# Patient Record
Sex: Female | Born: 1950
Health system: Southern US, Community
[De-identification: ages and names within clinical notes are randomized; demographics above are authoritative.]

## PROBLEM LIST (undated history)

## (undated) DIAGNOSIS — M1990 Unspecified osteoarthritis, unspecified site: Secondary | ICD-10-CM

## (undated) DIAGNOSIS — E119 Type 2 diabetes mellitus without complications: Secondary | ICD-10-CM

## (undated) DIAGNOSIS — I10 Essential (primary) hypertension: Secondary | ICD-10-CM

## (undated) DIAGNOSIS — Z8489 Family history of other specified conditions: Secondary | ICD-10-CM

## (undated) DIAGNOSIS — I890 Lymphedema, not elsewhere classified: Secondary | ICD-10-CM

## (undated) HISTORY — PX: CHOLECYSTECTOMY: SHX55

## (undated) HISTORY — DX: Unspecified osteoarthritis, unspecified site: M19.90

## (undated) HISTORY — PX: EYE SURGERY: SHX253

## (undated) HISTORY — PX: ABDOMINAL HYSTERECTOMY: SHX81

## (undated) HISTORY — PX: UTERINE FIBROID SURGERY: SHX826

## (undated) HISTORY — PX: OTHER SURGICAL HISTORY: SHX169

## (undated) HISTORY — PX: PARTIAL HYSTERECTOMY: SHX80

## (undated) HISTORY — PX: HERNIA REPAIR: SHX51

## (undated) HISTORY — DX: Type 2 diabetes mellitus without complications: E11.9

## (undated) LAB — COLOGUARD: Cologuard: NEGATIVE

---

## 2015-01-30 ENCOUNTER — Emergency Department (HOSPITAL_COMMUNITY)
Admission: EM | Admit: 2015-01-30 | Discharge: 2015-01-30 | Disposition: A | Discharge status: Home / Self Care | Payer: 59 | Source: Home / Self Care | Attending: Family Medicine | Admitting: Family Medicine

## 2015-01-30 ENCOUNTER — Encounter (HOSPITAL_COMMUNITY): Payer: Self-pay | Admitting: *Deleted

## 2015-01-30 DIAGNOSIS — I10 Essential (primary) hypertension: Secondary | ICD-10-CM

## 2015-01-30 HISTORY — DX: Essential (primary) hypertension: I10

## 2015-01-30 MED ORDER — LOSARTAN POTASSIUM-HCTZ 100-25 MG PO TABS: 1.0000 | ORAL_TABLET | Freq: Every day | ORAL | Status: DC

## 2015-01-30 NOTE — ED Provider Notes (Signed)
CSN: [id number]     Arrival date & time 01/30/15  35 History   First MD Initiated Contact with Patient 01/30/15 1816     Chief Complaint  Patient presents with  . Medication Refill   (Consider location/radiation/quality/duration/timing/severity/associated sxs/prior Treatment) Patient is a 64 y.o. female presenting with hypertension. The history is provided by the patient.  Hypertension This is a chronic problem. The current episode started yesterday. The problem has not changed since onset.Pertinent negatives include no chest pain, no abdominal pain, no headaches and no shortness of breath.    Past Medical History  Diagnosis Date  . Hypertension    History reviewed. No pertinent past surgical history. History reviewed. No pertinent family history. History  Substance Use Topics  . Smoking status: Never Smoker   . Smokeless tobacco: Not on file  . Alcohol Use: No   OB History    No data available     Review of Systems  Constitutional: Negative.   Respiratory: Negative for chest tightness and shortness of breath.   Cardiovascular: Negative for chest pain, palpitations and leg swelling.  Gastrointestinal: Negative for abdominal pain.  Neurological: Negative for headaches.    Allergies  Review of patient's allergies indicates no known allergies.  Home Medications   Prior to Admission medications   Medication Sig Start Date End Date Taking? Authorizing Provider  furosemide (LASIX) 40 MG tablet Take 40 mg by mouth.   Yes Historical Provider, MD  potassium chloride SA (K-DUR,KLOR-CON) 20 MEQ tablet Take 20 mEq by mouth 2 (two) times daily.   Yes Historical Provider, MD  losartan-hydrochlorothiazide (HYZAAR) 100-25 MG per tablet Take 1 tablet by mouth daily. 01/30/15   Billy Fischer, MD   BP 199/96 mmHg  Pulse 83  Temp(Src) 98.2 F (36.8 C) (Oral)  Resp 20  SpO2 95% Physical Exam  Constitutional: She is oriented to person, place, and time. She appears well-developed and  well-nourished. No distress.  Neck: Normal range of motion. Neck supple.  Cardiovascular: Normal rate, regular rhythm, normal heart sounds and intact distal pulses.   Pulmonary/Chest: Effort normal and breath sounds normal.  Lymphadenopathy:    She has no cervical adenopathy.  Neurological: She is alert and oriented to person, place, and time.  Skin: Skin is warm and dry.  Nursing note and vitals reviewed.   ED Course  Procedures (including critical care time) Labs Review Labs Reviewed - No data to display  Imaging Review No results found.   MDM   1. Essential hypertension        Billy Fischer, MD 01/30/15 [id number]

## 2015-01-30 NOTE — ED Notes (Signed)
Pt  Reports    She   Needs  A  Refill of  Her  meds  To include  Her  bp  meds    And  Her  Potassium      She  Has  No  Doctor  In  [place]      And  Her last  Dose  Of  meds  Was  Yesterday

## 2015-01-30 NOTE — Discharge Instructions (Signed)
See your doctor for further care of your bp.

## 2015-01-30 NOTE — ED Notes (Signed)
Pt   Requests  rx  Phoned  To    [facility] -  [place]     losartin    -  hctz       100 -25     Qty       30   1  Refill   eread  Back  And  verified

## 2015-02-15 ENCOUNTER — Ambulatory Visit (INDEPENDENT_AMBULATORY_CARE_PROVIDER_SITE_OTHER): Payer: 59 | Admitting: Family Medicine

## 2015-02-15 VITALS — BP 142/92 | HR 90 | Temp 98.8°F | Resp 17 | Ht 66.0 in | Wt 253.0 lb

## 2015-02-15 DIAGNOSIS — G8929 Other chronic pain: Secondary | ICD-10-CM | POA: Diagnosis not present

## 2015-02-15 DIAGNOSIS — I10 Essential (primary) hypertension: Secondary | ICD-10-CM | POA: Diagnosis not present

## 2015-02-15 DIAGNOSIS — M25561 Pain in right knee: Secondary | ICD-10-CM | POA: Diagnosis not present

## 2015-02-15 DIAGNOSIS — R609 Edema, unspecified: Secondary | ICD-10-CM

## 2015-02-15 DIAGNOSIS — M25569 Pain in unspecified knee: Secondary | ICD-10-CM

## 2015-02-15 DIAGNOSIS — B372 Candidiasis of skin and nail: Secondary | ICD-10-CM

## 2015-02-15 MED ORDER — FLUCONAZOLE 150 MG PO TABS: 150.0000 mg | ORAL_TABLET | Freq: Once | ORAL | Status: DC

## 2015-02-15 MED ORDER — POTASSIUM CHLORIDE CRYS ER 20 MEQ PO TBCR: 20.0000 meq | EXTENDED_RELEASE_TABLET | Freq: Every day | ORAL | Status: DC

## 2015-02-15 MED ORDER — FUROSEMIDE 40 MG PO TABS: 40.0000 mg | ORAL_TABLET | Freq: Every day | ORAL | Status: DC

## 2015-02-15 MED ORDER — NYSTATIN 100000 UNIT/GM EX CREA: 1.0000 "application " | TOPICAL_CREAM | Freq: Two times a day (BID) | CUTANEOUS | Status: DC

## 2015-02-15 NOTE — Progress Notes (Signed)
[facility] [address] [place] [address] [phone]  Date:  02/15/2015   Name:  Emily Wood   DOB:  1950/10/05   MRN:  [id number]  PCP:  [name], MD    Chief Complaint: Rash   History of Present Illness:  Emily Wood is a 64 y.o. very pleasant female patient who presents with the following:  She notes a rash under her breasts and in the inguinal folds for the last month.  It is itchy and seems to be turning her skin first red and now darker than normal.  She has had this in the past but it never this bad.   They recently moved to [facility] from [place] due to her job.  She will be seeing [place] for her PCP now  She does have some chronic health issues such as history of orthopedic surgery, HTN, and chronic LE swellilng  She was recently told to stop her lasix and her potassium pill- however she notes worsening of her chronic leg swelling since she stopped taking this.  Wonders if she can go back on  There are no active problems to display for this patient.   Past Medical History  Diagnosis Date  . Hypertension     No past surgical history on file.  History  Substance Use Topics  . Smoking status: Never Smoker   . Smokeless tobacco: Not on file  . Alcohol Use: No    No family history on file.  No Known Allergies  Medication list has been reviewed and updated.  Current Outpatient Prescriptions on File Prior to Visit  Medication Sig Dispense Refill  . furosemide (LASIX) 40 MG tablet Take 40 mg by mouth.    . losartan-hydrochlorothiazide (HYZAAR) 100-25 MG per tablet Take 1 tablet by mouth daily. 30 tablet 1  . potassium chloride SA (K-DUR,KLOR-CON) 20 MEQ tablet Take 20 mEq by mouth 2 (two) times daily.     No current facility-administered medications on file prior to visit.    Review of Systems:  As per HPI- otherwise negative.   Physical Examination: Filed Vitals:   02/15/15 1748  BP: 142/92  Pulse: 110  Temp:  98.8 F (37.1 C)  Resp: 17   Filed Vitals:   02/15/15 1748  Height: 5\' 6"  (1.676 m)  Weight: 253 lb (114.76 kg)   Body mass index is 40.85 kg/(m^2). Ideal Body Weight: Weight in (lb) to have BMI = 25: 154.6  GEN: WDWN, NAD, Non-toxic, A & O x 3, obese lady who appears well HEENT: Atraumatic, Normocephalic. Neck supple. No masses, No LAD. Ears and Nose: No external deformity. CV: RRR, No M/G/R. No JVD. No thrill. No extra heart sounds. PULM: CTA B, no wheezes, crackles, rhonchi. No retractions. No resp. distress. No accessory muscle use. EXTR: No c/c/e NEURO Normal gait.  PSYCH: Normally interactive. Conversant. Not depressed or anxious appearing.  Calm demeanor.  She has candida intertrigo of the inguinal folds and under her breasts  She also notes a likely lipoma over her left ribs which has been present for some time Swelling of her LE- the left ankle is quite swollen and shows signs of chronic edema.  She states this is baseline for her  Assessment and Plan: Candidal intertrigo - Plan: nystatin cream (MYCOSTATIN), fluconazole (DIFLUCAN) 150 MG tablet  Essential hypertension  Peripheral edema - Plan: furosemide (LASIX) 40 MG tablet, potassium chloride SA (K-DUR,KLOR-CON) 20 MEQ tablet  Chronic knee pain, right - Plan: Ambulatory  referral to Orthopedic Surgery  She has been trying to clear up her candida with supportive care at home.  Will treat with nystatin and diflucaqn Refilled medications for her HTN and chronic edema Referral to ortho to establish care She plans to see me in September for a recheck   Signed [name], MD

## 2015-02-15 NOTE — Patient Instructions (Signed)
You have candida intertrigo- this is a yeast infection of the skin, very common in skin folds in the summer months Try the nystatin cream, and take a diflucan once, repeat in one week Try to keep the area as cool and dry as you can! Let me know if not better and I will look forward to seeing you soon to establish care

## 2015-02-26 ENCOUNTER — Other Ambulatory Visit: Payer: Self-pay

## 2015-02-26 DIAGNOSIS — R609 Edema, unspecified: Secondary | ICD-10-CM

## 2015-02-26 MED ORDER — FUROSEMIDE 40 MG PO TABS: 40.0000 mg | ORAL_TABLET | Freq: Every day | ORAL | Status: DC

## 2015-02-26 MED ORDER — LOSARTAN POTASSIUM-HCTZ 100-25 MG PO TABS: 1.0000 | ORAL_TABLET | Freq: Every day | ORAL | Status: DC

## 2015-02-26 MED ORDER — POTASSIUM CHLORIDE CRYS ER 20 MEQ PO TBCR: 20.0000 meq | EXTENDED_RELEASE_TABLET | Freq: Every day | ORAL | Status: DC

## 2015-02-26 NOTE — Telephone Encounter (Signed)
[facility] faxed req for lasix, klor-con, and losartan/HCTZ. Sent the first two which Dr Lorelei Pont had sent in Rxs for locally. Dr Lorelei Pont, I'm sure you want to Rx the losartan also from your notes, but since you have not written before, I need to get OK from you.

## 2015-04-03 ENCOUNTER — Other Ambulatory Visit: Payer: Self-pay

## 2015-04-03 MED ORDER — LOSARTAN POTASSIUM-HCTZ 100-25 MG PO TABS: 1.0000 | ORAL_TABLET | Freq: Every day | ORAL | Status: DC

## 2015-04-18 ENCOUNTER — Telehealth: Payer: Self-pay | Admitting: Family Medicine

## 2015-04-18 NOTE — Telephone Encounter (Signed)
Returning patients call about her mammogram.  Left a message for her to return my call

## 2015-04-18 NOTE — Telephone Encounter (Signed)
Spoke with patient and discussed scheduling a mammogram.  She will call and schedule it at a time that is convient for her.

## 2015-04-19 ENCOUNTER — Ambulatory Visit (INDEPENDENT_AMBULATORY_CARE_PROVIDER_SITE_OTHER): Payer: 59

## 2015-04-19 ENCOUNTER — Ambulatory Visit (INDEPENDENT_AMBULATORY_CARE_PROVIDER_SITE_OTHER): Payer: 59 | Admitting: Family Medicine

## 2015-04-19 VITALS — BP 170/90 | HR 95 | Temp 98.6°F | Ht 65.75 in | Wt 254.0 lb

## 2015-04-19 DIAGNOSIS — M25551 Pain in right hip: Secondary | ICD-10-CM

## 2015-04-19 DIAGNOSIS — M7061 Trochanteric bursitis, right hip: Secondary | ICD-10-CM

## 2015-04-19 DIAGNOSIS — Z130 Encounter for screening for diseases of the blood and blood-forming organs and certain disorders involving the immune mechanism: Secondary | ICD-10-CM | POA: Diagnosis not present

## 2015-04-19 DIAGNOSIS — R600 Localized edema: Secondary | ICD-10-CM | POA: Diagnosis not present

## 2015-04-19 DIAGNOSIS — I10 Essential (primary) hypertension: Secondary | ICD-10-CM

## 2015-04-19 DIAGNOSIS — Z131 Encounter for screening for diabetes mellitus: Secondary | ICD-10-CM

## 2015-04-19 MED ORDER — METOPROLOL TARTRATE 50 MG PO TABS: 50.0000 mg | ORAL_TABLET | Freq: Two times a day (BID) | ORAL | Status: DC

## 2015-04-19 MED ORDER — METHYLPREDNISOLONE ACETATE 80 MG/ML IJ SUSP: 40.0000 mg | Freq: Once | INTRAMUSCULAR | Status: DC

## 2015-04-19 NOTE — Patient Instructions (Signed)
I hope that the injection your got today (depo-medrol and lidocaine) will help with your hip pain Do mention this to your orthopedist when you see them for follow-up next week We will be in touch with your labs asap Please add metoprolol to your blood pressure regimen- start with 1/2 tablet (25mg ) twice a day.  Assuming you do not have any side effects we can go to a whole tablet  Please see me in about one month for a recheck- sooner if any concenns

## 2015-04-19 NOTE — Progress Notes (Signed)
[facility] [address] [place] [address] [phone]  Date:  04/19/2015   Name:  Emily Wood   DOB:  [birth year]   MRN:  [id number]  PCP:  [name], MD    Chief Complaint: Hip Pain; Hypertension; and Stress   History of Present Illness:  Emily Wood is a 64 y.o. very pleasant female patient who presents with the following:  She has noted a pain in her right hip for about one month- it is worst in the morning, makes it really hard for her to get out of bed. It will then improve once she gets going, and her hip does not hurt much during the day.   She is not aware of any injury or overuse  She did see ortho for her knee OA- she got a steroid shot in her knee at the end of August.  She has a follow-up on 10/4.  The shot did help her a lot.  She notes good improvement  She has been faithfully taking her BP medications.  She does not have any blurred vision, she is not having any unusual HA.  However she is concerned that her BP continues to run high.  In addition, she has significant chronic pedal edema.  Admits that she does not wear compression stockings.  She does use laxix and HCTZ  No recent labs She is not aware of any other cardiac history   Pulse Readings from Last 3 Encounters:  04/19/15 95  02/15/15 90  01/30/15 83     BP Readings from Last 3 Encounters:  04/19/15 170/90  02/15/15 142/92  01/30/15 199/96     Patient Active Problem List   Diagnosis Date Noted  . Essential hypertension 02/15/2015  . Peripheral edema 02/15/2015  . Chronic knee pain 02/15/2015    Past Medical History  Diagnosis Date  . Hypertension     No past surgical history on file.  Social History  Substance Use Topics  . Smoking status: Never Smoker   . Smokeless tobacco: Never Used  . Alcohol Use: No    No family history on file.  No Known Allergies  Medication list has been reviewed and updated.  Current Outpatient Prescriptions  on File Prior to Visit  Medication Sig Dispense Refill  . aspirin 81 MG tablet Take 81 mg by mouth daily.    . furosemide (LASIX) 40 MG tablet Take 1 tablet (40 mg total) by mouth daily. 90 tablet 1  . losartan-hydrochlorothiazide (HYZAAR) 100-25 MG per tablet Take 1 tablet by mouth daily. 90 tablet 3  . potassium chloride SA (K-DUR,KLOR-CON) 20 MEQ tablet Take 1 tablet (20 mEq total) by mouth daily. 90 tablet 1   No current facility-administered medications on file prior to visit.    Review of Systems:  As per HPI- otherwise negative.   Physical Examination: Filed Vitals:   04/19/15 1809  BP: 170/90  Pulse:   Temp:    Filed Vitals:   04/19/15 1805  Height: 5' 5.75" (1.67 m)  Weight: 254 lb (115.214 kg)   Body mass index is 41.31 kg/(m^2). Ideal Body Weight: Weight in (lb) to have BMI = 25: 153.4  GEN: WDWN, NAD, Non-toxic, A & O x 3, obese, looks well HEENT: Atraumatic, Normocephalic. Neck supple. No masses, No LAD. Ears and Nose: No external deformity. CV: RRR, No M/G/R. No JVD. No thrill. No extra heart sounds. PULM: CTA B, no wheezes, crackles, rhonchi. No retractions. No resp. distress. No  accessory muscle use. ABD: S, NT, ND, +BS. No rebound. No HSM. EXTR: No c/c NEURO Normal gait.  PSYCH: Normally interactive. Conversant. Not depressed or anxious appearing.  Calm demeanor.  She has chronic appearing edema of both LE to the knees.  No pitting noted Right hip: She has tenderness over the right greater trochanter.  She does appear to have a lipoma under the skin just superior to the trochanter, but this is non- tender.  No pain with movement of the hip joint.  No redness or heat  [facility] reading (PRIMARY) by  Dr. Lorelei Pont. Right FOY:DXAJOINO for any acute disease    DG HIP (WITH OR WITHOUT PELVIS) 2-3V RIGHT  COMPARISON: None.  FINDINGS: No acute fracture, subluxation or dislocation.  The joint space is unremarkable. No focal bony lesions are  identified. IMPRESSION: Negative.  VC obtained.  Prepped right hip at point of maximum tenderness over GT with betadine and alcohol, used ethyl chloride spray for anesthesia Then injected 24ml of 1% lido and 40mg  of depomedrol into right trochanteric bursa.  Pt tolerated well Assessment and Plan: Trochanteric bursitis of right hip - Plan: methylPREDNISolone acetate (DEPO-MEDROL) injection 40 mg  Pain in right hip - Plan: DG HIP UNILAT W OR W/O PELVIS 2-3 VIEWS RIGHT  Screening for diabetes mellitus - Plan: Hemoglobin A1c  Screening for deficiency anemia - Plan: CBC  Essential hypertension - Plan: Comprehensive metabolic panel, metoprolol (LOPRESSOR) 50 MG tablet, DISCONTINUED: metoprolol (LOPRESSOR) 50 MG tablet  Pedal edema - Plan: Ambulatory referral to Vascular Surgery  Injection for trochanteric bursitis today.  We hope this will help with her hip pain.  She is seeing ortho next week.  Films negative Await labs as above BP is not controlled- avoid CCB due to her edema.  Will add a BB, starting with a low dose Referral to vascular for her chronic edema- suspect is due to venous insuf and compression will be helpful  Meds ordered this encounter  Medications  . DISCONTD: metoprolol (LOPRESSOR) 50 MG tablet    Sig: Take 1 tablet (50 mg total) by mouth 2 (two) times daily.    Dispense:  180 tablet    Refill:  3  . metoprolol (LOPRESSOR) 50 MG tablet    Sig: Take 1 tablet (50 mg total) by mouth 2 (two) times daily.    Dispense:  60 tablet    Refill:  3  . methylPREDNISolone acetate (DEPO-MEDROL) injection 40 mg    Sig:      Signed [name], MD

## 2015-04-20 LAB — COMPREHENSIVE METABOLIC PANEL
ALBUMIN: 4.3 g/dL (ref 3.6–5.1)
ALT: 21 U/L (ref 6–29)
AST: 23 U/L (ref 10–35)
Alkaline Phosphatase: 118 U/L (ref 33–130)
BUN: 19 mg/dL (ref 7–25)
CO2: 25 mmol/L (ref 20–31)
Calcium: 9.4 mg/dL (ref 8.6–10.4)
Chloride: 96 mmol/L — ABNORMAL LOW (ref 98–110)
Creat: 0.96 mg/dL (ref 0.50–0.99)
Glucose, Bld: 132 mg/dL — ABNORMAL HIGH (ref 65–99)
POTASSIUM: 5.4 mmol/L — AB (ref 3.5–5.3)
Sodium: 135 mmol/L (ref 135–146)
TOTAL PROTEIN: 8.4 g/dL — AB (ref 6.1–8.1)
Total Bilirubin: 0.8 mg/dL (ref 0.2–1.2)

## 2015-04-20 LAB — CBC
HCT: 36.1 % (ref 36.0–46.0)
Hemoglobin: 12.4 g/dL (ref 12.0–15.0)
MCH: 28.6 pg (ref 26.0–34.0)
MCHC: 34.3 g/dL (ref 30.0–36.0)
MCV: 83.2 fL (ref 78.0–100.0)
MPV: 9.6 fL (ref 8.6–12.4)
Platelets: 428 10*3/uL — ABNORMAL HIGH (ref 150–400)
RBC: 4.34 MIL/uL (ref 3.87–5.11)
RDW: 12.8 % (ref 11.5–15.5)
WBC: 9.7 10*3/uL (ref 4.0–10.5)

## 2015-04-20 LAB — HEMOGLOBIN A1C
HEMOGLOBIN A1C: 9.5 % — AB (ref <5.7)
MEAN PLASMA GLUCOSE: 226 mg/dL — AB (ref <117)

## 2015-04-22 ENCOUNTER — Telehealth: Payer: Self-pay

## 2015-04-22 ENCOUNTER — Encounter: Payer: Self-pay | Admitting: Family Medicine

## 2015-04-22 DIAGNOSIS — E875 Hyperkalemia: Secondary | ICD-10-CM

## 2015-04-22 DIAGNOSIS — E119 Type 2 diabetes mellitus without complications: Secondary | ICD-10-CM

## 2015-04-22 MED ORDER — METFORMIN HCL 500 MG PO TABS: 500.0000 mg | ORAL_TABLET | Freq: Two times a day (BID) | ORAL | Status: DC

## 2015-04-22 NOTE — Telephone Encounter (Signed)
Pt requesting call back from dr copland re she is not any better!   Best phone [phone]

## 2015-04-22 NOTE — Telephone Encounter (Signed)
Assessment and Plan: Trochanteric bursitis of right hip - Plan: methylPREDNISolone acetate (DEPO-MEDROL) injection 40 mg  Pain in right hip - Plan: DG HIP UNILAT W OR W/O PELVIS 2-3 VIEWS RIGHT  Screening for diabetes mellitus - Plan: Hemoglobin A1c  Screening for deficiency anemia - Plan: CBC  Essential hypertension - Plan: Comprehensive metabolic panel, metoprolol (LOPRESSOR) 50 MG tablet, DISCONTINUED: metoprolol (LOPRESSOR) 50 MG tablet  Pedal edema - Plan: Ambulatory referral to Vascular Surgery  Injection for trochanteric bursitis today. We hope this will help with her hip pain. She is seeing ortho next week. Films negative Await labs as above BP is not controlled- avoid CCB due to her edema. Will add a BB, starting with a low dose Referral to vascular for her chronic edema- suspect is due to venous insuf and compression will be helpful

## 2015-04-22 NOTE — Telephone Encounter (Signed)
Called her back- needed to call her to discuss her recent labs in any case.  She is a pt at [facility],  Dr. Cay Schillings.  She is actually seeing him tomorrow.  Advised her to let him know that her hip has been bothering her, that injection for bursitis did not help.  Advised her that he may not be able to address this on the same day - he may have to bring her back She does have DM- start on metformin 500 mg twice a day. Go over the ADA website for diet and self- care information. Please see me in 2 months to follow-up K slightly high- she is taking K replacement.  Asked her to hold this for 2-3 days and RTC for a K level only- she will do os  Results for orders placed or performed in visit on 04/19/15  CBC  Result Value Ref Range   WBC 9.7 4.0 - 10.5 K/uL   RBC 4.34 3.87 - 5.11 MIL/uL   Hemoglobin 12.4 12.0 - 15.0 g/dL   HCT 36.1 36.0 - 46.0 %   MCV 83.2 78.0 - 100.0 fL   MCH 28.6 26.0 - 34.0 pg   MCHC 34.3 30.0 - 36.0 g/dL   RDW 12.8 11.5 - 15.5 %   Platelets 428 (H) 150 - 400 K/uL   MPV 9.6 8.6 - 12.4 fL  Comprehensive metabolic panel  Result Value Ref Range   Sodium 135 135 - 146 mmol/L   Potassium 5.4 (H) 3.5 - 5.3 mmol/L   Chloride 96 (L) 98 - 110 mmol/L   CO2 25 20 - 31 mmol/L   Glucose, Bld 132 (H) 65 - 99 mg/dL   BUN 19 7 - 25 mg/dL   Creat 0.96 0.50 - 0.99 mg/dL   Total Bilirubin 0.8 0.2 - 1.2 mg/dL   Alkaline Phosphatase 118 33 - 130 U/L   AST 23 10 - 35 U/L   ALT 21 6 - 29 U/L   Total Protein 8.4 (H) 6.1 - 8.1 g/dL   Albumin 4.3 3.6 - 5.1 g/dL   Calcium 9.4 8.6 - 10.4 mg/dL  Hemoglobin A1c  Result Value Ref Range   Hgb A1c MFr Bld 9.5 (H) <5.7 %   Mean Plasma Glucose 226 (H) <117 mg/dL

## 2015-04-25 ENCOUNTER — Telehealth: Payer: Self-pay

## 2015-04-25 NOTE — Telephone Encounter (Signed)
Pt has questions about her medication bp meds is causing her headaches

## 2015-04-26 ENCOUNTER — Other Ambulatory Visit (INDEPENDENT_AMBULATORY_CARE_PROVIDER_SITE_OTHER): Payer: 59

## 2015-04-26 DIAGNOSIS — E875 Hyperkalemia: Secondary | ICD-10-CM | POA: Diagnosis not present

## 2015-04-26 NOTE — Progress Notes (Signed)
Pt here for lab draw only  

## 2015-04-26 NOTE — Telephone Encounter (Signed)
Left message for pt to call back  °

## 2015-04-27 LAB — POTASSIUM: Potassium: 3.7 mmol/L (ref 3.5–5.3)

## 2015-04-29 ENCOUNTER — Telehealth: Payer: Self-pay

## 2015-04-29 NOTE — Telephone Encounter (Signed)
Called her back- her repeat K is normal, I would recommend that she take the metformin as her A1c is 9.5% She understands and will do so- we will plan to recheck in about 3 months Also, she is taking just 1/2 of her lopressor due to headache with a whole pill.  She will continue this regimen and will try to check her BP so we can see how she is running on this dose

## 2015-04-29 NOTE — Telephone Encounter (Signed)
Pt is wanting to talk with her provider about waiting on the diabetes meds and giving it a month to watch what she its and then see what glucose is and see if she needs the meds

## 2015-05-01 ENCOUNTER — Ambulatory Visit: Payer: 59 | Admitting: Family Medicine

## 2015-05-08 ENCOUNTER — Ambulatory Visit (INDEPENDENT_AMBULATORY_CARE_PROVIDER_SITE_OTHER): Payer: 59 | Admitting: Family Medicine

## 2015-05-08 ENCOUNTER — Encounter: Payer: Self-pay | Admitting: Family Medicine

## 2015-05-08 ENCOUNTER — Telehealth: Payer: Self-pay | Admitting: Family Medicine

## 2015-05-08 VITALS — BP 152/88 | HR 71 | Temp 98.1°F | Resp 16 | Ht 65.75 in | Wt 246.4 lb

## 2015-05-08 DIAGNOSIS — E1165 Type 2 diabetes mellitus with hyperglycemia: Secondary | ICD-10-CM

## 2015-05-08 DIAGNOSIS — Z5181 Encounter for therapeutic drug level monitoring: Secondary | ICD-10-CM | POA: Diagnosis not present

## 2015-05-08 DIAGNOSIS — Z23 Encounter for immunization: Secondary | ICD-10-CM

## 2015-05-08 DIAGNOSIS — IMO0001 Reserved for inherently not codable concepts without codable children: Secondary | ICD-10-CM

## 2015-05-08 DIAGNOSIS — Z1231 Encounter for screening mammogram for malignant neoplasm of breast: Secondary | ICD-10-CM

## 2015-05-08 DIAGNOSIS — M25551 Pain in right hip: Secondary | ICD-10-CM

## 2015-05-08 DIAGNOSIS — I10 Essential (primary) hypertension: Secondary | ICD-10-CM

## 2015-05-08 LAB — BASIC METABOLIC PANEL
BUN: 22 mg/dL (ref 7–25)
CHLORIDE: 101 mmol/L (ref 98–110)
CO2: 27 mmol/L (ref 20–31)
CREATININE: 0.98 mg/dL (ref 0.50–0.99)
Calcium: 9.4 mg/dL (ref 8.6–10.4)
Glucose, Bld: 91 mg/dL (ref 65–99)
Potassium: 3.9 mmol/L (ref 3.5–5.3)
Sodium: 138 mmol/L (ref 135–146)

## 2015-05-08 LAB — GLUCOSE, POCT (MANUAL RESULT ENTRY): POC GLUCOSE: 96 mg/dL (ref 70–99)

## 2015-05-08 MED ORDER — TRAMADOL HCL 50 MG PO TABS: 50.0000 mg | ORAL_TABLET | Freq: Three times a day (TID) | ORAL | Status: DC | PRN

## 2015-05-08 MED ORDER — CYCLOBENZAPRINE HCL 10 MG PO TABS: 10.0000 mg | ORAL_TABLET | Freq: Two times a day (BID) | ORAL | Status: DC | PRN

## 2015-05-08 NOTE — Progress Notes (Addendum)
[facility] [address] [place] [address] [phone]  Date:  05/08/2015   Name:  Emily Wood   DOB:  Sep 09, 1950   MRN:  [id number]  PCP:  [name], MD    Chief Complaint: right hip pain   History of Present Illness:  Emily Wood is a 64 y.o. very pleasant female patient who presents with the following:  I saw this pt about 3 weeks ago for presumed trochanteric bursitis.  Did a steroid injection but it did not help at all X-ray negative at that visit  DG HIP (WITH OR WITHOUT PELVIS) 2-3V RIGHT COMPARISON: None. FINDINGS: No acute fracture, subluxation or dislocation. The joint space is unremarkable. No focal bony lesions are identified. IMPRESSION: Negative.  We also dx her with DM at last visit, started on metformin.  She is a pt of Dr. Cay Schillings at [facility] ortho She states that today her right hip pain was a lot worse and she was having a hard time walking.  She does have some sort of lump that has been present for a few years  She does not have an appt to see Dr. Cay Schillings in follow-up; she seems confused about if she has the proper referral for this FU appt or not  Patient Active Problem List   Diagnosis Date Noted  . Essential hypertension 02/15/2015  . Peripheral edema 02/15/2015  . Chronic knee pain 02/15/2015    Past Medical History  Diagnosis Date  . Hypertension     No past surgical history on file.  Social History  Substance Use Topics  . Smoking status: Never Smoker   . Smokeless tobacco: Never Used  . Alcohol Use: No    No family history on file.  No Known Allergies  Medication list has been reviewed and updated.  Current Outpatient Prescriptions on File Prior to Visit  Medication Sig Dispense Refill  . aspirin 81 MG tablet Take 81 mg by mouth daily.    . furosemide (LASIX) 40 MG tablet Take 1 tablet (40 mg total) by mouth daily. 90 tablet 1  . metFORMIN (GLUCOPHAGE) 500 MG tablet Take 1 tablet  (500 mg total) by mouth 2 (two) times daily with a meal. 180 tablet 3  . metoprolol (LOPRESSOR) 50 MG tablet Take 1 tablet (50 mg total) by mouth 2 (two) times daily. 60 tablet 3  . losartan-hydrochlorothiazide (HYZAAR) 100-25 MG per tablet Take 1 tablet by mouth daily. (Patient not taking: Reported on 05/08/2015) 90 tablet 3  . potassium chloride SA (K-DUR,KLOR-CON) 20 MEQ tablet Take 1 tablet (20 mEq total) by mouth daily. (Patient not taking: Reported on 05/08/2015) 90 tablet 1   Current Facility-Administered Medications on File Prior to Visit  Medication Dose Route Frequency Provider Last Rate Last Dose  . methylPREDNISolone acetate (DEPO-MEDROL) injection 40 mg  40 mg Intra-articular Once Darreld Mclean, MD        Review of Systems:  As per HPI- otherwise negative.  Physical Examination: Filed Vitals:   05/08/15 1438  BP: 152/88  Pulse: 71  Temp: 98.1 F (36.7 C)  Resp: 16   Filed Vitals:   05/08/15 1438  Height: 5' 5.75" (1.67 m)  Weight: 246 lb 6.4 oz (111.766 kg)   Body mass index is 40.08 kg/(m^2). Ideal Body Weight: Weight in (lb) to have BMI = 25: 153.4  GEN: WDWN, NAD, Non-toxic, A & O x 3, obese, looks well HEENT: Atraumatic, Normocephalic. Neck supple. No masses, No LAD. Ears and  Nose: No external deformity. CV: RRR, No M/G/R. No JVD. No thrill. No extra heart sounds. PULM: CTA B, no wheezes, crackles, rhonchi. No retractions. No resp. distress. No accessory muscle use. ABD: S, NT, ND EXTR: No c/c/e NEURO favoring the right hip a lot today PSYCH: Normally interactive. Conversant. Not depressed or anxious appearing.  Calm demeanor.  Right hip: she has a 1/2 softball sized likely lipoma over the lateral right hip.  She is still tender over the greater trochanter, and over the superiolateral buttock.  No pain with internal and external rotation of the hip. The actual hip joint is not painful to her  Results for orders placed or performed in visit on 05/08/15   POCT glucose (manual entry)  Result Value Ref Range   POC Glucose 96 70 - 99 mg/dl    Assessment and Plan: Right hip pain - Plan: traMADol (ULTRAM) 50 MG tablet, cyclobenzaprine (FLEXERIL) 10 MG tablet  Flu vaccine need - Plan: Flu Vaccine QUAD 36+ mos IM  Uncontrolled type 2 diabetes mellitus without complication, without long-term current use of insulin ([place]) - Plan: POCT glucose (manual entry), Pneumococcal polysaccharide vaccine 23-valent greater than or equal to 2yo subcutaneous/IM  Medication monitoring encounter - Plan: Basic metabolic panel  Her glucose control seems better Too early to recheck her A1c but will do this soon Her right hip is really bothering her today Recent x-rays normal.   rx for tramadol and flexeril to use as needed, and make her an appt with her ortho this coming Saturday Discussed risk of sedation with meds today, more risk if used in combination Also checked a BMP to see how her K looks today- may be able to add back in another BP medication Signed [name], MD  Received her BMP on 10/20- it looks fine.  Right now she is just taking lasix and lopressor, not taking her losartan/hctz or her k dur.  Her K looks ok, but her BP is running a bit high.  Called and [facility]- will try back later.  May want to add back in losartan  Called and [facility] again 10/24 Spoke with her on 10/25. She has not noted any worsening of her edema since stopping the hctz.  Will add back 50 mg of losartan. She will watch her BP Discussed diet management of her DM.  She is really working on this and we hope to see an improved A1c at our next visit in January  BP Readings from Last 3 Encounters:  05/08/15 152/88  04/19/15 170/90  02/15/15 142/92   Lab Results  Component Value Date   HGBA1C 9.5* 04/19/2015    Results for orders placed or performed in visit on 96/22/29  Basic metabolic panel  Result Value Ref Range   Sodium 138 135 - 146 mmol/L   Potassium 3.9 3.5 - 5.3  mmol/L   Chloride 101 98 - 110 mmol/L   CO2 27 20 - 31 mmol/L   Glucose, Bld 91 65 - 99 mg/dL   BUN 22 7 - 25 mg/dL   Creat 0.98 0.50 - 0.99 mg/dL   Calcium 9.4 8.6 - 10.4 mg/dL  POCT glucose (manual entry)  Result Value Ref Range   POC Glucose 96 70 - 99 mg/dl

## 2015-05-08 NOTE — Patient Instructions (Signed)
Use the tramadol as needed for pain- this can make you a bit sleepy! You can also use the flexeril as needed for pain but I would recommend that you take this just at night.    Your glucose looks good today!   You have an appt with Dr. Cay Schillings this Saturday 10/22 at 8:30 am Please see me in about 3 months to recheck your diabetes

## 2015-05-08 NOTE — Telephone Encounter (Signed)
I had seen her about 3 weeks ago and we tried an injection for trochanteric bursitis. She had called back a few days later because it did not seem to help- I recommended that she discuss with her ortho MD.   Hulen Skains her but did not reach. LMOM; would she like to come in, see Dr. Cay Schillings or perhaps I can call in tramadol

## 2015-05-08 NOTE — Telephone Encounter (Signed)
Patient states that she is at work and her hip is in terrible pain. She wants to speak with Dr. Lorelei Pont about this.   [phone]

## 2015-05-08 NOTE — Telephone Encounter (Signed)
Pt ended up coming in to be seen today 

## 2015-05-09 ENCOUNTER — Telehealth: Payer: Self-pay

## 2015-05-09 NOTE — Telephone Encounter (Signed)
rxs called in and pt notified

## 2015-05-09 NOTE — Telephone Encounter (Signed)
Patient needs her scripts from yesterday [facility].  They don't have them according to patient.  States she was given the scripts on paper and requested we fax them for her.   [phone] (M)

## 2015-05-13 ENCOUNTER — Telehealth: Payer: Self-pay | Admitting: Family Medicine

## 2015-05-13 NOTE — Addendum Note (Signed)
Addended by: Wyatt Haste on: 05/13/2015 10:18 AM   Modules accepted: Orders

## 2015-05-14 ENCOUNTER — Encounter: Payer: Self-pay | Admitting: Family Medicine

## 2015-05-14 ENCOUNTER — Telehealth: Payer: Self-pay

## 2015-05-14 MED ORDER — LOSARTAN POTASSIUM 50 MG PO TABS: 50.0000 mg | ORAL_TABLET | Freq: Every day | ORAL | Status: DC

## 2015-05-14 NOTE — Telephone Encounter (Signed)
Taken care of, documented on recent OV note

## 2015-05-14 NOTE — Telephone Encounter (Signed)
Pt states Dr Lorelei Pont had called and left a message regarding her bp pills but she didn't quite understand what was said. Please call [phone]

## 2015-05-14 NOTE — Addendum Note (Signed)
Addended by: [name] C on: 05/14/2015 12:31 PM   Modules accepted: Orders, Medications

## 2015-05-15 NOTE — Addendum Note (Signed)
Addended by: Wyatt Haste on: 05/15/2015 09:18 AM   Modules accepted: Orders

## 2015-05-17 ENCOUNTER — Telehealth: Payer: Self-pay

## 2015-05-17 DIAGNOSIS — I10 Essential (primary) hypertension: Secondary | ICD-10-CM

## 2015-05-17 DIAGNOSIS — E119 Type 2 diabetes mellitus without complications: Secondary | ICD-10-CM

## 2015-05-17 NOTE — Telephone Encounter (Signed)
Patient is calling to see if we received a fax from [facility]. Patient needs a refill or metformin,lasartan, and metroprozol. Patient states that she needs her medication today because of insurance complication but I informed patient that it takes 24-72 hours for approval Patient would like a phone call! [phone]

## 2015-05-20 MED ORDER — METOPROLOL TARTRATE 50 MG PO TABS: 50.0000 mg | ORAL_TABLET | Freq: Two times a day (BID) | ORAL | Status: DC

## 2015-05-20 MED ORDER — METFORMIN HCL 500 MG PO TABS: 500.0000 mg | ORAL_TABLET | Freq: Two times a day (BID) | ORAL | Status: DC

## 2015-05-20 MED ORDER — LOSARTAN POTASSIUM 50 MG PO TABS: 50.0000 mg | ORAL_TABLET | Freq: Every day | ORAL | Status: DC

## 2015-05-20 NOTE — Telephone Encounter (Signed)
Sent in RFs. Sent a yr of RFs on metoprolol too since Dr Lorelei Pont filled all her other chronic meds for a year. Notified pt on VM.

## 2015-05-22 ENCOUNTER — Other Ambulatory Visit: Payer: Self-pay | Admitting: *Deleted

## 2015-05-22 ENCOUNTER — Encounter: Payer: Self-pay | Admitting: Vascular Surgery

## 2015-05-22 DIAGNOSIS — R600 Localized edema: Secondary | ICD-10-CM

## 2015-05-29 ENCOUNTER — Telehealth: Payer: Self-pay

## 2015-05-29 NOTE — Telephone Encounter (Signed)
Caren Griffins from [facility] called yesterday to get a [facility] referral for the patient's upcoming office visit.  The patient's insurance is showing as termed on 05/20/15, which I relayed to a rep in the office today.  She said she would contact the patient to ask if she had new insurance.  Sending this message to referrals for documentation purposes.

## 2015-06-21 ENCOUNTER — Encounter: Payer: Self-pay | Admitting: Vascular Surgery

## 2015-06-28 ENCOUNTER — Ambulatory Visit: Payer: 59

## 2015-06-28 ENCOUNTER — Encounter: Payer: Self-pay | Admitting: Vascular Surgery

## 2015-06-28 ENCOUNTER — Ambulatory Visit (HOSPITAL_COMMUNITY)
Admission: RE | Admit: 2015-06-28 | Discharge: 2015-06-28 | Disposition: A | Discharge status: Home / Self Care | Payer: 59 | Source: Ambulatory Visit | Attending: Vascular Surgery | Admitting: Vascular Surgery

## 2015-06-28 ENCOUNTER — Ambulatory Visit (INDEPENDENT_AMBULATORY_CARE_PROVIDER_SITE_OTHER): Payer: 59 | Admitting: Vascular Surgery

## 2015-06-28 VITALS — BP 172/70 | HR 69 | Temp 97.8°F | Resp 16 | Ht 65.5 in | Wt 218.0 lb

## 2015-06-28 DIAGNOSIS — R600 Localized edema: Secondary | ICD-10-CM | POA: Diagnosis not present

## 2015-06-28 DIAGNOSIS — I872 Venous insufficiency (chronic) (peripheral): Secondary | ICD-10-CM | POA: Diagnosis not present

## 2015-06-28 DIAGNOSIS — R609 Edema, unspecified: Secondary | ICD-10-CM

## 2015-06-28 NOTE — Progress Notes (Signed)
Referred by:  Darreld Mclean, MD [address] [place], [place] [address]  Reason for referral: B leg swelling   History of Present Illness  Emily Wood is a 64 y.o. ([birth year]) female who presents with chief complaint: B leg swelling.  Patient notes, onset of swelling year ago without obvious trigger.  The patient's symptoms include: L thigh swelling and new rash on lateral thigh.  She notes some proximal thigh burning sensation mixed with para/anesthsia.  The patient has had no history of DVT, mp history of pregnancy, mp history of varicose vein, mp history of venous stasis ulcers, mp history of  Lymphedema and mp history of skin changes in lower legs.  There is mp family history of venous disorders.  The patient has never used compression stockings in the past.   Past Medical History  Diagnosis Date  . Hypertension   . Diabetes mellitus without complication [facility])    Surgical History: denies prior operations  Social History   Social History  . Marital Status: Married    Spouse Name: N/A  . Number of Children: N/A  . Years of Education: N/A   Occupational History  . Not on file.   Social History Main Topics  . Smoking status: Never Smoker   . Smokeless tobacco: Never Used  . Alcohol Use: No  . Drug Use: No  . Sexual Activity: Not on file   Other Topics Concern  . Not on file   Social History Narrative    Family History: unable to detail parents' medical history   Current Outpatient Prescriptions  Medication Sig Dispense Refill  . aspirin 81 MG tablet Take 81 mg by mouth daily.    . cyclobenzaprine (FLEXERIL) 10 MG tablet Take 1 tablet (10 mg total) by mouth 2 (two) times daily as needed for muscle spasms. 30 tablet 0  . furosemide (LASIX) 40 MG tablet Take 1 tablet (40 mg total) by mouth daily. 90 tablet 1  . losartan (COZAAR) 50 MG tablet Take 1 tablet (50 mg total) by mouth daily. 90 tablet 3  . metFORMIN (GLUCOPHAGE) 500 MG tablet Take 1 tablet (500  mg total) by mouth 2 (two) times daily with a meal. 180 tablet 3  . potassium chloride SA (K-DUR,KLOR-CON) 20 MEQ tablet Take 1 tablet (20 mEq total) by mouth daily. 90 tablet 1  . metoprolol (LOPRESSOR) 50 MG tablet Take 1 tablet (50 mg total) by mouth 2 (two) times daily. 180 tablet 3  . traMADol (ULTRAM) 50 MG tablet Take 1 tablet (50 mg total) by mouth every 8 (eight) hours as needed. May take 2 if needed (Patient not taking: Reported on 06/28/2015) 40 tablet 0   Current Facility-Administered Medications  Medication Dose Route Frequency Provider Last Rate Last Dose  . methylPREDNISolone acetate (DEPO-MEDROL) injection 40 mg  40 mg Intra-articular Once Gay Filler Copland, MD        No Known Allergies   REVIEW OF SYSTEMS:  (Positives checked otherwise negative)  CARDIOVASCULAR:   [ ]  chest pain,  [ ]  chest pressure,  [ ]  palpitations,  [ ]  shortness of breath when laying flat,  [ ]  shortness of breath with exertion,   [ ]  pain in feet when walking,  [ ]  pain in feet when laying flat, [ ]  history of blood clot in veins (DVT),  [ ]  history of phlebitis,  [ ]  swelling in legs,  [ ]  varicose veins  PULMONARY:   [ ]  productive cough,  [ ]   asthma,  [ ]  wheezing  NEUROLOGIC:   [ ]  weakness in arms or legs,  [ ]  numbness in arms or legs,  [ ]  difficulty speaking or slurred speech,  [ ]  temporary loss of vision in one eye,  [ ]  dizziness  HEMATOLOGIC:   [ ]  bleeding problems,  [ ]  problems with blood clotting too easily  MUSCULOSKEL:   [ ]  joint pain, [ ]  joint swelling  GASTROINTEST:   [ ]  vomiting blood,  [ ]  blood in stool     GENITOURINARY:   [ ]  burning with urination,  [ ]  blood in urine  PSYCHIATRIC:   [ ]  history of major depression  INTEGUMENTARY:   [ ]  rashes,  [ ]  ulcers  CONSTITUTIONAL:   [ ]  fever,  [ ]  chills   Physical Examination  Filed Vitals:   06/28/15 0957 06/28/15 1004  BP: 165/71 172/70  Pulse: 71 69  Temp: 97.8 F (36.6 C)    TempSrc: Oral   Resp: 16   Height: 5' 5.5" (1.664 m)   Weight: 218 lb (98.884 kg)   SpO2: 100%    Body mass index is 35.71 kg/(m^2).  General: A&O x 3, WD, Obese,   Head: [place]/AT  Ear/Nose/Throat: Hearing grossly intact, nares without erythema or drainage, oropharynx without Erythema/Exudate, Mallampati score: 3  Eyes: PERRLA, EOMI  Neck: Supple, no nuchal rigidity, no palpable LAD  Pulmonary: Sym exp, good air movt, CTAB, no rales, rhonchi, & wheezing  Cardiac: RRR, Nl S1, S2, no Murmurs, rubs or gallops,  Vascular: Vessel Right Left  Radial Palpable Palpable  Brachial Palpable Palpable  Carotid Palpable, without bruit Palpable, without bruit  Aorta Not palpable N/A  Femoral Palpable Palpable  Popliteal Not palpable Not palpable  PT Not Palpable Not Palpable  DP Palpable Palpable   Gastrointestinal: soft, NTND, no G/R, no HSM, no masses, no CVAT B  Musculoskeletal: M/S 5/5 throughout , Extremities without ischemic changes , lipedema fat distribution, no varicosities evident, chronic venous skin changes with LDS B ankles, BLE 1-2+ edema  Neurologic: CN 2-12 intact , Pain and light touch intact in extremities , Motor exam as listed above  Psychiatric: Judgment intact, Mood & affect appropriate for pt's clinical situation  Dermatologic: See M/S exam for extremity exam, no rashes otherwise noted  Lymph : No Cervical, Axillary, or Inguinal lymphadenopathy    Non-Invasive Vascular Imaging  BLE Venous Insufficiency Duplex (Date: 06/28/2015):   RLE:   no DVT and SVT,   no GSV reflux,   no SSV reflux,  + deep venous reflux: CFV  LLE:  no DVT and SVT,   no GSV reflux,   no SSV reflux,  + deep venous reflux: CFV   Outside Studies/Documentation 3 pages of outside documents were reviewed including: outpatient PCP chart.   Medical Decision Making  Emily Wood is a 64 y.o. female who presents with: BLE chronic venous insufficiency (C4),     Surprisingly, her BLE venous reflux exam was so non-diagnostic as her skin around her ankle is consistent with chronic venous stasis.  Based on the patient's history and examination, I recommend: OTC compression stockings.  There is not surgical intervention possible in this patient, so further evaluation of the etiology of her swelling is necessary.  Thank you for allowing [place] to participate in this patient's care.   Adele Barthel, MD [facility] Office: [phone] Pager: [phone]  06/28/2015, 12:41 PM

## 2015-08-02 ENCOUNTER — Ambulatory Visit (INDEPENDENT_AMBULATORY_CARE_PROVIDER_SITE_OTHER): Payer: BLUE CROSS/BLUE SHIELD | Admitting: Family Medicine

## 2015-08-02 VITALS — BP 164/74 | HR 87 | Temp 97.8°F | Resp 16 | Ht 66.0 in | Wt 213.0 lb

## 2015-08-02 DIAGNOSIS — R748 Abnormal levels of other serum enzymes: Secondary | ICD-10-CM | POA: Diagnosis not present

## 2015-08-02 DIAGNOSIS — Z13220 Encounter for screening for lipoid disorders: Secondary | ICD-10-CM

## 2015-08-02 DIAGNOSIS — E119 Type 2 diabetes mellitus without complications: Secondary | ICD-10-CM

## 2015-08-02 DIAGNOSIS — M79652 Pain in left thigh: Secondary | ICD-10-CM | POA: Diagnosis not present

## 2015-08-02 DIAGNOSIS — R7989 Other specified abnormal findings of blood chemistry: Secondary | ICD-10-CM

## 2015-08-02 LAB — COMPREHENSIVE METABOLIC PANEL
ALK PHOS: 72 U/L (ref 33–130)
ALT: 28 U/L (ref 6–29)
AST: 31 U/L (ref 10–35)
Albumin: 4.1 g/dL (ref 3.6–5.1)
BILIRUBIN TOTAL: 0.7 mg/dL (ref 0.2–1.2)
BUN: 40 mg/dL — AB (ref 7–25)
CO2: 31 mmol/L (ref 20–31)
Calcium: 10.3 mg/dL (ref 8.6–10.4)
Chloride: 97 mmol/L — ABNORMAL LOW (ref 98–110)
Creat: 1.35 mg/dL — ABNORMAL HIGH (ref 0.50–0.99)
GLUCOSE: 80 mg/dL (ref 65–99)
Potassium: 3.5 mmol/L (ref 3.5–5.3)
Sodium: 140 mmol/L (ref 135–146)
TOTAL PROTEIN: 7.6 g/dL (ref 6.1–8.1)

## 2015-08-02 LAB — LIPID PANEL
Cholesterol: 198 mg/dL (ref 125–200)
HDL: 58 mg/dL (ref >=46)
LDL Cholesterol: 120 mg/dL (ref <130)
Total CHOL/HDL Ratio: 3.4 Ratio (ref <=5.0)
Triglycerides: 100 mg/dL (ref <150)
VLDL: 20 mg/dL (ref <30)

## 2015-08-02 LAB — POCT GLYCOSYLATED HEMOGLOBIN (HGB A1C): HEMOGLOBIN A1C: 6.3

## 2015-08-02 MED ORDER — PREGABALIN 75 MG PO CAPS: 75.0000 mg | ORAL_CAPSULE | Freq: Two times a day (BID) | ORAL | Status: DC

## 2015-08-02 NOTE — Progress Notes (Addendum)
[facility] [address], [place] [address] [phone]  Date:  08/02/2015   Name:  Emily Wood   DOB:  05-May-1951   MRN:  [id number]  PCP:  [name], MD    Chief Complaint: Leg Pain and Lab Work   History of Present Illness:  Emily Wood is a 65 y.o. very pleasant female patient who presents with the following:  Here today for labs and to discuss a problem with her left leg She was dx with DM in September and was started on metformin.  She is tolerating the metformin well.  She has lost weight!  She changed her diet- "nothing white, atkins type diet." she would like to loosen up her carb intake if she can - would like to see how her A1c looks today  She is fasting today for labs  She also has noted some pain in her left leg- it started with an itching on a small area on her left lateral thigh. This has gotten worse, and feels kind of like the area was scraped, or like a crawling sensation at times, other times a sharp pain. No known injury or other cause for her sx.    She never noted any rash or mark on the skin. No pain in the hip joint itself  Wt Readings from Last 3 Encounters:  08/02/15 213 lb (96.616 kg)  06/28/15 218 lb (98.884 kg)  05/08/15 246 lb 6.4 oz (111.766 kg)     Lab Results  Component Value Date   HGBA1C 9.5* 04/19/2015     Patient Active Problem List   Diagnosis Date Noted  . Chronic venous insufficiency 06/28/2015  . Essential hypertension 02/15/2015  . Peripheral edema 02/15/2015  . Chronic knee pain 02/15/2015    Past Medical History  Diagnosis Date  . Hypertension   . Diabetes mellitus without complication ([place])     History reviewed. No pertinent past surgical history.  Social History  Substance Use Topics  . Smoking status: Never Smoker   . Smokeless tobacco: Never Used  . Alcohol Use: No    History reviewed. No pertinent family history.  No Known Allergies  Medication list has been  reviewed and updated.  Current Outpatient Prescriptions on File Prior to Visit  Medication Sig Dispense Refill  . aspirin 81 MG tablet Take 81 mg by mouth daily.    . cyclobenzaprine (FLEXERIL) 10 MG tablet Take 1 tablet (10 mg total) by mouth 2 (two) times daily as needed for muscle spasms. 30 tablet 0  . furosemide (LASIX) 40 MG tablet Take 1 tablet (40 mg total) by mouth daily. 90 tablet 1  . losartan (COZAAR) 50 MG tablet Take 1 tablet (50 mg total) by mouth daily. 90 tablet 3  . metFORMIN (GLUCOPHAGE) 500 MG tablet Take 1 tablet (500 mg total) by mouth 2 (two) times daily with a meal. 180 tablet 3  . metoprolol (LOPRESSOR) 50 MG tablet Take 1 tablet (50 mg total) by mouth 2 (two) times daily. 180 tablet 3  . potassium chloride SA (K-DUR,KLOR-CON) 20 MEQ tablet Take 1 tablet (20 mEq total) by mouth daily. 90 tablet 1  . traMADol (ULTRAM) 50 MG tablet Take 1 tablet (50 mg total) by mouth every 8 (eight) hours as needed. May take 2 if needed 40 tablet 0   Current Facility-Administered Medications on File Prior to Visit  Medication Dose Route Frequency Provider Last Rate Last Dose  . methylPREDNISolone acetate (DEPO-MEDROL) injection 40 mg  40 mg Intra-articular Once Darreld Mclean, MD        Review of Systems:  As per HPI- otherwise negative.   Physical Examination: Filed Vitals:   08/02/15 1000  BP: 164/74  Pulse: 87  Temp: 97.8 F (36.6 C)  Resp: 16   Filed Vitals:   08/02/15 1000  Height: 5\' 6"  (1.676 m)  Weight: 213 lb (96.616 kg)   Body mass index is 34.4 kg/(m^2). Ideal Body Weight: Weight in (lb) to have BMI = 25: 154.6  GEN: WDWN, NAD, Non-toxic, A & O x 3, looks well, has lost weight HEENT: Atraumatic, Normocephalic. Neck supple. No masses, No LAD. Ears and Nose: No external deformity. CV: RRR, No M/G/R. No JVD. No thrill. No extra heart sounds. PULM: CTA B, no wheezes, crackles, rhonchi. No retractions. No resp. distress. No accessory muscle use. ABD: S,  NT, ND, +BS. No rebound. No HSM. EXTR: No c/c/e NEURO Normal gait.  PSYCH: Normally interactive. Conversant. Not depressed or anxious appearing.  Calm demeanor.  She indicates the left lateral thigh as the area of concern. There is no unusual skin finding to my exam.  However she notes that touch feels unusual and that non- noxious stimuli can feel painful  Results for orders placed or performed in visit on 08/02/15  POCT glycosylated hemoglobin (Hb A1C)  Result Value Ref Range   Hemoglobin A1C 6.3    Assessment and Plan: Controlled type 2 diabetes mellitus without complication, without long-term current use of insulin ([place]) - Plan: POCT glycosylated hemoglobin (Hb A1C), Comprehensive metabolic panel  Screening for hyperlipidemia - Plan: Lipid panel  Left thigh pain - Plan: pregabalin (LYRICA) 75 MG capsule  Congratulated her on her excellent progress with her DM.  Will continue current dose of metformin.  She may add some carbs to her diet and she is currently eating none whatsoever  Await her lipids The left leg pain is suggestive of a nerve issue.  No other nerve pains or other sx Offered neurology referral vs trial of medication and she would like to try lyrica first.  She will keep me updated as to her progress  Rechecked her BP, but it is about the same.  She has not yet taken her HTN medications   Signed [name], MD  Called to discuss her labs. LMOM- her renal function has gone up which is unusual for her.  Before we get too concerned would like to recheck in a week or so.  Please come by for a lab visit only and we can check her bmp. Also might try cutting her lasix dose in half in the meantime   Results for orders placed or performed in visit on 08/02/15  Comprehensive metabolic panel  Result Value Ref Range   Sodium 140 135 - 146 mmol/L   Potassium 3.5 3.5 - 5.3 mmol/L   Chloride 97 (L) 98 - 110 mmol/L   CO2 31 20 - 31 mmol/L   Glucose, Bld 80 65 - 99 mg/dL    BUN 40 (H) 7 - 25 mg/dL   Creat 1.35 (H) 0.50 - 0.99 mg/dL   Total Bilirubin 0.7 0.2 - 1.2 mg/dL   Alkaline Phosphatase 72 33 - 130 U/L   AST 31 10 - 35 U/L   ALT 28 6 - 29 U/L   Total Protein 7.6 6.1 - 8.1 g/dL   Albumin 4.1 3.6 - 5.1 g/dL   Calcium 10.3 8.6 - 10.4 mg/dL  Lipid panel  Result  Value Ref Range   Cholesterol 198 125 - 200 mg/dL   Triglycerides 100 <150 mg/dL   HDL 58 >=46 mg/dL   Total CHOL/HDL Ratio 3.4 <=5.0 Ratio   VLDL 20 <30 mg/dL   LDL Cholesterol 120 <130 mg/dL  POCT glycosylated hemoglobin (Hb A1C)  Result Value Ref Range   Hemoglobin A1C 6.3    Her creat has gone up for some reason- her GFR is currently 45.7.  She can continue her metformin but we will need to recheck this soon.  Will place an order for a BMP

## 2015-08-02 NOTE — Patient Instructions (Signed)
I will be in touch with the rest of your labs Your A1c looks great!  Continue taking your metformin.  It is ok to loosen up your carb intake some towards a diet you can sustain for life.  Let's plan to recheck your diabetes in 6 months or so For your thigh pain, try the lyrica 75 twice a day and please give me an update in 2-3 weeks.  Depending on your response we may try increasing the dose.    You can stop the muscle relaxer.

## 2015-08-04 ENCOUNTER — Encounter: Payer: Self-pay | Admitting: Family Medicine

## 2015-08-04 NOTE — Addendum Note (Signed)
Addended by: [name] C on: 08/04/2015 03:39 PM   Modules accepted: Orders

## 2015-08-05 ENCOUNTER — Telehealth: Payer: Self-pay

## 2015-08-05 ENCOUNTER — Other Ambulatory Visit: Payer: Self-pay

## 2015-08-05 DIAGNOSIS — M792 Neuralgia and neuritis, unspecified: Secondary | ICD-10-CM

## 2015-08-05 DIAGNOSIS — M79652 Pain in left thigh: Secondary | ICD-10-CM

## 2015-08-05 MED ORDER — PREGABALIN 75 MG PO CAPS: 75.0000 mg | ORAL_CAPSULE | Freq: Two times a day (BID) | ORAL | Status: DC

## 2015-08-05 NOTE — Telephone Encounter (Signed)
Pt advised.

## 2015-08-05 NOTE — Telephone Encounter (Signed)
Pt states that the rx she was give over the weekend was not called into [facility]  Best number [phone]

## 2015-08-05 NOTE — Telephone Encounter (Signed)
Pt called to let Dr. Lorelei Pont know that she was taking losartan (COZAAR) 50 MG tablet and furosemide (LASIX) 40 MG tablet when her last labs were done; She thinks that may be why her results were abnormal.

## 2015-08-05 NOTE — Telephone Encounter (Signed)
Rx was printed. Re-sent to pharm.

## 2015-08-06 MED ORDER — GABAPENTIN 300 MG PO CAPS: ORAL_CAPSULE | ORAL | Status: DC

## 2015-08-06 MED ORDER — GABAPENTIN 300 MG PO CAPS: 300.0000 mg | ORAL_CAPSULE | Freq: Three times a day (TID) | ORAL | Status: DC

## 2015-08-06 NOTE — Telephone Encounter (Signed)
Gave patient the message from dr copland and is still wanting to know if she should still take the losartan and the lasix

## 2015-08-06 NOTE — Telephone Encounter (Signed)
Dr Lorelei Pont, please see Emily Wood's message from pt immediately below. Then, also, pharm faxed req to clarify directions on gabapentin Rx. There are two different sigs, and the 2nd titration doesn't make sense. Is it supposed to be 1 pill on day 1, then 2 pills on day 2, then 3 pills on day 3 and thereafter? Please advise.

## 2015-08-06 NOTE — Addendum Note (Signed)
Addended by: Elwyn Reach A on: 08/06/2015 03:22 PM   Modules accepted: Orders

## 2015-08-06 NOTE — Telephone Encounter (Signed)
Called her back- she takes the lasix 40 every day but was fasting and perhaps a bit dehydrated at her last visit.  She will come in for repeat labs next week.  Will NOT fast prior to labs  Also the lyrica was too expensive- will change to gabapentin

## 2015-08-06 NOTE — Addendum Note (Signed)
Addended by: [name] C on: 08/06/2015 03:35 PM   Modules accepted: Orders

## 2015-08-06 NOTE — Telephone Encounter (Signed)
Called [facility] to clarify rx- called in rx on the MD line BP Readings from Last 3 Encounters:  08/02/15 164/74  06/28/15 172/70  05/08/15 152/88   Also called pt- continue taking meds as usual until we recheck labs She would like to see neurology after all about her leg pain- will set this up for her

## 2015-08-09 ENCOUNTER — Encounter: Payer: Self-pay | Admitting: Family Medicine

## 2015-08-12 ENCOUNTER — Other Ambulatory Visit: Payer: Self-pay | Admitting: Family Medicine

## 2015-08-14 ENCOUNTER — Encounter: Payer: Self-pay | Admitting: Family Medicine

## 2015-08-14 ENCOUNTER — Ambulatory Visit: Payer: 59 | Admitting: Family Medicine

## 2015-08-15 ENCOUNTER — Other Ambulatory Visit (INDEPENDENT_AMBULATORY_CARE_PROVIDER_SITE_OTHER): Payer: BLUE CROSS/BLUE SHIELD | Admitting: *Deleted

## 2015-08-15 DIAGNOSIS — R748 Abnormal levels of other serum enzymes: Secondary | ICD-10-CM

## 2015-08-15 DIAGNOSIS — R7989 Other specified abnormal findings of blood chemistry: Secondary | ICD-10-CM

## 2015-08-15 LAB — BASIC METABOLIC PANEL
BUN: 23 mg/dL (ref 7–25)
CHLORIDE: 100 mmol/L (ref 98–110)
CO2: 24 mmol/L (ref 20–31)
Calcium: 9.1 mg/dL (ref 8.6–10.4)
Creat: 1.18 mg/dL — ABNORMAL HIGH (ref 0.50–0.99)
Glucose, Bld: 80 mg/dL (ref 65–99)
POTASSIUM: 3.5 mmol/L (ref 3.5–5.3)
Sodium: 137 mmol/L (ref 135–146)

## 2015-08-20 ENCOUNTER — Encounter: Payer: Self-pay | Admitting: Family Medicine

## 2015-08-20 ENCOUNTER — Telehealth: Payer: Self-pay | Admitting: Family Medicine

## 2015-08-20 DIAGNOSIS — R7989 Other specified abnormal findings of blood chemistry: Secondary | ICD-10-CM

## 2015-08-20 NOTE — Telephone Encounter (Signed)
Called and let her know that her renal function is improved.  She will plan to have a BMP rechecked in 2 months or so

## 2015-08-26 ENCOUNTER — Ambulatory Visit (INDEPENDENT_AMBULATORY_CARE_PROVIDER_SITE_OTHER): Payer: BLUE CROSS/BLUE SHIELD | Admitting: Neurology

## 2015-08-26 ENCOUNTER — Encounter: Payer: Self-pay | Admitting: Neurology

## 2015-08-26 ENCOUNTER — Ambulatory Visit (INDEPENDENT_AMBULATORY_CARE_PROVIDER_SITE_OTHER): Payer: BLUE CROSS/BLUE SHIELD

## 2015-08-26 ENCOUNTER — Telehealth: Payer: Self-pay

## 2015-08-26 ENCOUNTER — Ambulatory Visit (INDEPENDENT_AMBULATORY_CARE_PROVIDER_SITE_OTHER): Payer: BLUE CROSS/BLUE SHIELD | Admitting: Family Medicine

## 2015-08-26 VITALS — BP 202/85 | HR 84 | Ht 65.0 in | Wt 211.8 lb

## 2015-08-26 VITALS — BP 165/90 | HR 82 | Temp 97.8°F | Resp 16 | Ht 65.5 in | Wt 211.0 lb

## 2015-08-26 DIAGNOSIS — R109 Unspecified abdominal pain: Secondary | ICD-10-CM | POA: Diagnosis not present

## 2015-08-26 DIAGNOSIS — R079 Chest pain, unspecified: Secondary | ICD-10-CM

## 2015-08-26 DIAGNOSIS — G5712 Meralgia paresthetica, left lower limb: Secondary | ICD-10-CM

## 2015-08-26 DIAGNOSIS — I10 Essential (primary) hypertension: Secondary | ICD-10-CM

## 2015-08-26 DIAGNOSIS — R1013 Epigastric pain: Secondary | ICD-10-CM | POA: Diagnosis not present

## 2015-08-26 DIAGNOSIS — R21 Rash and other nonspecific skin eruption: Secondary | ICD-10-CM

## 2015-08-26 LAB — TROPONIN I: Troponin I: <0.01 ng/mL (ref <=0.05)

## 2015-08-26 LAB — BASIC METABOLIC PANEL
BUN: 19 mg/dL (ref 7–25)
CALCIUM: 10.5 mg/dL — AB (ref 8.6–10.4)
CO2: 30 mmol/L (ref 20–31)
Chloride: 98 mmol/L (ref 98–110)
Creat: 1.01 mg/dL — ABNORMAL HIGH (ref 0.50–0.99)
GLUCOSE: 100 mg/dL — AB (ref 65–99)
Potassium: 3.6 mmol/L (ref 3.5–5.3)
SODIUM: 138 mmol/L (ref 135–146)

## 2015-08-26 MED ORDER — LIDOCAINE 5 % EX PTCH
2.0000 | MEDICATED_PATCH | CUTANEOUS | Status: DC
Start: 2015-08-26 — End: 2015-08-28

## 2015-08-26 MED ORDER — SUCRALFATE 1 G PO TABS: 1.0000 g | ORAL_TABLET | Freq: Three times a day (TID) | ORAL | Status: DC

## 2015-08-26 MED ORDER — GI COCKTAIL ~~LOC~~
30.0000 mL | Freq: Once | ORAL | Status: AC
  Administered 2015-08-26: 30 mL via ORAL

## 2015-08-26 NOTE — Progress Notes (Signed)
[facility]    Provider:  Dr Jaynee Eagles Referring Provider: Lorelei Pont Gay Filler, MD Primary Care Physician:  [name], MD  CC:  Left leg numbness  HPI:  Emily Wood is a 65 y.o. female here as a referral from Dr. Lorelei Pont for leg pain. PMHx diabetes, obesity. Symptoms started in November. Started on the left lateral thigh, progressive. From the hip to the anterolateral knee. Burning, tingling, like something crawling on her thigh. Continuous, not positional. She lost weight rapidly prior to the symptoms starting. Also stabbing pain. She was diagnosed with Diabetes in September and she lost 40 pounds in the timeframe she started having these symptoms. The muscle relaxer and the pain killer helps, has not tried the gabapentin. No weakness. No back pain, no radicular symptoms. No other focal neurologic symptoms.  Reviewed notes, labs and imaging from outside physicians, which showed:  XR of the hip 03/2015 personally reviewed images and agree with the following: : No acute fracture, subluxation or dislocation.  The joint space is unremarkable.  No focal bony lesions are identified.  IMPRESSION: Negative.  BMP 1/31 unremarkable creat 1.01, hgba1c 6.3  Review of Systems: Patient complains of symptoms per HPI as well as the following symptoms: swellin gin legs, itching, numbness. Pertinent negatives per HPI. All others negative.   Social History   Social History  . Marital Status: Married    Spouse Name: [name]  . Number of Children: 0  . Years of Education: 16   Occupational History  . Unemployed    Social History Main Topics  . Smoking status: Never Smoker   . Smokeless tobacco: Never Used  . Alcohol Use: No  . Drug Use: No  . Sexual Activity: Not on file   Other Topics Concern  . Not on file   Social History Narrative   Lives with husband   Caffeine use: 16oz coffee per day    History reviewed. No pertinent family history.  Past  Medical History  Diagnosis Date  . Hypertension   . Diabetes mellitus without complication [facility])     Past Surgical History  Procedure Laterality Date  . Partial hysterectomy      Still has ovaries  . Cholecystectomy    . Uterine fibroid surgery    . Hernia repair    . Other surgical history      scar tissue removal from bowels    Current Outpatient Prescriptions  Medication Sig Dispense Refill  . aspirin 81 MG tablet Take 81 mg by mouth daily.    . cyclobenzaprine (FLEXERIL) 10 MG tablet TAKE ONE TABLET BY MOUTH TWICE DAILY AS NEEDED FOR MUSCLE SPASM 30 tablet 0  . furosemide (LASIX) 40 MG tablet Take 1 tablet (40 mg total) by mouth daily. 90 tablet 1  . losartan-hydrochlorothiazide (HYZAAR) 100-25 MG tablet Take 1 tablet by mouth daily.  3  . metFORMIN (GLUCOPHAGE) 500 MG tablet Take 1 tablet (500 mg total) by mouth 2 (two) times daily with a meal. 180 tablet 3  . metoprolol (LOPRESSOR) 50 MG tablet Take 1 tablet (50 mg total) by mouth 2 (two) times daily. 180 tablet 3  . potassium chloride SA (K-DUR,KLOR-CON) 20 MEQ tablet Take 1 tablet (20 mEq total) by mouth daily. 90 tablet 1  . traMADol (ULTRAM) 50 MG tablet TAKE ONE TO TWO TABLETS BY MOUTH EVERY 8 HOURS AS NEEDED 40 tablet 0  . gabapentin (NEURONTIN) 300 MG capsule Take 1 pill on day 1, then 2 pills on day 2, then  3 pills on day 3 and thereafter (Patient not taking: Reported on 08/26/2015) 90 capsule 5  . losartan (COZAAR) 50 MG tablet Take 1 tablet (50 mg total) by mouth daily. 90 tablet 3   No current facility-administered medications for this visit.    Allergies as of 08/26/2015  . (No Known Allergies)    Vitals: BP 202/85 mmHg  Pulse 84  Ht 5\' 5"  (1.651 m)  Wt 211 lb 12.8 oz (96.072 kg)  BMI 35.25 kg/m2 Last Weight:  Wt Readings from Last 1 Encounters:  08/26/15 211 lb 12.8 oz (96.072 kg)   Last Height:   Ht Readings from Last 1 Encounters:  08/26/15 5\' 5"  (1.651 m)   Physical exam: Exam: Gen: NAD,  conversant, well nourised, obese, well groomed                     CV: RRR, no MRG. No Carotid Bruits. No peripheral edema, warm, nontender Eyes: Conjunctivae clear without exudates or hemorrhage  Neuro: Detailed Neurologic Exam  Speech:    Speech is normal; fluent and spontaneous with normal comprehension.  Cognition:    The patient is oriented to person, place, and time;     recent and remote memory intact;     language fluent;     normal attention, concentration,     fund of knowledge Cranial Nerves:    The pupils are equal, round, and reactive to light. The fundi are normal and spontaneous venous pulsations are present. Visual fields are full to finger confrontation. Extraocular movements are intact. Trigeminal sensation is intact and the muscles of mastication are normal. The face is symmetric. The palate elevates in the midline. Hearing intact. Voice is normal. Shoulder shrug is normal. The tongue has normal motion without fasciculations.   Coordination:    Normal finger to nose and heel to shin. Normal rapid alternating movements.   Gait:    Heel-toe and tandem gait are normal.   Motor Observation:    No asymmetry, no atrophy, and no involuntary movements noted. Tone:    Normal muscle tone.    Posture:    Posture is normal. normal erect    Strength:    Strength is V/V in the upper and lower limbs.      Sensation: dec sensation in the anterolateral left thigh in the distribution of the lateral femoral cutaneous nerve     Reflex Exam:  DTR's:    Absent AJs otherwise deep tendon reflexes in the upper and lower extremities are normal bilaterally.   Toes:    The toes are downgoing bilaterally.   Clonus:    Clonus is absent.       Assessment/Plan: 65 year old patient with sensory changes in the distribution of the left lateral femoral cutaneous nerve, or Meralgia Paresthetica.   As far as your medications are concerned, I would like to  suggest Neurontin/gabapentin lidoderm patches up to 2 at a time, can wear for 12 hours a day Discussed nerve blocks, if patient would like can be referred to pain clinic in the future for procedure    Sarina Ill, MD  [facility] [address] [place], [place] [address]  Phone [phone] Fax [phone]

## 2015-08-26 NOTE — Telephone Encounter (Signed)
[facility] called with stat Troponin for patient. Result is <0.01.

## 2015-08-26 NOTE — Patient Instructions (Addendum)
Because you received an x-ray today, you will receive an invoice from [facility]. Please contact [facility] at [phone] with questions or concerns regarding your invoice. Our billing staff will not be able to assist you with those questions.  If your chest pain returns or if you have any other concerns please let us know or otherwise seek care right away!  For the time being we are going to treat you for likely GERD with carafate (take before meals and before bed for 10 days) And also an OTC acid reducer such as pepcid, prilosec or zantac- take this for 2 weeks

## 2015-08-26 NOTE — Patient Instructions (Addendum)
Overall you are doing fairly well but I do want to suggest a few things today:   Remember to drink plenty of fluid, eat healthy meals and do not skip any meals. Try to eat protein with a every meal and eat a healthy snack such as fruit or nuts in between meals. Try to keep a regular sleep-wake schedule and try to exercise daily, particularly in the form of walking, 20-30 minutes a day, if you can.   As far as your medications are concerned, I would like to suggest Neurontin/gabapentin lidoderm patches up to 2 at a time, can wear for 12 hours aday  Our phone number is [phone]. We also have an after hours call service for urgent matters and there is a physician on-call for urgent questions. For any emergencies you know to call 911 or go to the nearest emergency room

## 2015-08-26 NOTE — Progress Notes (Signed)
[facility] [address] [place] [address] [phone]  Date:  08/26/2015   Name:  Emily Wood   DOB:  1951-05-23   MRN:  [id number]  PCP:  [name], MD    Chief Complaint: Flank Pain and Chest Pain   History of Present Illness:  Emily Wood is a 65 y.o. very pleasant female patient who presents with the following:  History of HTN and DM- she was dx with DM 9/16 and has lost weight by changing her diet. She improved her A1c from 9.5 to 6.3% at last check.  Lab Results  Component Value Date   HGBA1C 6.3 08/02/2015   BP Readings from Last 3 Encounters:  08/26/15 187/82  08/26/15 202/85  08/02/15 164/74   Here today with substernal chest pain and pain under her left breast.  She has not had this in the past  Yesterday evening around 7pm she noted onset of what she thought must be "hunger pains" because she had not eaten all day since breakfast. Eating did not help- she admits that she ate "superbowl food"- pizza, brownies, diet root beer. She continues to have pain now under the left breast and in her mid-chest She does not feel SOB She does not have any cardiac history, never had any cardiac testing.  She has not noted a cough or fever.   She felt fine until yesterday evening around 7pm The pian does not seem to be exertional  No family history of CAD.    She is on lopressor and hyzaar, as well as lasix; however admits that she did not take her medications this am as she usually does  Patient Active Problem List   Diagnosis Date Noted  . Diabetes mellitus type 2, controlled ([place]) 08/02/2015  . Chronic venous insufficiency 06/28/2015  . Essential hypertension 02/15/2015  . Peripheral edema 02/15/2015  . Chronic knee pain 02/15/2015    Past Medical History  Diagnosis Date  . Hypertension   . Diabetes mellitus without complication [facility])     Past Surgical History  Procedure Laterality Date  . Partial hysterectomy      Still  has ovaries  . Cholecystectomy    . Uterine fibroid surgery    . Hernia repair    . Other surgical history      scar tissue removal from bowels    Social History  Substance Use Topics  . Smoking status: Never Smoker   . Smokeless tobacco: Never Used  . Alcohol Use: No    No family history on file.  No Known Allergies  Medication list has been reviewed and updated.  Current Outpatient Prescriptions on File Prior to Visit  Medication Sig Dispense Refill  . aspirin 81 MG tablet Take 81 mg by mouth daily.    . cyclobenzaprine (FLEXERIL) 10 MG tablet TAKE ONE TABLET BY MOUTH TWICE DAILY AS NEEDED FOR MUSCLE SPASM 30 tablet 0  . furosemide (LASIX) 40 MG tablet Take 1 tablet (40 mg total) by mouth daily. 90 tablet 1  . lidocaine (LIDODERM) 5 % Place 2 patches onto the skin daily. Remove & Discard patch within 12 hours or as directed by MD 60 patch 11  . losartan-hydrochlorothiazide (HYZAAR) 100-25 MG tablet Take 1 tablet by mouth daily.  3  . metFORMIN (GLUCOPHAGE) 500 MG tablet Take 1 tablet (500 mg total) by mouth 2 (two) times daily with a meal. 180 tablet 3  . metoprolol (LOPRESSOR) 50 MG tablet Take 1 tablet (50  mg total) by mouth 2 (two) times daily. 180 tablet 3  . potassium chloride SA (K-DUR,KLOR-CON) 20 MEQ tablet Take 1 tablet (20 mEq total) by mouth daily. 90 tablet 1  . traMADol (ULTRAM) 50 MG tablet TAKE ONE TO TWO TABLETS BY MOUTH EVERY 8 HOURS AS NEEDED 40 tablet 0  . gabapentin (NEURONTIN) 300 MG capsule Take 1 pill on day 1, then 2 pills on day 2, then 3 pills on day 3 and thereafter (Patient not taking: Reported on 08/26/2015) 90 capsule 5   No current facility-administered medications on file prior to visit.    Review of Systems:  As per HPI- otherwise negative.   Physical Examination: Filed Vitals:   08/26/15 1032  BP: 187/82  Pulse: 82  Temp: 97.8 F (36.6 C)  Resp: 16   Filed Vitals:   08/26/15 1032  Height: 5' 5.5" (1.664 m)  Weight: 211 lb (95.709  kg)   Body mass index is 34.57 kg/(m^2). Ideal Body Weight: Weight in (lb) to have BMI = 25: 152.2  GEN: WDWN, NAD, Non-toxic, A & O x 3, overweight, looks well HEENT: Atraumatic, Normocephalic. Neck supple. No masses, No LAD.  Bilateral TM wnl, oropharynx normal.  PEERL,EOMI.   Ears and Nose: No external deformity. CV: RRR, No M/G/R. No JVD. No thrill. No extra heart sounds. I am able to reproduce tenderness over her left ribs but not in the substernal area.  Belly is benign PULM: CTA B, no wheezes, crackles, rhonchi. No retractions. No resp. distress. No accessory muscle use. ABD: S, NT, ND, +BS. No rebound. No HSM. EXTR: No c/c/e NEURO Normal gait.  PSYCH: Normally interactive. Conversant. Not depressed or anxious appearing.  Calm demeanor.   Given a GI cocktail: this improved her pain by "99%" under her breast and completely in her substernal area  Dg Chest 2 View  08/26/2015  CLINICAL DATA:  Shortness of breath, chest pain. EXAM: CHEST  2 VIEW COMPARISON:  None in PACs FINDINGS: The lungs are well-expanded and clear. There is no pneumothorax, pneumomediastinum, or pleural effusion. The heart and pulmonary vascularity are normal. The mediastinum is normal in width. There is mild tortuosity of the descending thoracic aorta. The bony thorax is unremarkable. IMPRESSION: There is no active cardiopulmonary disease. Electronically Signed   By: David  Martinique M.D.   On: 08/26/2015 11:51   EKG: SR with non- specific T wave changes  She did not take herBP  meds yet this am- she normally takes them in the am.  She will take them as soon as she get home Assessment and Plan: Chest pain, unspecified chest pain type - Plan: EKG 12-Lead, DG Chest 2 View, gi cocktail (Maalox,Lidocaine,Donnatal), Basic metabolic panel, sucralfate (CARAFATE) 1 g tablet, Troponin I, CANCELED: Troponin I, CANCELED: Troponin I  Flank pain - Plan: EKG 12-Lead  Abdominal pain, epigastric - Plan: sucralfate (CARAFATE) 1 g  tablet  Rash and nonspecific skin eruption - Plan: Ambulatory referral to Dermatology  Essential hypertension  Here today with chest and left side pain.  A GI cocktail resolved her sx nearly completely.  Given her risk factors will also run a troponin for further assurance but her pain is most likely GI in nature Counseled her that her BP is much too high- she will take her meds right away She requests a derm referral for a persistent rash on her ankles   Signed [name], MD  Received her stat troponin which is negative.  Called to check on her  at 7:20 pm- she reports that her pain is still resolved.

## 2015-08-27 ENCOUNTER — Telehealth: Payer: Self-pay | Admitting: Neurology

## 2015-08-27 DIAGNOSIS — G5712 Meralgia paresthetica, left lower limb: Secondary | ICD-10-CM | POA: Insufficient documentation

## 2015-08-27 NOTE — Telephone Encounter (Signed)
Called pt back and verified we received request and we are going to work on request. I am going to speak to Dr Jaynee Eagles to see if she would like to still use this medication. If so, we will submit PA. She verbalized understanding. Advised I will keep her updated.

## 2015-08-27 NOTE — Telephone Encounter (Signed)
Patient is calling and states that her pharmacy cannot fill her Rx Lidoderm 5% as her insurance company needs more information as to why she needs the drug. Her insurance is [employer]: Pharmacy [phone]. Provider serv [phone]. Please call patient.

## 2015-08-28 ENCOUNTER — Other Ambulatory Visit: Payer: Self-pay | Admitting: Neurology

## 2015-08-28 MED ORDER — LIDOCAINE 5 % EX OINT: 1.0000 "application " | TOPICAL_OINTMENT | CUTANEOUS | Status: DC | PRN

## 2015-08-28 NOTE — Telephone Encounter (Signed)
LVM relaying message below. Told pt to call if she has any further questions. Gave [facility] phone number.

## 2015-08-28 NOTE — Telephone Encounter (Signed)
Emily Wood, sometimes insurance companies want patient to try the lotion befpre they pay for the patch. I called in the topical ointment, lets see if that works first then. thanks

## 2015-08-30 ENCOUNTER — Encounter: Payer: Self-pay | Admitting: Family Medicine

## 2015-09-02 ENCOUNTER — Telehealth: Payer: Self-pay

## 2015-09-02 DIAGNOSIS — E119 Type 2 diabetes mellitus without complications: Secondary | ICD-10-CM

## 2015-09-02 DIAGNOSIS — R609 Edema, unspecified: Secondary | ICD-10-CM

## 2015-09-02 NOTE — Telephone Encounter (Signed)
Pt states that due to insurance change the pharmacy has changed to [place] , [place] and they need new written rx of metformin and furosemide  And this needs to me a 3 month supply of both   Best number [phone]

## 2015-09-03 MED ORDER — FUROSEMIDE 40 MG PO TABS: 40.0000 mg | ORAL_TABLET | Freq: Every day | ORAL | Status: DC

## 2015-09-03 MED ORDER — METFORMIN HCL 500 MG PO TABS
500.0000 mg | ORAL_TABLET | Freq: Two times a day (BID) | ORAL | Status: DC
Start: 2015-09-03 — End: 2016-06-04

## 2015-09-03 NOTE — Telephone Encounter (Signed)
Sent in Rxs and notified pt (the # below is incorrect) on VM.

## 2015-09-05 ENCOUNTER — Other Ambulatory Visit: Payer: Self-pay

## 2015-09-05 DIAGNOSIS — R609 Edema, unspecified: Secondary | ICD-10-CM

## 2015-09-05 DIAGNOSIS — M792 Neuralgia and neuritis, unspecified: Secondary | ICD-10-CM

## 2015-09-05 MED ORDER — LOSARTAN POTASSIUM-HCTZ 100-25 MG PO TABS: 1.0000 | ORAL_TABLET | Freq: Every day | ORAL | Status: DC

## 2015-09-05 MED ORDER — GABAPENTIN 300 MG PO CAPS: ORAL_CAPSULE | ORAL | Status: DC

## 2015-09-05 MED ORDER — POTASSIUM CHLORIDE CRYS ER 20 MEQ PO TBCR: 20.0000 meq | EXTENDED_RELEASE_TABLET | Freq: Every day | ORAL | Status: DC

## 2015-09-13 ENCOUNTER — Ambulatory Visit (INDEPENDENT_AMBULATORY_CARE_PROVIDER_SITE_OTHER): Payer: BLUE CROSS/BLUE SHIELD | Admitting: Family Medicine

## 2015-09-13 ENCOUNTER — Ambulatory Visit (INDEPENDENT_AMBULATORY_CARE_PROVIDER_SITE_OTHER): Payer: BLUE CROSS/BLUE SHIELD

## 2015-09-13 VITALS — BP 158/82 | HR 74 | Temp 99.5°F | Resp 16

## 2015-09-13 DIAGNOSIS — R0602 Shortness of breath: Secondary | ICD-10-CM

## 2015-09-13 DIAGNOSIS — R059 Cough, unspecified: Secondary | ICD-10-CM

## 2015-09-13 DIAGNOSIS — R05 Cough: Secondary | ICD-10-CM

## 2015-09-13 DIAGNOSIS — R509 Fever, unspecified: Secondary | ICD-10-CM

## 2015-09-13 MED ORDER — HYDROCODONE-HOMATROPINE 5-1.5 MG/5ML PO SYRP: 5.0000 mL | ORAL_SOLUTION | Freq: Three times a day (TID) | ORAL | Status: DC | PRN

## 2015-09-13 MED ORDER — LEVOFLOXACIN 500 MG PO TABS: 500.0000 mg | ORAL_TABLET | Freq: Every day | ORAL | Status: DC

## 2015-09-13 NOTE — Progress Notes (Signed)
@[facility] @  By signing my name below, I, Emily Wood, attest that this documentation has been prepared under the direction and in the presence of [name], MD.  Electronically Signed: [name], ED Scribe. 09/13/2015. 9:49 AM.  Patient ID: Emily Wood MRN: [id number], DOB: September 05, 1950, 65 y.o. Date of Encounter: 09/13/2015, 9:48 AM  Primary Physician: [name], MD  Chief Complaint:  Chief Complaint  Patient presents with  . Cough    since last week  . Headache    HPI: 65 y.o. year old female with history below presents with cough. Pt states symptoms started 4 days ago with scratchy throat. Her symptoms worsened 2 days ago with chills, bubbling stomach, flatulence, and wheezing. She denies having a fever at home but temperature in triage was 99.5. She also denies nausea.  No hx of asthma. Pt is unemployed.    Pt states she is very hard stick and prefers not to have blood drawn if possible.   Past Medical History  Diagnosis Date  . Hypertension   . Diabetes mellitus without complication ([place])      Home Meds: Prior to Admission medications   Medication Sig Start Date End Date Taking? Authorizing Provider  aspirin 81 MG tablet Take 81 mg by mouth daily.   Yes Historical Provider, MD  cyclobenzaprine (FLEXERIL) 10 MG tablet TAKE ONE TABLET BY MOUTH TWICE DAILY AS NEEDED FOR MUSCLE SPASM 08/13/15  Yes Gay Filler Copland, MD  furosemide (LASIX) 40 MG tablet Take 1 tablet (40 mg total) by mouth daily. 09/03/15  Yes Gay Filler Copland, MD  gabapentin (NEURONTIN) 300 MG capsule Take 3 capsules daily. 09/05/15  Yes Mancel Bale, PA-C  lidocaine (XYLOCAINE) 5 % ointment Apply 1 application topically as needed. 08/28/15  Yes Melvenia Beam, MD  losartan-hydrochlorothiazide (HYZAAR) 100-25 MG tablet Take 1 tablet by mouth daily. 09/05/15  Yes Mancel Bale, PA-C  metFORMIN (GLUCOPHAGE) 500 MG tablet Take 1 tablet (500 mg total) by mouth 2 (two) times daily with a meal. 09/03/15  Yes  Gay Filler Copland, MD  metoprolol (LOPRESSOR) 50 MG tablet Take 1 tablet (50 mg total) by mouth 2 (two) times daily. 05/20/15  Yes Gay Filler Copland, MD  potassium chloride SA (K-DUR,KLOR-CON) 20 MEQ tablet Take 1 tablet (20 mEq total) by mouth daily. 09/05/15  Yes Mancel Bale, PA-C  sucralfate (CARAFATE) 1 g tablet Take 1 tablet (1 g total) by mouth 4 (four) times daily -  with meals and at bedtime. Patient not taking: Reported on 09/13/2015 08/26/15   Darreld Mclean, MD  traMADol (ULTRAM) 50 MG tablet TAKE ONE TO TWO TABLETS BY MOUTH EVERY 8 HOURS AS NEEDED Patient not taking: Reported on 09/13/2015 08/13/15   Darreld Mclean, MD    Allergies: No Known Allergies  Social History   Social History  . Marital Status: Married    Spouse Name: [name]  . Number of Children: 0  . Years of Education: 16   Occupational History  . Unemployed    Social History Main Topics  . Smoking status: Never Smoker   . Smokeless tobacco: Never Used  . Alcohol Use: No  . Drug Use: No  . Sexual Activity: Not on file   Other Topics Concern  . Not on file   Social History Narrative   Lives with husband   Caffeine use: 16oz coffee per day     Review of Systems: Constitutional: negative for  night sweats, weight changes, or fatigue  HEENT: negative  for vision changes, hearing loss, congestion, rhinorrhea, ST, epistaxis, or sinus pressure Cardiovascular: negative for chest pain or palpitations Respiratory: negative for hemoptysis, or shortness of breath. Abdominal: negative for abdominal pain, nausea, vomiting, diarrhea, or constipation Dermatological: negative for rash Neurologic: negative for headache, dizziness, or syncope All other systems reviewed and are otherwise negative with the exception to those above and in the HPI.   Physical Exam: Blood pressure 158/82, pulse 74, temperature 99.5 F (37.5 C), resp. rate 16, SpO2 98 %., There is no weight on file to calculate BMI. General: Well  developed, well nourished, in no acute distress. Head: Normocephalic, atraumatic, eyes without discharge, sclera non-icteric, nares are without discharge. Bilateral auditory canals clear, TM's are without perforation, pearly grey and translucent with reflective cone of light bilaterally. Oral cavity moist, posterior pharynx without exudate, erythema, peritonsillar abscess, or post nasal drip.  Neck: Supple. No thyromegaly. Full ROM. No lymphadenopathy. No neck vein extension.  Lungs: Clear bilaterally to auscultation. Rales in right base.. Breathing is unlabored. Heart: RRR with S1 S2. No murmurs, rubs, or gallops appreciated. Msk:  Strength and tone normal for age. Extremities/Skin: Warm and dry. No clubbing or cyanosis. No edema. No rashes or suspicious lesions. Neuro: Alert and oriented X 3. Moves all extremities spontaneously. Gait is normal. CNII-XII grossly in tact. Psych:  Responds to questions appropriately with a normal affect.   [facility] reading (PRIMARY) by Dr. Joseph Art. CXR: right hilar density consistent with pneumonia  ASSESSMENT AND PLAN:  65 y.o. year old female with  This chart was scribed in my presence and reviewed by me personally.    ICD-9-CM ICD-10-CM   1. Cough 786.2 R05 DG Chest 2 View     levofloxacin (LEVAQUIN) 500 MG tablet     HYDROcodone-homatropine (HYCODAN) 5-1.5 MG/5ML syrup  2. Fever, unspecified fever cause 780.60 R50.9 DG Chest 2 View     levofloxacin (LEVAQUIN) 500 MG tablet     HYDROcodone-homatropine (HYCODAN) 5-1.5 MG/5ML syrup  3. Shortness of breath 786.05 R06.02 DG Chest 2 View     levofloxacin (LEVAQUIN) 500 MG tablet     HYDROcodone-homatropine (HYCODAN) 5-1.5 MG/5ML syrup     Signed, [name], MD    Signed, [name], MD 09/13/2015 9:48 AM

## 2015-09-13 NOTE — Patient Instructions (Addendum)
Because you received an x-ray today, you will receive an invoice from [facility]. Please contact [facility] at [phone] with questions or concerns regarding your invoice. Our billing staff will not be able to assist you with those questions.  Dr. Lorelei Pont is now at [facility] Her new office is on [employer], [address] [employer] [address] of [place] Phone #: [phone]

## 2015-09-19 ENCOUNTER — Encounter: Payer: Self-pay | Admitting: Family Medicine

## 2015-09-19 ENCOUNTER — Ambulatory Visit (INDEPENDENT_AMBULATORY_CARE_PROVIDER_SITE_OTHER): Payer: BLUE CROSS/BLUE SHIELD | Admitting: Family Medicine

## 2015-09-19 VITALS — BP 140/80 | HR 76 | Temp 98.2°F | Ht 65.5 in | Wt 212.0 lb

## 2015-09-19 DIAGNOSIS — J9801 Acute bronchospasm: Secondary | ICD-10-CM

## 2015-09-19 DIAGNOSIS — R05 Cough: Secondary | ICD-10-CM | POA: Diagnosis not present

## 2015-09-19 DIAGNOSIS — J189 Pneumonia, unspecified organism: Secondary | ICD-10-CM

## 2015-09-19 DIAGNOSIS — R059 Cough, unspecified: Secondary | ICD-10-CM

## 2015-09-19 MED ORDER — HYDROCOD POLST-CPM POLST ER 10-8 MG/5ML PO SUER: 5.0000 mL | Freq: Two times a day (BID) | ORAL | Status: DC | PRN

## 2015-09-19 MED ORDER — ALBUTEROL SULFATE (2.5 MG/3ML) 0.083% IN NEBU: 2.5000 mg | INHALATION_SOLUTION | Freq: Once | RESPIRATORY_TRACT | Status: DC

## 2015-09-19 MED ORDER — ALBUTEROL SULFATE HFA 108 (90 BASE) MCG/ACT IN AERS: 2.0000 | INHALATION_SPRAY | Freq: Four times a day (QID) | RESPIRATORY_TRACT | Status: DC | PRN

## 2015-09-19 MED ORDER — PREDNISONE 20 MG PO TABS: ORAL_TABLET | ORAL | Status: DC

## 2015-09-19 MED ORDER — ALBUTEROL SULFATE (2.5 MG/3ML) 0.083% IN NEBU
2.5000 mg | INHALATION_SOLUTION | Freq: Once | RESPIRATORY_TRACT | Status: AC
  Administered 2015-09-19: 2.5 mg via RESPIRATORY_TRACT

## 2015-09-19 NOTE — Patient Instructions (Signed)
It was good to see you today- I'm sorry that you are still sick! At this time I would not use more antibiotics Use the albuterol inhaler every 6 hours or so as needed for wheezing and congestion Use the tussionex cough syrup as needed (use this OR the other cough syrup, not both!) Remember this cough syrup will make you quite sleepy- do not take it when you need to drive.  Also do not combine with any other sedating medicatoins such as tramadol or flexeril (muscle relaxer)  Also, the gabapentin may increase sedation with the tussionex.  You should try a 1/2 dose of the syrup first.  Take a whole dose if needed   If you are not improving over the next couple of days fill and take the prednisone This will raise your glucose- watch your diet especially if you take this medication Please let me know if you are not significantly better in the next few days- Sooner if worse.

## 2015-09-19 NOTE — Progress Notes (Signed)
Pre visit review using our clinic review tool, if applicable. No additional management support is needed unless otherwise documented below in the visit note. 

## 2015-09-19 NOTE — Progress Notes (Signed)
[place] at [facility] [address], [place], [place] [address] [id number] [phone]  Date:  09/19/2015   Name:  Emily Wood   DOB:  11-27-1950   MRN:  [id number]  PCP:  [name], MD    Chief Complaint: Follow-up   History of Present Illness:  Emily Wood is a 65 y.o. very pleasant female patient who presents with the following:  Here today to recheck from pneumonia- she was seen at [facility] on 2/24 with low grade fever, cough. She was dx with pneumonia and was treated with levaquin 500 for 7 days and hycodan prn.  She feels like her chest is still congested, she is wheezing at night some of the time.  Overall she is a bit better but not yet well.    She had gotten sick a few days prior to her visit on the 24th.   She does not have an inhaler, she has no history of asthma and does not generally wheeze with illness.    No further fevers as far as she konws.   She did finish out the abx this morning and is about done with her cough syrup- she needs more cough syrup and wonders if there is anything stronger that she could use as her cough is not completely controlled  She is on gabapentin for her meralgia paraesthetica but notes that it does not seem to be helping to much so far.  She is taking all 3 tablets in the am instead of taking it TID however  CXR 2/24 as below: CHEST 2 VIEW COMPARISON: 08/26/2015.  FINDINGS: Mediastinum hilar structures normal. Borderline cardiomegaly. No pulmonary venous congestion. Mild right base subsegmental atelectasis and or infiltrate. IMPRESSION: 1. Mild right base subsegmental atelectasis and or infiltrate. 2. Cardiomegaly. No pulmonary venous congestion .  Lab Results  Component Value Date   HGBA1C 6.3 08/02/2015    Patient Active Problem List   Diagnosis Date Noted  . Meralgia paresthetica of left side 08/27/2015  . Diabetes mellitus type 2, controlled ([place]) 08/02/2015  .  Chronic venous insufficiency 06/28/2015  . Essential hypertension 02/15/2015  . Peripheral edema 02/15/2015  . Chronic knee pain 02/15/2015    Past Medical History  Diagnosis Date  . Hypertension   . Diabetes mellitus without complication [facility])     Past Surgical History  Procedure Laterality Date  . Partial hysterectomy      Still has ovaries  . Cholecystectomy    . Uterine fibroid surgery    . Hernia repair    . Other surgical history      scar tissue removal from bowels    Social History  Substance Use Topics  . Smoking status: Never Smoker   . Smokeless tobacco: Never Used  . Alcohol Use: No    No family history on file.  No Known Allergies  Medication list has been reviewed and updated.  Current Outpatient Prescriptions on File Prior to Visit  Medication Sig Dispense Refill  . aspirin 81 MG tablet Take 81 mg by mouth daily.    . cyclobenzaprine (FLEXERIL) 10 MG tablet TAKE ONE TABLET BY MOUTH TWICE DAILY AS NEEDED FOR MUSCLE SPASM 30 tablet 0  . furosemide (LASIX) 40 MG tablet Take 1 tablet (40 mg total) by mouth daily. 90 tablet 1  . gabapentin (NEURONTIN) 300 MG capsule Take 3 capsules daily. 270 capsule 1  . HYDROcodone-homatropine (HYCODAN) 5-1.5 MG/5ML syrup Take 5 mLs by mouth every 8 (  eight) hours as needed for cough. 120 mL 0  . levofloxacin (LEVAQUIN) 500 MG tablet Take 1 tablet (500 mg total) by mouth daily. 7 tablet 0  . lidocaine (XYLOCAINE) 5 % ointment Apply 1 application topically as needed. 35.44 g 12  . losartan-hydrochlorothiazide (HYZAAR) 100-25 MG tablet Take 1 tablet by mouth daily. 90 tablet 1  . metFORMIN (GLUCOPHAGE) 500 MG tablet Take 1 tablet (500 mg total) by mouth 2 (two) times daily with a meal. 180 tablet 2  . metoprolol (LOPRESSOR) 50 MG tablet Take 1 tablet (50 mg total) by mouth 2 (two) times daily. 180 tablet 3  . potassium chloride SA (K-DUR,KLOR-CON) 20 MEQ tablet Take 1 tablet (20 mEq total) by mouth daily. 90 tablet 3  .  sucralfate (CARAFATE) 1 g tablet Take 1 tablet (1 g total) by mouth 4 (four) times daily -  with meals and at bedtime. 40 tablet 0  . traMADol (ULTRAM) 50 MG tablet TAKE ONE TO TWO TABLETS BY MOUTH EVERY 8 HOURS AS NEEDED 40 tablet 0   No current facility-administered medications on file prior to visit.    Review of Systems:  As per HPI- otherwise negative.   Physical Examination: Filed Vitals:   09/19/15 0950  Pulse: 76  Temp: 98.2 F (36.8 C)   Filed Vitals:   09/19/15 0950  Height: 5' 5.5" (1.664 m)  Weight: 212 lb (96.163 kg)   Body mass index is 34.73 kg/(m^2). Ideal Body Weight: Weight in (lb) to have BMI = 25: 152.2  GEN: WDWN, NAD, Non-toxic, A & O x 3, overweight, looks well HEENT: Atraumatic, Normocephalic. Neck supple. No masses, No LAD. Ears and Nose: No external deformity. CV: RRR, No M/G/R. No JVD. No thrill. No extra heart sounds. PULM: moderate wheezing bilaterally,No retractions. No resp. distress. No accessory muscle use. ABD: S, NT, ND, +BS. No rebound. No HSM. EXTR: No c/c/e NEURO Normal gait.  PSYCH: Normally interactive. Conversant. Not depressed or anxious appearing.  Calm demeanor.  Coughing in room  Albuterol neb:  She felt better and had reduced wheezing  Assessment and Plan: CAP (community acquired pneumonia) - Plan: albuterol (PROVENTIL) (2.5 MG/3ML) 0.083% nebulizer solution 2.5 mg, DISCONTINUED: albuterol (PROVENTIL) (2.5 MG/3ML) 0.083% nebulizer solution 2.5 mg  Bronchospasm - Plan: albuterol (PROVENTIL) (2.5 MG/3ML) 0.083% nebulizer solution 2.5 mg, predniSONE (DELTASONE) 20 MG tablet  Cough - Plan: albuterol (PROVENTIL HFA;VENTOLIN HFA) 108 (90 Base) MCG/ACT inhaler, chlorpheniramine-HYDROcodone (TUSSIONEX PENNKINETIC ER) 10-8 MG/5ML SUER  Here today to follow-up recent dx of CAP She is a bit better but still has wheezing and cough Will have her use albuterol at home as needed, and gave rx for tussionex- cautioned regarding sedation If  these measures are not helpful she will start prednisone See patient instructions for more details.  Advised her that taking the gabapentin TID will work better for her    Signed [name], MD

## 2015-09-24 ENCOUNTER — Telehealth: Payer: Self-pay | Admitting: Family Medicine

## 2015-09-24 DIAGNOSIS — R059 Cough, unspecified: Secondary | ICD-10-CM

## 2015-09-24 DIAGNOSIS — R05 Cough: Secondary | ICD-10-CM

## 2015-09-24 NOTE — Telephone Encounter (Signed)
Relation to [id number] Call back number: [phone] Pharmacy:  Reason for call:  Patient wanted to inform PCP her cough is improving and she's almost out of the HYDROcodone-homatropine (HYCODAN) 5-1.5 MG/5ML syrup.Patient states she didn't fill the predniSONE (DELTASONE) 20 MG tablet  due to not wanting her A1C level rising.

## 2015-09-25 MED ORDER — HYDROCOD POLST-CPM POLST ER 10-8 MG/5ML PO SUER: 5.0000 mL | Freq: Two times a day (BID) | ORAL | Status: DC | PRN

## 2015-09-25 NOTE — Telephone Encounter (Signed)
Called her back- her cough is better- she is using the albuterol inhaler.  She would like a refill of her tussionex.  Will give a small amount for her to pick up

## 2015-11-25 ENCOUNTER — Encounter: Payer: Self-pay | Admitting: Neurology

## 2015-11-25 ENCOUNTER — Telehealth: Payer: Self-pay | Admitting: Neurology

## 2015-11-25 ENCOUNTER — Ambulatory Visit (INDEPENDENT_AMBULATORY_CARE_PROVIDER_SITE_OTHER): Payer: BLUE CROSS/BLUE SHIELD | Admitting: Neurology

## 2015-11-25 VITALS — BP 192/80 | HR 66 | Ht 65.5 in | Wt 221.6 lb

## 2015-11-25 DIAGNOSIS — G5722 Lesion of femoral nerve, left lower limb: Secondary | ICD-10-CM

## 2015-11-25 DIAGNOSIS — M5416 Radiculopathy, lumbar region: Secondary | ICD-10-CM

## 2015-11-25 DIAGNOSIS — G5712 Meralgia paresthetica, left lower limb: Secondary | ICD-10-CM | POA: Diagnosis not present

## 2015-11-25 DIAGNOSIS — G549 Nerve root and plexus disorder, unspecified: Secondary | ICD-10-CM

## 2015-11-25 DIAGNOSIS — M792 Neuralgia and neuritis, unspecified: Secondary | ICD-10-CM | POA: Diagnosis not present

## 2015-11-25 DIAGNOSIS — R29898 Other symptoms and signs involving the musculoskeletal system: Secondary | ICD-10-CM

## 2015-11-25 MED ORDER — METHYLPREDNISOLONE 4 MG PO TBPK: ORAL_TABLET | ORAL | Status: DC

## 2015-11-25 MED ORDER — GABAPENTIN 300 MG PO CAPS: 900.0000 mg | ORAL_CAPSULE | Freq: Three times a day (TID) | ORAL | Status: DC

## 2015-11-25 NOTE — Telephone Encounter (Signed)
Called pharmacy. Clarified rx gabapentin should read instructions 3 capsules 3x/day per Dr Jaynee Eagles OV note. Spoke to [place]. She verbalized understanding.

## 2015-11-25 NOTE — Telephone Encounter (Signed)
Called pharmacy. Clarified rx gabapentin should read instructions 3 capsules 3x/day per Dr Jaynee Eagles OV note. Spoke to [place]. She verbalized understanding.

## 2015-11-25 NOTE — Telephone Encounter (Signed)
Please call [place] at [facility] to clarify the instructions for dosage of gabapentin.  Thanks!

## 2015-11-25 NOTE — Telephone Encounter (Signed)
Pt called said RX for steroid had been sent to pharmacy but pharmacy has faxed it back to clinic stating it had 2 different instructions. Please clarify and call pharmacy. Pt is requesting to pick it up today.

## 2015-11-25 NOTE — Progress Notes (Addendum)
[facility]    Provider:  Dr Jaynee Eagles Referring Provider: Lorelei Pont Gay Filler, MD Primary Care Physician:  [name], MD   CC: Left leg numbness  Interval history 11/25/2015:  Still with pain in the left thigh. No weakness. Just still burning and pain. Pins and needles. The pain is constant night and day. Not getting any better. Neurontin and topical lidocaine didn't work at all. No back pain. Last hgba1c was 6.3.  HPI: Emily Wood is a 65 y.o. female here as a referral from Dr. Lorelei Pont for leg pain. PMHx diabetes, obesity. Symptoms started in November. Started on the left lateral thigh, progressive. From the hip to the anterolateral knee. Burning, tingling, like something crawling on her thigh. Continuous, not positional. She lost weight rapidly prior to the symptoms starting. Also stabbing pain. She was diagnosed with Diabetes in September and she lost 40 pounds in the timeframe she started having these symptoms. The muscle relaxer and the pain killer helps, has not tried the gabapentin. No weakness. No back pain, no radicular symptoms. No other focal neurologic symptoms.  Reviewed notes, labs and imaging from outside physicians, which showed:  XR of the hip 03/2015 personally reviewed images and agree with the following: : No acute fracture, subluxation or dislocation.  The joint space is unremarkable.  No focal bony lesions are identified.  IMPRESSION: Negative.  BMP 1/31 unremarkable creat 1.01, hgba1c 6.3  Review of Systems: Patient complains of symptoms per HPI as well as the following symptoms: swellin gin legs, itching, numbness. Pertinent negatives per HPI. All others negative.   Social History   Social History  . Marital Status: Married    Spouse Name: [name]  . Number of Children: 0  . Years of Education: 16   Occupational History  . Unemployed    Social History Main Topics  . Smoking status: Never Smoker   . Smokeless tobacco:  Never Used  . Alcohol Use: No  . Drug Use: No  . Sexual Activity: Not on file   Other Topics Concern  . Not on file   Social History Narrative   Lives with husband   Caffeine use: 16oz coffee per day    History reviewed. No pertinent family history.  Past Medical History  Diagnosis Date  . Hypertension   . Diabetes mellitus without complication [facility])     Past Surgical History  Procedure Laterality Date  . Partial hysterectomy      Still has ovaries  . Cholecystectomy    . Uterine fibroid surgery    . Hernia repair    . Other surgical history      scar tissue removal from bowels    Current Outpatient Prescriptions  Medication Sig Dispense Refill  . aspirin 81 MG tablet Take 81 mg by mouth daily.    . furosemide (LASIX) 40 MG tablet Take 1 tablet (40 mg total) by mouth daily. 90 tablet 1  . gabapentin (NEURONTIN) 300 MG capsule Take 3 capsules daily. 270 capsule 1  . losartan-hydrochlorothiazide (HYZAAR) 100-25 MG tablet Take 1 tablet by mouth daily. 90 tablet 1  . metFORMIN (GLUCOPHAGE) 500 MG tablet Take 1 tablet (500 mg total) by mouth 2 (two) times daily with a meal. 180 tablet 2  . metoprolol (LOPRESSOR) 50 MG tablet Take 1 tablet (50 mg total) by mouth 2 (two) times daily. 180 tablet 3  . potassium chloride SA (K-DUR,KLOR-CON) 20 MEQ tablet Take 1 tablet (20 mEq total) by mouth daily. 90 tablet 3  .  chlorpheniramine-HYDROcodone (TUSSIONEX PENNKINETIC ER) 10-8 MG/5ML SUER Take 5 mLs by mouth every 12 (twelve) hours as needed for cough. (Patient not taking: Reported on 11/25/2015) 50 mL 0  . lidocaine (XYLOCAINE) 5 % ointment Apply 1 application topically as needed. (Patient not taking: Reported on 11/25/2015) 35.44 g 12   No current facility-administered medications for this visit.    Allergies as of 11/25/2015  . (No Known Allergies)    Vitals: BP 192/80 mmHg  Pulse 66  Ht 5' 5.5" (1.664 m)  Wt 221 lb 9.6 oz (100.517 kg)  BMI 36.30 kg/m2  SpO2 99% Last  Weight:  Wt Readings from Last 1 Encounters:  11/25/15 221 lb 9.6 oz (100.517 kg)   Last Height:   Ht Readings from Last 1 Encounters:  11/25/15 5' 5.5" (1.664 m)     Physical exam: Exam: Gen: NAD, conversant, well nourised, obese, well groomed  CV: RRR, no MRG. No Carotid Bruits. No peripheral edema, warm, nontender Eyes: Conjunctivae clear without exudates or hemorrhage  Neuro: Detailed Neurologic Exam  Speech:  Speech is normal; fluent and spontaneous with normal comprehension.  Cognition:  The patient is oriented to person, place, and time;   recent and remote memory intact;   language fluent;   normal attention, concentration,   fund of knowledge Cranial Nerves:  The pupils are equal, round, and reactive to light. The fundi are normal and spontaneous venous pulsations are present. Visual fields are full to finger confrontation. Extraocular movements are intact. Trigeminal sensation is intact and the muscles of mastication are normal. The face is symmetric. The palate elevates in the midline. Hearing intact. Voice is normal. Shoulder shrug is normal. The tongue has normal motion without fasciculations.   Coordination:  Normal finger to nose and heel to shin. Normal rapid alternating movements.   Gait:  wide based large body habitus  Motor Observation:  No asymmetry, no atrophy, and no involuntary movements noted. Tone:  Normal muscle tone.   Posture:  Posture is normal. normal erect   Strength: Left hip flexion 3/5 otherwise strength is V/V in the upper and lower limbs.    Sensation: dec sensation in the anterolateral left thigh in the distribution of the lateral femoral cutaneous nerve   Reflex Exam:  DTR's:  Absent AJs otherwise deep tendon reflexes in the upper and lower extremities are normal bilaterally.  Toes:  The toes are downgoing bilaterally.  Clonus:  Clonus is  absent.      Assessment/Plan: 65 year old patient with sensory changes in the distribution of the left lateral femoral cutaneous nerve, or Meralgia Paresthetica. She has weakness of left hip flexion, may consider radiculopathy or femoral neuropathy. Also has diabetes, may consider diabetic amyotrophy..  As far as your medications are concerned, I would like to suggest Neurontin/gabapentin: will increase it. Insurance did not approve Lyrica.  lidoderm patches up to 2 at a time, can wear for 12 hours a day: insurance did not approve, lidocaine topical did not help  To evaluate: She declines emg/ncs for now.  MRI lumbar spine and MRI pelvis to evaluate for any radiculopathy or femoral neuropathy Medrol dosepak Discussed nerve blocks, if workup negative will refer to pain management for procedure on the lateral femoral cutaneous nerve   Sarina Ill, MD  [facility] [address] [place], [place] [address]  Phone [phone] Fax [phone]  A total of 30 minutes was spent face-to-face with this patient. Over half this time was spent on counseling patient  on the meralgia parestheticadiagnosis and different diagnostic and therapeutic options available.

## 2015-11-25 NOTE — Patient Instructions (Addendum)
As far as your medications are concerned, I would like to suggest: Increase Neurontin/Gabapentin to 2 pills three times a day and then to 3 pills three times a day Medrol dosepak: take pills daily with food Mri of the lumbar spine and pelvis Referral to pain management for procedure  Our phone number is [phone]. We also have an after hours call service for urgent matters and there is a physician on-call for urgent questions. For any emergencies you know to call 911 or go to the nearest emergency room

## 2015-12-04 ENCOUNTER — Ambulatory Visit
Admission: RE | Admit: 2015-12-04 | Discharge: 2015-12-04 | Disposition: A | Discharge status: Home / Self Care | Payer: BLUE CROSS/BLUE SHIELD | Source: Ambulatory Visit | Attending: Neurology | Admitting: Neurology

## 2015-12-04 DIAGNOSIS — M5416 Radiculopathy, lumbar region: Secondary | ICD-10-CM | POA: Diagnosis not present

## 2015-12-04 DIAGNOSIS — R29898 Other symptoms and signs involving the musculoskeletal system: Secondary | ICD-10-CM

## 2015-12-04 DIAGNOSIS — M792 Neuralgia and neuritis, unspecified: Secondary | ICD-10-CM

## 2015-12-04 DIAGNOSIS — G549 Nerve root and plexus disorder, unspecified: Secondary | ICD-10-CM

## 2015-12-04 DIAGNOSIS — G5722 Lesion of femoral nerve, left lower limb: Secondary | ICD-10-CM

## 2015-12-04 MED ORDER — GADOBENATE DIMEGLUMINE 529 MG/ML IV SOLN
20.0000 mL | Freq: Once | INTRAVENOUS | Status: AC | PRN
  Administered 2015-12-04: 20 mL via INTRAVENOUS

## 2015-12-09 ENCOUNTER — Telehealth: Payer: Self-pay | Admitting: *Deleted

## 2015-12-09 NOTE — Telephone Encounter (Signed)
-----   Message from Melvenia Beam, MD sent at 12/06/2015  4:00 PM EDT ----- Let patient know that the MRI of the pelvis showed some tendinosis of the right hamstring origin but this would cause pain in the buttocks and back of the leg and not in the area that patient is having trouble with. The MRI of the lumbar spine showed arthritis and degenerative changes but no nerve root pinching. Thanks

## 2015-12-09 NOTE — Telephone Encounter (Signed)
LVM for pt to call about results. Gave [facility] phone number.  

## 2015-12-10 NOTE — Telephone Encounter (Signed)
Called and spoke to pt about MRI results per Dr Jaynee Eagles note. Pt verbalized understanding. Emily Wood would like to come in for OV to review imaging with Dr Jaynee Eagles. Made f/u for 12/19/15 at 10am, check in 945am.

## 2015-12-10 NOTE — Telephone Encounter (Signed)
For: OFC/EMMA             Taken 23-MAY-17 at  4:15PM by [facility] ------------------------------------------------------------ Emily Wood          CID [id number]  Patient SAME                 Pt's Dr [facility]        Area Code [id number] Phone# [phone] RE:MRI RESULTS     IF CB IN AM USE HOME PHONE#[phone] [place]              Disp:Y/N N If Y = C/B If No Response In 22minutes ============================================================

## 2015-12-19 ENCOUNTER — Ambulatory Visit: Payer: Self-pay | Admitting: Neurology

## 2015-12-19 ENCOUNTER — Telehealth: Payer: Self-pay | Admitting: *Deleted

## 2015-12-19 NOTE — Telephone Encounter (Signed)
no showed f/u appt

## 2015-12-19 NOTE — Telephone Encounter (Signed)
  FYI- Per Jilda Roche did arrive 30 min late. Said she had a water leak   She resch for June 19th and Angie waived her no show fee as she did make an effort to get here.

## 2015-12-19 NOTE — Telephone Encounter (Signed)
Message For: OFFICE               Taken  1-JUN-17 at  9:21AM by KSK ------------------------------------------------------------ Emily Wood         CID [id number]  Patient SAME                 Pt's Dr Jaynee Eagles        Area Code [id number] Phone# [place] / WILL BE THERE BY      10AM                                                 Disp:Y/N N If Y = C/B If No Response In 58minutes ============================================================

## 2015-12-20 ENCOUNTER — Encounter: Payer: Self-pay | Admitting: Neurology

## 2015-12-26 ENCOUNTER — Ambulatory Visit: Payer: BLUE CROSS/BLUE SHIELD | Admitting: Neurology

## 2016-01-06 ENCOUNTER — Ambulatory Visit: Payer: BLUE CROSS/BLUE SHIELD | Admitting: Neurology

## 2016-01-08 ENCOUNTER — Telehealth: Payer: Self-pay | Admitting: Neurology

## 2016-01-08 NOTE — Telephone Encounter (Signed)
Hinton Dyer,  I referred this patient to Dr. Illene Labrador for Meralgia Paresthetica(a pinched nerve in her thigh). She is on my schedule on the 28th, not sure I have any more to offer her. She needs pain management and possible pain procedure. Did she get an appointment? She came back to see me the other day but was late so I couldn't see her. Can you find out if she has been scheduled yet with Dr. Hardin Negus? Terrence Dupont, just fyi)Thanks

## 2016-01-09 NOTE — Telephone Encounter (Signed)
Called Patient and left her a message asking her to call me back.

## 2016-01-09 NOTE — Telephone Encounter (Signed)
Spoke to patient and Emily Wood from Dr. Hardin Negus office they will be scheduling Emily Wood sometime next week. Dr. Myles Rosenthal has been reviewing  Emily Wood records. Patient is aware and is fine with this.  Patient will keep Emily Wood upcoming apt. With Dr. Jaynee Eagles for MRI results only.

## 2016-01-09 NOTE — Telephone Encounter (Signed)
Called Ryan at Dr. Nicholaus Bloom office to see if patient had been scheduled left [place] a message.

## 2016-01-15 ENCOUNTER — Encounter: Payer: Self-pay | Admitting: Neurology

## 2016-01-15 ENCOUNTER — Ambulatory Visit (INDEPENDENT_AMBULATORY_CARE_PROVIDER_SITE_OTHER): Payer: BLUE CROSS/BLUE SHIELD | Admitting: Neurology

## 2016-01-15 ENCOUNTER — Telehealth: Payer: Self-pay | Admitting: Neurology

## 2016-01-15 VITALS — BP 188/85 | HR 71 | Ht 65.5 in | Wt 226.6 lb

## 2016-01-15 DIAGNOSIS — G5712 Meralgia paresthetica, left lower limb: Secondary | ICD-10-CM | POA: Diagnosis not present

## 2016-01-15 MED ORDER — PREGABALIN 150 MG PO CAPS: 150.0000 mg | ORAL_CAPSULE | Freq: Two times a day (BID) | ORAL | Status: DC

## 2016-01-15 NOTE — Patient Instructions (Signed)
Remember to drink plenty of fluid, eat healthy meals and do not skip any meals. Try to eat protein with a every meal and eat a healthy snack such as fruit or nuts in between meals. Try to keep a regular sleep-wake schedule and try to exercise daily, particularly in the form of walking, 20-30 minutes a day, if you can.   As far as your medications are concerned, I would like to suggest: Stop Neurontin try Lyrica  I would like to see you back after physical therapy and if ni relief with pain management, sooner if we need to. Please call us with any interim questions, concerns, problems, updates or refill requests.   Our phone number is [phone]. We also have an after hours call service for urgent matters and there is a physician on-call for urgent questions. For any emergencies you know to call 911 or go to the nearest emergency room  Pregabalin capsules What is this medicine? PREGABALIN (pre GAB a lin) is used to treat nerve pain from diabetes, shingles, spinal cord injury, and fibromyalgia. It is also used to control seizures in epilepsy. This medicine may be used for other purposes; ask your health care provider or pharmacist if you have questions. What should I tell my health care provider before I take this medicine? They need to know if you have any of these conditions: -bleeding problems -heart disease, including heart failure -history of alcohol or drug abuse -kidney disease -suicidal thoughts, plans, or attempt; a previous suicide attempt by you or a family member -an unusual or allergic reaction to pregabalin, gabapentin, other medicines, foods, dyes, or preservatives -pregnant or trying to get pregnant or trying to conceive with your partner -breast-feeding How should I use this medicine? Take this medicine by mouth with a glass of water. Follow the directions on the prescription label. You can take this medicine with or without food. Take your doses at regular intervals. Do not  take your medicine more often than directed. Do not stop taking except on your doctor's advice. A special MedGuide will be given to you by the pharmacist with each prescription and refill. Be sure to read this information carefully each time. Talk to your pediatrician regarding the use of this medicine in children. Special care may be needed. Overdosage: If you think you have taken too much of this medicine contact a poison control center or emergency room at once. NOTE: This medicine is only for you. Do not share this medicine with others. What if I miss a dose? If you miss a dose, take it as soon as you can. If it is almost time for your next dose, take only that dose. Do not take double or extra doses. What may interact with this medicine? -alcohol -certain medicines for blood pressure like captopril, enalapril, or lisinopril -certain medicines for diabetes, like pioglitazone or rosiglitazone -certain medicines for anxiety or sleep -narcotic medicines for pain This list may not describe all possible interactions. Give your health care provider a list of all the medicines, herbs, non-prescription drugs, or dietary supplements you use. Also tell them if you smoke, drink alcohol, or use illegal drugs. Some items may interact with your medicine. What should I watch for while using this medicine? Tell your doctor or healthcare professional if your symptoms do not start to get better or if they get worse. Visit your doctor or health care professional for regular checks on your progress. Do not stop taking except on your doctor's advice. You may  develop a severe reaction. Your doctor will tell you how much medicine to take. Wear a medical identification bracelet or chain if you are taking this medicine for seizures, and carry a card that describes your disease and details of your medicine and dosage times. You may get drowsy or dizzy. Do not drive, use machinery, or do anything that needs mental  alertness until you know how this medicine affects you. Do not stand or sit up quickly, especially if you are an older patient. This reduces the risk of dizzy or fainting spells. Alcohol may interfere with the effect of this medicine. Avoid alcoholic drinks. If you have a heart condition, like congestive heart failure, and notice that you are retaining water and have swelling in your hands or feet, contact your health care provider immediately. The use of this medicine may increase the chance of suicidal thoughts or actions. Pay special attention to how you are responding while on this medicine. Any worsening of mood, or thoughts of suicide or dying should be reported to your health care professional right away. This medicine has caused reduced sperm counts in some men. This may interfere with the ability to father a child. You should talk to your doctor or health care professional if you are concerned about your fertility. Women who become pregnant while using this medicine for seizures may enroll in the [place] Pregnancy Registry by calling [phone]. This registry collects information about the safety of antiepileptic drug use during pregnancy. What side effects may I notice from receiving this medicine? Side effects that you should report to your doctor or health care professional as soon as possible: -allergic reactions like skin rash, itching or hives, swelling of the face, lips, or tongue -breathing problems -changes in vision -chest pain -confusion -jerking or unusual movements of any part of your body -loss of memory -muscle pain, tenderness, or weakness -suicidal thoughts or other mood changes -swelling of the ankles, feet, hands -unusual bruising or bleeding Side effects that usually do not require medical attention (Report these to your doctor or health care professional if they continue or are bothersome.): -dizziness -drowsiness -dry  mouth -headache -nausea -tremors -trouble sleeping -weight gain This list may not describe all possible side effects. Call your doctor for medical advice about side effects. You may report side effects to FDA at 1-800-FDA-1088. Where should I keep my medicine? Keep out of the reach of children. This medicine can be abused. Keep your medicine in a safe place to protect it from theft. Do not share this medicine with anyone. Selling or giving away this medicine is dangerous and against the law. This medicine may cause accidental overdose and death if it taken by other adults, children, or pets. Mix any unused medicine with a substance like cat litter or coffee grounds. Then throw the medicine away in a sealed container like a sealed bag or a coffee can with a lid. Do not use the medicine after the expiration date. Store at room temperature between 15 and 30 degrees C (59 and 86 degrees F). NOTE: This sheet is a summary. It may not cover all possible information. If you have questions about this medicine, talk to your doctor, pharmacist, or health care provider.    2016, Elsevier/Gold Standard. (2013-09-01 15:38:53)

## 2016-01-15 NOTE — Progress Notes (Signed)
[facility]    Provider:  Dr Jaynee Eagles Referring Provider: Lorelei Pont Gay Filler, MD Primary Care Physician:  [name], MD  CC: Left leg numbness  Interval history: Still burning and numb int the left thigh in the   Interval history 11/25/2015: Still with pain in the left thigh. No weakness. Just still burning and pain. Pins and needles. The pain is constant night and day. Not getting any better. Neurontin and topical lidocaine didn't work at all. No back pain. Last hgba1c was 6.3.  Reviewed imaging with patient in detail showed imaging. There was no indication of radiculopathy. I still do think that this is meralgia paresthetica. Provided her more documentation with meralgia paresthetica. Recommended referral to pain management for possible pain procedures.  MRI lumbar spine 12/05/2015:  Abnormal MRI lumbar spine (without) demonstrating: 1. At L3-4: disc bulging and facet and ligamentum flavum hypertrophy with mild spinal stenosis and moderate biforaminal stenosis. 2. At L4-5: disc bulging and facet and ligamentum flavum hypertrophy with moderate biforaminal stenosis; fluid in facet joints noted. 3. Incidental 3 cm pelvic cyst noted.   HPI: Emily Wood is a 65 y.o. female here as a referral from Dr. Lorelei Pont for leg pain. PMHx diabetes, obesity. Symptoms started in November. Started on the left lateral thigh, progressive. From the hip to the anterolateral knee. Burning, tingling, like something crawling on her thigh. Continuous, not positional. She lost weight rapidly prior to the symptoms starting. Also stabbing pain. She was diagnosed with Diabetes in September and she lost 40 pounds in the timeframe she started having these symptoms. The muscle relaxer and the pain killer helps, has not tried the gabapentin. No weakness. No back pain, no radicular symptoms. No other focal neurologic symptoms.  Reviewed notes, labs and imaging from outside physicians, which  showed:  XR of the hip 03/2015 personally reviewed images and agree with the following: : No acute fracture, subluxation or dislocation.  The joint space is unremarkable.  No focal bony lesions are identified.  IMPRESSION: Negative.  BMP 1/31 unremarkable creat 1.01, hgba1c 6.3  Review of Systems: Patient complains of symptoms per HPI as well as the following symptoms: swellin gin legs, itching, numbness. Pertinent negatives per HPI. All others negative.  Social History   Social History  . Marital Status: Married    Spouse Name: [name]  . Number of Children: 0  . Years of Education: 16   Occupational History  . Unemployed    Social History Main Topics  . Smoking status: Never Smoker   . Smokeless tobacco: Never Used  . Alcohol Use: No  . Drug Use: No  . Sexual Activity: Not on file   Other Topics Concern  . Not on file   Social History Narrative   Lives with husband   Caffeine use: 16oz coffee per day    History reviewed. No pertinent family history.  Past Medical History  Diagnosis Date  . Hypertension   . Diabetes mellitus without complication [facility])     Past Surgical History  Procedure Laterality Date  . Partial hysterectomy      Still has ovaries  . Cholecystectomy    . Uterine fibroid surgery    . Hernia repair    . Other surgical history      scar tissue removal from bowels    Current Outpatient Prescriptions  Medication Sig Dispense Refill  . aspirin 81 MG tablet Take 81 mg by mouth daily.    . chlorpheniramine-HYDROcodone (TUSSIONEX PENNKINETIC ER) 10-8 MG/5ML SUER  Take 5 mLs by mouth every 12 (twelve) hours as needed for cough. 50 mL 0  . furosemide (LASIX) 40 MG tablet Take 1 tablet (40 mg total) by mouth daily. 90 tablet 1  . gabapentin (NEURONTIN) 300 MG capsule Take 3 capsules (900 mg total) by mouth 3 (three) times daily. Take 3 capsules daily. 270 capsule 11  . losartan-hydrochlorothiazide (HYZAAR) 100-25 MG tablet Take 1 tablet by  mouth daily. 90 tablet 1  . metFORMIN (GLUCOPHAGE) 500 MG tablet Take 1 tablet (500 mg total) by mouth 2 (two) times daily with a meal. 180 tablet 2  . metoprolol (LOPRESSOR) 50 MG tablet Take 50 mg by mouth 2 (two) times daily.    . potassium chloride SA (K-DUR,KLOR-CON) 20 MEQ tablet Take 1 tablet (20 mEq total) by mouth daily. 90 tablet 3   No current facility-administered medications for this visit.    Allergies as of 01/15/2016  . (No Known Allergies)    Vitals: BP 188/85 mmHg  Pulse 71  Ht 5' 5.5" (1.664 m)  Wt 226 lb 9.6 oz (102.785 kg)  BMI 37.12 kg/m2 Last Weight:  Wt Readings from Last 1 Encounters:  01/15/16 226 lb 9.6 oz (102.785 kg)   Last Height:   Ht Readings from Last 1 Encounters:  01/15/16 5' 5.5" (1.664 m)       Neuro: Detailed Neurologic Exam  Speech:  Speech is normal; fluent and spontaneous with normal comprehension.  Cognition:  The patient is oriented to person, place, and time;   recent and remote memory intact;   language fluent;   normal attention, concentration,   fund of knowledge Cranial Nerves:  The pupils are equal, round, and reactive to light. The fundi are normal and spontaneous venous pulsations are present. Visual fields are full to finger confrontation. Extraocular movements are intact. Trigeminal sensation is intact and the muscles of mastication are normal. The face is symmetric. The palate elevates in the midline. Hearing intact. Voice is normal. Shoulder shrug is normal. The tongue has normal motion without fasciculations.   Coordination:  Normal finger to nose and heel to shin. Normal rapid alternating movements.   Gait:  wide based large body habitus  Motor Observation:  No asymmetry, no atrophy, and no involuntary movements noted. Tone:  Normal muscle tone.   Posture:  Posture is normal. normal erect   Strength: Left hip flexion 3/5 otherwise strength is V/V in the upper and lower  limbs.    Sensation: dec sensation in the anterolateral left thigh in the distribution of the lateral femoral cutaneous nerve   Reflex Exam:  DTR's:  Absent AJs otherwise deep tendon reflexes in the upper and lower extremities are normal bilaterally.  Toes:  The toes are downgoing bilaterally.  Clonus:  Clonus is absent.      Assessment/Plan: 65 year old patient with sensory changes in the distribution of the left lateral femoral cutaneous nerve, or Meralgia Paresthetica. She has weakness of left hip flexion, may consider radiculopathy or femoral neuropathy. Also has diabetes, may consider diabetic amyotrophy..  As far as your medications are concerned, I would like to suggest Neurontin/gabapentin: will increase it. Insurance did not approve Lyrica.  lidoderm patches up to 2 at a time, can wear for 12 hours a day: insurance did not approve, lidocaine topical did not help  To evaluate: She declines emg/ncs for now.  MRI lumbar spine negative for radiculopathy Will refer to pain management for procedure on the lateral femoral cutaneous nerve Stop Neurontin,  start Lyrica. Discussed side effects as documented in the patient instructions. Refer to physical therapy to see if stretching exercises can help her as well.   Sarina Ill, MD  [facility] [address] [place], [place] [address]  Phone [phone] Fax [phone]  A total of 30 minutes was spent face-to-face with this patient. Over half this time was spent on counseling patient on the meralgia parestheticadiagnosis and different diagnostic and therapeutic options available.

## 2016-01-15 NOTE — Telephone Encounter (Signed)
Called Ryan back at Dr. Hardin Negus office to see what status is Thurmond Butts should have called patient last week to schedule.

## 2016-01-15 NOTE — Telephone Encounter (Signed)
Ryan called back and Relayed he tried to call patient last week left messages for her. Patient has not called back yet.  I will call patient and leave her a message as well asking her to call Thurmond Butts back with Dr. Hardin Negus office telephone [phone] opt. 4

## 2016-02-21 ENCOUNTER — Other Ambulatory Visit: Payer: Self-pay | Admitting: Family Medicine

## 2016-02-21 ENCOUNTER — Other Ambulatory Visit: Payer: Self-pay | Admitting: Emergency Medicine

## 2016-02-21 DIAGNOSIS — R609 Edema, unspecified: Secondary | ICD-10-CM

## 2016-02-21 MED ORDER — FUROSEMIDE 40 MG PO TABS: 40.0000 mg | ORAL_TABLET | Freq: Every day | ORAL | 1 refills | Status: DC

## 2016-04-03 ENCOUNTER — Other Ambulatory Visit: Payer: Self-pay | Admitting: Family Medicine

## 2016-04-06 ENCOUNTER — Other Ambulatory Visit: Payer: Self-pay | Admitting: Emergency Medicine

## 2016-04-08 ENCOUNTER — Other Ambulatory Visit: Payer: Self-pay | Admitting: Emergency Medicine

## 2016-05-28 ENCOUNTER — Telehealth: Payer: Self-pay | Admitting: Family Medicine

## 2016-05-28 MED ORDER — GUAIFENESIN-CODEINE 100-10 MG/5ML PO SYRP: 5.0000 mL | ORAL_SOLUTION | Freq: Three times a day (TID) | ORAL | 0 refills | Status: DC | PRN

## 2016-05-28 NOTE — Telephone Encounter (Signed)
Called her back- she notes a cough. She had pneumonia back in March  However this time she is not ill like she did at that time.  she has no fever, she did have chills yesterday but none today. No unusual aches She has an inhaler that she might like to use ; she was not sure if this was ok.advised her that this is ok to use She did well with tussionex last year but does not really have time to come and get an rx- I will call in Cheratussin for her instead. She will let me know if not feeling better soon- Sooner if worse.

## 2016-05-28 NOTE — Telephone Encounter (Signed)
Patient is calling stating that she is having the same symptoms that she was having when she was seen back in March. She states that there was a cough syrup, chlorpheniramine-HYDROcodone (TUSSIONEX PENNKINETIC ER) 10-8 MG/5ML SUER  prescribed that helped and wanted to know if she could get that refilled. She also stated that she still has the inhaler (proair) and that is still in date, she is wondering if she should start using that again? Please advise.     Pharmacy: [facility] [place], [place] - [address] [place]  Patient phone: [phone]

## 2016-06-04 ENCOUNTER — Ambulatory Visit (INDEPENDENT_AMBULATORY_CARE_PROVIDER_SITE_OTHER): Payer: BLUE CROSS/BLUE SHIELD | Admitting: Family Medicine

## 2016-06-04 VITALS — BP 145/85 | HR 79 | Temp 98.2°F | Ht 65.5 in | Wt 232.8 lb

## 2016-06-04 DIAGNOSIS — R609 Edema, unspecified: Secondary | ICD-10-CM

## 2016-06-04 DIAGNOSIS — Z13220 Encounter for screening for lipoid disorders: Secondary | ICD-10-CM

## 2016-06-04 DIAGNOSIS — E119 Type 2 diabetes mellitus without complications: Secondary | ICD-10-CM | POA: Diagnosis not present

## 2016-06-04 DIAGNOSIS — I10 Essential (primary) hypertension: Secondary | ICD-10-CM

## 2016-06-04 DIAGNOSIS — J9801 Acute bronchospasm: Secondary | ICD-10-CM

## 2016-06-04 DIAGNOSIS — R05 Cough: Secondary | ICD-10-CM

## 2016-06-04 DIAGNOSIS — R059 Cough, unspecified: Secondary | ICD-10-CM

## 2016-06-04 MED ORDER — HYDROCOD POLST-CPM POLST ER 10-8 MG/5ML PO SUER: 5.0000 mL | Freq: Two times a day (BID) | ORAL | 0 refills | Status: DC | PRN

## 2016-06-04 MED ORDER — POTASSIUM CHLORIDE CRYS ER 20 MEQ PO TBCR: 20.0000 meq | EXTENDED_RELEASE_TABLET | Freq: Every day | ORAL | 3 refills | Status: DC

## 2016-06-04 MED ORDER — ALBUTEROL SULFATE 108 (90 BASE) MCG/ACT IN AEPB: 2.0000 | INHALATION_SPRAY | Freq: Four times a day (QID) | RESPIRATORY_TRACT | 3 refills | Status: DC | PRN

## 2016-06-04 MED ORDER — METOPROLOL TARTRATE 50 MG PO TABS: 50.0000 mg | ORAL_TABLET | Freq: Two times a day (BID) | ORAL | 3 refills | Status: DC

## 2016-06-04 MED ORDER — METFORMIN HCL 500 MG PO TABS: 500.0000 mg | ORAL_TABLET | Freq: Two times a day (BID) | ORAL | 2 refills | Status: DC

## 2016-06-04 MED ORDER — BENZONATATE 100 MG PO CAPS: 100.0000 mg | ORAL_CAPSULE | Freq: Two times a day (BID) | ORAL | 0 refills | Status: DC | PRN

## 2016-06-04 MED ORDER — FLUNISOLIDE HFA 80 MCG/ACT IN AERS: INHALATION_SPRAY | RESPIRATORY_TRACT | 2 refills | Status: DC

## 2016-06-04 NOTE — Progress Notes (Signed)
Pre visit review using our clinic review tool, if applicable. No additional management support is needed unless otherwise documented below in the visit note. 

## 2016-06-04 NOTE — Patient Instructions (Signed)
We are going to treat you for bronchospasm with 2 inhalers Aerospan- this is an inhaled steroid. Use 2 puffs twice a day Pro- air: this is albuterol.  Take 2 puffs every 6 hours as needed for cough and wheezing  Tessalon perles are for cough  Please come in for a lab visit FASTING in the next few weeks for labs  Let me know if you are not feeling better in the next 3-4 days- Sooner if worse.

## 2016-06-04 NOTE — Progress Notes (Signed)
[place] at [facility] [address] [place], [place] [address] [phone] [phone]  Date:  06/04/2016   Name:  Gionni Kondracki   DOB:  [birth year]   MRN:  [id number]  PCP:  [name], MD    Chief Complaint: Cough (c/o dry cough x 2 week. Pt states that coughing talk and breathe)   History of Present Illness:  Emily Wood is a 65 y.o. very pleasant female patient who presents with the following:  History of controlled DM, HTN, meralgia paresthetica She is taking lasix, neurontin, hyzaar, metformin, Kdur, lyrica, metoprolol  Lab Results  Component Value Date   HGBA1C 6.3 08/02/2015   Wt Readings from Last 3 Encounters:  06/04/16 232 lb 12.8 oz (105.6 kg)  01/15/16 226 lb 9.6 oz (102.8 kg)  11/25/15 221 lb 9.6 oz (100.5 kg)   Weight in January was 213 lbs She is upset about her recent weight gain- she notes that she has not been following her diet and has been eating too much.  She is disappointed in herself She has not been checking her glucose at home  She has noted a cough for about 2 weeks.   The cough is generally dry.   She will cough more when she tries to talk or in cold air No fever, she may notice chills at times but does not feel like she has the flu or is acutely ill  She lives at home with her husband.  No vomitng She has noted some soft stools but no diarrhea Appetite is good   Patient Active Problem List   Diagnosis Date Noted  . Meralgia paresthetica of left side 08/27/2015  . Diabetes mellitus type 2, controlled ([place]) 08/02/2015  . Chronic venous insufficiency 06/28/2015  . Essential hypertension 02/15/2015  . Peripheral edema 02/15/2015  . Chronic knee pain 02/15/2015    Past Medical History:  Diagnosis Date  . Diabetes mellitus without complication ([place])   . Hypertension     Past Surgical History:  Procedure Laterality Date  . CHOLECYSTECTOMY    . HERNIA REPAIR    . OTHER  SURGICAL HISTORY     scar tissue removal from bowels  . PARTIAL HYSTERECTOMY     Still has ovaries  . UTERINE FIBROID SURGERY      Social History  Substance Use Topics  . Smoking status: Never Smoker  . Smokeless tobacco: Never Used  . Alcohol use No    No family history on file.  No Known Allergies  Medication list has been reviewed and updated.  Current Outpatient Prescriptions on File Prior to Visit  Medication Sig Dispense Refill  . aspirin 81 MG tablet Take 81 mg by mouth daily.    . chlorpheniramine-HYDROcodone (TUSSIONEX PENNKINETIC ER) 10-8 MG/5ML SUER Take 5 mLs by mouth every 12 (twelve) hours as needed for cough. 50 mL 0  . furosemide (LASIX) 40 MG tablet Take 1 tablet (40 mg total) by mouth daily. 90 tablet 1  . gabapentin (NEURONTIN) 300 MG capsule Take 3 capsules (900 mg total) by mouth 3 (three) times daily. Take 3 capsules daily. 270 capsule 11  . losartan-hydrochlorothiazide (HYZAAR) 100-25 MG tablet TAKE 1 BY MOUTH DAILY 90 tablet 1  . metFORMIN (GLUCOPHAGE) 500 MG tablet Take 1 tablet (500 mg total) by mouth 2 (two) times daily with a meal. 180 tablet 2  . metoprolol (LOPRESSOR) 50 MG tablet Take 50 mg by mouth 2 (two) times daily.    Marland Kitchen  potassium chloride SA (K-DUR,KLOR-CON) 20 MEQ tablet Take 1 tablet (20 mEq total) by mouth daily. 90 tablet 3  . pregabalin (LYRICA) 150 MG capsule Take 1 capsule (150 mg total) by mouth 2 (two) times daily. 60 capsule 5   No current facility-administered medications on file prior to visit.     Review of Systems:  As per HPI- otherwise negative. She is having urge incont and has to wear a pad.  This has been the case for maybe a year or so.  She does not have cough or sneeze urine leakage.      Physical Examination:  Vitals:   06/04/16 1808  Weight: 232 lb 12.8 oz (105.6 kg)  Height: 5' 5.5" (1.664 m)   Body mass index is 38.15 kg/m. Ideal Body Weight: Weight in (lb) to have BMI = 25: 152.2  GEN: WDWN, NAD,  Non-toxic, A & O x 3, overweight, looks well, coughing some in room, dry cough HEENT: Atraumatic, Normocephalic. Neck supple. No masses, No LAD.  Bilateral TM wnl, oropharynx normal.  PEERL,EOMI.   Ears and Nose: No external deformity. CV: RRR, No M/G/R. No JVD. No thrill. No extra heart sounds. PULM: CTA B, no wheezes, crackles, rhonchi. No retractions. No resp. distress. No accessory muscle use. EXTR: No c/c/e NEURO Normal gait.  PSYCH: Normally interactive. Conversant. Not depressed or anxious appearing.  Calm demeanor.    Assessment and Plan: Cough - Plan: Flunisolide HFA 80 MCG/ACT AERS, Albuterol Sulfate (PROAIR RESPICLICK) 123XX123 (90 Base) MCG/ACT AEPB, benzonatate (TESSALON) 100 MG capsule  Bronchospasm - Plan: Albuterol Sulfate (PROAIR RESPICLICK) 123XX123 (90 Base) MCG/ACT AEPB  Controlled type 2 diabetes mellitus without complication, without long-term current use of insulin (HCC) - Plan: Hemoglobin A1C  Essential hypertension - Plan: Comprehensive metabolic panel  Screening for hyperlipidemia - Plan: Lipid panel  Here today with acute illness.  She has a cough which suggests intermittent bronchospasm without serious respiratory sx.  Would like to use prednisone but her glucose control is uncertain and she does not check her glucose at home.  Will start on an inhaled steroid and also use the albuterol that she has at home Tessalon perles as needed She would like some of her tussionex-  I had rx cheratussin for her lat week but it is not really helping her.  She will use the tussionex OR cheratussin, not both   She plans to come in for fasting labs in the next couple of weeks and will let me know if not feeling better soon  Signed [name], MD

## 2016-07-14 ENCOUNTER — Telehealth: Payer: Self-pay | Admitting: Family Medicine

## 2016-07-14 NOTE — Telephone Encounter (Signed)
Caller name: Relationship to patient: Self Can be reached: [phone]  Pharmacy:  [place], [place] - [address] [phone] (Phone) [phone] (Fax)     Reason for call: Refill losartan-hydrochlorothiazide (HYZAAR) 100-25 MG tablet [id number]

## 2016-07-15 ENCOUNTER — Encounter: Payer: Self-pay | Admitting: Family Medicine

## 2016-07-15 ENCOUNTER — Other Ambulatory Visit: Payer: Self-pay | Admitting: Emergency Medicine

## 2016-07-15 ENCOUNTER — Other Ambulatory Visit (INDEPENDENT_AMBULATORY_CARE_PROVIDER_SITE_OTHER): Payer: BLUE CROSS/BLUE SHIELD

## 2016-07-15 DIAGNOSIS — I10 Essential (primary) hypertension: Secondary | ICD-10-CM | POA: Diagnosis not present

## 2016-07-15 DIAGNOSIS — E119 Type 2 diabetes mellitus without complications: Secondary | ICD-10-CM

## 2016-07-15 DIAGNOSIS — R7989 Other specified abnormal findings of blood chemistry: Secondary | ICD-10-CM

## 2016-07-15 DIAGNOSIS — Z13220 Encounter for screening for lipoid disorders: Secondary | ICD-10-CM

## 2016-07-15 LAB — COMPREHENSIVE METABOLIC PANEL
ALBUMIN: 4.1 g/dL (ref 3.5–5.2)
ALK PHOS: 94 U/L (ref 39–117)
ALT: 13 U/L (ref 0–35)
AST: 19 U/L (ref 0–37)
BUN: 27 mg/dL — ABNORMAL HIGH (ref 6–23)
CALCIUM: 9.8 mg/dL (ref 8.4–10.5)
CO2: 31 mEq/L (ref 19–32)
Chloride: 101 mEq/L (ref 96–112)
Creatinine, Ser: 1.29 mg/dL — ABNORMAL HIGH (ref 0.40–1.20)
GFR: 53.42 mL/min — AB (ref >60.00)
Glucose, Bld: 107 mg/dL — ABNORMAL HIGH (ref 70–99)
POTASSIUM: 3.6 meq/L (ref 3.5–5.1)
Sodium: 141 mEq/L (ref 135–145)
TOTAL PROTEIN: 8.3 g/dL (ref 6.0–8.3)
Total Bilirubin: 0.5 mg/dL (ref 0.2–1.2)

## 2016-07-15 LAB — LIPID PANEL
CHOLESTEROL: 177 mg/dL (ref 0–200)
HDL: 58.9 mg/dL (ref >39.00)
LDL Cholesterol: 102 mg/dL — ABNORMAL HIGH (ref 0–99)
NonHDL: 118.31
TRIGLYCERIDES: 82 mg/dL (ref 0.0–149.0)
Total CHOL/HDL Ratio: 3
VLDL: 16.4 mg/dL (ref 0.0–40.0)

## 2016-07-15 LAB — HEMOGLOBIN A1C: Hgb A1c MFr Bld: 6.4 % (ref 4.6–6.5)

## 2016-07-15 MED ORDER — LOSARTAN POTASSIUM-HCTZ 100-25 MG PO TABS: ORAL_TABLET | ORAL | 1 refills | Status: DC

## 2016-07-15 NOTE — Telephone Encounter (Signed)
Refill has bee sent to pharmacy per pt request.

## 2016-07-15 NOTE — Progress Notes (Signed)
Received her labs and gave her a call.  I am a bit concerned about her creat which is trending up over the last year.  Would like to consider getting her off the HCTZ and maybe add amlodipine. Will also need to DC potassium.  No answer will try her back  BP Readings from Last 3 Encounters:  06/04/16 (!) 145/85  01/15/16 (!) 188/85  11/25/15 (!) 192/80   Lab Results  Component Value Date   HGBA1C 6.4 07/15/2016   Her DM and cholesterol are under good control

## 2016-07-17 ENCOUNTER — Telehealth: Payer: Self-pay | Admitting: Family Medicine

## 2016-07-17 NOTE — Telephone Encounter (Signed)
Caller name: Kasara Relation to pt: self Call back number: [phone] Pharmacy:  [facility] fax [phone], As[place] Muir [phone]  Reason for call:  Pt states saw Dr Lorelei Pont and that was recommended by  PCP ([place]) to have Flu shot and the Pneumonia shot done at the [facility] pharmacy at her convenience, pt states [facility] to [facility] but was asked to have a prescription sent to [facility] for her Pneumonia shot, pt wants to know if possible for today or the latest tomorrow since insurance ends this year. Please advise ASAP.

## 2016-07-17 NOTE — Telephone Encounter (Signed)
She already had her pneumonia shot- she just needs flu. No rx needed. Called pt and LMOM at home number.  Cell- no answer, VM is full

## 2016-07-21 NOTE — Progress Notes (Signed)
Results for orders placed or performed in visit on 07/15/16  Comprehensive metabolic panel  Result Value Ref Range   Sodium 141 135 - 145 mEq/L   Potassium 3.6 3.5 - 5.1 mEq/L   Chloride 101 96 - 112 mEq/L   CO2 31 19 - 32 mEq/L   Glucose, Bld 107 (H) 70 - 99 mg/dL   BUN 27 (H) 6 - 23 mg/dL   Creatinine, Ser 1.29 (H) 0.40 - 1.20 mg/dL   Total Bilirubin 0.5 0.2 - 1.2 mg/dL   Alkaline Phosphatase 94 39 - 117 U/L   AST 19 0 - 37 U/L   ALT 13 0 - 35 U/L   Total Protein 8.3 6.0 - 8.3 g/dL   Albumin 4.1 3.5 - 5.2 g/dL   Calcium 9.8 8.4 - 10.5 mg/dL   GFR 53.42 (L) >60.00 mL/min  Lipid panel  Result Value Ref Range   Cholesterol 177 0 - 200 mg/dL   Triglycerides 82.0 0.0 - 149.0 mg/dL   HDL 58.90 >39.00 mg/dL   VLDL 16.4 0.0 - 40.0 mg/dL   LDL Cholesterol 102 (H) 0 - 99 mg/dL   Total CHOL/HDL Ratio 3    NonHDL 118.31   Hemoglobin A1C  Result Value Ref Range   Hgb A1c MFr Bld 6.4 4.6 - 6.5 %   Recent labs as above. DM controlled with metformin. She is on an ARB Not on cholesterol med Creat is up over the last year- will refer to nephrology.  LMOM for pt with this info, letter to pt GFR is just over 50 so ok to stay on metformin

## 2016-07-22 ENCOUNTER — Telehealth: Payer: Self-pay | Admitting: Family Medicine

## 2016-07-22 DIAGNOSIS — Z5181 Encounter for therapeutic drug level monitoring: Secondary | ICD-10-CM

## 2016-07-22 DIAGNOSIS — R600 Localized edema: Secondary | ICD-10-CM

## 2016-07-22 DIAGNOSIS — N3946 Mixed incontinence: Secondary | ICD-10-CM

## 2016-07-22 NOTE — Telephone Encounter (Signed)
Relation to [id number] Call back [phone]   Reason for call:  Patient returning call regarding lab results, patient is currently at work and would like to speak with PCP at 5pm if possible.

## 2016-07-22 NOTE — Telephone Encounter (Signed)
Called back did not reach

## 2016-07-23 NOTE — Addendum Note (Signed)
Addended by: [name] C on: 07/23/2016 05:00 PM   Modules accepted: Orders

## 2016-07-23 NOTE — Telephone Encounter (Signed)
Pt called and we went over her recent labs. Noted mild elevation of creat over the last year.  She is on hctz and lasix.  She notes that her ankles swell even with lasix- she will try stopping this and we will repeat a BMP in one month.  This will also allow [place] to check her K as she is on a supplement.  Will maintain K supplement for now as she is on hctz. I did place a referral to nephrology but she should be low priority - appt probably months away. Can cancel if her renal function normalizes

## 2016-07-31 ENCOUNTER — Telehealth: Payer: Self-pay | Admitting: Family Medicine

## 2016-07-31 NOTE — Addendum Note (Signed)
Addended by: [name] C on: 07/31/2016 05:50 PM   Modules accepted: Orders

## 2016-07-31 NOTE — Telephone Encounter (Signed)
Pt called in she says that she stop taking the Lasix as advised by provider. Pt says that since then she has had some swelling in her feet/legs. She would like for provider to advise her further.     CB: [phone]

## 2016-07-31 NOTE — Telephone Encounter (Signed)
Completed.

## 2016-07-31 NOTE — Telephone Encounter (Signed)
Called her back- she stopped taking lasix about one week ago and has developed painful swelling in her legs.  She is still on hctz.  She is also taking k 20 daily.  She will go back on the lasix (will try for QOD) and will get plenty of dietary potassium.  Renal appt next week.  I also will arrange a cardiac echo to ensure this is not why she is swelling.  She also notes urinary incontin (which is long standing and she thinks not due totally to diuretics)- she would like to see urology which is fine.  Will arrange this as well

## 2016-08-19 ENCOUNTER — Telehealth: Payer: Self-pay | Admitting: Family Medicine

## 2016-08-19 NOTE — Telephone Encounter (Signed)
Called her- she will see me tomorrow at 5pm for illness.  Put her on the schedule

## 2016-08-19 NOTE — Telephone Encounter (Signed)
Patient called stating that she has again the same cough she had in Nov last year. She would like to get a refill of chlorpheniramine-HYDROcodone (TUSSIONEX PENNKINETIC ER) 10-8 MG/5ML SURE. She states that she has been taking what she has left over from last year but is running very low. Please advise  Pharmacy:  [facility] [place], [place] - [address]

## 2016-08-19 NOTE — Telephone Encounter (Signed)
Appt. Scheduled.

## 2016-08-19 NOTE — Telephone Encounter (Signed)
Patient called inquiring about her referrals to Urology and Cardiology. I provided the patient with the number for [facility], but I do not see a referral for Cardiology. Please advise

## 2016-08-20 ENCOUNTER — Encounter: Payer: Self-pay | Admitting: Family Medicine

## 2016-08-20 ENCOUNTER — Ambulatory Visit (INDEPENDENT_AMBULATORY_CARE_PROVIDER_SITE_OTHER): Payer: BLUE CROSS/BLUE SHIELD | Admitting: Family Medicine

## 2016-08-20 VITALS — BP 140/82 | HR 67 | Temp 98.2°F | Ht 65.5 in | Wt 235.0 lb

## 2016-08-20 DIAGNOSIS — R059 Cough, unspecified: Secondary | ICD-10-CM

## 2016-08-20 DIAGNOSIS — R05 Cough: Secondary | ICD-10-CM | POA: Diagnosis not present

## 2016-08-20 DIAGNOSIS — J029 Acute pharyngitis, unspecified: Secondary | ICD-10-CM

## 2016-08-20 DIAGNOSIS — R6889 Other general symptoms and signs: Secondary | ICD-10-CM

## 2016-08-20 DIAGNOSIS — I517 Cardiomegaly: Secondary | ICD-10-CM | POA: Diagnosis not present

## 2016-08-20 LAB — POCT RAPID STREP A (OFFICE): Rapid Strep A Screen: NEGATIVE

## 2016-08-20 MED ORDER — OSELTAMIVIR PHOSPHATE 75 MG PO CAPS: 75.0000 mg | ORAL_CAPSULE | Freq: Two times a day (BID) | ORAL | 0 refills | Status: DC

## 2016-08-20 MED ORDER — HYDROCOD POLST-CPM POLST ER 10-8 MG/5ML PO SUER: 5.0000 mL | Freq: Two times a day (BID) | ORAL | 0 refills | Status: DC | PRN

## 2016-08-20 MED FILL — HYDROCODONE-CHLORPHEN ER SU: 10-8 | 15 days supply | Qty: 75 | Fill #0

## 2016-08-20 NOTE — Progress Notes (Signed)
Pre visit review using our clinic review tool, if applicable. No additional management support is needed unless otherwise documented below in the visit note. 

## 2016-08-20 NOTE — Patient Instructions (Addendum)
Your strep test is negative You likely have the flu- rest, drink plenty of fluids, and use your cough syrup as needed for cough and pain.  Remember this will make you feel sleepy!  Let me know if you are not feeling better or if you are feeling worse

## 2016-08-20 NOTE — Progress Notes (Signed)
[place] at [facility] [address] [place], [place] [address] [phone]  Date:  08/20/2016   Name:  Emily Wood   DOB:  Dec 17, 1950   MRN:  [id number]  PCP:  [name], MD    Chief Complaint: Cough (c/o prod cough with tan colored mucus, fatigue, sore throat and chest congestion, bodyaches Sx's started this past Monday. )   History of Present Illness:  Emily Wood is a 66 y.o. very pleasant female patient who presents with the following:  Today is Thursday. On Monday she notes beginning of a ST- Tuesday she felt awful with body aches, fatigue, felt "absolutley horrible," productive cough, she has been sleeping a lot the last few days No GI symptoms No rash  She has not noted a fever but has not checked her temp either She has tried the rest of the cheratussin that she had left from last year.   However it is not really working and she would like to have some tussionex instead if she could   She had a CXR that showed cardiomegaly a year ago- she wants to have an echo which we will arrange for her CXR 2/17  IMPRESSION: 1. Mild right base subsegmental atelectasis and or infiltrate.  2. Cardiomegaly.  No pulmonary venous congestion .  She is taking a course of prednisone from another provider now- she is about 1/2 through  Patient Active Problem List   Diagnosis Date Noted  . Meralgia paresthetica of left side 08/27/2015  . Diabetes mellitus type 2, controlled ([place]) 08/02/2015  . Chronic venous insufficiency 06/28/2015  . Essential hypertension 02/15/2015  . Peripheral edema 02/15/2015  . Chronic knee pain 02/15/2015    Past Medical History:  Diagnosis Date  . Diabetes mellitus without complication ([place])   . Hypertension     Past Surgical History:  Procedure Laterality Date  . CHOLECYSTECTOMY    . HERNIA REPAIR    . OTHER SURGICAL HISTORY     scar tissue removal from bowels  . PARTIAL  HYSTERECTOMY     Still has ovaries  . UTERINE FIBROID SURGERY      Social History  Substance Use Topics  . Smoking status: Never Smoker  . Smokeless tobacco: Never Used  . Alcohol use No    No family history on file.  No Known Allergies  Medication list has been reviewed and updated.  Current Outpatient Prescriptions on File Prior to Visit  Medication Sig Dispense Refill  . Albuterol Sulfate (PROAIR RESPICLICK) [id number] (90 Base) MCG/ACT AEPB Inhale 2 puffs into the lungs every 6 (six) hours as needed. 1 each 3  . Alpha-Lipoic Acid 300 MG CAPS Take 2 capsules by mouth daily.    Marland Kitchen aspirin 81 MG tablet Take 81 mg by mouth daily.    . Flunisolide HFA 80 MCG/ACT AERS Take 2 puffs inhaled twice a day 8.9 g 2  . furosemide (LASIX) 40 MG tablet Take 1 tablet (40 mg total) by mouth daily. (Patient taking differently: Take 40 mg by mouth every other day. ) 90 tablet 1  . losartan-hydrochlorothiazide (HYZAAR) 100-25 MG tablet TAKE 1 BY MOUTH DAILY 90 tablet 1  . metFORMIN (GLUCOPHAGE) 500 MG tablet Take 1 tablet (500 mg total) by mouth 2 (two) times daily with a meal. 180 tablet 2  . metoprolol (LOPRESSOR) 50 MG tablet Take 1 tablet (50 mg total) by mouth 2 (two) times daily. 180 tablet 3  . OVER THE  COUNTER MEDICATION Take 1 capsule by mouth daily. ARTHROCEN 300 mg     . potassium chloride SA (K-DUR,KLOR-CON) 20 MEQ tablet Take 1 tablet (20 mEq total) by mouth daily. 90 tablet 3   No current facility-administered medications on file prior to visit.     Review of Systems:  As per HPI- otherwise negative. No CP or SOB No rash  Physical Examination: Vitals:   08/20/16 1715  BP: 140/82  Pulse: 67  Temp: 98.2 F (36.8 C)   Vitals:   08/20/16 1715  Weight: 235 lb (106.6 kg)  Height: 5' 5.5" (1.664 m)   Body mass index is 38.51 kg/m. Ideal Body Weight: Weight in (lb) to have BMI = 25: 152.2  GEN: WDWN, NAD, Non-toxic, A & O x 3, obese, looks well HEENT: Atraumatic, Normocephalic.  Neck supple. No masses, No LAD.  Bilateral TM wnl, oropharynx normal.  PEERL,EOMI.   Ears and Nose: No external deformity. CV: RRR, No M/G/R. No JVD. No thrill. No extra heart sounds. PULM: CTA B, no wheezes, crackles, rhonchi. No retractions. No resp. distress. No accessory muscle use. ABD: S, NT, ND EXTR: No c/c/e NEURO Normal gait.  PSYCH: Normally interactive. Conversant. Not depressed or anxious appearing.  Calm demeanor.   Creat clearance is over 70- ok to take full dose of tamiflu We are out of flu tests  Results for orders placed or performed in visit on 08/20/16  POCT rapid strep A  Result Value Ref Range   Rapid Strep A Screen Negative Negative    Assessment and Plan: Flu-like symptoms - Plan: oseltamivir (TAMIFLU) 75 MG capsule, DISCONTINUED: oseltamivir (TAMIFLU) 75 MG capsule  Sore throat - Plan: POCT rapid strep A  Cough - Plan: chlorpheniramine-HYDROcodone (TUSSIONEX PENNKINETIC ER) 10-8 MG/5ML SUER  Cardiomegaly - Plan: ECHOCARDIOGRAM COMPLETE  Here today with likely influenza.  Will treat with tamiflu and gave her a small supply of tussionex for cough Will arrange echo to eval possible cardiomegaly seen on CXR   Supportive care, she will let me know if not feeling better soon  Signed [name], MD

## 2016-08-24 ENCOUNTER — Telehealth: Payer: Self-pay | Admitting: Family Medicine

## 2016-08-24 ENCOUNTER — Encounter: Payer: Self-pay | Admitting: Family Medicine

## 2016-08-24 NOTE — Telephone Encounter (Signed)
Relation to [id number] Call back number:Fax  [phone]   Reason for call:  Patient requesting return back to work letter reflecting 08/25/16 and would like to letter fax to home/fax [phone]

## 2016-09-01 ENCOUNTER — Telehealth: Payer: Self-pay | Admitting: Family Medicine

## 2016-09-01 NOTE — Telephone Encounter (Signed)
Caller name: Relationship to patient:Self Can be reached: [phone]  Pharmacy:  Reason for call: [employer] needs a Provider Courtesy Review for her US of the Heart. States she has not had it done yet because of insurance problems.  The phone number to call is [phone]

## 2016-09-08 NOTE — Telephone Encounter (Signed)
Patient scheduled for her 09/16/16 @11 :30, lm on vm, awaiting return call

## 2016-09-08 NOTE — Telephone Encounter (Signed)
Great, I let pt know it was approved. We can schedule the test.  Thank you!

## 2016-09-08 NOTE — Telephone Encounter (Signed)
Called [facility] for her- spent 25 minutes on the phone [phone]  Can be done at a [facility] location [place] number [phone]

## 2016-09-08 NOTE — Telephone Encounter (Signed)
Insurance has been approved for here at the [employer], [place] number stays the same

## 2016-09-08 NOTE — Telephone Encounter (Signed)
I will call insurance.

## 2016-09-08 NOTE — Telephone Encounter (Signed)
[facility]? Please advise [facility] does not do Echocardiograms

## 2016-09-11 ENCOUNTER — Telehealth: Payer: Self-pay | Admitting: Family Medicine

## 2016-09-14 ENCOUNTER — Ambulatory Visit
Admission: RE | Admit: 2016-09-14 | Discharge: 2016-09-14 | Disposition: A | Discharge status: Home / Self Care | Payer: BLUE CROSS/BLUE SHIELD | Source: Ambulatory Visit | Attending: Anesthesiology | Admitting: Anesthesiology

## 2016-09-14 ENCOUNTER — Other Ambulatory Visit: Payer: Self-pay | Admitting: Anesthesiology

## 2016-09-14 DIAGNOSIS — M25552 Pain in left hip: Secondary | ICD-10-CM

## 2016-09-14 NOTE — Telephone Encounter (Signed)
error:[id number] ° °

## 2016-09-15 ENCOUNTER — Other Ambulatory Visit: Payer: Self-pay | Admitting: Student

## 2016-09-15 ENCOUNTER — Other Ambulatory Visit: Payer: Self-pay | Admitting: Anesthesiology

## 2016-09-15 DIAGNOSIS — M25552 Pain in left hip: Secondary | ICD-10-CM

## 2016-09-16 ENCOUNTER — Ambulatory Visit (HOSPITAL_BASED_OUTPATIENT_CLINIC_OR_DEPARTMENT_OTHER)
Admission: RE | Admit: 2016-09-16 | Discharge: 2016-09-16 | Disposition: A | Discharge status: Home / Self Care | Payer: BLUE CROSS/BLUE SHIELD | Source: Ambulatory Visit | Attending: Family Medicine | Admitting: Family Medicine

## 2016-09-16 ENCOUNTER — Ambulatory Visit
Admission: RE | Admit: 2016-09-16 | Discharge: 2016-09-16 | Disposition: A | Discharge status: Home / Self Care | Payer: BLUE CROSS/BLUE SHIELD | Source: Ambulatory Visit | Attending: Anesthesiology | Admitting: Anesthesiology

## 2016-09-16 DIAGNOSIS — I340 Nonrheumatic mitral (valve) insufficiency: Secondary | ICD-10-CM | POA: Insufficient documentation

## 2016-09-16 DIAGNOSIS — I501 Left ventricular failure: Secondary | ICD-10-CM | POA: Diagnosis not present

## 2016-09-16 DIAGNOSIS — R9439 Abnormal result of other cardiovascular function study: Secondary | ICD-10-CM | POA: Insufficient documentation

## 2016-09-16 DIAGNOSIS — I517 Cardiomegaly: Secondary | ICD-10-CM | POA: Diagnosis not present

## 2016-09-16 DIAGNOSIS — M25552 Pain in left hip: Secondary | ICD-10-CM

## 2016-09-16 NOTE — Progress Notes (Signed)
  Echocardiogram 2D Echocardiogram has been performed.  Emily Wood 09/16/2016, 11:47 AM

## 2016-09-20 ENCOUNTER — Telehealth: Payer: Self-pay | Admitting: Family Medicine

## 2016-09-20 DIAGNOSIS — I5189 Other ill-defined heart diseases: Secondary | ICD-10-CM

## 2016-09-20 NOTE — Telephone Encounter (Signed)
Called pt to go over her recent echo results- EF is normal, some LVH and diastolic dysfunction.  She would like to see cardiology Will refer her to cardiology to ensure we are doing all we can for her heart health

## 2016-09-21 ENCOUNTER — Telehealth: Payer: Self-pay | Admitting: Family Medicine

## 2016-09-21 ENCOUNTER — Other Ambulatory Visit: Payer: BLUE CROSS/BLUE SHIELD

## 2016-09-21 DIAGNOSIS — N183 Chronic kidney disease, stage 3 unspecified: Secondary | ICD-10-CM | POA: Insufficient documentation

## 2016-09-22 NOTE — Telephone Encounter (Signed)
Called pt to go over her echo.  She would like to go ahead and see cardiology- will arrange for her

## 2016-09-24 ENCOUNTER — Encounter: Payer: Self-pay | Admitting: Cardiology

## 2016-09-29 NOTE — Progress Notes (Signed)
Cardiology Office Note    Date:  10/01/2016   ID:  Emily Wood, DOB Feb 15, 1951, MRN [id number]  PCP:  [name], MD  Cardiologist:  Zakary Kimura [place], MD    History of Present Illness:  Emily Wood is a 66 y.o. female referred by Dr. Edilia Bo for evaluation of diastolic dysfunction. She has a history of DM type 2 and longstanding HTN. She complains of symptoms of wheezing at night. Mild dyspnea. No chest pain. She had a recent CXR done that showed CM. An Echo was ordered that showed moderate LVH with normal systolic function and grade 2 diastolic dysfunction. There was evidence of moderate pulmonary HTN. She has chronic LE swelling for many years related to venous insufficiency. She was on lasix until 2 months ago when it was stopped due to frequent urination. She has been seen by Nephrology- Dr. Mitzi Hansen in [place] and was started on Clonidine about a month ago for HTN. She notes this makes her mouth dry. She has significant DJD and walks with a cane. She is very concerned about her heart and wants a stress test.    Past Medical History:  Diagnosis Date  . Arthritis   . Diabetes mellitus without complication ([place])   . Hypertension     Past Surgical History:  Procedure Laterality Date  . CHOLECYSTECTOMY    . HERNIA REPAIR    . OTHER SURGICAL HISTORY     scar tissue removal from bowels  . PARTIAL HYSTERECTOMY     Still has ovaries  . UTERINE FIBROID SURGERY      Current Medications: Outpatient Medications Prior to Visit  Medication Sig Dispense Refill  . Albuterol Sulfate (PROAIR RESPICLICK) 017 (90 Base) MCG/ACT AEPB Inhale 2 puffs into the lungs every 6 (six) hours as needed. 1 each 3  . Alpha-Lipoic Acid 300 MG CAPS Take 2 capsules by mouth daily.    Marland Kitchen aspirin 81 MG tablet Take 81 mg by mouth daily.    . chlorpheniramine-HYDROcodone (TUSSIONEX PENNKINETIC ER) 10-8 MG/5ML SUER Take 5 mLs by mouth every 12 (twelve) hours as needed for cough. 75 mL 0  .  Flunisolide HFA 80 MCG/ACT AERS Take 2 puffs inhaled twice a day 8.9 g 2  . gabapentin (NEURONTIN) 400 MG capsule Take 2 capsules by mouth 3 (three) times daily.   0  . losartan-hydrochlorothiazide (HYZAAR) 100-25 MG tablet TAKE 1 BY MOUTH DAILY 90 tablet 1  . metFORMIN (GLUCOPHAGE) 500 MG tablet Take 1 tablet (500 mg total) by mouth 2 (two) times daily with a meal. 180 tablet 2  . metoprolol (LOPRESSOR) 50 MG tablet Take 1 tablet (50 mg total) by mouth 2 (two) times daily. 180 tablet 3  . oseltamivir (TAMIFLU) 75 MG capsule Take 1 capsule (75 mg total) by mouth 2 (two) times daily. 10 capsule 0  . OVER THE COUNTER MEDICATION Take 1 capsule by mouth daily. ARTHROCEN 300 mg     . potassium chloride SA (K-DUR,KLOR-CON) 20 MEQ tablet Take 1 tablet (20 mEq total) by mouth daily. 90 tablet 3  . predniSONE (DELTASONE) 10 MG tablet TAKE ONE TABLET BY MOUTH THREE TIMES DAILY FOR 2 DAYS THEN ONE TABLET TWICE DAILY FOR 5 DAYS THEN ONE TABLET DAILY UNTIL FINISHED.  0  . furosemide (LASIX) 40 MG tablet Take 1 tablet (40 mg total) by mouth daily. (Patient taking differently: Take 40 mg by mouth every other day. ) 90 tablet 1   No facility-administered medications prior to visit.  Allergies:   Patient has no known allergies.   Social History   Social History  . Marital status: Married    Spouse name: [name]  . Number of children: 0  . Years of education: 21   Occupational History  . customer service    Social History Main Topics  . Smoking status: Never Smoker  . Smokeless tobacco: Never Used  . Alcohol use No  . Drug use: No  . Sexual activity: Not Asked   Other Topics Concern  . None   Social History Narrative   Lives with husband   Caffeine use: 16oz coffee per day     Family History:  The patient's family history is negative for cardiac disease. Mother died in [place]. Father died of unknown causes.   ROS:   Please see the history of present illness.    ROS All other systems  reviewed and are negative.   PHYSICAL EXAM:   VS:  BP (!) 170/96 (BP Location: Right Arm, Patient Position: Sitting, Cuff Size: Large)   Pulse 65   Ht 5\' 5"  (1.651 m)   Wt 250 lb (113.4 kg)   BMI 41.60 kg/m    GEN: Well nourished, well developed, in no acute distress  HEENT: normal  Neck: no JVD, carotid bruits, or masses Cardiac: RRR; no murmurs, rubs, or gallops,no edema  Respiratory:  clear to auscultation bilaterally, normal work of breathing GI: soft, nontender, nondistended, + BS Skin: warm and dry LE with 2+ edema to the knees. Chronic venous stasis skin changes. Neuro:  Alert and Oriented x 3, Strength and sensation are intact Psych: euthymic mood, full affect  Wt Readings from Last 3 Encounters:  10/01/16 250 lb (113.4 kg)  08/20/16 235 lb (106.6 kg)  06/04/16 232 lb 12.8 oz (105.6 kg)      Studies/Labs Reviewed:   EKG:  EKG is ordered today.  The ekg ordered today demonstrates NSR with nonspecific T wave abnormality. I have personally reviewed and interpreted this study.   Recent Labs: 07/15/2016: ALT 13; BUN 27; Creatinine, Ser 1.29; Potassium 3.6; Sodium 141   Lipid Panel    Component Value Date/Time   CHOL 177 07/15/2016 0718   TRIG 82.0 07/15/2016 0718   HDL 58.90 07/15/2016 0718   CHOLHDL 3 07/15/2016 0718   VLDL 16.4 07/15/2016 0718   LDLCALC 102 (H) 07/15/2016 0718    Additional studies/ records that were reviewed today include:   CHEST  2 VIEW  COMPARISON:  08/26/2015.  FINDINGS: Mediastinum hilar structures normal. Borderline cardiomegaly. No pulmonary venous congestion. Mild right base subsegmental atelectasis and or infiltrate.  IMPRESSION: 1. Mild right base subsegmental atelectasis and or infiltrate.  2. Cardiomegaly.  No pulmonary venous congestion .   Electronically Signed   By: Marcello Moores  Register   On: 09/13/2015 10:10   Echo 09/16/16: Study Conclusions  - Left ventricle: The cavity size was normal. Wall thickness  was   increased in a pattern of moderate LVH. Systolic function was   normal. The estimated ejection fraction was in the range of 60%   to 65%. Wall motion was normal; there were no regional wall   motion abnormalities. Features are consistent with a pseudonormal   left ventricular filling pattern, with concomitant abnormal   relaxation and increased filling pressure (grade 2 diastolic   dysfunction). - Mitral valve: There was mild regurgitation. - Right atrium: The atrium was mildly dilated. - Pulmonary arteries: Systolic pressure was moderately increased.   PA peak pressure:  52 mm Hg (S).  ASSESSMENT:    1. Chronic venous insufficiency   2. Essential hypertension   3. Hypertensive heart disease without heart failure   4. Controlled type 2 diabetes mellitus without complication, without long-term current use of insulin (HCC)      PLAN:  In order of problems listed above:  1. She has chronic venous insufficiency. Recommend conservative measures with sodium restriction, elevation of legs, and compression hose. Rx given for thigh high compression hose. Consider diuretics if swelling worsens but she would like to avoid due to frequent urination 2. She has hypertensive heart disease with diastolic dysfunction. This could be causing her some symptoms with nocturnal wheezing and dyspnea. Stressed importance of good BP control. BP was quite high today but relates to her rushing as she was late for her appointment. Monitor for now. May need to consider alternative to clonidine given dry mouth symptoms. Currently on metoprolol and losartan HCT. ? Hydralazine. 3. Moderate pulmonary HTN noted on Echo but no RV/RA enlargement or right heart failure. Suspect this may be related to diastolic dysfunction or obesity. 4. Given risk factors of DM and HTN as well as diastolic dysfunction we recommend a Lexiscan myoview study to evaluate risk of ischemic heart disease.     Medication Adjustments/Labs  and Tests Ordered: Current medicines are reviewed at length with the patient today.  Concerns regarding medicines are outlined above.  Medication changes, Labs and Tests ordered today are listed in the Patient Instructions below. Patient Instructions  We will schedule you for a nuclear stress test  We will prescribe compression hose to wear.   Restrict your salt intake.  Monitor your BP regularly at home and record.      Signed, Earmon Sherrow [place], MD  10/01/2016 1:22 PM    [facility] [address], [place], [place], [address] [phone]

## 2016-10-01 ENCOUNTER — Ambulatory Visit (INDEPENDENT_AMBULATORY_CARE_PROVIDER_SITE_OTHER): Payer: BLUE CROSS/BLUE SHIELD | Admitting: Cardiology

## 2016-10-01 ENCOUNTER — Telehealth: Payer: Self-pay | Admitting: Family Medicine

## 2016-10-01 ENCOUNTER — Encounter: Payer: Self-pay | Admitting: Cardiology

## 2016-10-01 ENCOUNTER — Other Ambulatory Visit: Payer: Self-pay | Admitting: Emergency Medicine

## 2016-10-01 VITALS — BP 170/96 | HR 65 | Ht 65.0 in | Wt 250.0 lb

## 2016-10-01 DIAGNOSIS — R059 Cough, unspecified: Secondary | ICD-10-CM

## 2016-10-01 DIAGNOSIS — I10 Essential (primary) hypertension: Secondary | ICD-10-CM

## 2016-10-01 DIAGNOSIS — I872 Venous insufficiency (chronic) (peripheral): Secondary | ICD-10-CM | POA: Diagnosis not present

## 2016-10-01 DIAGNOSIS — I119 Hypertensive heart disease without heart failure: Secondary | ICD-10-CM | POA: Diagnosis not present

## 2016-10-01 DIAGNOSIS — J9801 Acute bronchospasm: Secondary | ICD-10-CM

## 2016-10-01 DIAGNOSIS — E119 Type 2 diabetes mellitus without complications: Secondary | ICD-10-CM | POA: Diagnosis not present

## 2016-10-01 DIAGNOSIS — R05 Cough: Secondary | ICD-10-CM

## 2016-10-01 MED ORDER — ALBUTEROL SULFATE 108 (90 BASE) MCG/ACT IN AEPB: 2.0000 | INHALATION_SPRAY | Freq: Four times a day (QID) | RESPIRATORY_TRACT | 3 refills | Status: DC | PRN

## 2016-10-01 NOTE — Telephone Encounter (Signed)
Tried to contact pt to see if she would like to be seen today. No answer, left message for pt to return call.

## 2016-10-01 NOTE — Telephone Encounter (Signed)
Sent in rx for albuterol  Meds ordered this encounter  Medications  . Albuterol Sulfate (PROAIR RESPICLICK) 825 (90 Base) MCG/ACT AEPB    Sig: Inhale 2 puffs into the lungs every 6 (six) hours as needed.    Dispense:  1 each    Refill:  3   Called pt- she is ok, not in any distress.  She needed HFA, not neb solution.

## 2016-10-01 NOTE — Patient Instructions (Signed)
We will schedule you for a nuclear stress test  We will prescribe compression hose to wear.   Restrict your salt intake.  Monitor your BP regularly at home and record.

## 2016-10-01 NOTE — Telephone Encounter (Signed)
°  Relation to [id number] Call back number:[phone] Pharmacy: [facility], [place] - [address] [phone] (Phone) [phone] (Fax)     Reason for call:  Patient requesting refill inhaler/solution patient completely out and started wheezing last night,please advise

## 2016-10-13 ENCOUNTER — Telehealth (HOSPITAL_COMMUNITY): Payer: Self-pay

## 2016-10-13 NOTE — Telephone Encounter (Signed)
Pt to return call if she has any questions or concerns. Encounter complete.

## 2016-10-15 ENCOUNTER — Ambulatory Visit (HOSPITAL_COMMUNITY)
Admission: RE | Admit: 2016-10-15 | Discharge: 2016-10-15 | Disposition: A | Discharge status: Home / Self Care | Payer: BLUE CROSS/BLUE SHIELD | Source: Ambulatory Visit | Attending: Cardiovascular Disease | Admitting: Cardiovascular Disease

## 2016-10-15 DIAGNOSIS — I10 Essential (primary) hypertension: Secondary | ICD-10-CM | POA: Diagnosis not present

## 2016-10-15 DIAGNOSIS — E1122 Type 2 diabetes mellitus with diabetic chronic kidney disease: Secondary | ICD-10-CM | POA: Diagnosis not present

## 2016-10-15 DIAGNOSIS — N183 Chronic kidney disease, stage 3 (moderate): Secondary | ICD-10-CM | POA: Insufficient documentation

## 2016-10-15 DIAGNOSIS — I872 Venous insufficiency (chronic) (peripheral): Secondary | ICD-10-CM | POA: Diagnosis not present

## 2016-10-15 DIAGNOSIS — I1310 Hypertensive heart and chronic kidney disease without heart failure, with stage 1 through stage 4 chronic kidney disease, or unspecified chronic kidney disease: Secondary | ICD-10-CM | POA: Insufficient documentation

## 2016-10-15 DIAGNOSIS — E119 Type 2 diabetes mellitus without complications: Secondary | ICD-10-CM | POA: Diagnosis not present

## 2016-10-15 DIAGNOSIS — I119 Hypertensive heart disease without heart failure: Secondary | ICD-10-CM | POA: Diagnosis not present

## 2016-10-15 MED ORDER — REGADENOSON 0.4 MG/5ML IV SOLN
0.4000 mg | Freq: Once | INTRAVENOUS | Status: AC
  Administered 2016-10-15: 0.4 mg via INTRAVENOUS

## 2016-10-15 MED ORDER — TECHNETIUM TC 99M TETROFOSMIN IV KIT
32.0000 | PACK | Freq: Once | INTRAVENOUS | Status: AC | PRN
  Administered 2016-10-15: 32 via INTRAVENOUS
  Filled 2016-10-15: qty 32

## 2016-10-16 ENCOUNTER — Ambulatory Visit (HOSPITAL_COMMUNITY)
Admission: RE | Admit: 2016-10-16 | Discharge: 2016-10-16 | Disposition: A | Discharge status: Home / Self Care | Payer: BLUE CROSS/BLUE SHIELD | Source: Ambulatory Visit | Attending: Internal Medicine | Admitting: Internal Medicine

## 2016-10-16 LAB — MYOCARDIAL PERFUSION IMAGING
CHL CUP NUCLEAR SDS: 1
CHL CUP RESTING HR STRESS: 60 {beats}/min
LV sys vol: 37 mL
LVDIAVOL: 96 mL (ref 46–106)
Peak HR: 74 {beats}/min
SRS: 2
SSS: 3
TID: 0.97

## 2016-10-16 MED ORDER — TECHNETIUM TC 99M TETROFOSMIN IV KIT
32.4000 | PACK | Freq: Once | INTRAVENOUS | Status: AC | PRN
  Administered 2016-10-16: 32.4 via INTRAVENOUS

## 2016-11-26 ENCOUNTER — Encounter: Payer: Self-pay | Admitting: Family Medicine

## 2016-11-26 ENCOUNTER — Ambulatory Visit (INDEPENDENT_AMBULATORY_CARE_PROVIDER_SITE_OTHER): Payer: BLUE CROSS/BLUE SHIELD | Admitting: Family Medicine

## 2016-11-26 VITALS — BP 154/84 | HR 71 | Temp 98.3°F | Ht 65.0 in | Wt 242.6 lb

## 2016-11-26 DIAGNOSIS — I10 Essential (primary) hypertension: Secondary | ICD-10-CM | POA: Diagnosis not present

## 2016-11-26 DIAGNOSIS — R059 Cough, unspecified: Secondary | ICD-10-CM

## 2016-11-26 DIAGNOSIS — R600 Localized edema: Secondary | ICD-10-CM

## 2016-11-26 DIAGNOSIS — E119 Type 2 diabetes mellitus without complications: Secondary | ICD-10-CM | POA: Diagnosis not present

## 2016-11-26 DIAGNOSIS — N183 Chronic kidney disease, stage 3 unspecified: Secondary | ICD-10-CM

## 2016-11-26 DIAGNOSIS — R05 Cough: Secondary | ICD-10-CM

## 2016-11-26 DIAGNOSIS — Z130 Encounter for screening for diseases of the blood and blood-forming organs and certain disorders involving the immune mechanism: Secondary | ICD-10-CM | POA: Diagnosis not present

## 2016-11-26 DIAGNOSIS — Z1159 Encounter for screening for other viral diseases: Secondary | ICD-10-CM

## 2016-11-26 LAB — BASIC METABOLIC PANEL
BUN: 24 mg/dL — ABNORMAL HIGH (ref 6–23)
CALCIUM: 9.8 mg/dL (ref 8.4–10.5)
CO2: 28 mEq/L (ref 19–32)
Chloride: 102 mEq/L (ref 96–112)
Creatinine, Ser: 1.12 mg/dL (ref 0.40–1.20)
GFR: 62.81 mL/min (ref >60.00)
Glucose, Bld: 125 mg/dL — ABNORMAL HIGH (ref 70–99)
POTASSIUM: 3.4 meq/L — AB (ref 3.5–5.1)
SODIUM: 138 meq/L (ref 135–145)

## 2016-11-26 LAB — CBC
HCT: 31.1 % — ABNORMAL LOW (ref 36.0–46.0)
Hemoglobin: 10.4 g/dL — ABNORMAL LOW (ref 12.0–15.0)
MCHC: 33.4 g/dL (ref 30.0–36.0)
MCV: 84.2 fl (ref 78.0–100.0)
PLATELETS: 396 10*3/uL (ref 150.0–400.0)
RBC: 3.69 Mil/uL — AB (ref 3.87–5.11)
RDW: 12.7 % (ref 11.5–15.5)
WBC: 7.5 10*3/uL (ref 4.0–10.5)

## 2016-11-26 LAB — HEMOGLOBIN A1C: Hgb A1c MFr Bld: 7.2 % — ABNORMAL HIGH (ref 4.6–6.5)

## 2016-11-26 LAB — HEPATITIS C ANTIBODY: HCV AB: NEGATIVE

## 2016-11-26 MED ORDER — BECLOMETHASONE DIPROPIONATE 40 MCG/ACT IN AERS: 2.0000 | INHALATION_SPRAY | Freq: Two times a day (BID) | RESPIRATORY_TRACT | 6 refills | Status: DC

## 2016-11-26 NOTE — Patient Instructions (Signed)
Please have blood drawn today and I will be in touch with your labs asap We are going to add an inhaled steroid- qvar- to your inhaler regimen. I hope that this will help with your cough.  Continue to use the pro-air as needed  Please come and see me in one month for a recheck and bring ALL of your medications with you.

## 2016-11-26 NOTE — Progress Notes (Signed)
[place] at [facility] [address] [place], [place] [address] [phone]  Date:  11/26/2016   Name:  Emily Wood   DOB:  Aug 23, 1950   MRN:  [id number]  PCP:  Darreld Mclean, MD    Chief Complaint: Cough (c/o dry cough x 3 weeks. Pt states that talking seems to trigger coughing. )   History of Present Illness:  Emily Wood is a 66 y.o. very pleasant female patient who presents with the following:  History of CKD, DM, HTN Here today for a sick visit Last seen by myself in February when she was not feeling well We did an echo due to concern of cardiomegaly on CXR- showed mild LVH and diastolic dysfunction  She did get back to normal since her visit in February.  However over the last couple of months she has noted cough and wheezing.   She had called in March and requested an inhaler-this is when her cough began She has noted a cough that waxes and wanes since then- it can be worse when she talks and also worse at night  Her inhaler does help with the cough. She thinks she is using albuterol- she is using her inhaler about twice a day.  I did give her an inhaled steroid last year but she is not sure if she ever filled it or if she tried using it.   She may have wheezing at night only The cough is dry She does not have sneezing, itchy eyes or runny nose No childhood asthma sx  No fever or chills  She is treated for HTN and associated CKD-  She sees nephrology but I do have their notes; they changed her BP medication recently but she is not sure what med they changed her to or what she is currently taking for her BP.   She will see nephrology for follow-up in about 2 weeks and will discuss her BP with them at taht time    Lab Results  Component Value Date   HGBA1C 6.4 07/15/2016   BP Readings from Last 3 Encounters:  11/26/16 (!) 170/90  10/01/16 (!) 170/96  08/20/16 140/82     Patient Active Problem  List   Diagnosis Date Noted  . Hypertensive heart disease without heart failure 10/01/2016  . Chronic kidney disease, stage 3 09/21/2016  . Meralgia paresthetica of left side 08/27/2015  . Diabetes mellitus type 2, controlled ([place]) 08/02/2015  . Chronic venous insufficiency 06/28/2015  . Essential hypertension 02/15/2015  . Peripheral edema 02/15/2015  . Chronic knee pain 02/15/2015    Past Medical History:  Diagnosis Date  . Arthritis   . Diabetes mellitus without complication ([place])   . Hypertension     Past Surgical History:  Procedure Laterality Date  . CHOLECYSTECTOMY    . HERNIA REPAIR    . OTHER SURGICAL HISTORY     scar tissue removal from bowels  . PARTIAL HYSTERECTOMY     Still has ovaries  . UTERINE FIBROID SURGERY      Social History  Substance Use Topics  . Smoking status: Never Smoker  . Smokeless tobacco: Never Used  . Alcohol use No    No family history on file.  No Known Allergies  Medication list has been reviewed and updated.  Current Outpatient Prescriptions on File Prior to Visit  Medication Sig Dispense Refill  . Albuterol Sulfate (PROAIR RESPICLICK) 300 (90 Base) MCG/ACT AEPB Inhale 2  puffs into the lungs every 6 (six) hours as needed. 1 each 3  . Alpha-Lipoic Acid 300 MG CAPS Take 2 capsules by mouth daily.    Marland Kitchen aspirin 81 MG tablet Take 81 mg by mouth daily.    . cloNIDine (CATAPRES) 0.1 MG tablet Take 0.1 mg by mouth 3 (three) times daily.    . Flunisolide HFA 80 MCG/ACT AERS Take 2 puffs inhaled twice a day 8.9 g 2  . gabapentin (NEURONTIN) 400 MG capsule Take 2 capsules by mouth 3 (three) times daily.   0  . losartan-hydrochlorothiazide (HYZAAR) 100-25 MG tablet TAKE 1 BY MOUTH DAILY 90 tablet 1  . metFORMIN (GLUCOPHAGE) 500 MG tablet Take 1 tablet (500 mg total) by mouth 2 (two) times daily with a meal. 180 tablet 2  . metoprolol (LOPRESSOR) 50 MG tablet Take 1 tablet (50 mg total) by mouth 2 (two) times daily. 180 tablet 3  . OVER THE  COUNTER MEDICATION Take 1 capsule by mouth daily. ARTHROCEN 300 mg     . potassium chloride SA (K-DUR,KLOR-CON) 20 MEQ tablet Take 1 tablet (20 mEq total) by mouth daily. 90 tablet 3   No current facility-administered medications on file prior to visit.     Review of Systems:  As per HPI- otherwise negative.   Physical Examination: Vitals:   11/26/16 0927 11/26/16 0932  BP: (!) 166/90 (!) 170/90  Pulse: 71   Temp: 98.3 F (36.8 C)    Vitals:   11/26/16 0927  Weight: 242 lb 9.6 oz (110 kg)  Height: 5\' 5"  (1.651 m)   Body mass index is 40.37 kg/m. Ideal Body Weight: Weight in (lb) to have BMI = 25: 149.9  GEN: WDWN, NAD, Non-toxic, A & O x 3, obese, otherwise looks well HEENT: Atraumatic, Normocephalic. Neck supple. No masses, No LAD.  Bilateral TM wnl, oropharynx normal.  PEERL,EOMI.   Ears and Nose: No external deformity. CV: RRR, No M/G/R. No JVD. No thrill. No extra heart sounds. PULM: CTA B, no wheezes, crackles, rhonchi. No retractions. No resp. distress. No accessory muscle use. ABD: S, NT, ND EXTR: No c/c. 1+ edema of both LE NEURO Normal gait.  PSYCH: Normally interactive. Conversant. Not depressed or anxious appearing.  Calm demeanor.    Assessment and Plan: Cough - Plan: beclomethasone (QVAR) 40 MCG/ACT inhaler  Encounter for hepatitis C screening test for low risk patient - Plan: Hepatitis C antibody  Chronic kidney disease, stage 3 - Plan: Basic metabolic panel  Essential hypertension  Controlled type 2 diabetes mellitus without complication, without long-term current use of insulin (HCC) - Plan: Hemoglobin A1c  Pedal edema  Screening for deficiency anemia - Plan: CBC  Here today to follow-up on a cough and associated wheezing that she has noted for 2 months.  Denies any history of asthma.  She is not using any allergy meds.  Will add an inhaled steroid to her regimen- qvar.  She is asked to let me know if she is not able to afford this  medication Will also obtain other labs as above Her BP is high, (not dangerously so) but I really do not know what she is taking for her HTN right now so cannot adjust meds.   She is seeing her nephrologist in 2 weeks Also asked her to see me in one month and to bring all her meds- she agrees to do so  Results for orders placed or performed in visit on 11/26/16  Hemoglobin A1c  Result Value  Ref Range   Hgb A1c MFr Bld 7.2 (H) 4.6 - 6.5 %  Hepatitis C antibody  Result Value Ref Range   HCV Ab NEGATIVE NEGATIVE  Basic metabolic panel  Result Value Ref Range   Sodium 138 135 - 145 mEq/L   Potassium 3.4 (L) 3.5 - 5.1 mEq/L   Chloride 102 96 - 112 mEq/L   CO2 28 19 - 32 mEq/L   Glucose, Bld 125 (H) 70 - 99 mg/dL   BUN 24 (H) 6 - 23 mg/dL   Creatinine, Ser 1.12 0.40 - 1.20 mg/dL   Calcium 9.8 8.4 - 10.5 mg/dL   GFR 62.81 >60.00 mL/min  CBC  Result Value Ref Range   WBC 7.5 4.0 - 10.5 K/uL   RBC 3.69 (L) 3.87 - 5.11 Mil/uL   Platelets 396.0 150.0 - 400.0 K/uL   Hemoglobin 10.4 (L) 12.0 - 15.0 g/dL   HCT 31.1 (L) 36.0 - 46.0 %   MCV 84.2 78.0 - 100.0 fl   MCHC 33.4 30.0 - 36.0 g/dL   RDW 12.7 11.5 - 15.5 %     Signed [name], MD

## 2016-11-30 ENCOUNTER — Encounter (INDEPENDENT_AMBULATORY_CARE_PROVIDER_SITE_OTHER): Payer: Self-pay | Admitting: Vascular Surgery

## 2016-11-30 ENCOUNTER — Ambulatory Visit (INDEPENDENT_AMBULATORY_CARE_PROVIDER_SITE_OTHER): Payer: BLUE CROSS/BLUE SHIELD | Admitting: Vascular Surgery

## 2016-11-30 VITALS — BP 217/89 | HR 72 | Resp 17 | Ht 65.5 in | Wt 238.0 lb

## 2016-11-30 DIAGNOSIS — N183 Chronic kidney disease, stage 3 unspecified: Secondary | ICD-10-CM

## 2016-11-30 DIAGNOSIS — R600 Localized edema: Secondary | ICD-10-CM

## 2016-11-30 DIAGNOSIS — R609 Edema, unspecified: Secondary | ICD-10-CM

## 2016-11-30 DIAGNOSIS — E119 Type 2 diabetes mellitus without complications: Secondary | ICD-10-CM | POA: Diagnosis not present

## 2016-11-30 NOTE — Progress Notes (Signed)
Subjective:    Patient ID: Emily Wood, female    DOB: [birth year], 66 y.o.   MRN: [id number] Chief Complaint  Patient presents with  . New Evaluation    Lymphedema   Presents as a new patient referred by Dr. Candiss Norse for bilateral lower extremity swelling. The patient endorses a long standing history of bilateral lower extremity swelling for "years". States the "swelling" and "darkness" having worsening prompting her to seek medical attention. She is very uncomfortable wearing skirts and going to the beach due to the look of her legs. She reports a undergoing a venous duplex in [place]. As per Epic - 06/2015 venous duplex was completed by Dr. Bridgett Larsson however no reports are available. She states her veins were "open". Edema is worse at the end of the day. Swelling is associated with pain which is interfering with her ability to function on a daily basis. Patient has "bad knees". Patient with CKD (Stage 3). Denies any trauma or surgery to her lower extremity. Denies any fever, nausea or vomiting.    Review of Systems  Constitutional: Negative.   HENT: Negative.   Eyes: Negative.   Respiratory: Negative.   Cardiovascular: Positive for leg swelling.  Gastrointestinal: Negative.   Endocrine: Negative.   Genitourinary: Negative.   Musculoskeletal: Negative.   Skin: Negative.   Allergic/Immunologic: Negative.   Neurological: Negative.   Hematological: Negative.   Psychiatric/Behavioral: Negative.       Objective:   Physical Exam  Constitutional: She is oriented to person, place, and time. She appears well-developed and well-nourished. No distress.  HENT:  Head: Normocephalic and atraumatic.  Eyes: Conjunctivae are normal. Pupils are equal, round, and reactive to light.  Neck: Normal range of motion.  Cardiovascular: Normal rate, regular rhythm and normal heart sounds.   Pulses:      Radial pulses are 2+ on the right side, and 2+ on the left side.  Hard to appreciate bilateral  pedal pulses due to body habitus and edema  Pulmonary/Chest: Effort normal.  Musculoskeletal: Normal range of motion. She exhibits edema (Moderate 1+ Pitting Edema Bilaterally).  Neurological: She is alert and oriented to person, place, and time.  Skin: She is not diaphoretic.  Severe Stasis Dermatitis Noted Bilaterally. Dermal thickening noted bilaterally as well.   Psychiatric: She has a normal mood and affect. Her behavior is normal. Judgment and thought content normal.  Vitals reviewed.  BP (!) 217/89 (BP Location: Right Arm)   Pulse 72   Resp 17   Ht 5' 5.5" (1.664 m)   Wt 238 lb (108 kg)   BMI 39.00 kg/m   Past Medical History:  Diagnosis Date  . Arthritis   . Diabetes mellitus without complication ([place])   . Hypertension    Social History   Social History  . Marital status: Married    Spouse name: [name]  . Number of children: 0  . Years of education: 15   Occupational History  . customer service    Social History Main Topics  . Smoking status: Never Smoker  . Smokeless tobacco: Never Used  . Alcohol use No  . Drug use: No  . Sexual activity: Not on file   Other Topics Concern  . Not on file   Social History Narrative   Lives with husband   Caffeine use: 16oz coffee per day   Past Surgical History:  Procedure Laterality Date  . CHOLECYSTECTOMY    . HERNIA REPAIR    . OTHER SURGICAL HISTORY  scar tissue removal from bowels  . PARTIAL HYSTERECTOMY     Still has ovaries  . UTERINE FIBROID SURGERY     No family history on file.  No Known Allergies     Assessment & Plan:  Presents as a new patient referred by Dr. Candiss Norse for bilateral lower extremity swelling. The patient endorses a long standing history of bilateral lower extremity swelling for "years". States the "swelling" and "darkness" having worsening prompting her to seek medical attention. She is very uncomfortable wearing skirts and going to the beach due to the look of her legs. She reports  a undergoing a venous duplex in [place]. As per Epic - 06/2015 venous duplex was completed by Dr. Bridgett Larsson however no reports are available. She states her veins were "open". Edema is worse at the end of the day. Swelling is associated with pain which is interfering with her ability to function on a daily basis. Patient has "bad knees". Patient with CKD (Stage 3). Denies any trauma or surgery to her lower extremity. Denies any fever, nausea or vomiting.   1. Peripheral edema - New The patient was encouraged to wear graduated compression stockings (20-30 mmHg) on a daily basis. The patient was instructed to begin wearing the stockings first thing in the morning and removing them in the evening. The patient was instructed specifically not to sleep in the stockings. Prescription given. In addition, behavioral modification including elevation during the day will be initiated. Anti-inflammatories for pain. Will bring patient back for bilateral venous duplex to rule out reflux. Will trial conservative therapy. Information on compression stockings was given to the patient. The patient was instructed to call the office in the interim if any worsening edema or ulcerations to the legs, feet or toes occurs. The patient expresses their understanding.  - VAS [place] LOWER EXTREMITY VENOUS REFLUX; Future  2. Controlled type 2 diabetes mellitus without complication, without long-term current use of insulin (HCC) - Stable Encouraged good control as its slows the progression of atherosclerotic disease  3. Chronic kidney disease, stage 3 - Stable Contributing factor to lower extremity edema  Current Outpatient Prescriptions on File Prior to Visit  Medication Sig Dispense Refill  . Albuterol Sulfate (PROAIR RESPICLICK) 361 (90 Base) MCG/ACT AEPB Inhale 2 puffs into the lungs every 6 (six) hours as needed. 1 each 3  . Alpha-Lipoic Acid 300 MG CAPS Take 2 capsules by mouth daily.    Marland Kitchen aspirin 81 MG tablet Take 81 mg by  mouth daily.    . beclomethasone (QVAR) 40 MCG/ACT inhaler Inhale 2 puffs into the lungs 2 (two) times daily. 1 Inhaler 6  . cloNIDine (CATAPRES) 0.1 MG tablet Take 0.1 mg by mouth 3 (three) times daily.    Marland Kitchen gabapentin (NEURONTIN) 400 MG capsule Take 2 capsules by mouth 3 (three) times daily.   0  . losartan-hydrochlorothiazide (HYZAAR) 100-25 MG tablet TAKE 1 BY MOUTH DAILY 90 tablet 1  . metFORMIN (GLUCOPHAGE) 500 MG tablet Take 1 tablet (500 mg total) by mouth 2 (two) times daily with a meal. 180 tablet 2  . metoprolol (LOPRESSOR) 50 MG tablet Take 1 tablet (50 mg total) by mouth 2 (two) times daily. 180 tablet 3  . OVER THE COUNTER MEDICATION Take 1 capsule by mouth daily. ARTHROCEN 300 mg     . potassium chloride SA (K-DUR,KLOR-CON) 20 MEQ tablet Take 1 tablet (20 mEq total) by mouth daily. 90 tablet 3   No current facility-administered medications on file prior to visit.  There are no Patient Instructions on file for this visit. No Follow-up on file.  Kerith Sherley A Kyliee Ortego, PA-C

## 2016-12-07 ENCOUNTER — Telehealth: Payer: Self-pay | Admitting: Family Medicine

## 2016-12-07 DIAGNOSIS — R059 Cough, unspecified: Secondary | ICD-10-CM

## 2016-12-07 DIAGNOSIS — R05 Cough: Secondary | ICD-10-CM

## 2016-12-07 MED ORDER — HYDROCODONE-HOMATROPINE 5-1.5 MG/5ML PO SYRP: 5.0000 mL | ORAL_SOLUTION | Freq: Three times a day (TID) | ORAL | 0 refills | Status: DC | PRN

## 2016-12-07 NOTE — Telephone Encounter (Signed)
Caller name: Josette Relation to pt: self Call back number: [phone] can leave message pt is at work (can't answer her phone) Pharmacy: [facility], [place] - [id number]  Reason for call: Pt states was seen in our office on 11-26-2016 for coughing issues and pt is stating still not feeling well, during her visit pt was prescribed an inhaler but pt says it is not working, pt would like to know if possible a coughing syrup can be prescribe for her since she had this issue before and coughing syrup has helped out before. Please advise.

## 2016-12-07 NOTE — Telephone Encounter (Signed)
Called and LMOM- I presume she wants something like hycodan? I will rx this for her but she will need to pick it up- will leave at desk for her.   If she wants something different instead please let me know And please alert me if not feeling better soon!

## 2016-12-16 ENCOUNTER — Ambulatory Visit (INDEPENDENT_AMBULATORY_CARE_PROVIDER_SITE_OTHER): Payer: BLUE CROSS/BLUE SHIELD | Admitting: Family Medicine

## 2016-12-16 ENCOUNTER — Ambulatory Visit (HOSPITAL_BASED_OUTPATIENT_CLINIC_OR_DEPARTMENT_OTHER)
Admission: RE | Admit: 2016-12-16 | Discharge: 2016-12-16 | Disposition: A | Discharge status: Home / Self Care | Payer: BLUE CROSS/BLUE SHIELD | Source: Ambulatory Visit | Attending: Family Medicine | Admitting: Family Medicine

## 2016-12-16 VITALS — BP 170/80 | HR 83 | Temp 98.3°F | Ht 65.5 in | Wt 240.6 lb

## 2016-12-16 DIAGNOSIS — R05 Cough: Secondary | ICD-10-CM

## 2016-12-16 DIAGNOSIS — R053 Chronic cough: Secondary | ICD-10-CM

## 2016-12-16 DIAGNOSIS — Z1211 Encounter for screening for malignant neoplasm of colon: Secondary | ICD-10-CM | POA: Diagnosis not present

## 2016-12-16 DIAGNOSIS — I10 Essential (primary) hypertension: Secondary | ICD-10-CM | POA: Diagnosis not present

## 2016-12-16 DIAGNOSIS — J410 Simple chronic bronchitis: Secondary | ICD-10-CM

## 2016-12-16 DIAGNOSIS — R918 Other nonspecific abnormal finding of lung field: Secondary | ICD-10-CM | POA: Insufficient documentation

## 2016-12-16 MED ORDER — MONTELUKAST SODIUM 10 MG PO TABS: 10.0000 mg | ORAL_TABLET | Freq: Every day | ORAL | 3 refills | Status: DC

## 2016-12-16 MED ORDER — PREDNISONE 20 MG PO TABS: ORAL_TABLET | ORAL | 0 refills | Status: DC

## 2016-12-16 NOTE — Patient Instructions (Signed)
We will check a TB test today, and I am going to refer you to see a pulmonologist. They will help [place] determine why you appear to have chronic bronchitis  Take 1 prednisone daily for one week.  We will also have you try adding singulair to your regimen Prednisone will make your blood sugars higher temporarily- let me know if you have any concerns about this!

## 2016-12-16 NOTE — Progress Notes (Addendum)
[name] at [facility] [address], [place], [place] [address] [phone]  Date:  12/16/2016   Name:  Emily Wood   DOB:  03/08/1951   MRN:  [id number]  PCP:  Darreld Mclean, MD    Chief Complaint: Cough (c/o still coughing with occ brown mucus. )   History of Present Illness:  Emily Wood is a 66 y.o. very pleasant female patient who presents with the following:  Seen here on 5/10 with a cough- we added an inhaled steroid (qvar) to her albuterol HPI from that visit:  History of CKD, DM, HTN Here today for a sick visit Last seen by myself in February when she was not feeling well We did an echo due to concern of cardiomegaly on CXR- showed mild LVH and diastolic dysfunction She did get back to normal since her visit in February.  However over the last couple of months she has noted cough and wheezing.   She had called in March and requested an inhaler-this is when her cough began She has noted a cough that waxes and wanes since then- it can be worse when she talks and also worse at night Her inhaler does help with the cough. She thinks she is using albuterol- she is using her inhaler about twice a day.  I did give her an inhaled steroid last year but she is not sure if she ever filled it or if she tried using it.   She may have wheezing at night only The cough is dry She does not have sneezing, itchy eyes or runny nose No childhood asthma sx  She is using qvar- however she does not really think that this helped her She will occasionally bring up some phlegm- not all the time however She is coughing so much that she may get a HA and her chest hurts from cough  She has been coughing since February  She has not had a CXR over the last several month No known exposure to TB Her cough is worse at night No fever noted  BP Readings from Last 3 Encounters:  12/16/16 (!) 217/85  11/30/16 (!) 217/89  11/26/16 (!)  154/84   Lab Results  Component Value Date   HGBA1C 7.2 (H) 11/26/2016   She did see her nephrologist since our last visit- they changed her losartan hct to valsartan/hctz 320/25 once daily She is on metoprolol 50 BID, Clondine 0.1 TID Kcl 20 once a day Metformin 500 BID- see A1c above She is also on ibuprofen and famotidine per her ortho for pain- she uses this just prn Gabapentin 800 TID  Prior to this year, she did not have a long of lung issues or cough Never a smoker No significant second hand smoke exposure She has never been exposed to a dusty work environment that she can recall   Last colonscopy was over 10 years ago- will refer to GI for repeat- noted anemia on last labs.  She does not think that her insurance covers Cologuard  Patient Active Problem List   Diagnosis Date Noted  . Hypertensive heart disease without heart failure 10/01/2016  . Chronic kidney disease, stage 3 09/21/2016  . Meralgia paresthetica of left side 08/27/2015  . Diabetes mellitus type 2, controlled ([place]) 08/02/2015  . Chronic venous insufficiency 06/28/2015  . Essential hypertension 02/15/2015  . Peripheral edema 02/15/2015  . Chronic knee pain 02/15/2015    Past Medical History:  Diagnosis Date  . Arthritis   . Diabetes mellitus without complication ([place])   . Hypertension     Past Surgical History:  Procedure Laterality Date  . CHOLECYSTECTOMY    . HERNIA REPAIR    . OTHER SURGICAL HISTORY     scar tissue removal from bowels  . PARTIAL HYSTERECTOMY     Still has ovaries  . UTERINE FIBROID SURGERY      Social History  Substance Use Topics  . Smoking status: Never Smoker  . Smokeless tobacco: Never Used  . Alcohol use No    No family history on file.  No Known Allergies  Medication list has been reviewed and updated.  Current Outpatient Prescriptions on File Prior to Visit  Medication Sig Dispense Refill  . Albuterol Sulfate (PROAIR RESPICLICK) 644 (90 Base) MCG/ACT  AEPB Inhale 2 puffs into the lungs every 6 (six) hours as needed. 1 each 3  . Alpha-Lipoic Acid 300 MG CAPS Take 2 capsules by mouth daily.    Marland Kitchen aspirin 81 MG tablet Take 81 mg by mouth daily.    . beclomethasone (QVAR) 40 MCG/ACT inhaler Inhale 2 puffs into the lungs 2 (two) times daily. 1 Inhaler 6  . cloNIDine (CATAPRES) 0.1 MG tablet Take 0.1 mg by mouth 3 (three) times daily.    . furosemide (LASIX) 40 MG tablet furosemide 40 mg tablet  Take 1 tablet every day by oral route.    . gabapentin (NEURONTIN) 400 MG capsule Take 2 capsules by mouth 3 (three) times daily.   0  . HYDROcodone-homatropine (HYCODAN) 5-1.5 MG/5ML syrup Take 5 mLs by mouth every 8 (eight) hours as needed for cough. 90 mL 0  . losartan-hydrochlorothiazide (HYZAAR) 100-25 MG tablet TAKE 1 BY MOUTH DAILY 90 tablet 1  . metFORMIN (GLUCOPHAGE) 500 MG tablet Take 1 tablet (500 mg total) by mouth 2 (two) times daily with a meal. 180 tablet 2  . metoprolol (LOPRESSOR) 50 MG tablet Take 1 tablet (50 mg total) by mouth 2 (two) times daily. 180 tablet 3  . OVER THE COUNTER MEDICATION Take 1 capsule by mouth daily. ARTHROCEN 300 mg     . potassium chloride SA (K-DUR,KLOR-CON) 20 MEQ tablet Take 1 tablet (20 mEq total) by mouth daily. 90 tablet 3  . valsartan-hydrochlorothiazide (DIOVAN-HCT) 320-25 MG tablet   0   No current facility-administered medications on file prior to visit.     Review of Systems:  As per HPI- otherwise negative.   Physical Examination: Vitals:   12/16/16 1400  BP: (!) 194/104  Pulse: 83  Temp: 98.3 F (36.8 C)   Vitals:   12/16/16 1400  Weight: 240 lb 9.6 oz (109.1 kg)  Height: 5' 5.5" (1.664 m)   Body mass index is 39.43 kg/m. Ideal Body Weight: Weight in (lb) to have BMI = 25: 152.2  GEN: WDWN, NAD, Non-toxic, A & O x 3, obese, frequent coughing in room HEENT: Atraumatic, Normocephalic. Neck supple. No masses, No LAD. Ears and Nose: No external deformity. CV: RRR, No M/G/R. No JVD. No  thrill. No extra heart sounds. PULM: CTA B, no wheezes, crackles, rhonchi. No retractions. No resp. distress. No accessory muscle use. Lungs sound clear ABD: S, NT, ND, +BS. No rebound. No HSM. EXTR: No c/c/e NEURO Normal gait.  PSYCH: Normally interactive. Conversant. Not depressed or anxious appearing.  Calm demeanor.   Dg Chest 2 View  Result Date: 12/16/2016 CLINICAL DATA:  Cough since February with dyspnea and fatigue EXAM: CHEST  2 VIEW COMPARISON:  09/13/2015 FINDINGS: Borderline cardiomegaly. Mild uncoiling of the thoracic aorta without aneurysm. Mild interstitial prominence is seen bilaterally which may reflect chronic bronchitic change. No pneumonic consolidation, effusion or pneumothorax. No acute nor suspicious osseous abnormalities. Degenerative changes are seen along the dorsal spine. Cholecystectomy clips are seen in the upper abdomen. IMPRESSION: Mild diffuse pulmonary interstitial prominence bilaterally which may reflect chronic bronchitic change. No pneumonic consolidation. Electronically Signed   By: Ashley Royalty M.D.   On: 12/16/2016 14:45   Assessment and Plan: Simple chronic bronchitis ([place]) - Plan: montelukast (SINGULAIR) 10 MG tablet, predniSONE (DELTASONE) 20 MG tablet, Ambulatory referral to Pulmonology  Persistent cough - Plan: Quantiferon tb gold assay, DG Chest 2 View  Uncontrolled hypertension - Plan: Basic metabolic panel  Colon cancer screening - Plan: Ambulatory referral to Gastroenterology  Here with cough off an on for the last 4 months.  CXR today shows possible chronic bronchitis.  She is a never smoker Will refer to pulmonology for further evaluation In the meantime trial of prednisone 20 for one week- advised that this may upset her blood sugars Also add singulair once a day quantiferon gold although TB is unlikely   Signed [name], MD  Results for orders placed or performed in visit on 12/16/16  Quantiferon tb gold assay  Result Value Ref  Range   Interferon Gamma Release Assay NEGATIVE NEGATIVE   Quantiferon Nil Value 0.04 IU/mL   Mitogen-Nil 6.99 IU/mL   Quantiferon Tb Ag Minus Nil Value 0.05 IU/mL  Basic metabolic panel  Result Value Ref Range   Sodium 136 135 - 145 mEq/L   Potassium 4.2 3.5 - 5.1 mEq/L   Chloride 101 96 - 112 mEq/L   CO2 28 19 - 32 mEq/L   Glucose, Bld 77 70 - 99 mg/dL   BUN 19 6 - 23 mg/dL   Creatinine, Ser 1.02 0.40 - 1.20 mg/dL   Calcium 9.6 8.4 - 10.5 mg/dL   GFR 69.96 >60.00 mL/min

## 2016-12-17 LAB — BASIC METABOLIC PANEL
BUN: 19 mg/dL (ref 6–23)
CHLORIDE: 101 meq/L (ref 96–112)
CO2: 28 meq/L (ref 19–32)
Calcium: 9.6 mg/dL (ref 8.4–10.5)
Creatinine, Ser: 1.02 mg/dL (ref 0.40–1.20)
GFR: 69.96 mL/min (ref >60.00)
GLUCOSE: 77 mg/dL (ref 70–99)
Potassium: 4.2 mEq/L (ref 3.5–5.1)
SODIUM: 136 meq/L (ref 135–145)

## 2016-12-18 LAB — QUANTIFERON TB GOLD ASSAY (BLOOD)
INTERFERON GAMMA RELEASE ASSAY: NEGATIVE
Mitogen-Nil: 6.99 IU/mL
QUANTIFERON NIL VALUE: 0.04 [IU]/mL
QUANTIFERON TB AG MINUS NIL: 0.05 [IU]/mL

## 2016-12-22 ENCOUNTER — Ambulatory Visit (INDEPENDENT_AMBULATORY_CARE_PROVIDER_SITE_OTHER): Payer: BLUE CROSS/BLUE SHIELD | Admitting: Pulmonary Disease

## 2016-12-22 ENCOUNTER — Encounter (INDEPENDENT_AMBULATORY_CARE_PROVIDER_SITE_OTHER): Payer: Self-pay

## 2016-12-22 ENCOUNTER — Encounter: Payer: Self-pay | Admitting: Pulmonary Disease

## 2016-12-22 VITALS — BP 160/90 | HR 66 | Ht 65.5 in | Wt 235.2 lb

## 2016-12-22 DIAGNOSIS — R0602 Shortness of breath: Secondary | ICD-10-CM | POA: Diagnosis not present

## 2016-12-22 DIAGNOSIS — R05 Cough: Secondary | ICD-10-CM

## 2016-12-22 DIAGNOSIS — R059 Cough, unspecified: Secondary | ICD-10-CM

## 2016-12-22 LAB — NITRIC OXIDE: NITRIC OXIDE: 31

## 2016-12-22 NOTE — Patient Instructions (Signed)
Continue using the Qvar and albuterol rescue inhaler Finish the prednisone taper. Continue the Singulair We'll check an FENO and pulmonary function tests  If his symptoms continue unchanged we can consider adding an antihistamine and Flonase nasal spray and empiric treatment for acid reflux Return to clinic in 3 months.

## 2016-12-22 NOTE — Progress Notes (Signed)
Cardiology Office Note    Date:  12/24/2016   ID:  Emily Wood, DOB 1951/04/25, MRN [id number]  PCP:  Emily Mclean, MD  Cardiologist:  Emily Martinique, MD    History of Present Illness:  Emily Wood is a 66 y.o. female is seen for follow up HTN and  diastolic dysfunction. She has a history of DM type 2 and longstanding HTN.  She had a recent CXR done that showed borderline CM and diffuse pulmonary interstitial prominence c/w bronchitis. An Echo in February 2018 showed moderate LVH with normal systolic function and grade 2 diastolic dysfunction. There was evidence of moderate pulmonary HTN. She has chronic LE swelling for many years related to venous insufficiency. She had a Myoview study in March 2018 that was normal.  She was recently seen by primary care for persistent cough. Referred to pulmonary and seen yesterday. On course of prednisone and started on Qvar. PFTs ordered.   Since March she has lost 13 lbs. She has continued to have poorly controlled HTN. She is followed by Dr. Murlean Iba in Nephrology. Seen last month and aldosterone/renin ratio ordered. Some consideration of getting a sleep study. Reports seeing a vascular physician in [place] who also recommended compression hose. She only complains of SOB when bending over. Activity limited by arthritic hip.   Past Medical History:  Diagnosis Date  . Arthritis   . Diabetes mellitus without complication ([place])   . Hypertension     Past Surgical History:  Procedure Laterality Date  . CHOLECYSTECTOMY    . HERNIA REPAIR    . OTHER SURGICAL HISTORY     scar tissue removal from bowels  . PARTIAL HYSTERECTOMY     Still has ovaries  . UTERINE FIBROID SURGERY      Current Medications: Outpatient Medications Prior to Visit  Medication Sig Dispense Refill  . Albuterol Sulfate (PROAIR RESPICLICK) 539 (90 Base) MCG/ACT AEPB Inhale 2 puffs into the lungs every 6 (six) hours as needed. 1 each 3  . Alpha-Lipoic Acid  300 MG CAPS Take 2 capsules by mouth daily.    Marland Kitchen aspirin 81 MG tablet Take 81 mg by mouth daily.    . beclomethasone (QVAR) 40 MCG/ACT inhaler Inhale 2 puffs into the lungs 2 (two) times daily. 1 Inhaler 6  . cloNIDine (CATAPRES) 0.1 MG tablet Take 0.1 mg by mouth 3 (three) times daily.    Marland Kitchen gabapentin (NEURONTIN) 400 MG capsule Take 2 capsules by mouth 3 (three) times daily.   0  . metFORMIN (GLUCOPHAGE) 500 MG tablet Take 1 tablet (500 mg total) by mouth 2 (two) times daily with a meal. 180 tablet 2  . metoprolol (LOPRESSOR) 50 MG tablet Take 1 tablet (50 mg total) by mouth 2 (two) times daily. 180 tablet 3  . montelukast (SINGULAIR) 10 MG tablet Take 1 tablet (10 mg total) by mouth at bedtime. 30 tablet 3  . OVER THE COUNTER MEDICATION Take 1 capsule by mouth daily. ARTHROCEN 300 mg     . potassium chloride SA (K-DUR,KLOR-CON) 20 MEQ tablet Take 1 tablet (20 mEq total) by mouth daily. 90 tablet 3  . predniSONE (DELTASONE) 20 MG tablet Take 1 pill daily for 7 days 7 tablet 0  . valsartan-hydrochlorothiazide (DIOVAN-HCT) 320-25 MG tablet   0  . HYDROcodone-homatropine (HYCODAN) 5-1.5 MG/5ML syrup Take 5 mLs by mouth every 8 (eight) hours as needed for cough. 90 mL 0   No facility-administered medications prior to visit.  Allergies:   Patient has no known allergies.   Social History   Social History  . Marital status: Married    Spouse name: Emily Wood  . Number of children: 0  . Years of education: 50   Occupational History  . customer service    Social History Main Topics  . Smoking status: Never Smoker  . Smokeless tobacco: Never Used  . Alcohol use No  . Drug use: No  . Sexual activity: Not Asked   Other Topics Concern  . None   Social History Narrative   Lives with husband   Caffeine use: 16oz coffee per day     Family History:  The patient's family history is negative for cardiac disease. Mother died in [place]. Father died of unknown causes.   ROS:   Please see the  history of present illness.    ROS All other systems reviewed and are negative.   PHYSICAL EXAM:   VS:  BP (!) 166/96   Pulse 70   Ht 5' 5.5" (1.664 m)   Wt 237 lb 3.2 oz (107.6 kg)   BMI 38.87 kg/m    GEN: Well nourished, well developed, in no acute distress  HEENT: normal  Neck: no JVD, carotid bruits, or masses Cardiac: RRR; no murmurs, rubs, or gallops,no edema  Respiratory:  clear to auscultation bilaterally, normal work of breathing GI: soft, nontender, nondistended, + BS Skin: warm and dry LE with 2+ edema to the knees L>R. Chronic venous stasis skin changes. Neuro:  Alert and Oriented x 3, Strength and sensation are intact Psych: euthymic mood, full affect  Wt Readings from Last 3 Encounters:  12/24/16 237 lb 3.2 oz (107.6 kg)  12/22/16 235 lb 3.2 oz (106.7 kg)  12/16/16 240 lb 9.6 oz (109.1 kg)      Studies/Labs Reviewed:   EKG:  EKG is not ordered today.     Recent Labs: 07/15/2016: ALT 13 11/26/2016: Hemoglobin 10.4; Platelets 396.0 12/16/2016: BUN 19; Creatinine, Ser 1.02; Potassium 4.2; Sodium 136   Lipid Panel    Component Value Date/Time   CHOL 177 07/15/2016 0718   TRIG 82.0 07/15/2016 0718   HDL 58.90 07/15/2016 0718   CHOLHDL 3 07/15/2016 0718   VLDL 16.4 07/15/2016 0718   LDLCALC 102 (H) 07/15/2016 0718    Additional studies/ records that were reviewed today include:    Echo 09/16/16: Study Conclusions  - Left ventricle: The cavity size was normal. Wall thickness was   increased in a pattern of moderate LVH. Systolic function was   normal. The estimated ejection fraction was in the range of 60%   to 65%. Wall motion was normal; there were no regional wall   motion abnormalities. Features are consistent with a pseudonormal   left ventricular filling pattern, with concomitant abnormal   relaxation and increased filling pressure (grade 2 diastolic   dysfunction). - Mitral valve: There was mild regurgitation. - Right atrium: The atrium was  mildly dilated. - Pulmonary arteries: Systolic pressure was moderately increased.   PA peak pressure: 52 mm Hg (S).  Myoview 10/16/16: Study Highlights     The left ventricular ejection fraction is normal (55-65%).  Nuclear stress EF: 61%.  There was no ST segment deviation noted during stress.  The study is normal.  This is a low risk study.   Normal pharmacologic nuclear stress test with no prior infarct or ischemia.    ASSESSMENT:    1. Hypertensive heart disease without heart failure   2.  Essential hypertension   3. Cardiomegaly   4. Chronic venous insufficiency      PLAN:  In order of problems listed above:  1. She has chronic venous insufficiency. Recommend conservative measures with sodium restriction, elevation of legs, and compression hose.  Consider diuretics if swelling. My impression is that swelling is better today and weight is down.   2. She has hypertensive heart disease with diastolic dysfunction. Stressed importance of good BP control. This is being primarily managed by Nephrology. Encourage lifestyle modification with restriction of sodium, weight loss, and increased aerobic activity- consider water aerobics.  3. Moderate pulmonary HTN noted on Echo but no RV/RA enlargement or right heart failure. Related to diastolic dysfunction and obesity.   At this point I do not have anything further to add to her multiple consultants. I will see as needed.    Medication Adjustments/Labs and Tests Ordered: Current medicines are reviewed at length with the patient today.  Concerns regarding medicines are outlined above.  Medication changes, Labs and Tests ordered today are listed in the Patient Instructions below. Patient Instructions  Continue your current therapy  I will see you as needed      Signed, Emily Martinique, MD  12/24/2016 10:01 AM    [facility] [address], [place], [place], [address] [phone]

## 2016-12-22 NOTE — Progress Notes (Signed)
Emily Wood    [id number]    May 02, 1951  Primary Care Physician:Copland, Gay Filler, MD  Referring Physician: Darreld Mclean, [address] [place], [place] [address]  Chief complaint:  Consult for cough, bronchitis  HPI: 66 year old with history of arthritis, diabetes, hypertension. She had an episode of bronchitis present illness low-grade fever and cough in February 2018. She was treated with Levaquin and cough syrup but and has a persistent cough since then, nonproductive in nature associated with dyspnea on exertion. No wheezing. She was treated with Qvar and albuterol with minimal improvement in symptoms. At last visit with Dr. Lorelei Pont she was given prednisone and similar. She's been on prednisone for 4 days and reports improving symptoms.. She has stopped Qvar while on the prednisone.  She does not report seasonal allergies, rhinitis, postnasal drip. She does not report GERD, acid reflux. She is on ARB for blood pressure medication but has been on this for many years without any issues. She is also on gabapentin for meralgia paraesthetica  Pets: None  Occupation: Therapist, art for [employer] Exposures: No known exposures Smoking history: None  Outpatient Encounter Prescriptions as of 12/22/2016  Medication Sig  . Albuterol Sulfate (PROAIR RESPICLICK) 423 (90 Base) MCG/ACT AEPB Inhale 2 puffs into the lungs every 6 (six) hours as needed.  . Alpha-Lipoic Acid 300 MG CAPS Take 2 capsules by mouth daily.  Marland Kitchen aspirin 81 MG tablet Take 81 mg by mouth daily.  . cloNIDine (CATAPRES) 0.1 MG tablet Take 0.1 mg by mouth 3 (three) times daily.  Marland Kitchen gabapentin (NEURONTIN) 400 MG capsule Take 2 capsules by mouth 3 (three) times daily.   Marland Kitchen HYDROcodone-homatropine (HYCODAN) 5-1.5 MG/5ML syrup Take 5 mLs by mouth every 8 (eight) hours as needed for cough.  . metFORMIN (GLUCOPHAGE) 500 MG tablet Take 1 tablet (500 mg total) by mouth 2 (two) times  daily with a meal.  . metoprolol (LOPRESSOR) 50 MG tablet Take 1 tablet (50 mg total) by mouth 2 (two) times daily.  . montelukast (SINGULAIR) 10 MG tablet Take 1 tablet (10 mg total) by mouth at bedtime.  Marland Kitchen OVER THE COUNTER MEDICATION Take 1 capsule by mouth daily. ARTHROCEN 300 mg   . potassium chloride SA (K-DUR,KLOR-CON) 20 MEQ tablet Take 1 tablet (20 mEq total) by mouth daily.  . predniSONE (DELTASONE) 20 MG tablet Take 1 pill daily for 7 days  . valsartan-hydrochlorothiazide (DIOVAN-HCT) 320-25 MG tablet   . [DISCONTINUED] furosemide (LASIX) 40 MG tablet furosemide 40 mg tablet  Take 1 tablet every day by oral route.  . [DISCONTINUED] losartan-hydrochlorothiazide (HYZAAR) 100-25 MG tablet TAKE 1 BY MOUTH DAILY  . beclomethasone (QVAR) 40 MCG/ACT inhaler Inhale 2 puffs into the lungs 2 (two) times daily. (Patient not taking: Reported on 12/22/2016)   No facility-administered encounter medications on file as of 12/22/2016.     Allergies as of 12/22/2016  . (No Known Allergies)    Past Medical History:  Diagnosis Date  . Arthritis   . Diabetes mellitus without complication ([place])   . Hypertension     Past Surgical History:  Procedure Laterality Date  . CHOLECYSTECTOMY    . HERNIA REPAIR    . OTHER SURGICAL HISTORY     scar tissue removal from bowels  . PARTIAL HYSTERECTOMY     Still has ovaries  . UTERINE FIBROID SURGERY      Family History  Problem Relation Age of Onset  .  Hyperlipidemia Mother   . Hyperlipidemia Maternal Aunt     Social History   Social History  . Marital status: Married    Spouse name: [name]  . Number of children: 0  . Years of education: 63   Occupational History  . customer service    Social History Main Topics  . Smoking status: Never Smoker  . Smokeless tobacco: Never Used  . Alcohol use No  . Drug use: No  . Sexual activity: Not on file   Other Topics Concern  . Not on file   Social History Narrative   Lives with husband    Caffeine use: 16oz coffee per day    Review of systems: Review of Systems  Constitutional: Negative for fever and chills.  HENT: Negative.   Eyes: Negative for blurred vision.  Respiratory: as per HPI  Cardiovascular: Negative for chest pain and palpitations.  Gastrointestinal: Negative for vomiting, diarrhea, blood per rectum. Genitourinary: Negative for dysuria, urgency, frequency and hematuria.  Musculoskeletal: Negative for myalgias, back pain and joint pain.  Skin: Negative for itching and rash.  Neurological: Negative for dizziness, tremors, focal weakness, seizures and loss of consciousness.  Endo/Heme/Allergies: Negative for environmental allergies.  Psychiatric/Behavioral: Negative for depression, suicidal ideas and hallucinations.  All other systems reviewed and are negative.  Physical Exam: Blood pressure (!) 160/90, pulse 66, height 5' 5.5" (1.664 m), weight 235 lb 3.2 oz (106.7 kg), SpO2 99 %. Gen:      No acute distress HEENT:  EOMI, sclera anicteric Neck:     No masses; no thyromegaly Lungs:    Clear to auscultation bilaterally; normal respiratory effort CV:         Regular rate and rhythm; no murmurs Abd:      + bowel sounds; soft, non-tender; no palpable masses, no distension Ext:    No edema; adequate peripheral perfusion Skin:      Warm and dry; no rash Neuro: alert and oriented x 3 Psych: normal mood and affect  Data Reviewed: QuantiFERON 12/16/16-negative  Chest x-ray 12/16/16-mild chronic bronchitis. I have reviewed images personally.  Echo 09/16/16 - Left ventricle: The cavity size was normal. Wall thickness was   increased in a pattern of moderate LVH. Systolic function was   normal. The estimated ejection fraction was in the range of 60%   to 65%. Wall motion was normal; there were no regional wall   motion abnormalities. Features are consistent with a pseudonormal   left ventricular filling pattern, with concomitant abnormal   relaxation and  increased filling pressure (grade 2 diastolic   dysfunction). - Mitral valve: There was mild regurgitation. - Right atrium: The atrium was mildly dilated. - Pulmonary arteries: Systolic pressure was moderately increased.   PA peak pressure: 52 mm Hg (S).  FENO 12/22/16- 31  Assessment:   Evaluation for cough. She likely has persistent postinflammatory cough after an episode of bronchitis in February 2018. Her FENO was slightly elevated in the office today indicating ongoing airway inflammation. She has responded to the prednisone and has 3 more days left. I have asked her to get back on the Qvar and continue the albuterol when necessary.  We will schedule pulmonary function tests for further evaluation. If her symptoms continue unabated we may consider treating postnasal drip with antihistamine, Flonase and silent reflux with PPI but she wants to avoid any additional medications at this point I educated her on behavioral changes to deal with cough including conscious suppression of the urge to cough,  use of throat lozenges.  Plan/Recommendations: - Continue pred course for 3 more days - Resume qvar, albuterol - Check PFTs  Marshell Garfinkel MD [facility] Pager [phone] 12/22/2016, 4:13 PM  CC: Copland, Gay Filler, MD

## 2016-12-24 ENCOUNTER — Ambulatory Visit (INDEPENDENT_AMBULATORY_CARE_PROVIDER_SITE_OTHER): Payer: BLUE CROSS/BLUE SHIELD | Admitting: Cardiology

## 2016-12-24 ENCOUNTER — Encounter: Payer: Self-pay | Admitting: Cardiology

## 2016-12-24 VITALS — BP 166/96 | HR 70 | Ht 65.5 in | Wt 237.2 lb

## 2016-12-24 DIAGNOSIS — I119 Hypertensive heart disease without heart failure: Secondary | ICD-10-CM

## 2016-12-24 DIAGNOSIS — I10 Essential (primary) hypertension: Secondary | ICD-10-CM | POA: Diagnosis not present

## 2016-12-24 DIAGNOSIS — I872 Venous insufficiency (chronic) (peripheral): Secondary | ICD-10-CM

## 2016-12-24 DIAGNOSIS — I517 Cardiomegaly: Secondary | ICD-10-CM

## 2016-12-24 NOTE — Patient Instructions (Signed)
Continue your current therapy  I will see you as needed. 

## 2016-12-31 ENCOUNTER — Ambulatory Visit (INDEPENDENT_AMBULATORY_CARE_PROVIDER_SITE_OTHER): Payer: BLUE CROSS/BLUE SHIELD | Admitting: Family Medicine

## 2016-12-31 VITALS — BP 154/88 | HR 88 | Temp 98.7°F | Ht 65.5 in | Wt 239.6 lb

## 2016-12-31 DIAGNOSIS — R053 Chronic cough: Secondary | ICD-10-CM

## 2016-12-31 DIAGNOSIS — Z23 Encounter for immunization: Secondary | ICD-10-CM | POA: Diagnosis not present

## 2016-12-31 DIAGNOSIS — R05 Cough: Secondary | ICD-10-CM

## 2016-12-31 DIAGNOSIS — Z5181 Encounter for therapeutic drug level monitoring: Secondary | ICD-10-CM | POA: Diagnosis not present

## 2016-12-31 DIAGNOSIS — I10 Essential (primary) hypertension: Secondary | ICD-10-CM | POA: Diagnosis not present

## 2016-12-31 NOTE — Progress Notes (Signed)
[facility] at [facility] [address], [place], [place] [address] [phone]  Date:  12/31/2016   Name:  Emily Wood   DOB:  1951-02-04   MRN:  [id number]  PCP:  Darreld Mclean, MD    Chief Complaint: Follow-up (Pt here for one month f/u visit.    239.6lbs)   History of Present Illness:  Emily Wood is a 66 y.o. very pleasant female patient who presents with the following:  She was here 2 weeks ago and we did further work- up for chronic cough, including referral to pulmonology Here today with concern of   History of HTN, obesity, DM  Her cough is gone- the prednisone seems to have worked  She is finished with this medication She is still taking singulair Her TB testing was negative She is still using her qvar at bedtime She is not using the albuterol as she does not need it   She did have a pulmonology appt with Dr. Vaughan Browner- they did a breathing test  BP Readings from Last 3 Encounters:  12/31/16 (!) 178/88  12/24/16 (!) 166/96  12/22/16 (!) 160/90    Lab Results  Component Value Date   HGBA1C 7.2 (H) 11/26/2016   Her BP is high today- will repeat for her today.  She is on clonidine, metoprolol, diovan hct  Her last colonoscopy was about 5 years ago- she reports that it was normal as far as she can remember  She is seeing a new GYN doctor- [facility]  Her last tetanus shot was perhaps 10 years ago- she is really not sure Would like to update today   She does not monitor her BP at home- her old one broke  She geneally takes her BP meds on a regular basis- She did forget her 3pm clonidine today however which may cause some rebound hypertension  She saw her cardiologist last week, also sees nephrology  Patient Active Problem List   Diagnosis Date Noted  . Hypertensive heart disease without heart failure 10/01/2016  . Chronic kidney disease, stage 3 09/21/2016  . Meralgia paresthetica of  left side 08/27/2015  . Diabetes mellitus type 2, controlled ([place]) 08/02/2015  . Chronic venous insufficiency 06/28/2015  . Essential hypertension 02/15/2015  . Peripheral edema 02/15/2015  . Chronic knee pain 02/15/2015    Past Medical History:  Diagnosis Date  . Arthritis   . Diabetes mellitus without complication ([place])   . Hypertension     Past Surgical History:  Procedure Laterality Date  . CHOLECYSTECTOMY    . HERNIA REPAIR    . OTHER SURGICAL HISTORY     scar tissue removal from bowels  . PARTIAL HYSTERECTOMY     Still has ovaries  . UTERINE FIBROID SURGERY      Social History  Substance Use Topics  . Smoking status: Never Smoker  . Smokeless tobacco: Never Used  . Alcohol use No    Family History  Problem Relation Age of Onset  . Hyperlipidemia Mother   . Hyperlipidemia Maternal Aunt     No Known Allergies  Medication list has been reviewed and updated.  Current Outpatient Prescriptions on File Prior to Visit  Medication Sig Dispense Refill  . Albuterol Sulfate (PROAIR RESPICLICK) 076 (90 Base) MCG/ACT AEPB Inhale 2 puffs into the lungs every 6 (six) hours as needed. 1 each 3  . Alpha-Lipoic Acid 300 MG CAPS Take 2 capsules by mouth daily.    Marland Kitchen  aspirin 81 MG tablet Take 81 mg by mouth daily.    . beclomethasone (QVAR) 40 MCG/ACT inhaler Inhale 2 puffs into the lungs 2 (two) times daily. 1 Inhaler 6  . cloNIDine (CATAPRES) 0.1 MG tablet Take 0.1 mg by mouth 3 (three) times daily.    Marland Kitchen gabapentin (NEURONTIN) 400 MG capsule Take 2 capsules by mouth 3 (three) times daily.   0  . metFORMIN (GLUCOPHAGE) 500 MG tablet Take 1 tablet (500 mg total) by mouth 2 (two) times daily with a meal. 180 tablet 2  . metoprolol (LOPRESSOR) 50 MG tablet Take 1 tablet (50 mg total) by mouth 2 (two) times daily. 180 tablet 3  . montelukast (SINGULAIR) 10 MG tablet Take 1 tablet (10 mg total) by mouth at bedtime. 30 tablet 3  . OVER THE COUNTER MEDICATION Take 1 capsule by mouth  daily. ARTHROCEN 300 mg     . potassium chloride SA (K-DUR,KLOR-CON) 20 MEQ tablet Take 1 tablet (20 mEq total) by mouth daily. 90 tablet 3  . valsartan-hydrochlorothiazide (DIOVAN-HCT) 320-25 MG tablet   0   No current facility-administered medications on file prior to visit.     Review of Systems:  As per HPI- otherwise negative.   Physical Examination: Vitals:   12/31/16 1709  BP: (!) 178/88  Pulse: 88  Temp: 98.7 F (37.1 C)   Vitals:   12/31/16 1709  Weight: 239 lb 9.6 oz (108.7 kg)  Height: 5' 5.5" (1.664 m)   Body mass index is 39.27 kg/m. Ideal Body Weight: Weight in (lb) to have BMI = 25: 152.2  GEN: WDWN, NAD, Non-toxic, A & O x 3, obese, looks well HEENT: Atraumatic, Normocephalic. Neck supple. No masses, No LAD.  Bilateral TM wnl, oropharynx normal.  PEERL,EOMI.   Ears and Nose: No external deformity. CV: RRR, No M/G/R. No JVD. No thrill. No extra heart sounds. PULM: CTA B, no wheezes, crackles, rhonchi. No retractions. No resp. distress. No accessory muscle use. ABD: S, NT, ND, +BS. No rebound. No HSM. EXTR: No c/c.  Stable LE edema NEURO Normal gait.  PSYCH: Normally interactive. Conversant. Not depressed or anxious appearing.  Calm demeanor.    Assessment and Plan: Persistent cough  Immunization due - Plan: Tdap vaccine greater than or equal to 7yo IM  Uncontrolled hypertension  Medication monitoring encounter  Here today to recheck on her cough- however she is also seeing pulmonology and her cough is now better s/p a week of prednisone She is due for a tdap- would like to give her this before she starts medicare as not covered by medicare. Will give to her today encouraged her to monitor her home BP and she plans to get a new machine to do so   Signed [name], MD

## 2016-12-31 NOTE — Patient Instructions (Signed)
You got your tetanus booster today Take care and let me know if you need anything  Please do monitor your blood pressure at home- we would like for you to be under 140/85; let me know if this is not the case

## 2017-01-14 ENCOUNTER — Encounter: Payer: Self-pay | Admitting: Obstetrics and Gynecology

## 2017-01-14 ENCOUNTER — Ambulatory Visit (INDEPENDENT_AMBULATORY_CARE_PROVIDER_SITE_OTHER): Payer: BLUE CROSS/BLUE SHIELD | Admitting: Obstetrics and Gynecology

## 2017-01-14 DIAGNOSIS — Z13820 Encounter for screening for osteoporosis: Secondary | ICD-10-CM | POA: Diagnosis not present

## 2017-01-14 DIAGNOSIS — Z01411 Encounter for gynecological examination (general) (routine) with abnormal findings: Secondary | ICD-10-CM

## 2017-01-14 DIAGNOSIS — Z124 Encounter for screening for malignant neoplasm of cervix: Secondary | ICD-10-CM | POA: Diagnosis not present

## 2017-01-14 DIAGNOSIS — Z1321 Encounter for screening for nutritional disorder: Secondary | ICD-10-CM | POA: Diagnosis not present

## 2017-01-14 DIAGNOSIS — D252 Subserosal leiomyoma of uterus: Secondary | ICD-10-CM

## 2017-01-14 DIAGNOSIS — Z1239 Encounter for other screening for malignant neoplasm of breast: Secondary | ICD-10-CM

## 2017-01-14 DIAGNOSIS — Z1231 Encounter for screening mammogram for malignant neoplasm of breast: Secondary | ICD-10-CM

## 2017-01-14 DIAGNOSIS — D251 Intramural leiomyoma of uterus: Secondary | ICD-10-CM | POA: Diagnosis not present

## 2017-01-14 DIAGNOSIS — Z1211 Encounter for screening for malignant neoplasm of colon: Secondary | ICD-10-CM

## 2017-01-14 DIAGNOSIS — D259 Leiomyoma of uterus, unspecified: Secondary | ICD-10-CM | POA: Insufficient documentation

## 2017-01-14 NOTE — Patient Instructions (Signed)
Preventive Care 40-64 Years, Female Preventive care refers to lifestyle choices and visits with your health care provider that can promote health and wellness. What does preventive care include?  A yearly physical exam. This is also called an annual well check.  Dental exams once or twice a year.  Routine eye exams. Ask your health care provider how often you should have your eyes checked.  Personal lifestyle choices, including: ? Daily care of your teeth and gums. ? Regular physical activity. ? Eating a healthy diet. ? Avoiding tobacco and drug use. ? Limiting alcohol use. ? Practicing safe sex. ? Taking low-dose aspirin daily starting at age 58. ? Taking vitamin and mineral supplements as recommended by your health care provider. What happens during an annual well check? The services and screenings done by your health care provider during your annual well check will depend on your age, overall health, lifestyle risk factors, and family history of disease. Counseling Your health care provider may ask you questions about your:  Alcohol use.  Tobacco use.  Drug use.  Emotional well-being.  Home and relationship well-being.  Sexual activity.  Eating habits.  Work and work Statistician.  Method of birth control.  Menstrual cycle.  Pregnancy history.  Screening You may have the following tests or measurements:  Height, weight, and BMI.  Blood pressure.  Lipid and cholesterol levels. These may be checked every 5 years, or more frequently if you are over 81 years old.  Skin check.  Lung cancer screening. You may have this screening every year starting at age 78 if you have a 30-pack-year history of smoking and currently smoke or have quit within the past 15 years.  Fecal occult blood test (FOBT) of the stool. You may have this test every year starting at age 65.  Flexible sigmoidoscopy or colonoscopy. You may have a sigmoidoscopy every 5 years or a colonoscopy  every 10 years starting at age 30.  Hepatitis C blood test.  Hepatitis B blood test.  Sexually transmitted disease (STD) testing.  Diabetes screening. This is done by checking your blood sugar (glucose) after you have not eaten for a while (fasting). You may have this done every 1-3 years.  Mammogram. This may be done every 1-2 years. Talk to your health care provider about when you should start having regular mammograms. This may depend on whether you have a family history of breast cancer.  BRCA-related cancer screening. This may be done if you have a family history of breast, ovarian, tubal, or peritoneal cancers.  Pelvic exam and Pap test. This may be done every 3 years starting at age 80. Starting at age 36, this may be done every 5 years if you have a Pap test in combination with an HPV test.  Bone density scan. This is done to screen for osteoporosis. You may have this scan if you are at high risk for osteoporosis.  Discuss your test results, treatment options, and if necessary, the need for more tests with your health care provider. Vaccines Your health care provider may recommend certain vaccines, such as:  Influenza vaccine. This is recommended every year.  Tetanus, diphtheria, and acellular pertussis (Tdap, Td) vaccine. You may need a Td booster every 10 years.  Varicella vaccine. You may need this if you have not been vaccinated.  Zoster vaccine. You may need this after age 5.  Measles, mumps, and rubella (MMR) vaccine. You may need at least one dose of MMR if you were born in  1957 or later. You may also need a second dose.  Pneumococcal 13-valent conjugate (PCV13) vaccine. You may need this if you have certain conditions and were not previously vaccinated.  Pneumococcal polysaccharide (PPSV23) vaccine. You may need one or two doses if you smoke cigarettes or if you have certain conditions.  Meningococcal vaccine. You may need this if you have certain  conditions.  Hepatitis A vaccine. You may need this if you have certain conditions or if you travel or work in places where you may be exposed to hepatitis A.  Hepatitis B vaccine. You may need this if you have certain conditions or if you travel or work in places where you may be exposed to hepatitis B.  Haemophilus influenzae type b (Hib) vaccine. You may need this if you have certain conditions.  Talk to your health care provider about which screenings and vaccines you need and how often you need them. This information is not intended to replace advice given to you by your health care provider. Make sure you discuss any questions you have with your health care provider. Document Released: 08/02/2015 Document Revised: 03/25/2016 Document Reviewed: 05/07/2015 Elsevier Interactive Patient Education  2017 [employer].

## 2017-01-14 NOTE — Progress Notes (Signed)
Patient ID: Emily Wood, female   DOB: 04-08-1951, 66 y.o.   MRN: [id number]     Gynecology Annual Exam  PCP: Darreld Mclean, MD  Chief Complaint:  Chief Complaint  Patient presents with  . Gynecologic Exam    History of Present Illness:Patient is a 66 y.o. G1P0010 presents for annual exam. The patient has no complaints today.   LMP: No LMP recorded. Patient is postmenopausal. No postmenopausal bleeding no vasomotor symptoms   The patient is not sexually active. She denies dyspareunia.  The patient does perform self breast exams.  There is no notable family history of breast or ovarian cancer in her family.  The patient wears seatbelts: yes.   The patient has regular exercise: not asked.    The patient denies current symptoms of depression.     Review of Systems: Review of Systems  Constitutional: Negative for chills and fever.  HENT: Negative for congestion.   Respiratory: Negative for cough and shortness of breath.   Cardiovascular: Negative for chest pain and palpitations.  Gastrointestinal: Negative for abdominal pain, constipation, diarrhea, heartburn, nausea and vomiting.  Genitourinary: Negative for dysuria, frequency and urgency.  Skin: Negative for itching and rash.  Neurological: Negative for dizziness and headaches.  Endo/Heme/Allergies: Negative for polydipsia.  Psychiatric/Behavioral: Negative for depression.    Past Medical History:  Past Medical History:  Diagnosis Date  . Arthritis   . Diabetes mellitus without complication ([place])   . Hypertension     Past Surgical History:  Past Surgical History:  Procedure Laterality Date  . CHOLECYSTECTOMY    . HERNIA REPAIR    . OTHER SURGICAL HISTORY     scar tissue removal from bowels  . PARTIAL HYSTERECTOMY     Still has ovaries  . UTERINE FIBROID SURGERY      Gynecologic History:  No LMP recorded. Patient is postmenopausal.  Obstetric History: G1P0010  Family History:  Family History    Problem Relation Age of Onset  . Hyperlipidemia Mother   . Hyperlipidemia Maternal Aunt     Social History:  Social History   Social History  . Marital status: Married    Spouse name: [name]  . Number of children: 0  . Years of education: 46   Occupational History  . customer service    Social History Main Topics  . Smoking status: Never Smoker  . Smokeless tobacco: Never Used  . Alcohol use No  . Drug use: No  . Sexual activity: Not Currently    Birth control/ protection: Post-menopausal   Other Topics Concern  . Not on file   Social History Narrative   Lives with husband   Caffeine use: 16oz coffee per day    Allergies:  No Known Allergies  Medications: Prior to Admission medications   Medication Sig Start Date End Date Taking? Authorizing Provider  Albuterol Sulfate (PROAIR RESPICLICK) 563 (90 Base) MCG/ACT AEPB Inhale 2 puffs into the lungs every 6 (six) hours as needed. 10/01/16   Copland, Gay Filler, MD  Alpha-Lipoic Acid 300 MG CAPS Take 2 capsules by mouth daily.    [provider]  aspirin 81 MG tablet Take 81 mg by mouth daily.    [provider]  beclomethasone (QVAR) 40 MCG/ACT inhaler Inhale 2 puffs into the lungs 2 (two) times daily. 11/26/16   Copland, Gay Filler, MD  cloNIDine (CATAPRES) 0.1 MG tablet Take 0.1 mg by mouth 3 (three) times daily.    [provider]  gabapentin (NEURONTIN) 400  MG capsule Take 2 capsules by mouth 3 (three) times daily.  07/16/16   [provider]  metFORMIN (GLUCOPHAGE) 500 MG tablet Take 1 tablet (500 mg total) by mouth 2 (two) times daily with a meal. 06/04/16   Copland, Gay Filler, MD  metoprolol (LOPRESSOR) 50 MG tablet Take 1 tablet (50 mg total) by mouth 2 (two) times daily. 06/04/16   Copland, Gay Filler, MD  montelukast (SINGULAIR) 10 MG tablet Take 1 tablet (10 mg total) by mouth at bedtime. 12/16/16   Copland, Gay Filler, MD  OVER THE COUNTER MEDICATION Take 1 capsule by mouth daily.  ARTHROCEN 300 mg     [provider]  potassium chloride SA (K-DUR,KLOR-CON) 20 MEQ tablet Take 1 tablet (20 mEq total) by mouth daily. 06/04/16   Copland, Gay Filler, MD  valsartan-hydrochlorothiazide (DIOVAN-HCT) 320-25 MG tablet  11/20/16   [provider]    Physical Exam Vitals: Blood pressure (!) 160/108, pulse 73, height 5\' 6"  (1.676 m), weight 244 lb (110.7 kg).  General: NAD HEENT: normocephalic, anicteric Thyroid: no enlargement, no palpable nodules Pulmonary: No increased work of breathing, CTAB Cardiovascular: RRR, distal pulses 2+ Breast: Breast symmetrical, no tenderness, no palpable nodules or masses, no skin or nipple retraction present, no nipple discharge.  No axillary or supraclavicular lymphadenopathy. Abdomen: NABS, soft, non-tender, non-distended.  Umbilicus without lesions.  No hepatomegaly, splenomegaly or masses palpable. No evidence of hernia  Genitourinary:  External: Normal external female genitalia.  Normal urethral meatus, normal Bartholin's and Skene's glands.    Vagina: Normal vaginal mucosa, no evidence of prolapse.    Cervix: Grossly normal in appearance, no bleeding  Uterus: Mildly enlarged, mobile, normal contour.  No CMT, exam limited by habitus  Adnexa: ovaries non-enlarged, no adnexal masses  Rectal: deferred  Lymphatic: no evidence of inguinal lymphadenopathy Extremities: no edema, erythema, or tenderness Neurologic: Grossly intact Psychiatric: mood appropriate, affect full  Female chaperone present for pelvic and breast  portions of the physical exam    Assessment: 66 y.o. G1P0010 annual exam  Plan: Problem List Items Addressed This Visit      Genitourinary   Fibroid uterus    Other Visit Diagnoses    Screening for malignant neoplasm of cervix       Relevant Orders   PapIG, HPV, rfx 16/18   Breast screening       Relevant Orders   MM DIGITAL SCREENING BILATERAL   Special screening for malignant neoplasms, colon         Encounter for gynecological examination with abnormal finding       Screening for osteoporosis       Relevant Orders   DG Bone Density   Vitamin D 25 hydroxy   Encounter for vitamin deficiency screening       Relevant Orders   Vitamin D 25 hydroxy      1) Mammogram - recommend yearly screening mammogram.  Mammogram Was ordered today  2) STI screening was not offered low risk  3) ASCCP guidelines and rational discussed.  Upper limit of age at which pap may be stopped age 32 so if normal this year does not need further  4) Osteoporosis  - per USPTF routine screening DEXA at age 90 - FRAX 58 year major fracture risk 6.3,  10 year hip fracture risk 0.6 - check vitamin D level  5) Routine healthcare maintenance including cholesterol, diabetes screening discussed managed by PCP  6) Colonoscopy -  Screening recommended starting at age 53 for  average risk individuals, age 44 for individuals deemed at increased risk (including African Americans) and recommended to continue until age 82.  For patient age 34-85 individualized approach is recommended.  Gold standard screening is via colonoscopy, Cologuard screening is an acceptable alternative for patient unwilling or unable to undergo colonoscopy.  "Colorectal cancer screening for average?risk adults: 2018 guideline update from the [place]: A Cancer Journal for Clinicians: Dec 16, 2016  - 2013 reportedly normal next in 2023  7) Uterine fibroids - if asymptomatic no further follow up warranted, discussed these tend to decrease in size overtime once patient reach menopause  8) Asked about vaginal rejuvenation - discussed this an astehtic precdure note endorsed by [employer] of Obstetric and Gynecology.  She has a normal introitus, no evidence of prolapse.  I discussed risk of dyspareunia.  She is not currently sexually active secondary to erectile dysfunction in her husband.  She denies vaginal dryness.  We discussed  topical estrogen for use in treatment of dyspareunia or vaginal dryness should these symptoms develop.  Vaginal rejuvenation generally is done by plastic surgery if she continuous to express interest  9) Follow up 1 year for routine annual

## 2017-01-15 LAB — VITAMIN D 25 HYDROXY (VIT D DEFICIENCY, FRACTURES): Vit D, 25-Hydroxy: 34 ng/mL (ref 30.0–100.0)

## 2017-01-16 LAB — PAPIG, HPV, RFX 16/18
HPV, HIGH-RISK: NEGATIVE
PAP Smear Comment: 0

## 2017-01-25 ENCOUNTER — Encounter (INDEPENDENT_AMBULATORY_CARE_PROVIDER_SITE_OTHER): Payer: Self-pay | Admitting: Vascular Surgery

## 2017-01-25 ENCOUNTER — Ambulatory Visit (INDEPENDENT_AMBULATORY_CARE_PROVIDER_SITE_OTHER): Payer: PPO | Admitting: Vascular Surgery

## 2017-01-25 ENCOUNTER — Ambulatory Visit (INDEPENDENT_AMBULATORY_CARE_PROVIDER_SITE_OTHER): Payer: PPO

## 2017-01-25 VITALS — BP 142/80 | HR 68 | Resp 16 | Wt 236.0 lb

## 2017-01-25 DIAGNOSIS — E119 Type 2 diabetes mellitus without complications: Secondary | ICD-10-CM | POA: Diagnosis not present

## 2017-01-25 DIAGNOSIS — I10 Essential (primary) hypertension: Secondary | ICD-10-CM | POA: Diagnosis not present

## 2017-01-25 DIAGNOSIS — I872 Venous insufficiency (chronic) (peripheral): Secondary | ICD-10-CM

## 2017-01-25 DIAGNOSIS — R609 Edema, unspecified: Secondary | ICD-10-CM

## 2017-01-25 DIAGNOSIS — I890 Lymphedema, not elsewhere classified: Secondary | ICD-10-CM | POA: Diagnosis not present

## 2017-01-25 NOTE — Progress Notes (Signed)
MRN : [id number]  Emily Wood is a 66 y.o. (Jun 24, 1951) female who presents with chief complaint of  Chief Complaint  Patient presents with  . Re-evaluation  .  History of Present Illness: The patient returns to the office for followup evaluation regarding leg swelling.  The swelling has persisted and the pain associated with swelling continues. There have not been any interval development of a ulcerations or wounds.  Since the previous visit the patient has been wearing graduated compression stockings and has noted little if any improvement in the lymphedema. The patient has been using compression routinely morning until night.  The patient also states elevation during the day and exercise is being done too.  Venous duplex reflux in the common femoral veins bilaterally with chronic changes in the left SFV and popliteal; trivial reflux in the right GSV distally  Current Meds  Medication Sig  . Albuterol Sulfate (PROAIR RESPICLICK) 756 (90 Base) MCG/ACT AEPB Inhale 2 puffs into the lungs every 6 (six) hours as needed.  . Alpha-Lipoic Acid 300 MG CAPS Take 2 capsules by mouth daily.  Marland Kitchen aspirin 81 MG tablet Take 81 mg by mouth daily.  . beclomethasone (QVAR) 40 MCG/ACT inhaler Inhale 2 puffs into the lungs 2 (two) times daily.  . cloNIDine (CATAPRES) 0.1 MG tablet Take 0.1 mg by mouth 3 (three) times daily.  Marland Kitchen gabapentin (NEURONTIN) 400 MG capsule Take 2 capsules by mouth 3 (three) times daily.   . metFORMIN (GLUCOPHAGE) 500 MG tablet Take 1 tablet (500 mg total) by mouth 2 (two) times daily with a meal.  . metoprolol (LOPRESSOR) 50 MG tablet Take 1 tablet (50 mg total) by mouth 2 (two) times daily.  . montelukast (SINGULAIR) 10 MG tablet Take 1 tablet (10 mg total) by mouth at bedtime.  Marland Kitchen OVER THE COUNTER MEDICATION Take 1 capsule by mouth daily. ARTHROCEN 300 mg   . potassium chloride SA (K-DUR,KLOR-CON) 20 MEQ tablet Take 1 tablet (20 mEq total) by mouth daily.  .  valsartan-hydrochlorothiazide (DIOVAN-HCT) 320-25 MG tablet     Past Medical History:  Diagnosis Date  . Arthritis   . Diabetes mellitus without complication ([place])   . Hypertension     Past Surgical History:  Procedure Laterality Date  . CHOLECYSTECTOMY    . HERNIA REPAIR    . OTHER SURGICAL HISTORY     scar tissue removal from bowels  . PARTIAL HYSTERECTOMY     Still has ovaries  . UTERINE FIBROID SURGERY      Social History Social History  Substance Use Topics  . Smoking status: Never Smoker  . Smokeless tobacco: Never Used  . Alcohol use No    Family History Family History  Problem Relation Age of Onset  . Hyperlipidemia Mother   . Hyperlipidemia Maternal Aunt     No Known Allergies   REVIEW OF SYSTEMS (Negative unless checked)  Constitutional: [] Weight loss  [] Fever  [] Chills Cardiac: [] Chest pain   [] Chest pressure   [] Palpitations   [] Shortness of breath when laying flat   [] Shortness of breath with exertion. Vascular:  [] Pain in legs with walking   [x] Pain in legs with standing  [x] History of DVT   [] Phlebitis   [x] Swelling in legs   [] Varicose veins   [] Non-healing ulcers Pulmonary:   [] Uses home oxygen   [] Productive cough   [] Hemoptysis   [] Wheeze  [] COPD   [] Asthma Neurologic:  [] Dizziness   [] Seizures   [] History of stroke   [] History of TIA  []   Aphasia   [] Vissual changes   [] Weakness or numbness in arm   [] Weakness or numbness in leg Musculoskeletal:   [] Joint swelling   [] Joint pain   [] Low back pain Hematologic:  [] Easy bruising  [] Easy bleeding   [] Hypercoagulable state   [] Anemic Gastrointestinal:  [] Diarrhea   [] Vomiting  [] Gastroesophageal reflux/heartburn   [] Difficulty swallowing. Genitourinary:  [] Chronic kidney disease   [] Difficult urination  [] Frequent urination   [] Blood in urine Skin:  [] Rashes   [] Ulcers  Psychological:  [] History of anxiety   []  History of major depression.  Physical Examination  Vitals:   01/25/17 1518  BP: (!)  142/80  Pulse: 68  Resp: 16  Weight: 236 lb (107 kg)   Body mass index is 38.09 kg/m. Gen: WD/WN, NAD Head: [place]/AT, No temporalis wasting.  Ear/Nose/Throat: Hearing grossly intact, nares w/o erythema or drainage Eyes: PER, EOMI, sclera nonicteric.  Neck: Supple, no large masses.   Pulmonary:  Good air movement, no audible wheezing bilaterally, no use of accessory muscles.  Cardiac: RRR, no JVD Vascular: severe venous stasis changes to the legs bilaterally.  3+ soft pitting edema Vessel Right Left  Radial Palpable Palpable  PT Palpable Palpable  DP Palpable Palpable  Gastrointestinal: Non-distended. No guarding/no peritoneal signs.  Musculoskeletal: M/S 5/5 throughout.  No deformity or atrophy.  Neurologic: CN 2-12 intact. Symmetrical.  Speech is fluent. Motor exam as listed above. Psychiatric: Judgment intact, Mood & affect appropriate for pt's clinical situation. Dermatologic: No rashes or ulcers noted.  No changes consistent with cellulitis. Lymph : No lichenification or skin changes of chronic lymphedema.  CBC Lab Results  Component Value Date   WBC 7.5 11/26/2016   HGB 10.4 (L) 11/26/2016   HCT 31.1 (L) 11/26/2016   MCV 84.2 11/26/2016   PLT 396.0 11/26/2016    BMET    Component Value Date/Time   NA 136 12/16/2016 1548   K 4.2 12/16/2016 1548   CL 101 12/16/2016 1548   CO2 28 12/16/2016 1548   GLUCOSE 77 12/16/2016 1548   BUN 19 12/16/2016 1548   CREATININE 1.02 12/16/2016 1548   CREATININE 1.01 (H) 08/26/2015 1203   CALCIUM 9.6 12/16/2016 1548   CrCl cannot be calculated (Patient's most recent lab result is older than the maximum 21 days allowed.).  COAG No results found for: INR, PROTIME  Radiology No results found.   Assessment/Plan 1. Chronic venous insufficiency No surgery or intervention at this point in time.    I have had a long discussion with the patient regarding venous insufficiency and why it  causes symptoms. I have discussed with the  patient the chronic skin changes that accompany venous insufficiency and the long term sequela such as infection and ulceration.  Patient will begin wearing graduated compression stockings class 1 (20-30 mmHg) or compression wraps on a daily basis a prescription was given. The patient will put the stockings on first thing in the morning and removing them in the evening. The patient is instructed specifically not to sleep in the stockings.    In addition, behavioral modification including several periods of elevation of the lower extremities during the day will be continued. I have demonstrated that proper elevation is a position with the ankles at heart level.  The patient is instructed to begin routine exercise, especially walking on a daily basis  Patient should undergo duplex ultrasound of the venous system to ensure that DVT or reflux is not present.  Following the review of the ultrasound the patient  will follow up in 2-3 months to reassess the degree of swelling and the control that graduated compression stockings or compression wraps  is offering.   The patient can be assessed for a Lymph Pump at that timeNo surgery or intervention at this point in time.    I have had a long discussion with the patient regarding venous insufficiency and why it  causes symptoms. I have discussed with the patient the chronic skin changes that accompany venous insufficiency and the long term sequela such as infection and ulceration.  Patient will begin wearing graduated compression stockings class 1 (20-30 mmHg) or compression wraps on a daily basis a prescription was given. The patient will put the stockings on first thing in the morning and removing them in the evening. The patient is instructed specifically not to sleep in the stockings.    In addition, behavioral modification including several periods of elevation of the lower extremities during the day will be continued. I have demonstrated that proper elevation  is a position with the ankles at heart level.  The patient is instructed to begin routine exercise, especially walking on a daily basis  2. Lymphedema Recommend:  No surgery or intervention at this point in time.    I have reviewed my previous discussion with the patient regarding swelling and why it causes symptoms.  Patient will continue wearing graduated compression stockings class 1 (20-30 mmHg) on a daily basis. The patient will  beginning wearing the stockings first thing in the morning and removing them in the evening. The patient is instructed specifically not to sleep in the stockings.    In addition, behavioral modification including several periods of elevation of the lower extremities during the day will be continued.  This was reviewed with the patient during the initial visit.  The patient will also continue routine exercise, especially walking on a daily basis as was discussed during the initial visit.    Despite conservative treatments including graduated compression therapy class 1 and behavioral modification including exercise and elevation the patient  has not obtained adequate control of the lymphedema.  The patient still has stage 3 lymphedema and therefore, I believe that a lymph pump should be added to improve the control of the patient's lymphedema.  Additionally, a lymph pump is warranted because it will reduce the risk of cellulitis and ulceration in the future.  Patient should follow-up in six months    3. Essential hypertension Continue antihypertensive medications as already ordered, these medications have been reviewed and there are no changes at this time.   4. Controlled type 2 diabetes mellitus without complication, without long-term current use of insulin ([place]) Continue hypoglycemic medications as already ordered, these medications have been reviewed and there are no changes at this time.  Hgb A1C to be monitored as already arranged by primary  service   Hortencia Pilar, MD  01/25/2017 10:02 PM

## 2017-03-05 DIAGNOSIS — R600 Localized edema: Secondary | ICD-10-CM | POA: Diagnosis not present

## 2017-03-05 DIAGNOSIS — R809 Proteinuria, unspecified: Secondary | ICD-10-CM | POA: Diagnosis not present

## 2017-03-05 DIAGNOSIS — I10 Essential (primary) hypertension: Secondary | ICD-10-CM | POA: Diagnosis not present

## 2017-03-08 ENCOUNTER — Telehealth (INDEPENDENT_AMBULATORY_CARE_PROVIDER_SITE_OTHER): Payer: Self-pay | Admitting: Vascular Surgery

## 2017-03-08 NOTE — Telephone Encounter (Signed)
Emily Wood, SHE SAID THE THE PLACE WHERE YOU GET A LUMP PUMP THEY ARE WAITING ON HIM TO SIGN PAPERS THEY FAXED OVER LAST WEEK.

## 2017-03-08 NOTE — Telephone Encounter (Signed)
I called the patient phone and left a message for her to call the office concerning her message from this morning

## 2017-03-09 NOTE — Telephone Encounter (Signed)
Lymphedema pump form was fax yesterday to [facility]

## 2017-03-23 DIAGNOSIS — E669 Obesity, unspecified: Secondary | ICD-10-CM | POA: Diagnosis not present

## 2017-03-23 DIAGNOSIS — I890 Lymphedema, not elsewhere classified: Secondary | ICD-10-CM | POA: Diagnosis not present

## 2017-03-23 DIAGNOSIS — R600 Localized edema: Secondary | ICD-10-CM | POA: Diagnosis not present

## 2017-03-23 DIAGNOSIS — N182 Chronic kidney disease, stage 2 (mild): Secondary | ICD-10-CM | POA: Diagnosis not present

## 2017-03-23 DIAGNOSIS — I10 Essential (primary) hypertension: Secondary | ICD-10-CM | POA: Diagnosis not present

## 2017-03-24 ENCOUNTER — Ambulatory Visit (INDEPENDENT_AMBULATORY_CARE_PROVIDER_SITE_OTHER): Payer: PPO | Admitting: Pulmonary Disease

## 2017-03-24 ENCOUNTER — Encounter: Payer: Self-pay | Admitting: Pulmonary Disease

## 2017-03-24 VITALS — BP 144/82 | HR 70 | Ht 65.5 in | Wt 242.0 lb

## 2017-03-24 DIAGNOSIS — R0602 Shortness of breath: Secondary | ICD-10-CM

## 2017-03-24 DIAGNOSIS — R059 Cough, unspecified: Secondary | ICD-10-CM

## 2017-03-24 DIAGNOSIS — R05 Cough: Secondary | ICD-10-CM

## 2017-03-24 LAB — PULMONARY FUNCTION TEST
DL/VA % PRED: 102 %
DL/VA: 5.09 ml/min/mmHg/L
DLCO COR: 18.59 ml/min/mmHg
DLCO UNC % PRED: 70 %
DLCO UNC: 18.59 ml/min/mmHg
DLCO cor % pred: 70 %
FEF 25-75 POST: 2.49 L/s
FEF 25-75 PRE: 2.34 L/s
FEF2575-%CHANGE-POST: 6 %
FEF2575-%PRED-PRE: 118 %
FEF2575-%Pred-Post: 125 %
FEV1-%Change-Post: 1 %
FEV1-%PRED-POST: 92 %
FEV1-%PRED-PRE: 90 %
FEV1-POST: 1.94 L
FEV1-Pre: 1.91 L
FEV1FVC-%CHANGE-POST: 4 %
FEV1FVC-%PRED-PRE: 107 %
FEV6-%Change-Post: -2 %
FEV6-%PRED-POST: 85 %
FEV6-%Pred-Pre: 87 %
FEV6-PRE: 2.27 L
FEV6-Post: 2.21 L
FEV6FVC-%PRED-PRE: 104 %
FEV6FVC-%Pred-Post: 104 %
FVC-%Change-Post: -2 %
FVC-%Pred-Post: 82 %
FVC-%Pred-Pre: 84 %
FVC-Post: 2.21 L
FVC-Pre: 2.27 L
POST FEV1/FVC RATIO: 88 %
PRE FEV1/FVC RATIO: 84 %
Post FEV6/FVC ratio: 100 %
Pre FEV6/FVC Ratio: 100 %
RV % pred: 77 %
RV: 1.67 L
TLC % pred: 85 %
TLC: 4.51 L

## 2017-03-24 NOTE — Progress Notes (Signed)
PFT done today. 

## 2017-03-24 NOTE — Patient Instructions (Signed)
I reviewed the pulmonary function tests with you. There is no clear evidence of asthma or COPD Continue using the albuterol inhaler as needed Follow-up in 6 months

## 2017-03-24 NOTE — Progress Notes (Signed)
Emily Wood    [id number]    [birth year]  Primary Care Physician:Copland, Gay Filler, MD  Referring Physician: Darreld Mclean, [address] [place], [place] [address]  Chief complaint:  Follow-up for cough, bronchitis  HPI: 66 year old with history of arthritis, diabetes, hypertension. She had an episode of bronchitis present illness low-grade fever and cough in February 2018. She was treated with Levaquin and cough syrup but and has a persistent cough since then, nonproductive in nature associated with dyspnea on exertion. No wheezing. She was treated with Qvar and albuterol with minimal improvement in symptoms. At last visit with Dr. Lorelei Pont she was given prednisone and similar. She's been on prednisone for 4 days and reports improving symptoms.. She has stopped Qvar while on the prednisone.  She does not report seasonal allergies, rhinitis, postnasal drip. She does not report GERD, acid reflux. She is on ARB for blood pressure medication but has been on this for many years without any issues. She is also on gabapentin for meralgia paraesthetica  Pets: None  Occupation: Therapist, art for [employer] Exposures: No known exposures Smoking history: None  Interim history: Reports improvement in cough, dyspnea. She stopped using the Qvar inhaler. No new complaints today.  Outpatient Encounter Prescriptions as of 03/24/2017  Medication Sig  . Alpha-Lipoic Acid 300 MG CAPS Take 2 capsules by mouth daily.  Marland Kitchen aspirin 81 MG tablet Take 81 mg by mouth daily.  . cloNIDine (CATAPRES) 0.1 MG tablet Take 0.1 mg by mouth 3 (three) times daily.  Marland Kitchen gabapentin (NEURONTIN) 400 MG capsule Take 2 capsules by mouth 3 (three) times daily.   . metFORMIN (GLUCOPHAGE) 500 MG tablet Take 1 tablet (500 mg total) by mouth 2 (two) times daily with a meal.  . metoprolol (LOPRESSOR) 50 MG tablet Take 1 tablet (50 mg total) by mouth 2 (two) times daily.  .  montelukast (SINGULAIR) 10 MG tablet Take 1 tablet (10 mg total) by mouth at bedtime.  Marland Kitchen OVER THE COUNTER MEDICATION Take 1 capsule by mouth daily. ARTHROCEN 300 mg   . potassium chloride SA (K-DUR,KLOR-CON) 20 MEQ tablet Take 1 tablet (20 mEq total) by mouth daily.  . valsartan-hydrochlorothiazide (DIOVAN-HCT) 320-25 MG tablet   . Albuterol Sulfate (PROAIR RESPICLICK) 932 (90 Base) MCG/ACT AEPB Inhale 2 puffs into the lungs every 6 (six) hours as needed. (Patient not taking: Reported on 03/24/2017)  . beclomethasone (QVAR) 40 MCG/ACT inhaler Inhale 2 puffs into the lungs 2 (two) times daily. (Patient not taking: Reported on 03/24/2017)   No facility-administered encounter medications on file as of 03/24/2017.     Allergies as of 03/24/2017  . (No Known Allergies)    Past Medical History:  Diagnosis Date  . Arthritis   . Diabetes mellitus without complication ([place])   . Hypertension     Past Surgical History:  Procedure Laterality Date  . CHOLECYSTECTOMY    . HERNIA REPAIR    . OTHER SURGICAL HISTORY     scar tissue removal from bowels  . PARTIAL HYSTERECTOMY     Still has ovaries  . UTERINE FIBROID SURGERY      Family History  Problem Relation Age of Onset  . Hyperlipidemia Mother   . Hyperlipidemia Maternal Aunt     Social History   Social History  . Marital status: Married    Spouse name: [name]  . Number of children: 0  . Years of education: 64  Occupational History  . customer service    Social History Main Topics  . Smoking status: Never Smoker  . Smokeless tobacco: Never Used  . Alcohol use No  . Drug use: No  . Sexual activity: Not Currently    Birth control/ protection: Post-menopausal   Other Topics Concern  . Not on file   Social History Narrative   Lives with husband   Caffeine use: 16oz coffee per day    Review of systems: Review of Systems  Constitutional: Negative for fever and chills.  HENT: Negative.   Eyes: Negative for blurred vision.    Respiratory: as per HPI  Cardiovascular: Negative for chest pain and palpitations.  Gastrointestinal: Negative for vomiting, diarrhea, blood per rectum. Genitourinary: Negative for dysuria, urgency, frequency and hematuria.  Musculoskeletal: Negative for myalgias, back pain and joint pain.  Skin: Negative for itching and rash.  Neurological: Negative for dizziness, tremors, focal weakness, seizures and loss of consciousness.  Endo/Heme/Allergies: Negative for environmental allergies.  Psychiatric/Behavioral: Negative for depression, suicidal ideas and hallucinations.  All other systems reviewed and are negative.  Physical Exam: Blood pressure (!) 160/90, pulse 66, height 5' 5.5" (1.664 m), weight 235 lb 3.2 oz (106.7 kg), SpO2 99 %. Gen:      No acute distress HEENT:  EOMI, sclera anicteric Neck:     No masses; no thyromegaly Lungs:    Clear to auscultation bilaterally; normal respiratory effort CV:         Regular rate and rhythm; no murmurs Abd:      + bowel sounds; soft, non-tender; no palpable masses, no distension Ext:    No edema; adequate peripheral perfusion Skin:      Warm and dry; no rash Neuro: alert and oriented x 3 Psych: normal mood and affect  Data Reviewed: QuantiFERON 12/16/16-negative  Chest x-ray 12/16/16-mild chronic bronchitis. I have reviewed images personally.  Echo 09/16/16 - Left ventricle: The cavity size was normal. Wall thickness was   increased in a pattern of moderate LVH. Systolic function was   normal. The estimated ejection fraction was in the range of 60%   to 65%. Wall motion was normal; there were no regional wall   motion abnormalities. Features are consistent with a pseudonormal   left ventricular filling pattern, with concomitant abnormal   relaxation and increased filling pressure (grade 2 diastolic   dysfunction). - Mitral valve: There was mild regurgitation. - Right atrium: The atrium was mildly dilated. - Pulmonary arteries: Systolic  pressure was moderately increased.   PA peak pressure: 52 mm Hg (S).  FENO 12/22/16- 31  PFTs 9/5/1 8 FVC 2.21 (82%], FEV1 1.94 (92%], F/F 88, POC 85%, DLCO 70% Mild diffusion defect  Assessment:  Follow-up for cough. She likely has persistent postinflammatory cough after an episode of bronchitis in February 2018. Her FENO was slightly elevated in the office today indicating ongoing airway inflammation. She has responded to the prednisone. She required Qvar briefly but is now off it and does not need any standing inhalers and will continue on albuterol as needed. She wants to avoid any additional medications at this point.  PFTs reviewed. There is no obstruction or bronchodilator response. There is mild restriction which corrects for alveolar volume. I don't believe she has any interstitial lung disease.  Plan/Recommendations: - Continue albuterol as needed  Marshell Garfinkel MD  Pulmonary and Critical Care Pager [phone] 03/24/2017, 4:17 PM  CC: Copland, Gay Filler, MD

## 2017-04-02 DIAGNOSIS — M1711 Unilateral primary osteoarthritis, right knee: Secondary | ICD-10-CM | POA: Diagnosis not present

## 2017-04-09 DIAGNOSIS — M1711 Unilateral primary osteoarthritis, right knee: Secondary | ICD-10-CM | POA: Diagnosis not present

## 2017-04-16 DIAGNOSIS — M1711 Unilateral primary osteoarthritis, right knee: Secondary | ICD-10-CM | POA: Diagnosis not present

## 2017-04-30 DIAGNOSIS — I10 Essential (primary) hypertension: Secondary | ICD-10-CM | POA: Diagnosis not present

## 2017-04-30 DIAGNOSIS — R600 Localized edema: Secondary | ICD-10-CM | POA: Diagnosis not present

## 2017-04-30 DIAGNOSIS — E119 Type 2 diabetes mellitus without complications: Secondary | ICD-10-CM | POA: Diagnosis not present

## 2017-04-30 DIAGNOSIS — E1122 Type 2 diabetes mellitus with diabetic chronic kidney disease: Secondary | ICD-10-CM | POA: Diagnosis not present

## 2017-04-30 DIAGNOSIS — E1129 Type 2 diabetes mellitus with other diabetic kidney complication: Secondary | ICD-10-CM | POA: Diagnosis not present

## 2017-05-07 DIAGNOSIS — M7062 Trochanteric bursitis, left hip: Secondary | ICD-10-CM | POA: Diagnosis not present

## 2017-05-24 ENCOUNTER — Ambulatory Visit: Payer: PPO | Admitting: Family Medicine

## 2017-05-24 ENCOUNTER — Encounter: Payer: Self-pay | Admitting: Family Medicine

## 2017-05-24 VITALS — BP 145/90 | HR 76 | Temp 98.4°F | Resp 16 | Wt 233.0 lb

## 2017-05-24 DIAGNOSIS — R05 Cough: Secondary | ICD-10-CM | POA: Diagnosis not present

## 2017-05-24 DIAGNOSIS — J020 Streptococcal pharyngitis: Secondary | ICD-10-CM | POA: Diagnosis not present

## 2017-05-24 DIAGNOSIS — R52 Pain, unspecified: Secondary | ICD-10-CM | POA: Diagnosis not present

## 2017-05-24 DIAGNOSIS — J029 Acute pharyngitis, unspecified: Secondary | ICD-10-CM

## 2017-05-24 DIAGNOSIS — R059 Cough, unspecified: Secondary | ICD-10-CM

## 2017-05-24 LAB — POCT INFLUENZA A/B
Influenza A, POC: NEGATIVE
Influenza B, POC: NEGATIVE

## 2017-05-24 LAB — POCT RAPID STREP A (OFFICE): Rapid Strep A Screen: POSITIVE — AB

## 2017-05-24 MED ORDER — PENICILLIN V POTASSIUM 500 MG PO TABS: 500.0000 mg | ORAL_TABLET | Freq: Two times a day (BID) | ORAL | 0 refills | Status: DC

## 2017-05-24 MED ORDER — HYDROCODONE-HOMATROPINE 5-1.5 MG/5ML PO SYRP: 5.0000 mL | ORAL_SOLUTION | Freq: Three times a day (TID) | ORAL | 0 refills | Status: DC | PRN

## 2017-05-24 NOTE — Progress Notes (Signed)
[facility] [facility] [address], [place], [place] [address] [phone]  Date:  05/24/2017   Name:  Emily Wood   DOB:  10-30-1950   MRN:  [id number]  PCP:  Darreld Mclean, MD    Chief Complaint: Cough (Pt reports body aches weakness headache )   History of Present Illness:  Emily Wood is a 66 y.o. very pleasant female patient who presents with the following:  Here today with illness- she has noted ST since this past Friday (today is Monday).  She also has body aches, weakness, exhaustion, headache. No earache noted She does have some cough No vomiting, she did a bit of of soft, loose stool  She is not aware of and fever, but felt chilled and hurting all over  She has tried zicam, which did not help   History of HTN, CKD  Patient Active Problem List   Diagnosis Date Noted  . Lymphedema 01/25/2017  . Fibroid uterus 01/14/2017  . Hypertensive heart disease without heart failure 10/01/2016  . Chronic kidney disease, stage 3 ([place]) 09/21/2016  . Meralgia paresthetica of left side 08/27/2015  . Diabetes mellitus type 2, controlled ([place]) 08/02/2015  . Chronic venous insufficiency 06/28/2015  . Essential hypertension 02/15/2015  . Peripheral edema 02/15/2015  . Chronic knee pain 02/15/2015    Past Medical History:  Diagnosis Date  . Arthritis   . Diabetes mellitus without complication ([place])   . Hypertension     Past Surgical History:  Procedure Laterality Date  . CHOLECYSTECTOMY    . HERNIA REPAIR    . OTHER SURGICAL HISTORY     scar tissue removal from bowels  . PARTIAL HYSTERECTOMY     Still has ovaries  . UTERINE FIBROID SURGERY      Social History   Tobacco Use  . Smoking status: Never Smoker  . Smokeless tobacco: Never Used  Substance Use Topics  . Alcohol use: No    Alcohol/week: 0.0 oz  . Drug use: No    Family History  Problem Relation Age of Onset  . Hyperlipidemia Mother    . Hyperlipidemia Maternal Aunt     No Known Allergies  Medication list has been reviewed and updated.  Current Outpatient Medications on File Prior to Visit  Medication Sig Dispense Refill  . Albuterol Sulfate (PROAIR RESPICLICK) 160 (90 Base) MCG/ACT AEPB Inhale 2 puffs into the lungs every 6 (six) hours as needed. 1 each 3  . Alpha-Lipoic Acid 300 MG CAPS Take 2 capsules by mouth daily.    Marland Kitchen aspirin 81 MG tablet Take 81 mg by mouth daily.    . beclomethasone (QVAR) 40 MCG/ACT inhaler Inhale 2 puffs into the lungs 2 (two) times daily. 1 Inhaler 6  . cloNIDine (CATAPRES) 0.1 MG tablet Take 0.1 mg by mouth 3 (three) times daily.    Marland Kitchen gabapentin (NEURONTIN) 400 MG capsule Take 2 capsules by mouth 3 (three) times daily.   0  . metFORMIN (GLUCOPHAGE) 500 MG tablet Take 1 tablet (500 mg total) by mouth 2 (two) times daily with a meal. 180 tablet 2  . metoprolol (LOPRESSOR) 50 MG tablet Take 1 tablet (50 mg total) by mouth 2 (two) times daily. 180 tablet 3  . OVER THE COUNTER MEDICATION Take 1 capsule by mouth daily. ARTHROCEN 300 mg     . potassium chloride SA (K-DUR,KLOR-CON) 20 MEQ tablet Take 1 tablet (20 mEq total) by mouth daily. V[place]  tablet 3  . valsartan-hydrochlorothiazide (DIOVAN-HCT) 320-25 MG tablet   0  . montelukast (SINGULAIR) 10 MG tablet Take 1 tablet (10 mg total) by mouth at bedtime. (Patient not taking: Reported on 05/24/2017) 30 tablet 3   No current facility-administered medications on file prior to visit.     Review of Systems:  As per HPI- otherwise negative. She is seeing endocrinology tomorrow about a possible thyroid abnl No rash    Physical Examination: Vitals:   05/24/17 1349  BP: (!) 172/87  Pulse: 76  Resp: 16  Temp: 98.4 F (36.9 C)  SpO2: 100%   Vitals:   05/24/17 1349  Weight: 233 lb (105.7 kg)   Body mass index is 38.18 kg/m. Ideal Body Weight:    GEN: WDWN, NAD, Non-toxic, A & O x 3, obese, appears well but tired today HEENT: Atraumatic,  Normocephalic. Neck supple. No masses, No LAD.  Bilateral TM wnl, oropharynx normal.  PEERL,EOMI.   Ears and Nose: No external deformity. CV: RRR, No M/G/R. No JVD. No thrill. No extra heart sounds. PULM: CTA B, no wheezes, crackles, rhonchi. No retractions. No resp. distress. No accessory muscle use. EXTR: No c/c/e NEURO Normal gait.  PSYCH: Normally interactive. Conversant. Not depressed or anxious appearing.  Calm demeanor.   Results for orders placed or performed in visit on 05/24/17  POCT rapid strep A  Result Value Ref Range   Rapid Strep A Screen Positive (A) Negative  POCT Influenza A/B  Result Value Ref Range   Influenza A, POC Negative Negative   Influenza B, POC Negative Negative    Assessment and Plan: Strep pharyngitis - Plan: penicillin v potassium (VEETID) 500 MG tablet  Sore throat - Plan: POCT rapid strep A  Body aches - Plan: POCT Influenza A/B  Cough - Plan: HYDROcodone-homatropine (HYCODAN) 5-1.5 MG/5ML syrup  Strep throat today- treat with pencillin vk, hycodan to use for cough She will rest, hydrate, control fevers To let me know if not feeling better in the next 2 days- Sooner if worse.     Signed [name], MD

## 2017-05-24 NOTE — Patient Instructions (Signed)
You have strep throat!  Use the antibiotic as needed, tylenol as needed for fever and chills Cough syrup as needed

## 2017-05-25 DIAGNOSIS — R7989 Other specified abnormal findings of blood chemistry: Secondary | ICD-10-CM | POA: Diagnosis not present

## 2017-05-25 DIAGNOSIS — E0590 Thyrotoxicosis, unspecified without thyrotoxic crisis or storm: Secondary | ICD-10-CM | POA: Diagnosis not present

## 2017-05-27 ENCOUNTER — Encounter: Payer: Self-pay | Admitting: Internal Medicine

## 2017-05-27 ENCOUNTER — Ambulatory Visit: Payer: PPO | Admitting: Internal Medicine

## 2017-05-27 VITALS — BP 136/80 | HR 68 | Temp 98.0°F | Resp 14 | Ht 65.5 in | Wt 233.0 lb

## 2017-05-27 DIAGNOSIS — J020 Streptococcal pharyngitis: Secondary | ICD-10-CM | POA: Diagnosis not present

## 2017-05-27 DIAGNOSIS — J40 Bronchitis, not specified as acute or chronic: Secondary | ICD-10-CM

## 2017-05-27 MED ORDER — AZITHROMYCIN 250 MG PO TABS
ORAL_TABLET | ORAL | 0 refills | Status: DC
Start: 2017-05-27 — End: 2017-06-17

## 2017-05-27 NOTE — Progress Notes (Signed)
Subjective:    Patient ID: Emily Wood, female    DOB: 1951-06-12, 66 y.o.   MRN: [id number]  DOS:  05/27/2017 Type of visit - description : acute Interval history: Patient was seen by PCP 3 days ago with cough, ST, aches, headaches, weakness, a rapid strep test was positive, influenza test negative, prescribed penicillin and hydrocodone. She is here because she is still feeling weak and tired. Cough has decreased a little, sore throat is better.   Review of Systems Denies fever chills No difficulty swallowing No nausea, vomiting.  Stools are slightly loose No chest pain or difficulty breathing but she admits to some wheezing.  Past Medical History:  Diagnosis Date  . Arthritis   . Diabetes mellitus without complication ([place])   . Hypertension     Past Surgical History:  Procedure Laterality Date  . CHOLECYSTECTOMY    . HERNIA REPAIR    . OTHER SURGICAL HISTORY     scar tissue removal from bowels  . PARTIAL HYSTERECTOMY     Still has ovaries  . UTERINE FIBROID SURGERY      Social History   Socioeconomic History  . Marital status: Married    Spouse name: [name]  . Number of children: 0  . Years of education: 68  . Highest education level: Not on file  Social Needs  . Financial resource strain: Not on file  . Food insecurity - worry: Not on file  . Food insecurity - inability: Not on file  . Transportation needs - medical: Not on file  . Transportation needs - non-medical: Not on file  Occupational History  . Occupation: customer service  Tobacco Use  . Smoking status: Never Smoker  . Smokeless tobacco: Never Used  Substance and Sexual Activity  . Alcohol use: No    Alcohol/week: 0.0 oz  . Drug use: No  . Sexual activity: Not Currently    Birth control/protection: Post-menopausal  Other Topics Concern  . Not on file  Social History Narrative   Lives with husband   Caffeine use: 16oz coffee per day      Allergies as of 05/27/2017   No Known  Allergies     Medication List        Accurate as of 05/27/17 12:58 PM. Always use your most recent med list.          Albuterol Sulfate 108 (90 Base) MCG/ACT Aepb Commonly known as:  PROAIR RESPICLICK Inhale 2 puffs into the lungs every 6 (six) hours as needed.   Alpha-Lipoic Acid 300 MG Caps Take 2 capsules by mouth daily.   aspirin 81 MG tablet Take 81 mg by mouth daily.   azithromycin 250 MG tablet Commonly known as:  ZITHROMAX Z-PAK 2 tabs a day the first day, then 1 tab a day x 4 days   beclomethasone 40 MCG/ACT inhaler Commonly known as:  QVAR Inhale 2 puffs into the lungs 2 (two) times daily.   cloNIDine 0.1 MG tablet Commonly known as:  CATAPRES Take 0.1 mg by mouth 3 (three) times daily.   gabapentin 400 MG capsule Commonly known as:  NEURONTIN Take 2 capsules by mouth 3 (three) times daily.   HYDROcodone-homatropine 5-1.5 MG/5ML syrup Commonly known as:  HYCODAN Take 5 mLs every 8 (eight) hours as needed by mouth for cough.   metFORMIN 500 MG tablet Commonly known as:  GLUCOPHAGE Take 1 tablet (500 mg total) by mouth 2 (two) times daily with a meal.   metoprolol tartrate 50  MG tablet Commonly known as:  LOPRESSOR Take 1 tablet (50 mg total) by mouth 2 (two) times daily.   montelukast 10 MG tablet Commonly known as:  SINGULAIR Take 1 tablet (10 mg total) by mouth at bedtime.   OVER THE COUNTER MEDICATION Take 1 capsule by mouth daily. ARTHROCEN 300 mg   potassium chloride SA 20 MEQ tablet Commonly known as:  K-DUR,KLOR-CON Take 1 tablet (20 mEq total) by mouth daily.   valsartan-hydrochlorothiazide 320-25 MG tablet Commonly known as:  DIOVAN-HCT          Objective:   Physical Exam BP 136/80 (BP Location: Left Arm, Patient Position: Sitting, Cuff Size: Normal)   Pulse 68   Temp 98 F (36.7 C) (Oral)   Resp 14   Ht 5' 5.5" (1.664 m)   Wt 233 lb (105.7 kg)   SpO2 96%   BMI 38.18 kg/m  General:   Well developed, well nourished . NAD.   HEENT:  Normocephalic . Face symmetric, atraumatic. Throat symmetric, no red, no d/c  TMs wnl Nose congested, sinus not TTO Lungs:  + ronchi and large airway congestion B, mild. Increase expiratory time, no wheezing Normal respiratory effort, no intercostal retractions, no accessory muscle use. Heart: RRR,  no murmur.  No pretibial edema bilaterally  Skin: Not pale. Not jaundice Neurologic:  alert & oriented X3.  Speech normal, gait appropriate for age and unassisted Psych--  Cognition and judgment appear intact.  Cooperative with normal attention span and concentration.  Behavior appropriate. No anxious or depressed appearing.      Assessment & Plan:    66 year old lady with history of HTN, DM, presents with: Strep throat, bronchitis: Patient has documented strep infection, on penicillin, feeling slightly better, no obvious strep throat complications.  She also has evidence of bronchitis and possibly bronchospasm.  For that reason we will change penicillin to Zithromax. Also recommend to go back on Qvar and use albuterol as needed. She reports she is very tired, does not look toxic, vital signs are stable. Observation.  DM: Reports that her A1c was 6.2 the last time she was check at nephrology

## 2017-05-27 NOTE — Progress Notes (Signed)
Pre visit review using our clinic review tool, if applicable. No additional management support is needed unless otherwise documented below in the visit note. 

## 2017-05-27 NOTE — Patient Instructions (Addendum)
Rest, fluids , tylenol  For cough:  Take Mucinex DM twice a day as needed until better  For chest congestion and wheezing: Go back on Qvar daily  for the next 2-3 weeks Use albuterol as needed  if you hear a lot of wheezing, chest congestion or you have persisting cough   For nasal congestion: Use   Flonase : 2 nasal sprays on each side of the nose in the morning until you feel better   Stop penicillin, start Zithromax, a new antibiotic   Call if not gradually better over the next  10 days  Call anytime if the symptoms are severe

## 2017-05-31 DIAGNOSIS — M1711 Unilateral primary osteoarthritis, right knee: Secondary | ICD-10-CM | POA: Diagnosis not present

## 2017-06-12 ENCOUNTER — Other Ambulatory Visit: Payer: Self-pay | Admitting: Family Medicine

## 2017-06-12 DIAGNOSIS — I10 Essential (primary) hypertension: Secondary | ICD-10-CM

## 2017-06-14 ENCOUNTER — Telehealth: Payer: Self-pay | Admitting: Family Medicine

## 2017-06-14 NOTE — Telephone Encounter (Signed)
Ointment has been scheduled for 06/17/17 with Dr. Lorelei Pont.

## 2017-06-14 NOTE — Telephone Encounter (Signed)
Copied from [place]. Topic: Quick Communication - See Telephone Encounter >> Jun 14, 2017  2:29 PM Antonieta Iba C wrote: CRM for notification. See Telephone encounter for:  06/14/17.   Apt FYI, Pt called in to schedule an apt. Pt says that she has had a sore throat and a bad cough for a week. Pt says that due to sore throat she has been experiencing some trouble swallowing.

## 2017-06-16 NOTE — Progress Notes (Signed)
[place] at [facility] [address] [place], [place] [address] [phone]  Date:  06/17/2017   Name:  Emily Wood   DOB:  October 29, 1950   MRN:  [id number]  PCP:  Darreld Mclean, MD    Chief Complaint: Cough (c/o dry cough x 2 weeks )   History of Present Illness:  Emily Wood is a 66 y.o. very pleasant female patient who presents with the following:  She was in twice earlier this month with strep throat and bronchitis Treated for strep on 11/5 with penicillin and hycodan for cough She was then here on 11/8- changed abx to azithromycin in case of bronchitis She felt like she was getting better, but she has continued to cough.  She has felt like her metformin was getting stuck in her throat over the last 10- 14 days.  She feels like the left side of her throat is swollen and sore No fever noted  Her cough is better but not yet gone  She will have some cough attacks esp with talking No GI symptoms  She reports that she had her A1c and potassium checked recently per Dr. Merita Norton- I do not have these results but she feels certain that they looked ok.  Needs refill of her K and metoprolol today  Lab Results  Component Value Date   HGBA1C 7.2 (H) 11/26/2016   She is overall feeling better No body aches or chills any longer No GI symptoms No rash   Patient Active Problem List   Diagnosis Date Noted  . Lymphedema 01/25/2017  . Fibroid uterus 01/14/2017  . Hypertensive heart disease without heart failure 10/01/2016  . Chronic kidney disease, stage 3 ([place]) 09/21/2016  . Meralgia paresthetica of left side 08/27/2015  . Diabetes mellitus type 2, controlled ([place]) 08/02/2015  . Chronic venous insufficiency 06/28/2015  . Essential hypertension 02/15/2015  . Peripheral edema 02/15/2015  . Chronic knee pain 02/15/2015    Past Medical History:  Diagnosis Date  . Arthritis   . Diabetes mellitus without complication  ([place])   . Hypertension     Past Surgical History:  Procedure Laterality Date  . CHOLECYSTECTOMY    . HERNIA REPAIR    . OTHER SURGICAL HISTORY     scar tissue removal from bowels  . PARTIAL HYSTERECTOMY     Still has ovaries  . UTERINE FIBROID SURGERY      Social History   Tobacco Use  . Smoking status: Never Smoker  . Smokeless tobacco: Never Used  Substance Use Topics  . Alcohol use: No    Alcohol/week: 0.0 oz  . Drug use: No    Family History  Problem Relation Age of Onset  . Hyperlipidemia Mother   . Hyperlipidemia Maternal Aunt     No Known Allergies  Medication list has been reviewed and updated.  Current Outpatient Medications on File Prior to Visit  Medication Sig Dispense Refill  . Albuterol Sulfate (PROAIR RESPICLICK) 268 (90 Base) MCG/ACT AEPB Inhale 2 puffs into the lungs every 6 (six) hours as needed. 1 each 3  . Alpha-Lipoic Acid 300 MG CAPS Take 2 capsules by mouth daily.    Marland Kitchen aspirin 81 MG tablet Take 81 mg by mouth daily.    . beclomethasone (QVAR) 40 MCG/ACT inhaler Inhale 2 puffs into the lungs 2 (two) times daily. 1 Inhaler 6  . cloNIDine (CATAPRES) 0.1 MG tablet Take 0.1 mg by mouth 3 (three) times  daily.    . gabapentin (NEURONTIN) 400 MG capsule Take 2 capsules by mouth 3 (three) times daily.   0  . HYDROcodone-homatropine (HYCODAN) 5-1.5 MG/5ML syrup Take 5 mLs every 8 (eight) hours as needed by mouth for cough. 90 mL 0  . metFORMIN (GLUCOPHAGE) 500 MG tablet Take 1 tablet (500 mg total) by mouth 2 (two) times daily with a meal. 180 tablet 2  . methimazole (TAPAZOLE) 5 MG tablet Take 1 tablet by mouth daily.    . metoprolol tartrate (LOPRESSOR) 50 MG tablet TAKE ONE TABLET BY MOUTH TWICE DAILY 180 tablet 3  . montelukast (SINGULAIR) 10 MG tablet Take 1 tablet (10 mg total) by mouth at bedtime. 30 tablet 3  . OVER THE COUNTER MEDICATION Take 1 capsule by mouth daily. ARTHROCEN 300 mg     . potassium chloride SA (K-DUR,KLOR-CON) 20 MEQ tablet  Take 1 tablet (20 mEq total) by mouth daily. 90 tablet 3  . valsartan-hydrochlorothiazide (DIOVAN-HCT) 320-25 MG tablet   0   No current facility-administered medications on file prior to visit.     Review of Systems:  As per HPI- otherwise negative.   Physical Examination: Vitals:   06/17/17 1734  BP: (!) 152/88  Pulse: 75  Temp: 97.9 F (36.6 C)  SpO2: 99%   Vitals:   06/17/17 1734  Weight: 251 lb 6.4 oz (114 kg)  Height: 5' 5.5" (1.664 m)   Body mass index is 41.2 kg/m. Ideal Body Weight: Weight in (lb) to have BMI = 25: 152.2  GEN: WDWN, NAD, Non-toxic, A & O x 3, obese, otherwise looks well HEENT: Atraumatic, Normocephalic. Neck supple. No masses, No LAD.  Bilateral TM wnl, oropharynx normal.  PEERL,EOMI.   Ears and Nose: No external deformity. CV: RRR, No M/G/R. No JVD. No thrill. No extra heart sounds. PULM: CTA B, no wheezes, crackles, rhonchi. No retractions. No resp. distress. No accessory muscle use. EXTR: No c/c/e NEURO Normal gait.  PSYCH: Normally interactive. Conversant. Not depressed or anxious appearing.  Calm demeanor. '  Results for orders placed or performed in visit on 06/17/17  POCT rapid strep A  Result Value Ref Range   Rapid Strep A Screen Negative Negative    Assessment and Plan: Sore throat - Plan: POCT rapid strep A  Essential hypertension - Plan: metoprolol tartrate (LOPRESSOR) 50 MG tablet  Peripheral edema - Plan: potassium chloride SA (K-DUR,KLOR-CON) 20 MEQ tablet, CANCELED: Basic metabolic panel  Medication monitoring encounter - Plan: CANCELED: Basic metabolic panel  Controlled type 2 diabetes mellitus without complication, without long-term current use of insulin (HCC) - Plan: CANCELED: Hemoglobin A1c  Immunization due - Plan: Flu vaccine HIGH DOSE PF (Fluzone High dose)  Here today with complaint of a sore throat and dry cough She has been treated with pcn and then azithromycin this month Her throat appears quite normal  today- suspect her sx are due to recent illness and cough.  However, if sx persist on Monday she will call me and I can refer her to ENT She will continue supportive care at home and let me know if not doing better soon, seek care urgently as needed if worse   Signed [name], MD

## 2017-06-17 ENCOUNTER — Encounter: Payer: Self-pay | Admitting: Family Medicine

## 2017-06-17 ENCOUNTER — Ambulatory Visit: Payer: PPO | Admitting: Family Medicine

## 2017-06-17 VITALS — BP 140/90 | HR 75 | Temp 97.9°F | Ht 65.5 in | Wt 251.4 lb

## 2017-06-17 DIAGNOSIS — E119 Type 2 diabetes mellitus without complications: Secondary | ICD-10-CM

## 2017-06-17 DIAGNOSIS — J029 Acute pharyngitis, unspecified: Secondary | ICD-10-CM

## 2017-06-17 DIAGNOSIS — I10 Essential (primary) hypertension: Secondary | ICD-10-CM | POA: Diagnosis not present

## 2017-06-17 DIAGNOSIS — Z5181 Encounter for therapeutic drug level monitoring: Secondary | ICD-10-CM | POA: Diagnosis not present

## 2017-06-17 DIAGNOSIS — R609 Edema, unspecified: Secondary | ICD-10-CM | POA: Diagnosis not present

## 2017-06-17 DIAGNOSIS — Z23 Encounter for immunization: Secondary | ICD-10-CM | POA: Diagnosis not present

## 2017-06-17 LAB — POCT RAPID STREP A (OFFICE): Rapid Strep A Screen: NEGATIVE

## 2017-06-17 MED ORDER — POTASSIUM CHLORIDE CRYS ER 20 MEQ PO TBCR: 20.0000 meq | EXTENDED_RELEASE_TABLET | Freq: Every day | ORAL | 3 refills | Status: DC

## 2017-06-17 MED ORDER — METOPROLOL TARTRATE 50 MG PO TABS: 50.0000 mg | ORAL_TABLET | Freq: Two times a day (BID) | ORAL | 3 refills | Status: DC

## 2017-06-17 NOTE — Patient Instructions (Signed)
Strep is negative today, and your throat looks good

## 2017-07-29 ENCOUNTER — Ambulatory Visit (INDEPENDENT_AMBULATORY_CARE_PROVIDER_SITE_OTHER): Payer: PPO | Admitting: Vascular Surgery

## 2017-07-29 ENCOUNTER — Encounter (INDEPENDENT_AMBULATORY_CARE_PROVIDER_SITE_OTHER): Payer: Self-pay | Admitting: Vascular Surgery

## 2017-07-29 VITALS — BP 239/109 | HR 62 | Resp 17 | Ht 65.5 in | Wt 250.0 lb

## 2017-07-29 DIAGNOSIS — I872 Venous insufficiency (chronic) (peripheral): Secondary | ICD-10-CM

## 2017-07-29 DIAGNOSIS — I890 Lymphedema, not elsewhere classified: Secondary | ICD-10-CM | POA: Diagnosis not present

## 2017-07-29 DIAGNOSIS — I10 Essential (primary) hypertension: Secondary | ICD-10-CM | POA: Diagnosis not present

## 2017-07-29 DIAGNOSIS — E119 Type 2 diabetes mellitus without complications: Secondary | ICD-10-CM | POA: Diagnosis not present

## 2017-07-29 NOTE — Progress Notes (Signed)
MRN : [id number]  Emily Wood is a 67 y.o. (October 28, 1950) female who presents with chief complaint of  Chief Complaint  Patient presents with  . Follow-up    6 month f/u  .  History of Present Illness:The patient returns to the office for followup evaluation regarding leg swelling.  The swelling has persisted but with the lymph pump is much, much better controlled. The pain associated with swelling is essentially eliminated. There have not been any interval development of a ulcerations or wounds.  The patient denies problems with the pump, noting it is working well and the leggings are in good condition.  Since the previous visit the patient has been wearing graduated compression stockings and using the lymph pump on a routine basis and  has noted significant improvement in the lymphedema.   Patient stated the lymph pump has been a very positive factor in her care.   Current Meds  Medication Sig  . Albuterol Sulfate (PROAIR RESPICLICK) 009 (90 Base) MCG/ACT AEPB Inhale 2 puffs into the lungs every 6 (six) hours as needed.  . Alpha-Lipoic Acid 300 MG CAPS Take 2 capsules by mouth daily.  Marland Kitchen aspirin 81 MG tablet Take 81 mg by mouth daily.  . beclomethasone (QVAR) 40 MCG/ACT inhaler Inhale 2 puffs into the lungs 2 (two) times daily.  . cloNIDine (CATAPRES) 0.1 MG tablet Take 0.1 mg by mouth 3 (three) times daily.  Marland Kitchen gabapentin (NEURONTIN) 400 MG capsule Take 2 capsules by mouth 3 (three) times daily.   . irbesartan (AVAPRO) 300 MG tablet   . metFORMIN (GLUCOPHAGE) 500 MG tablet Take 1 tablet (500 mg total) by mouth 2 (two) times daily with a meal.  . methimazole (TAPAZOLE) 5 MG tablet Take 1 tablet by mouth daily.  . metoprolol tartrate (LOPRESSOR) 50 MG tablet Take 1 tablet (50 mg total) by mouth 2 (two) times daily.  Marland Kitchen OVER THE COUNTER MEDICATION Take 1 capsule by mouth daily. ARTHROCEN 300 mg   . potassium chloride SA (K-DUR,KLOR-CON) 20 MEQ tablet Take 1 tablet (20 mEq total) by  mouth daily.    Past Medical History:  Diagnosis Date  . Arthritis   . Diabetes mellitus without complication ([place])   . Hypertension     Past Surgical History:  Procedure Laterality Date  . CHOLECYSTECTOMY    . HERNIA REPAIR    . OTHER SURGICAL HISTORY     scar tissue removal from bowels  . PARTIAL HYSTERECTOMY     Still has ovaries  . UTERINE FIBROID SURGERY      Social History Social History   Tobacco Use  . Smoking status: Never Smoker  . Smokeless tobacco: Never Used  Substance Use Topics  . Alcohol use: No    Alcohol/week: 0.0 oz  . Drug use: No    Family History Family History  Problem Relation Age of Onset  . Hyperlipidemia Mother   . Hyperlipidemia Maternal Aunt     No Known Allergies   REVIEW OF SYSTEMS (Negative unless checked)  Constitutional: [] Weight loss  [] Fever  [] Chills Cardiac: [] Chest pain   [] Chest pressure   [] Palpitations   [] Shortness of breath when laying flat   [] Shortness of breath with exertion. Vascular:  [] Pain in legs with walking   [] Pain in legs at rest  [] History of DVT   [] Phlebitis   [x] Swelling in legs   [] Varicose veins   [] Non-healing ulcers Pulmonary:   [] Uses home oxygen   [] Productive cough   [] Hemoptysis   []   Wheeze  [] COPD   [] Asthma Neurologic:  [] Dizziness   [] Seizures   [] History of stroke   [] History of TIA  [] Aphasia   [] Vissual changes   [] Weakness or numbness in arm   [] Weakness or numbness in leg Musculoskeletal:   [] Joint swelling   [] Joint pain   [] Low back pain Hematologic:  [] Easy bruising  [] Easy bleeding   [] Hypercoagulable state   [] Anemic Gastrointestinal:  [] Diarrhea   [] Vomiting  [] Gastroesophageal reflux/heartburn   [] Difficulty swallowing. Genitourinary:  [] Chronic kidney disease   [] Difficult urination  [] Frequent urination   [] Blood in urine Skin:  [] Rashes   [] Ulcers  Psychological:  [] History of anxiety   []  History of major depression.  Physical Examination  Vitals:   07/29/17 1307 07/29/17  1313  BP: (!) 217/95 (!) 239/109  Pulse: 62 62  Resp: 17   Weight: 113.4 kg (250 lb)   Height: 5' 5.5" (1.664 m)    Body mass index is 40.97 kg/m. Gen: WD/WN, NAD Head: /AT, No temporalis wasting.  Ear/Nose/Throat: Hearing grossly intact, nares w/o erythema or drainage Eyes: PER, EOMI, sclera nonicteric.  Neck: Supple, no large masses.   Pulmonary:  Good air movement, no audible wheezing bilaterally, no use of accessory muscles.  Cardiac: RRR, no JVD Vascular: scattered varicosities present bilaterally.  Mild venous stasis changes to the legs bilaterally.  2+ soft pitting edema Vessel Right Left  Radial Palpable Palpable  PT Palpable Palpable  DP Palpable Palpable  Gastrointestinal: Non-distended. No guarding/no peritoneal signs.  Musculoskeletal: M/S 5/5 throughout.  No deformity or atrophy.  Neurologic: CN 2-12 intact. Symmetrical.  Speech is fluent. Motor exam as listed above. Psychiatric: Judgment intact, Mood & affect appropriate for pt's clinical situation. Dermatologic: mild venous rashes no ulcers noted.  No changes consistent with cellulitis. Lymph : No lichenification or skin changes of chronic lymphedema.  CBC Lab Results  Component Value Date   WBC 7.5 11/26/2016   HGB 10.4 (L) 11/26/2016   HCT 31.1 (L) 11/26/2016   MCV 84.2 11/26/2016   PLT 396.0 11/26/2016    BMET    Component Value Date/Time   NA 136 12/16/2016 1548   K 4.2 12/16/2016 1548   CL 101 12/16/2016 1548   CO2 28 12/16/2016 1548   GLUCOSE 77 12/16/2016 1548   BUN 19 12/16/2016 1548   CREATININE 1.02 12/16/2016 1548   CREATININE 1.01 (H) 08/26/2015 1203   CALCIUM 9.6 12/16/2016 1548   CrCl cannot be calculated (Patient's most recent lab result is older than the maximum 21 days allowed.).  COAG No results found for: INR, PROTIME  Radiology No results found.  Assessment/Plan 1. Lymphedema  No surgery or intervention at this point in time.    I have reviewed my discussion with the  patient regarding lymphedema and why it  causes symptoms.  Patient will continue wearing graduated compression stockings class 1 (20-30 mmHg) on a daily basis a prescription was given. The patient is reminded to put the stockings on first thing in the morning and removing them in the evening. The patient is instructed specifically not to sleep in the stockings.   In addition, behavioral modification throughout the day will be continued.  This will include frequent elevation (such as in a recliner), use of over the counter pain medications as needed and exercise such as walking.  I have reviewed systemic causes for chronic edema such as liver, kidney and cardiac etiologies and there does not appear to be any significant changes in these organ  systems over the past year.  The patient is under the impression that these organ systems are all stable and unchanged.    The patient will continue aggressive use of the  lymph pump.  This will continue to improve the edema control and prevent sequela such as ulcers and infections.   The patient will follow-up with me on an annual basis.    2. Chronic venous insufficiency  No surgery or intervention at this point in time.    I have reviewed my discussion with the patient regarding lymphedema and why it  causes symptoms.  Patient will continue wearing graduated compression stockings class 1 (20-30 mmHg) on a daily basis a prescription was given. The patient is reminded to put the stockings on first thing in the morning and removing them in the evening. The patient is instructed specifically not to sleep in the stockings.   In addition, behavioral modification throughout the day will be continued.  This will include frequent elevation (such as in a recliner), use of over the counter pain medications as needed and exercise such as walking.  I have reviewed systemic causes for chronic edema such as liver, kidney and cardiac etiologies and there does not appear to  be any significant changes in these organ systems over the past year.  The patient is under the impression that these organ systems are all stable and unchanged.    The patient will continue aggressive use of the  lymph pump.  This will continue to improve the edema control and prevent sequela such as ulcers and infections.   The patient will follow-up with me on an annual basis.    3. Essential hypertension Continue antihypertensive medications as already ordered, these medications have been reviewed and there are no changes at this time.   4. Controlled type 2 diabetes mellitus without complication, without long-term current use of insulin ([place]) Continue hypoglycemic medications as already ordered, these medications have been reviewed and there are no changes at this time.  Hgb A1C to be monitored as already arranged by primary service    Hortencia Pilar, MD  07/29/2017 1:23 PM

## 2017-07-31 ENCOUNTER — Encounter (INDEPENDENT_AMBULATORY_CARE_PROVIDER_SITE_OTHER): Payer: Self-pay | Admitting: Vascular Surgery

## 2017-08-12 DIAGNOSIS — R600 Localized edema: Secondary | ICD-10-CM | POA: Diagnosis not present

## 2017-08-12 DIAGNOSIS — N183 Chronic kidney disease, stage 3 (moderate): Secondary | ICD-10-CM | POA: Diagnosis not present

## 2017-08-12 DIAGNOSIS — I10 Essential (primary) hypertension: Secondary | ICD-10-CM | POA: Diagnosis not present

## 2017-08-30 DIAGNOSIS — E0590 Thyrotoxicosis, unspecified without thyrotoxic crisis or storm: Secondary | ICD-10-CM | POA: Diagnosis not present

## 2017-08-31 DIAGNOSIS — E0500 Thyrotoxicosis with diffuse goiter without thyrotoxic crisis or storm: Secondary | ICD-10-CM | POA: Diagnosis not present

## 2017-09-15 ENCOUNTER — Ambulatory Visit: Payer: Self-pay

## 2017-09-15 NOTE — Telephone Encounter (Signed)
Pt.reports left middle finger started hurting Monday. The whole finger hurts down into the palm and palm is puffy. No injury. No redness. States finger "might be a little swollen." Encouraged OTC analgesic that she can safely take. Appointment made. Reason for Disposition . [1] MODERATE pain (e.g., interferes with normal activities) AND [2] present > 3 days  Answer Assessment - Initial Assessment Questions 1. ONSET: "When did the pain start?"      Monday 2. LOCATION and RADIATION: "Where is the pain located?"  (e.g., fingertip, around nail, joint, entire  finger)      Middle finger down to the palm 3. SEVERITY: "How bad is the pain?" "What does it keep you from doing?"   (Scale 1-10; or mild, moderate, severe)  - MILD - doesn't interfere with normal activities   - MODERATE - interferes with normal activities or awakens from sleep  - SEVERE - excruciating pain, unable to hold a glass of water or bend finger even a little     7 4. APPEARANCE: "What does the finger look like?" (e.g., redness, swelling, bruising, pallor)     Maybe a little 5. WORK OR EXERCISE: "Has there been any recent work or exercise that involved this part of the body?"     No 6. CAUSE: "What do you think is causing the pain?"     Unsure 7. AGGRAVATING FACTORS: "What makes the pain worse?" (e.g., using computer)     Any movement 8. OTHER SYMPTOMS: "Do you have any other symptoms?" (e.g., fever, neck pain, numbness)     No 9. PREGNANCY: "Is there any chance you are pregnant?" "When was your last menstrual period?"     No  Protocols used: FINGER PAIN-A-AH

## 2017-09-16 ENCOUNTER — Ambulatory Visit (INDEPENDENT_AMBULATORY_CARE_PROVIDER_SITE_OTHER): Payer: PPO | Admitting: Medical

## 2017-09-16 ENCOUNTER — Encounter: Payer: Self-pay | Admitting: Medical

## 2017-09-16 VITALS — BP 178/98 | HR 58 | Temp 97.9°F | Resp 16 | Ht 65.0 in | Wt 249.0 lb

## 2017-09-16 DIAGNOSIS — M79642 Pain in left hand: Secondary | ICD-10-CM | POA: Diagnosis not present

## 2017-09-16 DIAGNOSIS — I10 Essential (primary) hypertension: Secondary | ICD-10-CM

## 2017-09-16 DIAGNOSIS — L089 Local infection of the skin and subcutaneous tissue, unspecified: Secondary | ICD-10-CM

## 2017-09-16 DIAGNOSIS — E119 Type 2 diabetes mellitus without complications: Secondary | ICD-10-CM

## 2017-09-16 MED ORDER — METFORMIN HCL 500 MG PO TABS: 500.0000 mg | ORAL_TABLET | Freq: Two times a day (BID) | ORAL | 2 refills | Status: DC

## 2017-09-16 MED ORDER — DOXYCYCLINE HYCLATE 100 MG PO TABS: 100.0000 mg | ORAL_TABLET | Freq: Two times a day (BID) | ORAL | 0 refills | Status: DC

## 2017-09-16 NOTE — Patient Instructions (Addendum)
For your left hand pain will rx doxycyline antibiotic. By exam concern for possible infection. But we do need to get labs today to evaluate other potential diagnosis. Cmp, cbc, sed rate, and uric acid.  For hand pain pending above studies use tylenol.   Tomorrow in morning get left hand xray.  If hand worsens or changes over weekend then ED evaluation.   For htn, I want to stop duexis. Due to effect on blood pressure. Hold since can nsaids can increase bp.  Check bp at home. If bp over 150/90 then recommend increase clonidine to 0.2 mg tid. You did not want to change any bp meds today but states you would eat low salt diet. Watch yourself closely if any cardiac or neurologic signs or symptoms then ED evaluation.  Follow up on Monday or as needed

## 2017-09-16 NOTE — Progress Notes (Signed)
Subjective:    Patient ID: Emily Wood, female    DOB: Feb 24, 1951, 67 y.o.   MRN: [id number]  HPI  Pt in states on Tuesday afternoon she had some swelling in palm of her left hand mid aspect up to her 3rd finger to proximal mcp area. No trauma,no injury or any insect bite. No pain like this before. Pt states she often does carry things in that hand. But she is rt handed. Pt works Therapist, art and types a lot.  No fever, no chills or sweats.  Pt also has very high bp readings recently in January. No neurologic signs and symptoms presently. But states occasionally light headed and blurred vision but very transient and rare. Not having that presently.  She is on catpres 0.1 tid, valsartan-hctz, lopressor 50 mg  Bid. Pt states told to come off norvasc in past. Does not know why. She really does not want to take any new bp meds she tells me she just ate salty potato chips and thinks this is why bp is elevated.     Pt ortho gave her duexis for her pain(ibuprofen and famotidine)  Review of Systems  Constitutional: Negative for chills, fatigue and fever.  Respiratory: Negative for cough, chest tightness, shortness of breath and wheezing.   Cardiovascular: Negative for chest pain and palpitations.  Gastrointestinal: Negative for abdominal pain and constipation.  Musculoskeletal:       Left hand pain.  Skin: Negative for rash.  Neurological: Negative for dizziness, seizures, syncope, speech difficulty, weakness, light-headedness, numbness and headaches.  Psychiatric/Behavioral: Negative for behavioral problems, confusion, hallucinations, sleep disturbance and suicidal ideas. The patient is not nervous/anxious.     Past Medical History:  Diagnosis Date  . Arthritis   . Diabetes mellitus without complication ([place])   . Hypertension      Social History   Socioeconomic History  . Marital status: Married    Spouse name: [name]  . Number of children: 0  . Years of education: 55   . Highest education level: Not on file  Social Needs  . Financial resource strain: Not on file  . Food insecurity - worry: Not on file  . Food insecurity - inability: Not on file  . Transportation needs - medical: Not on file  . Transportation needs - non-medical: Not on file  Occupational History  . Occupation: customer service  Tobacco Use  . Smoking status: Never Smoker  . Smokeless tobacco: Never Used  Substance and Sexual Activity  . Alcohol use: No    Alcohol/week: 0.0 oz  . Drug use: No  . Sexual activity: Not Currently    Birth control/protection: Post-menopausal  Other Topics Concern  . Not on file  Social History Narrative   Lives with husband   Caffeine use: 16oz coffee per day    Past Surgical History:  Procedure Laterality Date  . CHOLECYSTECTOMY    . HERNIA REPAIR    . OTHER SURGICAL HISTORY     scar tissue removal from bowels  . PARTIAL HYSTERECTOMY     Still has ovaries  . UTERINE FIBROID SURGERY      Family History  Problem Relation Age of Onset  . Hyperlipidemia Mother   . Hyperlipidemia Maternal Aunt     No Known Allergies  Current Outpatient Medications on File Prior to Visit  Medication Sig Dispense Refill  . Albuterol Sulfate (PROAIR RESPICLICK) 102 (90 Base) MCG/ACT AEPB Inhale 2 puffs into the lungs every 6 (six) hours as needed. 1  each 3  . Alpha-Lipoic Acid 300 MG CAPS Take 2 capsules by mouth daily.    Marland Kitchen aspirin 81 MG tablet Take 81 mg by mouth daily.    . beclomethasone (QVAR) 40 MCG/ACT inhaler Inhale 2 puffs into the lungs 2 (two) times daily. 1 Inhaler 6  . cloNIDine (CATAPRES) 0.1 MG tablet Take 0.1 mg by mouth 3 (three) times daily.    Marland Kitchen gabapentin (NEURONTIN) 400 MG capsule Take 2 capsules by mouth 3 (three) times daily.   0  . HYDROcodone-homatropine (HYCODAN) 5-1.5 MG/5ML syrup Take 5 mLs every 8 (eight) hours as needed by mouth for cough. 90 mL 0  . irbesartan (AVAPRO) 300 MG tablet     . methimazole (TAPAZOLE) 5 MG tablet  Take 1 tablet by mouth daily.    . methimazole (TAPAZOLE) 5 MG tablet Take by mouth.    . metoprolol tartrate (LOPRESSOR) 50 MG tablet Take 1 tablet (50 mg total) by mouth 2 (two) times daily. 180 tablet 3  . montelukast (SINGULAIR) 10 MG tablet Take 1 tablet (10 mg total) by mouth at bedtime. 30 tablet 3  . OVER THE COUNTER MEDICATION Take 1 capsule by mouth daily. ARTHROCEN 300 mg     . potassium chloride SA (K-DUR,KLOR-CON) 20 MEQ tablet Take 1 tablet (20 mEq total) by mouth daily. 90 tablet 3  . valsartan-hydrochlorothiazide (DIOVAN-HCT) 320-25 MG tablet   0   No current facility-administered medications on file prior to visit.     BP (!) 178/98   Pulse (!) 58   Temp 97.9 F (36.6 C) (Oral)   Resp 16   Ht 5\' 5"  (1.651 m)   Wt 249 lb (112.9 kg)   SpO2 100%   BMI 41.44 kg/m      Objective:   Physical Exam   General Mental Status- Alert. General Appearance- Not in acute distress.   Skin General: Color- Normal Color. Moisture- Normal Moisture.  Neck Carotid Arteries- Normal color. Moisture- Normal Moisture. No carotid bruits. No JVD.  Chest and Lung Exam Auscultation: Breath Sounds:-Normal.  Cardiovascular Auscultation:Rythm- Regular. Murmurs & Other Heart Sounds:Auscultation of the heart reveals- No Murmurs.  Abdomen Inspection:-Inspeection Normal. Palpation/Percussion:Note:No mass. Palpation and Percussion of the abdomen reveal- Non Tender, Non Distended + BS, no rebound or guarding.    Neurologic Cranial Nerve exam:- CN III-XII intact(No nystagmus), symmetric smile. Drift Test:- No drift. .Finger to Nose:- Normal/Intact Strength:- 5/5 equal and symmetric strength both upper and lower extremities.  Left hand- ventral aspect mid palm moderate puffy/swollen. Pain on palpation mid aspect to ventral aspect mcp region 3rd digit. Pt has parital flexion of digits of left hand.(upon inspection palm of left hand does look more swollen than rt side)  Left upper ext-  no tracking appearance up arm, no enlarged lymph nodes. No warmth.       Assessment & Plan:  For your left hand pain will rx doxycyline antibiotic. By exam concern for possible infection. But we do need to get labs today to evaluate other potential diagnosis. Cmp, cbc, sed rate, and uric acid.  For hand pain pending above studies use tylenol.   Tomorrow in morning get left hand xray.  If hand worsens or changes over weekend then ED evaluation.   For htn, I want to stop duexis. Due to effect on blood pressure. Hold since can nsaids can increase bp.  Check bp at home. If bp over 150/90 then recommend increase clonidine to 0.2 mg tid. You did not want to change  any bp meds today but states you would eat low salt diet. Watch yourself closely if any cardiac or neurologic signs or symptoms then ED evaluation.  Follow up on Monday or as needed  [employer], PA-C

## 2017-09-17 ENCOUNTER — Ambulatory Visit (HOSPITAL_BASED_OUTPATIENT_CLINIC_OR_DEPARTMENT_OTHER)
Admission: RE | Admit: 2017-09-17 | Discharge: 2017-09-17 | Disposition: A | Discharge status: Home / Self Care | Payer: PPO | Source: Ambulatory Visit | Attending: Medical | Admitting: Medical

## 2017-09-17 DIAGNOSIS — M79642 Pain in left hand: Secondary | ICD-10-CM

## 2017-09-17 DIAGNOSIS — M19042 Primary osteoarthritis, left hand: Secondary | ICD-10-CM | POA: Diagnosis not present

## 2017-09-17 LAB — CBC WITH DIFFERENTIAL/PLATELET
BASOS PCT: 1 % (ref 0.0–3.0)
Basophils Absolute: 0.1 10*3/uL (ref 0.0–0.1)
EOS ABS: 0.4 10*3/uL (ref 0.0–0.7)
Eosinophils Relative: 5.9 % — ABNORMAL HIGH (ref 0.0–5.0)
HEMATOCRIT: 34 % — AB (ref 36.0–46.0)
HEMOGLOBIN: 11.2 g/dL — AB (ref 12.0–15.0)
Lymphocytes Relative: 27.6 % (ref 12.0–46.0)
Lymphs Abs: 1.7 10*3/uL (ref 0.7–4.0)
MCHC: 33 g/dL (ref 30.0–36.0)
MCV: 85.6 fl (ref 78.0–100.0)
MONOS PCT: 7.8 % (ref 3.0–12.0)
Monocytes Absolute: 0.5 10*3/uL (ref 0.1–1.0)
Neutro Abs: 3.5 10*3/uL (ref 1.4–7.7)
Neutrophils Relative %: 57.7 % (ref 43.0–77.0)
Platelets: 369 10*3/uL (ref 150.0–400.0)
RBC: 3.97 Mil/uL (ref 3.87–5.11)
RDW: 12.6 % (ref 11.5–15.5)
WBC: 6 10*3/uL (ref 4.0–10.5)

## 2017-09-17 LAB — COMPREHENSIVE METABOLIC PANEL
ALBUMIN: 3.6 g/dL (ref 3.5–5.2)
ALK PHOS: 94 U/L (ref 39–117)
ALT: 10 U/L (ref 0–35)
AST: 17 U/L (ref 0–37)
BUN: 17 mg/dL (ref 6–23)
CO2: 27 mEq/L (ref 19–32)
Calcium: 9.7 mg/dL (ref 8.4–10.5)
Chloride: 101 mEq/L (ref 96–112)
Creatinine, Ser: 1.14 mg/dL (ref 0.40–1.20)
GFR: 61.39 mL/min (ref >60.00)
Glucose, Bld: 103 mg/dL — ABNORMAL HIGH (ref 70–99)
POTASSIUM: 3.6 meq/L (ref 3.5–5.1)
SODIUM: 137 meq/L (ref 135–145)
TOTAL PROTEIN: 8.5 g/dL — AB (ref 6.0–8.3)
Total Bilirubin: 0.7 mg/dL (ref 0.2–1.2)

## 2017-09-17 LAB — URIC ACID: Uric Acid, Serum: 6.8 mg/dL (ref 2.4–7.0)

## 2017-09-17 LAB — HEMOGLOBIN A1C: Hgb A1c MFr Bld: 7.2 % — ABNORMAL HIGH (ref 4.6–6.5)

## 2017-09-17 LAB — SEDIMENTATION RATE: Sed Rate: 62 mm/hr — ABNORMAL HIGH (ref 0–30)

## 2017-09-20 ENCOUNTER — Encounter: Payer: Self-pay | Admitting: Medical

## 2017-09-20 ENCOUNTER — Ambulatory Visit (INDEPENDENT_AMBULATORY_CARE_PROVIDER_SITE_OTHER): Payer: PPO | Admitting: Medical

## 2017-09-20 ENCOUNTER — Ambulatory Visit: Payer: PPO | Admitting: Family Medicine

## 2017-09-20 VITALS — BP 200/100 | HR 64 | Temp 98.2°F | Resp 16 | Ht 65.0 in | Wt 245.6 lb

## 2017-09-20 DIAGNOSIS — I10 Essential (primary) hypertension: Secondary | ICD-10-CM | POA: Diagnosis not present

## 2017-09-20 DIAGNOSIS — L089 Local infection of the skin and subcutaneous tissue, unspecified: Secondary | ICD-10-CM

## 2017-09-20 MED ORDER — CLONIDINE HCL 0.2 MG PO TABS: 0.2000 mg | ORAL_TABLET | Freq: Three times a day (TID) | ORAL | 0 refills | Status: DC

## 2017-09-20 NOTE — Patient Instructions (Signed)
Your left hand skin infection looks much improved compared to last week.  It appears that she responded very well to the antibiotic doxycycline.  Continue the antibiotic.  Your blood pressure is very high today although your neurologic exam appears good.  You also do not have associated signs and symptoms of stroke and do not report any cardiac symptoms.  I had asked you to increase her clonidine if your blood pressure was above 150/90.  Unfortunately the blood pressure is even worse than it was last time and that might be related to high salt foods that you describe this weekend.  I am going to prescribe clonidine at 0.2 mg to take 3 times daily.  Continue other blood pressure medications.  I do want you to have either appointment with me in 2 days or nurse blood pressure check appointment early a.m.  Reduce consumption of salty foods and avoid any caffeinated beverages.  If you have signs and symptoms as I discussed today then advised emergency department evaluation.   Follow-up in 2 days for blood pressure check or as needed.

## 2017-09-20 NOTE — Progress Notes (Signed)
Subjective:    Patient ID: Emily Wood, female    DOB: 1950-12-09, 67 y.o.   MRN: [id number]  HPI   Pt in for follow up.   Since I saw her last visit. She has not checked her blood pressure. She did not take the clonidine as I advised. She again attributes her bp being elevated due to food/salt content. Pt thinks she may have some blurred vision at times. Not constant(not presently). More when she looks at computer screen. No gross motor or sensory function deficits.   Pt left hand palm aspect much less red, less swollen, less tender and has good range of motion. Pt can flex hand now.Pt did take the doxycycline.   Review of Systems  Constitutional: Negative for chills, diaphoresis, fatigue and fever.  Eyes: Positive for visual disturbance.       Transient intermittent blurred vision when looking at computer.  Respiratory: Negative for cough, chest tightness, shortness of breath and wheezing.   Cardiovascular: Negative for chest pain and palpitations.  Gastrointestinal: Negative for abdominal pain.  Musculoskeletal: Negative for back pain.  Skin: Negative for rash.  Neurological: Negative for dizziness, syncope, speech difficulty, weakness, light-headedness, numbness and headaches.  Hematological: Negative for adenopathy. Does not bruise/bleed easily.  Psychiatric/Behavioral: Negative for behavioral problems and confusion. The patient is not nervous/anxious.    Past Medical History:  Diagnosis Date  . Arthritis   . Diabetes mellitus without complication ([place])   . Hypertension      Social History   Socioeconomic History  . Marital status: Married    Spouse name: [name]  . Number of children: 0  . Years of education: 11  . Highest education level: Not on file  Social Needs  . Financial resource strain: Not on file  . Food insecurity - worry: Not on file  . Food insecurity - inability: Not on file  . Transportation needs - medical: Not on file  . Transportation needs  - non-medical: Not on file  Occupational History  . Occupation: customer service  Tobacco Use  . Smoking status: Never Smoker  . Smokeless tobacco: Never Used  Substance and Sexual Activity  . Alcohol use: No    Alcohol/week: 0.0 oz  . Drug use: No  . Sexual activity: Not Currently    Birth control/protection: Post-menopausal  Other Topics Concern  . Not on file  Social History Narrative   Lives with husband   Caffeine use: 16oz coffee per day    Past Surgical History:  Procedure Laterality Date  . CHOLECYSTECTOMY    . HERNIA REPAIR    . OTHER SURGICAL HISTORY     scar tissue removal from bowels  . PARTIAL HYSTERECTOMY     Still has ovaries  . UTERINE FIBROID SURGERY      Family History  Problem Relation Age of Onset  . Hyperlipidemia Mother   . Hyperlipidemia Maternal Aunt     No Known Allergies  Current Outpatient Medications on File Prior to Visit  Medication Sig Dispense Refill  . Albuterol Sulfate (PROAIR RESPICLICK) 409 (90 Base) MCG/ACT AEPB Inhale 2 puffs into the lungs every 6 (six) hours as needed. 1 each 3  . Alpha-Lipoic Acid 300 MG CAPS Take 2 capsules by mouth daily.    Marland Kitchen aspirin 81 MG tablet Take 81 mg by mouth daily.    . beclomethasone (QVAR) 40 MCG/ACT inhaler Inhale 2 puffs into the lungs 2 (two) times daily. 1 Inhaler 6  . cloNIDine (CATAPRES) 0.1 MG  tablet Take 0.1 mg by mouth 3 (three) times daily.    Marland Kitchen doxycycline (VIBRA-TABS) 100 MG tablet Take 1 tablet (100 mg total) by mouth 2 (two) times daily. Caps or generic 20 tablet 0  . gabapentin (NEURONTIN) 400 MG capsule Take 2 capsules by mouth 3 (three) times daily.   0  . HYDROcodone-homatropine (HYCODAN) 5-1.5 MG/5ML syrup Take 5 mLs every 8 (eight) hours as needed by mouth for cough. 90 mL 0  . irbesartan (AVAPRO) 300 MG tablet     . metFORMIN (GLUCOPHAGE) 500 MG tablet Take 1 tablet (500 mg total) by mouth 2 (two) times daily with a meal. 180 tablet 2  . metFORMIN (GLUCOPHAGE) 500 MG tablet  Take 1 tablet (500 mg total) by mouth 2 (two) times daily with a meal. 180 tablet 2  . methimazole (TAPAZOLE) 5 MG tablet Take 1 tablet by mouth daily.    . methimazole (TAPAZOLE) 5 MG tablet Take by mouth.    . metoprolol tartrate (LOPRESSOR) 50 MG tablet Take 1 tablet (50 mg total) by mouth 2 (two) times daily. 180 tablet 3  . montelukast (SINGULAIR) 10 MG tablet Take 1 tablet (10 mg total) by mouth at bedtime. 30 tablet 3  . OVER THE COUNTER MEDICATION Take 1 capsule by mouth daily. ARTHROCEN 300 mg     . potassium chloride SA (K-DUR,KLOR-CON) 20 MEQ tablet Take 1 tablet (20 mEq total) by mouth daily. 90 tablet 3  . valsartan-hydrochlorothiazide (DIOVAN-HCT) 320-25 MG tablet   0   No current facility-administered medications on file prior to visit.     BP (!) 210/98   Pulse 64   Temp 98.2 F (36.8 C) (Oral)   Resp 16   Ht 5\' 5"  (1.651 m)   Wt 245 lb 9.6 oz (111.4 kg)   SpO2 100%   BMI 40.87 kg/m       Objective:   Physical Exam  General Mental Status- Alert. General Appearance- Not in acute distress.   Skin General: Color- Normal Color. Moisture- Normal Moisture.  Neck Carotid Arteries- Normal color. Moisture- Normal Moisture. No carotid bruits. No JVD.  Chest and Lung Exam Auscultation: Breath Sounds:-Normal.  Cardiovascular Auscultation:Rythm- Regular. Murmurs & Other Heart Sounds:Auscultation of the heart reveals- No Murmurs.  Abdomen Inspection:-Inspeection Normal. Palpation/Percussion:Note:No mass. Palpation and Percussion of the abdomen reveal- Non Tender, Non Distended + BS, no rebound or guarding.    Neurologic Cranial Nerve exam:- CN III-XII intact(No nystagmus), symmetric smile. Strength:- 5/5 equal and symmetric strength both upper and lower extremities.  Left hand- hand is much less swollen palmar aspect. Has good flexion and extension. Palm not warm, not tender.       Assessment & Plan:  Your left hand skin infection looks much improved  compared to last week.  It appears that she responded very well to the antibiotic doxycycline.  Continue the antibiotic.  Your blood pressure is very high today although your neurologic exam appears good.  You also do not have associated signs and symptoms of stroke and do not report any cardiac symptoms.  I had asked you to increase her clonidine if your blood pressure was above 150/90.  Unfortunately the blood pressure is even worse than it was last time and that might be related to high salt foods that you describe this weekend.  I am going to prescribe clonidine at 0.2 mg to take 3 times daily.  Continue other blood pressure medications.  I do want you to have either appointment with  me in 2 days or nurse blood pressure check appointment early a.m.  Reduce consumption of salty foods and avoid any caffeinated beverages.  If you have signs and symptoms as I discussed today then advised emergency department evaluation.   Mackie Pai, PA-C

## 2017-09-22 ENCOUNTER — Encounter: Payer: Self-pay | Admitting: Medical

## 2017-09-22 ENCOUNTER — Ambulatory Visit (INDEPENDENT_AMBULATORY_CARE_PROVIDER_SITE_OTHER): Payer: PPO | Admitting: Medical

## 2017-09-22 VITALS — BP 210/96 | HR 66 | Temp 98.2°F | Resp 16 | Ht 65.0 in | Wt 249.4 lb

## 2017-09-22 DIAGNOSIS — I10 Essential (primary) hypertension: Secondary | ICD-10-CM | POA: Diagnosis not present

## 2017-09-22 MED ORDER — AMLODIPINE BESYLATE 5 MG PO TABS: 5.0000 mg | ORAL_TABLET | Freq: Every day | ORAL | 0 refills | Status: DC

## 2017-09-22 NOTE — Progress Notes (Signed)
Subjective:    Patient ID: Emily Wood, female    DOB: July 28, 1950, 67 y.o.   MRN: [id number]  HPI  Pt in for follow up.  Pt has her at home bp readings. Yesterday 189/82 with her machine. Today her blood pressure is much higher initially when checked by medical assitant.Pt did take all her bp medication this am on the way here.  I did increase her clonidine the other day. She feels more sedated than usual. But not reporting any gross motor or sensory function deficits.  My readings today 170/80, 155/80 and 155/80. Marin Roberts CMA checked  bp readings 142/80 twice. Pt machine reading was 201/94.   Review of Systems  Constitutional: Negative for chills, fatigue and fever.  HENT: Negative for congestion, ear discharge and ear pain.   Respiratory: Negative for cough, chest tightness, shortness of breath and wheezing.   Cardiovascular: Negative for chest pain and palpitations.  Gastrointestinal: Negative for abdominal pain, constipation and nausea.  Musculoskeletal: Negative for back pain and neck pain.  Skin: Negative for rash.  Neurological: Negative for dizziness, seizures, weakness and headaches.  Hematological: Negative for adenopathy. Does not bruise/bleed easily.  Psychiatric/Behavioral: Negative for behavioral problems, confusion and hallucinations. The patient is not nervous/anxious.      Past Medical History:  Diagnosis Date  . Arthritis   . Diabetes mellitus without complication ([place])   . Hypertension      Social History   Socioeconomic History  . Marital status: Married    Spouse name: [name]  . Number of children: 0  . Years of education: 13  . Highest education level: Not on file  Social Needs  . Financial resource strain: Not on file  . Food insecurity - worry: Not on file  . Food insecurity - inability: Not on file  . Transportation needs - medical: Not on file  . Transportation needs - non-medical: Not on file  Occupational History  . Occupation:  customer service  Tobacco Use  . Smoking status: Never Smoker  . Smokeless tobacco: Never Used  Substance and Sexual Activity  . Alcohol use: No    Alcohol/week: 0.0 oz  . Drug use: No  . Sexual activity: Not Currently    Birth control/protection: Post-menopausal  Other Topics Concern  . Not on file  Social History Narrative   Lives with husband   Caffeine use: 16oz coffee per day    Past Surgical History:  Procedure Laterality Date  . CHOLECYSTECTOMY    . HERNIA REPAIR    . OTHER SURGICAL HISTORY     scar tissue removal from bowels  . PARTIAL HYSTERECTOMY     Still has ovaries  . UTERINE FIBROID SURGERY      Family History  Problem Relation Age of Onset  . Hyperlipidemia Mother   . Hyperlipidemia Maternal Aunt     No Known Allergies  Current Outpatient Medications on File Prior to Visit  Medication Sig Dispense Refill  . Albuterol Sulfate (PROAIR RESPICLICK) 628 (90 Base) MCG/ACT AEPB Inhale 2 puffs into the lungs every 6 (six) hours as needed. 1 each 3  . Alpha-Lipoic Acid 300 MG CAPS Take 2 capsules by mouth daily.    Marland Kitchen aspirin 81 MG tablet Take 81 mg by mouth daily.    . beclomethasone (QVAR) 40 MCG/ACT inhaler Inhale 2 puffs into the lungs 2 (two) times daily. 1 Inhaler 6  . cloNIDine (CATAPRES) 0.2 MG tablet Take 1 tablet (0.2 mg total) by mouth 3 (three) times  daily. 90 tablet 0  . doxycycline (VIBRA-TABS) 100 MG tablet Take 1 tablet (100 mg total) by mouth 2 (two) times daily. Caps or generic 20 tablet 0  . gabapentin (NEURONTIN) 400 MG capsule Take 2 capsules by mouth 3 (three) times daily.   0  . HYDROcodone-homatropine (HYCODAN) 5-1.5 MG/5ML syrup Take 5 mLs every 8 (eight) hours as needed by mouth for cough. 90 mL 0  . metFORMIN (GLUCOPHAGE) 500 MG tablet Take 1 tablet (500 mg total) by mouth 2 (two) times daily with a meal. 180 tablet 2  . metFORMIN (GLUCOPHAGE) 500 MG tablet Take 1 tablet (500 mg total) by mouth 2 (two) times daily with a meal. 180 tablet  2  . methimazole (TAPAZOLE) 5 MG tablet Take 1 tablet by mouth daily.    . methimazole (TAPAZOLE) 5 MG tablet Take by mouth.    . metoprolol tartrate (LOPRESSOR) 50 MG tablet Take 1 tablet (50 mg total) by mouth 2 (two) times daily. 180 tablet 3  . montelukast (SINGULAIR) 10 MG tablet Take 1 tablet (10 mg total) by mouth at bedtime. 30 tablet 3  . OVER THE COUNTER MEDICATION Take 1 capsule by mouth daily. ARTHROCEN 300 mg     . potassium chloride SA (K-DUR,KLOR-CON) 20 MEQ tablet Take 1 tablet (20 mEq total) by mouth daily. 90 tablet 3  . valsartan-hydrochlorothiazide (DIOVAN-HCT) 320-25 MG tablet   0   No current facility-administered medications on file prior to visit.     BP (!) 210/96   Pulse 66   Temp 98.2 F (36.8 C) (Oral)   Resp 16   Ht 5\' 5"  (1.651 m)   Wt 249 lb 6.4 oz (113.1 kg)   SpO2 100%   BMI 41.50 kg/m       Objective:   Physical Exam  General Mental Status- Alert. General Appearance- Not in acute distress.   Skin General: Color- Normal Color. Moisture- Normal Moisture.  Neck Carotid Arteries- Normal color. Moisture- Normal Moisture. No carotid bruits. No JVD.  Chest and Lung Exam Auscultation: Breath Sounds:-Normal.  Cardiovascular Auscultation:Rythm- Regular. Murmurs & Other Heart Sounds:Auscultation of the heart reveals- No Murmurs.  Abdomen Inspection:-Inspeection Normal. Palpation/Percussion:Note:No mass. Palpation and Percussion of the abdomen reveal- Non Tender, Non Distended + BS, no rebound or guarding.    Neurologic Cranial Nerve exam:- CN III-XII intact(No nystagmus), symmetric smile. Drift Test:- No drift. Finger to Nose:- Normal/Intact Strength:- 5/5 equal and symmetric strength both upper and lower extremities.      Assessment & Plan:  Your bp was very high today with machines but manual readings were much less. I want you to get wrist cuff bp monitor and keep the receipt. Check bp at home with this machine. Give me update on  readings. Update by my chart on Monday. If you see bp readings   If bp is above 140/90  add amlodipine to your current regimen.  Same instruction on any cardiac or neurologic advised ED evaluation.  Follow up this coming Monday or as needed.  Patient prescription for wrist electronic  blood pressure monitor.  I am not sure but I think her upper arm blood pressure cuff is inaccurate.  The inaccuracy might be related to the size of her arm.  Hopefully the wrist monitor will give her more accurate readings.  Initially when blood pressure was very high was going to refer her to cardiologist for resistant hypertension.  But when I kept checking it her blood pressure was coming down gradually.  It does make sense that she had taken all her medicines.  Among those are clonidine.

## 2017-09-22 NOTE — Patient Instructions (Addendum)
Your bp was very high today with machines but manual readings were much less. I want you to get wrist cuff bp monitor and keep the receipt. Check bp at home with the machine. Give me update on readings. Update by my chart on Monday. If you see bp readings   If bp is above 140/90  add amlodipine to your current regimen.  Same instruction on any cardiac or neurologic then advised ED evaluation.  Follow up this coming Monday  or as needed.

## 2017-09-26 ENCOUNTER — Encounter: Payer: Self-pay | Admitting: Medical

## 2017-09-27 ENCOUNTER — Encounter: Payer: Self-pay | Admitting: Medical

## 2017-09-27 ENCOUNTER — Ambulatory Visit (INDEPENDENT_AMBULATORY_CARE_PROVIDER_SITE_OTHER): Payer: PPO | Admitting: Medical

## 2017-09-27 VITALS — BP 158/85 | HR 67 | Temp 98.0°F | Resp 16 | Ht 65.0 in | Wt 249.0 lb

## 2017-09-27 DIAGNOSIS — I10 Essential (primary) hypertension: Secondary | ICD-10-CM | POA: Diagnosis not present

## 2017-09-27 MED ORDER — AMLODIPINE BESYLATE 5 MG PO TABS: 5.0000 mg | ORAL_TABLET | Freq: Every day | ORAL | 3 refills | Status: DC

## 2017-09-27 NOTE — Progress Notes (Signed)
Subjective:    Patient ID: Emily Wood, female    DOB: [birth year], 67 y.o.   MRN: [id number]  HPI  Pt in states feels well. No neurologic or cardiac  signs or symptoms.  B/P: on 09/21/17 @ 6:35 p.m. 189/82 and pul 62  09/23/17 @ 9:42 a.m. - 184/88 before Amlodipine 2:30 p.m. - 187/84 after Amlodipine 2:39 p.m. - 177/82 09/24/17 @ 3:30 p.m. - 187/89 and pul 64 09/25/17 - Didn't get a chance to take B/P 09/26/17 - 9:08 a.m. - 162/75 and pul 56  But the above was on her old machine. Which I questioned accuracy of on last visit. Also pt has large upper extremity.  On last visit I added 5 mg amlodipine to your current bp med regimen.  See last visit. BP levels were better  manually.   Review of Systems  Constitutional: Negative for chills, fatigue and fever.  Respiratory: Negative for cough, chest tightness, shortness of breath and wheezing.   Cardiovascular: Negative for chest pain and palpitations.  Gastrointestinal: Negative for anal bleeding.  Musculoskeletal: Negative for back pain and gait problem.  Skin: Negative for rash.  Neurological: Negative for dizziness, weakness, numbness and headaches.  Hematological: Negative for adenopathy. Does not bruise/bleed easily.  Psychiatric/Behavioral: Negative for behavioral problems and dysphoric mood.     Past Medical History:  Diagnosis Date  . Arthritis   . Diabetes mellitus without complication ([place])   . Hypertension      Social History   Socioeconomic History  . Marital status: Married    Spouse name: [name]  . Number of children: 0  . Years of education: 48  . Highest education level: Not on file  Social Needs  . Financial resource strain: Not on file  . Food insecurity - worry: Not on file  . Food insecurity - inability: Not on file    . Transportation needs - medical: Not on file  . Transportation needs - non-medical: Not on file  Occupational History  . Occupation: customer service  Tobacco Use  . Smoking status: Never Smoker  . Smokeless tobacco: Never Used  Substance and Sexual Activity  . Alcohol use: No    Alcohol/week: 0.0 oz  . Drug use: No  . Sexual activity: Not Currently    Birth control/protection: Post-menopausal  Other Topics Concern  . Not on file  Social History Narrative   Lives with husband   Caffeine use: 16oz coffee per day    Past Surgical History:  Procedure Laterality Date  . CHOLECYSTECTOMY    . HERNIA REPAIR    . OTHER SURGICAL HISTORY     scar tissue removal from bowels  . PARTIAL HYSTERECTOMY     Still has ovaries  . UTERINE FIBROID SURGERY      Family History  Problem Relation Age of Onset  . Hyperlipidemia Mother   . Hyperlipidemia Maternal Aunt     No Known Allergies  Current Outpatient Medications on File Prior to Visit  Medication Sig Dispense Refill  . Albuterol Sulfate (PROAIR RESPICLICK) 938 (90 Base) MCG/ACT AEPB Inhale 2 puffs into the lungs every 6 (six) hours as needed. 1 each 3  . Alpha-Lipoic Acid 300 MG CAPS Take 2 capsules by mouth daily.    Marland Kitchen amLODipine (NORVASC) 5 MG tablet Take 1 tablet (5 mg total) by mouth daily. 30 tablet 0  . aspirin 81 MG tablet Take 81 mg by mouth daily.    . beclomethasone (QVAR) 40 MCG/ACT inhaler Inhale 2 puffs  into the lungs 2 (two) times daily. 1 Inhaler 6  . cloNIDine (CATAPRES) 0.2 MG tablet Take 1 tablet (0.2 mg total) by mouth 3 (three) times daily. 90 tablet 0  . doxycycline (VIBRA-TABS) 100 MG tablet Take 1 tablet (100 mg total) by mouth 2 (two) times daily. Caps or generic 20 tablet 0  . gabapentin (NEURONTIN) 400 MG capsule Take 2 capsules by mouth 3 (three) times daily.   0  . metFORMIN (GLUCOPHAGE) 500 MG tablet Take 1 tablet (500 mg total) by mouth 2 (two) times daily with a meal. 180 tablet 2  . methimazole  (TAPAZOLE) 5 MG tablet Take 1 tablet by mouth daily.    . metoprolol tartrate (LOPRESSOR) 50 MG tablet Take 1 tablet (50 mg total) by mouth 2 (two) times daily. 180 tablet 3  . montelukast (SINGULAIR) 10 MG tablet Take 1 tablet (10 mg total) by mouth at bedtime. 30 tablet 3  . OVER THE COUNTER MEDICATION Take 1 capsule by mouth daily. ARTHROCEN 300 mg     . potassium chloride SA (K-DUR,KLOR-CON) 20 MEQ tablet Take 1 tablet (20 mEq total) by mouth daily. 90 tablet 3  . valsartan-hydrochlorothiazide (DIOVAN-HCT) 320-25 MG tablet   0   No current facility-administered medications on file prior to visit.     BP (!) 170/88   Pulse 67   Temp 98 F (36.7 C) (Oral)   Resp 16   Ht 5\' 5"  (1.651 m)   Wt 249 lb (112.9 kg)   SpO2 97%   BMI 41.44 kg/m       Objective:   Physical Exam  General Mental Status- Alert. General Appearance- Not in acute distress.   Skin General: Color- Normal Color. Moisture- Normal Moisture.  Neck Carotid Arteries- Normal color. Moisture- Normal Moisture. No carotid bruits. No JVD.  Chest and Lung Exam Auscultation: Breath Sounds:-Normal.  Cardiovascular Auscultation:Rythm- Regular. Murmurs & Other Heart Sounds:Auscultation of the heart reveals- No Murmurs.  Abdomen Inspection:-Inspeection Normal. Palpation/Percussion:Note:No mass. Palpation and Percussion of the abdomen reveal- Non Tender, Non Distended + BS, no rebound or guarding.   Neurologic Cranial Nerve exam:- CN III-XII intact(No nystagmus), symmetric smile. Strength:- 5/5 equal and symmetric strength both upper and lower extremities.  Lower ext- 1+ pedal edema bilaterally.   negative homans signs. Assessment & Plan:  Your blood pressure is still high today.   I do think your bp could be better controlled. So increasing amoldipine to 5 mg twice daily.  Keep checking bp daily. Do recommend considering getting bp cuff/wrist monitor. This may fit easier.  Follow up in 3 weeks with Dr.  Lorelei Pont or as needed  Can my chart me bp readings in 1 week.  Mackie Pai, PA-C   Discussed pedal edema side effects amlodipine.

## 2017-09-27 NOTE — Patient Instructions (Signed)
Your blood pressure is still high today.   I do think your bp could be better controlled. So increasing amoldipine to 5 mg twice daily.  Keep checking bp daily. Do recommend considering getting bp cuff/wrist monitor. This may fit easier.  Follow up in 3 weeks with Dr. Lorelei Pont or as needed  Can my chart me bp readings in 1 week.

## 2017-10-08 ENCOUNTER — Telehealth: Payer: Self-pay

## 2017-10-08 NOTE — Telephone Encounter (Addendum)
Received tier exception form via fax from [employer]- form completed and faxed to [phone]. Awaiting determination.

## 2017-10-11 NOTE — Telephone Encounter (Signed)
Tier exception denied.

## 2017-10-13 ENCOUNTER — Other Ambulatory Visit: Payer: Self-pay | Admitting: Medical

## 2017-10-14 DIAGNOSIS — R600 Localized edema: Secondary | ICD-10-CM | POA: Diagnosis not present

## 2017-10-14 DIAGNOSIS — N183 Chronic kidney disease, stage 3 (moderate): Secondary | ICD-10-CM | POA: Diagnosis not present

## 2017-10-14 DIAGNOSIS — E1129 Type 2 diabetes mellitus with other diabetic kidney complication: Secondary | ICD-10-CM | POA: Diagnosis not present

## 2017-10-14 DIAGNOSIS — I129 Hypertensive chronic kidney disease with stage 1 through stage 4 chronic kidney disease, or unspecified chronic kidney disease: Secondary | ICD-10-CM | POA: Diagnosis not present

## 2017-10-15 LAB — CBC AND DIFFERENTIAL
HCT: 36 (ref 36–46)
Hemoglobin: 11.7 — AB (ref 12.0–16.0)
PLATELETS: 380 (ref 150–399)
WBC: 5.7

## 2017-10-15 LAB — BASIC METABOLIC PANEL
BUN: 30 — AB (ref 4–21)
CREATININE: 1.3 — AB (ref 0.5–1.1)

## 2017-10-15 LAB — LIPID PANEL
CHOLESTEROL: 166 (ref 0–200)
HDL: 57 (ref 35–70)
LDL CALC: 92
Triglycerides: 83 (ref 40–160)

## 2017-10-15 LAB — TSH: TSH: 0.05 — AB (ref 0.41–5.90)

## 2017-10-15 LAB — VITAMIN D 25 HYDROXY (VIT D DEFICIENCY, FRACTURES): Vit D, 25-Hydroxy: 41.6

## 2017-10-26 NOTE — Progress Notes (Signed)
[name] at [facility] [address] [place], [place] [address] [phone]  Date:  10/28/2017   Name:  Emily Wood   DOB:  Dec 03, 1950   MRN:  [id number]  PCP:  Darreld Mclean, MD    Chief Complaint: Follow-up (Pt here for 3 week follow up visit. c/o lower left sided abd pain that started today. )   History of Present Illness:  Emily Wood is a 67 y.o. very pleasant female patient who presents with the following:  Short term follow-up today for HTN- we have seen here a few times in March and increased her clonidine and added amlodpine Seen on 3/11 by Percell Miller: Your blood pressure is still high today.  I do think your bp could be better controlled. So increasing amoldipine to 5 mg twice daily. Keep checking bp daily. Do recommend considering getting bp cuff/wrist monitor. This may fit easier. Follow up in 3 weeks with Dr. Lorelei Pont or as needed  She is taking amlidpine 5 mg and clonidine was increased to 0.2 TID She is also on metoprolol 40 BID and valsartan320/hctz 25, potassium 20 meq  She notes a pain in her left lower abd/ hip just this am.  It is tender if she pushes on her hip bone No nausea or vomiting No fever No diarrhea She is not really sure if pain is in her belly or if MSK NKI  She feels well but is concerned about continued high BP She is in the process of getting a new BP cuff and is not checking her BP at home just now BP Readings from Last 3 Encounters:  10/28/17 (!) 148/82  09/27/17 (!) 158/85  09/22/17 (!) 210/96    BP Readings from Last 3 Encounters:  10/28/17 (!) 148/82  09/27/17 (!) 158/85  09/22/17 (!) 210/96     Patient Active Problem List   Diagnosis Date Noted  . Lymphedema 01/25/2017  . Fibroid uterus 01/14/2017  . Hypertensive heart disease without heart failure 10/01/2016  . Chronic kidney disease, stage 3 ([place]) 09/21/2016  . Meralgia paresthetica of left side 08/27/2015   . Diabetes mellitus type 2, controlled ([place]) 08/02/2015  . Chronic venous insufficiency 06/28/2015  . Essential hypertension 02/15/2015  . Peripheral edema 02/15/2015  . Chronic knee pain 02/15/2015    Past Medical History:  Diagnosis Date  . Arthritis   . Diabetes mellitus without complication ([place])   . Hypertension     Past Surgical History:  Procedure Laterality Date  . CHOLECYSTECTOMY    . HERNIA REPAIR    . OTHER SURGICAL HISTORY     scar tissue removal from bowels  . PARTIAL HYSTERECTOMY     Still has ovaries  . UTERINE FIBROID SURGERY      Social History   Tobacco Use  . Smoking status: Never Smoker  . Smokeless tobacco: Never Used  Substance Use Topics  . Alcohol use: No    Alcohol/week: 0.0 oz  . Drug use: No    Family History  Problem Relation Age of Onset  . Hyperlipidemia Mother   . Hyperlipidemia Maternal Aunt     No Known Allergies  Medication list has been reviewed and updated.  Current Outpatient Medications on File Prior to Visit  Medication Sig Dispense Refill  . Albuterol Sulfate (PROAIR RESPICLICK) 353 (90 Base) MCG/ACT AEPB Inhale 2 puffs into the lungs every 6 (six) hours as needed. 1 each 3  . Alpha-Lipoic Acid 300  MG CAPS Take 2 capsules by mouth daily.    Marland Kitchen amLODipine (NORVASC) 5 MG tablet Take 1 tablet (5 mg total) by mouth daily. 180 tablet 3  . aspirin 81 MG tablet Take 81 mg by mouth daily.    . beclomethasone (QVAR) 40 MCG/ACT inhaler Inhale 2 puffs into the lungs 2 (two) times daily. 1 Inhaler 6  . doxycycline (VIBRA-TABS) 100 MG tablet Take 1 tablet (100 mg total) by mouth 2 (two) times daily. Caps or generic 20 tablet 0  . gabapentin (NEURONTIN) 400 MG capsule Take 2 capsules by mouth 3 (three) times daily.   0  . metFORMIN (GLUCOPHAGE) 500 MG tablet Take 1 tablet (500 mg total) by mouth 2 (two) times daily with a meal. 180 tablet 2  . methimazole (TAPAZOLE) 5 MG tablet Take 1 tablet by mouth daily.    . metoprolol tartrate  (LOPRESSOR) 50 MG tablet Take 1 tablet (50 mg total) by mouth 2 (two) times daily. 180 tablet 3  . montelukast (SINGULAIR) 10 MG tablet Take 1 tablet (10 mg total) by mouth at bedtime. 30 tablet 3  . OVER THE COUNTER MEDICATION Take 1 capsule by mouth daily. ARTHROCEN 300 mg     . valsartan-hydrochlorothiazide (DIOVAN-HCT) 320-25 MG tablet   0   No current facility-administered medications on file prior to visit.     Review of Systems:  As per HPI- otherwise negative. No fever or chills She does tend to have chronic LE edema, not any worse since she added amlodipine to her regimen  Physical Examination: Vitals:   10/28/17 1732  BP: (!) 148/82  Pulse: 62  Temp: 98.4 F (36.9 C)  SpO2: 97%   Vitals:   10/28/17 1732  Weight: 228 lb (103.4 kg)  Height: 5\' 5"  (1.651 m)   Body mass index is 37.94 kg/m. Ideal Body Weight: Weight in (lb) to have BMI = 25: 149.9  GEN: WDWN, NAD, Non-toxic, A & O x 3, obese, looks well  HEENT: Atraumatic, Normocephalic. Neck supple. No masses, No LAD. Ears and Nose: No external deformity. CV: RRR, No M/G/R. No JVD. No thrill. No extra heart sounds. PULM: CTA B, no wheezes, crackles, rhonchi. No retractions. No resp. distress. No accessory muscle use. ABD: S, NT, ND, +BS. No rebound. No HSM.  Belly is really benign tenderness is located over the anterior left bony pelvis EXTR: No c/c/1+ LE edema bilatearlly NEURO Normal gait.  PSYCH: Normally interactive. Conversant. Not depressed or anxious appearing.  Calm demeanor.    Assessment and Plan: Essential hypertension - Plan: hydrochlorothiazide (HYDRODIURIL) 25 MG tablet, cloNIDine (CATAPRES) 0.2 MG tablet  Peripheral edema - Plan: potassium chloride SA (K-DUR,KLOR-CON) 20 MEQ tablet  Left hip pain  Her BP is closer to normal Will add another 25 mg of HCTZ as she also does suffer from edema of her LE.   Continue clonidine for now but hope that we can decrease this dose eventually Plan to visit  on one month to check on her BP and lytes   Left anterior bony pelvis/ hip pain today only.  At this time suspect a benign MSK etiology but asked Pattijo to let me know if this persists or changes, she agrees to do so  Signed [name], MD

## 2017-10-28 ENCOUNTER — Encounter: Payer: Self-pay | Admitting: Family Medicine

## 2017-10-28 ENCOUNTER — Ambulatory Visit (INDEPENDENT_AMBULATORY_CARE_PROVIDER_SITE_OTHER): Payer: PPO | Admitting: Family Medicine

## 2017-10-28 VITALS — BP 148/82 | HR 62 | Temp 98.4°F | Ht 65.0 in | Wt 228.0 lb

## 2017-10-28 DIAGNOSIS — I10 Essential (primary) hypertension: Secondary | ICD-10-CM | POA: Diagnosis not present

## 2017-10-28 DIAGNOSIS — M25552 Pain in left hip: Secondary | ICD-10-CM | POA: Diagnosis not present

## 2017-10-28 DIAGNOSIS — R609 Edema, unspecified: Secondary | ICD-10-CM | POA: Diagnosis not present

## 2017-10-28 MED ORDER — POTASSIUM CHLORIDE CRYS ER 20 MEQ PO TBCR: 20.0000 meq | EXTENDED_RELEASE_TABLET | Freq: Every day | ORAL | 3 refills | Status: DC

## 2017-10-28 MED ORDER — HYDROCHLOROTHIAZIDE 25 MG PO TABS: 25.0000 mg | ORAL_TABLET | Freq: Every day | ORAL | 3 refills | Status: DC

## 2017-10-28 MED ORDER — CLONIDINE HCL 0.2 MG PO TABS: 0.2000 mg | ORAL_TABLET | Freq: Three times a day (TID) | ORAL | 1 refills | Status: DC

## 2017-10-28 NOTE — Patient Instructions (Addendum)
I gave you an rx for a separate 25 mg HCTZ.  Please take this in the mooning along with your valsartan/hctz Please see me in about one month and we will re-assess

## 2017-10-29 ENCOUNTER — Encounter: Payer: Self-pay | Admitting: Family Medicine

## 2017-11-10 ENCOUNTER — Ambulatory Visit (INDEPENDENT_AMBULATORY_CARE_PROVIDER_SITE_OTHER): Payer: PPO | Admitting: Nurse Practitioner

## 2017-11-10 ENCOUNTER — Ambulatory Visit: Payer: PPO | Admitting: Nurse Practitioner

## 2017-11-10 ENCOUNTER — Encounter: Payer: Self-pay | Admitting: Nurse Practitioner

## 2017-11-10 ENCOUNTER — Telehealth: Payer: Self-pay | Admitting: Nurse Practitioner

## 2017-11-10 VITALS — BP 140/82 | HR 66 | Temp 98.9°F | Ht 65.0 in | Wt 224.0 lb

## 2017-11-10 DIAGNOSIS — J060 Acute laryngopharyngitis: Secondary | ICD-10-CM

## 2017-11-10 DIAGNOSIS — R0981 Nasal congestion: Secondary | ICD-10-CM | POA: Diagnosis not present

## 2017-11-10 LAB — POCT RAPID STREP A (OFFICE): Rapid Strep A Screen: NEGATIVE

## 2017-11-10 MED ORDER — FLUTICASONE PROPIONATE 50 MCG/ACT NA SUSP: 2.0000 | Freq: Every day | NASAL | 0 refills | Status: DC

## 2017-11-10 MED ORDER — MAGIC MOUTHWASH W/LIDOCAINE: 5.0000 mL | Freq: Three times a day (TID) | ORAL | 0 refills | Status: DC | PRN

## 2017-11-10 MED ORDER — FIRST-DUKES MOUTHWASH MT SUSP: 5.0000 mL | Freq: Three times a day (TID) | OROMUCOSAL | 0 refills | Status: DC

## 2017-11-10 NOTE — Telephone Encounter (Signed)
Im not sure what they are asking?...Marland KitchenMarland Kitchen

## 2017-11-10 NOTE — Progress Notes (Signed)
Subjective:  Patient ID: Emily Wood, female    DOB: October 05, 1950  Age: 67 y.o. MRN: [id number]  CC: Sore Throat (Monday throat starting feeling scratchy, feeling tired, Throats feel raw and painful. nauseous yesterday.)  URI   This is a new problem. The current episode started in the past 7 days. The problem has been unchanged. There has been no fever. Associated symptoms include congestion, coughing, nausea, a plugged ear sensation, rhinorrhea, sinus pain and a sore throat. Pertinent negatives include no abdominal pain, chest pain, diarrhea, dysuria, ear pain, headaches, joint pain, joint swelling, neck pain, rash, sneezing, swollen glands, vomiting or wheezing. She has tried nothing for the symptoms.  no known close sick contact.  Outpatient Medications Prior to Visit  Medication Sig Dispense Refill  . Albuterol Sulfate (PROAIR RESPICLICK) 400 (90 Base) MCG/ACT AEPB Inhale 2 puffs into the lungs every 6 (six) hours as needed. 1 each 3  . Alpha-Lipoic Acid 300 MG CAPS Take 2 capsules by mouth daily.    Marland Kitchen amLODipine (NORVASC) 5 MG tablet Take 1 tablet (5 mg total) by mouth daily. 180 tablet 3  . aspirin 81 MG tablet Take 81 mg by mouth daily.    . beclomethasone (QVAR) 40 MCG/ACT inhaler Inhale 2 puffs into the lungs 2 (two) times daily. 1 Inhaler 6  . cloNIDine (CATAPRES) 0.2 MG tablet Take 1 tablet (0.2 mg total) by mouth 3 (three) times daily. 270 tablet 1  . gabapentin (NEURONTIN) 400 MG capsule Take 2 capsules by mouth 3 (three) times daily.   0  . hydrochlorothiazide (HYDRODIURIL) 25 MG tablet Take 1 tablet (25 mg total) by mouth daily. 90 tablet 3  . irbesartan (AVAPRO) 300 MG tablet   3  . metFORMIN (GLUCOPHAGE) 500 MG tablet Take 1 tablet (500 mg total) by mouth 2 (two) times daily with a meal. 180 tablet 2  . methimazole (TAPAZOLE) 5 MG tablet Take 1 tablet by mouth daily.    . metoprolol tartrate (LOPRESSOR) 50 MG tablet Take 1 tablet (50 mg total) by mouth 2 (two) times  daily. 180 tablet 3  . montelukast (SINGULAIR) 10 MG tablet Take 1 tablet (10 mg total) by mouth at bedtime. 30 tablet 3  . OVER THE COUNTER MEDICATION Take 1 capsule by mouth daily. ARTHROCEN 300 mg     . potassium chloride SA (K-DUR,KLOR-CON) 20 MEQ tablet Take 1 tablet (20 mEq total) by mouth daily. 90 tablet 3  . valsartan-hydrochlorothiazide (DIOVAN-HCT) 320-25 MG tablet   0   No facility-administered medications prior to visit.     ROS See HPI  Objective:  BP 140/82 (BP Location: Left Arm, Patient Position: Sitting, Cuff Size: Large)   Pulse 66   Temp 98.9 F (37.2 C) (Oral)   Ht 5\' 5"  (1.651 m)   Wt 224 lb (101.6 kg)   SpO2 98%   BMI 37.28 kg/m   BP Readings from Last 3 Encounters:  11/10/17 140/82  10/28/17 (!) 148/82  09/27/17 (!) 158/85    Wt Readings from Last 3 Encounters:  11/10/17 224 lb (101.6 kg)  10/28/17 228 lb (103.4 kg)  09/27/17 249 lb (112.9 kg)    Physical Exam  Constitutional: She is oriented to person, place, and time.  HENT:  Right Ear: Tympanic membrane, external ear and ear canal normal.  Left Ear: Tympanic membrane, external ear and ear canal normal.  Nose: Mucosal edema and rhinorrhea present. Right sinus exhibits no maxillary sinus tenderness and no frontal sinus tenderness. Left sinus  exhibits no maxillary sinus tenderness and no frontal sinus tenderness.  Mouth/Throat: Uvula is midline. No trismus in the jaw. Posterior oropharyngeal erythema present. No oropharyngeal exudate or posterior oropharyngeal edema.  Eyes: No scleral icterus.  Neck: Normal range of motion. Neck supple.  Cardiovascular: Normal rate.  Pulmonary/Chest: Effort normal and breath sounds normal.  Musculoskeletal: She exhibits no edema.  Lymphadenopathy:    She has no cervical adenopathy.  Neurological: She is alert and oriented to person, place, and time.  Skin: No rash noted.  Vitals reviewed.   Lab Results  Component Value Date   WBC 5.7 10/15/2017   HGB  11.7 (A) 10/15/2017   HCT 36 10/15/2017   PLT 380 10/15/2017   GLUCOSE 103 (H) 09/16/2017   CHOL 166 10/15/2017   TRIG 83 10/15/2017   HDL 57 10/15/2017   LDLCALC 92 10/15/2017   ALT 10 09/16/2017   AST 17 09/16/2017   NA 137 09/16/2017   K 3.6 09/16/2017   CL 101 09/16/2017   CREATININE 1.3 (A) 10/15/2017   BUN 30 (A) 10/15/2017   CO2 27 09/16/2017   TSH 0.05 (A) 10/15/2017   HGBA1C 7.2 (H) 09/16/2017    Dg Hand Complete Left  Result Date: 09/17/2017 CLINICAL DATA:  Left hand pain at the base of the third finger. No reported injury. EXAM: LEFT HAND - COMPLETE 3+ VIEW COMPARISON:  None FINDINGS: No fracture or dislocation. No suspicious focal osseous lesion. Mild osteoarthritis at the interphalangeal joint left thumb. No joint erosions. No radiopaque foreign body. IMPRESSION: No acute osseous abnormality. Mild osteoarthritis in the interphalangeal joint of the left thumb. Electronically Signed   By: Emily Wood M.D.   On: 09/17/2017 08:23    Assessment & Plan:   Emily Wood was seen today for sore throat.  Diagnoses and all orders for this visit:  Sore throat and laryngitis -     POCT rapid strep A -     magic mouthwash w/lidocaine SOLN; Take 5 mLs by mouth 3 (three) times daily as needed for mouth pain.  Nasal congestion -     fluticasone (FLONASE) 50 MCG/ACT nasal spray; Place 2 sprays into both nostrils daily.   I am having Emily Wood start on magic mouthwash w/lidocaine and fluticasone. I am also having her maintain her aspirin, Alpha-Lipoic Acid, OVER THE COUNTER MEDICATION, gabapentin, Albuterol Sulfate, beclomethasone, valsartan-hydrochlorothiazide, montelukast, methimazole, metoprolol tartrate, metFORMIN, amLODipine, hydrochlorothiazide, potassium chloride SA, cloNIDine, and irbesartan.  Meds ordered this encounter  Medications  . magic mouthwash w/lidocaine SOLN    Sig: Take 5 mLs by mouth 3 (three) times daily as needed for mouth pain.    Dispense:  50 mL     Refill:  0    Order Specific Question:   Supervising Provider    Answer:   Emily Wood [[id number]]  . fluticasone (FLONASE) 50 MCG/ACT nasal spray    Sig: Place 2 sprays into both nostrils daily.    Dispense:  16 g    Refill:  0    Order Specific Question:   Supervising Provider    Answer:   Emily Wood [[id number]]    Follow-up: No follow-ups on file.  Wilfred Lacy, NP

## 2017-11-10 NOTE — Patient Instructions (Signed)

## 2017-11-10 NOTE — Telephone Encounter (Signed)
Copied from [place] [id number]. Topic: Quick Communication - Rx Refill/Question >> Nov 10, 2017 12:35 PM Waylan Rocher, Lumin L wrote: Medication: magic mouthwash w/lidocaine SOLN (clarify what is supposed to go into the mouthwash for the pharmacy please) Has the patient contacted their pharmacy? Yes.   (Agent: If no, request that the patient contact the pharmacy for the refill.) Preferred Pharmacy (with phone number or street name): [id number] - [place], [place] [address] Phone: [phone] Fax: [phone] Agent: Please be advised that RX refills may take up to 3 business days. We ask that you follow-up with your pharmacy.  Clarify ingredients for magic mouthwash. When calling back please ask for Marita Kansas, the pharmacist.

## 2017-11-15 ENCOUNTER — Ambulatory Visit (HOSPITAL_BASED_OUTPATIENT_CLINIC_OR_DEPARTMENT_OTHER)
Admission: RE | Admit: 2017-11-15 | Discharge: 2017-11-15 | Disposition: A | Discharge status: Home / Self Care | Payer: PPO | Source: Ambulatory Visit | Attending: Family Medicine | Admitting: Family Medicine

## 2017-11-15 ENCOUNTER — Encounter: Payer: Self-pay | Admitting: Family Medicine

## 2017-11-15 ENCOUNTER — Ambulatory Visit (INDEPENDENT_AMBULATORY_CARE_PROVIDER_SITE_OTHER): Payer: PPO | Admitting: Family Medicine

## 2017-11-15 VITALS — BP 161/86 | HR 65 | Temp 97.7°F | Resp 16 | Ht 65.0 in | Wt 224.0 lb

## 2017-11-15 DIAGNOSIS — R059 Cough, unspecified: Secondary | ICD-10-CM

## 2017-11-15 DIAGNOSIS — R05 Cough: Secondary | ICD-10-CM

## 2017-11-15 DIAGNOSIS — R5381 Other malaise: Secondary | ICD-10-CM | POA: Diagnosis not present

## 2017-11-15 DIAGNOSIS — R918 Other nonspecific abnormal finding of lung field: Secondary | ICD-10-CM | POA: Insufficient documentation

## 2017-11-15 DIAGNOSIS — I872 Venous insufficiency (chronic) (peripheral): Secondary | ICD-10-CM | POA: Diagnosis not present

## 2017-11-15 DIAGNOSIS — I517 Cardiomegaly: Secondary | ICD-10-CM | POA: Insufficient documentation

## 2017-11-15 DIAGNOSIS — R52 Pain, unspecified: Secondary | ICD-10-CM | POA: Diagnosis not present

## 2017-11-15 LAB — POC INFLUENZA A&B (BINAX/QUICKVUE)
INFLUENZA A, POC: NEGATIVE
INFLUENZA B, POC: NEGATIVE

## 2017-11-15 MED ORDER — AZITHROMYCIN 250 MG PO TABS: ORAL_TABLET | ORAL | 0 refills | Status: DC

## 2017-11-15 MED ORDER — HYDROCODONE-HOMATROPINE 5-1.5 MG/5ML PO SYRP: 5.0000 mL | ORAL_SOLUTION | Freq: Three times a day (TID) | ORAL | 0 refills | Status: AC | PRN

## 2017-11-15 NOTE — Progress Notes (Signed)
[place] at [facility] [address], [place], [place] [address] [phone]  Date:  11/15/2017   Name:  Emily Wood   DOB:  1950/11/03   MRN:  [id number]  PCP:  Darreld Mclean, MD    Chief Complaint: URI (fatigue, chills, sore throat, runny nose, body just doesnt "feel right" coughing worse at night, saw Baldo Ash Nche-given mouthwash and flonase)   History of Present Illness:  Emily Wood is a 67 y.o. very pleasant female patient who presents with the following:  Here today with concern of a cold/ URI She was seen by Wilfred Lacy, NP on 4/24 and treated with flonase and magic mouthwash prn  Negative rapid strep at that viist  She notes that she continues to have a cough and her throat still hurts.  Her voice is still hoarse She notes a runny nose She has noted body aches, chills, occasional nausea No vomiting The cough can be productive of mucus  History of HTN, DM, CKD She is treated with amlodipine, HCTZ, avapro, diovan hct Lab Results  Component Value Date   HGBA1C 7.2 (H) 09/16/2017   BP Readings from Last 3 Encounters:  11/15/17 (!) 161/86  11/10/17 140/82  10/28/17 (!) 148/82   Wt Readings from Last 3 Encounters:  11/15/17 224 lb (101.6 kg)  11/10/17 224 lb (101.6 kg)  10/28/17 228 lb (103.4 kg)     Patient Active Problem List   Diagnosis Date Noted  . Lymphedema 01/25/2017  . Fibroid uterus 01/14/2017  . Hypertensive heart disease without heart failure 10/01/2016  . Chronic kidney disease, stage 3 ([place]) 09/21/2016  . Meralgia paresthetica of left side 08/27/2015  . Diabetes mellitus type 2, controlled ([place]) 08/02/2015  . Chronic venous insufficiency 06/28/2015  . Essential hypertension 02/15/2015  . Peripheral edema 02/15/2015  . Chronic knee pain 02/15/2015    Past Medical History:  Diagnosis Date  . Arthritis   . Diabetes mellitus without complication ([place])   . Hypertension      Past Surgical History:  Procedure Laterality Date  . CHOLECYSTECTOMY    . HERNIA REPAIR    . OTHER SURGICAL HISTORY     scar tissue removal from bowels  . PARTIAL HYSTERECTOMY     Still has ovaries  . UTERINE FIBROID SURGERY      Social History   Tobacco Use  . Smoking status: Never Smoker  . Smokeless tobacco: Never Used  Substance Use Topics  . Alcohol use: No    Alcohol/week: 0.0 oz  . Drug use: No    Family History  Problem Relation Age of Onset  . Hyperlipidemia Mother   . Hyperlipidemia Maternal Aunt     No Known Allergies  Medication list has been reviewed and updated.  Current Outpatient Medications on File Prior to Visit  Medication Sig Dispense Refill  . Albuterol Sulfate (PROAIR RESPICLICK) 734 (90 Base) MCG/ACT AEPB Inhale 2 puffs into the lungs every 6 (six) hours as needed. 1 each 3  . Alpha-Lipoic Acid 300 MG CAPS Take 2 capsules by mouth daily.    Marland Kitchen amLODipine (NORVASC) 5 MG tablet Take 1 tablet (5 mg total) by mouth daily. 180 tablet 3  . aspirin 81 MG tablet Take 81 mg by mouth daily.    . beclomethasone (QVAR) 40 MCG/ACT inhaler Inhale 2 puffs into the lungs 2 (two) times daily. 1 Inhaler 6  . cloNIDine (CATAPRES) 0.2 MG tablet Take 1 tablet (0.2  mg total) by mouth 3 (three) times daily. 270 tablet 1  . Diphenhyd-Hydrocort-Nystatin (FIRST-DUKES MOUTHWASH) SUSP Use as directed 5 mLs in the mouth or throat 3 (three) times daily after meals. Equal part of diphenhydramine, hydrocortisone, nystatin and lidocaine. 1 Bottle 0  . fluticasone (FLONASE) 50 MCG/ACT nasal spray Place 2 sprays into both nostrils daily. 16 g 0  . gabapentin (NEURONTIN) 400 MG capsule Take 2 capsules by mouth 3 (three) times daily.   0  . hydrochlorothiazide (HYDRODIURIL) 25 MG tablet Take 1 tablet (25 mg total) by mouth daily. 90 tablet 3  . irbesartan (AVAPRO) 300 MG tablet   3  . metFORMIN (GLUCOPHAGE) 500 MG tablet Take 1 tablet (500 mg total) by mouth 2 (two) times daily  with a meal. 180 tablet 2  . methimazole (TAPAZOLE) 5 MG tablet Take 1 tablet by mouth daily.    . metoprolol tartrate (LOPRESSOR) 50 MG tablet Take 1 tablet (50 mg total) by mouth 2 (two) times daily. 180 tablet 3  . montelukast (SINGULAIR) 10 MG tablet Take 1 tablet (10 mg total) by mouth at bedtime. 30 tablet 3  . OVER THE COUNTER MEDICATION Take 1 capsule by mouth daily. ARTHROCEN 300 mg     . potassium chloride SA (K-DUR,KLOR-CON) 20 MEQ tablet Take 1 tablet (20 mEq total) by mouth daily. 90 tablet 3  . valsartan-hydrochlorothiazide (DIOVAN-HCT) 320-25 MG tablet   0   No current facility-administered medications on file prior to visit.     Review of Systems:  As per HPI- otherwise negative. No vomiting or diarrhea No rash   Physical Examination: Vitals:   11/15/17 1224  BP: (!) 161/86  Pulse: 65  Resp: 16  Temp: 97.7 F (36.5 C)  SpO2: 99%   Vitals:   11/15/17 1224  Weight: 224 lb (101.6 kg)  Height: 5\' 5"  (1.651 m)   Body mass index is 37.28 kg/m. Ideal Body Weight: Weight in (lb) to have BMI = 25: 149.9  GEN: WDWN, NAD, Non-toxic, A & O x 3, obese, looks well but is coughing some in room today HEENT: Atraumatic, Normocephalic. Neck supple. No masses, No LAD.  Bilateral TM wnl, oropharynx normal.  PEERL,EOMI.   Ears and Nose: No external deformity. CV: RRR, No M/G/R. No JVD. No thrill. No extra heart sounds. PULM: CTA B, no wheezes, crackles, rhonchi. No retractions. No resp. distress. No accessory muscle use. ABD: S, NT, ND, +BS. No rebound. No HSM. EXTR: No c/c She has known chronic edema of her LE, more so on the left.  The skin on her left ankle has become thickened and hyperpigmented, and is flaking off like the bark of a tree.  Emily Wood is worried about this, it has been present for some months NEURO Normal gait.  PSYCH: Normally interactive. Conversant. Not depressed or anxious appearing.  Calm demeanor.    Results for orders placed or performed in visit on  11/15/17  POC Influenza A&B (Binax test)  Result Value Ref Range   Influenza A, POC Negative Negative   Influenza B, POC Negative Negative   Dg Chest 2 View  Result Date: 11/15/2017 CLINICAL DATA:  67 year old female with productive cough and malaise for 1 week. EXAM: CHEST - 2 VIEW COMPARISON:  Chest radiographs 12/16/2016 and earlier. FINDINGS: Stable mediastinal contours with mild to moderate cardiomegaly. Visualized tracheal air column is within normal limits. Lung volumes are stable and within normal limits. There is mild curvilinear scarring along the lateral aspect of the minor  fissure which appears stable. There is also mild diffuse increased pulmonary interstitial prominence which is stable since 2018, but new or increased compared to 2017 radiographs. No pneumothorax, pulmonary edema, pleural effusion or other acute pulmonary opacity. No acute osseous abnormality identified. Stable cholecystectomy clips. Negative visible bowel gas pattern. IMPRESSION: Mild diffuse pulmonary interstitial prominence raising the possibility of viral/atypical respiratory infection. No other acute cardiopulmonary abnormality. Mild chronic cardiomegaly. Electronically Signed   By: Genevie Ann M.D.   On: 11/15/2017 12:58    Assessment and Plan: Body aches - Plan: POC Influenza A&B (Binax test), DG Chest 2 View  Cough - Plan: DG Chest 2 View, azithromycin (ZITHROMAX) 250 MG tablet, HYDROcodone-homatropine (HYCODAN) 5-1.5 MG/5ML syrup  Malaise - Plan: DG Chest 2 View  Venous stasis dermatitis of left lower extremity - Plan: Ambulatory referral to Dermatology  Here today with illness  CXR does not show lobar pneumonia Treat with azithromycin She will let me know if not better Dermatology consultation for issue with her ankle Signed [name], MD

## 2017-11-15 NOTE — Patient Instructions (Signed)
Good to see you today-  I am going to treat you for bronchitis with azithromycin (zpack)- please let me know if you are not feeling better in the next few days.   We will also refer you to dermatology to check on your left ankle!

## 2017-11-24 DIAGNOSIS — E0500 Thyrotoxicosis with diffuse goiter without thyrotoxic crisis or storm: Secondary | ICD-10-CM | POA: Diagnosis not present

## 2017-11-25 ENCOUNTER — Encounter: Payer: Self-pay | Admitting: Family Medicine

## 2017-11-25 ENCOUNTER — Ambulatory Visit (INDEPENDENT_AMBULATORY_CARE_PROVIDER_SITE_OTHER): Payer: PPO | Admitting: Family Medicine

## 2017-11-25 VITALS — BP 140/72 | HR 71 | Temp 98.3°F | Resp 16 | Ht 65.0 in | Wt 228.0 lb

## 2017-11-25 DIAGNOSIS — R609 Edema, unspecified: Secondary | ICD-10-CM

## 2017-11-25 DIAGNOSIS — I10 Essential (primary) hypertension: Secondary | ICD-10-CM | POA: Diagnosis not present

## 2017-11-25 DIAGNOSIS — M5432 Sciatica, left side: Secondary | ICD-10-CM | POA: Diagnosis not present

## 2017-11-25 DIAGNOSIS — Z23 Encounter for immunization: Secondary | ICD-10-CM

## 2017-11-25 MED ORDER — GABAPENTIN 400 MG PO CAPS: 800.0000 mg | ORAL_CAPSULE | Freq: Three times a day (TID) | ORAL | 3 refills | Status: DC

## 2017-11-25 NOTE — Progress Notes (Signed)
[facility] [address], [place], [place] [address] [phone]  Date:  11/25/2017   Name:  Emily Wood   DOB:  January 05, 1951   MRN:  [id number]  PCP:  Darreld Mclean, MD    Chief Complaint: Hypertension (follow up)   History of Present Illness:  Emily Wood is a 67 y.o. very pleasant female patient who presents with the following:  following up today History of lymphedema, CKD, DM, HTN Last seen here on 4/29 Here today with illness  CXR does not show lobar pneumonia Treat with azithromycin She will let me know if not better Dermatology consultation for issue with her ankle  Foot exam is due She is feeling much better from her recent bronchitis  She finished up her abx  She is seeing derm for her ankle issue on 5/20 Lab Results  Component Value Date   HGBA1C 7.2 (H) 09/16/2017   Pneumonia vaccine due- prevnar, will give today  BP Readings from Last 3 Encounters:  11/25/17 140/72  11/15/17 (!) 161/86  11/10/17 140/82   Wt Readings from Last 3 Encounters:  11/25/17 228 lb (103.4 kg)  11/15/17 224 lb (101.6 kg)  11/10/17 224 lb (101.6 kg)     Patient Active Problem List   Diagnosis Date Noted  . Lymphedema 01/25/2017  . Fibroid uterus 01/14/2017  . Hypertensive heart disease without heart failure 10/01/2016  . Chronic kidney disease, stage 3 ([place]) 09/21/2016  . Meralgia paresthetica of left side 08/27/2015  . Diabetes mellitus type 2, controlled ([place]) 08/02/2015  . Chronic venous insufficiency 06/28/2015  . Essential hypertension 02/15/2015  . Peripheral edema 02/15/2015  . Chronic knee pain 02/15/2015    Past Medical History:  Diagnosis Date  . Arthritis   . Diabetes mellitus without complication ([place])   . Hypertension     Past Surgical History:  Procedure Laterality Date  . CHOLECYSTECTOMY    . HERNIA REPAIR    . OTHER SURGICAL HISTORY     scar tissue removal from  bowels  . PARTIAL HYSTERECTOMY     Still has ovaries  . UTERINE FIBROID SURGERY      Social History   Tobacco Use  . Smoking status: Never Smoker  . Smokeless tobacco: Never Used  Substance Use Topics  . Alcohol use: No    Alcohol/week: 0.0 oz  . Drug use: No    Family History  Problem Relation Age of Onset  . Hyperlipidemia Mother   . Hyperlipidemia Maternal Aunt     No Known Allergies  Medication list has been reviewed and updated.  Current Outpatient Medications on File Prior to Visit  Medication Sig Dispense Refill  . Albuterol Sulfate (PROAIR RESPICLICK) 564 (90 Base) MCG/ACT AEPB Inhale 2 puffs into the lungs every 6 (six) hours as needed. 1 each 3  . Alpha-Lipoic Acid 300 MG CAPS Take 2 capsules by mouth daily.    Marland Kitchen amLODipine (NORVASC) 5 MG tablet Take 1 tablet (5 mg total) by mouth daily. 180 tablet 3  . aspirin 81 MG tablet Take 81 mg by mouth daily.    . beclomethasone (QVAR) 40 MCG/ACT inhaler Inhale 2 puffs into the lungs 2 (two) times daily. 1 Inhaler 6  . cloNIDine (CATAPRES) 0.2 MG tablet Take 1 tablet (0.2 mg total) by mouth 3 (three) times daily. 270 tablet 1  . fluticasone (FLONASE) 50 MCG/ACT nasal spray Place 2 sprays into both nostrils daily. 16 g  0  . gabapentin (NEURONTIN) 400 MG capsule Take 2 capsules by mouth 3 (three) times daily.   0  . hydrochlorothiazide (HYDRODIURIL) 25 MG tablet Take 1 tablet (25 mg total) by mouth daily. 90 tablet 3  . irbesartan (AVAPRO) 300 MG tablet   3  . metFORMIN (GLUCOPHAGE) 500 MG tablet Take 1 tablet (500 mg total) by mouth 2 (two) times daily with a meal. 180 tablet 2  . methimazole (TAPAZOLE) 5 MG tablet Take 1 tablet by mouth daily.    . metoprolol tartrate (LOPRESSOR) 50 MG tablet Take 1 tablet (50 mg total) by mouth 2 (two) times daily. 180 tablet 3  . montelukast (SINGULAIR) 10 MG tablet Take 1 tablet (10 mg total) by mouth at bedtime. 30 tablet 3  . OVER THE COUNTER MEDICATION Take 1 capsule by mouth daily.  ARTHROCEN 300 mg     . potassium chloride SA (K-DUR,KLOR-CON) 20 MEQ tablet Take 1 tablet (20 mEq total) by mouth daily. 90 tablet 3  . valsartan-hydrochlorothiazide (DIOVAN-HCT) 320-25 MG tablet   0   No current facility-administered medications on file prior to visit.     Review of Systems:  As per HPI- otherwise negative.   Physical Examination: Vitals:   11/25/17 1724  BP: 140/72  Pulse: 71  Resp: 16  Temp: 98.3 F (36.8 C)  SpO2: 97%   Vitals:   11/25/17 1724  Weight: 228 lb (103.4 kg)  Height: 5\' 5"  (1.651 m)   Body mass index is 37.94 kg/m. Ideal Body Weight: Weight in (lb) to have BMI = 25: 149.9  GEN: WDWN, NAD, Non-toxic, A & O x 3, obese, looks well today HEENT: Atraumatic, Normocephalic. Neck supple. No masses, No LAD. Ears and Nose: No external deformity. CV: RRR, No M/G/R. No JVD. No thrill. No extra heart sounds. PULM: CTA B, no wheezes, crackles, rhonchi. No retractions. No resp. distress. No accessory muscle use. EXTR: No c/c/ chronic bilateral LE edema is stable Foot exam done today  NEURO Normal gait.  PSYCH: Normally interactive. Conversant. Not depressed or anxious appearing.  Calm demeanor.    Assessment and Plan: Immunization due - Plan: Pneumococcal conjugate vaccine 13-valent IM  Essential hypertension  Peripheral edema  Sciatica, left side - Plan: gabapentin (NEURONTIN) 400 MG capsule  Following up today prevnar given She needs a refill of her gabapentin which is fine She has recovered from recent bronchitis Edema is stable   Meds ordered this encounter  Medications  . gabapentin (NEURONTIN) 400 MG capsule    Sig: Take 2 capsules (800 mg total) by mouth 3 (three) times daily.    Dispense:  540 capsule    Refill:  3   Asked her to come and see me in 3-4 months to check on her DM   Signed [name], MD

## 2017-11-25 NOTE — Patient Instructions (Addendum)
Good to see you today- please plan to see me in about 3-4 months to recheck on your diabetes  You got your 2nd pneumonia vaccine today Take care!

## 2017-12-01 DIAGNOSIS — E0500 Thyrotoxicosis with diffuse goiter without thyrotoxic crisis or storm: Secondary | ICD-10-CM | POA: Diagnosis not present

## 2017-12-10 ENCOUNTER — Other Ambulatory Visit: Payer: Self-pay | Admitting: Nurse Practitioner

## 2017-12-10 DIAGNOSIS — R0981 Nasal congestion: Secondary | ICD-10-CM

## 2018-01-05 ENCOUNTER — Ambulatory Visit (HOSPITAL_BASED_OUTPATIENT_CLINIC_OR_DEPARTMENT_OTHER)
Admission: RE | Admit: 2018-01-05 | Discharge: 2018-01-05 | Disposition: A | Discharge status: Home / Self Care | Payer: PPO | Source: Ambulatory Visit | Attending: Obstetrics and Gynecology | Admitting: Obstetrics and Gynecology

## 2018-01-05 DIAGNOSIS — M25559 Pain in unspecified hip: Secondary | ICD-10-CM | POA: Diagnosis not present

## 2018-01-05 DIAGNOSIS — Z1231 Encounter for screening mammogram for malignant neoplasm of breast: Secondary | ICD-10-CM | POA: Diagnosis not present

## 2018-01-05 DIAGNOSIS — Z13820 Encounter for screening for osteoporosis: Secondary | ICD-10-CM | POA: Diagnosis not present

## 2018-01-05 DIAGNOSIS — Z1239 Encounter for other screening for malignant neoplasm of breast: Secondary | ICD-10-CM

## 2018-01-05 DIAGNOSIS — Z780 Asymptomatic menopausal state: Secondary | ICD-10-CM | POA: Diagnosis not present

## 2018-01-05 DIAGNOSIS — Z8262 Family history of osteoporosis: Secondary | ICD-10-CM | POA: Insufficient documentation

## 2018-01-06 DIAGNOSIS — I8312 Varicose veins of left lower extremity with inflammation: Secondary | ICD-10-CM | POA: Diagnosis not present

## 2018-01-06 DIAGNOSIS — I872 Venous insufficiency (chronic) (peripheral): Secondary | ICD-10-CM | POA: Diagnosis not present

## 2018-01-06 DIAGNOSIS — M793 Panniculitis, unspecified: Secondary | ICD-10-CM | POA: Diagnosis not present

## 2018-01-06 DIAGNOSIS — I8311 Varicose veins of right lower extremity with inflammation: Secondary | ICD-10-CM | POA: Diagnosis not present

## 2018-01-10 ENCOUNTER — Encounter (INDEPENDENT_AMBULATORY_CARE_PROVIDER_SITE_OTHER): Payer: Self-pay

## 2018-01-11 ENCOUNTER — Other Ambulatory Visit: Payer: Self-pay | Admitting: Obstetrics and Gynecology

## 2018-01-11 DIAGNOSIS — R928 Other abnormal and inconclusive findings on diagnostic imaging of breast: Secondary | ICD-10-CM

## 2018-01-12 NOTE — Progress Notes (Signed)
For you, not me. :-)

## 2018-01-14 ENCOUNTER — Other Ambulatory Visit: Payer: Self-pay | Admitting: Obstetrics and Gynecology

## 2018-01-14 ENCOUNTER — Ambulatory Visit
Admission: RE | Admit: 2018-01-14 | Discharge: 2018-01-14 | Disposition: A | Discharge status: Home / Self Care | Payer: PPO | Source: Ambulatory Visit | Attending: Obstetrics and Gynecology | Admitting: Obstetrics and Gynecology

## 2018-01-14 DIAGNOSIS — R928 Other abnormal and inconclusive findings on diagnostic imaging of breast: Secondary | ICD-10-CM | POA: Diagnosis not present

## 2018-01-14 DIAGNOSIS — N6320 Unspecified lump in the left breast, unspecified quadrant: Secondary | ICD-10-CM

## 2018-01-18 ENCOUNTER — Other Ambulatory Visit: Payer: Self-pay | Admitting: Obstetrics and Gynecology

## 2018-01-27 ENCOUNTER — Other Ambulatory Visit: Payer: Self-pay | Admitting: Nurse Practitioner

## 2018-01-27 DIAGNOSIS — R0981 Nasal congestion: Secondary | ICD-10-CM

## 2018-01-30 ENCOUNTER — Other Ambulatory Visit: Payer: Self-pay | Admitting: Nurse Practitioner

## 2018-01-30 DIAGNOSIS — R0981 Nasal congestion: Secondary | ICD-10-CM

## 2018-02-04 DIAGNOSIS — E0500 Thyrotoxicosis with diffuse goiter without thyrotoxic crisis or storm: Secondary | ICD-10-CM | POA: Diagnosis not present

## 2018-02-08 DIAGNOSIS — E0500 Thyrotoxicosis with diffuse goiter without thyrotoxic crisis or storm: Secondary | ICD-10-CM | POA: Diagnosis not present

## 2018-03-10 DIAGNOSIS — H2513 Age-related nuclear cataract, bilateral: Secondary | ICD-10-CM | POA: Diagnosis not present

## 2018-04-13 ENCOUNTER — Other Ambulatory Visit: Payer: Self-pay | Admitting: Family Medicine

## 2018-04-13 DIAGNOSIS — E119 Type 2 diabetes mellitus without complications: Secondary | ICD-10-CM

## 2018-04-21 ENCOUNTER — Other Ambulatory Visit: Payer: Self-pay | Admitting: Family Medicine

## 2018-04-21 DIAGNOSIS — I10 Essential (primary) hypertension: Secondary | ICD-10-CM

## 2018-04-21 MED ORDER — IRBESARTAN 300 MG PO TABS: 300.0000 mg | ORAL_TABLET | Freq: Every day | ORAL | 3 refills | Status: DC

## 2018-04-21 NOTE — Telephone Encounter (Signed)
Requested medication (s) are due for refill today: yes  Requested medication (s) are on the active medication list: yes    Last refill: 10/17/17  3 refills  Future visit scheduled no  Notes to clinic: Historical Provider  Requested Prescriptions  Pending Prescriptions Disp Refills   irbesartan (AVAPRO) 300 MG tablet  3     Cardiovascular:  Angiotensin Receptor Blockers Failed - 04/21/2018  3:55 PM      Failed - Cr in normal range and within 180 days    Creatinine  Date Value Ref Range Status  10/15/2017 1.3 (A) 0.5 - 1.1 Final   Creat  Date Value Ref Range Status  08/26/2015 1.01 (H) 0.50 - 0.99 mg/dL Final   Creatinine, Ser  Date Value Ref Range Status  09/16/2017 1.14 0.40 - 1.20 mg/dL Final         Failed - K in normal range and within 180 days    Potassium  Date Value Ref Range Status  09/16/2017 3.6 3.5 - 5.1 mEq/L Final         Failed - Last BP in normal range    BP Readings from Last 1 Encounters:  11/25/17 140/72         Passed - Patient is not pregnant      Passed - Valid encounter within last 6 months    Recent Outpatient Visits          4 months ago Immunization due   [facility], Gay Filler, MD   5 months ago Body aches   [facility], Gay Filler, MD   5 months ago Sore throat and laryngitis   [facility], Charlene Brooke, NP   5 months ago Essential hypertension   [facility], Gay Filler, MD   6 months ago Essential hypertension   [facility], [place]

## 2018-04-21 NOTE — Telephone Encounter (Signed)
Copied from [facility] [phone] [id number]. Topic: Quick Communication - Other Results >> Apr 21, 2018  3:48 PM Yvette Rack wrote: Medication: irbesartan (AVAPRO) 300 MG tablet  Preferred Pharmacy: [facility] [place], [place] - [address] [place] Phone: [phone] Fax: [phone]

## 2018-05-09 DIAGNOSIS — E0500 Thyrotoxicosis with diffuse goiter without thyrotoxic crisis or storm: Secondary | ICD-10-CM | POA: Diagnosis not present

## 2018-05-16 DIAGNOSIS — E0500 Thyrotoxicosis with diffuse goiter without thyrotoxic crisis or storm: Secondary | ICD-10-CM | POA: Diagnosis not present

## 2018-05-23 ENCOUNTER — Other Ambulatory Visit: Payer: Self-pay | Admitting: Family Medicine

## 2018-05-23 DIAGNOSIS — I10 Essential (primary) hypertension: Secondary | ICD-10-CM

## 2018-07-18 ENCOUNTER — Ambulatory Visit
Admission: RE | Admit: 2018-07-18 | Discharge: 2018-07-18 | Disposition: A | Discharge status: Home / Self Care | Payer: PPO | Source: Ambulatory Visit | Attending: Obstetrics and Gynecology | Admitting: Obstetrics and Gynecology

## 2018-07-18 DIAGNOSIS — N6489 Other specified disorders of breast: Secondary | ICD-10-CM | POA: Diagnosis not present

## 2018-07-18 DIAGNOSIS — R928 Other abnormal and inconclusive findings on diagnostic imaging of breast: Secondary | ICD-10-CM | POA: Diagnosis not present

## 2018-07-18 DIAGNOSIS — N6320 Unspecified lump in the left breast, unspecified quadrant: Secondary | ICD-10-CM

## 2018-07-28 ENCOUNTER — Ambulatory Visit (INDEPENDENT_AMBULATORY_CARE_PROVIDER_SITE_OTHER): Payer: PPO | Admitting: Vascular Surgery

## 2018-08-01 ENCOUNTER — Ambulatory Visit (INDEPENDENT_AMBULATORY_CARE_PROVIDER_SITE_OTHER): Payer: Medicare Other | Admitting: Vascular Surgery

## 2018-08-01 ENCOUNTER — Encounter (INDEPENDENT_AMBULATORY_CARE_PROVIDER_SITE_OTHER): Payer: Self-pay | Admitting: Vascular Surgery

## 2018-08-01 VITALS — BP 171/78 | HR 55 | Resp 16 | Ht 65.0 in | Wt 244.0 lb

## 2018-08-01 DIAGNOSIS — I890 Lymphedema, not elsewhere classified: Secondary | ICD-10-CM | POA: Diagnosis not present

## 2018-08-01 DIAGNOSIS — I10 Essential (primary) hypertension: Secondary | ICD-10-CM

## 2018-08-01 DIAGNOSIS — E119 Type 2 diabetes mellitus without complications: Secondary | ICD-10-CM

## 2018-08-01 DIAGNOSIS — E1151 Type 2 diabetes mellitus with diabetic peripheral angiopathy without gangrene: Secondary | ICD-10-CM

## 2018-08-01 DIAGNOSIS — I872 Venous insufficiency (chronic) (peripheral): Secondary | ICD-10-CM

## 2018-08-01 NOTE — Progress Notes (Signed)
MRN : [id number]  Emily Wood is a 68 y.o. (1951-03-31) female who presents with chief complaint of  Chief Complaint  Patient presents with  . Follow-up  .  History of Present Illness:   The patient returns to the office for followup evaluation regarding leg swelling.  The swelling has persisted but with the lymph pump is much, much better controlled. The pain associated with swelling is essentially eliminated. There have not been any interval development of a ulcerations or wounds.  The patient denies problems with the pump, noting it is working well and the leggings are in good condition.  Since the previous visit the patient has been wearing graduated compression stockings and using the lymph pump on a routine basis and  has noted significant improvement in the lymphedema.   Patient stated the lymph pump has been a very positive factor in her care.   Current Meds  Medication Sig  . Albuterol Sulfate (PROAIR RESPICLICK) 409 (90 Base) MCG/ACT AEPB Inhale 2 puffs into the lungs every 6 (six) hours as needed.  . Alpha-Lipoic Acid 300 MG CAPS Take 2 capsules by mouth daily.  Marland Kitchen amLODipine (NORVASC) 5 MG tablet Take 1 tablet (5 mg total) by mouth daily.  Marland Kitchen aspirin 81 MG tablet Take 81 mg by mouth daily.  . cloNIDine (CATAPRES) 0.2 MG tablet TAKE 1 TABLET (0.2 MG TOTAL) BY MOUTH 3 (THREE) TIMES DAILY.  . fluticasone (FLONASE) 50 MCG/ACT nasal spray SPRAY 2 SPRAYS INTO EACH NOSTRIL EVERY DAY  . gabapentin (NEURONTIN) 400 MG capsule Take 2 capsules (800 mg total) by mouth 3 (three) times daily.  . hydrochlorothiazide (HYDRODIURIL) 25 MG tablet Take 1 tablet (25 mg total) by mouth daily.  . irbesartan (AVAPRO) 300 MG tablet Take 1 tablet (300 mg total) by mouth daily.  . metFORMIN (GLUCOPHAGE) 500 MG tablet TAKE 1 TABLET (500 MG TOTAL) BY MOUTH 2 (TWO) TIMES DAILY WITH A MEAL.  . metoprolol tartrate (LOPRESSOR) 50 MG tablet Take 1 tablet (50 mg total) by mouth 2 (two) times  daily.  . potassium chloride SA (K-DUR,KLOR-CON) 20 MEQ tablet Take 1 tablet (20 mEq total) by mouth daily.    Past Medical History:  Diagnosis Date  . Arthritis   . Diabetes mellitus without complication ([place])   . Hypertension     Past Surgical History:  Procedure Laterality Date  . CHOLECYSTECTOMY    . HERNIA REPAIR    . OTHER SURGICAL HISTORY     scar tissue removal from bowels  . PARTIAL HYSTERECTOMY     Still has ovaries  . UTERINE FIBROID SURGERY      Social History Social History   Tobacco Use  . Smoking status: Never Smoker  . Smokeless tobacco: Never Used  Substance Use Topics  . Alcohol use: No    Alcohol/week: 0.0 standard drinks  . Drug use: No    Family History Family History  Problem Relation Age of Onset  . Hyperlipidemia Mother   . Hyperlipidemia Maternal Aunt   . Breast cancer Cousin     No Known Allergies   REVIEW OF SYSTEMS (Negative unless checked)  Constitutional: [] Weight loss  [] Fever  [] Chills Cardiac: [] Chest pain   [] Chest pressure   [] Palpitations   [] Shortness of breath when laying flat   [] Shortness of breath with exertion. Vascular:  [] Pain in legs with walking   [] Pain in legs at rest  [] History of DVT   [] Phlebitis   [x] Swelling in legs   [] Varicose  veins   [] Non-healing ulcers Pulmonary:   [] Uses home oxygen   [] Productive cough   [] Hemoptysis   [] Wheeze  [] COPD   [] Asthma Neurologic:  [] Dizziness   [] Seizures   [] History of stroke   [] History of TIA  [] Aphasia   [] Vissual changes   [] Weakness or numbness in arm   [] Weakness or numbness in leg Musculoskeletal:   [] Joint swelling   [x] Joint pain   [] Low back pain Hematologic:  [] Easy bruising  [] Easy bleeding   [] Hypercoagulable state   [] Anemic Gastrointestinal:  [] Diarrhea   [] Vomiting  [] Gastroesophageal reflux/heartburn   [] Difficulty swallowing. Genitourinary:  [] Chronic kidney disease   [] Difficult urination  [] Frequent urination   [] Blood in urine Skin:  [] Rashes    [] Ulcers  Psychological:  [] History of anxiety   []  History of major depression.  Physical Examination  Vitals:   08/01/18 0954  Resp: 16  Weight: 244 lb (110.7 kg)  Height: 5\' 5"  (1.651 m)   Body mass index is 40.6 kg/m. Gen: WD/WN, NAD Head: [place]/AT, No temporalis wasting.  Ear/Nose/Throat: Hearing grossly intact, nares w/o erythema or drainage Eyes: PER, EOMI, sclera nonicteric.  Neck: Supple, no large masses.   Pulmonary:  Good air movement, no audible wheezing bilaterally, no use of accessory muscles.  Cardiac: RRR, no JVD Vascular: scattered varicosities present bilaterally.  Mild venous stasis changes to the legs bilaterally.  3+ soft pitting edema Vessel Right Left  Radial Palpable Palpable  PT Palpable Palpable  DP Palpable Palpable  Gastrointestinal: Non-distended. No guarding/no peritoneal signs.  Musculoskeletal: M/S 5/5 throughout.  No deformity or atrophy.  Neurologic: CN 2-12 intact. Symmetrical.  Speech is fluent. Motor exam as listed above. Psychiatric: Judgment intact, Mood & affect appropriate for pt's clinical situation. Dermatologic: mild venous rashes no ulcers noted.  No changes consistent with cellulitis. Lymph : No lichenification or skin changes of chronic lymphedema.  CBC Lab Results  Component Value Date   WBC 5.7 10/15/2017   HGB 11.7 (A) 10/15/2017   HCT 36 10/15/2017   MCV 85.6 09/16/2017   PLT 380 10/15/2017    BMET    Component Value Date/Time   NA 137 09/16/2017 1827   K 3.6 09/16/2017 1827   CL 101 09/16/2017 1827   CO2 27 09/16/2017 1827   GLUCOSE 103 (H) 09/16/2017 1827   BUN 30 (A) 10/15/2017   CREATININE 1.3 (A) 10/15/2017   CREATININE 1.14 09/16/2017 1827   CREATININE 1.01 (H) 08/26/2015 1203   CALCIUM 9.7 09/16/2017 1827   CrCl cannot be calculated (Patient's most recent lab result is older than the maximum 21 days allowed.).  COAG No results found for: INR, PROTIME  Radiology US Breast Ltd Uni Left Inc  Axilla  Result Date: 07/18/2018 CLINICAL DATA:  Six-month follow-up of probably benign left breast 5 o'clock 6 mm nodule. EXAM: DIGITAL DIAGNOSTIC LEFT MAMMOGRAM WITH CAD AND TOMO ULTRASOUND LEFT BREAST COMPARISON:  Previous exam(s). ACR Breast Density Category b: There are scattered areas of fibroglandular density. FINDINGS: Mammographically, there are no suspicious masses, areas of architectural distortion or microcalcifications in the left breast. The previously demonstrated 6 mm circumscribed nodule in the left 5 o'clock breast, anterior to middle depth, has resolved. Mammographic images were processed with CAD. On physical exam, no suspicious masses are palpated. Targeted ultrasound is performed, showing no suspicious masses or shadowing lesions. IMPRESSION: No mammographic sonographic evidence of malignancy in the left breast. RECOMMENDATION: Bilateral screening mammogram June 2020. I have discussed the findings and recommendations with the patient.  Results were also provided in writing at the conclusion of the visit. If applicable, a reminder letter will be sent to the patient regarding the next appointment. BI-RADS CATEGORY  1: Negative. Electronically Signed   By: Fidela Salisbury M.D.   On: 07/18/2018 09:48   Mm Diag Breast Tomo Uni Left  Result Date: 07/18/2018 CLINICAL DATA:  Six-month follow-up of probably benign left breast 5 o'clock 6 mm nodule. EXAM: DIGITAL DIAGNOSTIC LEFT MAMMOGRAM WITH CAD AND TOMO ULTRASOUND LEFT BREAST COMPARISON:  Previous exam(s). ACR Breast Density Category b: There are scattered areas of fibroglandular density. FINDINGS: Mammographically, there are no suspicious masses, areas of architectural distortion or microcalcifications in the left breast. The previously demonstrated 6 mm circumscribed nodule in the left 5 o'clock breast, anterior to middle depth, has resolved. Mammographic images were processed with CAD. On physical exam, no suspicious masses are palpated.  Targeted ultrasound is performed, showing no suspicious masses or shadowing lesions. IMPRESSION: No mammographic sonographic evidence of malignancy in the left breast. RECOMMENDATION: Bilateral screening mammogram June 2020. I have discussed the findings and recommendations with the patient. Results were also provided in writing at the conclusion of the visit. If applicable, a reminder letter will be sent to the patient regarding the next appointment. BI-RADS CATEGORY  1: Negative. Electronically Signed   By: Fidela Salisbury M.D.   On: 07/18/2018 09:48    Assessment/Plan 1. Lymphedema  No surgery or intervention at this point in time.    I have reviewed my discussion with the patient regarding lymphedema and why it  causes symptoms.  Patient will continue wearing graduated compression stockings class 1 (20-30 mmHg) on a daily basis a prescription was given. The patient is reminded to put the stockings on first thing in the morning and removing them in the evening. The patient is instructed specifically not to sleep in the stockings.   In addition, behavioral modification throughout the day will be continued.  This will include frequent elevation (such as in a recliner), use of over the counter pain medications as needed and exercise such as walking.  I have reviewed systemic causes for chronic edema such as liver, kidney and cardiac etiologies and there does not appear to be any significant changes in these organ systems over the past year.  The patient is under the impression that these organ systems are all stable and unchanged.    The patient will continue aggressive use of the  lymph pump.  This will continue to improve the edema control and prevent sequela such as ulcers and infections.   The patient will follow-up with me on an annual basis.    2. Chronic venous insufficiency  No surgery or intervention at this point in time.    I have reviewed my discussion with the patient regarding  lymphedema and why it  causes symptoms.  Patient will continue wearing graduated compression stockings class 1 (20-30 mmHg) on a daily basis a prescription was given. The patient is reminded to put the stockings on first thing in the morning and removing them in the evening. The patient is instructed specifically not to sleep in the stockings.   In addition, behavioral modification throughout the day will be continued.  This will include frequent elevation (such as in a recliner), use of over the counter pain medications as needed and exercise such as walking.  I have reviewed systemic causes for chronic edema such as liver, kidney and cardiac etiologies and there does not appear to be  any significant changes in these organ systems over the past year.  The patient is under the impression that these organ systems are all stable and unchanged.    The patient will continue aggressive use of the  lymph pump.  This will continue to improve the edema control and prevent sequela such as ulcers and infections.   The patient will follow-up with me on an annual basis.    3. Essential hypertension Continue antihypertensive medications as already ordered, these medications have been reviewed and there are no changes at this time.   4. Controlled type 2 diabetes mellitus without complication, without long-term current use of insulin ([place]) Continue hypoglycemic medications as already ordered, these medications have been reviewed and there are no changes at this time.  Hgb A1C to be monitored as already arranged by primary service     Hortencia Pilar, MD  08/01/2018 9:56 AM

## 2018-08-03 ENCOUNTER — Other Ambulatory Visit: Payer: Self-pay | Admitting: Family Medicine

## 2018-08-03 DIAGNOSIS — I10 Essential (primary) hypertension: Secondary | ICD-10-CM

## 2018-08-05 ENCOUNTER — Encounter (INDEPENDENT_AMBULATORY_CARE_PROVIDER_SITE_OTHER): Payer: Self-pay | Admitting: Vascular Surgery

## 2018-08-09 ENCOUNTER — Encounter: Payer: Self-pay | Admitting: Family

## 2018-08-09 ENCOUNTER — Ambulatory Visit (INDEPENDENT_AMBULATORY_CARE_PROVIDER_SITE_OTHER): Payer: Medicare Other | Admitting: Family

## 2018-08-09 VITALS — BP 165/65 | HR 62 | Temp 98.4°F | Resp 16 | Ht 65.0 in | Wt 246.0 lb

## 2018-08-09 DIAGNOSIS — I10 Essential (primary) hypertension: Secondary | ICD-10-CM | POA: Diagnosis not present

## 2018-08-09 DIAGNOSIS — J029 Acute pharyngitis, unspecified: Secondary | ICD-10-CM | POA: Diagnosis not present

## 2018-08-09 DIAGNOSIS — J069 Acute upper respiratory infection, unspecified: Secondary | ICD-10-CM | POA: Diagnosis not present

## 2018-08-09 DIAGNOSIS — B9789 Other viral agents as the cause of diseases classified elsewhere: Secondary | ICD-10-CM | POA: Diagnosis not present

## 2018-08-09 DIAGNOSIS — R52 Pain, unspecified: Secondary | ICD-10-CM | POA: Diagnosis not present

## 2018-08-09 LAB — POC INFLUENZA A&B (BINAX/QUICKVUE)
Influenza A, POC: NEGATIVE
Influenza B, POC: NEGATIVE

## 2018-08-09 LAB — POCT RAPID STREP A (OFFICE): Rapid Strep A Screen: NEGATIVE

## 2018-08-09 MED ORDER — HYDROCODONE-HOMATROPINE 5-1.5 MG/5ML PO SYRP: 5.0000 mL | ORAL_SOLUTION | Freq: Three times a day (TID) | ORAL | 0 refills | Status: DC | PRN

## 2018-08-09 NOTE — Progress Notes (Signed)
Subjective:    Patient ID: Emily Wood, female    DOB: 26-Dec-1950, 68 y.o.   MRN: [id number]  HPI   Patient is a 68 yr old female who presents today with c/o sore throat and body aches.  Symptoms began on 12/19.  Had hydrocodone/homotropine on hand which does not seem to be helping that much. Started with sore throat on Sunday, then developed sneezing, cough which is productive at times. Denies significant nasal congestion.  She reports some mild generalized body aches and fatigue. "I just feel like a ragdoll and want to go home and go to bed."   HTN- reports that blood pressure is "always up." Reports that she is sometimes skipping or forgetting her doses.  BP Readings from Last 3 Encounters:  08/09/18 (!) 165/65  08/01/18 (!) 171/78  11/25/17 140/72    Review of Systems    see HPI  Past Medical History:  Diagnosis Date  . Arthritis   . Diabetes mellitus without complication ([place])   . Hypertension      Social History   Socioeconomic History  . Marital status: Married    Spouse name: [name]  . Number of children: 0  . Years of education: 95  . Highest education level: Not on file  Occupational History  . Occupation: Therapist, art  Social Needs  . Financial resource strain: Not on file  . Food insecurity:    Worry: Not on file    Inability: Not on file  . Transportation needs:    Medical: Not on file    Non-medical: Not on file  Tobacco Use  . Smoking status: Never Smoker  . Smokeless tobacco: Never Used  Substance and Sexual Activity  . Alcohol use: No    Alcohol/week: 0.0 standard drinks  . Drug use: No  . Sexual activity: Not Currently    Birth control/protection: Post-menopausal  Lifestyle  . Physical activity:    Days per week: Not on file    Minutes per session: Not on file  . Stress: Not on file  Relationships  . Social connections:    Talks on phone: Not on file    Gets together: Not on file    Attends religious service: Not on file   Active member of club or organization: Not on file    Attends meetings of clubs or organizations: Not on file    Relationship status: Not on file  . Intimate partner violence:    Fear of current or ex partner: Not on file    Emotionally abused: Not on file    Physically abused: Not on file    Forced sexual activity: Not on file  Other Topics Concern  . Not on file  Social History Narrative   Lives with husband   Caffeine use: 16oz coffee per day    Past Surgical History:  Procedure Laterality Date  . CHOLECYSTECTOMY    . HERNIA REPAIR    . OTHER SURGICAL HISTORY     scar tissue removal from bowels  . PARTIAL HYSTERECTOMY     Still has ovaries  . UTERINE FIBROID SURGERY      Family History  Problem Relation Age of Onset  . Hyperlipidemia Mother   . Hyperlipidemia Maternal Aunt   . Breast cancer Cousin     No Known Allergies  Current Outpatient Medications on File Prior to Visit  Medication Sig Dispense Refill  . Albuterol Sulfate (PROAIR RESPICLICK) 782 (90 Base) MCG/ACT AEPB Inhale 2 puffs into  the lungs every 6 (six) hours as needed. 1 each 3  . Alpha-Lipoic Acid 300 MG CAPS Take 2 capsules by mouth daily.    Marland Kitchen amLODipine (NORVASC) 5 MG tablet Take 1 tablet (5 mg total) by mouth daily. 180 tablet 3  . aspirin 81 MG tablet Take 81 mg by mouth daily.    . cloNIDine (CATAPRES) 0.2 MG tablet TAKE 1 TABLET (0.2 MG TOTAL) BY MOUTH 3 (THREE) TIMES DAILY. 270 tablet 1  . fluticasone (FLONASE) 50 MCG/ACT nasal spray SPRAY 2 SPRAYS INTO EACH NOSTRIL EVERY DAY 48 g 3  . gabapentin (NEURONTIN) 400 MG capsule Take 2 capsules (800 mg total) by mouth 3 (three) times daily. 540 capsule 3  . hydrochlorothiazide (HYDRODIURIL) 25 MG tablet Take 1 tablet (25 mg total) by mouth daily. 90 tablet 3  . irbesartan (AVAPRO) 300 MG tablet Take 1 tablet (300 mg total) by mouth daily. 90 tablet 3  . metFORMIN (GLUCOPHAGE) 500 MG tablet TAKE 1 TABLET (500 MG TOTAL) BY MOUTH 2 (TWO) TIMES DAILY WITH  A MEAL. 180 tablet 1  . metoprolol tartrate (LOPRESSOR) 50 MG tablet TAKE 1 TABLET BY MOUTH TWICE DAILY 180 tablet 1  . potassium chloride SA (K-DUR,KLOR-CON) 20 MEQ tablet Take 1 tablet (20 mEq total) by mouth daily. 90 tablet 3  . methimazole (TAPAZOLE) 5 MG tablet Take 1 tablet by mouth daily.     No current facility-administered medications on file prior to visit.     BP (!) 165/65 (BP Location: Right Arm, Patient Position: Sitting, Cuff Size: Large)   Pulse 62   Temp 98.4 F (36.9 C) (Oral)   Resp 16   Ht 5\' 5"  (1.651 m)   Wt 246 lb (111.6 kg)   SpO2 98%   BMI 40.94 kg/m    Objective:   Physical Exam Constitutional:      Appearance: She is well-developed.  HENT:     Right Ear: Tympanic membrane and ear canal normal.     Left Ear: Tympanic membrane and ear canal normal.     Nose: Congestion present.     Mouth/Throat:     Pharynx: No oropharyngeal exudate, posterior oropharyngeal erythema or uvula swelling.  Neck:     Musculoskeletal: Neck supple.     Thyroid: No thyromegaly.  Cardiovascular:     Rate and Rhythm: Normal rate and regular rhythm.     Heart sounds: Normal heart sounds. No murmur.  Pulmonary:     Effort: Pulmonary effort is normal. No respiratory distress.     Breath sounds: Normal breath sounds. No wheezing.  Skin:    General: Skin is warm and dry.  Neurological:     Mental Status: She is alert and oriented to person, place, and time.  Psychiatric:        Behavior: Behavior normal.        Thought Content: Thought content normal.        Judgment: Judgment normal.           Assessment & Plan:  Viral URI with cough- rapid flu and strep are both negative.  Pt is advised as follows:  For nasal congestion you may use nasal saline spray, mucinex and/or coriciden HBP. For cough you may continue to use the hydrocodone cough syrup prn.  Call if symptoms worsen or if not improved in 3-4 days.  HTN-  BP uncontrolled. Reinforced importance of med  compliance. Advised follow up with PCP in 1 month for BP recheck.

## 2018-08-09 NOTE — Patient Instructions (Signed)
For nasal congestion you may use nasal saline spray, mucinex and/or coriciden HBP. For cough you may continue to use the hydrocodone cough syrup prn.  Call if symptoms worsen or if not improved in 3-4 days. Work on taking your blood pressure medication regularly.

## 2018-08-22 ENCOUNTER — Other Ambulatory Visit: Payer: Self-pay | Admitting: Family Medicine

## 2018-08-22 MED ORDER — HYDROCODONE-HOMATROPINE 5-1.5 MG/5ML PO SYRP: 5.0000 mL | ORAL_SOLUTION | Freq: Three times a day (TID) | ORAL | 0 refills | Status: DC | PRN

## 2018-08-22 NOTE — Telephone Encounter (Signed)
Last seen here by [facility] for a UTI on 1/21 Ok to refill 60 ml only

## 2018-08-22 NOTE — Telephone Encounter (Signed)
Copied from [place] [id number]. Topic: Quick Communication - Rx Refill/Question >> Aug 22, 2018  2:54 PM Carolyn Stare wrote: Medication   HYDROcodone-homatropine (HYCODAN) 5-1.5 MG/5ML syrup  Preferred Pharmacy   [facility]   Agent: Please be advised that RX refills may take up to 3 business days. We ask that you follow-up with your pharmacy.

## 2018-08-24 DIAGNOSIS — E0500 Thyrotoxicosis with diffuse goiter without thyrotoxic crisis or storm: Secondary | ICD-10-CM | POA: Diagnosis not present

## 2018-10-14 ENCOUNTER — Other Ambulatory Visit: Payer: Self-pay | Admitting: Medical

## 2018-10-21 ENCOUNTER — Other Ambulatory Visit: Payer: Self-pay | Admitting: Family Medicine

## 2018-10-21 DIAGNOSIS — I10 Essential (primary) hypertension: Secondary | ICD-10-CM

## 2018-10-21 DIAGNOSIS — R609 Edema, unspecified: Secondary | ICD-10-CM

## 2018-10-31 ENCOUNTER — Encounter: Payer: Self-pay | Admitting: Family Medicine

## 2018-10-31 ENCOUNTER — Other Ambulatory Visit: Payer: Self-pay

## 2018-10-31 ENCOUNTER — Ambulatory Visit (INDEPENDENT_AMBULATORY_CARE_PROVIDER_SITE_OTHER): Payer: Medicare Other | Admitting: Family Medicine

## 2018-10-31 DIAGNOSIS — I10 Essential (primary) hypertension: Secondary | ICD-10-CM | POA: Diagnosis not present

## 2018-10-31 DIAGNOSIS — S70369A Insect bite (nonvenomous), unspecified thigh, initial encounter: Secondary | ICD-10-CM

## 2018-10-31 DIAGNOSIS — E119 Type 2 diabetes mellitus without complications: Secondary | ICD-10-CM | POA: Diagnosis not present

## 2018-10-31 DIAGNOSIS — W57XXXA Bitten or stung by nonvenomous insect and other nonvenomous arthropods, initial encounter: Secondary | ICD-10-CM

## 2018-10-31 NOTE — Patient Instructions (Addendum)
Your BP has been a bit high- please increase your amlodipine to 7.5 mg (1.5 pills).  Please monitor your blood pressure at home.  Would like to have your pressure less than 135-140/85.  Please let me know if you are above this goal  Please come and see me as soon as you are able to check on your diabetes/ labs.  In the meantime, please do work on diet and exercise so we can have your A1c look as good as possible  As we discussed, most tick bites do not lead to any serious problems.  Please do look up the Lyme disease rash online, if you develop this please let me know.  Also watch out for any sign of [facility] spotted fever, which you can include a rash on the hands and feet, and also fever  Please do talk to your GYN about ovarian cancer risk when practical to do so   Take care, I will look forward to seeing you soon

## 2018-10-31 NOTE — Progress Notes (Signed)
[place] at [facility] [address] [place], [place] [address] [phone]  Date:  10/31/2018   Name:  Emily Wood   DOB:  1951-03-27   MRN:  [id number]  PCP:  Darreld Mclean, MD    Chief Complaint: No chief complaint on file.   History of Present Illness:  Emily Wood is a 68 y.o. very pleasant female patient who presents with the following:  Virtual visit today due to COVID-19 outbreak Pt was unable to get her webex to work, so we had to do a phone visit.  Pt consented to phone visit  Patient location is home Provider location is office Patient identity confirmed with name and date of birth  I last saw in the office about 1 year ago.  She has history of lymphedema, chronic kidney disease, diabetes, hypertension, Graves hyperthyroidism She does see endocrinology and had a visit with Dr. Gabriel Carina just in February  However she is just managing her thyroid- not her DM  She is not checking her glucose at home  BP at endocrinology recently 148/90 She has gained some weight- she has been afraid to get on the scale Her sister died last 03/09/23 and this has been really hard on her- she is going to therapy but had to stop temporarily due to COVID 19 pandemic  BP Readings from Last 3 Encounters:  08/09/18 (!) 165/65  08/01/18 (!) 171/78  11/25/17 140/72   She does not check home BP but she does have a cuff which she could start using In March she notes that she got bitten by a tick on her inner thigh and she still has a bump there Went over sign and sx of RMSF and erythema migrans rash- she has neither of these  reassured that most tick bites are not harmful, she plans to send me a photo of her bite   Lab Results  Component Value Date   HGBA1C 7.2 (H) 09/16/2017    Went over current meds.  She is not sure if she might need any refills- she plans to have [facility] as [facility] come due  Asa  81 Amlodipine 5 Neurontin hctz irbesartan 300 Metformin 500 BID Lopressor 50 BID Clonidine tid   Patient Active Problem List   Diagnosis Date Noted  . Lymphedema 01/25/2017  . Fibroid uterus 01/14/2017  . Hypertensive heart disease without heart failure 10/01/2016  . Chronic kidney disease, stage 3 ([place]) 09/21/2016  . Meralgia paresthetica of left side 08/27/2015  . Diabetes mellitus type 2, controlled ([place]) 08/02/2015  . Chronic venous insufficiency 06/28/2015  . Essential hypertension 02/15/2015  . Peripheral edema 02/15/2015  . Chronic knee pain 02/15/2015    Past Medical History:  Diagnosis Date  . Arthritis   . Diabetes mellitus without complication ([place])   . Hypertension     Past Surgical History:  Procedure Laterality Date  . CHOLECYSTECTOMY    . HERNIA REPAIR    . OTHER SURGICAL HISTORY     scar tissue removal from bowels  . PARTIAL HYSTERECTOMY     Still has ovaries  . UTERINE FIBROID SURGERY      Social History   Tobacco Use  . Smoking status: Never Smoker  . Smokeless tobacco: Never Used  Substance Use Topics  . Alcohol use: No    Alcohol/week: 0.0 standard drinks  . Drug use: No    Family History  Problem  Relation Age of Onset  . Hyperlipidemia Mother   . Hyperlipidemia Maternal Aunt   . Breast cancer Cousin     No Known Allergies  Medication list has been reviewed and updated.  Current Outpatient Medications on File Prior to Visit  Medication Sig Dispense Refill  . Albuterol Sulfate (PROAIR RESPICLICK) 623 (90 Base) MCG/ACT AEPB Inhale 2 puffs into the lungs every 6 (six) hours as needed. 1 each 3  . Alpha-Lipoic Acid 300 MG CAPS Take 2 capsules by mouth daily.    Marland Kitchen amLODipine (NORVASC) 5 MG tablet TAKE 1 TABLET BY MOUTH EVERY DAY 90 tablet 7  . aspirin 81 MG tablet Take 81 mg by mouth daily.    . cloNIDine (CATAPRES) 0.2 MG tablet TAKE 1 TABLET (0.2 MG TOTAL) BY MOUTH 3 (THREE) TIMES DAILY. 270 tablet 1  . fluticasone (FLONASE) 50  MCG/ACT nasal spray SPRAY 2 SPRAYS INTO EACH NOSTRIL EVERY DAY 48 g 3  . gabapentin (NEURONTIN) 400 MG capsule Take 2 capsules (800 mg total) by mouth 3 (three) times daily. 540 capsule 3  . hydrochlorothiazide (HYDRODIURIL) 25 MG tablet TAKE 1 TABLET BY MOUTH EVERY DAY 30 tablet 0  . HYDROcodone-homatropine (HYCODAN) 5-1.5 MG/5ML syrup Take 5 mLs by mouth every 8 (eight) hours as needed for cough. 60 mL 0  . irbesartan (AVAPRO) 300 MG tablet Take 1 tablet (300 mg total) by mouth daily. 90 tablet 3  . KLOR-CON M20 20 MEQ tablet TAKE 1 TABLET BY MOUTH EVERY DAY 30 tablet 0  . metFORMIN (GLUCOPHAGE) 500 MG tablet TAKE 1 TABLET (500 MG TOTAL) BY MOUTH 2 (TWO) TIMES DAILY WITH A MEAL. 180 tablet 1  . methimazole (TAPAZOLE) 5 MG tablet Take 1 tablet by mouth daily.    . metoprolol tartrate (LOPRESSOR) 50 MG tablet TAKE 1 TABLET BY MOUTH TWICE DAILY 180 tablet 1   No current facility-administered medications on file prior to visit.     Review of Systems:  As per HPI- otherwise negative. No fever or chills  Physical Examination: There were no vitals filed for this visit. There were no vitals filed for this visit. There is no height or weight on file to calculate BMI. Ideal Body Weight:    We were unable to get video to work today Spoke to pt on the phone - she sounds well, her normal self No cough or wheezing is noted  Assessment and Plan: Essential hypertension  Tick bite, initial encounter  Controlled type 2 diabetes mellitus without complication, without long-term current use of insulin ([place])  Did phone visit today for this patient- she is well overdue for labs.  Offered to have her come in and do labs but she prefers to wait until pandemic is resolved- I asked her to come in and see me as soon as possible.  In the meantime she will work on her diet and exercise in hopes of getting her weight back down She notes that her sister died of ovarian cancer and she plans to discuss this  with her GYN provider Her blood pressure has been high in her last several visits.  I asked her to increase her amlodipine to 7.5 mg I spoke to this patient for 23 minutes on the phone today  Summary of our discussion follows, and was emailed to patient Your BP has been a bit high- please increase your amlodipine to 7.5 mg (1.5 pills).  Please monitor your blood pressure at home.  Would like to have your pressure less  than 135-140/85.  Please let me know if you are above this goal  Please come and see me as soon as you are able to check on your diabetes/ labs.  In the meantime, please do work on diet and exercise so we can have your A1c look as good as possible  As we discussed, most tick bites do not lead to any serious problems.  Please do look up the Lyme disease rash online, if you develop this please let me know.  Also watch out for any sign of [facility] spotted fever, which you can include a rash on the hands and feet, and also fever  Please do talk to your GYN about ovarian cancer risk when practical to do so   Take care, I will look forward to seeing you soon  Signed [name], MD

## 2018-11-09 ENCOUNTER — Other Ambulatory Visit: Payer: Self-pay | Admitting: Family Medicine

## 2018-11-09 DIAGNOSIS — I10 Essential (primary) hypertension: Secondary | ICD-10-CM

## 2018-11-18 ENCOUNTER — Other Ambulatory Visit: Payer: Self-pay

## 2018-11-18 DIAGNOSIS — R609 Edema, unspecified: Secondary | ICD-10-CM

## 2018-11-18 DIAGNOSIS — M5432 Sciatica, left side: Secondary | ICD-10-CM

## 2018-11-18 DIAGNOSIS — E119 Type 2 diabetes mellitus without complications: Secondary | ICD-10-CM

## 2018-11-18 DIAGNOSIS — I10 Essential (primary) hypertension: Secondary | ICD-10-CM

## 2018-11-18 MED ORDER — GABAPENTIN 400 MG PO CAPS: 800.0000 mg | ORAL_CAPSULE | Freq: Three times a day (TID) | ORAL | 3 refills | Status: DC

## 2018-11-18 MED ORDER — METFORMIN HCL 500 MG PO TABS: 500.0000 mg | ORAL_TABLET | Freq: Two times a day (BID) | ORAL | 1 refills | Status: DC

## 2018-11-18 MED ORDER — POTASSIUM CHLORIDE CRYS ER 20 MEQ PO TBCR: 20.0000 meq | EXTENDED_RELEASE_TABLET | Freq: Every day | ORAL | 1 refills | Status: DC

## 2018-11-18 MED ORDER — CLONIDINE HCL 0.2 MG PO TABS: 0.2000 mg | ORAL_TABLET | Freq: Three times a day (TID) | ORAL | 1 refills | Status: DC

## 2018-11-18 MED ORDER — METOPROLOL TARTRATE 50 MG PO TABS: 50.0000 mg | ORAL_TABLET | Freq: Two times a day (BID) | ORAL | 1 refills | Status: DC

## 2018-11-18 MED ORDER — IRBESARTAN 300 MG PO TABS: 300.0000 mg | ORAL_TABLET | Freq: Every day | ORAL | 1 refills | Status: DC

## 2018-11-18 MED ORDER — HYDROCHLOROTHIAZIDE 25 MG PO TABS: 25.0000 mg | ORAL_TABLET | Freq: Every day | ORAL | 1 refills | Status: DC

## 2018-11-23 ENCOUNTER — Telehealth: Payer: Self-pay | Admitting: Family Medicine

## 2018-11-23 ENCOUNTER — Other Ambulatory Visit: Payer: Self-pay | Admitting: Family Medicine

## 2018-11-23 DIAGNOSIS — I10 Essential (primary) hypertension: Secondary | ICD-10-CM

## 2018-11-23 DIAGNOSIS — R609 Edema, unspecified: Secondary | ICD-10-CM

## 2018-11-23 NOTE — Telephone Encounter (Signed)
Called patient to schedule AWV. Patient did not answer. Will try to call back at a later time.

## 2018-11-30 ENCOUNTER — Other Ambulatory Visit: Payer: Self-pay | Admitting: Family Medicine

## 2018-11-30 DIAGNOSIS — E119 Type 2 diabetes mellitus without complications: Secondary | ICD-10-CM

## 2018-12-03 ENCOUNTER — Other Ambulatory Visit: Payer: Self-pay | Admitting: Family Medicine

## 2018-12-03 DIAGNOSIS — M5432 Sciatica, left side: Secondary | ICD-10-CM

## 2019-01-17 DIAGNOSIS — E0500 Thyrotoxicosis with diffuse goiter without thyrotoxic crisis or storm: Secondary | ICD-10-CM | POA: Diagnosis not present

## 2019-01-24 DIAGNOSIS — E0500 Thyrotoxicosis with diffuse goiter without thyrotoxic crisis or storm: Secondary | ICD-10-CM | POA: Diagnosis not present

## 2019-01-27 ENCOUNTER — Encounter: Payer: Self-pay | Admitting: Family Medicine

## 2019-03-08 ENCOUNTER — Other Ambulatory Visit: Payer: Self-pay | Admitting: *Deleted

## 2019-03-08 MED ORDER — AMLODIPINE BESYLATE 5 MG PO TABS: 5.0000 mg | ORAL_TABLET | Freq: Every day | ORAL | 0 refills | Status: DC

## 2019-04-26 ENCOUNTER — Other Ambulatory Visit: Payer: Self-pay | Admitting: Family Medicine

## 2019-04-26 DIAGNOSIS — E119 Type 2 diabetes mellitus without complications: Secondary | ICD-10-CM

## 2019-04-26 DIAGNOSIS — R609 Edema, unspecified: Secondary | ICD-10-CM

## 2019-04-26 DIAGNOSIS — I10 Essential (primary) hypertension: Secondary | ICD-10-CM

## 2019-05-01 ENCOUNTER — Other Ambulatory Visit: Payer: Self-pay | Admitting: Obstetrics and Gynecology

## 2019-05-01 DIAGNOSIS — Z1231 Encounter for screening mammogram for malignant neoplasm of breast: Secondary | ICD-10-CM

## 2019-06-12 DIAGNOSIS — E119 Type 2 diabetes mellitus without complications: Secondary | ICD-10-CM | POA: Diagnosis not present

## 2019-06-12 LAB — HM DIABETES EYE EXAM

## 2019-06-13 ENCOUNTER — Other Ambulatory Visit: Payer: Self-pay | Admitting: Family Medicine

## 2019-06-14 ENCOUNTER — Other Ambulatory Visit: Payer: Self-pay

## 2019-06-14 ENCOUNTER — Ambulatory Visit
Admission: RE | Admit: 2019-06-14 | Discharge: 2019-06-14 | Disposition: A | Discharge status: Home / Self Care | Payer: Medicare Other | Source: Ambulatory Visit | Attending: Obstetrics and Gynecology | Admitting: Obstetrics and Gynecology

## 2019-06-14 DIAGNOSIS — Z1231 Encounter for screening mammogram for malignant neoplasm of breast: Secondary | ICD-10-CM | POA: Diagnosis not present

## 2019-06-21 DIAGNOSIS — E0500 Thyrotoxicosis with diffuse goiter without thyrotoxic crisis or storm: Secondary | ICD-10-CM | POA: Diagnosis not present

## 2019-06-28 DIAGNOSIS — E0500 Thyrotoxicosis with diffuse goiter without thyrotoxic crisis or storm: Secondary | ICD-10-CM | POA: Diagnosis not present

## 2019-07-04 NOTE — Progress Notes (Addendum)
[place] at [facility] [address] [place], [place] [address] [phone]  Date:  07/06/2019   Name:  Emily Wood   DOB:  10/10/1950   MRN:  [id number]  PCP:  Darreld Mclean, MD    Chief Complaint: Back Pain (Low back x 1 day)   History of Present Illness:  Emily Wood is a 68 y.o. very pleasant female patient who presents with the following:  Patient with history of hypertension, diabetes, chronic kidney disease, peripheral edema Last seen by myself for virtual visit in [place] that time we increased her amlodipine to 7.5 mg daily due to high blood pressure She is an endocrinology patient for her thyroid concerns, but not her diabetes Here today with concern of back pain  Colon cancer screening- she did have a colonoscopy at about age 5, has not followed up since In the past (2018) she had told me that her colonoscopy was done in about 2013.  In any case we don't have records  Will order cologuard  Flu shot- done already at [facility]  Foot exam- do today  A1c-due today  Lab Results  Component Value Date   HGBA1C 7.2 (H) 09/16/2017    Today she notes some pain in her left side, may feel like gas pains and move around to various locations in her left abdomen  She has noted this for about one month  She notes that she does use a cane to walk due to right knee issues- perhaps this is throwing off her gait  Her sister recently died from uterine cancer-she had developed postmenopausal bleeding, and was found to have metastatic cancer and died in Feb 26, 2023.  Her course was fairly rapid.  Then very sadly her brother-in-law, her sister's husband, also died last month of acute illness.  She worries about her niece and nephew.  They are in their 66s and one in [place] Pt has not noted any PMB herself She had a partial hyst but states that she still has her uterus - indeed in 2017 a pelvic MRI showed that she has a uterus    Her BP was normal at her endocrinology appt recently- 134/84 on 06/28/2019 She was seen at Dr Joycie Peek office- she is just following her thyroid, but is not checking her A1c  She is taking just 5 mg of amlodipine again; I had increased her to 7.5, but she went back to 5 mg as she did not have enough medication 7.5 mg did not seem to exacerbate her chronic lower extremity edema Clonidine HCTZ avapro lopressor   Patient Active Problem List   Diagnosis Date Noted  . Lymphedema 01/25/2017  . Fibroid uterus 01/14/2017  . Hypertensive heart disease without heart failure 10/01/2016  . Chronic kidney disease, stage 3 09/21/2016  . Meralgia paresthetica of left side 08/27/2015  . Diabetes mellitus type 2, controlled ([place]) 08/02/2015  . Chronic venous insufficiency 06/28/2015  . Essential hypertension 02/15/2015  . Peripheral edema 02/15/2015  . Chronic knee pain 02/15/2015    Past Medical History:  Diagnosis Date  . Arthritis   . Diabetes mellitus without complication ([place])   . Hypertension     Past Surgical History:  Procedure Laterality Date  . CHOLECYSTECTOMY    . HERNIA REPAIR    . OTHER SURGICAL HISTORY     scar tissue removal from bowels  . PARTIAL HYSTERECTOMY     Still has ovaries  . UTERINE  FIBROID SURGERY      Social History   Tobacco Use  . Smoking status: Never Smoker  . Smokeless tobacco: Never Used  Substance Use Topics  . Alcohol use: No    Alcohol/week: 0.0 standard drinks  . Drug use: No    Family History  Problem Relation Age of Onset  . Hyperlipidemia Mother   . Hyperlipidemia Maternal Aunt   . Breast cancer Cousin     No Known Allergies  Medication list has been reviewed and updated.  Current Outpatient Medications on File Prior to Visit  Medication Sig Dispense Refill  . Albuterol Sulfate (PROAIR RESPICLICK) 123XX123 (90 Base) MCG/ACT AEPB Inhale 2 puffs into the lungs every 6 (six) hours as needed. 1 each 3  . Alpha-Lipoic Acid 300 MG CAPS  Take 2 capsules by mouth daily.    Marland Kitchen aspirin 81 MG tablet Take 81 mg by mouth daily.    . cloNIDine (CATAPRES) 0.2 MG tablet TAKE 1 TABLET BY MOUTH 3  TIMES DAILY 270 tablet 3  . fluticasone (FLONASE) 50 MCG/ACT nasal spray SPRAY 2 SPRAYS INTO EACH NOSTRIL EVERY DAY 48 g 3  . gabapentin (NEURONTIN) 400 MG capsule TAKE 2 CAPSULES (800 MG TOTAL) BY MOUTH 3 (THREE) TIMES DAILY. 540 capsule 3  . hydrochlorothiazide (HYDRODIURIL) 25 MG tablet TAKE 1 TABLET BY MOUTH  DAILY 90 tablet 3  . HYDROcodone-homatropine (HYCODAN) 5-1.5 MG/5ML syrup Take 5 mLs by mouth every 8 (eight) hours as needed for cough. 60 mL 0  . irbesartan (AVAPRO) 300 MG tablet TAKE 1 TABLET BY MOUTH  DAILY 90 tablet 3  . metFORMIN (GLUCOPHAGE) 500 MG tablet TAKE 1 TABLET BY MOUTH  TWICE DAILY WITH MEALS 180 tablet 3  . metoprolol tartrate (LOPRESSOR) 50 MG tablet TAKE 1 TABLET BY MOUTH  TWICE DAILY 180 tablet 3  . potassium chloride SA (KLOR-CON) 20 MEQ tablet TAKE 1 TABLET BY MOUTH  DAILY 90 tablet 3  . methimazole (TAPAZOLE) 5 MG tablet Take 1 tablet by mouth daily.     No current facility-administered medications on file prior to visit.    Review of Systems:  As per HPI- otherwise negative.   Physical Examination: Vitals:   07/06/19 0846  BP: (!) 174/84  Pulse: 64  Resp: 18  Temp: (!) 96.2 F (35.7 C)  SpO2: 100%   Vitals:   07/06/19 0846  Weight: 247 lb 12.8 oz (112.4 kg)  Height: 5\' 5"  (1.651 m)   Body mass index is 41.24 kg/m. Ideal Body Weight: Weight in (lb) to have BMI = 25: 149.9  GEN: WDWN, NAD, Non-toxic, A & O x 3, obese, looks well HEENT: Atraumatic, Normocephalic. Neck supple. No masses, No LAD. Ears and Nose: No external deformity. CV: RRR, No M/G/R. No JVD. No thrill. No extra heart sounds. PULM: CTA B, no wheezes, crackles, rhonchi. No retractions. No resp. distress. No accessory muscle use. ABD: S, NT, ND, +BS. No rebound. No HSM.  Benign abdomen Her back is also negative to  palpation EXTR: No c/c/she has significant chronic lower extremity edema both ankles NEURO Normal gait.  PSYCH: Normally interactive. Conversant. Not depressed or anxious appearing.  Calm demeanor.  Foot exam; normal  Assessment and Plan: Essential hypertension - Plan: CBC, Comprehensive metabolic panel  Controlled type 2 diabetes mellitus without complication, without long-term current use of insulin ([facility]) - Plan: Hemoglobin A1c  Peripheral edema - Plan: CBC  Screening for hyperlipidemia - Plan: Lipid panel  FH: uterine cancer - Plan: Ambulatory  referral to Obstetrics / Gynecology  Here today with concern of abdominal and back pain.  However, this seems to be more due to patient being worried that she also might have uterine cancer.  She has not had any postmenopausal bleeding, which is certainly reassuring.  I will refer her to gynecology for evaluation.  She would like to establish with a new doctor, as she prefers to see a female  Her blood pressure is high today, although was close to normal at recent endocrinology appointment We will increase her amlodipine to 7.5 mg daily.  Continue other medications Labs pending as above She will let me know of any change in her abdominal and back pain Ordered Cologuard to screen for colon cancer  This visit occurred during the SARS-CoV-2 public health emergency.  Safety protocols were in place, including screening questions prior to the visit, additional usage of staff PPE, and extensive cleaning of exam room while observing appropriate contact time as indicated for disinfecting solutions.    Signed [name], MD  Received her labs, message to pt Anemia is new/worsening Diabetes control is reasonable, would consider increasing Metformin or adding a second medication to bring her A1c down if she agrees Needs cholesterol medication  Results for orders placed or performed in visit on 07/06/19  CBC  Result Value Ref Range   WBC 6.9  4.0 - 10.5 K/uL   RBC 3.83 (L) 3.87 - 5.11 Mil/uL   Platelets 380.0 150.0 - 400.0 K/uL   Hemoglobin 10.9 (L) 12.0 - 15.0 g/dL   HCT 33.1 (L) 36.0 - 46.0 %   MCV 86.4 78.0 - 100.0 fl   MCHC 32.9 30.0 - 36.0 g/dL   RDW 13.5 11.5 - 15.5 %  Comprehensive metabolic panel  Result Value Ref Range   Sodium 136 135 - 145 mEq/L   Potassium 3.6 3.5 - 5.1 mEq/L   Chloride 100 96 - 112 mEq/L   CO2 28 19 - 32 mEq/L   Glucose, Bld 142 (H) 70 - 99 mg/dL   BUN 18 6 - 23 mg/dL   Creatinine, Ser 1.12 0.40 - 1.20 mg/dL   Total Bilirubin 0.7 0.2 - 1.2 mg/dL   Alkaline Phosphatase 96 39 - 117 U/L   AST 19 0 - 37 U/L   ALT 14 0 - 35 U/L   Total Protein 8.3 6.0 - 8.3 g/dL   Albumin 4.0 3.5 - 5.2 g/dL   GFR 58.63 (L) >60.00 mL/min   Calcium 9.8 8.4 - 10.5 mg/dL  Hemoglobin A1c  Result Value Ref Range   Hgb A1c MFr Bld 7.4 (H) 4.6 - 6.5 %  Lipid panel  Result Value Ref Range   Cholesterol 182 0 - 200 mg/dL   Triglycerides 129.0 0.0 - 149.0 mg/dL   HDL 47.30 >39.00 mg/dL   VLDL 25.8 0.0 - 40.0 mg/dL   LDL Cholesterol 109 (H) 0 - 99 mg/dL   Total CHOL/HDL Ratio 4    NonHDL 135.07

## 2019-07-06 ENCOUNTER — Encounter: Payer: Self-pay | Admitting: Family Medicine

## 2019-07-06 ENCOUNTER — Ambulatory Visit (INDEPENDENT_AMBULATORY_CARE_PROVIDER_SITE_OTHER): Payer: Medicare Other | Admitting: Family Medicine

## 2019-07-06 ENCOUNTER — Other Ambulatory Visit: Payer: Self-pay

## 2019-07-06 VITALS — BP 174/84 | HR 64 | Temp 96.2°F | Resp 18 | Ht 65.0 in | Wt 247.8 lb

## 2019-07-06 DIAGNOSIS — E119 Type 2 diabetes mellitus without complications: Secondary | ICD-10-CM

## 2019-07-06 DIAGNOSIS — Z13220 Encounter for screening for lipoid disorders: Secondary | ICD-10-CM | POA: Diagnosis not present

## 2019-07-06 DIAGNOSIS — Z8049 Family history of malignant neoplasm of other genital organs: Secondary | ICD-10-CM

## 2019-07-06 DIAGNOSIS — Z1211 Encounter for screening for malignant neoplasm of colon: Secondary | ICD-10-CM

## 2019-07-06 DIAGNOSIS — I10 Essential (primary) hypertension: Secondary | ICD-10-CM

## 2019-07-06 DIAGNOSIS — R609 Edema, unspecified: Secondary | ICD-10-CM

## 2019-07-06 LAB — LIPID PANEL
Cholesterol: 182 mg/dL (ref 0–200)
HDL: 47.3 mg/dL (ref >39.00)
LDL Cholesterol: 109 mg/dL — ABNORMAL HIGH (ref 0–99)
NonHDL: 135.07
Total CHOL/HDL Ratio: 4
Triglycerides: 129 mg/dL (ref 0.0–149.0)
VLDL: 25.8 mg/dL (ref 0.0–40.0)

## 2019-07-06 LAB — COMPREHENSIVE METABOLIC PANEL
ALT: 14 U/L (ref 0–35)
AST: 19 U/L (ref 0–37)
Albumin: 4 g/dL (ref 3.5–5.2)
Alkaline Phosphatase: 96 U/L (ref 39–117)
BUN: 18 mg/dL (ref 6–23)
CO2: 28 mEq/L (ref 19–32)
Calcium: 9.8 mg/dL (ref 8.4–10.5)
Chloride: 100 mEq/L (ref 96–112)
Creatinine, Ser: 1.12 mg/dL (ref 0.40–1.20)
GFR: 58.63 mL/min — ABNORMAL LOW (ref >60.00)
Glucose, Bld: 142 mg/dL — ABNORMAL HIGH (ref 70–99)
Potassium: 3.6 mEq/L (ref 3.5–5.1)
Sodium: 136 mEq/L (ref 135–145)
Total Bilirubin: 0.7 mg/dL (ref 0.2–1.2)
Total Protein: 8.3 g/dL (ref 6.0–8.3)

## 2019-07-06 LAB — HEMOGLOBIN A1C: Hgb A1c MFr Bld: 7.4 % — ABNORMAL HIGH (ref 4.6–6.5)

## 2019-07-06 LAB — CBC
HCT: 33.1 % — ABNORMAL LOW (ref 36.0–46.0)
Hemoglobin: 10.9 g/dL — ABNORMAL LOW (ref 12.0–15.0)
MCHC: 32.9 g/dL (ref 30.0–36.0)
MCV: 86.4 fl (ref 78.0–100.0)
Platelets: 380 10*3/uL (ref 150.0–400.0)
RBC: 3.83 Mil/uL — ABNORMAL LOW (ref 3.87–5.11)
RDW: 13.5 % (ref 11.5–15.5)
WBC: 6.9 10*3/uL (ref 4.0–10.5)

## 2019-07-06 MED ORDER — AMLODIPINE BESYLATE 5 MG PO TABS: 7.5000 mg | ORAL_TABLET | Freq: Every day | ORAL | 3 refills | Status: DC

## 2019-07-06 NOTE — Patient Instructions (Addendum)
Good to see you again today- I will order the cologuard kit for you to screen for colon cancer at home I will be in touch with your labs asap  Please follow-up with your GYN about your concerns regarding endometrial cancer.  I will set you up to see a female provider per your preferance    Please increase your amlodipine to 7.5 mg daily to bring your BP down a bit

## 2019-07-13 MED ORDER — METFORMIN HCL 1000 MG PO TABS: 1000.0000 mg | ORAL_TABLET | Freq: Two times a day (BID) | ORAL | 3 refills | Status: DC

## 2019-07-19 ENCOUNTER — Encounter: Payer: Self-pay | Admitting: Family Medicine

## 2019-07-22 LAB — COLOGUARD: Cologuard: NEGATIVE

## 2019-07-27 DIAGNOSIS — M79652 Pain in left thigh: Secondary | ICD-10-CM | POA: Diagnosis not present

## 2019-07-27 DIAGNOSIS — M545 Low back pain: Secondary | ICD-10-CM | POA: Diagnosis not present

## 2019-07-27 DIAGNOSIS — M25561 Pain in right knee: Secondary | ICD-10-CM | POA: Diagnosis not present

## 2019-07-28 ENCOUNTER — Encounter: Payer: Self-pay | Admitting: Obstetrics and Gynecology

## 2019-07-28 ENCOUNTER — Other Ambulatory Visit: Payer: Self-pay

## 2019-07-28 ENCOUNTER — Ambulatory Visit (INDEPENDENT_AMBULATORY_CARE_PROVIDER_SITE_OTHER): Payer: Medicare Other | Admitting: Obstetrics and Gynecology

## 2019-07-28 VITALS — BP 150/80 | Ht 65.0 in | Wt 249.0 lb

## 2019-07-28 DIAGNOSIS — L02429 Furuncle of limb, unspecified: Secondary | ICD-10-CM | POA: Diagnosis not present

## 2019-07-28 DIAGNOSIS — Z8041 Family history of malignant neoplasm of ovary: Secondary | ICD-10-CM | POA: Diagnosis not present

## 2019-07-28 DIAGNOSIS — D239 Other benign neoplasm of skin, unspecified: Secondary | ICD-10-CM | POA: Diagnosis not present

## 2019-07-28 MED ORDER — SULFAMETHOXAZOLE-TRIMETHOPRIM 800-160 MG PO TABS: 1.0000 | ORAL_TABLET | Freq: Two times a day (BID) | ORAL | 0 refills | Status: AC

## 2019-07-28 NOTE — Progress Notes (Signed)
Obstetrics & Gynecology Office Visit   Chief Complaint:  Chief Complaint  Patient presents with  . Gynecologic Exam    bumps in vaginal area    History of Present Illness: 69 y.o. african [place] female presenting with swelling and lesion of the right thigh for the past month.  The area of concern has not drained.  She had a bartholin's cyst several years ago and has concern that this may be the same thing.  No recent or preceding trauma to the area.  Denies history of recurrent boil in thigh or arm pits.  No systemic symptoms such as fevers or chills.  In addition the patient asks about uterine cancer screening as her sister recently passed away from ovarian cancer.  Her family history is otherwise notable for a maternal cousin who was diagnosed with breast cancer.  She has not noted any postmenopausal vaginal bleeding.     Review of Systems: Review of Systems  Constitutional: Negative.   Genitourinary: Negative.   Skin: Positive for rash. Negative for itching.    Past Medical History:  Past Medical History:  Diagnosis Date  . Arthritis   . Diabetes mellitus without complication ([place])   . Hypertension     Past Surgical History:  Past Surgical History:  Procedure Laterality Date  . CHOLECYSTECTOMY    . HERNIA REPAIR    . OTHER SURGICAL HISTORY     scar tissue removal from bowels  . PARTIAL HYSTERECTOMY     Still has ovaries  . UTERINE FIBROID SURGERY      Gynecologic History: No LMP recorded. Patient is postmenopausal.  Obstetric History: G1P0010  Family History:  Family History  Problem Relation Age of Onset  . Hyperlipidemia Mother   . Hyperlipidemia Maternal Aunt   . Breast cancer Cousin     Social History:  Social History   Socioeconomic History  . Marital status: Married    Spouse name: [name]  . Number of children: 0  . Years of education: 62  . Highest education level: Not on file  Occupational History  . Occupation: customer service    Tobacco Use  . Smoking status: Never Smoker  . Smokeless tobacco: Never Used  Substance and Sexual Activity  . Alcohol use: No    Alcohol/week: 0.0 standard drinks  . Drug use: No  . Sexual activity: Not Currently    Birth control/protection: Post-menopausal  Other Topics Concern  . Not on file  Social History Narrative   Lives with husband   Caffeine use: 16oz coffee per day   Social Determinants of Health   Financial Resource Strain:   . Difficulty of Paying Living Expenses: Not on file  Food Insecurity:   . Worried About Charity fundraiser in the Last Year: Not on file  . Ran Out of Food in the Last Year: Not on file  Transportation Needs:   . Lack of Transportation (Medical): Not on file  . Lack of Transportation (Non-Medical): Not on file  Physical Activity:   . Days of Exercise per Week: Not on file  . Minutes of Exercise per Session: Not on file  Stress:   . Feeling of Stress : Not on file  Social Connections:   . Frequency of Communication with Friends and Family: Not on file  . Frequency of Social Gatherings with Friends and Family: Not on file  . Attends Religious Services: Not on file  . Active Member of Clubs or Organizations: Not on file  .  Attends Archivist Meetings: Not on file  . Marital Status: Not on file  Intimate Partner Violence:   . Fear of Current or Ex-Partner: Not on file  . Emotionally Abused: Not on file  . Physically Abused: Not on file  . Sexually Abused: Not on file    Allergies:  No Known Allergies  Medications: Prior to Admission medications   Medication Sig Start Date End Date Taking? Authorizing Provider  Albuterol Sulfate (PROAIR RESPICLICK) 962 (90 Base) MCG/ACT AEPB Inhale 2 puffs into the lungs every 6 (six) hours as needed. 10/01/16  Yes Copland, Gay Filler, MD  Alpha-Lipoic Acid 300 MG CAPS Take 2 capsules by mouth daily.   Yes [provider]  amLODipine (NORVASC) 5 MG tablet Take 1.5 tablets (7.5 mg  total) by mouth daily. 07/06/19  Yes Copland, Gay Filler, MD  aspirin 81 MG tablet Take 81 mg by mouth daily.   Yes [provider]  cloNIDine (CATAPRES) 0.2 MG tablet TAKE 1 TABLET BY MOUTH 3  TIMES DAILY 04/27/19  Yes Copland, Gay Filler, MD  fluticasone (FLONASE) 50 MCG/ACT nasal spray SPRAY 2 SPRAYS INTO EACH NOSTRIL EVERY DAY 01/31/18  Yes Copland, Gay Filler, MD  gabapentin (NEURONTIN) 400 MG capsule TAKE 2 CAPSULES (800 MG TOTAL) BY MOUTH 3 (THREE) TIMES DAILY. 12/05/18  Yes Copland, Gay Filler, MD  hydrochlorothiazide (HYDRODIURIL) 25 MG tablet TAKE 1 TABLET BY MOUTH  DAILY 04/27/19  Yes Copland, Gay Filler, MD  HYDROcodone-homatropine (HYCODAN) 5-1.5 MG/5ML syrup Take 5 mLs by mouth every 8 (eight) hours as needed for cough. 08/22/18  Yes Copland, Gay Filler, MD  irbesartan (AVAPRO) 300 MG tablet TAKE 1 TABLET BY MOUTH  DAILY 04/27/19  Yes Copland, Gay Filler, MD  metFORMIN (GLUCOPHAGE) 1000 MG tablet Take 1 tablet (1,000 mg total) by mouth 2 (two) times daily with a meal. 07/13/19  Yes Copland, Gay Filler, MD  metoprolol tartrate (LOPRESSOR) 50 MG tablet TAKE 1 TABLET BY MOUTH  TWICE DAILY 04/27/19  Yes Copland, Gay Filler, MD  potassium chloride SA (KLOR-CON) 20 MEQ tablet TAKE 1 TABLET BY MOUTH  DAILY 04/27/19  Yes Copland, Gay Filler, MD  methimazole (TAPAZOLE) 5 MG tablet Take 1 tablet by mouth daily. 05/28/17 05/28/18  [provider]  sulfamethoxazole-trimethoprim (BACTRIM DS) 800-160 MG tablet Take 1 tablet by mouth 2 (two) times daily for 10 days. 07/28/19 08/07/19  Malachy Mood, MD    Physical Exam Vitals:  Vitals:   07/28/19 0811  BP: (!) 150/80   No LMP recorded. Patient is postmenopausal.  General: NAD, well nourished, appears stated age 36: normocephalic, anicteric Pulmonary: No increased work of breathing Genitourinary:  External: Normal external female genitalia.  Normal urethral meatus, normal Bartholin's and Skene's glands.  There are two small dermatofibromas  in the right labia majora although early folliculitis is also in the differential.  The area of concern is a small area of induration 1.5cm in on the medial right thigh.  There is no evidence of underlying fluid collection on exam.    Rectal: deferred  Lymphatic: no evidence of inguinal lymphadenopathy Extremities: no edema, erythema, or tenderness Neurologic: Grossly intact Psychiatric: mood appropriate, affect full  Female chaperone present for pelvic  portions of the physical exam  Assessment: 69 y.o. G1P0010 boil right thigh  Plan: Problem List Items Addressed This Visit    None    Visit Diagnoses    Boil, thigh    -  Primary   Relevant Medications   sulfamethoxazole-trimethoprim (  BACTRIM DS) 800-160 MG tablet   Dermatofibroma       Family history of ovarian cancer          1) Boil - Rx bactrim for 10 days, also two small dermatofibromas vs early caruncles left labia majora  2) Sister passed away from ovarian cancer, patient has not had any bleeding.  Maternal cousin with breast cancer.  Patient has had ovaries removed.  Given info BRCA testing.  .  If a mutation is found, the patient would be advised of an increased risk of breast cancer and ovarian cancer.  If a mutation is identified other family members could be tested and would have the opportunity to take advantage of approaches to prevention and early detection of breast and ovarian cancer.    3) A total of 15 minutes were spent in face-to-face contact with the patient during this encounter with over half of that time devoted to counseling and coordination of care.  4) Return in about 10 days (around 08/07/2019) for annual/medication follow up.     Malachy Mood, MD, [facility], [facility] 07/28/2019, 8:43 AM

## 2019-08-03 ENCOUNTER — Encounter (INDEPENDENT_AMBULATORY_CARE_PROVIDER_SITE_OTHER): Payer: Self-pay | Admitting: Vascular Surgery

## 2019-08-03 ENCOUNTER — Ambulatory Visit (INDEPENDENT_AMBULATORY_CARE_PROVIDER_SITE_OTHER): Payer: Medicare Other | Admitting: Vascular Surgery

## 2019-08-03 ENCOUNTER — Other Ambulatory Visit: Payer: Self-pay

## 2019-08-03 VITALS — BP 162/80 | HR 64 | Resp 16 | Wt 255.0 lb

## 2019-08-03 DIAGNOSIS — I119 Hypertensive heart disease without heart failure: Secondary | ICD-10-CM | POA: Diagnosis not present

## 2019-08-03 DIAGNOSIS — I10 Essential (primary) hypertension: Secondary | ICD-10-CM

## 2019-08-03 DIAGNOSIS — I890 Lymphedema, not elsewhere classified: Secondary | ICD-10-CM

## 2019-08-03 DIAGNOSIS — E119 Type 2 diabetes mellitus without complications: Secondary | ICD-10-CM | POA: Diagnosis not present

## 2019-08-03 DIAGNOSIS — I872 Venous insufficiency (chronic) (peripheral): Secondary | ICD-10-CM

## 2019-08-03 NOTE — Progress Notes (Signed)
MRN : [id number]  Emily Wood is a 69 y.o. (12-08-1950) female who presents with chief complaint of  Chief Complaint  Patient presents with  . Follow-up    73yr follow up  .  History of Present Illness:   The patient returns to the office for followup evaluation regarding leg swelling. The swelling has persisted but with the lymph pump is much, much better controlled. The pain associated with swelling is essentially eliminated. There have not been any interval development of a ulcerations or wounds.  The patient denies problems with the pump, noting it is working well and the leggings are in good condition but she admits she has not been using it routinely.  Since the previous visit the patient has been wearing graduated compression stockings and using the lymph pump on a routine basis and has noted significant improvement in the lymphedema.   Patient stated the lymph pump has been a very positive factor in her care.  Current Meds  Medication Sig  . Albuterol Sulfate (PROAIR RESPICLICK) 123XX123 (90 Base) MCG/ACT AEPB Inhale 2 puffs into the lungs every 6 (six) hours as needed.  . Alpha-Lipoic Acid 300 MG CAPS Take 2 capsules by mouth daily.  Marland Kitchen amLODipine (NORVASC) 5 MG tablet Take 1.5 tablets (7.5 mg total) by mouth daily.  Marland Kitchen aspirin 81 MG tablet Take 81 mg by mouth daily.  . cloNIDine (CATAPRES) 0.2 MG tablet TAKE 1 TABLET BY MOUTH 3  TIMES DAILY  . fluticasone (FLONASE) 50 MCG/ACT nasal spray SPRAY 2 SPRAYS INTO EACH NOSTRIL EVERY DAY  . gabapentin (NEURONTIN) 400 MG capsule TAKE 2 CAPSULES (800 MG TOTAL) BY MOUTH 3 (THREE) TIMES DAILY.  . hydrochlorothiazide (HYDRODIURIL) 25 MG tablet TAKE 1 TABLET BY MOUTH  DAILY  . HYDROcodone-homatropine (HYCODAN) 5-1.5 MG/5ML syrup Take 5 mLs by mouth every 8 (eight) hours as needed for cough.  . irbesartan (AVAPRO) 300 MG tablet TAKE 1 TABLET BY MOUTH  DAILY  . metFORMIN (GLUCOPHAGE) 1000 MG tablet Take 1 tablet (1,000 mg total) by  mouth 2 (two) times daily with a meal.  . metoprolol tartrate (LOPRESSOR) 50 MG tablet TAKE 1 TABLET BY MOUTH  TWICE DAILY  . potassium chloride SA (KLOR-CON) 20 MEQ tablet TAKE 1 TABLET BY MOUTH  DAILY  . sulfamethoxazole-trimethoprim (BACTRIM DS) 800-160 MG tablet Take 1 tablet by mouth 2 (two) times daily for 10 days.    Past Medical History:  Diagnosis Date  . Arthritis   . Diabetes mellitus without complication ([place])   . Hypertension     Past Surgical History:  Procedure Laterality Date  . CHOLECYSTECTOMY    . HERNIA REPAIR    . OTHER SURGICAL HISTORY     scar tissue removal from bowels  . PARTIAL HYSTERECTOMY     Still has ovaries  . UTERINE FIBROID SURGERY      Social History Social History   Tobacco Use  . Smoking status: Never Smoker  . Smokeless tobacco: Never Used  Substance Use Topics  . Alcohol use: No    Alcohol/week: 0.0 standard drinks  . Drug use: No    Family History Family History  Problem Relation Age of Onset  . Hyperlipidemia Mother   . Hyperlipidemia Maternal Aunt   . Breast cancer Cousin     No Known Allergies   REVIEW OF SYSTEMS (Negative unless checked)  Constitutional: [] Weight loss  [] Fever  [] Chills Cardiac: [] Chest pain   [] Chest pressure   [] Palpitations   [] Shortness of  breath when laying flat   [] Shortness of breath with exertion. Vascular:  [] Pain in legs with walking   [x] Pain in legs at rest  [] History of DVT   [] Phlebitis   [x] Swelling in legs   [] Varicose veins   [] Non-healing ulcers Pulmonary:   [] Uses home oxygen   [] Productive cough   [] Hemoptysis   [] Wheeze  [] COPD   [] Asthma Neurologic:  [] Dizziness   [] Seizures   [] History of stroke   [] History of TIA  [] Aphasia   [] Vissual changes   [] Weakness or numbness in arm   [] Weakness or numbness in leg Musculoskeletal:   [] Joint swelling   [] Joint pain   [] Low back pain Hematologic:  [] Easy bruising  [] Easy bleeding   [] Hypercoagulable state   [] Anemic Gastrointestinal:   [] Diarrhea   [] Vomiting  [] Gastroesophageal reflux/heartburn   [] Difficulty swallowing. Genitourinary:  [] Chronic kidney disease   [] Difficult urination  [] Frequent urination   [] Blood in urine Skin:  [] Rashes   [] Ulcers  Psychological:  [] History of anxiety   []  History of major depression.  Physical Examination  Vitals:   08/03/19 0949  BP: (!) 162/80  Pulse: 64  Resp: 16  Weight: 255 lb (115.7 kg)   Body mass index is 42.43 kg/m. Gen: WD/WN, NAD Head: [place]/AT, No temporalis wasting.  Ear/Nose/Throat: Hearing grossly intact, nares w/o erythema or drainage Eyes: PER, EOMI, sclera nonicteric.  Neck: Supple, no large masses.   Pulmonary:  Good air movement, no audible wheezing bilaterally, no use of accessory muscles.  Cardiac: RRR, no JVD Vascular: scattered varicosities present bilaterally.  Severe venous stasis changes to the legs bilaterally.  3-4+ hard pitting edema Gastrointestinal: Non-distended. No guarding/no peritoneal signs.  Musculoskeletal: M/S 5/5 throughout.  No deformity or atrophy.  Neurologic: CN 2-12 intact. Symmetrical.  Speech is fluent. Motor exam as listed above. Psychiatric: Judgment intact, Mood & affect appropriate for pt's clinical situation. Dermatologic: No rashes or ulcers noted.  No changes consistent with cellulitis. Lymph : No lichenification or skin changes of chronic lymphedema.  CBC Lab Results  Component Value Date   WBC 6.9 07/06/2019   HGB 10.9 (L) 07/06/2019   HCT 33.1 (L) 07/06/2019   MCV 86.4 07/06/2019   PLT 380.0 07/06/2019    BMET    Component Value Date/Time   NA 136 07/06/2019 0942   K 3.6 07/06/2019 0942   CL 100 07/06/2019 0942   CO2 28 07/06/2019 0942   GLUCOSE 142 (H) 07/06/2019 0942   BUN 18 07/06/2019 0942   BUN 30 (A) 10/15/2017 0000   CREATININE 1.12 07/06/2019 0942   CREATININE 1.01 (H) 08/26/2015 1203   CALCIUM 9.8 07/06/2019 0942   CrCl cannot be calculated (Patient's most recent lab result is older than the  maximum 21 days allowed.).  COAG No results found for: INR, PROTIME  Radiology No results found.    Assessment/Plan 1. Lymphedema  No surgery or intervention at this point in time.    I have reviewed my discussion with the patient regarding lymphedema and why it  causes symptoms.  Patient will continue wearing graduated compression stockings class 1 (20-30 mmHg) on a daily basis a prescription was given. The patient is reminded to put the stockings on first thing in the morning and removing them in the evening. The patient is instructed specifically not to sleep in the stockings.   In addition, behavioral modification throughout the day will be continued.  This will include frequent elevation (such as in a recliner), use of over the  counter pain medications as needed and exercise such as walking.  I have reviewed systemic causes for chronic edema such as liver, kidney and cardiac etiologies and there does not appear to be any significant changes in these organ systems over the past year.  The patient is under the impression that these organ systems are all stable and unchanged.    The patient will continue aggressive use of the  lymph pump.  This will continue to improve the edema control and prevent sequela such as ulcers and infections.   The patient will follow-up with me on an annual basis.    2. Chronic venous insufficiency  No surgery or intervention at this point in time.    I have reviewed my discussion with the patient regarding lymphedema and why it  causes symptoms.  Patient will continue wearing graduated compression stockings class 1 (20-30 mmHg) on a daily basis a prescription was given. The patient is reminded to put the stockings on first thing in the morning and removing them in the evening. The patient is instructed specifically not to sleep in the stockings.   In addition, behavioral modification throughout the day will be continued.  This will include frequent  elevation (such as in a recliner), use of over the counter pain medications as needed and exercise such as walking.  I have reviewed systemic causes for chronic edema such as liver, kidney and cardiac etiologies and there does not appear to be any significant changes in these organ systems over the past year.  The patient is under the impression that these organ systems are all stable and unchanged.    The patient will continue aggressive use of the  lymph pump.  This will continue to improve the edema control and prevent sequela such as ulcers and infections.   The patient will follow-up with me on an annual basis.    3. Essential hypertension Continue antihypertensive medications as already ordered, these medications have been reviewed and there are no changes at this time.   4. Hypertensive heart disease without heart failure Continue cardiac and antihypertensive medications as already ordered and reviewed, no changes at this time.  Continue statin as ordered and reviewed, no changes at this time  Nitrates PRN for chest pain   5. Controlled type 2 diabetes mellitus without complication, without long-term current use of insulin (HCC) Continue hypoglycemic medications as already ordered, these medications have been reviewed and there are no changes at this time.  Hgb A1C to be monitored as already arranged by primary service     Hortencia Pilar, MD  08/03/2019 10:25 AM

## 2019-08-10 DIAGNOSIS — Z1212 Encounter for screening for malignant neoplasm of rectum: Secondary | ICD-10-CM | POA: Diagnosis not present

## 2019-08-10 DIAGNOSIS — Z1211 Encounter for screening for malignant neoplasm of colon: Secondary | ICD-10-CM | POA: Diagnosis not present

## 2019-08-15 ENCOUNTER — Ambulatory Visit (INDEPENDENT_AMBULATORY_CARE_PROVIDER_SITE_OTHER): Payer: Medicare Other | Admitting: Obstetrics and Gynecology

## 2019-08-15 ENCOUNTER — Encounter: Payer: Self-pay | Admitting: Obstetrics and Gynecology

## 2019-08-15 ENCOUNTER — Other Ambulatory Visit: Payer: Self-pay

## 2019-08-15 VITALS — BP 144/90 | HR 64 | Ht 65.0 in | Wt 251.0 lb

## 2019-08-15 DIAGNOSIS — Z1239 Encounter for other screening for malignant neoplasm of breast: Secondary | ICD-10-CM

## 2019-08-15 DIAGNOSIS — B379 Candidiasis, unspecified: Secondary | ICD-10-CM

## 2019-08-15 DIAGNOSIS — Z01419 Encounter for gynecological examination (general) (routine) without abnormal findings: Secondary | ICD-10-CM

## 2019-08-15 LAB — COLOGUARD
COLOGUARD: NEGATIVE
COLOGUARD: NEGATIVE

## 2019-08-15 MED ORDER — NYSTATIN 100000 UNIT/GM EX CREA: 1.0000 "application " | TOPICAL_CREAM | Freq: Two times a day (BID) | CUTANEOUS | 1 refills | Status: DC

## 2019-08-15 NOTE — Progress Notes (Signed)
Gynecology Annual Exam  PCP: Copland, Gay Filler, MD  Chief Complaint:  Chief Complaint  Patient presents with  . Gynecologic Exam  . Follow-up    Medication    History of Present Illness:Patient is a 69 y.o. G1P0010 presents for annual exam. The patient has no complaints today.   LMP: No LMP recorded. Patient is postmenopausal. No PMB  The patient does perform self breast exams.  There is no notable family history of breast or ovarian cancer in her family.  The patient wears seatbelts: yes.   The patient has regular exercise: not asked.    The patient denies current symptoms of depression.     Review of Systems: Review of Systems  Constitutional: Negative for chills and fever.  HENT: Negative for congestion.   Respiratory: Negative for cough and shortness of breath.   Cardiovascular: Negative for chest pain and palpitations.  Gastrointestinal: Negative for abdominal pain, constipation, diarrhea, heartburn, nausea and vomiting.  Genitourinary: Negative for dysuria, frequency and urgency.  Skin: Negative for itching and rash.  Neurological: Negative for dizziness and headaches.  Endo/Heme/Allergies: Negative for polydipsia.  Psychiatric/Behavioral: Negative for depression.    Past Medical History:  Past Medical History:  Diagnosis Date  . Arthritis   . Diabetes mellitus without complication ([place])   . Hypertension     Past Surgical History:  Past Surgical History:  Procedure Laterality Date  . CHOLECYSTECTOMY    . HERNIA REPAIR    . OTHER SURGICAL HISTORY     scar tissue removal from bowels  . PARTIAL HYSTERECTOMY     Still has ovaries  . UTERINE FIBROID SURGERY      Gynecologic History:  No LMP recorded. Patient is postmenopausal. Last Pap: Results were: 01/14/2017 NIL and HR HPV negative  Last mammogram: 06/14/2019 Results were: BI-RAD I  Obstetric History: G1P0010  Family History:  Family History  Problem Relation Age of Onset  . Hyperlipidemia  Mother   . Hyperlipidemia Maternal Aunt   . Breast cancer Cousin     Social History:  Social History   Socioeconomic History  . Marital status: Married    Spouse name: [name]  . Number of children: 0  . Years of education: 15  . Highest education level: Not on file  Occupational History  . Occupation: customer service  Tobacco Use  . Smoking status: Never Smoker  . Smokeless tobacco: Never Used  Substance and Sexual Activity  . Alcohol use: No    Alcohol/week: 0.0 standard drinks  . Drug use: No  . Sexual activity: Not Currently    Birth control/protection: Post-menopausal  Other Topics Concern  . Not on file  Social History Narrative   Lives with husband   Caffeine use: 16oz coffee per day   Social Determinants of Health   Financial Resource Strain:   . Difficulty of Paying Living Expenses: Not on file  Food Insecurity:   . Worried About Charity fundraiser in the Last Year: Not on file  . Ran Out of Food in the Last Year: Not on file  Transportation Needs:   . Lack of Transportation (Medical): Not on file  . Lack of Transportation (Non-Medical): Not on file  Physical Activity:   . Days of Exercise per Week: Not on file  . Minutes of Exercise per Session: Not on file  Stress:   . Feeling of Stress : Not on file  Social Connections:   . Frequency of Communication with Friends and Family:  Not on file  . Frequency of Social Gatherings with Friends and Family: Not on file  . Attends Religious Services: Not on file  . Active Member of Clubs or Organizations: Not on file  . Attends Archivist Meetings: Not on file  . Marital Status: Not on file  Intimate Partner Violence:   . Fear of Current or Ex-Partner: Not on file  . Emotionally Abused: Not on file  . Physically Abused: Not on file  . Sexually Abused: Not on file    Allergies:  No Known Allergies  Medications: Prior to Admission medications   Medication Sig Start Date End Date Taking?  Authorizing Provider  Albuterol Sulfate (PROAIR RESPICLICK) 123XX123 (90 Base) MCG/ACT AEPB Inhale 2 puffs into the lungs every 6 (six) hours as needed. 10/01/16  Yes Copland, Gay Filler, MD  Alpha-Lipoic Acid 300 MG CAPS Take 2 capsules by mouth daily.   Yes [provider]  amLODipine (NORVASC) 5 MG tablet Take 1.5 tablets (7.5 mg total) by mouth daily. 07/06/19  Yes Copland, Gay Filler, MD  aspirin 81 MG tablet Take 81 mg by mouth daily.   Yes [provider]  cloNIDine (CATAPRES) 0.2 MG tablet TAKE 1 TABLET BY MOUTH 3  TIMES DAILY 04/27/19  Yes Copland, Gay Filler, MD  fluticasone (FLONASE) 50 MCG/ACT nasal spray SPRAY 2 SPRAYS INTO EACH NOSTRIL EVERY DAY 01/31/18  Yes Copland, Gay Filler, MD  gabapentin (NEURONTIN) 400 MG capsule TAKE 2 CAPSULES (800 MG TOTAL) BY MOUTH 3 (THREE) TIMES DAILY. 12/05/18  Yes Copland, Gay Filler, MD  hydrochlorothiazide (HYDRODIURIL) 25 MG tablet TAKE 1 TABLET BY MOUTH  DAILY 04/27/19  Yes Copland, Gay Filler, MD  HYDROcodone-homatropine (HYCODAN) 5-1.5 MG/5ML syrup Take 5 mLs by mouth every 8 (eight) hours as needed for cough. 08/22/18  Yes Copland, Gay Filler, MD  irbesartan (AVAPRO) 300 MG tablet TAKE 1 TABLET BY MOUTH  DAILY 04/27/19  Yes Copland, Gay Filler, MD  metFORMIN (GLUCOPHAGE) 1000 MG tablet Take 1 tablet (1,000 mg total) by mouth 2 (two) times daily with a meal. 07/13/19  Yes Copland, Gay Filler, MD  metoprolol tartrate (LOPRESSOR) 50 MG tablet TAKE 1 TABLET BY MOUTH  TWICE DAILY 04/27/19  Yes Copland, Gay Filler, MD  potassium chloride SA (KLOR-CON) 20 MEQ tablet TAKE 1 TABLET BY MOUTH  DAILY 04/27/19  Yes Copland, Gay Filler, MD  predniSONE (DELTASONE) 10 MG tablet 1 TAB BY MOUTH 3 TIMES A DAY FOR 2 DAYS, 1 TAB TWICE A DAY FOR 5 DAYS, 1 TAB DAILY UNTIL FINISHED 08/01/19  Yes [provider]  methimazole (TAPAZOLE) 5 MG tablet Take 1 tablet by mouth daily. 05/28/17 05/28/18  [provider]    Physical Exam Vitals: Blood pressure (!) 144/90,  pulse 64, height 5\' 5"  (1.651 m), weight 251 lb (113.9 kg).  General: NAD HEENT: normocephalic, anicteric Thyroid: no enlargement, no palpable nodules Pulmonary: No increased work of breathing, CTAB Cardiovascular: RRR, distal pulses 2+ Breast: Breast symmetrical, no tenderness, no palpable nodules or masses, no skin or nipple retraction present, no nipple discharge.  No axillary or supraclavicular lymphadenopathy. Abdomen: NABS, soft, non-tender, non-distended.  Umbilicus without lesions.  No hepatomegaly, splenomegaly or masses palpable. No evidence of hernia  Genitourinary:  External: Normal external female genitalia.  Normal urethral meatus, normal Bartholin's and Skene's glands.    Vagina: Normal vaginal mucosa, no evidence of prolapse.    Cervix: Grossly normal in appearance, no bleeding  Uterus: Non-enlarged, mobile, normal contour.  No CMT  Adnexa: ovaries non-enlarged, no adnexal masses  Rectal: deferred  Lymphatic: no evidence of inguinal lymphadenopathy Extremities: no edema, erythema, or tenderness Neurologic: Grossly intact Psychiatric: mood appropriate, affect full  Female chaperone present for pelvic and breast  portions of the physical exam     Assessment: 69 y.o. G1P0010 routine annual exam  Plan: Problem List Items Addressed This Visit    None      1) Mammogram - recommend yearly screening mammogram.  Mammogram Was ordered today  2) STI screening  was notoffered and therefore not obtained  3) ASCCP guidelines and rational discussed.  Patient opts for discontinue age >25 screening interval - patient's history reports partial hysterectomy but imaging 2017 MRI pelvis shows intact uterus with several fibroids.  Likely prior myomectomy.    4) Osteoporosis  - per USPTF routine screening DEXA at age 78 - completed 01/05/2018 normal 1.094g/cm^2, T score 0.7  5) Routine healthcare maintenance including cholesterol, diabetes screening discussed managed by PCP  6)  Colonoscopy - last 2013  7) Nystatin for tinea corporis under left breast  8) Return in about 1 year (around 08/14/2020) for annual.    Malachy Mood, MD [facility] Pigeon, [facility] 08/15/2019, 9:34 AM

## 2019-08-15 NOTE — Patient Instructions (Signed)
[facility] [address] [place] [address]  [facility] [place]  [address] [place] [address]  Phone: [phone]  

## 2019-08-17 ENCOUNTER — Encounter: Payer: Self-pay | Admitting: Family Medicine

## 2019-08-18 DIAGNOSIS — M25552 Pain in left hip: Secondary | ICD-10-CM | POA: Diagnosis not present

## 2019-08-18 DIAGNOSIS — M545 Low back pain: Secondary | ICD-10-CM | POA: Diagnosis not present

## 2019-08-26 DIAGNOSIS — M25552 Pain in left hip: Secondary | ICD-10-CM | POA: Diagnosis not present

## 2019-09-05 ENCOUNTER — Other Ambulatory Visit: Payer: Self-pay | Admitting: Orthopedic Surgery

## 2019-09-05 DIAGNOSIS — R1900 Intra-abdominal and pelvic swelling, mass and lump, unspecified site: Secondary | ICD-10-CM

## 2019-09-15 ENCOUNTER — Ambulatory Visit
Admission: RE | Admit: 2019-09-15 | Discharge: 2019-09-15 | Disposition: A | Discharge status: Home / Self Care | Payer: Medicare Other | Source: Ambulatory Visit | Attending: Orthopedic Surgery | Admitting: Orthopedic Surgery

## 2019-09-15 ENCOUNTER — Other Ambulatory Visit: Payer: Self-pay

## 2019-09-15 DIAGNOSIS — R1900 Intra-abdominal and pelvic swelling, mass and lump, unspecified site: Secondary | ICD-10-CM

## 2019-09-15 MED ORDER — IOPAMIDOL (ISOVUE-300) INJECTION 61%
100.0000 mL | Freq: Once | INTRAVENOUS | Status: AC | PRN
  Administered 2019-09-15: 100 mL via INTRAVENOUS

## 2019-09-22 ENCOUNTER — Other Ambulatory Visit: Payer: Self-pay | Admitting: Obstetrics and Gynecology

## 2019-09-22 ENCOUNTER — Telehealth: Payer: Self-pay

## 2019-09-22 NOTE — Telephone Encounter (Signed)
Pt calling triage today stating she went and had MRI and CT scan done for her left leg. They found fibroids in uterus and was told to call her OBGYN to see if AMS wanted to see her or what he thought of this. Pt requesting an u/s. Please advise.

## 2019-09-22 NOTE — Telephone Encounter (Signed)
Please advise 

## 2019-09-22 NOTE — Progress Notes (Signed)
CT scan findings reviewed showing a small 2.4cm right lateral fundal fibroid.  There me be a second small fibroid on below that particular fibroid.  I discussed with the patient that these occur in approximately 50% of women.  Generally we observe a decrease in size postmenopausally and given the size of the current fibroid as well as postmenopausal status no additional imaging or interventions are recommended from a GYN standpoint

## 2019-09-25 DIAGNOSIS — M13861 Other specified arthritis, right knee: Secondary | ICD-10-CM | POA: Diagnosis not present

## 2019-09-25 DIAGNOSIS — M1712 Unilateral primary osteoarthritis, left knee: Secondary | ICD-10-CM | POA: Insufficient documentation

## 2019-09-25 DIAGNOSIS — M79659 Pain in unspecified thigh: Secondary | ICD-10-CM | POA: Diagnosis not present

## 2019-09-25 DIAGNOSIS — M13862 Other specified arthritis, left knee: Secondary | ICD-10-CM | POA: Diagnosis not present

## 2019-10-05 ENCOUNTER — Other Ambulatory Visit: Payer: Self-pay | Admitting: Family Medicine

## 2019-10-05 DIAGNOSIS — M5432 Sciatica, left side: Secondary | ICD-10-CM

## 2019-10-21 DIAGNOSIS — M79602 Pain in left arm: Secondary | ICD-10-CM | POA: Diagnosis not present

## 2019-10-25 DIAGNOSIS — M13862 Other specified arthritis, left knee: Secondary | ICD-10-CM | POA: Diagnosis not present

## 2019-10-27 NOTE — Progress Notes (Signed)
Nurse connected with patient 10/30/19 at  1:00 PM EDT by a telephone enabled telemedicine application and verified that I am speaking with the correct person using two identifiers. Patient stated full name and DOB. Patient gave permission to continue with virtual visit. Patient's location was at home and Nurse's location was at [place] office.   Subjective:   Emily Wood is a 69 y.o. female who presents for an Initial Medicare Annual Wellness Visit.  Review of Systems    Cardiac Risk Factors include: advanced age (>36men, >21 women);hypertension;diabetes mellitus;obesity (BMI >30kg/m2);sedentary lifestyle Home Safety/Smoke Alarms: Feels safe in home. Smoke alarms in place.  Lives w/ husband. 1 story home.Uses cane.    Female:         Mammo-06/14/19       Dexa scan- 01/05/18       CCS- pt reports she did cologuard a couple of months ago and will find letter and send to office. Eye- pt reports last DM eye exam 06/2019. [facility].     Objective:    Advanced Directives 10/30/2019 11/30/2016 06/28/2015  Does Patient Have a Medical Advance Directive? No Yes No  Type of Advance Directive - Healthcare Power of Attorney -  Would patient like information on creating a medical advance directive? No - Patient declined - -    Current Medications (verified) Outpatient Encounter Medications as of 10/30/2019  Medication Sig  . Alpha-Lipoic Acid 300 MG CAPS Take 2 capsules by mouth daily.  Marland Kitchen amLODipine (NORVASC) 5 MG tablet Take 1.5 tablets (7.5 mg total) by mouth daily.  Marland Kitchen aspirin 81 MG tablet Take 81 mg by mouth daily.  . cloNIDine (CATAPRES) 0.2 MG tablet TAKE 1 TABLET BY MOUTH 3  TIMES DAILY  . fluticasone (FLONASE) 50 MCG/ACT nasal spray SPRAY 2 SPRAYS INTO EACH NOSTRIL EVERY DAY  . gabapentin (NEURONTIN) 400 MG capsule TAKE 2 CAPSULES BY MOUTH 3  TIMES DAILY  . hydrochlorothiazide (HYDRODIURIL) 25 MG tablet TAKE 1 TABLET BY MOUTH  DAILY  . irbesartan (AVAPRO) 300 MG tablet  TAKE 1 TABLET BY MOUTH  DAILY  . metFORMIN (GLUCOPHAGE) 1000 MG tablet Take 1 tablet (1,000 mg total) by mouth 2 (two) times daily with a meal.  . metoprolol tartrate (LOPRESSOR) 50 MG tablet TAKE 1 TABLET BY MOUTH  TWICE DAILY  . nystatin cream (MYCOSTATIN) Apply 1 application topically 2 (two) times daily.  . potassium chloride SA (KLOR-CON) 20 MEQ tablet TAKE 1 TABLET BY MOUTH  DAILY  . Albuterol Sulfate (PROAIR RESPICLICK) 123XX123 (90 Base) MCG/ACT AEPB Inhale 2 puffs into the lungs every 6 (six) hours as needed. (Patient not taking: Reported on 10/30/2019)  . HYDROcodone-homatropine (HYCODAN) 5-1.5 MG/5ML syrup Take 5 mLs by mouth every 8 (eight) hours as needed for cough. (Patient not taking: Reported on 10/30/2019)  . methimazole (TAPAZOLE) 5 MG tablet Take 1 tablet by mouth daily.  . [DISCONTINUED] predniSONE (DELTASONE) 10 MG tablet 1 TAB BY MOUTH 3 TIMES A DAY FOR 2 DAYS, 1 TAB TWICE A DAY FOR 5 DAYS, 1 TAB DAILY UNTIL FINISHED   No facility-administered encounter medications on file as of 10/30/2019.    Allergies (verified) Patient has no known allergies.   History: Past Medical History:  Diagnosis Date  . Arthritis   . Diabetes mellitus without complication ([place])   . Hypertension    Past Surgical History:  Procedure Laterality Date  . CHOLECYSTECTOMY    . HERNIA REPAIR    . OTHER SURGICAL HISTORY  scar tissue removal from bowels  . PARTIAL HYSTERECTOMY     Still has ovaries  . UTERINE FIBROID SURGERY     Family History  Problem Relation Age of Onset  . Hyperlipidemia Mother   . Hyperlipidemia Maternal Aunt   . Breast cancer Cousin    Social History   Socioeconomic History  . Marital status: Married    Spouse name: [name]  . Number of children: 0  . Years of education: 13  . Highest education level: Not on file  Occupational History  . Occupation: customer service  Tobacco Use  . Smoking status: Never Smoker  . Smokeless tobacco: Never Used  Substance and  Sexual Activity  . Alcohol use: No    Alcohol/week: 0.0 standard drinks  . Drug use: No  . Sexual activity: Not Currently    Birth control/protection: Post-menopausal  Other Topics Concern  . Not on file  Social History Narrative   Lives with husband   Caffeine use: 16oz coffee per day   Social Determinants of Health   Financial Resource Strain: Low Risk   . Difficulty of Paying Living Expenses: Not hard at all  Food Insecurity: No Food Insecurity  . Worried About Charity fundraiser in the Last Year: Never true  . Ran Out of Food in the Last Year: Never true  Transportation Needs: No Transportation Needs  . Lack of Transportation (Medical): No  . Lack of Transportation (Non-Medical): No  Physical Activity:   . Days of Exercise per Week:   . Minutes of Exercise per Session:   Stress:   . Feeling of Stress :   Social Connections:   . Frequency of Communication with Friends and Family:   . Frequency of Social Gatherings with Friends and Family:   . Attends Religious Services:   . Active Member of Clubs or Organizations:   . Attends Archivist Meetings:   Marland Kitchen Marital Status:     Tobacco Counseling Counseling given: Not Answered   Clinical Intake: Pain : No/denies pain    Activities of Daily Living In your present state of health, do you have any difficulty performing the following activities: 10/30/2019  Hearing? N  Vision? N  Difficulty concentrating or making decisions? N  Walking or climbing stairs? N  Dressing or bathing? N  Doing errands, shopping? N  Preparing Food and eating ? N  Using the Toilet? N  In the past six months, have you accidently leaked urine? Y  Do you have problems with loss of bowel control? N  Managing your Medications? N  Managing your Finances? N  Housekeeping or managing your Housekeeping? N  Some recent data might be hidden     Immunizations and Health Maintenance Immunization History  Administered Date(s) Administered    . Fluad Quad(high Dose 65+) 04/29/2019  . Influenza, High Dose Seasonal PF 06/17/2017, 05/26/2018  . Influenza,inj,Quad PF,6+ Mos 05/08/2015  . Influenza-Unspecified 04/20/2015, 07/17/2016  . Pneumococcal Conjugate-13 11/25/2017  . Pneumococcal Polysaccharide-23 05/08/2015  . Tdap 12/31/2016   There are no preventive care reminders to display for this patient.  Patient Care Team: Copland, Gay Filler, MD as PCP - General (Family Medicine)  Indicate any recent Medical Services you may have received from other than [facility] providers in the past year (date may be approximate).     Assessment:   This is a routine wellness examination for Siriyah. Physical assessment deferred to PCP.  Hearing/Vision screen Unable to assess. This visit is enabled though telemedicine  due to Covid 19.   Dietary issues and exercise activities discussed: Current Exercise Habits: The patient does not participate in regular exercise at present, Exercise limited by: None identified Diet (meal preparation, eat out, water intake, caffeinated beverages, dairy products, fruits and vegetables): in general, an "unhealthy" diet. Education provided.   Goals    . DIET - EAT MORE FRUITS AND VEGETABLES      Depression Screen PHQ 2/9 Scores 10/30/2019 09/19/2015 08/26/2015 08/02/2015 05/08/2015 04/19/2015 02/15/2015  PHQ - 2 Score 0 0 0 0 0 0 0  Exception Documentation - Patient refusal - - - - -    Fall Risk Fall Risk  10/30/2019 09/19/2015 08/26/2015 05/08/2015  Falls in the past year? 0 No No No  Number falls in past yr: 0 - - -  Injury with Fall? 0 - - -  Follow up Education provided;Falls prevention discussed - - -   Cognitive Function: Ad8 score reviewed for issues:  Issues making decisions:no  Less interest in hobbies / activities:no  Repeats questions, stories (family complaining):no  Trouble using ordinary gadgets (microwave, computer, phone):no  Forgets the month or year: no  Mismanaging finances:  no  Remembering appts:no  Daily problems with thinking and/or memory:no Ad8 score is=0        Screening Tests Health Maintenance  Topic Date Due  . HEMOGLOBIN A1C  01/04/2020  . INFLUENZA VACCINE  02/18/2020  . PNA vac Low Risk Adult (2 of 2 - PPSV23) 05/07/2020  . OPHTHALMOLOGY EXAM  06/11/2020  . FOOT EXAM  07/05/2020  . MAMMOGRAM  06/13/2021  . Fecal DNA (Cologuard)  07/18/2022  . TETANUS/TDAP  01/01/2027  . DEXA SCAN  Completed  . Hepatitis C Screening  Completed     Plan:    Please schedule your next medicare wellness visit with me in 1 yr.  Continue to eat heart healthy diet (full of fruits, vegetables, whole grains, lean protein, water--limit salt, fat, and sugar intake) and increase physical activity as tolerated.  Continue doing brain stimulating activities (puzzles, reading, adult coloring books, staying active) to keep memory sharp.    I have personally reviewed and noted the following in the patient's chart:   . Medical and social history . Use of alcohol, tobacco or illicit drugs  . Current medications and supplements . Functional ability and status . Nutritional status . Physical activity . Advanced directives . List of other physicians . Hospitalizations, surgeries, and ER visits in previous 12 months . Vitals . Screenings to include cognitive, depression, and falls . Referrals and appointments  In addition, I have reviewed and discussed with patient certain preventive protocols, quality metrics, and best practice recommendations. A written personalized care plan for preventive services as well as general preventive health recommendations were provided to patient.     Naaman Plummer [place], [place]   10/30/2019

## 2019-10-30 ENCOUNTER — Ambulatory Visit (INDEPENDENT_AMBULATORY_CARE_PROVIDER_SITE_OTHER): Payer: Medicare Other | Admitting: *Deleted

## 2019-10-30 ENCOUNTER — Other Ambulatory Visit: Payer: Self-pay

## 2019-10-30 ENCOUNTER — Encounter: Payer: Self-pay | Admitting: *Deleted

## 2019-10-30 DIAGNOSIS — Z0000 Encounter for general adult medical examination without abnormal findings: Secondary | ICD-10-CM

## 2019-10-30 NOTE — Patient Instructions (Signed)
Please schedule your next medicare wellness visit with me in 1 yr.  Continue to eat heart healthy diet (full of fruits, vegetables, whole grains, lean protein, water--limit salt, fat, and sugar intake) and increase physical activity as tolerated.  Continue doing brain stimulating activities (puzzles, reading, adult coloring books, staying active) to keep memory sharp.    Emily Wood , Thank you for taking time to come for your Medicare Wellness Visit. I appreciate your ongoing commitment to your health goals. Please review the following plan we discussed and let me know if I can assist you in the future.   These are the goals we discussed: Goals    . DIET - EAT MORE FRUITS AND VEGETABLES    . Increase physical activity       This is a list of the screening recommended for you and due dates:  Health Maintenance  Topic Date Due  . Hemoglobin A1C  01/04/2020  . Flu Shot  02/18/2020  . Pneumonia vaccines (2 of 2 - PPSV23) 05/07/2020  . Eye exam for diabetics  06/11/2020  . Complete foot exam   07/05/2020  . Mammogram  06/13/2021  . Cologuard (Stool DNA test)  07/18/2022  . Tetanus Vaccine  01/01/2027  . DEXA scan (bone density measurement)  Completed  .  Hepatitis C: One time screening is recommended by Center for Disease Control  (CDC) for  adults born from [birth year] through 1964.   Completed    Preventive Care 90 Years and Older, Female Preventive care refers to lifestyle choices and visits with your health care provider that can promote health and wellness. This includes:  A yearly physical exam. This is also called an annual well check.  Regular dental and eye exams.  Immunizations.  Screening for certain conditions.  Healthy lifestyle choices, such as diet and exercise. What can I expect for my preventive care visit? Physical exam Your health care provider will check:  Height and weight. These may be used to calculate body mass index (BMI), which is a measurement that  tells if you are at a healthy weight.  Heart rate and blood pressure.  Your skin for abnormal spots. Counseling Your health care provider may ask you questions about:  Alcohol, tobacco, and drug use.  Emotional well-being.  Home and relationship well-being.  Sexual activity.  Eating habits.  History of falls.  Memory and ability to understand (cognition).  Work and work Statistician.  Pregnancy and menstrual history. What immunizations do I need?  Influenza (flu) vaccine  This is recommended every year. Tetanus, diphtheria, and pertussis (Tdap) vaccine  You may need a Td booster every 10 years. Varicella (chickenpox) vaccine  You may need this vaccine if you have not already been vaccinated. Zoster (shingles) vaccine  You may need this after age 1. Pneumococcal conjugate (PCV13) vaccine  One dose is recommended after age 63. Pneumococcal polysaccharide (PPSV23) vaccine  One dose is recommended after age 57. Measles, mumps, and rubella (MMR) vaccine  You may need at least one dose of MMR if you were born in 1957 or later. You may also need a second dose. Meningococcal conjugate (MenACWY) vaccine  You may need this if you have certain conditions. Hepatitis A vaccine  You may need this if you have certain conditions or if you travel or work in places where you may be exposed to hepatitis A. Hepatitis B vaccine  You may need this if you have certain conditions or if you travel or work in places  where you may be exposed to hepatitis B. Haemophilus influenzae type b (Hib) vaccine  You may need this if you have certain conditions. You may receive vaccines as individual doses or as more than one vaccine together in one shot (combination vaccines). Talk with your health care provider about the risks and benefits of combination vaccines. What tests do I need? Blood tests  Lipid and cholesterol levels. These may be checked every 5 years, or more frequently  depending on your overall health.  Hepatitis C test.  Hepatitis B test. Screening  Lung cancer screening. You may have this screening every year starting at age 37 if you have a 30-pack-year history of smoking and currently smoke or have quit within the past 15 years.  Colorectal cancer screening. All adults should have this screening starting at age 58 and continuing until age 37. Your health care provider may recommend screening at age 63 if you are at increased risk. You will have tests every 1-10 years, depending on your results and the type of screening test.  Diabetes screening. This is done by checking your blood sugar (glucose) after you have not eaten for a while (fasting). You may have this done every 1-3 years.  Mammogram. This may be done every 1-2 years. Talk with your health care provider about how often you should have regular mammograms.  BRCA-related cancer screening. This may be done if you have a family history of breast, ovarian, tubal, or peritoneal cancers. Other tests  Sexually transmitted disease (STD) testing.  Bone density scan. This is done to screen for osteoporosis. You may have this done starting at age 9. Follow these instructions at home: Eating and drinking  Eat a diet that includes fresh fruits and vegetables, whole grains, lean protein, and low-fat dairy products. Limit your intake of foods with high amounts of sugar, saturated fats, and salt.  Take vitamin and mineral supplements as recommended by your health care provider.  Do not drink alcohol if your health care provider tells you not to drink.  If you drink alcohol: ? Limit how much you have to 0-1 drink a day. ? Be aware of how much alcohol is in your drink. In the [place] one drink equals one 12 oz bottle of beer (355 mL), one 5 oz glass of wine (148 mL), or one 1 oz glass of hard liquor (44 mL). Lifestyle  Take daily care of your teeth and gums.  Stay active. Exercise for at least 30  minutes on 5 or more days each week.  Do not use any products that contain nicotine or tobacco, such as cigarettes, e-cigarettes, and chewing tobacco. If you need help quitting, ask your health care provider.  If you are sexually active, practice safe sex. Use a condom or other form of protection in order to prevent STIs (sexually transmitted infections).  Talk with your health care provider about taking a low-dose aspirin or statin. What's next?  Go to your health care provider once a year for a well check visit.  Ask your health care provider how often you should have your eyes and teeth checked.  Stay up to date on all vaccines. This information is not intended to replace advice given to you by your health care provider. Make sure you discuss any questions you have with your health care provider. Document Revised: 06/30/2018 Document Reviewed: 06/30/2018 Elsevier Patient Education  2020 [employer].

## 2019-10-31 ENCOUNTER — Other Ambulatory Visit: Payer: Self-pay

## 2019-10-31 NOTE — Progress Notes (Deleted)
[place] at [facility] [address] [place], [place] [address] [phone]  Date:  11/01/2019   Name:  Emily Wood   DOB:  1950/12/12   MRN:  [id number]  PCP:  Darreld Mclean, MD    Chief Complaint: No chief complaint on file.   History of Present Illness:  Emily Wood is a 69 y.o. very pleasant female patient who presents with the following:  Patient with history of diabetes, hypertension, chronic venous insufficiency, chronic kidney disease, lymphedema due to chronic venous insufficiency Here today to discuss using a CBG system  Lab Results  Component Value Date   HGBA1C 7.4 (H) 07/06/2019    Amlodipine Aspirin Clonidine Gabapentin HCTZ irbersartan Metformin Metoprolol Potassium  Can update A1c today Most recent labs in December Patient Active Problem List   Diagnosis Date Noted  . Lymphedema 01/25/2017  . Fibroid uterus 01/14/2017  . Hypertensive heart disease without heart failure 10/01/2016  . Chronic kidney disease, stage 3 09/21/2016  . Meralgia paresthetica of left side 08/27/2015  . Diabetes mellitus type 2, controlled ([place]) 08/02/2015  . Chronic venous insufficiency 06/28/2015  . Essential hypertension 02/15/2015  . Peripheral edema 02/15/2015  . Chronic knee pain 02/15/2015    Past Medical History:  Diagnosis Date  . Arthritis   . Diabetes mellitus without complication ([place])   . Hypertension     Past Surgical History:  Procedure Laterality Date  . CHOLECYSTECTOMY    . HERNIA REPAIR    . OTHER SURGICAL HISTORY     scar tissue removal from bowels  . PARTIAL HYSTERECTOMY     Still has ovaries  . UTERINE FIBROID SURGERY      Social History   Tobacco Use  . Smoking status: Never Smoker  . Smokeless tobacco: Never Used  Substance Use Topics  . Alcohol use: No    Alcohol/week: 0.0 standard drinks  . Drug use: No    Family History  Problem Relation Age of Onset   . Hyperlipidemia Mother   . Hyperlipidemia Maternal Aunt   . Breast cancer Cousin     No Known Allergies  Medication list has been reviewed and updated.  Current Outpatient Medications on File Prior to Visit  Medication Sig Dispense Refill  . Albuterol Sulfate (PROAIR RESPICLICK) [id number] (90 Base) MCG/ACT AEPB Inhale 2 puffs into the lungs every 6 (six) hours as needed. (Patient not taking: Reported on 10/30/2019) 1 each 3  . Alpha-Lipoic Acid 300 MG CAPS Take 2 capsules by mouth daily.    Marland Kitchen amLODipine (NORVASC) 5 MG tablet Take 1.5 tablets (7.5 mg total) by mouth daily. 134 tablet 3  . aspirin 81 MG tablet Take 81 mg by mouth daily.    . cloNIDine (CATAPRES) 0.2 MG tablet TAKE 1 TABLET BY MOUTH 3  TIMES DAILY 270 tablet 3  . fluticasone (FLONASE) 50 MCG/ACT nasal spray SPRAY 2 SPRAYS INTO EACH NOSTRIL EVERY DAY 48 g 3  . gabapentin (NEURONTIN) 400 MG capsule TAKE 2 CAPSULES BY MOUTH 3  TIMES DAILY 540 capsule 3  . hydrochlorothiazide (HYDRODIURIL) 25 MG tablet TAKE 1 TABLET BY MOUTH  DAILY 90 tablet 3  . HYDROcodone-homatropine (HYCODAN) 5-1.5 MG/5ML syrup Take 5 mLs by mouth every 8 (eight) hours as needed for cough. (Patient not taking: Reported on 10/30/2019) 60 mL 0  . irbesartan (AVAPRO) 300 MG tablet TAKE 1 TABLET BY MOUTH  DAILY 90 tablet 3  . metFORMIN (GLUCOPHAGE)  1000 MG tablet Take 1 tablet (1,000 mg total) by mouth 2 (two) times daily with a meal. 180 tablet 3  . methimazole (TAPAZOLE) 5 MG tablet Take 1 tablet by mouth daily.    . metoprolol tartrate (LOPRESSOR) 50 MG tablet TAKE 1 TABLET BY MOUTH  TWICE DAILY 180 tablet 3  . nystatin cream (MYCOSTATIN) Apply 1 application topically 2 (two) times daily. 30 g 1  . potassium chloride SA (KLOR-CON) 20 MEQ tablet TAKE 1 TABLET BY MOUTH  DAILY 90 tablet 3   No current facility-administered medications on file prior to visit.    Review of Systems:  As per HPI- otherwise negative.   Physical Examination: There were no vitals  filed for this visit. There were no vitals filed for this visit. There is no height or weight on file to calculate BMI. Ideal Body Weight:    GEN: no acute distress. HEENT: Atraumatic, Normocephalic.  Ears and Nose: No external deformity. CV: RRR, No M/G/R. No JVD. No thrill. No extra heart sounds. PULM: CTA B, no wheezes, crackles, rhonchi. No retractions. No resp. distress. No accessory muscle use. ABD: S, NT, ND, +BS. No rebound. No HSM. EXTR: No c/c/e PSYCH: Normally interactive. Conversant.    Assessment and Plan: *** This visit occurred during the SARS-CoV-2 public health emergency.  Safety protocols were in place, including screening questions prior to the visit, additional usage of staff PPE, and extensive cleaning of exam room while observing appropriate contact time as indicated for disinfecting solutions.   Moderate medical decision making today Signed [name], MD

## 2019-11-01 ENCOUNTER — Ambulatory Visit: Payer: Medicare Other | Admitting: Family Medicine

## 2019-11-06 NOTE — Patient Instructions (Addendum)
It was good to see you today, but I am sorry you are having pain. I will be in touch with your labs as soon as possible We are going to get an urgent MRI of your lower back to make sure you don't have a compressed nerve.  We will work on this asap In the meantime I gave you some hydrocodone pain medication to use as needed, and also prednisone to take for 8 days  This can make your glucose go up, so please monitor this and let me know if over 300- 350  I prescribed the freestyle system for you although I am not sure if your insurance will cover it for you  Please let me know if any change or worsening of your symptoms

## 2019-11-06 NOTE — Progress Notes (Addendum)
[place] [facility] [address], [place], [place] [address] [phone]  Date:  11/08/2019   Name:  Emily Wood   DOB:  Apr 10, 1951   MRN:  [id number]  PCP:  Darreld Mclean, MD    Chief Complaint: Diabetes (would like free style libre ) and Leg Pain (right leg pain with walking,lump on lateral side,  started after covid shot 11/01/2019, radiates down leg)   History of Present Illness:  Emily Wood is a 69 y.o. very pleasant female patient who presents with the following:  In person visit today for this patient with diabetes, chronic kidney disease, hypertension, venous insufficiency and lymphedema, obesity  Last seen by myself in December She sees endocrinology for her thyroid, but I manage her diabetes Her most recent thyroid eval was early this year- she sees Dr Gabriel Carina   Lab Results  Component Value Date   HGBA1C 8.2 (H) 11/08/2019    COVID-19 vaccine- complete  Shingrix Update A1c today Mammogram is up-to-date Cologuard up-to-date Due for labs today  Amlodipine Aspirin Clonidine 3 times daily Gabapentin 3 times daily- she sometimes is just taking it twice a day right now since she is not as active HCTZ Avapro 300 Metformin at thousand twice a day Methimazole Metoprolol Potassium 20 mEq daily OTC omega 3  She got her 2nd dose of covid 19 vaccine on 4/14 The next day her right leg started hurting - in her lower back, buttock, and radiating down her right leg and into her groin Never had this in the past  She is not aware of any injury She is walking with her cane at home but riding in a WC here in my office due to long walk to my exam room She notes that she is having urinary incontinence; this is not a new issue but seems worse since the pain in her back and right hip.  She has simply wet herself on a couple of occasions, and is having urinary incontinence at night She is having occasional  fecal incontinence as well which started last week She does not feel any numbness in her groin or genital area  She also notes a palpable "lump" over her right lateral hip for about a week It is difficult for her to tell if she has weakness of her legs, her pain is such that it is difficult for her to walk  She did have an MRI of her left hip in 2018-  Per pt she had 2 MRI and an CT just recently per Dr Gladstone Lighter; left hip and thigh?   We requested these records, she had an MRI of her left femur on October 23, 2019.  No imaging of her lumbar spine has been done recently per my knowledge She also had a CT of her pelvis in February of this year:  IMPRESSION: 1. 2.4 cm uterine fibroid. 2. Mildly enlarged bilateral inguinal lymph nodes are noted, with the largest being 11 mm in left inguinal region, most likely inflammatory or reactive in etiology.  Dr. Gladstone Lighter is doing cortisone injections in her left knee per patient report-this helped somewhat The current pain is worse if she puts weight on the right hip- she cannot sleep on her right side She needs help getting in and out of bed Her neighbor gave her some muscle relaxer- metaxalone- which did not help She also had some tylenol 3 left over - this does not help  either    She notes that her pain is quite significant, she is requesting medication for pain control  She is also interested in using the freestyle libre CBG monitoring system.  I advised her this may not be covered by her insurance as she does not use insulin, we can prescribe it for her, and she will see if it is covered   BP Readings from Last 3 Encounters:  11/08/19 (!) 142/78  08/15/19 (!) 144/90  08/03/19 (!) 162/80    Patient Active Problem List   Diagnosis Date Noted  . Lymphedema 01/25/2017  . Fibroid uterus 01/14/2017  . Hypertensive heart disease without heart failure 10/01/2016  . Chronic kidney disease, stage 3 09/21/2016  . Meralgia paresthetica of left side  08/27/2015  . Diabetes mellitus type 2, controlled ([place]) 08/02/2015  . Chronic venous insufficiency 06/28/2015  . Essential hypertension 02/15/2015  . Peripheral edema 02/15/2015  . Chronic knee pain 02/15/2015    Past Medical History:  Diagnosis Date  . Arthritis   . Diabetes mellitus without complication ([place])   . Hypertension     Past Surgical History:  Procedure Laterality Date  . CHOLECYSTECTOMY    . HERNIA REPAIR    . OTHER SURGICAL HISTORY     scar tissue removal from bowels  . PARTIAL HYSTERECTOMY     Still has ovaries  . UTERINE FIBROID SURGERY      Social History   Tobacco Use  . Smoking status: Never Smoker  . Smokeless tobacco: Never Used  Substance Use Topics  . Alcohol use: No    Alcohol/week: 0.0 standard drinks  . Drug use: No    Family History  Problem Relation Age of Onset  . Hyperlipidemia Mother   . Hyperlipidemia Maternal Aunt   . Breast cancer Cousin     No Known Allergies  Medication list has been reviewed and updated.  Current Outpatient Medications on File Prior to Visit  Medication Sig Dispense Refill  . Alpha-Lipoic Acid 300 MG CAPS Take 2 capsules by mouth daily.    Marland Kitchen amLODipine (NORVASC) 5 MG tablet Take 1.5 tablets (7.5 mg total) by mouth daily. 134 tablet 3  . aspirin 81 MG tablet Take 81 mg by mouth daily.    . cloNIDine (CATAPRES) 0.2 MG tablet TAKE 1 TABLET BY MOUTH 3  TIMES DAILY 270 tablet 3  . fluticasone (FLONASE) 50 MCG/ACT nasal spray SPRAY 2 SPRAYS INTO EACH NOSTRIL EVERY DAY 48 g 3  . gabapentin (NEURONTIN) 400 MG capsule TAKE 2 CAPSULES BY MOUTH 3  TIMES DAILY 540 capsule 3  . hydrochlorothiazide (HYDRODIURIL) 25 MG tablet TAKE 1 TABLET BY MOUTH  DAILY 90 tablet 3  . irbesartan (AVAPRO) 300 MG tablet TAKE 1 TABLET BY MOUTH  DAILY 90 tablet 3  . metFORMIN (GLUCOPHAGE) 1000 MG tablet Take 1 tablet (1,000 mg total) by mouth 2 (two) times daily with a meal. 180 tablet 3  . metoprolol tartrate (LOPRESSOR) 50 MG tablet  TAKE 1 TABLET BY MOUTH  TWICE DAILY 180 tablet 3  . nystatin cream (MYCOSTATIN) Apply 1 application topically 2 (two) times daily. 30 g 1  . potassium chloride SA (KLOR-CON) 20 MEQ tablet TAKE 1 TABLET BY MOUTH  DAILY 90 tablet 3  . UNABLE TO FIND Med Name: Nyoka Cowden Lipped Muscle oil GLX3    . methimazole (TAPAZOLE) 5 MG tablet Take 1 tablet by mouth daily.     No current facility-administered medications on file prior to visit.  Review of Systems:  As per HPI- otherwise negative.   Physical Examination: Vitals:   11/08/19 1001 11/08/19 1043  BP: (!) 184/76 (!) 142/78  Pulse: 62   Resp: 18   Temp: 98.7 F (37.1 C)   SpO2: 98%    Vitals:   11/08/19 1001  Height: 5\' 5"  (1.651 m)   Body mass index is 41.77 kg/m. Ideal Body Weight: Weight in (lb) to have BMI = 25: 149.9  GEN: no acute distress.  Obese, sitting in wheelchair today HEENT: Atraumatic, Normocephalic.   PEERL Ears and Nose: No external deformity. CV: RRR, No M/G/R. No JVD. No thrill. No extra heart sounds. PULM: CTA B, no wheezes, crackles, rhonchi. No retractions. No resp. distress. No accessory muscle use. ABD: S, NT, ND, +BS. No rebound. No HSM. EXTR: No c/c/e PSYCH: Normally interactive. Conversant.  Patient is able to get her feet with some assistance.  There is no tenderness to palpation of her posterior spine, paraspinous muscles, or sciatic notch.  She does have some fullness in the soft tissue over the right greater trochanter, but no redness or heat Bilateral lower extremity strength is symmetrical although decreased bilaterally. I am not able to get a good deep tendon reflex exam due to patient factors-she is seated in wheelchair which partially reclines, she is not able to get onto table Sensation seems symmetrical in both lower extremities, she denies any numbness of her groin to palpation   Assessment and Plan: Acute right-sided low back pain with right-sided sciatica - Plan: MR Lumbar Spine Wo  Contrast, HYDROcodone-acetaminophen (NORCO/VICODIN) 5-325 MG tablet, predniSONE (DELTASONE) 20 MG tablet  Peripheral edema - Plan: CBC, Basic metabolic panel  Controlled type 2 diabetes mellitus without complication, without long-term current use of insulin ([place]) - Plan: Basic metabolic panel, Hemoglobin A1c, Continuous Blood Gluc Receiver (FREESTYLE LIBRE 14 DAY READER) DEVI, Continuous Blood Gluc Sensor (FREESTYLE LIBRE 2 SENSOR) MISC, Microalbumin / creatinine urine ratio  Essential hypertension - Plan: CBC, Basic metabolic panel  Urinary incontinence, unspecified type - Plan: Urine Culture, POCT urinalysis dipstick  Here today for concern of right-sided lower back pain with numbness, possible weakness, bowel and bladder incontinence I discussed this in detail with patient.  We are concerned she could have cauda equina syndrome.  I have ordered a stat lumbar MRI, we will start her on prednisone for the time being.  I advised her that prednisone may raise her blood sugar levels, she will keep me posted about her home glucose readings Blood pressure better on recheck, patient is in pain Continue amlodipine, HCTZ, metoprolol We will check a UA and urine culture in addition to MRI for urinary incontinence Reviewed MRI of left femur and CT abdomen pelvis from orthopedic office  Medical decision making today high  40 minutes spent face-to-face in patient care and care coordination today This visit occurred during the SARS-CoV-2 public health emergency.  Safety protocols were in place, including screening questions prior to the visit, additional usage of staff PPE, and extensive cleaning of exam room while observing appropriate contact time as indicated for disinfecting solutions.    Signed [name], MD  Results for orders placed or performed in visit on 11/08/19  Urine Culture   Specimen: Blood  Result Value Ref Range   MICRO NUMBER: [id number]    SPECIMEN QUALITY: Adequate     Sample Source NOT GIVEN    STATUS: FINAL    ISOLATE 1: Escherichia coli (A)       Susceptibility  Escherichia coli - URINE CULTURE, REFLEX    AMOX/CLAVULANIC <=2 Sensitive     AMPICILLIN <=2 Sensitive     AMPICILLIN/SULBACTAM <=2 Sensitive     CEFAZOLIN* <=4 Not Reportable      * For infections other than uncomplicated UTIcaused by E. coli, K. pneumoniae or P. mirabilis:Cefazolin is resistant if MIC > or = 8 mcg/mL.(Distinguishing susceptible versus intermediatefor isolates with MIC < or = 4 mcg/mL requiresadditional testing.)For uncomplicated UTI caused by E. coli,K. pneumoniae or P. mirabilis: Cefazolin issusceptible if MIC <32 mcg/mL and predictssusceptible to the oral agents cefaclor, cefdinir,cefpodoxime, cefprozil, cefuroxime, cephalexinand loracarbef.    CEFEPIME <=1 Sensitive     CEFTRIAXONE <=1 Sensitive     CIPROFLOXACIN <=0.25 Sensitive     LEVOFLOXACIN <=0.12 Sensitive     ERTAPENEM <=0.5 Sensitive     GENTAMICIN <=1 Sensitive     IMIPENEM <=0.25 Sensitive     NITROFURANTOIN <=16 Sensitive     PIP/TAZO <=4 Sensitive     TOBRAMYCIN <=1 Sensitive     TRIMETH/SULFA* <=20 Sensitive      * For infections other than uncomplicated UTIcaused by E. coli, K. pneumoniae or P. mirabilis:Cefazolin is resistant if MIC > or = 8 mcg/mL.(Distinguishing susceptible versus intermediatefor isolates with MIC < or = 4 mcg/mL requiresadditional testing.)For uncomplicated UTI caused by E. coli,K. pneumoniae or P. mirabilis: Cefazolin issusceptible if MIC <32 mcg/mL and predictssusceptible to the oral agents cefaclor, cefdinir,cefpodoxime, cefprozil, cefuroxime, cephalexinand loracarbef.Legend:S = Susceptible  I = IntermediateR = Resistant  NS = Not susceptible* = Not tested  NR = Not reported**NN = See antimicrobic comments  CBC  Result Value Ref Range   WBC 8.7 4.0 - 10.5 K/uL   RBC 4.03 3.87 - 5.11 Mil/uL   Platelets 402.0 (H) 150.0 - 400.0 K/uL   Hemoglobin 11.5 (L) 12.0 - 15.0 g/dL   HCT 34.6  (L) 36.0 - 46.0 %   MCV 85.9 78.0 - 100.0 fl   MCHC 33.4 30.0 - 36.0 g/dL   RDW 13.6 11.5 - [id number] %  Basic metabolic panel  Result Value Ref Range   Sodium 136 135 - 145 mEq/L   Potassium 3.6 3.5 - 5.1 mEq/L   Chloride 96 96 - 112 mEq/L   CO2 29 19 - 32 mEq/L   Glucose, Bld 167 (H) 70 - 99 mg/dL   BUN 21 6 - 23 mg/dL   Creatinine, Ser 1.10 0.40 - 1.20 mg/dL   GFR 59.80 (L) >60.00 mL/min   Calcium 10.0 8.4 - 10.5 mg/dL  Hemoglobin A1c  Result Value Ref Range   Hgb A1c MFr Bld 8.2 (H) 4.6 - 6.5 %  Microalbumin / creatinine urine ratio  Result Value Ref Range   Microalb, Ur 203.2 (H) 0.0 - 1.9 mg/dL   Creatinine,U 219.3 mg/dL   Microalb Creat Ratio 92.7 (H) 0.0 - 30.0 mg/g  POCT urinalysis dipstick  Result Value Ref Range   Color, UA yellow yellow   Clarity, UA clear clear   Glucose, UA negative negative mg/dL   Bilirubin, UA small (A) negative   Ketones, POC UA trace (5) (A) negative mg/dL   Spec Grav, UA 1.025 1.010 - 1.025   Blood, UA negative negative   pH, UA 5.0 5.0 - 8.0   Protein Ur, POC >=300 (A) negative mg/dL   Urobilinogen, UA 0.2 0.2 or 1.0 E.U./dL   Nitrite, UA Negative Negative   Leukocytes, UA Negative Negative   Addendum 11/22, received labs as below.  Message to patient  Her MRI is scheduled for tomorrow, 4/23.  This is not ideal but it was the soonest outpatient MRI we could find Will offer patient the option of going to the ER instead  She has been referred to nephrology previously, I am not sure if she is being seen currently however We will query patient and make referral if she is not currently under care  Results for orders placed or performed in visit on 11/08/19  Urine Culture   Specimen: Blood  Result Value Ref Range   MICRO NUMBER: [id number]    SPECIMEN QUALITY: Adequate    Sample Source NOT GIVEN    STATUS: FINAL    ISOLATE 1: Escherichia coli (A)       Susceptibility   Escherichia coli - URINE CULTURE, REFLEX    AMOX/CLAVULANIC <=2  Sensitive     AMPICILLIN <=2 Sensitive     AMPICILLIN/SULBACTAM <=2 Sensitive     CEFAZOLIN* <=4 Not Reportable      * For infections other than uncomplicated UTIcaused by E. coli, K. pneumoniae or P. mirabilis:Cefazolin is resistant if MIC > or = 8 mcg/mL.(Distinguishing susceptible versus intermediatefor isolates with MIC < or = 4 mcg/mL requiresadditional testing.)For uncomplicated UTI caused by E. coli,K. pneumoniae or P. mirabilis: Cefazolin issusceptible if MIC <32 mcg/mL and predictssusceptible to the oral agents cefaclor, cefdinir,cefpodoxime, cefprozil, cefuroxime, cephalexinand loracarbef.    CEFEPIME <=1 Sensitive     CEFTRIAXONE <=1 Sensitive     CIPROFLOXACIN <=0.25 Sensitive     LEVOFLOXACIN <=0.12 Sensitive     ERTAPENEM <=0.5 Sensitive     GENTAMICIN <=1 Sensitive     IMIPENEM <=0.25 Sensitive     NITROFURANTOIN <=16 Sensitive     PIP/TAZO <=4 Sensitive     TOBRAMYCIN <=1 Sensitive     TRIMETH/SULFA* <=20 Sensitive      * For infections other than uncomplicated UTIcaused by E. coli, K. pneumoniae or P. mirabilis:Cefazolin is resistant if MIC > or = 8 mcg/mL.(Distinguishing susceptible versus intermediatefor isolates with MIC < or = 4 mcg/mL requiresadditional testing.)For uncomplicated UTI caused by E. coli,K. pneumoniae or P. mirabilis: Cefazolin issusceptible if MIC <32 mcg/mL and predictssusceptible to the oral agents cefaclor, cefdinir,cefpodoxime, cefprozil, cefuroxime, cephalexinand loracarbef.Legend:S = Susceptible  I = IntermediateR = Resistant  NS = Not susceptible* = Not tested  NR = Not reported**NN = See antimicrobic comments  CBC  Result Value Ref Range   WBC 8.7 4.0 - 10.5 K/uL   RBC 4.03 3.87 - 5.11 Mil/uL   Platelets 402.0 (H) 150.0 - 400.0 K/uL   Hemoglobin 11.5 (L) 12.0 - 15.0 g/dL   HCT 34.6 (L) 36.0 - 46.0 %   MCV 85.9 78.0 - 100.0 fl   MCHC 33.4 30.0 - 36.0 g/dL   RDW 13.6 11.5 - [id number] %  Basic metabolic panel  Result Value Ref Range   Sodium 136 135 -  145 mEq/L   Potassium 3.6 3.5 - 5.1 mEq/L   Chloride 96 96 - 112 mEq/L   CO2 29 19 - 32 mEq/L   Glucose, Bld 167 (H) 70 - 99 mg/dL   BUN 21 6 - 23 mg/dL   Creatinine, Ser 1.10 0.40 - 1.20 mg/dL   GFR 59.80 (L) >60.00 mL/min   Calcium 10.0 8.4 - 10.5 mg/dL  Hemoglobin A1c  Result Value Ref Range   Hgb A1c MFr Bld 8.2 (H) 4.6 - 6.5 %  Microalbumin / creatinine urine ratio  Result Value Ref Range   Microalb, Ur 203.2 (H)  0.0 - 1.9 mg/dL   Creatinine,U 219.3 mg/dL   Microalb Creat Ratio 92.7 (H) 0.0 - 30.0 mg/g  POCT urinalysis dipstick  Result Value Ref Range   Color, UA yellow yellow   Clarity, UA clear clear   Glucose, UA negative negative mg/dL   Bilirubin, UA small (A) negative   Ketones, POC UA trace (5) (A) negative mg/dL   Spec Grav, UA 1.025 1.010 - 1.025   Blood, UA negative negative   pH, UA 5.0 5.0 - 8.0   Protein Ur, POC >=300 (A) negative mg/dL   Urobilinogen, UA 0.2 0.2 or 1.0 E.U./dL   Nitrite, UA Negative Negative   Leukocytes, UA Negative Negative    Your blood count showed minimal anemia, this is actually improved from previous We will monitor for the time being Metabolic profile looks okay Your A1c is high, indicating less than ideal control of your diabetes I would like to add a medication to your Metformin to help control your sugar, perhaps glipizide. I can call this in if it would be okay with you?  Your urine does show abnormal protein; this is likely due to mild kidney damage due to diabetes. We referred you to nephrology-kidney specialist-a few years ago. You recall if you ended up seeing nephrology?  Finally, we were able to get an MRI scheduled for tomorrow. My referral staff tried several locations in an attempt to get an MRI faster. This was the soonest that we could find outpatient. If you prefer to have your MRI done today, you could go through the emergency department at [facility] or [facility]. Otherwise, if your symptoms are not worsening I think  having the MRI tomorrow is acceptable  Addendum 4/24 I received her MRI report yesterday, called patient but did not reach her.  Send her a detailed MyChart message.  Ordered MRI of the abdomen, and neurosurgical consultation  MR Lumbar Spine Wo Contrast  Result Date: 11/10/2019 CLINICAL DATA:  Low back and left leg pain. Loss of bowel and bladder control. No known injury. EXAM: MRI LUMBAR SPINE WITHOUT CONTRAST TECHNIQUE: Multiplanar, multisequence MR imaging of the lumbar spine was performed. No intravenous contrast was administered. COMPARISON:  MRI lumbar spine 12/04/2015. FINDINGS: Segmentation:  Standard. Alignment: Facet degenerative change results in 0.2 cm anterolisthesis L3 on L4 and 0.3 cm anterolisthesis L4 on L5, unchanged. Vertebrae:  No fracture, evidence of discitis, or bone lesion. Conus medullaris and cauda equina: Conus extends to the L1 level. Conus and cauda equina appear normal. Paraspinal and other soft tissues: A mixed signal intensity lesion in the lower pole of the right kidney measures 2.1 cm transverse by 2.3 cm AP by approximately 2 cm craniocaudal. Disc levels: T11-12 and T12-L1 are imaged in the sagittal plane only and negative. L1-2: Negative. L2-3: Mild facet degenerative change.  Otherwise negative. L3-4: Moderate to advanced bilateral facet arthropathy. There is ligamentum flavum thickening and a shallow disc bulge. Moderate to moderately severe central canal stenosis and bilateral subarticular recess narrowing appear unchanged. An annular fissure and new protrusion are seen in the left foramen and result in encroachment on the exiting left L3 root. Mild to moderate right foraminal stenosis is unchanged. L4-5: New protrusions in the right and left foramina encroach on the exiting L4 roots. Moderate to advanced facet arthropathy and a shallow disc bulge are again seen. Mild to moderate central canal stenosis is unchanged. L5-S1: Mild-to-moderate facet arthropathy.  Otherwise  negative. IMPRESSION: Lesion lower pole left kidney is worrisome for  a papillary renal carcinoma. Recommend MR abdomen with and without contrast for further evaluation. New disc protrusion in the left foramen at L3-4 results in encroachment on the exiting left L3 root. Moderate to moderately severe central canal and bilateral subarticular recess narrowing at L3-4 appear unchanged. New disc protrusions in the right and left foramina at L4-5 result in encroachment on the exiting L4 roots. Mild to moderate central canal stenosis is unchanged. No change in mild anterolisthesis L3-4 and L4-5 due to facet arthropathy. These results will be called to the ordering clinician or representative by the Radiologist Assistant, and communication documented in the PACS or [employer]. Electronically Signed   By: Inge Rise M.D.   On: 11/10/2019 12:17   Today received urine culture, positive for E. Coli Message to patient, I prescribed Keflex for 7 days

## 2019-11-07 ENCOUNTER — Other Ambulatory Visit: Payer: Self-pay

## 2019-11-08 ENCOUNTER — Encounter: Payer: Self-pay | Admitting: Family Medicine

## 2019-11-08 ENCOUNTER — Other Ambulatory Visit: Payer: Self-pay

## 2019-11-08 ENCOUNTER — Ambulatory Visit (INDEPENDENT_AMBULATORY_CARE_PROVIDER_SITE_OTHER): Payer: Medicare Other | Admitting: Family Medicine

## 2019-11-08 VITALS — BP 142/78 | HR 62 | Temp 98.7°F | Resp 18 | Ht 65.0 in

## 2019-11-08 DIAGNOSIS — E119 Type 2 diabetes mellitus without complications: Secondary | ICD-10-CM | POA: Diagnosis not present

## 2019-11-08 DIAGNOSIS — M5441 Lumbago with sciatica, right side: Secondary | ICD-10-CM

## 2019-11-08 DIAGNOSIS — R32 Unspecified urinary incontinence: Secondary | ICD-10-CM

## 2019-11-08 DIAGNOSIS — N3000 Acute cystitis without hematuria: Secondary | ICD-10-CM

## 2019-11-08 DIAGNOSIS — R6889 Other general symptoms and signs: Secondary | ICD-10-CM | POA: Diagnosis not present

## 2019-11-08 DIAGNOSIS — I10 Essential (primary) hypertension: Secondary | ICD-10-CM | POA: Diagnosis not present

## 2019-11-08 DIAGNOSIS — R609 Edema, unspecified: Secondary | ICD-10-CM

## 2019-11-08 LAB — POCT URINALYSIS DIP (MANUAL ENTRY)
Blood, UA: NEGATIVE
Glucose, UA: NEGATIVE mg/dL
Leukocytes, UA: NEGATIVE
Nitrite, UA: NEGATIVE
Protein Ur, POC: >=300 mg/dL — AB
Spec Grav, UA: 1.025 (ref 1.010–1.025)
Urobilinogen, UA: 0.2 E.U./dL
pH, UA: 5 (ref 5.0–8.0)

## 2019-11-08 LAB — BASIC METABOLIC PANEL
BUN: 21 mg/dL (ref 6–23)
CO2: 29 mEq/L (ref 19–32)
Calcium: 10 mg/dL (ref 8.4–10.5)
Chloride: 96 mEq/L (ref 96–112)
Creatinine, Ser: 1.1 mg/dL (ref 0.40–1.20)
GFR: 59.8 mL/min — ABNORMAL LOW (ref >60.00)
Glucose, Bld: 167 mg/dL — ABNORMAL HIGH (ref 70–99)
Potassium: 3.6 mEq/L (ref 3.5–5.1)
Sodium: 136 mEq/L (ref 135–145)

## 2019-11-08 LAB — MICROALBUMIN / CREATININE URINE RATIO
Creatinine,U: 219.3 mg/dL
Microalb Creat Ratio: 92.7 mg/g — ABNORMAL HIGH (ref 0.0–30.0)
Microalb, Ur: 203.2 mg/dL — ABNORMAL HIGH (ref 0.0–1.9)

## 2019-11-08 LAB — HEMOGLOBIN A1C: Hgb A1c MFr Bld: 8.2 % — ABNORMAL HIGH (ref 4.6–6.5)

## 2019-11-08 MED ORDER — FREESTYLE LIBRE 14 DAY READER DEVI: 1.0000 | 6 refills | Status: DC

## 2019-11-08 MED ORDER — PREDNISONE 20 MG PO TABS: ORAL_TABLET | ORAL | 0 refills | Status: DC

## 2019-11-08 MED ORDER — HYDROCODONE-ACETAMINOPHEN 5-325 MG PO TABS: 1.0000 | ORAL_TABLET | Freq: Three times a day (TID) | ORAL | 0 refills | Status: AC | PRN

## 2019-11-08 MED ORDER — FREESTYLE LIBRE 2 SENSOR MISC: 1.0000 | Freq: Every day | 1 refills | Status: DC

## 2019-11-09 ENCOUNTER — Encounter: Payer: Self-pay | Admitting: Family Medicine

## 2019-11-09 DIAGNOSIS — E119 Type 2 diabetes mellitus without complications: Secondary | ICD-10-CM

## 2019-11-09 LAB — CBC
HCT: 34.6 % — ABNORMAL LOW (ref 36.0–46.0)
Hemoglobin: 11.5 g/dL — ABNORMAL LOW (ref 12.0–15.0)
MCHC: 33.4 g/dL (ref 30.0–36.0)
MCV: 85.9 fl (ref 78.0–100.0)
Platelets: 402 10*3/uL — ABNORMAL HIGH (ref 150.0–400.0)
RBC: 4.03 Mil/uL (ref 3.87–5.11)
RDW: 13.6 % (ref 11.5–15.5)
WBC: 8.7 10*3/uL (ref 4.0–10.5)

## 2019-11-10 ENCOUNTER — Encounter: Payer: Self-pay | Admitting: Family Medicine

## 2019-11-10 ENCOUNTER — Ambulatory Visit (HOSPITAL_COMMUNITY)
Admission: RE | Admit: 2019-11-10 | Discharge: 2019-11-10 | Disposition: A | Discharge status: Home / Self Care | Payer: Medicare Other | Source: Ambulatory Visit | Attending: Family Medicine | Admitting: Family Medicine

## 2019-11-10 ENCOUNTER — Other Ambulatory Visit: Payer: Self-pay

## 2019-11-10 ENCOUNTER — Telehealth: Payer: Self-pay

## 2019-11-10 ENCOUNTER — Telehealth: Payer: Self-pay | Admitting: Family Medicine

## 2019-11-10 DIAGNOSIS — M5441 Lumbago with sciatica, right side: Secondary | ICD-10-CM | POA: Insufficient documentation

## 2019-11-10 DIAGNOSIS — N2889 Other specified disorders of kidney and ureter: Secondary | ICD-10-CM

## 2019-11-10 DIAGNOSIS — R6889 Other general symptoms and signs: Secondary | ICD-10-CM | POA: Diagnosis not present

## 2019-11-10 DIAGNOSIS — M5116 Intervertebral disc disorders with radiculopathy, lumbar region: Secondary | ICD-10-CM

## 2019-11-10 DIAGNOSIS — M48061 Spinal stenosis, lumbar region without neurogenic claudication: Secondary | ICD-10-CM | POA: Diagnosis not present

## 2019-11-10 LAB — URINE CULTURE
MICRO NUMBER:: 10389522
SPECIMEN QUALITY:: ADEQUATE

## 2019-11-10 NOTE — Telephone Encounter (Signed)
Received her MRI report as below-called patient  Did not reach her, left message on machine that I will send her a MyChart message  MR Lumbar Spine Wo Contrast  Result Date: 11/10/2019 CLINICAL DATA:  Low back and left leg pain. Loss of bowel and bladder control. No known injury. EXAM: MRI LUMBAR SPINE WITHOUT CONTRAST TECHNIQUE: Multiplanar, multisequence MR imaging of the lumbar spine was performed. No intravenous contrast was administered. COMPARISON:  MRI lumbar spine 12/04/2015. FINDINGS: Segmentation:  Standard. Alignment: Facet degenerative change results in 0.2 cm anterolisthesis L3 on L4 and 0.3 cm anterolisthesis L4 on L5, unchanged. Vertebrae:  No fracture, evidence of discitis, or bone lesion. Conus medullaris and cauda equina: Conus extends to the L1 level. Conus and cauda equina appear normal. Paraspinal and other soft tissues: A mixed signal intensity lesion in the lower pole of the right kidney measures 2.1 cm transverse by 2.3 cm AP by approximately 2 cm craniocaudal. Disc levels: T11-12 and T12-L1 are imaged in the sagittal plane only and negative. L1-2: Negative. L2-3: Mild facet degenerative change.  Otherwise negative. L3-4: Moderate to advanced bilateral facet arthropathy. There is ligamentum flavum thickening and a shallow disc bulge. Moderate to moderately severe central canal stenosis and bilateral subarticular recess narrowing appear unchanged. An annular fissure and new protrusion are seen in the left foramen and result in encroachment on the exiting left L3 root. Mild to moderate right foraminal stenosis is unchanged. L4-5: New protrusions in the right and left foramina encroach on the exiting L4 roots. Moderate to advanced facet arthropathy and a shallow disc bulge are again seen. Mild to moderate central canal stenosis is unchanged. L5-S1: Mild-to-moderate facet arthropathy.  Otherwise negative. IMPRESSION: Lesion lower pole left kidney is worrisome for a papillary renal  carcinoma. Recommend MR abdomen with and without contrast for further evaluation. New disc protrusion in the left foramen at L3-4 results in encroachment on the exiting left L3 root. Moderate to moderately severe central canal and bilateral subarticular recess narrowing at L3-4 appear unchanged. New disc protrusions in the right and left foramina at L4-5 result in encroachment on the exiting L4 roots. Mild to moderate central canal stenosis is unchanged. No change in mild anterolisthesis L3-4 and L4-5 due to facet arthropathy. These results will be called to the ordering clinician or representative by the Radiologist Assistant, and communication documented in the PACS or [employer]. Electronically Signed   By: Inge Rise M.D.   On: 11/10/2019 12:17   Placed orders for MRI abdomen, neurosurgery evaluation

## 2019-11-11 ENCOUNTER — Encounter: Payer: Self-pay | Admitting: Family Medicine

## 2019-11-11 MED ORDER — CEPHALEXIN 500 MG PO CAPS: 500.0000 mg | ORAL_CAPSULE | Freq: Two times a day (BID) | ORAL | 0 refills | Status: DC

## 2019-11-11 NOTE — Addendum Note (Signed)
Addended by: Darreld Mclean on: 11/11/2019 06:39 AM   Modules accepted: Orders

## 2019-11-12 ENCOUNTER — Telehealth: Payer: Self-pay | Admitting: Family Medicine

## 2019-11-12 MED ORDER — OXYCODONE-ACETAMINOPHEN 5-325 MG PO TABS: 1.0000 | ORAL_TABLET | Freq: Four times a day (QID) | ORAL | 0 refills | Status: DC | PRN

## 2019-11-12 NOTE — Telephone Encounter (Signed)
Called pt as she had not yet read her mychart messages.  Went over urine culture and MRI of her spine Referral to NSG Treat UTI She is still having a lot of pain- rx for oxycodone sent in to use in place of hydrocodone which is not relieving her pain  She will check her mychart and read her reports for more details MRI abdomen ordered to eval renal lesion  Meds ordered this encounter  Medications  . oxyCODONE-acetaminophen (PERCOCET/ROXICET) 5-325 MG tablet    Sig: Take 1 tablet by mouth every 6 (six) hours as needed for severe pain.    Dispense:  20 tablet    Refill:  0

## 2019-11-13 MED ORDER — GLIPIZIDE 5 MG PO TABS: 5.0000 mg | ORAL_TABLET | Freq: Every day | ORAL | 5 refills | Status: DC

## 2019-11-16 ENCOUNTER — Telehealth: Payer: Self-pay | Admitting: Family Medicine

## 2019-11-16 ENCOUNTER — Encounter: Payer: Self-pay | Admitting: Family Medicine

## 2019-11-16 DIAGNOSIS — R6889 Other general symptoms and signs: Secondary | ICD-10-CM | POA: Diagnosis not present

## 2019-11-16 DIAGNOSIS — M5126 Other intervertebral disc displacement, lumbar region: Secondary | ICD-10-CM | POA: Diagnosis not present

## 2019-11-16 DIAGNOSIS — M4310 Spondylolisthesis, site unspecified: Secondary | ICD-10-CM | POA: Diagnosis not present

## 2019-11-16 DIAGNOSIS — N2889 Other specified disorders of kidney and ureter: Secondary | ICD-10-CM

## 2019-11-16 NOTE — Telephone Encounter (Signed)
Patient says that Dr. Lorelei Pont was going to send in referral for a urologist to get her kidney checked.  I don't see a referral in the chart. Please advise if one can be placed. Thanks

## 2019-11-16 NOTE — Telephone Encounter (Signed)
Dr. Lorelei Pont im not seeing a message where a urology referral was mentioned. Could you please advise?

## 2019-11-17 ENCOUNTER — Telehealth: Payer: Self-pay

## 2019-11-17 NOTE — Telephone Encounter (Signed)
Patient called in to update Dr. Lorelei Pont and the nurse that [facility] imaging do not have an appointment until Dec 16, 2019 the patient would like to request that Dr. Lorelei Pont put in a stat order for an MRI. Please follow up with the patient and advise at [phone]

## 2019-11-17 NOTE — Telephone Encounter (Signed)
Referral changed to emergent, Emily Wood working referral.

## 2019-11-17 NOTE — Telephone Encounter (Signed)
Ok to change to stat

## 2019-11-17 NOTE — Telephone Encounter (Signed)
Patient notified she is to call [facility] to directly schedule. Gave her the number to call. Patient verbalized understanding and will do so.

## 2019-11-17 NOTE — Telephone Encounter (Signed)
[facility] able to schedule for 5/1 5pm. Emily Wood is going to call pt to schedule and screen. Referral updated. No auth required.

## 2019-11-18 ENCOUNTER — Other Ambulatory Visit: Payer: Self-pay

## 2019-11-18 ENCOUNTER — Ambulatory Visit (HOSPITAL_BASED_OUTPATIENT_CLINIC_OR_DEPARTMENT_OTHER)
Admission: RE | Admit: 2019-11-18 | Discharge: 2019-11-18 | Disposition: A | Discharge status: Home / Self Care | Payer: Medicare Other | Source: Ambulatory Visit | Attending: Family Medicine | Admitting: Family Medicine

## 2019-11-18 DIAGNOSIS — N2889 Other specified disorders of kidney and ureter: Secondary | ICD-10-CM | POA: Insufficient documentation

## 2019-11-18 MED ORDER — GADOBUTROL 1 MMOL/ML IV SOLN
10.0000 mL | Freq: Once | INTRAVENOUS | Status: AC | PRN
  Administered 2019-11-18: 10 mL via INTRAVENOUS

## 2019-11-22 DIAGNOSIS — N2889 Other specified disorders of kidney and ureter: Secondary | ICD-10-CM | POA: Diagnosis not present

## 2019-11-22 DIAGNOSIS — R350 Frequency of micturition: Secondary | ICD-10-CM | POA: Diagnosis not present

## 2019-11-22 DIAGNOSIS — Z87440 Personal history of urinary (tract) infections: Secondary | ICD-10-CM | POA: Diagnosis not present

## 2019-11-22 MED ORDER — OXYCODONE-ACETAMINOPHEN 5-325 MG PO TABS: 1.0000 | ORAL_TABLET | Freq: Four times a day (QID) | ORAL | 0 refills | Status: DC | PRN

## 2019-11-22 NOTE — Addendum Note (Signed)
Addended by: [name] C on: 11/22/2019 12:28 PM   Modules accepted: Orders

## 2019-11-24 ENCOUNTER — Encounter: Payer: Self-pay | Admitting: Family Medicine

## 2019-12-05 DIAGNOSIS — M5126 Other intervertebral disc displacement, lumbar region: Secondary | ICD-10-CM | POA: Diagnosis not present

## 2019-12-05 DIAGNOSIS — M5416 Radiculopathy, lumbar region: Secondary | ICD-10-CM | POA: Diagnosis not present

## 2019-12-05 DIAGNOSIS — R6889 Other general symptoms and signs: Secondary | ICD-10-CM | POA: Diagnosis not present

## 2019-12-12 ENCOUNTER — Other Ambulatory Visit: Payer: Self-pay | Admitting: Family Medicine

## 2019-12-12 ENCOUNTER — Encounter: Payer: Self-pay | Admitting: Family Medicine

## 2019-12-12 MED ORDER — OXYCODONE-ACETAMINOPHEN 5-325 MG PO TABS: 1.0000 | ORAL_TABLET | Freq: Four times a day (QID) | ORAL | 0 refills | Status: DC | PRN

## 2019-12-16 ENCOUNTER — Other Ambulatory Visit: Payer: Medicare Other

## 2019-12-22 DIAGNOSIS — M5416 Radiculopathy, lumbar region: Secondary | ICD-10-CM | POA: Diagnosis not present

## 2019-12-22 DIAGNOSIS — M5126 Other intervertebral disc displacement, lumbar region: Secondary | ICD-10-CM | POA: Diagnosis not present

## 2019-12-29 DIAGNOSIS — E0500 Thyrotoxicosis with diffuse goiter without thyrotoxic crisis or storm: Secondary | ICD-10-CM | POA: Diagnosis not present

## 2020-01-05 ENCOUNTER — Ambulatory Visit
Admission: RE | Admit: 2020-01-05 | Discharge: 2020-01-05 | Disposition: A | Discharge status: Home / Self Care | Payer: Medicare Other | Source: Ambulatory Visit | Attending: Internal Medicine | Admitting: Internal Medicine

## 2020-01-05 ENCOUNTER — Other Ambulatory Visit: Payer: Self-pay

## 2020-01-05 ENCOUNTER — Other Ambulatory Visit (HOSPITAL_COMMUNITY): Payer: Self-pay | Admitting: Internal Medicine

## 2020-01-05 ENCOUNTER — Other Ambulatory Visit: Payer: Self-pay | Admitting: Internal Medicine

## 2020-01-05 DIAGNOSIS — M79661 Pain in right lower leg: Secondary | ICD-10-CM | POA: Diagnosis not present

## 2020-01-05 DIAGNOSIS — R600 Localized edema: Secondary | ICD-10-CM | POA: Diagnosis not present

## 2020-01-05 DIAGNOSIS — E0500 Thyrotoxicosis with diffuse goiter without thyrotoxic crisis or storm: Secondary | ICD-10-CM | POA: Diagnosis not present

## 2020-02-21 DIAGNOSIS — I10 Essential (primary) hypertension: Secondary | ICD-10-CM | POA: Diagnosis not present

## 2020-02-21 DIAGNOSIS — M5416 Radiculopathy, lumbar region: Secondary | ICD-10-CM | POA: Diagnosis not present

## 2020-02-21 DIAGNOSIS — M5126 Other intervertebral disc displacement, lumbar region: Secondary | ICD-10-CM | POA: Diagnosis not present

## 2020-03-19 ENCOUNTER — Other Ambulatory Visit: Payer: Self-pay | Admitting: Family Medicine

## 2020-03-19 DIAGNOSIS — R609 Edema, unspecified: Secondary | ICD-10-CM

## 2020-03-19 DIAGNOSIS — I10 Essential (primary) hypertension: Secondary | ICD-10-CM

## 2020-04-14 ENCOUNTER — Encounter: Payer: Self-pay | Admitting: Family Medicine

## 2020-04-14 DIAGNOSIS — E0500 Thyrotoxicosis with diffuse goiter without thyrotoxic crisis or storm: Secondary | ICD-10-CM | POA: Insufficient documentation

## 2020-04-14 HISTORY — DX: Thyrotoxicosis with diffuse goiter without thyrotoxic crisis or storm: E05.00

## 2020-04-14 NOTE — Progress Notes (Addendum)
[place] at [facility] [address] [place], [place] [address] [phone]  Date:  04/17/2020   Name:  Emily Wood   DOB:  1950/08/15   MRN:  [id number]  PCP:  Darreld Mclean, MD    Chief Complaint: Annual Exam (flu shot)   History of Present Illness:  Emily Wood is a 69 y.o. very pleasant female patient who presents with the following:  Patient today for physical exam- diabetes, chronic kidney disease, hypertension, venous insufficiency and lymphedema, obesity, Graves' hyperthyroidism in remission Her thyroid disease is managed by endocrinology, she no longer sees nephrology Last seen by myself in April of this year  She saw her endocrinologist, Dr. Gabriel Carina most recently in [place]' disease is currently in remission.  She does not manage her DM however at this point  Reassured her that hyperthyroidism is resolved. Counseled her that this is considered remission of Graves' Disease and thus it could return in future. She should monitor for symptoms of hyperthyroidism, which we reviewed together today. She is welcome to follow up PRN however as her remission may last years and years, there is no role for routine screening of thyroid function at this time if she is asymptomatic.   She was seen by urology in May with concern for right renal mass, they plan to reimage in 6 months= November  COVID-19 vaccine- primary series done, she plans to do booster next month  Flu vaccine- give today  Most recent A1c in April, elevated at 8.2  Today pt notes she seems to be suffering from depression. She is more irritable, snappish with her husband.  She does not have a lot of social support Her sister passed away 2 years ago and this is still really hard for her - she was a huge part of her life She has felt down for maybe 4 - 5 months She has been under more stress with the pandemic She finds herself thinking about  death a lot, perseverating some about who will get her things after she passes away, she notes that she does not have many friends locally, she feels lonely She denies any suicidal ideation, will be interested in treatment for depression at this time  She has chronic back pain- epidural steroid injections have not helped her much, she is now using tylenol She does not want to have spine surgery if she can.  She will need a knee replacement - her ortho is Dr Lawernce Pitts   BP Readings from Last 3 Encounters:  04/17/20 130/70  11/08/19 (!) 142/78  08/15/19 (!) 144/90    Patient Active Problem List   Diagnosis Date Noted  . Graves disease 04/14/2020  . Lymphedema 01/25/2017  . Fibroid uterus 01/14/2017  . Hypertensive heart disease without heart failure 10/01/2016  . Chronic kidney disease, stage 3 09/21/2016  . Meralgia paresthetica of left side 08/27/2015  . Diabetes mellitus type 2, controlled ([place]) 08/02/2015  . Chronic venous insufficiency 06/28/2015  . Essential hypertension 02/15/2015  . Peripheral edema 02/15/2015  . Chronic knee pain 02/15/2015    Past Medical History:  Diagnosis Date  . Arthritis   . Diabetes mellitus without complication ([place])   . Graves disease 04/14/2020  . Hypertension     Past Surgical History:  Procedure Laterality Date  . CHOLECYSTECTOMY    . HERNIA REPAIR    . OTHER SURGICAL HISTORY     scar tissue removal from bowels  .  PARTIAL HYSTERECTOMY     Still has ovaries  . UTERINE FIBROID SURGERY      Social History   Tobacco Use  . Smoking status: Never Smoker  . Smokeless tobacco: Never Used  Vaping Use  . Vaping Use: Never used  Substance Use Topics  . Alcohol use: No    Alcohol/week: 0.0 standard drinks  . Drug use: No    Family History  Problem Relation Age of Onset  . Hyperlipidemia Mother   . Hyperlipidemia Maternal Aunt   . Breast cancer Cousin     No Known Allergies  Medication list has been reviewed and  updated.  Current Outpatient Medications on File Prior to Visit  Medication Sig Dispense Refill  . Alpha-Lipoic Acid 300 MG CAPS Take 2 capsules by mouth daily.    Marland Kitchen amLODipine (NORVASC) 5 MG tablet Take 1.5 tablets (7.5 mg total) by mouth daily. 134 tablet 3  . aspirin 81 MG tablet Take 81 mg by mouth daily.    . cephALEXin (KEFLEX) 500 MG capsule Take 1 capsule (500 mg total) by mouth 2 (two) times daily. 14 capsule 0  . cloNIDine (CATAPRES) 0.2 MG tablet TAKE 1 TABLET BY MOUTH 3  TIMES DAILY 270 tablet 3  . fluticasone (FLONASE) 50 MCG/ACT nasal spray SPRAY 2 SPRAYS INTO EACH NOSTRIL EVERY DAY 48 g 3  . gabapentin (NEURONTIN) 400 MG capsule TAKE 2 CAPSULES BY MOUTH 3  TIMES DAILY 540 capsule 3  . glipiZIDE (GLUCOTROL) 5 MG tablet Take 1 tablet (5 mg total) by mouth daily before breakfast. 30 tablet 5  . hydrochlorothiazide (HYDRODIURIL) 25 MG tablet TAKE 1 TABLET BY MOUTH  DAILY 90 tablet 3  . irbesartan (AVAPRO) 300 MG tablet TAKE 1 TABLET BY MOUTH  DAILY 90 tablet 3  . metFORMIN (GLUCOPHAGE) 1000 MG tablet Take 1 tablet (1,000 mg total) by mouth 2 (two) times daily with a meal. 180 tablet 3  . metoprolol tartrate (LOPRESSOR) 50 MG tablet TAKE 1 TABLET BY MOUTH  TWICE DAILY 180 tablet 3  . nystatin cream (MYCOSTATIN) Apply 1 application topically 2 (two) times daily. 30 g 1  . potassium chloride SA (KLOR-CON) 20 MEQ tablet TAKE 1 TABLET BY MOUTH  DAILY 90 tablet 3  . UNABLE TO FIND Med Name: Nyoka Cowden Lipped Muscle oil GLX3     No current facility-administered medications on file prior to visit.    Review of Systems:  As per HPI- otherwise negative.   Physical Examination: Vitals:   04/17/20 0916 04/17/20 0959  BP: (!) 150/70 130/70  Pulse: 60   Resp: 17   SpO2: 97%    Vitals:   04/17/20 0916  Weight: 241 lb (109.3 kg)  Height: 5\' 5"  (1.651 m)   Body mass index is 40.1 kg/m. Ideal Body Weight: Weight in (lb) to have BMI = 25: 149.9  GEN: no acute distress.  Obese, looks  well  HEENT: Atraumatic, Normocephalic.  Ears and Nose: No external deformity. CV: RRR, No M/G/R. No JVD. No thrill. No extra heart sounds. PULM: CTA B, no wheezes, crackles, rhonchi. No retractions. No resp. distress. No accessory muscle use. ABD: S, NT, ND, +BS. No rebound. No HSM. EXTR: No c/c/e PSYCH: Normally interactive. Conversant.  Uses a cane due to right knee pain  Assessment and Plan: Physical exam  Graves disease  Controlled type 2 diabetes mellitus without complication, without long-term current use of insulin ([place]) - Plan: Hemoglobin A1c  Peripheral edema  Essential hypertension - Plan: CBC,  Comprehensive metabolic panel  Meralgia paresthetica of left side  Needs flu shot - Plan: Flu Vaccine QUAD High Dose(Fluad)  Current moderate episode of major depressive disorder without prior episode ([place]) - Plan: FLUoxetine (PROZAC) 20 MG capsule  Patient today for a physical and a few other concerns Her main problem today actually seems to be depression.  She expresses feelings of hopelessness, low energy, lack of social support.  After discussion she would like to start on fluoxetine.  Prescribed 20 mg, increase to 40 after 2 to 3 weeks as tolerated.  Went over most common side effects.  I also encouraged her to consider seeing a counselor, and to work on expanding her social circle Asked her to see me in 1 month for follow-up Flu shot given Health maintenance reviewed Will plan further follow- up pending labs.  This visit occurred during the SARS-CoV-2 public health emergency.  Safety protocols were in place, including screening questions prior to the visit, additional usage of staff PPE, and extensive cleaning of exam room while observing appropriate contact time as indicated for disinfecting solutions.    Signed [name], MD  Addendum 9/30, received her labs as below.  Message to patient  Results for orders placed or performed in visit on 04/17/20  Hemoglobin  A1c  Result Value Ref Range   Hgb A1c MFr Bld 6.5 (H) <5.7 % of total Hgb   Mean Plasma Glucose 140 (calc)   eAG (mmol/L) 7.7 (calc)  CBC  Result Value Ref Range   WBC 5.7 3.8 - 10.8 Thousand/uL   RBC 3.91 3.80 - 5.10 Million/uL   Hemoglobin 10.9 (L) 11.7 - 15.5 g/dL   HCT 33.2 (L) 35 - 45 %   MCV 84.9 80.0 - 100.0 fL   MCH 27.9 27.0 - 33.0 pg   MCHC 32.8 32.0 - 36.0 g/dL   RDW 12.6 11.0 - 15.0 %   Platelets 334 140 - 400 Thousand/uL   MPV 11.4 7.5 - 12.5 fL  Comprehensive metabolic panel  Result Value Ref Range   Glucose, Bld 100 (H) 65 - 99 mg/dL   BUN 15 7 - 25 mg/dL   Creat 1.07 (H) 0.50 - 0.99 mg/dL   BUN/Creatinine Ratio 14 6 - 22 (calc)   Sodium 140 135 - 146 mmol/L   Potassium 3.9 3.5 - 5.3 mmol/L   Chloride 102 98 - 110 mmol/L   CO2 26 20 - 32 mmol/L   Calcium 9.7 8.6 - 10.4 mg/dL   Total Protein 7.9 6.1 - 8.1 g/dL   Albumin 4.0 3.6 - 5.1 g/dL   Globulin 3.9 (H) 1.9 - 3.7 g/dL (calc)   AG Ratio 1.0 1.0 - 2.5 (calc)   Total Bilirubin 0.8 0.2 - 1.2 mg/dL   Alkaline phosphatase (APISO) 95 37 - 153 U/L   AST 14 10 - 35 U/L   ALT 9 6 - 29 U/L   A1c is in desired range For some reason patient's previous labs are not visible and results review t.  Her hemoglobin in April of this year was 11.5 Kidney function is stable

## 2020-04-15 NOTE — Patient Instructions (Addendum)
Good to see you again today!  I will be in touch with your labs  If your A1c continues to be quite high we may want to have your endocrinologist help [place] out with your diabetes care Flu shot given today Please plan to get a covid 19 booster next month   We will start you on fluoxetine (generic prozac) 20 mg a day for depression.  Please increase to 40 mg after 2-3 weeks assuming well tolerated.  Please see me in one month- if you are NOT doing ok or getting worse in the meantime please let me know  I do think you should think about getting your knee replacement done- this will likely improve your quality of life   Health Maintenance After Age 66 After age 28, you are at a higher risk for certain long-term diseases and infections as well as injuries from falls. Falls are a major cause of broken bones and head injuries in people who are older than age 34. Getting regular preventive care can help to keep you healthy and well. Preventive care includes getting regular testing and making lifestyle changes as recommended by your health care provider. Talk with your health care provider about:  Which screenings and tests you should have. A screening is a test that checks for a disease when you have no symptoms.  A diet and exercise plan that is right for you. What should I know about screenings and tests to prevent falls? Screening and testing are the best ways to find a health problem early. Early diagnosis and treatment give you the best chance of managing medical conditions that are common after age 6. Certain conditions and lifestyle choices may make you more likely to have a fall. Your health care provider may recommend:  Regular vision checks. Poor vision and conditions such as cataracts can make you more likely to have a fall. If you wear glasses, make sure to get your prescription updated if your vision changes.  Medicine review. Work with your health care provider to regularly review all of the  medicines you are taking, including over-the-counter medicines. Ask your health care provider about any side effects that may make you more likely to have a fall. Tell your health care provider if any medicines that you take make you feel dizzy or sleepy.  Osteoporosis screening. Osteoporosis is a condition that causes the bones to get weaker. This can make the bones weak and cause them to break more easily.  Blood pressure screening. Blood pressure changes and medicines to control blood pressure can make you feel dizzy.  Strength and balance checks. Your health care provider may recommend certain tests to check your strength and balance while standing, walking, or changing positions.  Foot health exam. Foot pain and numbness, as well as not wearing proper footwear, can make you more likely to have a fall.  Depression screening. You may be more likely to have a fall if you have a fear of falling, feel emotionally low, or feel unable to do activities that you used to do.  Alcohol use screening. Using too much alcohol can affect your balance and may make you more likely to have a fall. What actions can I take to lower my risk of falls? General instructions  Talk with your health care provider about your risks for falling. Tell your health care provider if: ? You fall. Be sure to tell your health care provider about all falls, even ones that seem minor. ? You feel  dizzy, sleepy, or off-balance.  Take over-the-counter and prescription medicines only as told by your health care provider. These include any supplements.  Eat a healthy diet and maintain a healthy weight. A healthy diet includes low-fat dairy products, low-fat (lean) meats, and fiber from whole grains, beans, and lots of fruits and vegetables. Home safety  Remove any tripping hazards, such as rugs, cords, and clutter.  Install safety equipment such as grab bars in bathrooms and safety rails on stairs.  Keep rooms and walkways  well-lit. Activity   Follow a regular exercise program to stay fit. This will help you maintain your balance. Ask your health care provider what types of exercise are appropriate for you.  If you need a cane or walker, use it as recommended by your health care provider.  Wear supportive shoes that have nonskid soles. Lifestyle  Do not drink alcohol if your health care provider tells you not to drink.  If you drink alcohol, limit how much you have: ? 0-1 drink a day for women. ? 0-2 drinks a day for men.  Be aware of how much alcohol is in your drink. In the [place] one drink equals one typical bottle of beer (12 oz), one-half glass of wine (5 oz), or one shot of hard liquor (1 oz).  Do not use any products that contain nicotine or tobacco, such as cigarettes and e-cigarettes. If you need help quitting, ask your health care provider. Summary  Having a healthy lifestyle and getting preventive care can help to protect your health and wellness after age 71.  Screening and testing are the best way to find a health problem early and help you avoid having a fall. Early diagnosis and treatment give you the best chance for managing medical conditions that are more common for people who are older than age 37.  Falls are a major cause of broken bones and head injuries in people who are older than age 34. Take precautions to prevent a fall at home.  Work with your health care provider to learn what changes you can make to improve your health and wellness and to prevent falls. This information is not intended to replace advice given to you by your health care provider. Make sure you discuss any questions you have with your health care provider. Document Revised: 10/27/2018 Document Reviewed: 05/19/2017 Elsevier Patient Education  2020 [employer].

## 2020-04-17 ENCOUNTER — Encounter: Payer: Self-pay | Admitting: Family Medicine

## 2020-04-17 ENCOUNTER — Other Ambulatory Visit: Payer: Self-pay

## 2020-04-17 ENCOUNTER — Ambulatory Visit (INDEPENDENT_AMBULATORY_CARE_PROVIDER_SITE_OTHER): Payer: Medicare Other | Admitting: Family Medicine

## 2020-04-17 VITALS — BP 130/70 | HR 60 | Resp 17 | Ht 65.0 in | Wt 241.0 lb

## 2020-04-17 DIAGNOSIS — E119 Type 2 diabetes mellitus without complications: Secondary | ICD-10-CM | POA: Diagnosis not present

## 2020-04-17 DIAGNOSIS — E0500 Thyrotoxicosis with diffuse goiter without thyrotoxic crisis or storm: Secondary | ICD-10-CM

## 2020-04-17 DIAGNOSIS — D649 Anemia, unspecified: Secondary | ICD-10-CM | POA: Diagnosis not present

## 2020-04-17 DIAGNOSIS — R609 Edema, unspecified: Secondary | ICD-10-CM

## 2020-04-17 DIAGNOSIS — Z23 Encounter for immunization: Secondary | ICD-10-CM

## 2020-04-17 DIAGNOSIS — F321 Major depressive disorder, single episode, moderate: Secondary | ICD-10-CM

## 2020-04-17 DIAGNOSIS — I10 Essential (primary) hypertension: Secondary | ICD-10-CM | POA: Diagnosis not present

## 2020-04-17 DIAGNOSIS — Z0000 Encounter for general adult medical examination without abnormal findings: Secondary | ICD-10-CM | POA: Diagnosis not present

## 2020-04-17 DIAGNOSIS — G5712 Meralgia paresthetica, left lower limb: Secondary | ICD-10-CM

## 2020-04-17 DIAGNOSIS — R600 Localized edema: Secondary | ICD-10-CM

## 2020-04-17 MED ORDER — FLUOXETINE HCL 20 MG PO CAPS: 20.0000 mg | ORAL_CAPSULE | Freq: Every day | ORAL | 3 refills | Status: DC

## 2020-04-18 ENCOUNTER — Encounter: Payer: Self-pay | Admitting: Family Medicine

## 2020-04-18 LAB — CBC
HCT: 33.2 % — ABNORMAL LOW (ref 35.0–45.0)
Hemoglobin: 10.9 g/dL — ABNORMAL LOW (ref 11.7–15.5)
MCH: 27.9 pg (ref 27.0–33.0)
MCHC: 32.8 g/dL (ref 32.0–36.0)
MCV: 84.9 fL (ref 80.0–100.0)
MPV: 11.4 fL (ref 7.5–12.5)
Platelets: 334 10*3/uL (ref 140–400)
RBC: 3.91 10*6/uL (ref 3.80–5.10)
RDW: 12.6 % (ref 11.0–15.0)
WBC: 5.7 10*3/uL (ref 3.8–10.8)

## 2020-04-18 LAB — HEMOGLOBIN A1C
Hgb A1c MFr Bld: 6.5 % of total Hgb — ABNORMAL HIGH (ref <5.7)
Mean Plasma Glucose: 140 (calc)
eAG (mmol/L): 7.7 (calc)

## 2020-04-18 LAB — COMPREHENSIVE METABOLIC PANEL
AG Ratio: 1 (calc) (ref 1.0–2.5)
ALT: 9 U/L (ref 6–29)
AST: 14 U/L (ref 10–35)
Albumin: 4 g/dL (ref 3.6–5.1)
Alkaline phosphatase (APISO): 95 U/L (ref 37–153)
BUN/Creatinine Ratio: 14 (calc) (ref 6–22)
BUN: 15 mg/dL (ref 7–25)
CO2: 26 mmol/L (ref 20–32)
Calcium: 9.7 mg/dL (ref 8.6–10.4)
Chloride: 102 mmol/L (ref 98–110)
Creat: 1.07 mg/dL — ABNORMAL HIGH (ref 0.50–0.99)
Globulin: 3.9 g/dL (calc) — ABNORMAL HIGH (ref 1.9–3.7)
Glucose, Bld: 100 mg/dL — ABNORMAL HIGH (ref 65–99)
Potassium: 3.9 mmol/L (ref 3.5–5.3)
Sodium: 140 mmol/L (ref 135–146)
Total Bilirubin: 0.8 mg/dL (ref 0.2–1.2)
Total Protein: 7.9 g/dL (ref 6.1–8.1)

## 2020-04-18 LAB — TEST AUTHORIZATION

## 2020-04-18 LAB — FERRITIN: Ferritin: 45 ng/mL (ref 16–288)

## 2020-04-27 ENCOUNTER — Other Ambulatory Visit: Payer: Self-pay | Admitting: Family Medicine

## 2020-05-06 ENCOUNTER — Other Ambulatory Visit: Payer: Self-pay | Admitting: Family Medicine

## 2020-05-06 DIAGNOSIS — E119 Type 2 diabetes mellitus without complications: Secondary | ICD-10-CM

## 2020-05-07 ENCOUNTER — Other Ambulatory Visit: Payer: Self-pay | Admitting: Family Medicine

## 2020-05-07 DIAGNOSIS — E119 Type 2 diabetes mellitus without complications: Secondary | ICD-10-CM

## 2020-05-09 ENCOUNTER — Other Ambulatory Visit: Payer: Self-pay | Admitting: Family Medicine

## 2020-05-09 DIAGNOSIS — F321 Major depressive disorder, single episode, moderate: Secondary | ICD-10-CM

## 2020-05-14 NOTE — Progress Notes (Signed)
[place] at [facility] [address], [place], [place] [address] [phone]  Date:  05/20/2020   Name:  Emily Wood   DOB:  11/26/1950   MRN:  [id number]  PCP:  Darreld Mclean, MD    Chief Complaint: Hypertension (1 month follow up)   History of Present Illness:  Emily Wood is a 69 y.o. very pleasant female patient who presents with the following:  Patient here today for short-term follow-up visit-history of diabetes, chronic kidney disease, hypertension, venous insufficiency and lymphedema, obesity, Graves' hyperthyroidism in remission Her thyroid disease is managed by endocrinology, she no longer sees nephrology Pt reports she was actually released from endocrinology care-we can do routine thyroid labs for her.  She is no longer taking any medication for her thyroid   Last seen by myself for physical about 1 month ago.  At that time she was struggling with depression: Her main problem today actually seems to be depression.  She expresses feelings of hopelessness, low energy, lack of social support.  After discussion she would like to start on fluoxetine.  Prescribed 20 mg, increase to 40 after 2 to 3 weeks as tolerated.  Went over most common side effects.  I also encouraged her to consider seeing a counselor, and to work on expanding her social circle Asked her to see me in 1 month for follow-up  She never tried the prozac- she did fill it, but decided not to take it.  She went to [place] to visit family.  At this time she is feeling better anyway.  She feels less depressed.  She is not sure why- maybe visiting her family helped her. She has not yet been able to expand her social circle but plans to work on this.   She may try   Recommend COVID-19 booster; she plans to do this today if possible  Flu UTD Can recommend pneumovax - she would like to do today   Overall today Emily Wood feels as though she is doing  well  Lab Results  Component Value Date   HGBA1C 6.5 (H) 04/17/2020      Patient Active Problem List   Diagnosis Date Noted  . Graves disease 04/14/2020  . Lymphedema 01/25/2017  . Fibroid uterus 01/14/2017  . Hypertensive heart disease without heart failure 10/01/2016  . Chronic kidney disease, stage 3 ([place]) 09/21/2016  . Meralgia paresthetica of left side 08/27/2015  . Diabetes mellitus type 2, controlled ([place]) 08/02/2015  . Chronic venous insufficiency 06/28/2015  . Essential hypertension 02/15/2015  . Peripheral edema 02/15/2015  . Chronic knee pain 02/15/2015    Past Medical History:  Diagnosis Date  . Arthritis   . Diabetes mellitus without complication ([place])   . Graves disease 04/14/2020  . Hypertension     Past Surgical History:  Procedure Laterality Date  . CHOLECYSTECTOMY    . HERNIA REPAIR    . OTHER SURGICAL HISTORY     scar tissue removal from bowels  . PARTIAL HYSTERECTOMY     Still has ovaries  . UTERINE FIBROID SURGERY      Social History   Tobacco Use  . Smoking status: Never Smoker  . Smokeless tobacco: Never Used  Vaping Use  . Vaping Use: Never used  Substance Use Topics  . Alcohol use: No    Alcohol/week: 0.0 standard drinks  . Drug use: No    Family History  Problem Relation Age of Onset  .  Hyperlipidemia Mother   . Hyperlipidemia Maternal Aunt   . Breast cancer Cousin     No Known Allergies  Medication list has been reviewed and updated.  Current Outpatient Medications on File Prior to Visit  Medication Sig Dispense Refill  . Alpha-Lipoic Acid 300 MG CAPS Take 2 capsules by mouth daily.    Marland Kitchen amLODipine (NORVASC) 5 MG tablet TAKE 1 AND 1/2 TABLETS BY  MOUTH DAILY 131 tablet 3  . aspirin 81 MG tablet Take 81 mg by mouth daily.    . cephALEXin (KEFLEX) 500 MG capsule Take 1 capsule (500 mg total) by mouth 2 (two) times daily. 14 capsule 0  . cloNIDine (CATAPRES) 0.2 MG tablet TAKE 1 TABLET BY MOUTH 3  TIMES DAILY 270 tablet 3   . fluticasone (FLONASE) 50 MCG/ACT nasal spray SPRAY 2 SPRAYS INTO EACH NOSTRIL EVERY DAY 48 g 3  . gabapentin (NEURONTIN) 400 MG capsule TAKE 2 CAPSULES BY MOUTH 3  TIMES DAILY 540 capsule 3  . glipiZIDE (GLUCOTROL) 5 MG tablet TAKE 1 TABLET BY MOUTH EVERY DAY BEFORE BREAKFAST 90 tablet 1  . hydrochlorothiazide (HYDRODIURIL) 25 MG tablet TAKE 1 TABLET BY MOUTH  DAILY 90 tablet 3  . irbesartan (AVAPRO) 300 MG tablet TAKE 1 TABLET BY MOUTH  DAILY 90 tablet 3  . metFORMIN (GLUCOPHAGE) 1000 MG tablet Take 1 tablet (1,000 mg total) by mouth 2 (two) times daily with a meal. 180 tablet 0  . metoprolol tartrate (LOPRESSOR) 50 MG tablet TAKE 1 TABLET BY MOUTH  TWICE DAILY 180 tablet 3  . nystatin cream (MYCOSTATIN) Apply 1 application topically 2 (two) times daily. 30 g 1  . potassium chloride SA (KLOR-CON) 20 MEQ tablet TAKE 1 TABLET BY MOUTH  DAILY 90 tablet 3  . UNABLE TO FIND Med Name: Green Lipped Muscle oil GLX3    . FLUoxetine (PROZAC) 20 MG capsule TAKE 1 CAPSULE (20 MG TOTAL) BY MOUTH DAILY. INCREASE TO 40 MG AFTER 2-3 WEEKS (Patient not taking: Reported on 05/20/2020) 180 capsule 2   No current facility-administered medications on file prior to visit.    Review of Systems:  As per HPI- otherwise negative.   Physical Examination: Vitals:   05/20/20 0912  BP: 126/62  Pulse: 60  Resp: 17  SpO2: 97%   Vitals:   05/20/20 0912  Weight: 242 lb (109.8 kg)  Height: 5\' 5"  (1.651 m)   Body mass index is 40.27 kg/m. Ideal Body Weight: Weight in (lb) to have BMI = 25: 149.9  GEN: no acute distress. Obese, looks well  HEENT: Atraumatic, Normocephalic.  Ears and Nose: No external deformity. CV: RRR, No M/G/R. No JVD. No thrill. No extra heart sounds. PULM: CTA B, no wheezes, crackles, rhonchi. No retractions. No resp. distress. No accessory muscle use. ABD: S, NT, ND, +BS. No rebound. No HSM. EXTR: No c/c.  Chronic LE edema is stable PSYCH: Normally interactive. Conversant.   Wt  Readings from Last 3 Encounters:  05/20/20 242 lb (109.8 kg)  04/17/20 241 lb (109.3 kg)  08/15/19 251 lb (113.9 kg)    Assessment and Plan: Essential hypertension - Plan: irbesartan (AVAPRO) 300 MG tablet  Immunization due - Plan: Pneumococcal polysaccharide vaccine 23-valent greater than or equal to 2yo subcutaneous/IM  Controlled type 2 diabetes mellitus without complication, without long-term current use of insulin ([place]) - Plan: metFORMIN (GLUCOPHAGE) 1000 MG tablet  Here today for a follow-up visit Depression symptoms have currently remitted.  She did not end up taking  fluoxetine.  I encouraged her to try this if her symptoms do seem to return Flu and pneumonia vaccines today Refill medications as above Plan to follow-up in 2 months to recheck A1c  This visit occurred during the SARS-CoV-2 public health emergency.  Safety protocols were in place, including screening questions prior to the visit, additional usage of staff PPE, and extensive cleaning of exam room while observing appropriate contact time as indicated for disinfecting solutions.    Signed [name], MD

## 2020-05-16 ENCOUNTER — Encounter: Payer: Self-pay | Admitting: Family Medicine

## 2020-05-20 ENCOUNTER — Encounter: Payer: Self-pay | Admitting: Family Medicine

## 2020-05-20 ENCOUNTER — Other Ambulatory Visit: Payer: Self-pay

## 2020-05-20 ENCOUNTER — Ambulatory Visit (INDEPENDENT_AMBULATORY_CARE_PROVIDER_SITE_OTHER): Payer: Medicare Other | Admitting: Family Medicine

## 2020-05-20 VITALS — BP 126/62 | HR 60 | Resp 17 | Ht 65.0 in | Wt 242.0 lb

## 2020-05-20 DIAGNOSIS — I10 Essential (primary) hypertension: Secondary | ICD-10-CM | POA: Diagnosis not present

## 2020-05-20 DIAGNOSIS — Z23 Encounter for immunization: Secondary | ICD-10-CM | POA: Diagnosis not present

## 2020-05-20 DIAGNOSIS — E119 Type 2 diabetes mellitus without complications: Secondary | ICD-10-CM

## 2020-05-20 MED ORDER — METFORMIN HCL 1000 MG PO TABS: 1000.0000 mg | ORAL_TABLET | Freq: Two times a day (BID) | ORAL | 3 refills | Status: DC

## 2020-05-20 MED ORDER — IRBESARTAN 300 MG PO TABS: 300.0000 mg | ORAL_TABLET | Freq: Every day | ORAL | 3 refills | Status: DC

## 2020-05-20 NOTE — Patient Instructions (Addendum)
It was good to see you again today!  Please see me in about 2 months to follow-up on diabetes and thyroid  You got your pneumonia booster today You can also get your covid 19 booster today at the pharmacy downstairs if you like!

## 2020-05-21 ENCOUNTER — Telehealth: Payer: Self-pay | Admitting: Family Medicine

## 2020-05-21 DIAGNOSIS — E119 Type 2 diabetes mellitus without complications: Secondary | ICD-10-CM

## 2020-05-21 DIAGNOSIS — I10 Essential (primary) hypertension: Secondary | ICD-10-CM

## 2020-05-21 MED ORDER — METFORMIN HCL 1000 MG PO TABS: 1000.0000 mg | ORAL_TABLET | Freq: Two times a day (BID) | ORAL | 3 refills | Status: DC

## 2020-05-21 MED ORDER — IRBESARTAN 300 MG PO TABS: 300.0000 mg | ORAL_TABLET | Freq: Every day | ORAL | 3 refills | Status: DC

## 2020-05-21 NOTE — Telephone Encounter (Signed)
Medications sent to pharmacy for patient.

## 2020-05-21 NOTE — Telephone Encounter (Signed)
Patient would like the following rx to be sent to  [facility], [place], [address] Phone:  [phone]  Fax:  [phone]      metFORMIN (GLUCOPHAGE) 1000 MG tablet [[id number]]   irbesartan (AVAPRO) 300 MG tablet [[id number]]

## 2020-05-29 DIAGNOSIS — N2889 Other specified disorders of kidney and ureter: Secondary | ICD-10-CM | POA: Diagnosis not present

## 2020-05-29 DIAGNOSIS — N281 Cyst of kidney, acquired: Secondary | ICD-10-CM | POA: Diagnosis not present

## 2020-05-29 DIAGNOSIS — K7689 Other specified diseases of liver: Secondary | ICD-10-CM | POA: Diagnosis not present

## 2020-05-29 DIAGNOSIS — M5136 Other intervertebral disc degeneration, lumbar region: Secondary | ICD-10-CM | POA: Diagnosis not present

## 2020-05-31 DIAGNOSIS — N3946 Mixed incontinence: Secondary | ICD-10-CM | POA: Insufficient documentation

## 2020-05-31 DIAGNOSIS — N2889 Other specified disorders of kidney and ureter: Secondary | ICD-10-CM | POA: Diagnosis not present

## 2020-05-31 DIAGNOSIS — R6889 Other general symptoms and signs: Secondary | ICD-10-CM | POA: Diagnosis not present

## 2020-05-31 DIAGNOSIS — R3915 Urgency of urination: Secondary | ICD-10-CM | POA: Insufficient documentation

## 2020-06-07 ENCOUNTER — Ambulatory Visit: Payer: Medicare Other | Attending: Internal Medicine

## 2020-06-07 ENCOUNTER — Other Ambulatory Visit (HOSPITAL_BASED_OUTPATIENT_CLINIC_OR_DEPARTMENT_OTHER): Payer: Self-pay | Admitting: Internal Medicine

## 2020-06-07 DIAGNOSIS — Z23 Encounter for immunization: Secondary | ICD-10-CM

## 2020-06-07 NOTE — Progress Notes (Signed)
   Covid-19 Vaccination Clinic  Name:  Emily Wood    MRN: [id number] DOB: 07-03-1951  06/07/2020  [name] was observed post Covid-19 immunization for 15 minutes without incident. She was provided with Vaccine Information Sheet and instruction to access the V-Safe system.   [name] was instructed to call 911 with any severe reactions post vaccine: Marland Kitchen Difficulty breathing  . Swelling of face and throat  . A fast heartbeat  . A bad rash all over body  . Dizziness and weakness   Immunizations Administered    Name Date Dose VIS Date Route   Pfizer COVID-19 Vaccine 06/07/2020 10:28 AM 0.3 mL 05/08/2020 Intramuscular   Manufacturer: [place]   Lot: [id number]   [place]: [id number]

## 2020-06-20 ENCOUNTER — Other Ambulatory Visit: Payer: Self-pay | Admitting: Family Medicine

## 2020-06-27 ENCOUNTER — Telehealth: Payer: Self-pay | Admitting: Family Medicine

## 2020-06-27 NOTE — Telephone Encounter (Signed)
Called pt and she stated she doesn't need refills currently but she does need all future refills sent to [facility]

## 2020-06-27 NOTE — Telephone Encounter (Signed)
Patient called in reference to medication refills, patient states she received a call about a medication refill from [facility]. Patient states she have changed to mail order and would all medications to go through there.

## 2020-07-04 ENCOUNTER — Encounter: Payer: Self-pay | Admitting: Family Medicine

## 2020-07-04 ENCOUNTER — Other Ambulatory Visit: Payer: Self-pay

## 2020-07-04 ENCOUNTER — Ambulatory Visit (INDEPENDENT_AMBULATORY_CARE_PROVIDER_SITE_OTHER): Payer: Medicare Other | Admitting: Family Medicine

## 2020-07-04 ENCOUNTER — Ambulatory Visit (HOSPITAL_BASED_OUTPATIENT_CLINIC_OR_DEPARTMENT_OTHER)
Admission: RE | Admit: 2020-07-04 | Discharge: 2020-07-04 | Disposition: A | Discharge status: Home / Self Care | Payer: Medicare Other | Source: Ambulatory Visit | Attending: Family Medicine | Admitting: Family Medicine

## 2020-07-04 VITALS — BP 160/90 | HR 72 | Temp 98.2°F | Resp 18 | Ht 65.0 in

## 2020-07-04 DIAGNOSIS — M79645 Pain in left finger(s): Secondary | ICD-10-CM

## 2020-07-04 DIAGNOSIS — M79642 Pain in left hand: Secondary | ICD-10-CM | POA: Diagnosis not present

## 2020-07-04 LAB — CBC WITH DIFFERENTIAL/PLATELET
Basophils Absolute: 0.1 10*3/uL (ref 0.0–0.1)
Basophils Relative: 0.9 % (ref 0.0–3.0)
Eosinophils Absolute: 0.2 10*3/uL (ref 0.0–0.7)
Eosinophils Relative: 3.3 % (ref 0.0–5.0)
HCT: 31.6 % — ABNORMAL LOW (ref 36.0–46.0)
Hemoglobin: 10.3 g/dL — ABNORMAL LOW (ref 12.0–15.0)
Lymphocytes Relative: 20.4 % (ref 12.0–46.0)
Lymphs Abs: 1.3 10*3/uL (ref 0.7–4.0)
MCHC: 32.6 g/dL (ref 30.0–36.0)
MCV: 82.7 fl (ref 78.0–100.0)
Monocytes Absolute: 0.5 10*3/uL (ref 0.1–1.0)
Monocytes Relative: 7.5 % (ref 3.0–12.0)
Neutro Abs: 4.5 10*3/uL (ref 1.4–7.7)
Neutrophils Relative %: 67.9 % (ref 43.0–77.0)
Platelets: 426 10*3/uL — ABNORMAL HIGH (ref 150.0–400.0)
RBC: 3.83 Mil/uL — ABNORMAL LOW (ref 3.87–5.11)
RDW: 13.8 % (ref 11.5–15.5)
WBC: 6.6 10*3/uL (ref 4.0–10.5)

## 2020-07-04 LAB — COMPREHENSIVE METABOLIC PANEL
ALT: 11 U/L (ref 0–35)
AST: 15 U/L (ref 0–37)
Albumin: 4.2 g/dL (ref 3.5–5.2)
Alkaline Phosphatase: 95 U/L (ref 39–117)
BUN: 16 mg/dL (ref 6–23)
CO2: 27 mEq/L (ref 19–32)
Calcium: 9.9 mg/dL (ref 8.4–10.5)
Chloride: 103 mEq/L (ref 96–112)
Creatinine, Ser: 0.93 mg/dL (ref 0.40–1.20)
GFR: 63.17 mL/min (ref >60.00)
Glucose, Bld: 90 mg/dL (ref 70–99)
Potassium: 3.7 mEq/L (ref 3.5–5.1)
Sodium: 139 mEq/L (ref 135–145)
Total Bilirubin: 0.6 mg/dL (ref 0.2–1.2)
Total Protein: 8.8 g/dL — ABNORMAL HIGH (ref 6.0–8.3)

## 2020-07-04 LAB — URIC ACID: Uric Acid, Serum: 6.6 mg/dL (ref 2.4–7.0)

## 2020-07-04 MED ORDER — TRAMADOL HCL 50 MG PO TABS: 50.0000 mg | ORAL_TABLET | Freq: Three times a day (TID) | ORAL | 0 refills | Status: AC | PRN

## 2020-07-04 MED ORDER — DOXYCYCLINE HYCLATE 100 MG PO TABS: 100.0000 mg | ORAL_TABLET | Freq: Two times a day (BID) | ORAL | 0 refills | Status: DC

## 2020-07-04 NOTE — Patient Instructions (Signed)
Go downstairs for xray after you have your labs done [facility] the antibiotics

## 2020-07-04 NOTE — Progress Notes (Signed)
Patient ID: Emily Wood, female    DOB: 1951-06-03  Age: 69 y.o. MRN: [id number]    Subjective:  Subjective  HPI TAYEN NARANG presents for pain and swelling in L ring finger distally.  Pain started Tuesday.   Tylenol doesn't help   Review of Systems  Constitutional: Negative for appetite change, diaphoresis, fatigue and unexpected weight change.  Eyes: Negative for pain, redness and visual disturbance.  Respiratory: Negative for cough, chest tightness, shortness of breath and wheezing.   Cardiovascular: Negative for chest pain, palpitations and leg swelling.  Endocrine: Negative for cold intolerance, heat intolerance, polydipsia, polyphagia and polyuria.  Genitourinary: Negative for difficulty urinating, dysuria and frequency.  Musculoskeletal: Positive for joint swelling.  Neurological: Negative for dizziness, light-headedness, numbness and headaches.    History Past Medical History:  Diagnosis Date  . Arthritis   . Diabetes mellitus without complication ([place])   . Graves disease 04/14/2020  . Hypertension     She has a past surgical history that includes Partial hysterectomy; Cholecystectomy; Uterine fibroid surgery; Hernia repair; and Other surgical history.   Her family history includes Breast cancer in her cousin; Hyperlipidemia in her maternal aunt and mother.She reports that she has never smoked. She has never used smokeless tobacco. She reports that she does not drink alcohol and does not use drugs.  Current Outpatient Medications on File Prior to Visit  Medication Sig Dispense Refill  . Alpha-Lipoic Acid 300 MG CAPS Take 2 capsules by mouth daily.    Marland Kitchen amLODipine (NORVASC) 5 MG tablet TAKE 1 AND 1/2 TABLETS BY  MOUTH DAILY 131 tablet 3  . aspirin 81 MG tablet Take 81 mg by mouth daily.    . cloNIDine (CATAPRES) 0.2 MG tablet TAKE 1 TABLET BY MOUTH 3  TIMES DAILY 270 tablet 3  . FLUoxetine (PROZAC) 20 MG capsule TAKE 1 CAPSULE (20 MG TOTAL) BY MOUTH DAILY.  INCREASE TO 40 MG AFTER 2-3 WEEKS 180 capsule 2  . fluticasone (FLONASE) 50 MCG/ACT nasal spray SPRAY 2 SPRAYS INTO EACH NOSTRIL EVERY DAY 48 g 3  . gabapentin (NEURONTIN) 400 MG capsule TAKE 2 CAPSULES BY MOUTH 3  TIMES DAILY 540 capsule 3  . glipiZIDE (GLUCOTROL) 5 MG tablet TAKE 1 TABLET BY MOUTH EVERY DAY BEFORE BREAKFAST 90 tablet 1  . hydrochlorothiazide (HYDRODIURIL) 25 MG tablet TAKE 1 TABLET BY MOUTH  DAILY 90 tablet 3  . irbesartan (AVAPRO) 300 MG tablet Take 1 tablet (300 mg total) by mouth daily. 90 tablet 3  . metFORMIN (GLUCOPHAGE) 1000 MG tablet Take 1 tablet (1,000 mg total) by mouth 2 (two) times daily with a meal. 180 tablet 3  . metoprolol tartrate (LOPRESSOR) 50 MG tablet TAKE 1 TABLET BY MOUTH  TWICE DAILY 180 tablet 3  . nystatin cream (MYCOSTATIN) Apply 1 application topically 2 (two) times daily. 30 g 1  . potassium chloride SA (KLOR-CON) 20 MEQ tablet TAKE 1 TABLET BY MOUTH  DAILY 90 tablet 3  . UNABLE TO FIND Med Name: Nyoka Cowden Lipped Muscle oil GLX3     No current facility-administered medications on file prior to visit.     Objective:  Objective  Physical Exam Vitals and nursing note reviewed.  Constitutional:      Appearance: She is well-developed and well-nourished.  HENT:     Head: Normocephalic and atraumatic.  Eyes:     Extraocular Movements: EOM normal.     Conjunctiva/sclera: Conjunctivae normal.  Neck:     Thyroid: No thyromegaly.  Vascular: No carotid bruit or JVD.  Cardiovascular:     Rate and Rhythm: Normal rate and regular rhythm.     Heart sounds: Normal heart sounds. No murmur heard.   Pulmonary:     Effort: Pulmonary effort is normal. No respiratory distress.     Breath sounds: Normal breath sounds. No wheezing or rales.  Chest:     Chest wall: No tenderness.  Musculoskeletal:        General: Swelling and tenderness present. No edema.     Left hand: Swelling, tenderness and bony tenderness present. Decreased range of motion.        Arms:     Cervical back: Normal range of motion and neck supple.  Neurological:     Mental Status: She is alert and oriented to person, place, and time.  Psychiatric:        Mood and Affect: Mood and affect normal.    BP (!) 160/90 (BP Location: Left Arm, Patient Position: Sitting, Cuff Size: Large)   Pulse 72   Temp 98.2 F (36.8 C) (Oral)   Resp 18   Ht 5\' 5"  (1.651 m)   SpO2 97%   BMI 40.27 kg/m  Wt Readings from Last 3 Encounters:  05/20/20 242 lb (109.8 kg)  04/17/20 241 lb (109.3 kg)  08/15/19 251 lb (113.9 kg)     Lab Results  Component Value Date   WBC 5.7 04/17/2020   HGB 10.9 (L) 04/17/2020   HCT 33.2 (L) 04/17/2020   PLT 334 04/17/2020   GLUCOSE 100 (H) 04/17/2020   CHOL 182 07/06/2019   TRIG 129.0 07/06/2019   HDL 47.30 07/06/2019   LDLCALC 109 (H) 07/06/2019   ALT 9 04/17/2020   AST 14 04/17/2020   NA 140 04/17/2020   K 3.9 04/17/2020   CL 102 04/17/2020   CREATININE 1.07 (H) 04/17/2020   BUN 15 04/17/2020   CO2 26 04/17/2020   TSH 0.05 (A) 10/15/2017   HGBA1C 6.5 (H) 04/17/2020   MICROALBUR 203.2 (H) 11/08/2019    US Venous Img Lower Unilateral Right (DVT)  Result Date: 01/05/2020 CLINICAL DATA:  Right lower extremity pain and edema. History of previous left lower extremity DVT. Evaluate for acute or chronic DVT. EXAM: RIGHT LOWER EXTREMITY VENOUS DOPPLER ULTRASOUND TECHNIQUE: Gray-scale sonography with graded compression, as well as color Doppler and duplex ultrasound were performed to evaluate the lower extremity deep venous systems from the level of the common femoral vein and including the common femoral, femoral, profunda femoral, popliteal and calf veins including the posterior tibial, peroneal and gastrocnemius veins when visible. The superficial great saphenous vein was also interrogated. Spectral Doppler was utilized to evaluate flow at rest and with distal augmentation maneuvers in the common femoral, femoral and popliteal veins. COMPARISON:   None. FINDINGS: Contralateral Common Femoral Vein: Respiratory phasicity is normal and symmetric with the symptomatic side. No evidence of thrombus. Normal compressibility. Common Femoral Vein: No evidence of thrombus. Normal compressibility, respiratory phasicity and response to augmentation. Saphenofemoral Junction: No evidence of thrombus. Normal compressibility and flow on color Doppler imaging. Profunda Femoral Vein: No evidence of thrombus. Normal compressibility and flow on color Doppler imaging. Femoral Vein: No evidence of thrombus. Normal compressibility, respiratory phasicity and response to augmentation. Popliteal Vein: No evidence of thrombus. Normal compressibility, respiratory phasicity and response to augmentation. Calf Veins: No evidence of thrombus. Normal compressibility and flow on color Doppler imaging. Superficial Great Saphenous Vein: No evidence of thrombus. Normal compressibility. Venous Reflux:  None.  Other Findings: There is no sonographic correlate for patient's area of discomfort involving the posterolateral aspect of the right calf (image 38). IMPRESSION: 1. No evidence of acute or chronic DVT within the right lower extremity. 2. No sonographic correlate patient's area of discomfort involving the posterolateral aspect the right calf. Electronically Signed   By: Sandi Mariscal M.D.   On: 01/05/2020 10:06     Assessment & Plan:  Plan  I have discontinued Helma Argyle. Dornbush's cephALEXin. I am also having her maintain her aspirin, Alpha-Lipoic Acid, fluticasone, nystatin cream, gabapentin, UNABLE TO FIND, hydrochlorothiazide, metoprolol tartrate, cloNIDine, potassium chloride SA, amLODipine, glipiZIDE, FLUoxetine, metFORMIN, and irbesartan.  No orders of the defined types were placed in this encounter.   Problem List Items Addressed This Visit   None     Follow-up: No follow-ups on file.  Ann Held, DO

## 2020-07-05 ENCOUNTER — Telehealth: Payer: Self-pay | Admitting: Family Medicine

## 2020-07-05 NOTE — Telephone Encounter (Signed)
Xray is not necessarily going to see infection unless it affects the bone Doxy is for the swelling and pain that was in her finger when she was here and it was warm to touch

## 2020-07-05 NOTE — Telephone Encounter (Signed)
Patient states she would like to discuss her xray with the someone. Patient also got question in regards to rx that was prescribe to her.

## 2020-07-05 NOTE — Telephone Encounter (Signed)
Patient notified and verbalized understanding. 

## 2020-07-05 NOTE — Telephone Encounter (Signed)
Discussed results with patient. She is questioning if she needs to take the doxycycline? She does not have or show any signs of infection.

## 2020-07-20 NOTE — Progress Notes (Addendum)
[place] at [facility] [address], [place], [place] [address] [phone]  Date:  07/24/2020   Name:  Emily Wood   DOB:  [birth year]   MRN:  [id number]  PCP:  Darreld Mclean, MD    Chief Complaint: Hypertension and Hip Pain (Hip pain, left side, lower abdomen left side, off and on pain/)   History of Present Illness:  Emily Wood is a 70 y.o. very pleasant female patient who presents with the following:  Here today for a follow-up visit- Patient with history of diabetes, chronic kidney disease, hypertension, venous insufficiency and lymphedema, obesity, Graves' hyperthyroidism in remission Last seen by myself in November  She no longer sees endocrinology as her Graves' disease has been in remission.  She could use a TSH level  covid booster done  She was seen by Dr Etter Sjogren in mid- December with a finger issue; pain and swelling in left ring finger This is now resolved and feeling fine   Amlodipine Aspirin 81 Clonidine 0.2 3 times daily Fluoxetine Gabapentin Glipizide Metformin HCTZ Ibesartan Lopressor Potassium supplement  Eye exam- scheduled to do 2/8 Foot exam-we will do today Mammogram- this is scheduled   Pt notes she is having some left hip pain today- she has had noted this in the past It sometimes feels like a cramp type pain  She will use tylenol which does help The pain may go down the left leg as well  It may hurt to walk sometimes  No known injury  She just took her BP meds on the way here  She has chronic bilateral lower extremity edema/lymphedema, she is seeing vascular next week for consultation  Lab Results  Component Value Date   HGBA1C 6.5 (H) 04/17/2020    Patient Active Problem List   Diagnosis Date Noted  . Graves disease 04/14/2020  . Lymphedema 01/25/2017  . Fibroid uterus 01/14/2017  . Hypertensive heart disease without heart failure 10/01/2016  . Chronic kidney  disease, stage 3 ([place]) 09/21/2016  . Meralgia paresthetica of left side 08/27/2015  . Diabetes mellitus type 2, controlled ([place]) 08/02/2015  . Chronic venous insufficiency 06/28/2015  . Essential hypertension 02/15/2015  . Peripheral edema 02/15/2015  . Chronic knee pain 02/15/2015    Past Medical History:  Diagnosis Date  . Arthritis   . Diabetes mellitus without complication ([place])   . Graves disease 04/14/2020  . Hypertension     Past Surgical History:  Procedure Laterality Date  . CHOLECYSTECTOMY    . HERNIA REPAIR    . OTHER SURGICAL HISTORY     scar tissue removal from bowels  . PARTIAL HYSTERECTOMY     Still has ovaries  . UTERINE FIBROID SURGERY      Social History   Tobacco Use  . Smoking status: Never Smoker  . Smokeless tobacco: Never Used  Vaping Use  . Vaping Use: Never used  Substance Use Topics  . Alcohol use: No    Alcohol/week: 0.0 standard drinks  . Drug use: No    Family History  Problem Relation Age of Onset  . Hyperlipidemia Mother   . Hyperlipidemia Maternal Aunt   . Breast cancer Cousin     No Known Allergies  Medication list has been reviewed and updated.  Current Outpatient Medications on File Prior to Visit  Medication Sig Dispense Refill  . Alpha-Lipoic Acid 300 MG CAPS Take 2 capsules by mouth daily.    Marland Kitchen  amLODipine (NORVASC) 5 MG tablet TAKE 1 AND 1/2 TABLETS BY  MOUTH DAILY 131 tablet 3  . aspirin 81 MG tablet Take 81 mg by mouth daily.    . cloNIDine (CATAPRES) 0.2 MG tablet TAKE 1 TABLET BY MOUTH 3  TIMES DAILY 270 tablet 3  . fluticasone (FLONASE) 50 MCG/ACT nasal spray SPRAY 2 SPRAYS INTO EACH NOSTRIL EVERY DAY 48 g 3  . gabapentin (NEURONTIN) 400 MG capsule TAKE 2 CAPSULES BY MOUTH 3  TIMES DAILY 540 capsule 3  . glipiZIDE (GLUCOTROL) 5 MG tablet TAKE 1 TABLET BY MOUTH EVERY DAY BEFORE BREAKFAST 90 tablet 1  . hydrochlorothiazide (HYDRODIURIL) 25 MG tablet TAKE 1 TABLET BY MOUTH  DAILY 90 tablet 3  . irbesartan (AVAPRO)  300 MG tablet Take 1 tablet (300 mg total) by mouth daily. 90 tablet 3  . metFORMIN (GLUCOPHAGE) 1000 MG tablet Take 1 tablet (1,000 mg total) by mouth 2 (two) times daily with a meal. 180 tablet 3  . metoprolol tartrate (LOPRESSOR) 50 MG tablet TAKE 1 TABLET BY MOUTH  TWICE DAILY 180 tablet 3  . nystatin cream (MYCOSTATIN) Apply 1 application topically 2 (two) times daily. 30 g 1  . potassium chloride SA (KLOR-CON) 20 MEQ tablet TAKE 1 TABLET BY MOUTH  DAILY 90 tablet 3  . UNABLE TO FIND Med Name: Nyoka Cowden Lipped Muscle oil GLX3     No current facility-administered medications on file prior to visit.    Review of Systems:  As per HPI- otherwise negative.   Physical Examination: Vitals:   07/24/20 0919  BP: (!) 146/80  Pulse: 62  Resp: 18  SpO2: 99%   Vitals:   07/24/20 0919  Weight: 238 lb (108 kg)  Height: 5\' 5"  (1.651 m)   Body mass index is 39.61 kg/m. Ideal Body Weight: Weight in (lb) to have BMI = 25: 149.9  GEN: no acute distress.  Obese, looks well  HEENT: Atraumatic, Normocephalic.   PEERL Ears and Nose: No external deformity. CV: RRR, No M/G/R. No JVD. No thrill. No extra heart sounds. PULM: CTA B, no wheezes, crackles, rhonchi. No retractions. No resp. distress. No accessory muscle use. ABD: S, NT, ND, +BS. No rebound. No HSM. EXTR: No c/c/e PSYCH: Normally interactive. Conversant.  Pt continues to have significant edema of the bilateral LE  Pulses and sensation normal bilaterally  Patient has some discomfort with pressure over the left hip joint, and also some pain with stress of the left side hip flexors Belly is benign      Assessment and Plan: Controlled type 2 diabetes mellitus without complication, without long-term current use of insulin ([place]) - Plan: Hemoglobin A1c, glipiZIDE (GLUCOTROL) 5 MG tablet  Essential hypertension - Plan: Basic metabolic panel  Graves disease - Plan: TSH  Peripheral edema  Left hip pain - Plan: DG HIP UNILAT W OR W/O  PELVIS 2-3 VIEWS LEFT  Estrogen deficiency - Plan: DG Bone Density   Patient is here today with a couple of concerns.  We will check on her A1c today, I will be in touch this result.  She had wondered about getting a continuous blood glucose monitor.  I advised her unless her A1c is significantly higher this is not likely to be necessary  Blood pressure generally under good control-she just took medication on the way to clinic today Peripheral edema is stable We will obtain films of her left hip TSH, bone density ordered  This visit occurred during the SARS-CoV-2 public health emergency.  Safety protocols were in place, including screening questions prior to the visit, additional usage of staff PPE, and extensive cleaning of exam room while observing appropriate contact time as indicated for disinfecting solutions.    Signed Abbe Amsterdam, MD  Received her labs and x-ray report as below, message to patient  Results for orders placed or performed in visit on 07/24/20  Basic metabolic panel  Result Value Ref Range   Sodium 139 135 - 145 mEq/L   Potassium 4.0 3.5 - 5.1 mEq/L   Chloride 102 96 - 112 mEq/L   CO2 30 19 - 32 mEq/L   Glucose, Bld 84 70 - 99 mg/dL   BUN 18 6 - 23 mg/dL   Creatinine, Ser 0.53 0.40 - 1.20 mg/dL   GFR 97.67 (L) >34.19 mL/min   Calcium 9.6 8.4 - 10.5 mg/dL  Hemoglobin F7T  Result Value Ref Range   Hgb A1c MFr Bld 6.2 4.6 - 6.5 %  TSH  Result Value Ref Range   TSH <0.01 Repeated and verified X2. (L) 0.35 - 4.50 uIU/mL    DG HIP UNILAT W OR W/O PELVIS 2-3 VIEWS LEFT  Result Date: 07/24/2020 CLINICAL DATA:  Left hip pain.  No known injury. EXAM: DG HIP (WITH OR WITHOUT PELVIS) 2-3V LEFT COMPARISON:  MRI abdomen 11/18/2019. MRI left hip 09/16/2016. Left hip series 09/14/2016. FINDINGS: Degenerative changes lumbar spine, both SI joints, and both hips. No acute bony or joint abnormality. No evidence of fracture dislocation. Surgical clips noted the pelvis.  Pelvic calcifications consistent phleboliths. IMPRESSION: Degenerative changes lumbar spine, both SI joints, both hips. No acute bony or joint abnormality. Electronically Signed   By: Maisie Fus  Register   On: 07/24/2020 10:34

## 2020-07-23 NOTE — Patient Instructions (Addendum)
It was good to see you again today, I will be in touch with your labs as soon as possible Please go to lab and then to the ground floor to have an x-ray of your left hip I suspect you have arthritis of your hip- please stop by the ground floor imaging dept  They can also do your bone density today perhaps if you like!   Assuming your A1c is ok I would not recommend that we start you on a continuous blood glucose monitor because you don't need it and it is likely to increase anxiety!

## 2020-07-24 ENCOUNTER — Ambulatory Visit (INDEPENDENT_AMBULATORY_CARE_PROVIDER_SITE_OTHER): Payer: Medicare Other | Admitting: Family Medicine

## 2020-07-24 ENCOUNTER — Other Ambulatory Visit: Payer: Self-pay

## 2020-07-24 ENCOUNTER — Ambulatory Visit (HOSPITAL_BASED_OUTPATIENT_CLINIC_OR_DEPARTMENT_OTHER)
Admission: RE | Admit: 2020-07-24 | Discharge: 2020-07-24 | Disposition: A | Discharge status: Home / Self Care | Payer: Medicare Other | Source: Ambulatory Visit | Attending: Family Medicine | Admitting: Family Medicine

## 2020-07-24 ENCOUNTER — Encounter: Payer: Self-pay | Admitting: Family Medicine

## 2020-07-24 VITALS — BP 146/80 | HR 62 | Resp 18 | Ht 65.0 in | Wt 238.0 lb

## 2020-07-24 DIAGNOSIS — E119 Type 2 diabetes mellitus without complications: Secondary | ICD-10-CM | POA: Diagnosis not present

## 2020-07-24 DIAGNOSIS — I10 Essential (primary) hypertension: Secondary | ICD-10-CM

## 2020-07-24 DIAGNOSIS — M25552 Pain in left hip: Secondary | ICD-10-CM

## 2020-07-24 DIAGNOSIS — E0500 Thyrotoxicosis with diffuse goiter without thyrotoxic crisis or storm: Secondary | ICD-10-CM

## 2020-07-24 DIAGNOSIS — R609 Edema, unspecified: Secondary | ICD-10-CM

## 2020-07-24 DIAGNOSIS — E2839 Other primary ovarian failure: Secondary | ICD-10-CM

## 2020-07-24 LAB — BASIC METABOLIC PANEL
BUN: 18 mg/dL (ref 6–23)
CO2: 30 mEq/L (ref 19–32)
Calcium: 9.6 mg/dL (ref 8.4–10.5)
Chloride: 102 mEq/L (ref 96–112)
Creatinine, Ser: 1.06 mg/dL (ref 0.40–1.20)
GFR: 53.97 mL/min — ABNORMAL LOW (ref >60.00)
Glucose, Bld: 84 mg/dL (ref 70–99)
Potassium: 4 mEq/L (ref 3.5–5.1)
Sodium: 139 mEq/L (ref 135–145)

## 2020-07-24 LAB — TSH: TSH: <0.01 u[IU]/mL — ABNORMAL LOW (ref 0.35–4.50)

## 2020-07-24 LAB — HEMOGLOBIN A1C: Hgb A1c MFr Bld: 6.2 % (ref 4.6–6.5)

## 2020-07-24 MED ORDER — GLIPIZIDE 5 MG PO TABS: ORAL_TABLET | ORAL | 3 refills | Status: DC

## 2020-07-30 ENCOUNTER — Ambulatory Visit
Admission: RE | Admit: 2020-07-30 | Discharge: 2020-07-30 | Disposition: A | Discharge status: Home / Self Care | Payer: Medicare Other | Source: Ambulatory Visit | Attending: Obstetrics and Gynecology | Admitting: Obstetrics and Gynecology

## 2020-07-30 ENCOUNTER — Other Ambulatory Visit: Payer: Self-pay

## 2020-07-30 DIAGNOSIS — Z1231 Encounter for screening mammogram for malignant neoplasm of breast: Secondary | ICD-10-CM | POA: Diagnosis not present

## 2020-07-30 DIAGNOSIS — Z1239 Encounter for other screening for malignant neoplasm of breast: Secondary | ICD-10-CM

## 2020-08-02 DIAGNOSIS — E0500 Thyrotoxicosis with diffuse goiter without thyrotoxic crisis or storm: Secondary | ICD-10-CM | POA: Diagnosis not present

## 2020-08-05 ENCOUNTER — Ambulatory Visit (INDEPENDENT_AMBULATORY_CARE_PROVIDER_SITE_OTHER): Payer: Medicare Other | Admitting: Vascular Surgery

## 2020-08-15 ENCOUNTER — Other Ambulatory Visit: Payer: Self-pay | Admitting: Family Medicine

## 2020-08-15 DIAGNOSIS — E2839 Other primary ovarian failure: Secondary | ICD-10-CM

## 2020-08-22 ENCOUNTER — Ambulatory Visit (INDEPENDENT_AMBULATORY_CARE_PROVIDER_SITE_OTHER): Payer: Medicare Other | Admitting: Vascular Surgery

## 2020-08-26 ENCOUNTER — Encounter (INDEPENDENT_AMBULATORY_CARE_PROVIDER_SITE_OTHER): Payer: Self-pay | Admitting: Nurse Practitioner

## 2020-08-26 ENCOUNTER — Ambulatory Visit (INDEPENDENT_AMBULATORY_CARE_PROVIDER_SITE_OTHER): Payer: Medicare Other | Admitting: Nurse Practitioner

## 2020-08-26 ENCOUNTER — Other Ambulatory Visit: Payer: Self-pay

## 2020-08-26 VITALS — BP 175/74 | HR 74 | Ht 65.0 in | Wt 231.0 lb

## 2020-08-26 DIAGNOSIS — I890 Lymphedema, not elsewhere classified: Secondary | ICD-10-CM | POA: Diagnosis not present

## 2020-08-26 DIAGNOSIS — I10 Essential (primary) hypertension: Secondary | ICD-10-CM

## 2020-08-26 NOTE — Progress Notes (Signed)
Subjective:    Patient ID: Emily Wood, female    DOB: September 29, 1950, 70 y.o.   MRN: [id number] Chief Complaint  Patient presents with  . Follow-up    1 yr no studies     The patient returns to the office for followup evaluation regarding leg swelling.  The patient has a lymph pump however she notes that she does not use it on a consistent basis.  She uses it sporadically when her swelling is at its worst.  The patient is very diligent about wearing medical grade 1 compression stockings.  She utilizes these daily.  However, the patient is at home most days as she notes that she does not elevate her lower extremities.  She notes that she is in a dependent position in the majority of the day and her swelling does increase as the evening progresses.  Sometimes on the worst days the patient has a throbbing aching sensation.  Denies any open wounds or ulcerations.        Review of Systems  Cardiovascular: Positive for leg swelling.  All other systems reviewed and are negative.      Objective:   Physical Exam Vitals reviewed.  HENT:     Head: Normocephalic.  Cardiovascular:     Rate and Rhythm: Normal rate.     Pulses: Normal pulses.  Pulmonary:     Effort: Pulmonary effort is normal.  Musculoskeletal:     Right lower leg: Edema present.     Left lower leg: Edema present.  Skin:    Comments: lichenification bilaterally   Neurological:     Mental Status: She is alert and oriented to person, place, and time.  Psychiatric:        Mood and Affect: Mood normal.        Behavior: Behavior normal.        Thought Content: Thought content normal.        Judgment: Judgment normal.     BP (!) 175/74   Pulse 74   Ht 5\' 5"  (1.651 m)   Wt 231 lb (104.8 kg)   BMI 38.44 kg/m   Past Medical History:  Diagnosis Date  . Arthritis   . Diabetes mellitus without complication ([place])   . Graves disease 04/14/2020  . Graves disease 07/20/2020  . Graves disease   . Hypertension      Social History   Socioeconomic History  . Marital status: Married    Spouse name: [name]  . Number of children: 0  . Years of education: 77  . Highest education level: Not on file  Occupational History  . Occupation: customer service  Tobacco Use  . Smoking status: Never Smoker  . Smokeless tobacco: Never Used  Vaping Use  . Vaping Use: Never used  Substance and Sexual Activity  . Alcohol use: No    Alcohol/week: 0.0 standard drinks  . Drug use: No  . Sexual activity: Not Currently    Birth control/protection: Post-menopausal  Other Topics Concern  . Not on file  Social History Narrative   Lives with husband   Caffeine use: 16oz coffee per day   Social Determinants of Health   Financial Resource Strain: Low Risk   . Difficulty of Paying Living Expenses: Not hard at all  Food Insecurity: No Food Insecurity  . Worried About Charity fundraiser in the Last Year: Never true  . Ran Out of Food in the Last Year: Never true  Transportation Needs: No Transportation Needs  .  Lack of Transportation (Medical): No  . Lack of Transportation (Non-Medical): No  Physical Activity: Not on file  Stress: Not on file  Social Connections: Not on file  Intimate Partner Violence: Not on file    Past Surgical History:  Procedure Laterality Date  . CHOLECYSTECTOMY    . HERNIA REPAIR    . OTHER SURGICAL HISTORY     scar tissue removal from bowels  . PARTIAL HYSTERECTOMY     Still has ovaries  . UTERINE FIBROID SURGERY      Family History  Problem Relation Age of Onset  . Hyperlipidemia Mother   . Hyperlipidemia Maternal Aunt   . Breast cancer Cousin     No Known Allergies  CBC Latest Ref Rng & Units 07/04/2020 04/17/2020 11/08/2019  WBC 4.0 - 10.5 K/uL 6.6 5.7 8.7  Hemoglobin 12.0 - 15.0 g/dL 10.3(L) 10.9(L) 11.5(L)  Hematocrit 36.0 - 46.0 % 31.6(L) 33.2(L) 34.6(L)  Platelets 150.0 - 400.0 K/uL 426.0(H) 334 402.0(H)      CMP     Component Value Date/Time   NA 139  07/24/2020 0958   K 4.0 07/24/2020 0958   CL 102 07/24/2020 0958   CO2 30 07/24/2020 0958   GLUCOSE 84 07/24/2020 0958   BUN 18 07/24/2020 0958   BUN 30 (A) 10/15/2017 0000   CREATININE 1.06 07/24/2020 0958   CREATININE 1.07 (H) 04/17/2020 1018   CALCIUM 9.6 07/24/2020 0958   PROT 8.8 (H) 07/04/2020 1113   ALBUMIN 4.2 07/04/2020 1113   AST 15 07/04/2020 1113   ALT 11 07/04/2020 1113   ALKPHOS 95 07/04/2020 1113   BILITOT 0.6 07/04/2020 1113     No results found.     Assessment & Plan:   1. Lymphedema  No surgery or intervention at this point in time.    I have reviewed my discussion with the patient regarding lymphedema and why it  causes symptoms.  Patient will continue wearing graduated compression stockings class 1 (20-30 mmHg) on a daily basis a prescription was given. The patient is reminded to put the stockings on first thing in the morning and removing them in the evening. The patient is instructed specifically not to sleep in the stockings.   In addition, behavioral modification throughout the day will be continued.  This will include frequent elevation (such as in a recliner),  and exercise such as walking.  The patient is encouraged to begin  aggressive use of the  lymph pump.  This will continue to improve the edema control and prevent sequela such as ulcers and infections.   The patient will follow-up with me on an annual basis.    2. Essential hypertension Continue antihypertensive medications as already ordered, these medications have been reviewed and there are no changes at this time.    Current Outpatient Medications on File Prior to Visit  Medication Sig Dispense Refill  . Alpha-Lipoic Acid 300 MG CAPS Take 2 capsules by mouth daily.    Marland Kitchen amLODipine (NORVASC) 5 MG tablet TAKE 1 AND 1/2 TABLETS BY  MOUTH DAILY 131 tablet 3  . aspirin 81 MG tablet Take 81 mg by mouth daily.    . cloNIDine (CATAPRES) 0.2 MG tablet TAKE 1 TABLET BY MOUTH 3  TIMES DAILY 270  tablet 3  . fluticasone (FLONASE) 50 MCG/ACT nasal spray SPRAY 2 SPRAYS INTO EACH NOSTRIL EVERY DAY 48 g 3  . gabapentin (NEURONTIN) 400 MG capsule TAKE 2 CAPSULES BY MOUTH 3  TIMES DAILY 540 capsule 3  .  glipiZIDE (GLUCOTROL) 5 MG tablet TAKE 1 TABLET BY MOUTH EVERY DAY BEFORE BREAKFAST 90 tablet 3  . hydrochlorothiazide (HYDRODIURIL) 25 MG tablet TAKE 1 TABLET BY MOUTH  DAILY 90 tablet 3  . irbesartan (AVAPRO) 300 MG tablet Take 1 tablet (300 mg total) by mouth daily. 90 tablet 3  . metFORMIN (GLUCOPHAGE) 1000 MG tablet Take 1 tablet (1,000 mg total) by mouth 2 (two) times daily with a meal. 180 tablet 3  . methimazole (TAPAZOLE) 5 MG tablet Take by mouth.    . metoprolol tartrate (LOPRESSOR) 50 MG tablet TAKE 1 TABLET BY MOUTH  TWICE DAILY 180 tablet 3  . nystatin cream (MYCOSTATIN) Apply 1 application topically 2 (two) times daily. 30 g 1  . potassium chloride SA (KLOR-CON) 20 MEQ tablet TAKE 1 TABLET BY MOUTH  DAILY 90 tablet 3  . UNABLE TO FIND Med Name: Nyoka Cowden Lipped Muscle oil GLX3     No current facility-administered medications on file prior to visit.    There are no Patient Instructions on file for this visit. No follow-ups on file.   Kris Hartmann, NP

## 2020-08-27 DIAGNOSIS — E113299 Type 2 diabetes mellitus with mild nonproliferative diabetic retinopathy without macular edema, unspecified eye: Secondary | ICD-10-CM | POA: Diagnosis not present

## 2020-08-27 LAB — HM DIABETES EYE EXAM

## 2020-08-28 ENCOUNTER — Other Ambulatory Visit: Payer: Self-pay

## 2020-08-28 ENCOUNTER — Encounter: Payer: Self-pay | Admitting: Family Medicine

## 2020-08-28 ENCOUNTER — Ambulatory Visit
Admission: RE | Admit: 2020-08-28 | Discharge: 2020-08-28 | Disposition: A | Discharge status: Home / Self Care | Payer: Medicare Other | Source: Ambulatory Visit | Attending: Family Medicine | Admitting: Family Medicine

## 2020-08-28 DIAGNOSIS — E2839 Other primary ovarian failure: Secondary | ICD-10-CM

## 2020-08-28 DIAGNOSIS — E11319 Type 2 diabetes mellitus with unspecified diabetic retinopathy without macular edema: Secondary | ICD-10-CM | POA: Insufficient documentation

## 2020-08-28 DIAGNOSIS — Z780 Asymptomatic menopausal state: Secondary | ICD-10-CM | POA: Diagnosis not present

## 2020-08-29 ENCOUNTER — Encounter: Payer: Self-pay | Admitting: Family Medicine

## 2020-09-03 ENCOUNTER — Other Ambulatory Visit: Payer: Self-pay | Admitting: Family Medicine

## 2020-09-03 DIAGNOSIS — M5432 Sciatica, left side: Secondary | ICD-10-CM

## 2020-09-11 DIAGNOSIS — M25561 Pain in right knee: Secondary | ICD-10-CM | POA: Diagnosis not present

## 2020-09-25 DIAGNOSIS — E0500 Thyrotoxicosis with diffuse goiter without thyrotoxic crisis or storm: Secondary | ICD-10-CM | POA: Diagnosis not present

## 2020-09-26 ENCOUNTER — Other Ambulatory Visit (HOSPITAL_BASED_OUTPATIENT_CLINIC_OR_DEPARTMENT_OTHER): Payer: Self-pay

## 2020-09-30 DIAGNOSIS — M1711 Unilateral primary osteoarthritis, right knee: Secondary | ICD-10-CM | POA: Diagnosis not present

## 2020-10-02 ENCOUNTER — Telehealth: Payer: Self-pay | Admitting: Family Medicine

## 2020-10-02 DIAGNOSIS — E119 Type 2 diabetes mellitus without complications: Secondary | ICD-10-CM | POA: Diagnosis not present

## 2020-10-02 DIAGNOSIS — E0500 Thyrotoxicosis with diffuse goiter without thyrotoxic crisis or storm: Secondary | ICD-10-CM | POA: Diagnosis not present

## 2020-10-02 DIAGNOSIS — D509 Iron deficiency anemia, unspecified: Secondary | ICD-10-CM | POA: Diagnosis not present

## 2020-10-07 ENCOUNTER — Other Ambulatory Visit: Payer: Self-pay

## 2020-10-07 ENCOUNTER — Encounter: Payer: Self-pay | Admitting: Obstetrics and Gynecology

## 2020-10-07 ENCOUNTER — Ambulatory Visit (INDEPENDENT_AMBULATORY_CARE_PROVIDER_SITE_OTHER): Payer: Medicare Other | Admitting: Obstetrics and Gynecology

## 2020-10-07 VITALS — BP 162/90 | Ht 65.0 in | Wt 234.1 lb

## 2020-10-07 DIAGNOSIS — Z1239 Encounter for other screening for malignant neoplasm of breast: Secondary | ICD-10-CM

## 2020-10-07 DIAGNOSIS — Z01419 Encounter for gynecological examination (general) (routine) without abnormal findings: Secondary | ICD-10-CM

## 2020-10-07 MED ORDER — NYSTATIN 100000 UNIT/GM EX CREA: 1.0000 "application " | TOPICAL_CREAM | Freq: Two times a day (BID) | CUTANEOUS | 1 refills | Status: AC

## 2020-10-07 NOTE — Progress Notes (Signed)
Gynecology Annual Exam  PCP: Copland, Gay Filler, MD  Chief Complaint:  Chief Complaint  Patient presents with   Gynecologic Exam    Annual - bump on vagina. RM 5    History of Present Illness:Patient is a 70 y.o. G1P0010 presents for annual exam. The patient has no complaints today.   LMP: No LMP recorded. Patient is postmenopausal. No PMB.  Being following for a renal cyst at [facility].    The patient is sexually active. She denies dyspareunia.  The patient does perform self breast exams.  There is no notable family history of breast or ovarian cancer in her family.  The patient wears seatbelts: yes.   The patient has regular exercise: not asked.    The patient denies current symptoms of depression.     Review of Systems: Review of Systems  Constitutional: Negative for chills and fever.  HENT: Negative for congestion.   Respiratory: Negative for cough and shortness of breath.   Cardiovascular: Negative for chest pain and palpitations.  Gastrointestinal: Negative for abdominal pain, constipation, diarrhea, heartburn, nausea and vomiting.  Genitourinary: Negative for dysuria, frequency and urgency.  Musculoskeletal: Positive for back pain.  Skin: Negative for itching and rash.  Neurological: Negative for dizziness and headaches.  Endo/Heme/Allergies: Negative for polydipsia.  Psychiatric/Behavioral: Negative for depression.    Past Medical History:  Patient Active Problem List   Diagnosis Date Noted   Diabetic retinopathy ([place]) 08/28/2020   Mixed incontinence 05/31/2020   Urinary urgency 05/31/2020   Graves disease 04/14/2020    Managed by endocrinology    Right renal mass 11/22/2019   Lymphedema 01/25/2017   Fibroid uterus 01/14/2017   Hypertensive heart disease without heart failure 10/01/2016   Chronic kidney disease, stage 3 ([place]) 09/21/2016    Due to hypertensive nephrosclerosis, perhaps also diabetic nephropathy. Managed by [facility] Dr. Candiss Norse    Meralgia paresthetica of left side 08/27/2015   Diabetes mellitus type 2, controlled ([place]) 08/02/2015   Chronic venous insufficiency 06/28/2015   Essential hypertension 02/15/2015   Peripheral edema 02/15/2015   Chronic knee pain 02/15/2015    Past Surgical History:  Past Surgical History:  Procedure Laterality Date   CHOLECYSTECTOMY     HERNIA REPAIR     OTHER SURGICAL HISTORY     scar tissue removal from bowels   PARTIAL HYSTERECTOMY     Still has ovaries   UTERINE FIBROID SURGERY      Gynecologic History:  No LMP recorded. Patient is postmenopausal. Last Pap: Results were: 01/14/2017 NIL and HR HPV negative  Last mammogram: 07/30/2020 Results were: BI-RAD I  Obstetric History: G1P0010  Family History:  Family History  Problem Relation Age of Onset   Hyperlipidemia Mother    Cancer Sister    Hyperlipidemia Maternal Aunt    Breast cancer Cousin     Social History:  Social History   Socioeconomic History   Marital status: Married    Spouse name: [name]   Number of children: 0   Years of education: 16   Highest education level: Not on file  Occupational History   Occupation: customer service  Tobacco Use   Smoking status: Never Smoker   Smokeless tobacco: Never Used  Scientific laboratory technician Use: Never used  Substance and Sexual Activity   Alcohol use: No    Alcohol/week: 0.0 standard drinks   Drug use: No   Sexual activity: Not Currently    Birth control/protection: Post-menopausal  Other Topics Concern   Not on file  Social History Narrative   Lives with husband   Caffeine use: 16oz coffee per day   Social Determinants of Health   Financial Resource Strain: Low Risk    Difficulty of Paying Living Expenses: Not hard at all  Food Insecurity: No Food Insecurity   Worried About Charity fundraiser in the Last Year: Never true   Arboriculturist in the Last Year: Never true  Transportation Needs:  No Transportation Needs   Lack of Transportation (Medical): No   Lack of Transportation (Non-Medical): No  Physical Activity: Not on file  Stress: Not on file  Social Connections: Not on file  Intimate Partner Violence: Not on file    Allergies:  No Known Allergies  Medications: Prior to Admission medications   Medication Sig Start Date End Date Taking? Authorizing Provider  Alpha-Lipoic Acid 300 MG CAPS Take 2 capsules by mouth daily.    [provider]  amLODipine (NORVASC) 5 MG tablet TAKE 1 AND 1/2 TABLETS BY  MOUTH DAILY 04/29/20   Copland, Gay Filler, MD  aspirin 81 MG tablet Take 81 mg by mouth daily.    [provider]  cloNIDine (CATAPRES) 0.2 MG tablet TAKE 1 TABLET BY MOUTH 3  TIMES DAILY 03/20/20   Copland, Gay Filler, MD  fluticasone (FLONASE) 50 MCG/ACT nasal spray SPRAY 2 SPRAYS INTO EACH NOSTRIL EVERY DAY 01/31/18   Copland, Gay Filler, MD  gabapentin (NEURONTIN) 400 MG capsule TAKE 2 CAPSULES BY MOUTH 3  TIMES DAILY 09/04/20   Copland, Gay Filler, MD  glipiZIDE (GLUCOTROL) 5 MG tablet TAKE 1 TABLET BY MOUTH EVERY DAY BEFORE BREAKFAST 07/24/20   Copland, Gay Filler, MD  hydrochlorothiazide (HYDRODIURIL) 25 MG tablet TAKE 1 TABLET BY MOUTH  DAILY 03/20/20   Copland, Gay Filler, MD  irbesartan (AVAPRO) 300 MG tablet Take 1 tablet (300 mg total) by mouth daily. 05/21/20   Copland, Gay Filler, MD  metFORMIN (GLUCOPHAGE) 1000 MG tablet Take 1 tablet (1,000 mg total) by mouth 2 (two) times daily with a meal. 05/21/20   Copland, Gay Filler, MD  methimazole (TAPAZOLE) 5 MG tablet Take by mouth. 08/02/20   [provider]  metoprolol tartrate (LOPRESSOR) 50 MG tablet TAKE 1 TABLET BY MOUTH  TWICE DAILY 03/20/20   Copland, Gay Filler, MD  nystatin cream (MYCOSTATIN) Apply 1 application topically 2 (two) times daily. 08/15/19   Malachy Mood, MD  potassium chloride SA (KLOR-CON) 20 MEQ tablet TAKE 1 TABLET BY MOUTH  DAILY 03/20/20   Copland, Gay Filler, MD  UNABLE TO FIND Med  Name: Green Lipped Muscle oil GLX3    [provider]    Physical Exam Vitals: Blood pressure (!) 162/90, height 5\' 5"  (1.651 m), weight 234 lb 2 oz (106.2 kg).  General: NAD HEENT: normocephalic, anicteric Thyroid: no enlargement, no palpable nodules Pulmonary: No increased work of breathing, CTAB Cardiovascular: RRR, distal pulses 2+ Breast: Breast symmetrical, no tenderness, no palpable nodules or masses, no skin or nipple retraction present, no nipple discharge.  No axillary or supraclavicular lymphadenopathy. Abdomen: NABS, soft, non-tender, non-distended.  Umbilicus without lesions.  No hepatomegaly, splenomegaly or masses palpable. No evidence of hernia  Genitourinary:  External: Normal external female genitalia.  Normal urethral meatus, normal Bartholin's and Skene's glands.    Vagina: Normal vaginal mucosa, no evidence of prolapse.    Cervix: Grossly normal in appearance, no bleeding  Uterus: Non-enlarged, mobile, normal contour.  No CMT  Adnexa: ovaries non-enlarged, no adnexal masses  Rectal: deferred  Lymphatic: no evidence of inguinal lymphadenopathy Extremities: no edema, erythema, or tenderness Neurologic: Grossly intact Psychiatric: mood appropriate, affect full  Female chaperone present for pelvic and breast  portions of the physical exam  Immunization History  Administered Date(s) Administered   Fluad Quad(high Dose 65+) 04/29/2019, 04/17/2020   Influenza, High Dose Seasonal PF 06/17/2017, 05/26/2018   Influenza,inj,Quad PF,6+ Mos 05/08/2015   Influenza-Unspecified 04/20/2015, 07/17/2016   PFIZER(Purple Top)SARS-COV-2 Vaccination 10/06/2019, 11/01/2019, 06/07/2020   Pneumococcal Conjugate-13 11/25/2017   Pneumococcal Polysaccharide-23 05/08/2015, 05/20/2020   Tdap 12/31/2016   Assessment: 70 y.o. G1P0010 routine annual exam  Plan: Problem List Items Addressed This Visit   None   Visit Diagnoses    Encounter for gynecological examination  without abnormal finding    -  Primary   Breast screening          1) Mammogram - recommend yearly screening mammogram.  Mammogram Is up to date  2) STI screening  was notoffered and therefore not obtained  3) ASCCP guidelines and rational discussed.  Patient opts for discontinue age >84 screening interval  4) Osteoporosis  - Normal bone density 08/28/2020 (hip and forearm, no spine secondary to degenerative changes)   5) Routine healthcare maintenance including cholesterol, diabetes screening discussed managed by PCP  6) Colonoscopy - Cologuard 07/19/2019 negative  7) Return in about 1 year (around 10/07/2021) for Annual.    Malachy Mood, MD [place], [facility] 10/07/2020, 8:31 AM

## 2020-10-10 DIAGNOSIS — M25561 Pain in right knee: Secondary | ICD-10-CM | POA: Diagnosis not present

## 2020-10-10 NOTE — Telephone Encounter (Signed)
Attempted to schedule AWV. Unable to LVM.  Will try at later time.  

## 2020-10-17 DIAGNOSIS — M25561 Pain in right knee: Secondary | ICD-10-CM | POA: Diagnosis not present

## 2020-10-31 DIAGNOSIS — M25561 Pain in right knee: Secondary | ICD-10-CM | POA: Diagnosis not present

## 2020-11-12 ENCOUNTER — Encounter: Payer: Self-pay | Admitting: *Deleted

## 2020-11-12 ENCOUNTER — Encounter: Payer: Medicare Other | Attending: Family Medicine | Admitting: *Deleted

## 2020-11-12 ENCOUNTER — Other Ambulatory Visit: Payer: Self-pay

## 2020-11-12 VITALS — BP 164/82 | Ht 66.0 in | Wt 234.4 lb

## 2020-11-12 DIAGNOSIS — E669 Obesity, unspecified: Secondary | ICD-10-CM | POA: Diagnosis not present

## 2020-11-12 DIAGNOSIS — E119 Type 2 diabetes mellitus without complications: Secondary | ICD-10-CM | POA: Insufficient documentation

## 2020-11-12 NOTE — Progress Notes (Signed)
Diabetes Self-Management Education  Visit Type: First/Initial  Appt. Start Time: 0920 Appt. End Time: 0102 11/12/2020  [name], identified by name and date of birth, is a 70 y.o. female with a diagnosis of Diabetes: Type 2.   ASSESSMENT  Blood pressure (!) 164/82, height 5\' 6"  (1.676 m), weight 234 lb 6.4 oz (106.3 kg). Body mass index is 37.83 kg/m.   Diabetes Self-Management Education - 11/12/20 1129      Visit Information   Visit Type First/Initial      Initial Visit   Diabetes Type Type 2    Are you currently following a meal plan? Yes    What type of meal plan do you follow? "Atkins diet, trying to cut out junk food"    Are you taking your medications as prescribed? Yes   Pt wasn't aware that she needed to take Clonidine 3 x day. Brought her bottles to this appt and will start taking as ordred.   Date Diagnosed 2017      Health Coping   How would you rate your overall health? Fair      Psychosocial Assessment   Patient Belief/Attitude about Diabetes Motivated to manage diabetes   "not good"   Self-care barriers None    Self-management support Doctor's office;Family    Patient Concerns Nutrition/Meal planning;Glycemic Control;Medication;Monitoring;Weight Control;Healthy Lifestyle    Special Needs None    Preferred Learning Style Auditory;Visual    Learning Readiness Change in progress   A1C has decreased in the last year to 6.2 % in Jan   How often do you need to have someone help you when you read instructions, pamphlets, or other written materials from your doctor or pharmacy? 1 - Never    What is the last grade level you completed in school? college      Pre-Education Assessment   Patient understands the diabetes disease and treatment process. Needs Review    Patient understands incorporating nutritional management into lifestyle. Needs Instruction    Patient undertands incorporating physical activity into lifestyle. Needs Instruction    Patient  understands using medications safely. Needs Review    Patient understands monitoring blood glucose, interpreting and using results Needs Instruction    Patient understands prevention, detection, and treatment of acute complications. Needs Instruction    Patient understands prevention, detection, and treatment of chronic complications. Needs Review    Patient understands how to develop strategies to address psychosocial issues. Needs Instruction    Patient understands how to develop strategies to promote health/change behavior. Needs Review      Complications   Last HgB A1C per patient/outside source 6.2 %   07/24/2020   How often do you check your blood sugar? Patient declines   The patient doesn't want to check her blood sugars at home. She will call her insurance plan regarding CGM. She didn't want me to check her blood sugar in the office either.   Have you had a dilated eye exam in the past 12 months? Yes    Have you had a dental exam in the past 12 months? No    Are you checking your feet? Yes    How many days per week are you checking your feet? 7      Dietary Intake   Breakfast skips    Lunch cereal and almond milk; rarely pancakes; bagel with cream cheese    Snack (afternoon) chips, M&M's    Dinner chicken, occasional pork and fis; potatoes, green beans, corn, peas, rice,  pasta, broccoli, cauliflower, collards, salads with lettuce, fruits (berries, apples, grapes), nuts tomatoes, cuccumbers    Snack (evening) chips, popcorn, nuts, M&M's    Beverage(s) water, coffee or tea with Splenda      Exercise   Exercise Type ADL's      Patient Education   Previous Diabetes Education No    Disease state  Definition of diabetes, type 1 and 2, and the diagnosis of diabetes;Factors that contribute to the development of diabetes    Nutrition management  Role of diet in the treatment of diabetes and the relationship between the three main macronutrients and blood glucose level;Food label reading,  portion sizes and measuring food.    Physical activity and exercise  Role of exercise on diabetes management, blood pressure control and cardiac health.    Medications Reviewed patients medication for diabetes, action, purpose, timing of dose and side effects.    Monitoring Identified appropriate SMBG and/or A1C goals.    Chronic complications Relationship between chronic complications and blood glucose control    Psychosocial adjustment Identified and addressed patients feelings and concerns about diabetes      Individualized Goals (developed by patient)   Reducing Risk Other (comment)   improve blood sugars, decrease medications, prevent diabetes complications, lose weight, lead a healthier lifestyle, become more fit     Outcomes   Expected Outcomes Demonstrated interest in learning. Expect positive outcomes    Future DMSE 4-6 wks           Individualized Plan for Diabetes Self-Management Training:   Learning Objective:  Patient will have a greater understanding of diabetes self-management. Patient education plan is to attend individual and/or group sessions per assessed needs and concerns.   Plan:   Patient Instructions  Exercise: Begin chair exercises for  10-15  minutes  3 days a week  Eat 3 meals day,  1-2  snacks a day Space meals 4-6 hours apart Don't skip meals  Limit desserts/sweets  Alllow 2-3 hours between meals and snacks Complete 3 Day Food Record and bring to next appt  Return for appointment on: Wednesday Dec 11, 2020 at 9:30 am with [facility] (dietitian)  Expected Outcomes:  Demonstrated interest in learning. Expect positive outcomes  Education material provided:  General Meal Planning Guidelines Simple Meal Plan 3 Day Food Record Smart Snacking Exercise Handout (ADA)  If problems or questions, patient to contact team via:  Johny Drilling, RN, [place] [phone]  Future DSME appointment: 4-6 wks  The patient didn't want to come to Diabetes classes.  She will attend the 2 Hour Refresher Program and her next appointment is scheduled with the dietitian on Dec 11, 2020.

## 2020-11-12 NOTE — Patient Instructions (Addendum)
Exercise: Begin chair exercises for  10-15  minutes  3 days a week  Eat 3 meals day,  1-2  snacks a day Space meals 4-6 hours apart Don't skip meals  Limit desserts/sweets  Alllow 2-3 hours between meals and snacks Complete 3 Day Food Record and bring to next appt  Return for appointment on: Wednesday Dec 11, 2020 at 9:30 am with [facility] (dietitian)

## 2020-12-04 DIAGNOSIS — M1711 Unilateral primary osteoarthritis, right knee: Secondary | ICD-10-CM | POA: Diagnosis not present

## 2020-12-11 ENCOUNTER — Other Ambulatory Visit: Payer: Self-pay

## 2020-12-11 ENCOUNTER — Encounter: Payer: Medicare Other | Attending: Family Medicine | Admitting: Dietician

## 2020-12-11 ENCOUNTER — Encounter: Payer: Self-pay | Admitting: Dietician

## 2020-12-11 VITALS — BP 144/50 | Ht 66.0 in | Wt 235.1 lb

## 2020-12-11 DIAGNOSIS — E119 Type 2 diabetes mellitus without complications: Secondary | ICD-10-CM | POA: Diagnosis not present

## 2020-12-11 NOTE — Patient Instructions (Addendum)
   Plan to eat a meal or snack every 3-5 hours during the day. For most people this means eating something 3-4 times daily.  Have healthy options on hand to avoid overdoing sweets and chips. Keep a portable snack with you when going to [place] appointments, errands, etc to avoid skipping meals.  Minimum carbohydrate needs = 4 servings of grain/ starchy foods daily + 2-4 servings of fruit daily.   Begin some regular, safe exercise such as stationary bike or pedaling machine.

## 2020-12-11 NOTE — Progress Notes (Signed)
Diabetes Self-Management Education  Visit Type:  Follow-up  Appt. Start Time: 0930 Appt. End Time: 1030  12/11/2020  [name], identified by name and date of birth, is a 70 y.o. female with a diagnosis of Diabetes:  .   ASSESSMENT  Blood pressure (!) 144/50, height 5\' 6"  (1.676 m), weight 235 lb 1.6 oz (106.6 kg). Body mass index is 37.95 kg/m.    Diabetes Self-Management Education - 40/98/11 9147      Complications   How often do you check your blood sugar? 0 times/day (not testing)    Have you had a dilated eye exam in the past 12 months? Yes   reports blurred vision and will schedle appt soon   Have you had a dental exam in the past 12 months? No    Are you checking your feet? Yes    How many days per week are you checking your feet? 7   felt some "lines" of discomfort in great toes for several hours 12/10/20     Dietary Intake   Breakfast 2 meals and 0-2 snacks 12:30pm, 7-8pm, often up late until 12:30    Lunch 12:30pm "breakfast" with 10oz cup of coffee    Snack (afternoon) pralined pecans, occasionally potato chips; cookies ie pecan sandies x4; few m&ms    Dinner 7-8pm      Exercise   Exercise Type ADL's      Patient Education   Nutrition management  Role of diet in the treatment of diabetes and the relationship between the three main macronutrients and blood glucose level;Reviewed blood glucose goals for pre and post meals and how to evaluate the patients' food intake on their blood glucose level.;Meal timing in regards to the patients' current diabetes medication.;Meal options for control of blood glucose level and chronic complications.;Other (comment)   food portions, consistent carb intake, eating at regular intervals, basic meal planning and menus   Physical activity and exercise  Role of exercise on diabetes management, blood pressure control and cardiac health.;Helped patient identify appropriate exercises in relation to his/her diabetes, diabetes  complications and other health issue.    Monitoring Purpose and frequency of SMBG.    Acute complications Taught treatment of hypoglycemia - the 15 rule.      Post-Education Assessment   Patient understands the diabetes disease and treatment process. Demonstrates understanding / competency    Patient understands incorporating nutritional management into lifestyle. Demonstrates understanding / competency    Patient undertands incorporating physical activity into lifestyle. Demonstrates understanding / competency    Patient understands using medications safely. Demonstrates understanding / competency    Patient understands monitoring blood glucose, interpreting and using results Needs Review    Patient understands prevention, detection, and treatment of acute complications. Needs Review    Patient understands prevention, detection, and treatment of chronic complications. Needs Review    Patient understands how to develop strategies to address psychosocial issues. Needs Review    Patient understands how to develop strategies to promote health/change behavior. Demonstrates understanding / competency      Outcomes   Program Status Completed           Learning Objective:  Patient will have a greater understanding of diabetes self-management. Patient education plan is to attend individual and/or group sessions per assessed needs and concerns.  Additional Notes:  Patient is averse to finger prick testing; discussed technique with her and offered help should she change her mind.  She is skipping 1-2 meals daily despite having  hunger symptoms and occasional mild hypoglycemic symptoms. Instructed her on strategies for eating at regular intervals, including simple and balanced meal and snack options.  She will plan to increase her physical activity as her knee pain allows.  Plan:   Patient Instructions   Plan to eat a meal or snack every 3-5 hours during the day. For most people this means  eating something 3-4 times daily.  Have healthy options on hand to avoid overdoing sweets and chips. Keep a portable snack with you when going to [place] appointments, errands, etc to avoid skipping meals.  Minimum carbohydrate needs = 4 servings of grain/ starchy foods daily + 2-4 servings of fruit daily.   Begin some regular, safe exercise such as stationary bike or pedaling machine.    Expected Outcomes:  Demonstrated interest in learning. Expect positive outcomes  Education material provided: [employer], Snacking handout, Tips for Managing Food Cravings  If problems or questions, patient to contact team via:  Phone and Email  Future DSME appointment: -

## 2021-01-07 ENCOUNTER — Other Ambulatory Visit: Payer: Self-pay | Admitting: Family Medicine

## 2021-01-09 ENCOUNTER — Telehealth: Payer: Self-pay

## 2021-01-09 NOTE — Telephone Encounter (Signed)
[employer] called stating patient is diabetic but is not on current statin therapy as this is proven to decrease cardiovascular risk. She is requesting instruction on if patient is to start or decline recommendation. Please advise if necessary.   Insurance call back is [phone]

## 2021-01-10 ENCOUNTER — Encounter: Payer: Self-pay | Admitting: Family Medicine

## 2021-01-10 NOTE — Telephone Encounter (Signed)
Has previously declined statin.  I can ask her again

## 2021-01-17 DIAGNOSIS — E119 Type 2 diabetes mellitus without complications: Secondary | ICD-10-CM | POA: Diagnosis not present

## 2021-01-17 DIAGNOSIS — E0500 Thyrotoxicosis with diffuse goiter without thyrotoxic crisis or storm: Secondary | ICD-10-CM | POA: Diagnosis not present

## 2021-01-17 LAB — BASIC METABOLIC PANEL
BUN: 22 — AB (ref 4–21)
CO2: 28 — AB (ref 13–22)
Chloride: 103 (ref 99–108)
Creatinine: 1.1 (ref <=1.1)
Glucose: 128
Potassium: 4.1 (ref 3.4–5.3)
Sodium: 138 (ref 137–147)

## 2021-01-17 LAB — COMPREHENSIVE METABOLIC PANEL
Calcium: 9.1 (ref 8.7–10.7)
GFR calc non Af Amer: 60

## 2021-01-17 LAB — HEMOGLOBIN A1C: Hemoglobin A1C: 6.3

## 2021-01-17 LAB — LIPID PANEL
Cholesterol: 185 (ref 0–200)
HDL: 65 (ref 35–70)
LDL Cholesterol: 108
Triglycerides: 63 (ref 40–160)

## 2021-01-17 LAB — TSH: TSH: 1.12 (ref <=5.90)

## 2021-01-24 DIAGNOSIS — E119 Type 2 diabetes mellitus without complications: Secondary | ICD-10-CM | POA: Diagnosis not present

## 2021-01-24 DIAGNOSIS — E0500 Thyrotoxicosis with diffuse goiter without thyrotoxic crisis or storm: Secondary | ICD-10-CM | POA: Diagnosis not present

## 2021-02-08 IMAGING — MR MR ABDOMEN WO/W CM
9 of 17 series · 24 of 48 positions shown · IV contrast (GADAVIST)
Comparison: 11/10/2019

CLINICAL DATA: Renal mass follow up from recent lumbar MRI of
11/10/2019.

EXAM:
MRI ABDOMEN WITHOUT AND WITH CONTRAST
TECHNIQUE: Multiplanar multisequence MR imaging of the abdomen was performed
both before and after the administration of intravenous contrast.
CONTRAST:  10mL GADAVIST GADOBUTROL 1 MMOL/ML IV SOLN

[Series 3: T2 · coronal · 7.0mm · 1.33mm/px · 1 of 32 slices shown (1 of 2)]
[im 1/32]
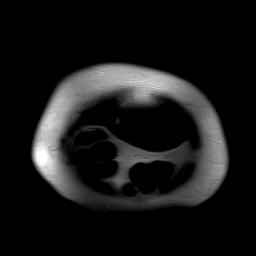

[Series 4: T2 fat-sat · axial · 6.0mm · 1.37mm/px · 1 of 34 slices shown]
[im 1/34]
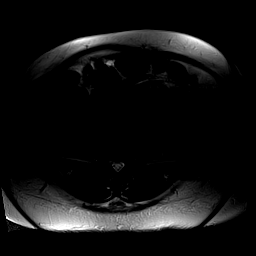

[Series 5: in + out · axial · 6.0mm · 0.70mm/px · z∈[-91,+142]mm · 4 of 64 slices shown]
[im 1/64]
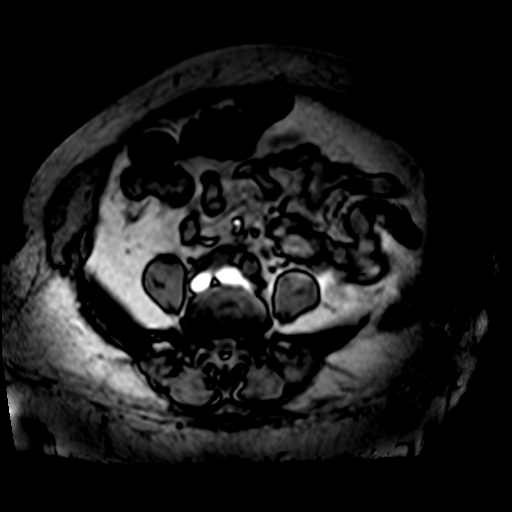
[im 22/64]
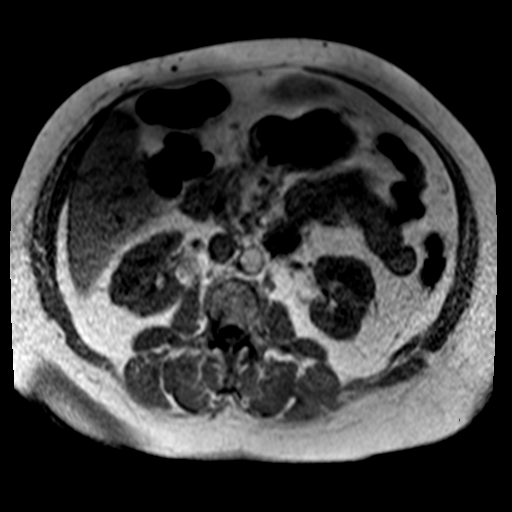
[im 43/64]
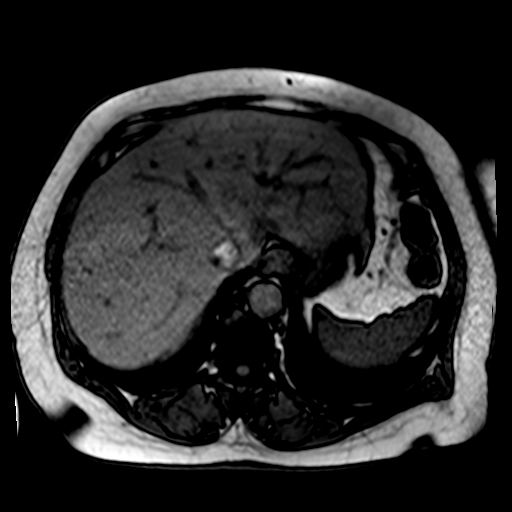
[im 64/64]
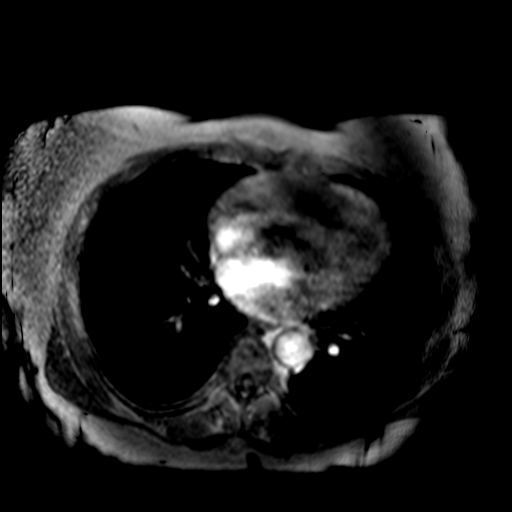

[Series 6: [id number] free breathing · axial · 6.0mm · 1.88mm/px · z∈[-70,+178]mm · 6 of 102 slices shown]
[im 1/102]
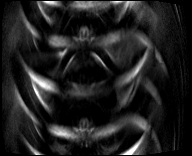
[im 21/102]
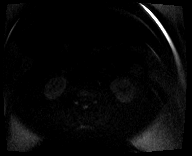
[im 41/102]
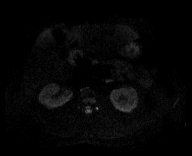
[im 61/102]
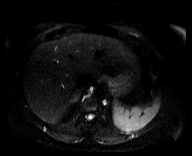
[im 81/102]
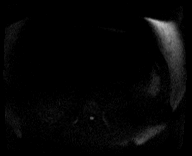
[im 102/102]
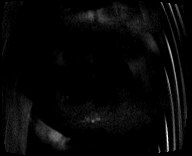

[Series 7: [id number] free breathing_adc · axial · 6.0mm · 1.88mm/px · z∈[-70,+178]mm · 2 of 34 slices shown]
[im 1/34]
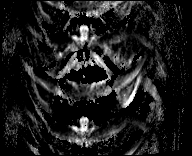
[im 34/34]
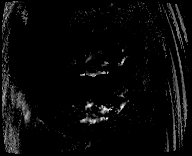

[Series 8: DWI · axial · 6.0mm · 0.70mm/px · z∈[-91,+142]mm · 2 of 32 slices shown]
[im 1/32]
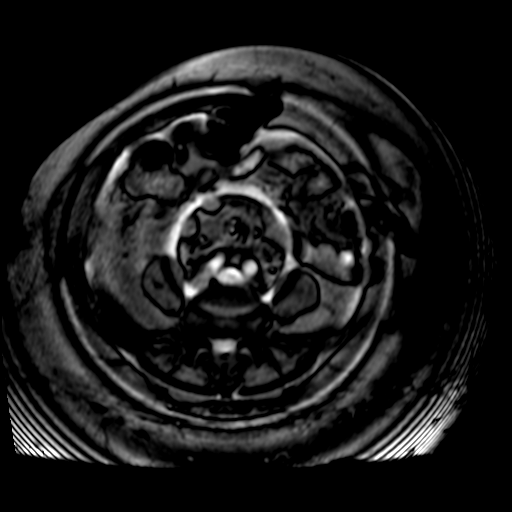
[im 32/32]
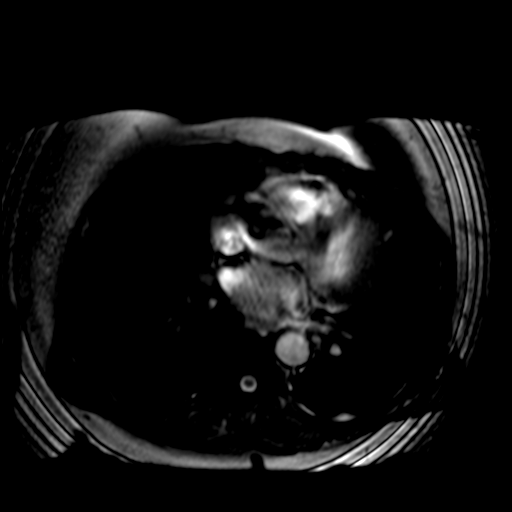

[Series 17: T2 · axial · 6.0mm · 1.41mm/px · z∈[-91,+142]mm · 2 of 32 slices shown (2 of 2)]
[im 1/32]
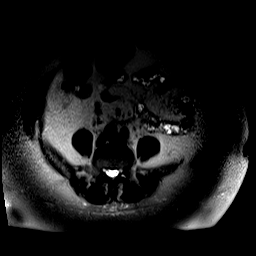
[im 32/32]
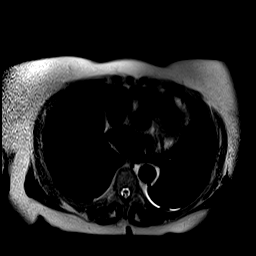

[Series 18: T1 dynamic fat-sat post-contrast · axial · delayed · 5.0mm · 1.25mm/px · z∈[-92,+143]mm · 3 of 48 slices shown (1 of 2)]
[im 1/48]
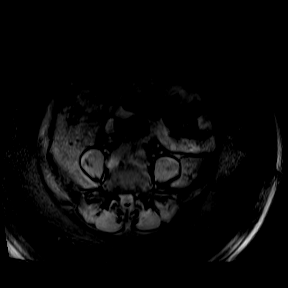
[im 24/48]
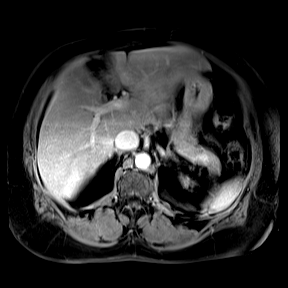
[im 48/48]
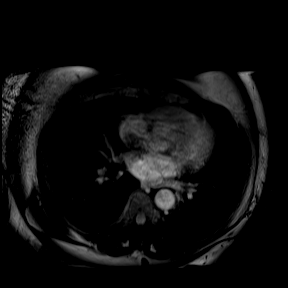

[Series 19: T1 dynamic fat-sat post-contrast · axial · 5.0mm · 1.25mm/px · z∈[-92,+143]mm · 3 of 48 slices shown (2 of 2)]
[im 1/48]
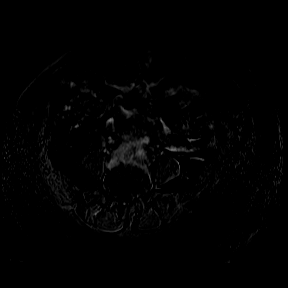
[im 24/48]
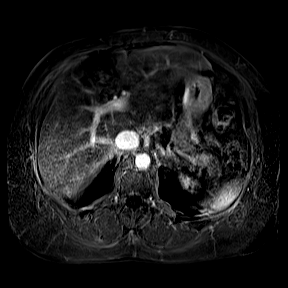
[im 48/48]
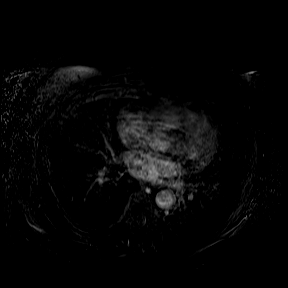

[24 of 48 positions shown; findings below may reference images not displayed]

FINDINGS: Lower chest: Unremarkable

Hepatobiliary: Cholecystectomy. Otherwise unremarkable.

Pancreas:  Unremarkable

Spleen:  Unremarkable

Adrenals/Urinary Tract: Both adrenal glands appear normal. Scattered
small T2 hyperintense lesions of the kidneys are likely cysts.
However, there is also a 2.0 by 2.0 cm lesion of the right mid
kidney medially with high precontrast T1 signal characteristics and
generally low T2 signal characteristics. No restriction of
diffusion. This lesion has internal complexity with some faint
internal speckles of signal on some but not other subtraction images
which may be from misregistration or tiny amounts of enhancement.
Bosniak category IIF.

Stomach/Bowel: Unremarkable

Vascular/Lymphatic:  Unremarkable

Other:  No supplemental non-categorized findings.

Musculoskeletal: Unremarkable
IMPRESSION: 1. The complex lesion of the right mid kidney medially has primarily
high T1 low T2 signal characteristics and on some subtraction images
demonstrates no enhancement, whereas on other subtraction images
there is some questionable speckles of enhancement which may be from
misregistration or very subtle degrees of internal septal
enhancement. Overall given how improve this appears on the
subtraction images, a benign etiology tends to be favored, and
Bosniak category IIF is assigned. Follow up renal protocol CT or MRI
in 6 months time is recommended. This follows guidelines from
publication: Management of the Incidental Renal mass on CT: A White
Paper of the ACR Incidental Findings Committee. [HOSPITAL]

## 2021-02-10 ENCOUNTER — Telehealth: Payer: Self-pay

## 2021-02-10 NOTE — Telephone Encounter (Signed)
FYI: Pt called stated she is having right side chest pain when moving, think she strained a muscle.I advised that this could be true but since the earliest appointment you had was the one she was scheduled in she should be seen in the ER- pt declined and said she would continue tx with Tylenol until her appointment with you.

## 2021-02-10 NOTE — Telephone Encounter (Signed)
I called patient and discussed her chest pain.  She notes it is on the right-hand side.  It is more pronounced when she does things with her arm such as brushing her teeth or moving her head and neck in a certain way.  No chest pain, pain is not exertional.  I advised her that I cannot tell for certain what is causing her pain without seeing her.  Certainly, if getting worse or symptoms change please seek care at the ER.  Otherwise, she prefers not to be seen urgently we will evaluate her in the office later this week

## 2021-02-12 NOTE — Patient Instructions (Addendum)
I would suggest getting the shingles vaccine series and a COVID second booster/fourth dose if not done already We will set you up with  -cardiology in [facility] -urology -ultrasound of the left shoulder area -heart echo/ ultrasound

## 2021-02-12 NOTE — Progress Notes (Signed)
[facility] [address] [place], [place] [address] [id number] [phone]  Date:  02/13/2021   Name:  Emily Wood   DOB:  [birth year]   MRN:  [id number]  PCP:  Darreld Mclean, MD    Chief Complaint: right side chest pain  (Ongoing recently. Just started this month/Off and on )   History of Present Illness:  Emily Wood is a 70 y.o. very pleasant female patient who presents with the following:  Last visit with myself was in January. Patient with history of diabetes, chronic kidney disease, hypertension, venous insufficiency and lymphedema, obesity, Graves' hyperthyroidism in remission  Patient seen today with concern of chest pain which she suspects is musculoskeletal She called [place] a couple of days ago and we made this appointment-I discussed it with her at that time I called patient and discussed her chest pain.  She notes it is on the right-hand side.  It is more pronounced when she does things with her arm such as brushing her teeth or moving her head and neck in a certain way.  No chest pain, pain is not exertional.  I advised her that I cannot tell for certain what is causing her pain without seeing her.  Certainly, if getting worse or symptoms change please seek care at the ER.  Otherwise, she prefers not to be seen urgently we will evaluate her in the office later this week  Her pain went away yesterday - it was present for about a week She is not aware of any injury, no new exercises or other muscle strains Can recommend shingles vaccine COVID booster Can update A1c today-pt brings in recent labs from endocrinology which we will abstract -diabetes has been under good control  Also, she has noticed a bump over her left superior trapezius muscle.  It has been there for some years per her report but is now getting larger.  It is not painful  Lab Results  Component Value Date   HGBA1C 6.3 01/17/2021     Patient Active Problem List   Diagnosis Date Noted   Diabetic retinopathy ([place]) 08/28/2020   Mixed incontinence 05/31/2020   Urinary urgency 05/31/2020   Graves disease 04/14/2020   Right renal mass 11/22/2019   Lymphedema 01/25/2017   Fibroid uterus 01/14/2017   Hypertensive heart disease without heart failure 10/01/2016   Chronic kidney disease, stage 3 ([place]) 09/21/2016   Meralgia paresthetica of left side 08/27/2015   Diabetes mellitus type 2, controlled ([place]) 08/02/2015   Chronic venous insufficiency 06/28/2015   Essential hypertension 02/15/2015   Peripheral edema 02/15/2015   Chronic knee pain 02/15/2015    Past Medical History:  Diagnosis Date   Arthritis    Diabetes mellitus without complication ([place])    Graves disease 04/14/2020   Graves disease 07/20/2020   Graves disease    Hypertension     Past Surgical History:  Procedure Laterality Date   CHOLECYSTECTOMY     HERNIA REPAIR     OTHER SURGICAL HISTORY     scar tissue removal from bowels   PARTIAL HYSTERECTOMY     Still has ovaries   UTERINE FIBROID SURGERY      Social History   Tobacco Use   Smoking status: Never   Smokeless tobacco: Never  Vaping Use   Vaping Use: Never used  Substance Use Topics   Alcohol use: No    Alcohol/week: 0.0 standard drinks  Comment: rarely   Drug use: No    Family History  Problem Relation Age of Onset   Hyperlipidemia Mother    Cancer Sister    Hyperlipidemia Maternal Aunt    Diabetes Maternal Aunt    Breast cancer Cousin    Diabetes Maternal Uncle     No Known Allergies  Medication list has been reviewed and updated.  Current Outpatient Medications on File Prior to Visit  Medication Sig Dispense Refill   Alpha-Lipoic Acid 300 MG CAPS Take 2 capsules by mouth daily.     amLODipine (NORVASC) 5 MG tablet TAKE 1 AND 1/2 TABLETS BY  MOUTH DAILY 135 tablet 0   aspirin 81 MG tablet Take 81 mg by mouth daily.     atorvastatin (LIPITOR) 10 MG tablet Take  10 mg by mouth daily.     cloNIDine (CATAPRES) 0.2 MG tablet TAKE 1 TABLET BY MOUTH 3  TIMES DAILY 270 tablet 3   ferrous sulfate 325 (65 FE) MG tablet Take 325 mg by mouth daily with breakfast.     gabapentin (NEURONTIN) 400 MG capsule TAKE 2 CAPSULES BY MOUTH 3  TIMES DAILY 540 capsule 3   glipiZIDE (GLUCOTROL) 5 MG tablet TAKE 1 TABLET BY MOUTH EVERY DAY BEFORE BREAKFAST 90 tablet 3   hydrochlorothiazide (HYDRODIURIL) 25 MG tablet TAKE 1 TABLET BY MOUTH  DAILY 90 tablet 3   irbesartan (AVAPRO) 300 MG tablet Take 1 tablet (300 mg total) by mouth daily. 90 tablet 3   metFORMIN (GLUCOPHAGE) 1000 MG tablet Take 1 tablet (1,000 mg total) by mouth 2 (two) times daily with a meal. 180 tablet 3   methimazole (TAPAZOLE) 5 MG tablet Take 2.5 mg by mouth daily.     metoprolol tartrate (LOPRESSOR) 50 MG tablet TAKE 1 TABLET BY MOUTH  TWICE DAILY 180 tablet 3   Multiple Vitamin (MULTIVITAMIN ADULT PO) Take 1 tablet by mouth daily.     nystatin cream (MYCOSTATIN) Apply 1 application topically 2 (two) times daily. (Patient taking differently: Apply 1 application topically 2 (two) times daily as needed.) 30 g 1   potassium chloride SA (KLOR-CON) 20 MEQ tablet TAKE 1 TABLET BY MOUTH  DAILY 90 tablet 3   Pyridoxine HCl (VITAMIN B6 PO) Take 1 tablet by mouth daily.     UNABLE TO FIND daily. Med Name: Nyoka Cowden Lipped Muscle oil GLX3     fluticasone (FLONASE) 50 MCG/ACT nasal spray SPRAY 2 SPRAYS INTO EACH NOSTRIL EVERY DAY (Patient not taking: Reported on 02/13/2021) 48 g 3   No current facility-administered medications on file prior to visit.    Review of Systems:  As per HPI- otherwise negative.   Physical Examination: Vitals:   02/13/21 1332  BP: (!) 144/78  Pulse: 69  Temp: 98.2 F (36.8 C)  SpO2: 95%   Vitals:   02/13/21 1332  Weight: 239 lb 9.6 oz (108.7 kg)  Height: '5\' 5"'$  (1.651 m)   Body mass index is 39.87 kg/m. Ideal Body Weight: Weight in (lb) to have BMI = 25: 149.9  GEN: no acute  distress.  Obese, otherwise looks well HEENT: Atraumatic, Normocephalic.  Ears and Nose: No external deformity. CV: RRR, No M/G/R. No JVD. No thrill. No extra heart sounds. PULM: CTA B, no wheezes, crackles, rhonchi. No retractions. No resp. distress. No accessory muscle use. ABD: S, NT, ND, +BS. No rebound. No HSM. EXTR: No c/c/e PSYCH: Normally interactive. Conversant.  There is a firm mass over her left trapezius muscle-nontender, no redness  or fluctuance I am not able to reproduce her right-sided chest tenderness at this time      EKG: There is a nonspecific T wave abnormality with new downgoing T waves in several leads C/w tracing from 2018 new downgoing T waves are present No ST elevation or depression Assessment and Plan: Right-sided chest pain - Plan: EKG 12-Lead, Ambulatory referral to Cardiology, DG Chest 2 View, ECHOCARDIOGRAM COMPLETE  Soft tissue mass - Plan: [place] CHEST SOFT TISSUE  Mixed incontinence - Plan: Ambulatory referral to Urology  Abnormal EKG - Plan: ECHOCARDIOGRAM COMPLETE  Patient seen today with concern of right-sided chest pain.  Likely noncardiac in origin.  EKG shows no acute changes, but does have some T wave changes.  Referral made to cardiology, I will also order an echocardiogram to follow-up EKG changes  She also notes issues with incontinence, she has been seeing a urologist but he is leaving the practice.  She would like to transfer her care to [facility], I have made referral  Ordered ultrasound to evaluate mass of her left shoulder-I advised patient if inconclusive we may need to proceed to a CT This visit occurred during the SARS-CoV-2 public health emergency.  Safety protocols were in place, including screening questions prior to the visit, additional usage of staff PPE, and extensive cleaning of exam room while observing appropriate contact time as indicated for disinfecting solutions.   Signed [name], MD

## 2021-02-13 ENCOUNTER — Other Ambulatory Visit: Payer: Self-pay

## 2021-02-13 ENCOUNTER — Encounter: Payer: Self-pay | Admitting: Family Medicine

## 2021-02-13 ENCOUNTER — Ambulatory Visit: Payer: Medicare Other | Attending: Internal Medicine

## 2021-02-13 ENCOUNTER — Ambulatory Visit (INDEPENDENT_AMBULATORY_CARE_PROVIDER_SITE_OTHER): Payer: Medicare Other | Admitting: Family Medicine

## 2021-02-13 ENCOUNTER — Ambulatory Visit (HOSPITAL_BASED_OUTPATIENT_CLINIC_OR_DEPARTMENT_OTHER)
Admission: RE | Admit: 2021-02-13 | Discharge: 2021-02-13 | Disposition: A | Discharge status: Home / Self Care | Payer: Medicare Other | Source: Ambulatory Visit | Attending: Family Medicine | Admitting: Family Medicine

## 2021-02-13 VITALS — BP 144/78 | HR 69 | Temp 98.2°F | Ht 65.0 in | Wt 239.6 lb

## 2021-02-13 DIAGNOSIS — N3946 Mixed incontinence: Secondary | ICD-10-CM | POA: Diagnosis not present

## 2021-02-13 DIAGNOSIS — Z23 Encounter for immunization: Secondary | ICD-10-CM

## 2021-02-13 DIAGNOSIS — M7989 Other specified soft tissue disorders: Secondary | ICD-10-CM

## 2021-02-13 DIAGNOSIS — R079 Chest pain, unspecified: Secondary | ICD-10-CM

## 2021-02-13 DIAGNOSIS — R9431 Abnormal electrocardiogram [ECG] [EKG]: Secondary | ICD-10-CM

## 2021-02-13 NOTE — Progress Notes (Signed)
Abstracted Labs from Pt ENDO provider.

## 2021-02-13 NOTE — Progress Notes (Signed)
   Covid-19 Vaccination Clinic  Name:  Emily Wood    MRN: [id number] DOB: 07-Mar-1951  02/13/2021  [name] was observed post Covid-19 immunization for 15 minutes without incident. She was provided with Vaccine Information Sheet and instruction to access the V-Safe system.   [name] was instructed to call 911 with any severe reactions post vaccine: Difficulty breathing  Swelling of face and throat  A fast heartbeat  A bad rash all over body  Dizziness and weakness   Immunizations Administered     Name Date Dose VIS Date Route   PFIZER Comrnaty(Gray TOP) Covid-19 Vaccine 02/13/2021  2:46 PM 0.3 mL 06/27/2020 Intramuscular   Manufacturer: [place]   Lot: [id number]   NDC: [phone]

## 2021-02-18 ENCOUNTER — Other Ambulatory Visit (HOSPITAL_BASED_OUTPATIENT_CLINIC_OR_DEPARTMENT_OTHER): Payer: Self-pay

## 2021-02-18 DIAGNOSIS — M1990 Unspecified osteoarthritis, unspecified site: Secondary | ICD-10-CM | POA: Insufficient documentation

## 2021-02-18 MED ORDER — COVID-19 MRNA VAC-TRIS(PFIZER) 30 MCG/0.3ML IM SUSP
INTRAMUSCULAR | 0 refills | Status: DC
  Filled 2021-02-18: qty 0.3, 1d supply, fill #0

## 2021-02-19 ENCOUNTER — Ambulatory Visit (HOSPITAL_BASED_OUTPATIENT_CLINIC_OR_DEPARTMENT_OTHER)
Admission: RE | Admit: 2021-02-19 | Discharge: 2021-02-19 | Disposition: A | Discharge status: Home / Self Care | Payer: Medicare Other | Source: Ambulatory Visit | Attending: Family Medicine | Admitting: Family Medicine

## 2021-02-19 ENCOUNTER — Other Ambulatory Visit: Payer: Self-pay

## 2021-02-19 ENCOUNTER — Encounter: Payer: Self-pay | Admitting: Cardiology

## 2021-02-19 ENCOUNTER — Ambulatory Visit: Payer: Medicare Other | Admitting: Cardiology

## 2021-02-19 VITALS — BP 179/83 | HR 58 | Ht 66.0 in | Wt 237.0 lb

## 2021-02-19 DIAGNOSIS — M7592 Shoulder lesion, unspecified, left shoulder: Secondary | ICD-10-CM | POA: Diagnosis not present

## 2021-02-19 DIAGNOSIS — E782 Mixed hyperlipidemia: Secondary | ICD-10-CM

## 2021-02-19 DIAGNOSIS — R9431 Abnormal electrocardiogram [ECG] [EKG]: Secondary | ICD-10-CM

## 2021-02-19 DIAGNOSIS — R072 Precordial pain: Secondary | ICD-10-CM | POA: Diagnosis not present

## 2021-02-19 DIAGNOSIS — E669 Obesity, unspecified: Secondary | ICD-10-CM

## 2021-02-19 DIAGNOSIS — I10 Essential (primary) hypertension: Secondary | ICD-10-CM | POA: Diagnosis not present

## 2021-02-19 DIAGNOSIS — M7989 Other specified soft tissue disorders: Secondary | ICD-10-CM | POA: Diagnosis not present

## 2021-02-19 DIAGNOSIS — R2232 Localized swelling, mass and lump, left upper limb: Secondary | ICD-10-CM | POA: Diagnosis not present

## 2021-02-19 MED ORDER — CARVEDILOL 12.5 MG PO TABS: 12.5000 mg | ORAL_TABLET | Freq: Two times a day (BID) | ORAL | 3 refills | Status: DC

## 2021-02-19 NOTE — Patient Instructions (Addendum)
Medication Instructions:  Your physician has recommended you make the following change in your medication:  STOP: Lopressor  START: Coreg 12.5 mg twice daily *If you need a refill on your cardiac medications before your next appointment, please call your pharmacy*   Lab Work: Your physician recommends that you return for lab work in:  3-7 days before CT scan:  BMET, Mag If you have labs (blood work) drawn today and your tests are completely normal, you will receive your results only by: [place] (if you have MyChart) OR A paper copy in the mail If you have any lab test that is abnormal or we need to change your treatment, we will call you to review the results.   Testing/Procedures:   Your cardiac CT will be scheduled at one of the below locations:   [facility] [address] [place], [place] [address] [phone]   If scheduled at [facility], please arrive at the [facility] main entrance (entrance A) of [facility] 30 minutes prior to test start time. Proceed to the [facility] Radiology Department (first floor) to check-in and test prep.  Please follow these instructions carefully (unless otherwise directed):    On the Night Before the Test: Be sure to Drink plenty of water. Do not consume any caffeinated/decaffeinated beverages or chocolate 12 hours prior to your test. Do not take any antihistamines 12 hours prior to your test.  On the Day of the Test: Drink plenty of water until 1 hour prior to the test. Do not eat any food 4 hours prior to the test. You may take your regular medications prior to the test.  Take Coreg two hours prior to test. HOLD Hydrochlorothiazide morning of the test. FEMALES- please wear underwire-free bra if available, avoid dresses & tight clothing  After the Test: Drink plenty of water. After receiving IV contrast, you may experience a mild flushed feeling. This is normal. On occasion, you may  experience a mild rash up to 24 hours after the test. This is not dangerous. If this occurs, you can take Benadryl 25 mg and increase your fluid intake. If you experience trouble breathing, this can be serious. If it is severe call 911 IMMEDIATELY. If it is mild, please call our office. If you take any of these medications: Glipizide/Metformin, Avandament, Glucavance, please do not take 48 hours after completing test unless otherwise instructed.  Please allow 2-4 weeks for scheduling of routine cardiac CTs. Some insurance companies require a pre-authorization which may delay scheduling of this test.   For non-scheduling related questions, please contact the cardiac imaging nurse navigator should you have any questions/concerns: Marchia Bond, Cardiac Imaging Nurse Navigator Gordy Clement, Cardiac Imaging Nurse Navigator [facility] Direct Office Dial: [phone]   For scheduling needs, including cancellations and rescheduling, please call [place], [phone].    Follow-Up: At [facility], you and your health needs are our priority.  As part of our continuing mission to provide you with exceptional heart care, we have created designated Provider Care Teams.  These Care Teams include your primary Cardiologist (physician) and Advanced Practice Providers (APPs -  Physician Assistants and Nurse Practitioners) who all work together to provide you with the care you need, when you need it.  We recommend signing up for the patient portal called "MyChart".  Sign up information is provided on this After Visit Summary.  MyChart is used to connect with patients for Virtual Visits (Telemedicine).  Patients  are able to view lab/test results, encounter notes, upcoming appointments, etc.  Non-urgent messages can be sent to your provider as well.   To learn more about what you can do with MyChart, go to [email or web address].    Your next appointment:   12 week(s)  The  format for your next appointment:   In Person  Provider:   [address] - Berniece Salines, DO    Other Instructions

## 2021-02-19 NOTE — Progress Notes (Signed)
Cardiology Office Note:    Date:  02/20/2021   ID:  Emily Wood, DOB [birth year], MRN [id number]  PCP:  Darreld Mclean, MD  Cardiologist:  Berniece Salines, DO  Electrophysiologist:  None   Referring MD: Darreld Mclean, MD    History of Present Illness:    Emily Wood is a 70 y.o. female with a hx of HTN, HLD, Type 2 Diabetes and Graves disease who presents for evaluation of chest pain.  Patient developed right sided chest pain at the beginning of July (1 month ago). The pain was constant and lasted 5 days. Described as dull pain, worse with movement. It recurred at the end of July (~1 week ago) and was similar in nature. This time it resolved after 4 days. She saw her PCP who ordered an echo and recommended cardiology eval.  With regard to her blood pressure, patient reports good compliance with her medications. Does not check her BP at home. Does not limit sodium intake.  Patient denies any significant family hx of cardiac disease. She does not smoke. Last A1c 6.3% one month ago.  Past Medical History:  Diagnosis Date   Arthritis    Diabetes mellitus without complication ([place])    Graves disease 04/14/2020   Graves disease 07/20/2020   Graves disease    Hypertension    Mixed hyperlipidemia 02/20/2021    Past Surgical History:  Procedure Laterality Date   CHOLECYSTECTOMY     HERNIA REPAIR     OTHER SURGICAL HISTORY     scar tissue removal from bowels   PARTIAL HYSTERECTOMY     Still has ovaries   UTERINE FIBROID SURGERY      Current Medications: Current Meds  Medication Sig   Alpha-Lipoic Acid 300 MG CAPS Take 2 capsules by mouth daily.   amLODipine (NORVASC) 5 MG tablet TAKE 1 AND 1/2 TABLETS BY  MOUTH DAILY   aspirin 81 MG tablet Take 81 mg by mouth daily.   atorvastatin (LIPITOR) 10 MG tablet Take 10 mg by mouth daily.   carvedilol (COREG) 12.5 MG tablet Take 1 tablet (12.5 mg total) by mouth 2 (two) times daily.   cloNIDine (CATAPRES) 0.2 MG  tablet TAKE 1 TABLET BY MOUTH 3  TIMES DAILY   ferrous sulfate 325 (65 FE) MG tablet Take 325 mg by mouth daily with breakfast.   fluticasone (FLONASE) 50 MCG/ACT nasal spray SPRAY 2 SPRAYS INTO EACH NOSTRIL EVERY DAY   gabapentin (NEURONTIN) 400 MG capsule TAKE 2 CAPSULES BY MOUTH 3  TIMES DAILY   glipiZIDE (GLUCOTROL) 5 MG tablet TAKE 1 TABLET BY MOUTH EVERY DAY BEFORE BREAKFAST   hydrochlorothiazide (HYDRODIURIL) 25 MG tablet TAKE 1 TABLET BY MOUTH  DAILY   irbesartan (AVAPRO) 300 MG tablet Take 1 tablet (300 mg total) by mouth daily.   metFORMIN (GLUCOPHAGE) 1000 MG tablet Take 1 tablet (1,000 mg total) by mouth 2 (two) times daily with a meal.   methimazole (TAPAZOLE) 5 MG tablet Take 2.5 mg by mouth daily.   Multiple Vitamin (MULTIVITAMIN ADULT PO) Take 1 tablet by mouth daily.   nystatin cream (MYCOSTATIN) Apply 1 application topically 2 (two) times daily. (Patient taking differently: Apply 1 application topically 2 (two) times daily as needed.)   potassium chloride SA (KLOR-CON) 20 MEQ tablet TAKE 1 TABLET BY MOUTH  DAILY   Pyridoxine HCl (VITAMIN B6 PO) Take 1 tablet by mouth daily.   UNABLE TO FIND daily. Med Name: Green Lipped Muscle oil Emily Wood   [  DISCONTINUED] metoprolol tartrate (LOPRESSOR) 50 MG tablet TAKE 1 TABLET BY MOUTH  TWICE DAILY     Allergies:   Patient has no known allergies.   Social History   Socioeconomic History   Marital status: Married    Spouse name: [name]   Number of children: 0   Years of education: 16   Highest education level: Not on file  Occupational History   Occupation: customer service  Tobacco Use   Smoking status: Never   Smokeless tobacco: Never  Vaping Use   Vaping Use: Never used  Substance and Sexual Activity   Alcohol use: No    Alcohol/week: 0.0 standard drinks    Comment: rarely   Drug use: No   Sexual activity: Not Currently    Birth control/protection: Post-menopausal  Other Topics Concern   Not on file  Social History  Narrative   Lives with husband   Caffeine use: 16oz coffee per day   Social Determinants of Health   Financial Resource Strain: Not on file  Food Insecurity: Not on file  Transportation Needs: Not on file  Physical Activity: Not on file  Stress: Not on file  Social Connections: Not on file     Family History: The patient's family history includes Breast cancer in her cousin; Cancer in her sister; Diabetes in her maternal aunt and maternal uncle; Hyperlipidemia in her maternal aunt and mother.  ROS:   Review of Systems  Constitution: Negative for decreased appetite, fever and weight gain.  HENT: Negative for congestion, ear discharge, hoarse voice and sore throat.   Eyes: Negative for discharge, redness, vision loss in right eye and visual halos.  Cardiovascular: Negative for dyspnea on exertion, leg swelling, orthopnea and palpitations. No chest pain currently. Respiratory: Negative for cough, hemoptysis, shortness of breath and snoring.   Endocrine: Negative for heat intolerance and polyphagia.  Hematologic/Lymphatic: Negative for bleeding problem. Does not bruise/bleed easily.  Skin: Negative for flushing, nail changes, rash and suspicious lesions.  Musculoskeletal: Negative for arthritis, joint pain, muscle cramps, myalgias, neck pain and stiffness.  Gastrointestinal: Negative for abdominal pain, bowel incontinence, diarrhea and excessive appetite.  Genitourinary: Negative for decreased libido, genital sores and incomplete emptying.  Neurological: Negative for brief paralysis, focal weakness, headaches and loss of balance.  Psychiatric/Behavioral: Negative for altered mental status, depression and suicidal ideas.  Allergic/Immunologic: Negative for HIV exposure and persistent infections.    EKGs/Labs/Other Studies Reviewed:    The following studies were reviewed today: Echo 09/16/2016: EF 60-65%, moderate LVH, grade 2 diastolic dysfunction, mild mitral regurg, mildly dilated  RA, moderately increased systolic pulmonary artery pressure.  EKG: done by PCP on 02/13/2021. Shows sinus bradycardia at 59bpm, nonspecific T wave abnormality (T wave inversion in I and aVL) which is new from 2018.  Recent Labs: 07/04/2020: ALT 11; Hemoglobin 10.3; Platelets 426.0 01/17/2021: BUN 22; Creatinine 1.1; Potassium 4.1; Sodium 138; TSH 1.12  Recent Lipid Panel    Component Value Date/Time   CHOL 185 01/17/2021 0000   TRIG 63 01/17/2021 0000   HDL 65 01/17/2021 0000   CHOLHDL 4 07/06/2019 0942   VLDL 25.8 07/06/2019 0942   LDLCALC 108 01/17/2021 0000    Physical Exam:    VS:  BP (!) 179/83 (BP Location: Left Arm, Patient Position: Sitting, Cuff Size: Large)   Pulse (!) 58   Ht '5\' 6"'$  (1.676 m)   Wt 237 lb 0.6 oz (107.5 kg)   SpO2 98%   BMI 38.26 kg/m  Wt Readings from Last 3 Encounters:  02/19/21 237 lb 0.6 oz (107.5 kg)  02/13/21 239 lb 9.6 oz (108.7 kg)  12/11/20 235 lb 1.6 oz (106.6 kg)     GEN: Well nourished, well developed in no acute distress HEENT: Normal NECK: No JVD; No carotid bruits LYMPHATICS: No lymphadenopathy CARDIAC: S1S2 noted,RRR, no murmurs, rubs, gallops RESPIRATORY:  Clear to auscultation without rales, wheezing or rhonchi  ABDOMEN: Soft, non-tender, non-distended, +bowel sounds, no guarding. EXTREMITIES: No edema, No cyanosis, no clubbing MUSCULOSKELETAL:  No deformity  SKIN: Warm and dry NEUROLOGIC:  Alert and oriented x 3, non-focal PSYCHIATRIC:  Normal affect, good insight  ASSESSMENT:    1. Precordial pain   2. Nonspecific abnormal electrocardiogram (ECG) (EKG)   3. Hypertension, unspecified type   4. Mixed hyperlipidemia   5. Obesity (BMI 30-39.9)    PLAN:     Chest Pain: patient's description of right sided chest pain that is constant for 4-5 days is fairly atypical for cardiac etiology. However, given that she has diabetes and is female, she is more likely to present atypically. She has several risk factors including  obesity, diabetes, HTN. Also noted to have T wave inversions in I and aVL which are new from prior. Therefore, will pursue an ischemic evaluation in this patient.  We discussed testing since shared decision a coronary CTA will be o obtained from  Hypertension: BP poorly controlled. Will discontinue her Metoprolol '50mg'$  BID and start Carvedilol 12.'5mg'$  BID for hopefully improved BP control. Continue amlodipine 7.'5mg'$  daily, clonidine 0.'2mg'$  TID, HCTZ '25mg'$  daily, and irbesartan '300mg'$  daily. Advised patient to limit sodium intake and begin monitoring BP at home. Check BMP and mag today.  Obesity-the patient understands the need to lose weight with diet and exercise. We have discussed specific strategies for this.  Hyperlipidemia - continue with current statin medication.  The patient is in agreement with the above plan. The patient left the office in stable condition.  The patient will follow up in 12 weeks.   Medication Adjustments/Labs and Tests Ordered: Current medicines are reviewed at length with the patient today.  Concerns regarding medicines are outlined above.  Orders Placed This Encounter  Procedures   CT CORONARY MORPH W/CTA COR W/SCORE W/CA W/CM &/OR WO/CM   Basic metabolic panel   Magnesium   Meds ordered this encounter  Medications   carvedilol (COREG) 12.5 MG tablet    Sig: Take 1 tablet (12.5 mg total) by mouth 2 (two) times daily.    Dispense:  180 tablet    Refill:  3    Patient Instructions  Medication Instructions:  Your physician has recommended you make the following change in your medication:  STOP: Lopressor  START: Coreg 12.5 mg twice daily *If you need a refill on your cardiac medications before your next appointment, please call your pharmacy*   Lab Work: Your physician recommends that you return for lab work in:  3-7 days before CT scan:  BMET, Mag If you have labs (blood work) drawn today and your tests are completely normal, you will receive your results  only by: [place] (if you have MyChart) OR A paper copy in the mail If you have any lab test that is abnormal or we need to change your treatment, we will call you to review the results.   Testing/Procedures:   Your cardiac CT will be scheduled at one of the below locations:   [facility] [address] [place], [place] [address] [phone]  [id number]   If scheduled at [facility], please arrive at the [facility] main entrance (entrance A) of [facility] 30 minutes prior to test start time. Proceed to the [facility] Radiology Department (first floor) to check-in and test prep.  Please follow these instructions carefully (unless otherwise directed):    On the Night Before the Test: Be sure to Drink plenty of water. Do not consume any caffeinated/decaffeinated beverages or chocolate 12 hours prior to your test. Do not take any antihistamines 12 hours prior to your test.  On the Day of the Test: Drink plenty of water until 1 hour prior to the test. Do not eat any food 4 hours prior to the test. You may take your regular medications prior to the test.  Take Coreg two hours prior to test. HOLD Hydrochlorothiazide morning of the test. FEMALES- please wear underwire-free bra if available, avoid dresses & tight clothing  After the Test: Drink plenty of water. After receiving IV contrast, you may experience a mild flushed feeling. This is normal. On occasion, you may experience a mild rash up to 24 hours after the test. This is not dangerous. If this occurs, you can take Benadryl 25 mg and increase your fluid intake. If you experience trouble breathing, this can be serious. If it is severe call 911 IMMEDIATELY. If it is mild, please call our office. If you take any of these medications: Glipizide/Metformin, Avandament, Glucavance, please do not take 48 hours after completing test unless otherwise instructed.  Please allow 2-4 weeks for scheduling of  routine cardiac CTs. Some insurance companies require a pre-authorization which may delay scheduling of this test.   For non-scheduling related questions, please contact the cardiac imaging nurse navigator should you have any questions/concerns: Marchia Bond, Cardiac Imaging Nurse Navigator Gordy Clement, Cardiac Imaging Nurse Navigator [facility] Direct Office Dial: [phone]   For scheduling needs, including cancellations and rescheduling, please call [place], [phone].    Follow-Up: At [facility], you and your health needs are our priority.  As part of our continuing mission to provide you with exceptional heart care, we have created designated Provider Care Teams.  These Care Teams include your primary Cardiologist (physician) and Advanced Practice Providers (APPs -  Physician Assistants and Nurse Practitioners) who all work together to provide you with the care you need, when you need it.  We recommend signing up for the patient portal called "MyChart".  Sign up information is provided on this After Visit Summary.  MyChart is used to connect with patients for Virtual Visits (Telemedicine).  Patients are able to view lab/test results, encounter notes, upcoming appointments, etc.  Non-urgent messages can be sent to your provider as well.   To learn more about what you can do with MyChart, go to [email or web address].    Your next appointment:   12 week(s)  The format for your next appointment:   In Person  Provider:   [address] - Berniece Salines, DO    Other Instructions    Adopting a Healthy Lifestyle.  Know what a healthy weight is for you (roughly BMI <25) and aim to maintain this   Aim for 7+ servings of fruits and vegetables daily   65-80+ fluid ounces of water or unsweet tea for healthy kidneys   Limit to max 1 drink of alcohol per day; avoid smoking/tobacco   Limit animal fats in diet for cholesterol and heart health -  choose grass fed whenever available  Avoid highly processed foods, and foods high in saturated/trans fats   Aim for low stress - take time to unwind and care for your mental health   Aim for 150 min of moderate intensity exercise weekly for heart health, and weights twice weekly for bone health   Aim for 7-9 hours of sleep daily   When it comes to diets, agreement about the perfect plan isnt easy to find, even among the experts. Experts at the [employer] developed an idea known as the Healthy Eating Plate. Just imagine a plate divided into logical, healthy portions.   The emphasis is on diet quality:   Load up on vegetables and fruits - one-half of your plate: Aim for color and variety, and remember that potatoes dont count.   Go for whole grains - one-quarter of your plate: Whole wheat, barley, wheat berries, quinoa, oats, brown rice, and foods made with them. If you want pasta, go with whole wheat pasta.   Protein power - one-quarter of your plate: Fish, chicken, beans, and nuts are all healthy, versatile protein sources. Limit red meat.   The diet, however, does go beyond the plate, offering a few other suggestions.   Use healthy plant oils, such as olive, canola, soy, corn, sunflower and peanut. Check the labels, and avoid partially hydrogenated oil, which have unhealthy trans fats.   If youre thirsty, drink water. Coffee and tea are good in moderation, but skip sugary drinks and limit milk and dairy products to one or two daily servings.   The type of carbohydrate in the diet is more important than the amount. Some sources of carbohydrates, such as vegetables, fruits, whole grains, and beans-are healthier than others.   Finally, stay active  Signed, Berniece Salines, DO  02/20/2021 9:02 AM    [place]

## 2021-02-20 ENCOUNTER — Ambulatory Visit (HOSPITAL_COMMUNITY): Payer: Medicare Other | Attending: Cardiology

## 2021-02-20 ENCOUNTER — Encounter: Payer: Self-pay | Admitting: Cardiology

## 2021-02-20 DIAGNOSIS — E782 Mixed hyperlipidemia: Secondary | ICD-10-CM | POA: Insufficient documentation

## 2021-02-20 DIAGNOSIS — R072 Precordial pain: Secondary | ICD-10-CM | POA: Insufficient documentation

## 2021-02-20 DIAGNOSIS — R079 Chest pain, unspecified: Secondary | ICD-10-CM | POA: Insufficient documentation

## 2021-02-20 DIAGNOSIS — E669 Obesity, unspecified: Secondary | ICD-10-CM | POA: Insufficient documentation

## 2021-02-20 DIAGNOSIS — I10 Essential (primary) hypertension: Secondary | ICD-10-CM | POA: Insufficient documentation

## 2021-02-20 DIAGNOSIS — R9431 Abnormal electrocardiogram [ECG] [EKG]: Secondary | ICD-10-CM | POA: Insufficient documentation

## 2021-02-20 HISTORY — DX: Mixed hyperlipidemia: E78.2

## 2021-02-20 MED ORDER — PERFLUTREN LIPID MICROSPHERE
1.0000 mL | INTRAVENOUS | Status: AC | PRN
  Administered 2021-02-20: 1 mL via INTRAVENOUS

## 2021-02-21 ENCOUNTER — Telehealth: Payer: Self-pay | Admitting: Cardiology

## 2021-02-21 ENCOUNTER — Encounter: Payer: Self-pay | Admitting: Family Medicine

## 2021-02-21 LAB — ECHOCARDIOGRAM COMPLETE
Area-P 1/2: 3.26 cm2
S' Lateral: 2.9 cm

## 2021-02-21 MED ORDER — CARVEDILOL 12.5 MG PO TABS: 12.5000 mg | ORAL_TABLET | Freq: Two times a day (BID) | ORAL | 3 refills | Status: DC

## 2021-02-21 NOTE — Telephone Encounter (Signed)
Spoke with the patient, address in detail the reason for the CT. She verbalized understanding.

## 2021-02-21 NOTE — Telephone Encounter (Signed)
Med sent to requested pharmacy 

## 2021-02-21 NOTE — Telephone Encounter (Signed)
   Pt would like to speak with Dr. Terrial Rhodes nurse, she wanted to know why she needs to get CT scan

## 2021-02-21 NOTE — Telephone Encounter (Signed)
*  STAT* If patient is at the pharmacy, call can be transferred to refill team.   1. Which medications need to be refilled? (please list name of each medication and dose if known)  carvedilol (COREG) 12.5 MG tablet one tablet twice daily   2. Which pharmacy/location (including street and city if local pharmacy) is medication to be sent to? [facility]  ([place], [place]  3. Do they need a 30 day or 90 day supply? 90 with refills   Patient [facility] rx at [facility]. She needs all long term medications to go to [facility] not her local [facility]

## 2021-02-24 ENCOUNTER — Other Ambulatory Visit: Payer: Self-pay | Admitting: Family Medicine

## 2021-02-24 ENCOUNTER — Telehealth: Payer: Self-pay

## 2021-02-24 DIAGNOSIS — M7989 Other specified soft tissue disorders: Secondary | ICD-10-CM

## 2021-02-24 DIAGNOSIS — R9431 Abnormal electrocardiogram [ECG] [EKG]: Secondary | ICD-10-CM | POA: Diagnosis not present

## 2021-02-24 DIAGNOSIS — R072 Precordial pain: Secondary | ICD-10-CM | POA: Diagnosis not present

## 2021-02-24 NOTE — Telephone Encounter (Signed)
Called patient to go over her instructions. She verbalized understanding and thanked me for calling her.

## 2021-02-24 NOTE — Telephone Encounter (Signed)
Spoke with patient, see chart.    

## 2021-02-25 LAB — BASIC METABOLIC PANEL
BUN/Creatinine Ratio: 17 (ref 12–28)
BUN: 19 mg/dL (ref 8–27)
CO2: 21 mmol/L (ref 20–29)
Calcium: 9.7 mg/dL (ref 8.7–10.3)
Chloride: 101 mmol/L (ref 96–106)
Creatinine, Ser: 1.11 mg/dL — ABNORMAL HIGH (ref 0.57–1.00)
Glucose: 104 mg/dL — ABNORMAL HIGH (ref 65–99)
Potassium: 4.1 mmol/L (ref 3.5–5.2)
Sodium: 140 mmol/L (ref 134–144)
eGFR: 54 mL/min/{1.73_m2} — ABNORMAL LOW (ref >59)

## 2021-02-25 LAB — MAGNESIUM: Magnesium: 1.6 mg/dL (ref 1.6–2.3)

## 2021-02-26 ENCOUNTER — Other Ambulatory Visit: Payer: Self-pay | Admitting: Family Medicine

## 2021-02-26 ENCOUNTER — Encounter: Payer: Self-pay | Admitting: Family Medicine

## 2021-02-26 DIAGNOSIS — R609 Edema, unspecified: Secondary | ICD-10-CM

## 2021-02-26 DIAGNOSIS — I10 Essential (primary) hypertension: Secondary | ICD-10-CM

## 2021-02-27 ENCOUNTER — Telehealth (HOSPITAL_COMMUNITY): Payer: Self-pay | Admitting: *Deleted

## 2021-02-27 DIAGNOSIS — H43813 Vitreous degeneration, bilateral: Secondary | ICD-10-CM | POA: Diagnosis not present

## 2021-02-27 DIAGNOSIS — E119 Type 2 diabetes mellitus without complications: Secondary | ICD-10-CM | POA: Diagnosis not present

## 2021-02-27 NOTE — Telephone Encounter (Signed)
Reaching out to patient to offer assistance regarding upcoming cardiac imaging study; pt verbalizes understanding of appt date/time, parking situation and where to check in, pre-test NPO status and medications ordered, and verified current allergies; name and call back number provided for further questions should they arise  Gordy Clement RN Navigator Cardiac Imaging [facility] [phone] office [phone] cell  Patient to take '50mg'$  metoprolol tartrate 2 hours prior to cardiac CT scan.

## 2021-03-03 ENCOUNTER — Ambulatory Visit (HOSPITAL_COMMUNITY)
Admission: RE | Admit: 2021-03-03 | Discharge: 2021-03-03 | Disposition: A | Discharge status: Home / Self Care | Payer: Medicare Other | Source: Ambulatory Visit | Attending: Cardiovascular Disease | Admitting: Cardiovascular Disease

## 2021-03-03 ENCOUNTER — Ambulatory Visit (HOSPITAL_COMMUNITY)
Admission: RE | Admit: 2021-03-03 | Discharge: 2021-03-03 | Disposition: A | Discharge status: Home / Self Care | Payer: Medicare Other | Source: Ambulatory Visit | Attending: Cardiology | Admitting: Cardiology

## 2021-03-03 ENCOUNTER — Other Ambulatory Visit (HOSPITAL_COMMUNITY): Payer: Self-pay | Admitting: Cardiology

## 2021-03-03 ENCOUNTER — Telehealth: Payer: Self-pay | Admitting: Cardiology

## 2021-03-03 ENCOUNTER — Other Ambulatory Visit: Payer: Self-pay

## 2021-03-03 ENCOUNTER — Other Ambulatory Visit: Payer: Self-pay | Admitting: Cardiovascular Disease

## 2021-03-03 DIAGNOSIS — R079 Chest pain, unspecified: Secondary | ICD-10-CM | POA: Insufficient documentation

## 2021-03-03 DIAGNOSIS — R072 Precordial pain: Secondary | ICD-10-CM | POA: Insufficient documentation

## 2021-03-03 DIAGNOSIS — R931 Abnormal findings on diagnostic imaging of heart and coronary circulation: Secondary | ICD-10-CM

## 2021-03-03 DIAGNOSIS — Z789 Other specified health status: Secondary | ICD-10-CM

## 2021-03-03 DIAGNOSIS — R9431 Abnormal electrocardiogram [ECG] [EKG]: Secondary | ICD-10-CM | POA: Diagnosis not present

## 2021-03-03 MED ORDER — IOHEXOL 350 MG/ML SOLN
95.0000 mL | Freq: Once | INTRAVENOUS | Status: AC | PRN
  Administered 2021-03-03: 95 mL via INTRAVENOUS

## 2021-03-03 MED ORDER — NITROGLYCERIN 0.4 MG SL SUBL
SUBLINGUAL_TABLET | SUBLINGUAL | Status: AC
  Filled 2021-03-03: qty 2

## 2021-03-03 MED ORDER — NITROGLYCERIN 0.4 MG SL SUBL
0.8000 mg | SUBLINGUAL_TABLET | Freq: Once | SUBLINGUAL | Status: AC
  Administered 2021-03-03: 0.8 mg via SUBLINGUAL

## 2021-03-03 NOTE — Progress Notes (Signed)
CT FFR ordered.   Emily Bells T. Audie Box, MD, [place]  [address], [place], [place] [address] [phone]  1:14 PM

## 2021-03-03 NOTE — Telephone Encounter (Signed)
Patient calling to find out if she can now take her medications since she had her CT this morning.

## 2021-03-03 NOTE — Telephone Encounter (Signed)
Called patient, discussed her hold certain medications until tomorrow. She was agreeable to the plan. No further questions at this time.

## 2021-03-04 ENCOUNTER — Other Ambulatory Visit: Payer: Self-pay

## 2021-03-04 DIAGNOSIS — R9431 Abnormal electrocardiogram [ECG] [EKG]: Secondary | ICD-10-CM | POA: Diagnosis not present

## 2021-03-04 DIAGNOSIS — R072 Precordial pain: Secondary | ICD-10-CM | POA: Diagnosis not present

## 2021-03-04 MED ORDER — ATORVASTATIN CALCIUM 20 MG PO TABS: 20.0000 mg | ORAL_TABLET | Freq: Every day | ORAL | 3 refills | Status: DC

## 2021-03-04 NOTE — Progress Notes (Signed)
Prescription sent to pharmacy.

## 2021-03-16 ENCOUNTER — Ambulatory Visit
Admission: RE | Admit: 2021-03-16 | Discharge: 2021-03-16 | Disposition: A | Discharge status: Home / Self Care | Payer: Medicare Other | Source: Ambulatory Visit | Attending: Family Medicine | Admitting: Family Medicine

## 2021-03-16 DIAGNOSIS — M7989 Other specified soft tissue disorders: Secondary | ICD-10-CM

## 2021-03-16 DIAGNOSIS — D1722 Benign lipomatous neoplasm of skin and subcutaneous tissue of left arm: Secondary | ICD-10-CM | POA: Diagnosis not present

## 2021-03-16 DIAGNOSIS — R221 Localized swelling, mass and lump, neck: Secondary | ICD-10-CM | POA: Diagnosis not present

## 2021-03-16 DIAGNOSIS — D171 Benign lipomatous neoplasm of skin and subcutaneous tissue of trunk: Secondary | ICD-10-CM | POA: Diagnosis not present

## 2021-03-16 DIAGNOSIS — R2232 Localized swelling, mass and lump, left upper limb: Secondary | ICD-10-CM | POA: Diagnosis not present

## 2021-03-16 MED ORDER — GADOBENATE DIMEGLUMINE 529 MG/ML IV SOLN
20.0000 mL | Freq: Once | INTRAVENOUS | Status: AC | PRN
  Administered 2021-03-16: 20 mL via INTRAVENOUS

## 2021-03-19 ENCOUNTER — Encounter: Payer: Self-pay | Admitting: Family Medicine

## 2021-04-04 ENCOUNTER — Other Ambulatory Visit: Payer: Self-pay | Admitting: Family Medicine

## 2021-04-04 DIAGNOSIS — M1711 Unilateral primary osteoarthritis, right knee: Secondary | ICD-10-CM | POA: Diagnosis not present

## 2021-04-09 ENCOUNTER — Other Ambulatory Visit: Payer: Self-pay | Admitting: Family Medicine

## 2021-04-09 DIAGNOSIS — E119 Type 2 diabetes mellitus without complications: Secondary | ICD-10-CM

## 2021-04-18 ENCOUNTER — Encounter: Payer: Self-pay | Admitting: Family Medicine

## 2021-04-18 DIAGNOSIS — E119 Type 2 diabetes mellitus without complications: Secondary | ICD-10-CM

## 2021-04-21 MED ORDER — GLUCOSE BLOOD VI STRP: ORAL_STRIP | 12 refills | Status: DC

## 2021-04-21 MED ORDER — ACCU-CHEK SOFTCLIX LANCETS MISC: 12 refills | Status: DC

## 2021-04-21 NOTE — Addendum Note (Signed)
Addended by: [name] C on: 04/21/2021 04:16 PM   Modules accepted: Orders

## 2021-04-28 ENCOUNTER — Other Ambulatory Visit: Payer: Self-pay | Admitting: *Deleted

## 2021-04-28 DIAGNOSIS — E119 Type 2 diabetes mellitus without complications: Secondary | ICD-10-CM

## 2021-04-28 MED ORDER — ACCU-CHEK GUIDE VI STRP: ORAL_STRIP | 1 refills | Status: DC

## 2021-04-28 MED ORDER — ACCU-CHEK SOFTCLIX LANCETS MISC: 12 refills | Status: DC

## 2021-05-08 DIAGNOSIS — N281 Cyst of kidney, acquired: Secondary | ICD-10-CM | POA: Diagnosis not present

## 2021-05-09 ENCOUNTER — Other Ambulatory Visit: Payer: Self-pay | Admitting: Family Medicine

## 2021-05-09 DIAGNOSIS — E119 Type 2 diabetes mellitus without complications: Secondary | ICD-10-CM

## 2021-05-12 ENCOUNTER — Other Ambulatory Visit: Payer: Self-pay | Admitting: Family Medicine

## 2021-05-12 DIAGNOSIS — I10 Essential (primary) hypertension: Secondary | ICD-10-CM

## 2021-05-13 ENCOUNTER — Other Ambulatory Visit: Payer: Self-pay | Admitting: Urology

## 2021-05-13 DIAGNOSIS — N281 Cyst of kidney, acquired: Secondary | ICD-10-CM

## 2021-05-15 ENCOUNTER — Encounter: Payer: Self-pay | Admitting: Cardiology

## 2021-05-15 ENCOUNTER — Other Ambulatory Visit: Payer: Self-pay

## 2021-05-15 ENCOUNTER — Ambulatory Visit: Payer: Medicare Other | Admitting: Cardiology

## 2021-05-15 VITALS — BP 132/74 | HR 64 | Ht 66.0 in | Wt 241.0 lb

## 2021-05-15 DIAGNOSIS — N183 Chronic kidney disease, stage 3 unspecified: Secondary | ICD-10-CM

## 2021-05-15 DIAGNOSIS — Z79899 Other long term (current) drug therapy: Secondary | ICD-10-CM | POA: Diagnosis not present

## 2021-05-15 DIAGNOSIS — I10 Essential (primary) hypertension: Secondary | ICD-10-CM | POA: Diagnosis not present

## 2021-05-15 DIAGNOSIS — E782 Mixed hyperlipidemia: Secondary | ICD-10-CM | POA: Diagnosis not present

## 2021-05-15 DIAGNOSIS — I361 Nonrheumatic tricuspid (valve) insufficiency: Secondary | ICD-10-CM | POA: Diagnosis not present

## 2021-05-15 DIAGNOSIS — I890 Lymphedema, not elsewhere classified: Secondary | ICD-10-CM | POA: Diagnosis not present

## 2021-05-15 DIAGNOSIS — I272 Pulmonary hypertension, unspecified: Secondary | ICD-10-CM | POA: Diagnosis not present

## 2021-05-15 DIAGNOSIS — I2510 Atherosclerotic heart disease of native coronary artery without angina pectoris: Secondary | ICD-10-CM | POA: Insufficient documentation

## 2021-05-15 DIAGNOSIS — I872 Venous insufficiency (chronic) (peripheral): Secondary | ICD-10-CM | POA: Diagnosis not present

## 2021-05-15 DIAGNOSIS — E669 Obesity, unspecified: Secondary | ICD-10-CM

## 2021-05-15 MED ORDER — ATORVASTATIN CALCIUM 40 MG PO TABS: 40.0000 mg | ORAL_TABLET | Freq: Every day | ORAL | 3 refills | Status: DC

## 2021-05-15 NOTE — Patient Instructions (Signed)
Medication Instructions:  Your physician has recommended you make the following change in your medication:  INCREASE: Lipitor 40 mg once daily *If you need a refill on your cardiac medications before your next appointment, please call your pharmacy*   Lab Work: Your physician recommends that you return for lab work in:  In 12 weeks: BMET, Mag, Lipids - please come fasting If you have labs (blood work) drawn today and your tests are completely normal, you will receive your results only by: [place] (if you have MyChart) OR A paper copy in the mail If you have any lab test that is abnormal or we need to change your treatment, we will call you to review the results.   Testing/Procedures: None   Follow-Up: At [facility], you and your health needs are our priority.  As part of our continuing mission to provide you with exceptional heart care, we have created designated Provider Care Teams.  These Care Teams include your primary Cardiologist (physician) and Advanced Practice Providers (APPs -  Physician Assistants and Nurse Practitioners) who all work together to provide you with the care you need, when you need it.  We recommend signing up for the patient portal called "MyChart".  Sign up information is provided on this After Visit Summary.  MyChart is used to connect with patients for Virtual Visits (Telemedicine).  Patients are able to view lab/test results, encounter notes, upcoming appointments, etc.  Non-urgent messages can be sent to your provider as well.   To learn more about what you can do with MyChart, go to [email or web address].    Your next appointment:   6 month(s)  The format for your next appointment:   In Person  Provider:   Berniece Salines, DO [address], [place], [place] [address]    Other Instructions Mediterranean Diet A Mediterranean diet refers to food and lifestyle choices that are based on the traditions of countries located on the  [place]. This way of eating has been shown to help prevent certain conditions and improve outcomes for people who have chronic diseases, like kidney disease and heart disease. What are tips for following this plan? Lifestyle Cook and eat meals together with your family, when possible. Drink enough fluid to keep your urine clear or pale yellow. Be physically active every day. This includes: Aerobic exercise like running or swimming. Leisure activities like gardening, walking, or housework. Get 7-8 hours of sleep each night. If recommended by your health care provider, drink red wine in moderation. This means 1 glass a day for nonpregnant women and 2 glasses a day for men. A glass of wine equals 5 oz (150 mL). Reading food labels  Check the serving size of packaged foods. For foods such as rice and pasta, the serving size refers to the amount of cooked product, not dry. Check the total fat in packaged foods. Avoid foods that have saturated fat or trans fats. Check the ingredients list for added sugars, such as corn syrup. Shopping At the grocery store, buy most of your food from the areas near the walls of the store. This includes: Fresh fruits and vegetables (produce). Grains, beans, nuts, and seeds. Some of these may be available in unpackaged forms or large amounts (in bulk). Fresh seafood. Poultry and eggs. Low-fat dairy products. Buy whole ingredients instead of prepackaged foods. Buy fresh fruits and vegetables in-season from local farmers markets. Buy frozen fruits and vegetables in resealable bags. If you do not have access to quality fresh  seafood, buy precooked frozen shrimp or canned fish, such as tuna, salmon, or sardines. Buy small amounts of raw or cooked vegetables, salads, or olives from the deli or salad bar at your store. Stock your pantry so you always have certain foods on hand, such as olive oil, canned tuna, canned tomatoes, rice, pasta, and  beans. Cooking Cook foods with extra-virgin olive oil instead of using butter or other vegetable oils. Have meat as a side dish, and have vegetables or grains as your main dish. This means having meat in small portions or adding small amounts of meat to foods like pasta or stew. Use beans or vegetables instead of meat in common dishes like chili or lasagna. Experiment with different cooking methods. Try roasting or broiling vegetables instead of steaming or sauteing them. Add frozen vegetables to soups, stews, pasta, or rice. Add nuts or seeds for added healthy fat at each meal. You can add these to yogurt, salads, or vegetable dishes. Marinate fish or vegetables using olive oil, lemon juice, garlic, and fresh herbs. Meal planning  Plan to eat 1 vegetarian meal one day each week. Try to work up to 2 vegetarian meals, if possible. Eat seafood 2 or more times a week. Have healthy snacks readily available, such as: Vegetable sticks with hummus. Greek yogurt. Fruit and nut trail mix. Eat balanced meals throughout the week. This includes: Fruit: 2-3 servings a day Vegetables: 4-5 servings a day Low-fat dairy: 2 servings a day Fish, poultry, or lean meat: 1 serving a day Beans and legumes: 2 or more servings a week Nuts and seeds: 1-2 servings a day Whole grains: 6-8 servings a day Extra-virgin olive oil: 3-4 servings a day Limit red meat and sweets to only a few servings a month What are my food choices? Mediterranean diet Recommended Grains: Whole-grain pasta. Brown rice. Bulgar wheat. Polenta. Couscous. Whole-wheat bread. [place]. Vegetables: Artichokes. Beets. Broccoli. Cabbage. Carrots. Eggplant. Green beans. Chard. Kale. Spinach. Onions. Leeks. Peas. Squash. Tomatoes. Peppers. Radishes. Fruits: Apples. Apricots. Avocado. Berries. Bananas. Cherries. Dates. Figs. Grapes. Lemons. Melon. Oranges. Peaches. Plums. Pomegranate. Meats and other protein foods: Beans. Almonds.  Sunflower seeds. Pine nuts. Peanuts. [place]. Salmon. Scallops. Shrimp. Spring. Tilapia. Clams. Oysters. Eggs. Dairy: Low-fat milk. Cheese. Greek yogurt. Beverages: Water. Red wine. Herbal tea. Fats and oils: Extra virgin olive oil. Avocado oil. Grape seed oil. Sweets and desserts: [place] yogurt with honey. Baked apples. Poached pears. Trail mix. Seasoning and other foods: Basil. Cilantro. Coriander. Cumin. Mint. Parsley. Sage. Rosemary. Tarragon. Garlic. Oregano. Thyme. Pepper. Balsalmic vinegar. Tahini. Hummus. Tomato sauce. Olives. Mushrooms. Limit these Grains: Prepackaged pasta or rice dishes. Prepackaged cereal with added sugar. Vegetables: Deep fried potatoes (french fries). Fruits: Fruit canned in syrup. Meats and other protein foods: Beef. Pork. Lamb. Poultry with skin. Hot dogs. [place] Sal[place]. Dairy: Ice cream. Sour cream. Whole milk. Beverages: Juice. Sugar-sweetened soft drinks. Beer. Liquor and spirits. Fats and oils: Butter. Canola oil. Vegetable oil. Beef fat (tallow). Lard. Sweets and desserts: Cookies. Cakes. Pies. Candy. Seasoning and other foods: Mayonnaise. Premade sauces and marinades. The items listed may not be a complete list. Talk with your dietitian about what dietary choices are right for you. Summary The Mediterranean diet includes both food and lifestyle choices. Eat a variety of fresh fruits and vegetables, beans, nuts, seeds, and whole grains. Limit the amount of red meat and sweets that you eat. Talk with your health care provider about whether it is safe for you to drink red wine in moderation.  This means 1 glass a day for nonpregnant women and 2 glasses a day for men. A glass of wine equals 5 oz (150 mL). This information is not intended to replace advice given to you by your health care provider. Make sure you discuss any questions you have with your health care provider. Document Revised: 03/05/2016 Document Reviewed: 02/27/2016 Elsevier Patient Education  [place].

## 2021-05-15 NOTE — Progress Notes (Signed)
Cardiology Office Note:    Date:  05/15/2021   ID:  Emily Wood, DOB 1951-02-17, MRN [id number]  PCP:  Emily Mclean, MD  Cardiologist:  Emily Salines, DO  Electrophysiologist:  None   Referring MD: Emily Mclean, MD   Chief Complaint  Patient presents with   Follow-up    12 weeks.   Edema    Ankles.    History of Present Illness:    Emily Wood is a 70 y.o. female with a hx of recently diagnosed coronary artery disease seen on her coronary CTA, mild to moderate tricuspid regurgitation, pulmonary hypertension, chronic kidney disease, Graves' disease, hyperlipidemia, type 2 diabetes.  I saw the patient on February 19, 2021 at that time she was experiencing chest discomfort as well as shortness of breath.  Given her risk factors I recommended patient undergo a coronary CTA as well as an echocardiogram. She was hypertensive at that time I stopped her metoprolol and started on carvedilol 12.5 mg twice daily.  During her amlodipine, clonidine, hydrochlorothiazide and irbesartan.  Since I saw her she has been able to do all of these testing as well as continue to take her blood pressure medication.  She is doing a lot better she tells me.  Past Medical History:  Diagnosis Date   Arthritis    Diabetes mellitus without complication ([place])    Graves disease 04/14/2020   Graves disease 07/20/2020   Graves disease    Hypertension    Mixed hyperlipidemia 02/20/2021    Past Surgical History:  Procedure Laterality Date   CHOLECYSTECTOMY     HERNIA REPAIR     OTHER SURGICAL HISTORY     scar tissue removal from bowels   PARTIAL HYSTERECTOMY     Still has ovaries   UTERINE FIBROID SURGERY      Current Medications: Current Meds  Medication Sig   Accu-Chek Softclix Lancets lancets Use to check blood sugar once day.  Dx code: E11.9   Alpha-Lipoic Acid 300 MG CAPS Take 2 capsules by mouth daily.   amLODipine (NORVASC) 5 MG tablet TAKE 1 AND 1/2 TABLETS BY  MOUTH  DAILY   aspirin 81 MG tablet Take 81 mg by mouth daily.   atorvastatin (LIPITOR) 40 MG tablet Take 1 tablet (40 mg total) by mouth daily.   carvedilol (COREG) 12.5 MG tablet Take 1 tablet (12.5 mg total) by mouth 2 (two) times daily.   cloNIDine (CATAPRES) 0.2 MG tablet TAKE 1 TABLET BY MOUTH 3  TIMES DAILY   ferrous sulfate 325 (65 FE) MG tablet Take 325 mg by mouth daily with breakfast.   fluticasone (FLONASE) 50 MCG/ACT nasal spray SPRAY 2 SPRAYS INTO EACH NOSTRIL EVERY DAY   gabapentin (NEURONTIN) 400 MG capsule TAKE 2 CAPSULES BY MOUTH 3  TIMES DAILY   glipiZIDE (GLUCOTROL) 5 MG tablet TAKE 1 TABLET BY MOUTH  DAILY BEFORE BREAKFAST   glucose blood (ACCU-CHEK GUIDE) test strip Use to check blood sugar once day.  Dx code: E11.9   hydrochlorothiazide (HYDRODIURIL) 25 MG tablet TAKE 1 TABLET BY MOUTH  DAILY   irbesartan (AVAPRO) 300 MG tablet TAKE 1 TABLET BY MOUTH  DAILY   metFORMIN (GLUCOPHAGE) 1000 MG tablet TAKE 1 TABLET BY MOUTH  TWICE DAILY WITH A MEAL   methimazole (TAPAZOLE) 5 MG tablet Take 2.5 mg by mouth daily.   Multiple Vitamin (MULTIVITAMIN ADULT PO) Take 1 tablet by mouth daily.   nystatin cream (MYCOSTATIN) Apply 1 application topically 2 (two)  times daily. (Patient taking differently: Apply 1 application topically 2 (two) times daily as needed.)   potassium chloride SA (KLOR-CON) 20 MEQ tablet TAKE 1 TABLET BY MOUTH  DAILY   TURMERIC PO Take 1 tablet by mouth daily.   UNABLE TO FIND daily. Med Name: Nyoka Cowden Lipped Muscle oil GLX3   vitamin C (ASCORBIC ACID) 500 MG tablet Take 500 mg by mouth daily.   [DISCONTINUED] atorvastatin (LIPITOR) 20 MG tablet Take 1 tablet (20 mg total) by mouth daily.     Allergies:   Patient has no known allergies.   Social History   Socioeconomic History   Marital status: Married    Spouse name: [name]   Number of children: 0   Years of education: 16   Highest education level: Not on file  Occupational History   Occupation: customer service   Tobacco Use   Smoking status: Never   Smokeless tobacco: Never  Vaping Use   Vaping Use: Never used  Substance and Sexual Activity   Alcohol use: No    Alcohol/week: 0.0 standard drinks    Comment: rarely   Drug use: No   Sexual activity: Not Currently    Birth control/protection: Post-menopausal  Other Topics Concern   Not on file  Social History Narrative   Lives with husband   Caffeine use: 16oz coffee per day   Social Determinants of Health   Financial Resource Strain: Not on file  Food Insecurity: Not on file  Transportation Needs: Not on file  Physical Activity: Not on file  Stress: Not on file  Social Connections: Not on file     Family History: The patient's family history includes Breast cancer in her cousin; Cancer in her sister; Diabetes in her maternal aunt and maternal uncle; Hyperlipidemia in her maternal aunt and mother.  ROS:   Review of Systems  Constitution: Negative for decreased appetite, fever and weight gain.  HENT: Negative for congestion, ear discharge, hoarse voice and sore throat.   Eyes: Negative for discharge, redness, vision loss in right eye and visual halos.  Cardiovascular: Negative for chest pain, dyspnea on exertion, leg swelling, orthopnea and palpitations.  Respiratory: Negative for cough, hemoptysis, shortness of breath and snoring.   Endocrine: Negative for heat intolerance and polyphagia.  Hematologic/Lymphatic: Negative for bleeding problem. Does not bruise/bleed easily.  Skin: Negative for flushing, nail changes, rash and suspicious lesions.  Musculoskeletal: Negative for arthritis, joint pain, muscle cramps, myalgias, neck pain and stiffness.  Gastrointestinal: Negative for abdominal pain, bowel incontinence, diarrhea and excessive appetite.  Genitourinary: Negative for decreased libido, genital sores and incomplete emptying.  Neurological: Negative for brief paralysis, focal weakness, headaches and loss of balance.   Psychiatric/Behavioral: Negative for altered mental status, depression and suicidal ideas.  Allergic/Immunologic: Negative for HIV exposure and persistent infections.    EKGs/Labs/Other Studies Reviewed:    The following studies were reviewed today:   EKG: None today  March 03, 2021 FINDINGS: Image quality: Poor quality study due to early trigger of the contrast bolus. The contrast bolus is largely timed for the right ventricular with significant SVC enhancement.   Noise artifact is: Moderate signal to noise artifact due to poor contrast bolus timing.   Coronary Arteries:  Normal coronary origin.  Right dominance.   Left main: The left main is a large caliber vessel with a normal take off from the left coronary cusp that bifurcates to form a left anterior descending artery and a left circumflex artery.   Left anterior  descending artery: The proximal LAD contains minimal calcified plaque (<25%). The mid and distal segments appear patent. The first diagonal branch contains minimal calcified plaque (<25%).   Left circumflex artery: The LCX is non-dominant and patent with no evidence of plaque or stenosis. The LCX gives off 3 patent obtuse marginal branches.   Right coronary artery: The RCA is dominant with normal take off from the right coronary cusp. The ostial RCA contains mild mixed density plaque (25-49%). The proximal segment contains minimal calcified plaque (<25%). The mid segment is patent. The distal RCA contains minimal calcified plaque (<25%). The RCA terminates as a PDA and right posterolateral branch without evidence of plaque or stenosis.   Right Atrium: Right atrial size is within normal limits.   Right Ventricle: The right ventricular cavity is within normal limits.   Left Atrium: Left atrial size is normal in size with no left atrial appendage filling defect.   Left Ventricle: The ventricular cavity size is within normal limits. There are no stigmata  of prior infarction. There is no abnormal filling defect.   Pulmonary arteries: Normal in size without proximal filling defect.   Pulmonary veins: Normal pulmonary venous drainage.   Pericardium: Normal thickness with no significant effusion or calcium present.   Cardiac valves: The aortic valve is trileaflet without significant calcification. The mitral valve is normal structure with minimal annular calcium.   Aorta: Normal caliber with no significant disease.   Extra-cardiac findings: See attached radiology report for non-cardiac structures.   IMPRESSION: 1. Poor quality study due to early trigger of the contrast bolus. The contrast bolus is largely timed for the right ventricular with significant SVC enhancement. Study remains interpretable.   2. Coronary calcium score of 418. This was 93rd percentile for age-, sex, and race-matched controls.   3. Normal coronary origin with right dominance.   4. Mild mixed density plaque (25-49%) in the ostial RCA.   5. Minimal calcified plaque (<25%) in the proximal LAD.   RECOMMENDATIONS: 1. Mild non-obstructive CAD (25-49%) in the ostial RCA. CT FFR will be sent due to ostial location. Consider preventive therapy and risk factor modification.   Eleonore Chiquito, MD  Echo August 2022 FINDINGS   Left Ventricle: Left ventricular ejection fraction, by estimation, is 60  to 65%. The left ventricle has normal function. The left ventricle has no  regional wall motion abnormalities. Definity contrast agent was given IV  to delineate the left ventricular   endocardial borders. The left ventricular internal cavity size was normal  in size. There is no left ventricular hypertrophy. Left ventricular  diastolic parameters are consistent with Grade II diastolic dysfunction  (pseudonormalization). Elevated left  atrial pressure.   Right Ventricle: The right ventricular size is normal. No increase in  right ventricular wall thickness. Right  ventricular systolic function is  normal. There is moderately elevated pulmonary artery systolic pressure.  The tricuspid regurgitant velocity is  3.35 m/s, and with an assumed right atrial pressure of 8 mmHg, the  estimated right ventricular systolic pressure is 37.1 mmHg.   Left Atrium: Left atrial size was mildly dilated.   Right Atrium: Right atrial size was normal in size.   Pericardium: There is no evidence of pericardial effusion.   Mitral Valve: The mitral valve is normal in structure. Trivial mitral  valve regurgitation. No evidence of mitral valve stenosis.   Tricuspid Valve: The tricuspid valve is normal in structure. Tricuspid  valve regurgitation is mild to moderate. No evidence of tricuspid  stenosis.   Aortic Valve: The aortic valve is normal in structure. Aortic valve  regurgitation is not visualized. No aortic stenosis is present.   Pulmonic Valve: The pulmonic valve was normal in structure. Pulmonic valve  regurgitation is not visualized. No evidence of pulmonic stenosis.   Aorta: The aortic root is normal in size and structure.   Venous: The inferior vena cava is normal in size with greater than 50%  respiratory variability, suggesting right atrial pressure of 3 mmHg.   IAS/Shunts: No atrial level shunt detected by color flow Doppler.         Recent Labs: 07/04/2020: ALT 11; Hemoglobin 10.3; Platelets 426.0 01/17/2021: TSH 1.12 02/24/2021: BUN 19; Creatinine, Ser 1.11; Magnesium 1.6; Potassium 4.1; Sodium 140  Recent Lipid Panel    Component Value Date/Time   CHOL 185 01/17/2021 0000   TRIG 63 01/17/2021 0000   HDL 65 01/17/2021 0000   CHOLHDL 4 07/06/2019 0942   VLDL 25.8 07/06/2019 0942   LDLCALC 108 01/17/2021 0000    Physical Exam:    VS:  BP 132/74 (BP Location: Left Arm, Patient Position: Sitting, Cuff Size: Large)   Pulse 64   Ht 5\' 6"  (1.676 m)   Wt 241 lb (109.3 kg)   BMI 38.90 kg/m     Wt Readings from Last 3 Encounters:  05/15/21  241 lb (109.3 kg)  02/19/21 237 lb 0.6 oz (107.5 kg)  02/13/21 239 lb 9.6 oz (108.7 kg)     GEN: Well nourished, well developed in no acute distress HEENT: Normal NECK: No JVD; No carotid bruits LYMPHATICS: No lymphadenopathy CARDIAC: S1S2 noted,RRR, no murmurs, rubs, gallops RESPIRATORY:  Clear to auscultation without rales, wheezing or rhonchi  ABDOMEN: Soft, non-tender, non-distended, +bowel sounds, no guarding. EXTREMITIES: No edema, No cyanosis, no clubbing MUSCULOSKELETAL:  No deformity  SKIN: Warm and dry NEUROLOGIC:  Alert and oriented x 3, non-focal PSYCHIATRIC:  Normal affect, good insight  ASSESSMENT:    1. Coronary artery disease involving native coronary artery of native heart without angina pectoris   2. Hypertension, unspecified type   3. Nonrheumatic tricuspid valve regurgitation   4. Mixed hyperlipidemia   5. Medication management   6. Primary hypertension   7. Obesity (BMI 30-39.9)   8. Pulmonary hypertension, unspecified ([place])   9. Lymphedema   10. Chronic venous insufficiency   11. Stage 3 chronic kidney disease, unspecified whether stage 3a or 3b CKD ([place])    PLAN:     We discussed all of her testing results.  Explained to the patient that she does have coronary artery disease though mild.  Right now the plan moving forward is medical therapy she will continue her aspirin 81 mg daily, I am going to increase her atorvastatin to high intensity statin which will now be Lipitor 40 mg daily.  In July her LDL was 108 her goal is to be less than 70 and ideally less than 55.  I explained this to this patient.  She expresses understanding.  She also will work on her diet.  Her blood pressure is acceptable on her current regimen no changes will be made there.  I also explained to the patient about her echocardiogram results.  No questions at this time.  Her Lipitor is increased to 40 mg daily-blood work in 12 weeks.  Diabetes mellitus-this is being managed by her  primary care provider.  The patient understands the need to lose weight with diet and exercise. We have discussed specific strategies for  this.  The patient is in agreement with the above plan. The patient left the office in stable condition.  The patient will follow up in 6 months or sooner if needed.   Medication Adjustments/Labs and Tests Ordered: Current medicines are reviewed at length with the patient today.  Concerns regarding medicines are outlined above.  Orders Placed This Encounter  Procedures   Basic metabolic panel   Magnesium   Lipid panel   Meds ordered this encounter  Medications   atorvastatin (LIPITOR) 40 MG tablet    Sig: Take 1 tablet (40 mg total) by mouth daily.    Dispense:  90 tablet    Refill:  3    Patient Instructions  Medication Instructions:  Your physician has recommended you make the following change in your medication:  INCREASE: Lipitor 40 mg once daily *If you need a refill on your cardiac medications before your next appointment, please call your pharmacy*   Lab Work: Your physician recommends that you return for lab work in:  In 12 weeks: BMET, Mag, Lipids - please come fasting If you have labs (blood work) drawn today and your tests are completely normal, you will receive your results only by: [place] (if you have MyChart) OR A paper copy in the mail If you have any lab test that is abnormal or we need to change your treatment, we will call you to review the results.   Testing/Procedures: None   Follow-Up: At [facility], you and your health needs are our priority.  As part of our continuing mission to provide you with exceptional heart care, we have created designated Provider Care Teams.  These Care Teams include your primary Cardiologist (physician) and Advanced Practice Providers (APPs -  Physician Assistants and Nurse Practitioners) who all work together to provide you with the care you need, when you need it.  We  recommend signing up for the patient portal called "MyChart".  Sign up information is provided on this After Visit Summary.  MyChart is used to connect with patients for Virtual Visits (Telemedicine).  Patients are able to view lab/test results, encounter notes, upcoming appointments, etc.  Non-urgent messages can be sent to your provider as well.   To learn more about what you can do with MyChart, go to [email or web address].    Your next appointment:   6 month(s)  The format for your next appointment:   In Person  Provider:   Berniece Salines, DO [id number], [place], [place] [address]    Other Instructions Mediterranean Diet A Mediterranean diet refers to food and lifestyle choices that are based on the traditions of countries located on the [place]. This way of eating has been shown to help prevent certain conditions and improve outcomes for people who have chronic diseases, like kidney disease and heart disease. What are tips for following this plan? Lifestyle Cook and eat meals together with your family, when possible. Drink enough fluid to keep your urine clear or pale yellow. Be physically active every day. This includes: Aerobic exercise like running or swimming. Leisure activities like gardening, walking, or housework. Get 7-8 hours of sleep each night. If recommended by your health care provider, drink red wine in moderation. This means 1 glass a day for nonpregnant women and 2 glasses a day for men. A glass of wine equals 5 oz (150 mL). Reading food labels  Check the serving size of packaged foods. For foods such as rice and pasta, the serving size refers  to the amount of cooked product, not dry. Check the total fat in packaged foods. Avoid foods that have saturated fat or trans fats. Check the ingredients list for added sugars, such as corn syrup. Shopping At the grocery store, buy most of your food from the areas near the walls of the store. This  includes: Fresh fruits and vegetables (produce). Grains, beans, nuts, and seeds. Some of these may be available in unpackaged forms or large amounts (in bulk). Fresh seafood. Poultry and eggs. Low-fat dairy products. Buy whole ingredients instead of prepackaged foods. Buy fresh fruits and vegetables in-season from local farmers markets. Buy frozen fruits and vegetables in resealable bags. If you do not have access to quality fresh seafood, buy precooked frozen shrimp or canned fish, such as tuna, salmon, or sardines. Buy small amounts of raw or cooked vegetables, salads, or olives from the deli or salad bar at your store. Stock your pantry so you always have certain foods on hand, such as olive oil, canned tuna, canned tomatoes, rice, pasta, and beans. Cooking Cook foods with extra-virgin olive oil instead of using butter or other vegetable oils. Have meat as a side dish, and have vegetables or grains as your main dish. This means having meat in small portions or adding small amounts of meat to foods like pasta or stew. Use beans or vegetables instead of meat in common dishes like chili or lasagna. Experiment with different cooking methods. Try roasting or broiling vegetables instead of steaming or sauteing them. Add frozen vegetables to soups, stews, pasta, or rice. Add nuts or seeds for added healthy fat at each meal. You can add these to yogurt, salads, or vegetable dishes. Marinate fish or vegetables using olive oil, lemon juice, garlic, and fresh herbs. Meal planning  Plan to eat 1 vegetarian meal one day each week. Try to work up to 2 vegetarian meals, if possible. Eat seafood 2 or more times a week. Have healthy snacks readily available, such as: Vegetable sticks with hummus. Greek yogurt. Fruit and nut trail mix. Eat balanced meals throughout the week. This includes: Fruit: 2-3 servings a day Vegetables: 4-5 servings a day Low-fat dairy: 2 servings a day Fish, poultry, or  lean meat: 1 serving a day Beans and legumes: 2 or more servings a week Nuts and seeds: 1-2 servings a day Whole grains: 6-8 servings a day Extra-virgin olive oil: 3-4 servings a day Limit red meat and sweets to only a few servings a month What are my food choices? Mediterranean diet Recommended Grains: Whole-grain pasta. Brown rice. Bulgar wheat. Polenta. Couscous. Whole-wheat bread. [place]. Vegetables: Artichokes. Beets. Broccoli. Cabbage. Carrots. Eggplant. Green beans. Chard. Kale. Spinach. Onions. Leeks. Peas. Squash. Tomatoes. Peppers. Radishes. Fruits: Apples. Apricots. Avocado. Berries. Bananas. Cherries. Dates. Figs. Grapes. Lemons. Melon. Oranges. Peaches. Plums. Pomegranate. Meats and other protein foods: Beans. Almonds. Sunflower seeds. Pine nuts. Peanuts. [place]. Salmon. Scallops. Shrimp. [place]. Tilapia. Clams. Oysters. Eggs. Dairy: Low-fat milk. Cheese. Greek yogurt. Beverages: Water. Red wine. Herbal tea. Fats and oils: Extra virgin olive oil. Avocado oil. Grape seed oil. Sweets and desserts: [place] yogurt with honey. Baked apples. Poached pears. Trail mix. Seasoning and other foods: Basil. Cilantro. Coriander. Cumin. Mint. Parsley. Sage. Rosemary. Tarragon. Garlic. Oregano. Thyme. Pepper. Balsalmic vinegar. Tahini. Hummus. Tomato sauce. Olives. Mushrooms. Limit these Grains: Prepackaged pasta or rice dishes. Prepackaged cereal with added sugar. Vegetables: Deep fried potatoes (french fries). Fruits: Fruit canned in syrup. Meats and other protein foods: Beef. Pork. Lamb. Poultry with skin.  Hot dogs. Emily Wood. Dairy: Ice cream. Sour cream. Whole milk. Beverages: Juice. Sugar-sweetened soft drinks. Beer. Liquor and spirits. Fats and oils: Butter. Canola oil. Vegetable oil. Beef fat (tallow). Lard. Sweets and desserts: Cookies. Cakes. Pies. Candy. Seasoning and other foods: Mayonnaise. Premade sauces and marinades. The items listed may not be a complete list. Talk with your  dietitian about what dietary choices are right for you. Summary The Mediterranean diet includes both food and lifestyle choices. Eat a variety of fresh fruits and vegetables, beans, nuts, seeds, and whole grains. Limit the amount of red meat and sweets that you eat. Talk with your health care provider about whether it is safe for you to drink red wine in moderation. This means 1 glass a day for nonpregnant women and 2 glasses a day for men. A glass of wine equals 5 oz (150 mL). This information is not intended to replace advice given to you by your health care provider. Make sure you discuss any questions you have with your health care provider. Document Revised: 03/05/2016 Document Reviewed: 02/27/2016 Elsevier Patient Education  [place].     Adopting a Healthy Lifestyle.  Know what a healthy weight is for you (roughly BMI <25) and aim to maintain this   Aim for 7+ servings of fruits and vegetables daily   65-80+ fluid ounces of water or unsweet tea for healthy kidneys   Limit to max 1 drink of alcohol per day; avoid smoking/tobacco   Limit animal fats in diet for cholesterol and heart health - choose grass fed whenever available   Avoid highly processed foods, and foods high in saturated/trans fats   Aim for low stress - take time to unwind and care for your mental health   Aim for 150 min of moderate intensity exercise weekly for heart health, and weights twice weekly for bone health   Aim for 7-9 hours of sleep daily   When it comes to diets, agreement about the perfect plan isnt easy to find, even among the experts. Experts at the [employer] developed an idea known as the Healthy Eating Plate. Just imagine a plate divided into logical, healthy portions.   The emphasis is on diet quality:   Load up on vegetables and fruits - one-half of your plate: Aim for color and variety, and remember that potatoes dont count.   Go for whole grains -  one-quarter of your plate: Whole wheat, barley, wheat berries, quinoa, oats, brown rice, and foods made with them. If you want pasta, go with whole wheat pasta.   Protein power - one-quarter of your plate: Fish, chicken, beans, and nuts are all healthy, versatile protein sources. Limit red meat.   The diet, however, does go beyond the plate, offering a few other suggestions.   Use healthy plant oils, such as olive, canola, soy, corn, sunflower and peanut. Check the labels, and avoid partially hydrogenated oil, which have unhealthy trans fats.   If youre thirsty, drink water. Coffee and tea are good in moderation, but skip sugary drinks and limit milk and dairy products to one or two daily servings.   The type of carbohydrate in the diet is more important than the amount. Some sources of carbohydrates, such as vegetables, fruits, whole grains, and beans-are healthier than others.   Finally, stay active  Signed, Emily Salines, DO  05/15/2021 9:02 AM    [place]

## 2021-05-26 ENCOUNTER — Telehealth: Payer: Self-pay | Admitting: Family Medicine

## 2021-05-26 NOTE — Telephone Encounter (Signed)
Left message for patient to call back and schedule Medicare Annual Wellness Visit (AWV) in office.   If not able to come in office, please offer to do virtually or by telephone.  Left office number and my jabber [phone].  Last AWV:10/30/2019  Please schedule at anytime with Nurse Health Advisor.

## 2021-05-28 DIAGNOSIS — E0500 Thyrotoxicosis with diffuse goiter without thyrotoxic crisis or storm: Secondary | ICD-10-CM | POA: Diagnosis not present

## 2021-05-28 DIAGNOSIS — E119 Type 2 diabetes mellitus without complications: Secondary | ICD-10-CM | POA: Diagnosis not present

## 2021-06-04 DIAGNOSIS — E119 Type 2 diabetes mellitus without complications: Secondary | ICD-10-CM | POA: Diagnosis not present

## 2021-06-04 DIAGNOSIS — E0500 Thyrotoxicosis with diffuse goiter without thyrotoxic crisis or storm: Secondary | ICD-10-CM | POA: Diagnosis not present

## 2021-06-06 ENCOUNTER — Other Ambulatory Visit: Payer: Self-pay

## 2021-06-06 ENCOUNTER — Ambulatory Visit (INDEPENDENT_AMBULATORY_CARE_PROVIDER_SITE_OTHER): Payer: Medicare Other

## 2021-06-06 ENCOUNTER — Ambulatory Visit: Payer: Medicare Other | Attending: Internal Medicine

## 2021-06-06 VITALS — BP 155/83 | HR 63 | Temp 98.6°F | Resp 16 | Ht 65.0 in | Wt 238.4 lb

## 2021-06-06 DIAGNOSIS — Z0000 Encounter for general adult medical examination without abnormal findings: Secondary | ICD-10-CM

## 2021-06-06 DIAGNOSIS — Z23 Encounter for immunization: Secondary | ICD-10-CM | POA: Diagnosis not present

## 2021-06-06 NOTE — Patient Instructions (Signed)
[name] , Thank you for taking time to come for your Medicare Wellness Visit. I appreciate your ongoing commitment to your health goals. Please review the following plan we discussed and let me know if I can assist you in the future.   Screening recommendations/referrals: Colonoscopy: Cologuard completed 07/19/2019-Due 07/18/2022 Mammogram: Completed 07/30/2020-Due 07/30/2021 Bone Density: Completed 08/28/2020-Due 08/28/2022 Recommended yearly ophthalmology/optometry visit for glaucoma screening and checkup Recommended yearly dental visit for hygiene and checkup  Vaccinations: Influenza vaccine: Up to date Pneumococcal vaccine: Up to date Tdap vaccine: Up to date Shingles vaccine: Discuss with pharmacy   Covid-19:Booster available at the pharmacy  Advanced directives: Information given today  Conditions/risks identified: See problem list  Next appointment: Follow up in one year for your annual wellness visit    Preventive Care 65 Years and Older, Female Preventive care refers to lifestyle choices and visits with your health care provider that can promote health and wellness. What does preventive care include? A yearly physical exam. This is also called an annual well check. Dental exams once or twice a year. Routine eye exams. Ask your health care provider how often you should have your eyes checked. Personal lifestyle choices, including: Daily care of your teeth and gums. Regular physical activity. Eating a healthy diet. Avoiding tobacco and drug use. Limiting alcohol use. Practicing safe sex. Taking low-dose aspirin every day. Taking vitamin and mineral supplements as recommended by your health care provider. What happens during an annual well check? The services and screenings done by your health care provider during your annual well check will depend on your age, overall health, lifestyle risk factors, and family history of disease. Counseling  Your health care provider  may ask you questions about your: Alcohol use. Tobacco use. Drug use. Emotional well-being. Home and relationship well-being. Sexual activity. Eating habits. History of falls. Memory and ability to understand (cognition). Work and work Statistician. Reproductive health. Screening  You may have the following tests or measurements: Height, weight, and BMI. Blood pressure. Lipid and cholesterol levels. These may be checked every 5 years, or more frequently if you are over 29 years old. Skin check. Lung cancer screening. You may have this screening every year starting at age 55 if you have a 30-pack-year history of smoking and currently smoke or have quit within the past 15 years. Fecal occult blood test (FOBT) of the stool. You may have this test every year starting at age 8. Flexible sigmoidoscopy or colonoscopy. You may have a sigmoidoscopy every 5 years or a colonoscopy every 10 years starting at age 84. Hepatitis C blood test. Hepatitis B blood test. Sexually transmitted disease (STD) testing. Diabetes screening. This is done by checking your blood sugar (glucose) after you have not eaten for a while (fasting). You may have this done every 1-3 years. Bone density scan. This is done to screen for osteoporosis. You may have this done starting at age 10. Mammogram. This may be done every 1-2 years. Talk to your health care provider about how often you should have regular mammograms. Talk with your health care provider about your test results, treatment options, and if necessary, the need for more tests. Vaccines  Your health care provider may recommend certain vaccines, such as: Influenza vaccine. This is recommended every year. Tetanus, diphtheria, and acellular pertussis (Tdap, Td) vaccine. You may need a Td booster every 10 years. Zoster vaccine. You may need this after age 63. Pneumococcal 13-valent conjugate (PCV13) vaccine. One dose is recommended after age 86.  Pneumococcal  polysaccharide (PPSV23) vaccine. One dose is recommended after age 52. Talk to your health care provider about which screenings and vaccines you need and how often you need them. This information is not intended to replace advice given to you by your health care provider. Make sure you discuss any questions you have with your health care provider. Document Released: 08/02/2015 Document Revised: 03/25/2016 Document Reviewed: 05/07/2015 Elsevier Interactive Patient Education  2017 [employer] Prevention in the Home Falls can cause injuries. They can happen to people of all ages. There are many things you can do to make your home safe and to help prevent falls. What can I do on the outside of my home? Regularly fix the edges of walkways and driveways and fix any cracks. Remove anything that might make you trip as you walk through a door, such as a raised step or threshold. Trim any bushes or trees on the path to your home. Use bright outdoor lighting. Clear any walking paths of anything that might make someone trip, such as rocks or tools. Regularly check to see if handrails are loose or broken. Make sure that both sides of any steps have handrails. Any raised decks and porches should have guardrails on the edges. Have any leaves, snow, or ice cleared regularly. Use sand or salt on walking paths during winter. Clean up any spills in your garage right away. This includes oil or grease spills. What can I do in the bathroom? Use night lights. Install grab bars by the toilet and in the tub and shower. Do not use towel bars as grab bars. Use non-skid mats or decals in the tub or shower. If you need to sit down in the shower, use a plastic, non-slip stool. Keep the floor dry. Clean up any water that spills on the floor as soon as it happens. Remove soap buildup in the tub or shower regularly. Attach bath mats securely with double-sided non-slip rug tape. Do not have throw rugs and other  things on the floor that can make you trip. What can I do in the bedroom? Use night lights. Make sure that you have a light by your bed that is easy to reach. Do not use any sheets or blankets that are too big for your bed. They should not hang down onto the floor. Have a firm chair that has side arms. You can use this for support while you get dressed. Do not have throw rugs and other things on the floor that can make you trip. What can I do in the kitchen? Clean up any spills right away. Avoid walking on wet floors. Keep items that you use a lot in easy-to-reach places. If you need to reach something above you, use a strong step stool that has a grab bar. Keep electrical cords out of the way. Do not use floor polish or wax that makes floors slippery. If you must use wax, use non-skid floor wax. Do not have throw rugs and other things on the floor that can make you trip. What can I do with my stairs? Do not leave any items on the stairs. Make sure that there are handrails on both sides of the stairs and use them. Fix handrails that are broken or loose. Make sure that handrails are as long as the stairways. Check any carpeting to make sure that it is firmly attached to the stairs. Fix any carpet that is loose or worn. Avoid having throw rugs at the  top or bottom of the stairs. If you do have throw rugs, attach them to the floor with carpet tape. Make sure that you have a light switch at the top of the stairs and the bottom of the stairs. If you do not have them, ask someone to add them for you. What else can I do to help prevent falls? Wear shoes that: Do not have high heels. Have rubber bottoms. Are comfortable and fit you well. Are closed at the toe. Do not wear sandals. If you use a stepladder: Make sure that it is fully opened. Do not climb a closed stepladder. Make sure that both sides of the stepladder are locked into place. Ask someone to hold it for you, if possible. Clearly  mark and make sure that you can see: Any grab bars or handrails. First and last steps. Where the edge of each step is. Use tools that help you move around (mobility aids) if they are needed. These include: Canes. Walkers. Scooters. Crutches. Turn on the lights when you go into a dark area. Replace any light bulbs as soon as they burn out. Set up your furniture so you have a clear path. Avoid moving your furniture around. If any of your floors are uneven, fix them. If there are any pets around you, be aware of where they are. Review your medicines with your doctor. Some medicines can make you feel dizzy. This can increase your chance of falling. Ask your doctor what other things that you can do to help prevent falls. This information is not intended to replace advice given to you by your health care provider. Make sure you discuss any questions you have with your health care provider. Document Released: 05/02/2009 Document Revised: 12/12/2015 Document Reviewed: 08/10/2014 Elsevier Interactive Patient Education  2017 [employer].

## 2021-06-06 NOTE — Progress Notes (Signed)
Subjective:   Emily Wood is a 70 y.o. female who presents for Medicare Annual (Subsequent) preventive examination.    Review of Systems     Cardiac Risk Factors include: advanced age (>48men, >33 women);hypertension;diabetes mellitus;dyslipidemia;obesity (BMI >30kg/m2);sedentary lifestyle     Objective:    Today's Vitals   06/06/21 0818  BP: (!) 155/83  Pulse: 63  Resp: 16  Temp: 98.6 F (37 C)  TempSrc: Oral  SpO2: 95%  Weight: 238 lb 6.4 oz (108.1 kg)  Height: 5\' 5"  (1.651 m)   Body mass index is 39.67 kg/m.  Advanced Directives 06/06/2021 11/12/2020 10/30/2019 11/30/2016 06/28/2015  Does Patient Have a Medical Advance Directive? No No No Yes No  Type of Advance Directive - - Public librarian -  Would patient like information on creating a medical advance directive? Yes (MAU/Ambulatory/Procedural Areas - Information given) No - Patient declined No - Patient declined - -    Current Medications (verified) Outpatient Encounter Medications as of 06/06/2021  Medication Sig   Accu-Chek Softclix Lancets lancets Use to check blood sugar once day.  Dx code: E11.9   Alpha-Lipoic Acid 300 MG CAPS Take 2 capsules by mouth daily.   amLODipine (NORVASC) 5 MG tablet TAKE 1 AND 1/2 TABLETS BY  MOUTH DAILY   aspirin 81 MG tablet Take 81 mg by mouth daily.   atorvastatin (LIPITOR) 40 MG tablet Take 1 tablet (40 mg total) by mouth daily.   cloNIDine (CATAPRES) 0.2 MG tablet TAKE 1 TABLET BY MOUTH 3  TIMES DAILY   ferrous sulfate 325 (65 FE) MG tablet Take 325 mg by mouth daily with breakfast.   fluticasone (FLONASE) 50 MCG/ACT nasal spray SPRAY 2 SPRAYS INTO EACH NOSTRIL EVERY DAY   gabapentin (NEURONTIN) 400 MG capsule TAKE 2 CAPSULES BY MOUTH 3  TIMES DAILY   glipiZIDE (GLUCOTROL) 5 MG tablet TAKE 1 TABLET BY MOUTH  DAILY BEFORE BREAKFAST   glucose blood (ACCU-CHEK GUIDE) test strip Use to check blood sugar once day.  Dx code: E11.9   hydrochlorothiazide  (HYDRODIURIL) 25 MG tablet TAKE 1 TABLET BY MOUTH  DAILY   irbesartan (AVAPRO) 300 MG tablet TAKE 1 TABLET BY MOUTH  DAILY   metFORMIN (GLUCOPHAGE) 1000 MG tablet TAKE 1 TABLET BY MOUTH  TWICE DAILY WITH A MEAL   methimazole (TAPAZOLE) 5 MG tablet Take 2.5 mg by mouth daily.   Multiple Vitamin (MULTIVITAMIN ADULT PO) Take 1 tablet by mouth daily.   nystatin cream (MYCOSTATIN) Apply 1 application topically 2 (two) times daily. (Patient taking differently: Apply 1 application topically 2 (two) times daily as needed.)   potassium chloride SA (KLOR-CON) 20 MEQ tablet TAKE 1 TABLET BY MOUTH  DAILY   TURMERIC PO Take 1 tablet by mouth daily.   UNABLE TO FIND daily. Med Name: Nyoka Cowden Lipped Muscle oil GLX3   vitamin C (ASCORBIC ACID) 500 MG tablet Take 500 mg by mouth daily.   carvedilol (COREG) 12.5 MG tablet Take 1 tablet (12.5 mg total) by mouth 2 (two) times daily.   No facility-administered encounter medications on file as of 06/06/2021.    Allergies (verified) Patient has no known allergies.   History: Past Medical History:  Diagnosis Date   Arthritis    Diabetes mellitus without complication ([place])    Graves disease 04/14/2020   Graves disease 07/20/2020   Graves disease    Hypertension    Mixed hyperlipidemia 02/20/2021   Past Surgical History:  Procedure Laterality Date  CHOLECYSTECTOMY     HERNIA REPAIR     OTHER SURGICAL HISTORY     scar tissue removal from bowels   PARTIAL HYSTERECTOMY     Still has ovaries   UTERINE FIBROID SURGERY     Family History  Problem Relation Age of Onset   Hyperlipidemia Mother    Cancer Sister    Hyperlipidemia Maternal Aunt    Diabetes Maternal Aunt    Breast cancer Cousin    Diabetes Maternal Uncle    Social History   Socioeconomic History   Marital status: Married    Spouse name: [name]   Number of children: 0   Years of education: 16   Highest education level: Not on file  Occupational History   Occupation: customer service   Tobacco Use   Smoking status: Never   Smokeless tobacco: Never  Vaping Use   Vaping Use: Never used  Substance and Sexual Activity   Alcohol use: No    Alcohol/week: 0.0 standard drinks    Comment: rarely   Drug use: No   Sexual activity: Not Currently    Birth control/protection: Post-menopausal  Other Topics Concern   Not on file  Social History Narrative   Lives with husband   Caffeine use: 16oz coffee per day   Social Determinants of Health   Financial Resource Strain: Medium Risk   Difficulty of Paying Living Expenses: Somewhat hard  Food Insecurity: Food Insecurity Present   Worried About Charity fundraiser in the Last Year: Sometimes true   Arboriculturist in the Last Year: Never true  Transportation Needs: Unmet Transportation Needs   Lack of Transportation (Medical): Yes   Lack of Transportation (Non-Medical): No  Physical Activity: Inactive   Days of Exercise per Week: 0 days   Minutes of Exercise per Session: 0 min  Stress: No Stress Concern Present   Feeling of Stress : Not at all  Social Connections: Not on file    Tobacco Counseling Counseling given: Not Answered   Clinical Intake:  Pre-visit preparation completed: Yes        BMI - recorded: 39.67 Nutritional Status: BMI > 30  Obese Nutritional Risks: None Diabetes: Yes CBG done?: No Did pt. bring in CBG monitor from home?: No  How often do you need to have someone help you when you read instructions, pamphlets, or other written materials from your doctor or pharmacy?: 1 - Never  Diabetes:  Is the patient diabetic?  Yes  If diabetic, was a CBG obtained today?  No  Did the patient bring in their glucometer from home?  No  How often do you monitor your CBG's? occasionally  Financial Strains and Diabetes Management:  Are you having any financial strains with the device, your supplies or your medication? No .  Does the patient want to be seen by Chronic Care Management for management of  their diabetes?  No  Would the patient like to be referred to a Nutritionist or for Diabetic Management?  No   Diabetic Exams:  Diabetic Eye Exam: Completed 08/27/2020.   Diabetic Foot Exam: Completed 07/24/2020.    Interpreter Needed?: No  Information entered by :: Caroleen Hamman LPN   Activities of Daily Living In your present state of health, do you have any difficulty performing the following activities: 06/06/2021  Hearing? N  Vision? N  Difficulty concentrating or making decisions? N  Walking or climbing stairs? N  Dressing or bathing? N  Doing errands, shopping? N  Preparing Food and eating ? N  Using the Toilet? N  In the past six months, have you accidently leaked urine? Y  Comment sees urologist  Do you have problems with loss of bowel control? N  Managing your Medications? N  Managing your Finances? N  Housekeeping or managing your Housekeeping? N  Some recent data might be hidden    Patient Care Team: Copland, Gay Filler, MD as PCP - General (Family Medicine) Berniece Salines, DO as PCP - Cardiology (Cardiology)  Indicate any recent Medical Services you may have received from other than [facility] providers in the past year (date may be approximate).     Assessment:   This is a routine wellness examination for Evaleen.  Hearing/Vision screen Hearing Screening - Comments:: No issues Vision Screening - Comments:: Last eye exam-02/2021 [place]  Dietary issues and exercise activities discussed: Current Exercise Habits: The patient does not participate in regular exercise at present, Exercise limited by: None identified   Goals Addressed             This Visit's Progress    DIET - EAT MORE FRUITS AND VEGETABLES   On track    Increase physical activity   Not on track    Patient Stated       Lose some weight       Depression Screen PHQ 2/9 Scores 06/06/2021 02/13/2021 11/12/2020 10/30/2019 09/19/2015 08/26/2015 08/02/2015  PHQ - 2 Score 1 0 1 0 0 0 0   Exception Documentation - - - - Patient refusal - -    Fall Risk Fall Risk  06/06/2021 02/13/2021 12/11/2020 11/12/2020 10/30/2019  Falls in the past year? 0 0 0 0 0  Number falls in past yr: 0 0 - - 0  Injury with Fall? 0 0 - - 0  Risk for fall due to : - No Fall Risks - - -  Follow up Falls prevention discussed Falls evaluation completed - - Education provided;Falls prevention discussed    FALL RISK PREVENTION PERTAINING TO THE HOME:  Any stairs in or around the home? Yes  If so, are there any without handrails? No  Home free of loose throw rugs in walkways, pet beds, electrical cords, etc? Yes  Adequate lighting in your home to reduce risk of falls? Yes   ASSISTIVE DEVICES UTILIZED TO PREVENT FALLS:  Life alert? No  Use of a cane, walker or w/c? Yes  Grab bars in the bathroom? No  Shower chair or bench in shower? Yes  Elevated toilet seat or a handicapped toilet? No   TIMED UP AND GO:  Was the test performed? Yes .  Length of time to ambulate 10 feet: 11 sec.   Gait steady and fast with assistive device  Cognitive Function:Normal cognitive status assessed by direct observation by this Nurse Health Advisor. No abnormalities found.          Immunizations Immunization History  Administered Date(s) Administered   Fluad Quad(high Dose 65+) 04/29/2019, 04/17/2020, 06/06/2021   Influenza, High Dose Seasonal PF 06/17/2017, 05/26/2018   Influenza,inj,Quad PF,6+ Mos 05/08/2015   Influenza-Unspecified 04/20/2015, 07/17/2016   PFIZER Comirnaty(Gray Top)Covid-19 Tri-Sucrose Vaccine 02/13/2021   PFIZER(Purple Top)SARS-COV-2 Vaccination 10/06/2019, 11/01/2019, 06/07/2020   Pneumococcal Conjugate-13 11/25/2017   Pneumococcal Polysaccharide-23 05/08/2015, 05/20/2020   Tdap 12/31/2016    TDAP status: Up to date  Flu Vaccine status: Completed at today's visit  Pneumococcal vaccine status: Up to date  Covid-19 vaccine status: Information provided on how to obtain vaccines.  Qualifies for Shingles Vaccine? Yes   Zostavax completed No   Shingrix Completed?: No.    Education has been provided regarding the importance of this vaccine. Patient has been advised to call insurance company to determine out of pocket expense if they have not yet received this vaccine. Advised may also receive vaccine at local pharmacy or Health Dept. Verbalized acceptance and understanding.  Screening Tests Health Maintenance  Topic Date Due   Zoster Vaccines- Shingrix (1 of 2) Never done   COVID-19 Vaccine (5 - Booster for Pfizer series) 04/10/2021   HEMOGLOBIN A1C  07/20/2021   FOOT EXAM  07/24/2021   OPHTHALMOLOGY EXAM  08/27/2021   Fecal DNA (Cologuard)  07/18/2022   MAMMOGRAM  07/30/2022   TETANUS/TDAP  01/01/2027   Pneumonia Vaccine 36+ Years old  Completed   INFLUENZA VACCINE  Completed   DEXA SCAN  Completed   Hepatitis C Screening  Completed   HPV VACCINES  Aged Out    Health Maintenance  Health Maintenance Due  Topic Date Due   Zoster Vaccines- Shingrix (1 of 2) Never done   COVID-19 Vaccine (5 - Booster for Pfizer series) 04/10/2021    Colorectal cancer screening: Type of screening: Cologuard. Completed 07/19/2019. Repeat every 3 years  Mammogram status: Completed bilateral 07/30/2020. Repeat every year  Bone Density status: Completed 08/28/2020. Results reflect: Bone density results: NORMAL. Repeat every 2 years.  Lung Cancer Screening: (Low Dose CT Chest recommended if Age 86-80 years, 30 pack-year currently smoking OR have quit w/in 15years.) does not qualify.     Additional Screening:  Hepatitis C Screening: Completed 11/26/2016  Vision Screening: Recommended annual ophthalmology exams for early detection of glaucoma and other disorders of the eye. Is the patient up to date with their annual eye exam?  Yes  Who is the provider or what is the name of the office in which the patient attends annual eye exams? [place] Screening:  Recommended annual dental exams for proper oral hygiene  Community Resource Referral / Chronic Care Management: CRR required this visit?  Yes for food, transportation & social activities  CCM required this visit?  No      Plan:     I have personally reviewed and noted the following in the patient's chart:   Medical and social history Use of alcohol, tobacco or illicit drugs  Current medications and supplements including opioid prescriptions.  Functional ability and status Nutritional status Physical activity Advanced directives List of other physicians Hospitalizations, surgeries, and ER visits in previous 12 months Vitals Screenings to include cognitive, depression, and falls Referrals and appointments  In addition, I have reviewed and discussed with patient certain preventive protocols, quality metrics, and best practice recommendations. A written personalized care plan for preventive services as well as general preventive health recommendations were provided to patient.   Patient to access avs on mychart  Marta Antu, [place]   [id number]  Nurse Health Advisor  Nurse Notes: None

## 2021-06-11 ENCOUNTER — Other Ambulatory Visit: Payer: Self-pay

## 2021-06-11 ENCOUNTER — Ambulatory Visit
Admission: RE | Admit: 2021-06-11 | Discharge: 2021-06-11 | Disposition: A | Discharge status: Home / Self Care | Payer: Medicare Other | Source: Ambulatory Visit | Attending: Urology | Admitting: Urology

## 2021-06-11 DIAGNOSIS — N281 Cyst of kidney, acquired: Secondary | ICD-10-CM

## 2021-06-21 ENCOUNTER — Other Ambulatory Visit (HOSPITAL_BASED_OUTPATIENT_CLINIC_OR_DEPARTMENT_OTHER): Payer: Self-pay

## 2021-06-21 MED ORDER — PFIZER COVID-19 VAC BIVALENT 30 MCG/0.3ML IM SUSP
INTRAMUSCULAR | 0 refills | Status: DC
  Filled 2021-06-21: qty 0.3, 1d supply, fill #0

## 2021-06-23 ENCOUNTER — Telehealth: Payer: Self-pay

## 2021-06-23 ENCOUNTER — Other Ambulatory Visit (HOSPITAL_BASED_OUTPATIENT_CLINIC_OR_DEPARTMENT_OTHER): Payer: Self-pay

## 2021-06-23 NOTE — Telephone Encounter (Signed)
   Telephone encounter was:  Successful.  06/23/2021 Name: Emily Wood MRN: [id number] DOB: Sep 23, 1950  Emily Wood is a 70 y.o. year old female who is a primary care patient of Copland, Gay Filler, MD . The community resource team was consulted for assistance with Transportation Needs , Food Insecurity, and social activities  Care guide performed the following interventions: Spoke with patient [employer] on [employer], Hydrologist and [employer].  Patient stated she did not need transportation but took the number to use for local transportation.  Follow Up Plan:  Care guide will follow up with patient by phone over the next 8-37 days  Isham Smitherman, AAS Paralegal, [facility]  [address], [place] [address] ??[email or web address]  ?? [phone]   [email or web address]

## 2021-06-29 ENCOUNTER — Ambulatory Visit
Admission: RE | Admit: 2021-06-29 | Discharge: 2021-06-29 | Disposition: A | Discharge status: Home / Self Care | Payer: Medicare Other | Source: Ambulatory Visit | Attending: Urology | Admitting: Urology

## 2021-06-29 ENCOUNTER — Other Ambulatory Visit: Payer: Self-pay

## 2021-06-29 DIAGNOSIS — N2889 Other specified disorders of kidney and ureter: Secondary | ICD-10-CM | POA: Diagnosis not present

## 2021-06-29 DIAGNOSIS — N281 Cyst of kidney, acquired: Secondary | ICD-10-CM | POA: Diagnosis not present

## 2021-06-29 MED ORDER — GADOBENATE DIMEGLUMINE 529 MG/ML IV SOLN
20.0000 mL | Freq: Once | INTRAVENOUS | Status: AC | PRN
  Administered 2021-06-29: 20 mL via INTRAVENOUS

## 2021-07-02 DIAGNOSIS — Z79899 Other long term (current) drug therapy: Secondary | ICD-10-CM | POA: Diagnosis not present

## 2021-07-02 DIAGNOSIS — M1711 Unilateral primary osteoarthritis, right knee: Secondary | ICD-10-CM | POA: Diagnosis not present

## 2021-07-02 DIAGNOSIS — I2510 Atherosclerotic heart disease of native coronary artery without angina pectoris: Secondary | ICD-10-CM | POA: Diagnosis not present

## 2021-07-03 LAB — BASIC METABOLIC PANEL
BUN/Creatinine Ratio: 18 (ref 12–28)
BUN: 18 mg/dL (ref 8–27)
CO2: 21 mmol/L (ref 20–29)
Calcium: 10.2 mg/dL (ref 8.7–10.3)
Chloride: 97 mmol/L (ref 96–106)
Creatinine, Ser: 0.99 mg/dL (ref 0.57–1.00)
Glucose: 99 mg/dL (ref 70–99)
Potassium: 3.7 mmol/L (ref 3.5–5.2)
Sodium: 138 mmol/L (ref 134–144)
eGFR: 62 mL/min/{1.73_m2} (ref >59)

## 2021-07-03 LAB — MAGNESIUM: Magnesium: 1.4 mg/dL — ABNORMAL LOW (ref 1.6–2.3)

## 2021-07-03 LAB — LIPID PANEL
Chol/HDL Ratio: 2.3 ratio (ref 0.0–4.4)
Cholesterol, Total: 129 mg/dL (ref 100–199)
HDL: 56 mg/dL (ref >39)
LDL Chol Calc (NIH): 59 mg/dL (ref 0–99)
Triglycerides: 68 mg/dL (ref 0–149)
VLDL Cholesterol Cal: 14 mg/dL (ref 5–40)

## 2021-07-04 ENCOUNTER — Other Ambulatory Visit: Payer: Self-pay

## 2021-07-04 MED ORDER — MAGNESIUM 400 MG PO CAPS: 400.0000 mg | ORAL_CAPSULE | Freq: Two times a day (BID) | ORAL | 0 refills | Status: DC

## 2021-07-04 MED ORDER — ATORVASTATIN CALCIUM 40 MG PO TABS: 40.0000 mg | ORAL_TABLET | Freq: Every day | ORAL | 3 refills | Status: DC

## 2021-07-04 NOTE — Progress Notes (Signed)
Prescription sent to pharmacy. Pt requested a refill for Lipitor. Refill sent.

## 2021-07-08 DIAGNOSIS — E119 Type 2 diabetes mellitus without complications: Secondary | ICD-10-CM | POA: Diagnosis not present

## 2021-07-08 DIAGNOSIS — H53453 Other localized visual field defect, bilateral: Secondary | ICD-10-CM | POA: Diagnosis not present

## 2021-07-09 ENCOUNTER — Telehealth: Payer: Self-pay

## 2021-07-09 NOTE — Telephone Encounter (Signed)
° °  Telephone encounter was:  Successful.  07/09/2021 Name: Emily Wood MRN: [id number] DOB: May 24, 1951  Emily Wood is a 70 y.o. year old female who is a primary care patient of Copland, Gay Filler, MD . The community resource team was consulted for assistance with Transportation Needs , Food Insecurity, and senior activities  Care guide performed the following interventions: Spoke with patient to follow-up she still has the contact numbers for [employer], [employer] transportation and [employer] but has been busy and unable to call.  She plans on calling them after the holidays.    Follow Up Plan:  No further follow up planned at this time. The patient has been provided with needed resources.  Amijah Timothy, AAS Paralegal, [facility]  [address], [place] [address] ??[email or web address]   ?? [phone]   [email or web address]

## 2021-07-29 ENCOUNTER — Other Ambulatory Visit: Payer: Self-pay | Admitting: Family Medicine

## 2021-07-29 DIAGNOSIS — Z1231 Encounter for screening mammogram for malignant neoplasm of breast: Secondary | ICD-10-CM

## 2021-08-04 ENCOUNTER — Ambulatory Visit: Payer: Medicare Other | Admitting: Medical

## 2021-08-04 ENCOUNTER — Ambulatory Visit (INDEPENDENT_AMBULATORY_CARE_PROVIDER_SITE_OTHER): Payer: Medicare Other | Admitting: Medical

## 2021-08-04 ENCOUNTER — Encounter: Payer: Self-pay | Admitting: Medical

## 2021-08-04 VITALS — BP 130/60 | HR 69 | Temp 98.2°F | Resp 18 | Ht 65.0 in | Wt 233.6 lb

## 2021-08-04 DIAGNOSIS — R7989 Other specified abnormal findings of blood chemistry: Secondary | ICD-10-CM | POA: Diagnosis not present

## 2021-08-04 DIAGNOSIS — S46811S Strain of other muscles, fascia and tendons at shoulder and upper arm level, right arm, sequela: Secondary | ICD-10-CM

## 2021-08-04 DIAGNOSIS — I10 Essential (primary) hypertension: Secondary | ICD-10-CM | POA: Diagnosis not present

## 2021-08-04 MED ORDER — DICLOFENAC SODIUM 1 % EX GEL: 4.0000 g | Freq: Four times a day (QID) | CUTANEOUS | 0 refills | Status: AC

## 2021-08-04 NOTE — Patient Instructions (Addendum)
Trapezius muscle strain and pain persists. On exam pain not directly in shoulder. Since 3 weeks with symptoms did offer sports med referral and you declined. You could use diclofenac get and tylenol by mouth.  Htn- bp rechecked and you blood pressure is well controlled. Continue current bp meds.  Ask you call your eye MD. Regarding labs you think they had mentioned.  Give me update/my chart message if you change mind on sports med referral.  Follow up as regularly scheduled with pcp or sooner if needed.

## 2021-08-04 NOTE — Progress Notes (Signed)
° °  Subjective:    Patient ID: Emily Wood, female    DOB: 1951-07-12, 71 y.o.   MRN: [id number]  HPI  Pt in for 3 weeks of rt shoulder pain an some neck pain.She points to lateral trap area  No fall or injury. Pain came on slowly. Just woke up one day and felt the area was tight.   Has tried tylenol, icey hot and asper cream patch.     Review of Systems Rt trapezius tenderness to palpation and on range of motion.    Objective:   Physical Exam  General- No acute distress. Pleasant patient. Neck- Full range of motion, no jvd Lungs- Clear, even and unlabored. Heart- regular rate and rhythm. Neurologic- CNII- XII grossly intact.  Shoulder- good rom, no crepitus. Neck- pain posterior trap to palpation.. Pain turing head to right.no neck stiffness.       Assessment & Plan:   Patient Instructions  Trapezius muscle strain and pain persists. On exam pain not directly in shoulder. Since 3 weeks with symptoms did offer sports med referral and you declined. You could use diclofenac get and tylenol by mouth.  Htn- bp rechecked and you blood pressure is well controlled. Continue current bp meds.  Ask you call your eye MD. Regarding labs you think they had mentioned.  Give me update/my chart message if you change mind on sports med referral.  Follow up as regularly scheduled with pcp or sooner if needed.    Mackie Pai, PA-C   Time spent with patient today was 22  minutes which consisted of chart revdiew, discussing diagnosis, work up treatment and documentation.

## 2021-08-06 ENCOUNTER — Other Ambulatory Visit: Payer: Self-pay | Admitting: Family Medicine

## 2021-08-06 DIAGNOSIS — M5432 Sciatica, left side: Secondary | ICD-10-CM

## 2021-08-20 ENCOUNTER — Ambulatory Visit
Admission: RE | Admit: 2021-08-20 | Discharge: 2021-08-20 | Disposition: A | Discharge status: Home / Self Care | Payer: Medicare Other | Source: Ambulatory Visit | Attending: Family Medicine | Admitting: Family Medicine

## 2021-08-20 DIAGNOSIS — Z1231 Encounter for screening mammogram for malignant neoplasm of breast: Secondary | ICD-10-CM | POA: Diagnosis not present

## 2021-08-25 ENCOUNTER — Ambulatory Visit (INDEPENDENT_AMBULATORY_CARE_PROVIDER_SITE_OTHER): Payer: Medicare Other | Admitting: Vascular Surgery

## 2021-08-25 ENCOUNTER — Other Ambulatory Visit: Payer: Self-pay

## 2021-08-25 ENCOUNTER — Encounter (INDEPENDENT_AMBULATORY_CARE_PROVIDER_SITE_OTHER): Payer: Self-pay | Admitting: Vascular Surgery

## 2021-08-25 VITALS — BP 156/79 | HR 65 | Ht 65.0 in | Wt 235.0 lb

## 2021-08-25 DIAGNOSIS — I890 Lymphedema, not elsewhere classified: Secondary | ICD-10-CM | POA: Diagnosis not present

## 2021-08-25 DIAGNOSIS — I10 Essential (primary) hypertension: Secondary | ICD-10-CM

## 2021-08-25 DIAGNOSIS — I2510 Atherosclerotic heart disease of native coronary artery without angina pectoris: Secondary | ICD-10-CM

## 2021-08-25 DIAGNOSIS — E119 Type 2 diabetes mellitus without complications: Secondary | ICD-10-CM

## 2021-08-25 DIAGNOSIS — I872 Venous insufficiency (chronic) (peripheral): Secondary | ICD-10-CM

## 2021-08-25 NOTE — Progress Notes (Signed)
MRN : [id number]  Emily Wood is a 71 y.o. (October 17, 1950) female who presents with chief complaint of follow up for swelling.  History of Present Illness:   The patient returns to the office for followup evaluation regarding leg swelling.  The swelling has persisted but with the lymph pump is controlled. The pain associated with swelling is essentially eliminated but she is concerned about the discoloration. There have not been any interval development of a ulcerations or wounds.   The patient denies problems with the pump, noting it is working well and the leggings are in good condition but she admits she has not been using it as much as she should.   Since the previous visit the patient has been wearing graduated compression stockings and using the lymph pump on a routine basis and  has noted significant improvement in the lymphedema.    Patient stated the lymph pump has been a very positive factor in her care.   No outpatient medications have been marked as taking for the 08/25/21 encounter (Appointment) with Delana Meyer, Dolores Lory, MD.    Past Medical History:  Diagnosis Date   Arthritis    Diabetes mellitus without complication ([place])    Graves disease 04/14/2020   Graves disease 07/20/2020   Graves disease    Hypertension    Mixed hyperlipidemia 02/20/2021    Past Surgical History:  Procedure Laterality Date   CHOLECYSTECTOMY     HERNIA REPAIR     OTHER SURGICAL HISTORY     scar tissue removal from bowels   PARTIAL HYSTERECTOMY     Still has ovaries   UTERINE FIBROID SURGERY      Social History Social History   Tobacco Use   Smoking status: Never   Smokeless tobacco: Never  Vaping Use   Vaping Use: Never used  Substance Use Topics   Alcohol use: No    Alcohol/week: 0.0 standard drinks    Comment: rarely   Drug use: No    Family History Family History  Problem Relation Age of Onset   Hyperlipidemia Mother    Cancer Sister    Hyperlipidemia Maternal Aunt     Diabetes Maternal Aunt    Breast cancer Cousin    Diabetes Maternal Uncle     No Known Allergies   REVIEW OF SYSTEMS (Negative unless checked)  Constitutional: [] Weight loss  [] Fever  [] Chills Cardiac: [] Chest pain   [] Chest pressure   [] Palpitations   [] Shortness of breath when laying flat   [] Shortness of breath with exertion. Vascular:  [] Pain in legs with walking   [] Pain in legs at rest  [] History of DVT   [] Phlebitis   [x] Swelling in legs   [] Varicose veins   [] Non-healing ulcers Pulmonary:   [] Uses home oxygen   [] Productive cough   [] Hemoptysis   [] Wheeze  [] COPD   [] Asthma Neurologic:  [] Dizziness   [] Seizures   [] History of stroke   [] History of TIA  [] Aphasia   [] Vissual changes   [] Weakness or numbness in arm   [] Weakness or numbness in leg Musculoskeletal:   [] Joint swelling   [] Joint pain   [] Low back pain Hematologic:  [] Easy bruising  [] Easy bleeding   [] Hypercoagulable state   [] Anemic Gastrointestinal:  [] Diarrhea   [] Vomiting  [] Gastroesophageal reflux/heartburn   [] Difficulty swallowing. Genitourinary:  [] Chronic kidney disease   [] Difficult urination  [] Frequent urination   [] Blood in urine Skin:  [] Rashes   [] Ulcers  Psychological:  [] History of anxiety   []   History of major depression.  Physical Examination  There were no vitals filed for this visit. There is no height or weight on file to calculate BMI. Gen: WD/WN, NAD Head: [place]/AT, No temporalis wasting.  Ear/Nose/Throat: Hearing grossly intact, nares w/o erythema or drainage, pinna without lesions Eyes: PER, EOMI, sclera nonicteric.  Neck: Supple, no gross masses.  No JVD.  Pulmonary:  Good air movement, no audible wheezing, no use of accessory muscles.  Cardiac: RRR, precordium not hyperdynamic. Vascular:  scattered varicosities present bilaterally.  Severe venous stasis changes to the legs bilaterally.  3+ soft pitting edema  Vessel Right Left  Radial Palpable Palpable  Gastrointestinal: soft,  non-distended. No guarding/no peritoneal signs.  Musculoskeletal: M/S 5/5 throughout.  No deformity.  Neurologic: CN 2-12 intact. Pain and light touch intact in extremities.  Symmetrical.  Speech is fluent. Motor exam as listed above. Psychiatric: Judgment intact, Mood & affect appropriate for pt's clinical situation. Dermatologic: Severe venous rashes no ulcers noted.  No changes consistent with cellulitis. Lymph : + lichenification and skin changes of chronic lymphedema.  CBC Lab Results  Component Value Date   WBC 6.6 07/04/2020   HGB 10.3 (L) 07/04/2020   HCT 31.6 (L) 07/04/2020   MCV 82.7 07/04/2020   PLT 426.0 (H) 07/04/2020    BMET    Component Value Date/Time   NA 138 07/02/2021 0944   K 3.7 07/02/2021 0944   CL 97 07/02/2021 0944   CO2 21 07/02/2021 0944   GLUCOSE 99 07/02/2021 0944   GLUCOSE 84 07/24/2020 0958   BUN 18 07/02/2021 0944   CREATININE 0.99 07/02/2021 0944   CREATININE 1.07 (H) 04/17/2020 1018   CALCIUM 10.2 07/02/2021 0944   GFRNONAA 60 01/17/2021 0000   CrCl cannot be calculated (Patient's most recent lab result is older than the maximum 21 days allowed.).  COAG No results found for: INR, PROTIME  Radiology MM 3D SCREEN BREAST BILATERAL  Result Date: 08/20/2021 CLINICAL DATA:  Screening. EXAM: DIGITAL SCREENING BILATERAL MAMMOGRAM WITH TOMOSYNTHESIS AND CAD TECHNIQUE: Bilateral screening digital craniocaudal and mediolateral oblique mammograms were obtained. Bilateral screening digital breast tomosynthesis was performed. The images were evaluated with computer-aided detection. COMPARISON:  Previous exam(s). ACR Breast Density Category b: There are scattered areas of fibroglandular density. FINDINGS: There are no findings suspicious for malignancy. IMPRESSION: No mammographic evidence of malignancy. A result letter of this screening mammogram will be mailed directly to the patient. RECOMMENDATION: Screening mammogram in one year. (Code:[id number]-B-01Y) BI-RADS  CATEGORY  1: Negative. Electronically Signed   By: Kristopher Oppenheim M.D.   On: 08/20/2021 12:51     Assessment/Plan 1. Lymphedema  No surgery or intervention at this point in time.    I have reviewed my discussion with the patient regarding lymphedema and why it  causes symptoms.  Patient will continue wearing graduated compression stockings class 1 (20-30 mmHg) on a daily basis a prescription was given. The patient is reminded to put the stockings on first thing in the morning and removing them in the evening. The patient is instructed specifically not to sleep in the stockings.   In addition, behavioral modification throughout the day will be continued.  This will include frequent elevation (such as in a recliner), use of over the counter pain medications as needed and exercise such as walking.  I have reviewed systemic causes for chronic edema such as liver, kidney and cardiac etiologies and there does not appear to be any significant changes in these organ systems over the  past year.  The patient is under the impression that these organ systems are all stable and unchanged.    The patient will continue aggressive use of the  lymph pump.  This will continue to improve the edema control and prevent sequela such as ulcers and infections.   The patient will follow-up with me on an annual basis.    2. Chronic venous insufficiency  No surgery or intervention at this point in time.    I have reviewed my discussion with the patient regarding lymphedema and why it  causes symptoms.  Patient will continue wearing graduated compression stockings class 1 (20-30 mmHg) on a daily basis a prescription was given. The patient is reminded to put the stockings on first thing in the morning and removing them in the evening. The patient is instructed specifically not to sleep in the stockings.   In addition, behavioral modification throughout the day will be continued.  This will include frequent elevation (such  as in a recliner), use of over the counter pain medications as needed and exercise such as walking.  I have reviewed systemic causes for chronic edema such as liver, kidney and cardiac etiologies and there does not appear to be any significant changes in these organ systems over the past year.  The patient is under the impression that these organ systems are all stable and unchanged.    The patient will continue aggressive use of the  lymph pump.  This will continue to improve the edema control and prevent sequela such as ulcers and infections.   The patient will follow-up with me on an annual basis.    3. Essential hypertension Continue antihypertensive medications as already ordered, these medications have been reviewed and there are no changes at this time.   4. Coronary artery disease involving native coronary artery of native heart without angina pectoris Continue cardiac and antihypertensive medications as already ordered and reviewed, no changes at this time.  Continue statin as ordered and reviewed, no changes at this time  Nitrates PRN for chest pain   5. Controlled type 2 diabetes mellitus without complication, without long-term current use of insulin (HCC) Continue hypoglycemic medications as already ordered, these medications have been reviewed and there are no changes at this time.  Hgb A1C to be monitored as already arranged by primary service     Hortencia Pilar, MD  08/25/2021 8:24 AM

## 2021-09-10 ENCOUNTER — Other Ambulatory Visit: Payer: Self-pay | Admitting: Family Medicine

## 2021-09-10 DIAGNOSIS — E119 Type 2 diabetes mellitus without complications: Secondary | ICD-10-CM

## 2021-09-17 DIAGNOSIS — R350 Frequency of micturition: Secondary | ICD-10-CM | POA: Diagnosis not present

## 2021-09-25 ENCOUNTER — Other Ambulatory Visit: Payer: Self-pay | Admitting: Family Medicine

## 2021-09-25 DIAGNOSIS — E119 Type 2 diabetes mellitus without complications: Secondary | ICD-10-CM

## 2021-09-29 DIAGNOSIS — E119 Type 2 diabetes mellitus without complications: Secondary | ICD-10-CM | POA: Diagnosis not present

## 2021-09-29 DIAGNOSIS — E0500 Thyrotoxicosis with diffuse goiter without thyrotoxic crisis or storm: Secondary | ICD-10-CM | POA: Diagnosis not present

## 2021-09-30 NOTE — Progress Notes (Signed)
Therapist, music at [facility] ?[address], [address] ?[place], [place] [address] ?336 [phone] ?Fax [phone] ? ?Date:  10/01/2021  ? ?Name:  Emily Wood   DOB:  12/30/1950   MRN:  [id number] ? ?PCP:  Darreld Mclean, MD  ? ? ?Chief Complaint: Skin Problem (Bump between neck and L shoulder, started about a week ago.) ? ? ?History of Present Illness: ? ?Emily Wood is a 71 y.o. very pleasant female patient who presents with the following: ? ?Patient with history of diabetes, chronic kidney disease, hypertension, venous insufficiency and lymphedema, obesity, Graves' hyperthyroidism in remission ?Patient is seen today with concern of a bump on her trunk ?Most recent visit with myself was in [place] actually had a complaint of a bump over her left superior trapezius muscle at that time ? ?Ultrasound at that time was inconclusive, we proceeded to an MRI completed August 28 ?IMPRESSION: ?1. 7.5 by 4.4 by 5.0 cm homogeneous fatty mass compatible with ?lipoma deep to the left trapezius and possibly involving the levator ?scapulae muscle. No significant abnormal associated enhancement. ?This is just posterior to regional elements of the brachial plexus. ?Although this lesion is not characterized in an ideal manner on ?today's examination due to focus on the shoulder rather than the ?lower neck region, I do not see any nodular or abnormal enhancing ?elements to indicate malignancy. If the lesion is contributing to ?symptoms or enlarging, resection may be indicated. ? ? ?Injury and notes that the mass over her left shoulder has gotten larger.  Also, over the last few weeks she has started to have pain with motion of her left shoulder, especially with full flexion and abduction.  She has not noted numbness or weakness ? ?A1c can be updated- she is now seeing endocrinology, she will be seen on Monday.  She wonders about taking Ozempic.  I advised her this would be a good question for her  endocrinologist ?Foot exam due- will update today  ?Eye exam- eye exam last year, she will update soon ?Pt notes she just took her BP meds on the way here today -this may be why her blood pressure is a bit elevated ? ?BP Readings from Last 3 Encounters:  ?10/01/21 (!) 160/80  ?08/25/21 (!) 156/79  ?08/04/21 130/60  ? ? ?Patient Active Problem List  ? Diagnosis Date Noted  ? Coronary artery disease involving native coronary artery of native heart without angina pectoris 05/15/2021  ? Nonrheumatic tricuspid valve regurgitation 05/15/2021  ? Medication management 05/15/2021  ? Pulmonary hypertension, unspecified ([place]) 05/15/2021  ? Precordial pain 02/20/2021  ? Nonspecific abnormal electrocardiogram (ECG) (EKG) 02/20/2021  ? Mixed hyperlipidemia 02/20/2021  ? Obesity (BMI 30-39.9) 02/20/2021  ? Arthritis 02/18/2021  ? Diabetic retinopathy ([place]) 08/28/2020  ? Mixed incontinence 05/31/2020  ? Urinary urgency 05/31/2020  ? Graves disease 04/14/2020  ? Right renal mass 11/22/2019  ? Arthritis of left knee 09/25/2019  ? Lymphedema 01/25/2017  ? Fibroid uterus 01/14/2017  ? Hypertensive heart disease without heart failure 10/01/2016  ? Chronic kidney disease, stage 3 ([place]) 09/21/2016  ? Meralgia paresthetica of left side 08/27/2015  ? Diabetes mellitus type 2, controlled ([place]) 08/02/2015  ? Chronic venous insufficiency 06/28/2015  ? Essential hypertension 02/15/2015  ? Peripheral edema 02/15/2015  ? Chronic knee pain 02/15/2015  ? ? ?Past Medical History:  ?Diagnosis Date  ? Arthritis   ? Diabetes mellitus without complication ([place])   ? Graves disease 04/14/2020  ?  Graves disease 07/20/2020  ? Graves disease   ? Hypertension   ? Mixed hyperlipidemia 02/20/2021  ? ? ?Past Surgical History:  ?Procedure Laterality Date  ? CHOLECYSTECTOMY    ? HERNIA REPAIR    ? OTHER SURGICAL HISTORY    ? scar tissue removal from bowels  ? PARTIAL HYSTERECTOMY    ? Still has ovaries  ? UTERINE FIBROID SURGERY    ? ? ?Social History  ? ?Tobacco Use   ? Smoking status: Never  ? Smokeless tobacco: Never  ?Vaping Use  ? Vaping Use: Never used  ?Substance Use Topics  ? Alcohol use: No  ?  Alcohol/week: 0.0 standard drinks  ?  Comment: rarely  ? Drug use: No  ? ? ?Family History  ?Problem Relation Age of Onset  ? Hyperlipidemia Mother   ? Cancer Sister   ? Hyperlipidemia Maternal Aunt   ? Diabetes Maternal Aunt   ? Breast cancer Cousin   ? Diabetes Maternal Uncle   ? ? ?No Known Allergies ? ?Medication list has been reviewed and updated. ? ?Current Outpatient Medications on File Prior to Visit  ?Medication Sig Dispense Refill  ? Accu-Chek Softclix Lancets lancets Use to check blood sugar once day.  Dx code: E11.9 100 each 12  ? Alpha-Lipoic Acid 300 MG CAPS Take 2 capsules by mouth daily.    ? amLODipine (NORVASC) 5 MG tablet TAKE 1 AND 1/2 TABLETS BY  MOUTH DAILY 135 tablet 3  ? aspirin 81 MG tablet Take 81 mg by mouth daily.    ? atorvastatin (LIPITOR) 40 MG tablet Take 1 tablet (40 mg total) by mouth daily. 90 tablet 3  ? cloNIDine (CATAPRES) 0.2 MG tablet TAKE 1 TABLET BY MOUTH 3  TIMES DAILY 270 tablet 3  ? Cobalamin Combinations (B-12) [phone] MCG SUBL B-12 Plus 5,000 mcg-100 mcg sublingual tablet    ? diclofenac Sodium (VOLTAREN) 1 % GEL Apply 4 g topically 4 (four) times daily. 100 g 0  ? ferrous sulfate 325 (65 FE) MG tablet Take 325 mg by mouth daily with breakfast.    ? fluticasone (FLONASE) 50 MCG/ACT nasal spray SPRAY 2 SPRAYS INTO EACH NOSTRIL EVERY DAY 48 g 3  ? gabapentin (NEURONTIN) 400 MG capsule TAKE 2 CAPSULES BY MOUTH 3  TIMES DAILY 540 capsule 3  ? glipiZIDE (GLUCOTROL) 5 MG tablet TAKE 1 TABLET BY MOUTH  DAILY BEFORE BREAKFAST 90 tablet 3  ? glucose blood (ACCU-CHEK GUIDE) test strip Use to check blood sugar once day.  Dx code: E11.9 100 each 1  ? hydrochlorothiazide (HYDRODIURIL) 25 MG tablet TAKE 1 TABLET BY MOUTH  DAILY 90 tablet 3  ? irbesartan (AVAPRO) 300 MG tablet TAKE 1 TABLET BY MOUTH  DAILY 90 tablet 1  ? Magnesium 400 MG CAPS Take  400 mg by mouth in the morning and at bedtime. Take for 5 days 10 capsule 0  ? metFORMIN (GLUCOPHAGE) 1000 MG tablet Take 1 tablet (1,000 mg total) by mouth 2 (two) times daily with a meal. 180 tablet 0  ? methimazole (TAPAZOLE) 5 MG tablet Take 2.5 mg by mouth daily.    ? Multiple Vitamin (MULTIVITAMIN ADULT PO) Take 1 tablet by mouth daily.    ? nystatin cream (MYCOSTATIN) Apply 1 application topically 2 (two) times daily. (Patient taking differently: Apply 1 application. topically 2 (two) times daily as needed.) 30 g 1  ? potassium chloride SA (KLOR-CON) 20 MEQ tablet TAKE 1 TABLET BY MOUTH  DAILY 90 tablet 3  ?  TURMERIC PO Take 1 tablet by mouth daily.    ? UNABLE TO FIND daily. Med Name: Nyoka Cowden Lipped Muscle oil GLX3    ? vitamin C (ASCORBIC ACID) 500 MG tablet Take 500 mg by mouth daily.    ? carvedilol (COREG) 12.5 MG tablet Take 1 tablet (12.5 mg total) by mouth 2 (two) times daily. 180 tablet 3  ? ?No current facility-administered medications on file prior to visit.  ? ? ?Review of Systems: ? ?As per HPI- otherwise negative. ? ? ?Physical Examination: ?Vitals:  ? 10/01/21 1347 10/01/21 1407  ?BP: (!) 185/84 (!) 160/80  ?Pulse: 63   ?Resp: 20   ?SpO2: 98%   ? ?Vitals:  ? 10/01/21 1347  ?Weight: 227 lb 6.4 oz (103.1 kg)  ?Height: '5\' 5"'$  (1.651 m)  ? ?Body mass index is 37.84 kg/m?. ?Ideal Body Weight: Weight in (lb) to have BMI = 25: 149.9 ? ?GEN: no acute distress.  Obese, looks well ?HEENT: Atraumatic, Normocephalic.  ?Ears and Nose: No external deformity. ?CV: RRR, No M/G/R. No JVD. No thrill. No extra heart sounds. ?PULM: CTA B, no wheezes, crackles, rhonchi. No retractions. No resp. distress. No accessory muscle use. ?EXTR: No c/c/e ?PSYCH: Normally interactive. Conversant.  ?Chronic LE edema bilaterally, wearing compression  bilaterally ?Normal LE pulses and sensation  ?Previously noted lipoma over left trapezius muscle is larger and seems more prominent in the anterior aspect.  She has discomfort flexing  her shoulder past 150 degrees, also some discomfort with full abduction. ? ? ?Assessment and Plan: ?Essential hypertension ? ?Soft tissue mass - Plan: methocarbamol (ROBAXIN) 500 MG tablet, Ambulatory

## 2021-09-30 NOTE — Patient Instructions (Addendum)
It was good to see you again today ?You might consider getting the shingles vaccine series at your pharmacy if not done already ? ?I will figure out which type of surgeon you need to see and set it up for you!   ? ?Please ask your endocrinologist about Ozempic as part of your diabetes management plan  ?

## 2021-10-01 ENCOUNTER — Encounter: Payer: Self-pay | Admitting: Family Medicine

## 2021-10-01 ENCOUNTER — Ambulatory Visit (INDEPENDENT_AMBULATORY_CARE_PROVIDER_SITE_OTHER): Payer: Medicare Other | Admitting: Family Medicine

## 2021-10-01 VITALS — BP 160/80 | HR 63 | Resp 20 | Ht 65.0 in | Wt 227.4 lb

## 2021-10-01 DIAGNOSIS — I10 Essential (primary) hypertension: Secondary | ICD-10-CM | POA: Diagnosis not present

## 2021-10-01 DIAGNOSIS — M7989 Other specified soft tissue disorders: Secondary | ICD-10-CM | POA: Diagnosis not present

## 2021-10-01 DIAGNOSIS — M25512 Pain in left shoulder: Secondary | ICD-10-CM

## 2021-10-01 DIAGNOSIS — G8929 Other chronic pain: Secondary | ICD-10-CM

## 2021-10-01 MED ORDER — METHOCARBAMOL 500 MG PO TABS: 500.0000 mg | ORAL_TABLET | Freq: Three times a day (TID) | ORAL | 0 refills | Status: DC | PRN

## 2021-10-06 DIAGNOSIS — E119 Type 2 diabetes mellitus without complications: Secondary | ICD-10-CM | POA: Diagnosis not present

## 2021-10-06 DIAGNOSIS — E0500 Thyrotoxicosis with diffuse goiter without thyrotoxic crisis or storm: Secondary | ICD-10-CM | POA: Diagnosis not present

## 2021-10-10 ENCOUNTER — Encounter: Payer: Self-pay | Admitting: Cardiology

## 2021-10-10 DIAGNOSIS — M1711 Unilateral primary osteoarthritis, right knee: Secondary | ICD-10-CM | POA: Diagnosis not present

## 2021-10-12 ENCOUNTER — Encounter: Payer: Self-pay | Admitting: Family Medicine

## 2021-10-15 ENCOUNTER — Telehealth: Payer: Self-pay | Admitting: Family Medicine

## 2021-10-15 NOTE — Telephone Encounter (Signed)
Pt called because she is very displeased with [facility]. She called them to let them know she received CD and was under the impression her appt had been moved up to tomorrow. The person she spoke with today stated they were in the process of moving the appt to sooner, but as of right now is not until the 25th. She is in a great deal of pain and is hoping Dr.Copland could put in a new referral somewhere else. Please advise.  ?

## 2021-10-15 NOTE — Telephone Encounter (Signed)
fyi

## 2021-10-15 NOTE — Telephone Encounter (Signed)
Patient stopped by just to let Copland know she has picked up the CD of her MRI scan ? ? ?

## 2021-10-15 NOTE — Telephone Encounter (Signed)
This is in regard to her Ortho apt with [facility].  ?

## 2021-10-15 NOTE — Telephone Encounter (Signed)
Called pt back- she actually did get an appt for tomorrow so all is well  ?JC ?

## 2021-10-16 DIAGNOSIS — D1722 Benign lipomatous neoplasm of skin and subcutaneous tissue of left arm: Secondary | ICD-10-CM | POA: Diagnosis not present

## 2021-10-16 DIAGNOSIS — M25512 Pain in left shoulder: Secondary | ICD-10-CM | POA: Diagnosis not present

## 2021-10-16 DIAGNOSIS — R2232 Localized swelling, mass and lump, left upper limb: Secondary | ICD-10-CM | POA: Diagnosis not present

## 2021-10-16 DIAGNOSIS — M25812 Other specified joint disorders, left shoulder: Secondary | ICD-10-CM | POA: Diagnosis not present

## 2021-10-17 DIAGNOSIS — M25512 Pain in left shoulder: Secondary | ICD-10-CM | POA: Diagnosis not present

## 2021-10-17 DIAGNOSIS — D179 Benign lipomatous neoplasm, unspecified: Secondary | ICD-10-CM | POA: Insufficient documentation

## 2021-10-17 DIAGNOSIS — M7552 Bursitis of left shoulder: Secondary | ICD-10-CM | POA: Insufficient documentation

## 2021-10-28 ENCOUNTER — Other Ambulatory Visit: Payer: Self-pay | Admitting: Family Medicine

## 2021-10-28 DIAGNOSIS — M25512 Pain in left shoulder: Secondary | ICD-10-CM | POA: Diagnosis not present

## 2021-10-28 DIAGNOSIS — I10 Essential (primary) hypertension: Secondary | ICD-10-CM

## 2021-11-04 ENCOUNTER — Ambulatory Visit: Payer: Medicare Other | Admitting: Cardiology

## 2021-11-04 ENCOUNTER — Encounter: Payer: Self-pay | Admitting: Cardiology

## 2021-11-04 VITALS — BP 150/76 | HR 58 | Ht 66.0 in | Wt 231.0 lb

## 2021-11-04 DIAGNOSIS — I10 Essential (primary) hypertension: Secondary | ICD-10-CM

## 2021-11-04 DIAGNOSIS — I071 Rheumatic tricuspid insufficiency: Secondary | ICD-10-CM | POA: Diagnosis not present

## 2021-11-04 DIAGNOSIS — I272 Pulmonary hypertension, unspecified: Secondary | ICD-10-CM

## 2021-11-04 DIAGNOSIS — R400 Somnolence: Secondary | ICD-10-CM

## 2021-11-04 DIAGNOSIS — E1100 Type 2 diabetes mellitus with hyperosmolarity without nonketotic hyperglycemic-hyperosmolar coma (NKHHC): Secondary | ICD-10-CM

## 2021-11-04 DIAGNOSIS — E669 Obesity, unspecified: Secondary | ICD-10-CM

## 2021-11-04 DIAGNOSIS — I2510 Atherosclerotic heart disease of native coronary artery without angina pectoris: Secondary | ICD-10-CM | POA: Diagnosis not present

## 2021-11-04 DIAGNOSIS — E782 Mixed hyperlipidemia: Secondary | ICD-10-CM

## 2021-11-04 DIAGNOSIS — R0683 Snoring: Secondary | ICD-10-CM

## 2021-11-04 MED ORDER — FUROSEMIDE 40 MG PO TABS: 40.0000 mg | ORAL_TABLET | Freq: Every day | ORAL | 3 refills | Status: DC

## 2021-11-04 MED ORDER — FUROSEMIDE 40 MG PO TABS: 40.0000 mg | ORAL_TABLET | ORAL | 3 refills | Status: DC

## 2021-11-04 NOTE — Patient Instructions (Addendum)
Medication Instructions:  ?Your physician has recommended you make the following change in your medication:  ?START: Lasix 40 mg once weekly ?*If you need a refill on your cardiac medications before your next appointment, please call your pharmacy* ? ? ?Lab Work: ?None ?If you have labs (blood work) drawn today and your tests are completely normal, you will receive your results only by: ?MyChart Message (if you have MyChart) OR ?A paper copy in the mail ?If you have any lab test that is abnormal or we need to change your treatment, we will call you to review the results. ? ? ?Testing/Procedures: ?Your physician has requested that you have an echocardiogram. Echocardiography is a painless test that uses sound waves to create images of your heart. It provides your doctor with information about the size and shape of your heart and how well your heart?s chambers and valves are working. This procedure takes approximately one hour. There are no restrictions for this procedure. ? ?Your physician has recommended that you have a sleep study. This test records several body functions during sleep, including: brain activity, eye movement, oxygen and carbon dioxide blood levels, heart rate and rhythm, breathing rate and rhythm, the flow of air through your mouth and nose, snoring, body muscle movements, and chest and belly movement. ? ? ? ?Follow-Up: ?At [facility], you and your health needs are our priority.  As part of our continuing mission to provide you with exceptional heart care, we have created designated Provider Care Teams.  These Care Teams include your primary Cardiologist (physician) and Advanced Practice Providers (APPs -  Physician Assistants and Nurse Practitioners) who all work together to provide you with the care you need, when you need it. ? ?We recommend signing up for the patient portal called "MyChart".  Sign up information is provided on this After Visit Summary.  MyChart is used to connect with  patients for Virtual Visits (Telemedicine).  Patients are able to view lab/test results, encounter notes, upcoming appointments, etc.  Non-urgent messages can be sent to your provider as well.   ?To learn more about what you can do with MyChart, go to [email or web address].   ? ?Your next appointment:   ?4 month(s) ? ?The format for your next appointment:   ?In Person ? ?Provider:   ?Berniece Salines, DO   ? ? ?Other Instructions ? ? ?Important Information About Sugar ? ? ? ? ? Your physician recommends that you continue on your current medications as directed. Please refer to the Current Medication list given to you today.  ?

## 2021-11-05 DIAGNOSIS — I10 Essential (primary) hypertension: Secondary | ICD-10-CM | POA: Insufficient documentation

## 2021-11-05 DIAGNOSIS — R0683 Snoring: Secondary | ICD-10-CM | POA: Insufficient documentation

## 2021-11-05 DIAGNOSIS — R400 Somnolence: Secondary | ICD-10-CM | POA: Insufficient documentation

## 2021-11-05 DIAGNOSIS — I071 Rheumatic tricuspid insufficiency: Secondary | ICD-10-CM | POA: Insufficient documentation

## 2021-11-05 DIAGNOSIS — E1100 Type 2 diabetes mellitus with hyperosmolarity without nonketotic hyperglycemic-hyperosmolar coma (NKHHC): Secondary | ICD-10-CM | POA: Insufficient documentation

## 2021-11-05 NOTE — Progress Notes (Signed)
?Cardiology Office Note:   ? ?Date:  11/05/2021  ? ?ID:  Emily Wood, DOB 1950/09/02, MRN [id number] ? ?PCP:  Darreld Mclean, MD  ?Cardiologist:  Berniece Salines, DO  ?Electrophysiologist:  None  ? ?Referring MD: Darreld Mclean, MD  ? ?"I am experiencing fatigue" ? ?History of Present Illness:   ? ?Emily Wood is a 71 y.o. female with a hx of recently diagnosed coronary artery disease seen on her coronary CTA, mild to moderate tricuspid regurgitation, pulmonary hypertension, chronic kidney disease, Graves' disease, hyperlipidemia, type 2 diabetes. ?  ?I saw the patient on February 19, 2021 at that time she was experiencing chest discomfort as well as shortness of breath.  Given her risk factors I recommended patient undergo a coronary CTA as well as an echocardiogram. ? ?I saw the patient on May 15, 2021 at that time we discussed her testing results.  I increased her atorvastatin to 40 mg daily.  Continue aspirin 81 mg daily.   ? ?Today she tells me that she has been experiencing some fatigue, no shortness of breath.  She has not noticed that she is having increasing daytime somnolence.  She is concerned about this. ? ?Past Medical History:  ?Diagnosis Date  ? Arthritis   ? Diabetes mellitus without complication ([place])   ? Graves disease 04/14/2020  ? Graves disease 07/20/2020  ? Graves disease   ? Hypertension   ? Mixed hyperlipidemia 02/20/2021  ? ? ?Past Surgical History:  ?Procedure Laterality Date  ? CHOLECYSTECTOMY    ? HERNIA REPAIR    ? OTHER SURGICAL HISTORY    ? scar tissue removal from bowels  ? PARTIAL HYSTERECTOMY    ? Still has ovaries  ? UTERINE FIBROID SURGERY    ? ? ?Current Medications: ?Current Meds  ?Medication Sig  ? Accu-Chek Softclix Lancets lancets Use to check blood sugar once day.  Dx code: E11.9  ? Alpha-Lipoic Acid 300 MG CAPS Take 2 capsules by mouth daily.  ? amLODipine (NORVASC) 5 MG tablet TAKE 1 AND 1/2 TABLETS BY  MOUTH DAILY  ? aspirin 81 MG tablet Take 81 mg by  mouth daily.  ? atorvastatin (LIPITOR) 40 MG tablet Take 1 tablet (40 mg total) by mouth daily.  ? carvedilol (COREG) 12.5 MG tablet Take 1 tablet (12.5 mg total) by mouth 2 (two) times daily.  ? cloNIDine (CATAPRES) 0.2 MG tablet TAKE 1 TABLET BY MOUTH 3  TIMES DAILY  ? Cobalamin Combinations (B-12) [phone] MCG SUBL B-12 Plus 5,000 mcg-100 mcg sublingual tablet  ? diclofenac Sodium (VOLTAREN) 1 % GEL Apply 4 g topically 4 (four) times daily. (Patient taking differently: Apply 4 g topically as needed.)  ? ferrous sulfate 325 (65 FE) MG tablet Take 325 mg by mouth daily with breakfast.  ? gabapentin (NEURONTIN) 400 MG capsule TAKE 2 CAPSULES BY MOUTH 3  TIMES DAILY  ? glipiZIDE (GLUCOTROL) 5 MG tablet TAKE 1 TABLET BY MOUTH  DAILY BEFORE BREAKFAST  ? glucose blood (ACCU-CHEK GUIDE) test strip Use to check blood sugar once day.  Dx code: E11.9  ? hydrochlorothiazide (HYDRODIURIL) 25 MG tablet TAKE 1 TABLET BY MOUTH  DAILY  ? irbesartan (AVAPRO) 300 MG tablet TAKE 1 TABLET BY MOUTH  DAILY  ? metFORMIN (GLUCOPHAGE) 1000 MG tablet Take 1 tablet (1,000 mg total) by mouth 2 (two) times daily with a meal.  ? methimazole (TAPAZOLE) 5 MG tablet Take 2.5 mg by mouth daily.  ? methocarbamol (ROBAXIN) 500 MG tablet  Take 1 tablet (500 mg total) by mouth every 8 (eight) hours as needed for muscle spasms.  ? Multiple Vitamin (MULTIVITAMIN ADULT PO) Take 1 tablet by mouth daily.  ? nystatin cream (MYCOSTATIN) Apply 1 application topically 2 (two) times daily. (Patient taking differently: Apply 1 application. topically 2 (two) times daily as needed.)  ? OZEMPIC, 0.25 OR 0.5 MG/DOSE, 2 MG/3ML SOPN   ? potassium chloride SA (KLOR-CON) 20 MEQ tablet TAKE 1 TABLET BY MOUTH  DAILY  ? TURMERIC PO Take 1 tablet by mouth daily.  ? UNABLE TO FIND daily. Med Name: Nyoka Cowden Lipped Muscle oil GLX3  ? vitamin C (ASCORBIC ACID) 500 MG tablet Take 500 mg by mouth daily.  ? [DISCONTINUED] furosemide (LASIX) 40 MG tablet Take 1 tablet (40 mg total) by  mouth daily.  ?  ? ?Allergies:   Patient has no known allergies.  ? ?Social History  ? ?Socioeconomic History  ? Marital status: Married  ?  Spouse name: [name]  ? Number of children: 0  ? Years of education: 31  ? Highest education level: Not on file  ?Occupational History  ? Occupation: customer service  ?Tobacco Use  ? Smoking status: Never  ? Smokeless tobacco: Never  ?Vaping Use  ? Vaping Use: Never used  ?Substance and Sexual Activity  ? Alcohol use: No  ?  Alcohol/week: 0.0 standard drinks  ?  Comment: rarely  ? Drug use: No  ? Sexual activity: Not Currently  ?  Birth control/protection: Post-menopausal  ?Other Topics Concern  ? Not on file  ?Social History Narrative  ? Lives with husband  ? Caffeine use: 16oz coffee per day  ? ?Social Determinants of Health  ? ?Financial Resource Strain: Medium Risk  ? Difficulty of Paying Living Expenses: Somewhat hard  ?Food Insecurity: Food Insecurity Present  ? Worried About Charity fundraiser in the Last Year: Sometimes true  ? Ran Out of Food in the Last Year: Never true  ?Transportation Needs: Unmet Transportation Needs  ? Lack of Transportation (Medical): Yes  ? Lack of Transportation (Non-Medical): No  ?Physical Activity: Inactive  ? Days of Exercise per Week: 0 days  ? Minutes of Exercise per Session: 0 min  ?Stress: No Stress Concern Present  ? Feeling of Stress : Not at all  ?Social Connections: Not on file  ?  ? ?Family History: ?The patient's family history includes Breast cancer in her cousin; Cancer in her sister; Diabetes in her maternal aunt and maternal uncle; Hyperlipidemia in her maternal aunt and mother. ? ?ROS:   ?Review of Systems  ?Constitution: Negative for decreased appetite, fever and weight gain.  ?HENT: Negative for congestion, ear discharge, hoarse voice and sore throat.   ?Eyes: Negative for discharge, redness, vision loss in right eye and visual halos.  ?Cardiovascular: Negative for chest pain, dyspnea on exertion, leg swelling, orthopnea and  palpitations.  ?Respiratory: Negative for cough, hemoptysis, shortness of breath and snoring.   ?Endocrine: Negative for heat intolerance and polyphagia.  ?Hematologic/Lymphatic: Negative for bleeding problem. Does not bruise/bleed easily.  ?Skin: Negative for flushing, nail changes, rash and suspicious lesions.  ?Musculoskeletal: Negative for arthritis, joint pain, muscle cramps, myalgias, neck pain and stiffness.  ?Gastrointestinal: Negative for abdominal pain, bowel incontinence, diarrhea and excessive appetite.  ?Genitourinary: Negative for decreased libido, genital sores and incomplete emptying.  ?Neurological: Negative for brief paralysis, focal weakness, headaches and loss of balance.  ?Psychiatric/Behavioral: Negative for altered mental status, depression and suicidal ideas.  ?Allergic/Immunologic:  Negative for HIV exposure and persistent infections.  ? ? ?EKGs/Labs/Other Studies Reviewed:   ? ?The following studies were reviewed today: ? ? ?EKG:  The ekg ordered today demonstrates  ? ?CCTA March 03, 2021 ?FINDINGS: ?Image quality: Poor quality study due to early trigger of the ?contrast bolus. The contrast bolus is largely timed for the right ?ventricular with significant SVC enhancement. ?  ?Noise artifact is: Moderate signal to noise artifact due to poor ?contrast bolus timing. ?  ?Coronary Arteries:  Normal coronary origin.  Right dominance. ?  ?Left main: The left main is a large caliber vessel with a normal ?take off from the left coronary cusp that bifurcates to form a left ?anterior descending artery and a left circumflex artery. ?  ?Left anterior descending artery: The proximal LAD contains minimal ?calcified plaque (<25%). The mid and distal segments appear patent. ?The first diagonal branch contains minimal calcified plaque (<25%). ?  ?Left circumflex artery: The LCX is non-dominant and patent with no ?evidence of plaque or stenosis. The LCX gives off 3 patent obtuse ?marginal branches. ?  ?Right  coronary artery: The RCA is dominant with normal take off from ?the right coronary cusp. The ostial RCA contains mild mixed density ?plaque (25-49%). The proximal segment contains minimal calcified ?pla

## 2021-11-17 ENCOUNTER — Encounter: Payer: Self-pay | Admitting: Cardiology

## 2021-11-17 ENCOUNTER — Other Ambulatory Visit (HOSPITAL_COMMUNITY): Payer: Medicare Other

## 2021-11-18 MED ORDER — FUROSEMIDE 40 MG PO TABS: 40.0000 mg | ORAL_TABLET | ORAL | 3 refills | Status: DC

## 2021-11-28 ENCOUNTER — Ambulatory Visit (HOSPITAL_COMMUNITY): Payer: Medicare Other | Attending: Cardiology

## 2021-11-28 DIAGNOSIS — I071 Rheumatic tricuspid insufficiency: Secondary | ICD-10-CM | POA: Diagnosis not present

## 2021-11-28 LAB — ECHOCARDIOGRAM COMPLETE
Area-P 1/2: 5.34 cm2
S' Lateral: 2.3 cm

## 2021-11-28 MED ORDER — PERFLUTREN LIPID MICROSPHERE
1.0000 mL | INTRAVENOUS | Status: AC | PRN
  Administered 2021-11-28: 3 mL via INTRAVENOUS

## 2021-12-10 ENCOUNTER — Ambulatory Visit (HOSPITAL_BASED_OUTPATIENT_CLINIC_OR_DEPARTMENT_OTHER): Payer: Medicare Other | Attending: Cardiology | Admitting: Cardiovascular Disease

## 2021-12-10 DIAGNOSIS — E669 Obesity, unspecified: Secondary | ICD-10-CM | POA: Insufficient documentation

## 2021-12-10 DIAGNOSIS — I10 Essential (primary) hypertension: Secondary | ICD-10-CM | POA: Insufficient documentation

## 2021-12-10 DIAGNOSIS — R400 Somnolence: Secondary | ICD-10-CM | POA: Diagnosis not present

## 2021-12-10 DIAGNOSIS — G4733 Obstructive sleep apnea (adult) (pediatric): Secondary | ICD-10-CM | POA: Diagnosis not present

## 2021-12-10 DIAGNOSIS — E119 Type 2 diabetes mellitus without complications: Secondary | ICD-10-CM | POA: Insufficient documentation

## 2021-12-10 DIAGNOSIS — R0683 Snoring: Secondary | ICD-10-CM | POA: Diagnosis not present

## 2021-12-14 ENCOUNTER — Other Ambulatory Visit: Payer: Self-pay | Admitting: Family Medicine

## 2021-12-14 ENCOUNTER — Other Ambulatory Visit: Payer: Self-pay | Admitting: Cardiology

## 2021-12-14 DIAGNOSIS — E119 Type 2 diabetes mellitus without complications: Secondary | ICD-10-CM

## 2021-12-21 ENCOUNTER — Encounter (HOSPITAL_BASED_OUTPATIENT_CLINIC_OR_DEPARTMENT_OTHER): Payer: Self-pay | Admitting: Cardiovascular Disease

## 2021-12-21 NOTE — Procedures (Signed)
Patient Name: Emily Wood, Emily Wood Date: 12/10/2021 Gender: Female D.O.B: 1951-02-28 Age (years): 70 Referring Provider: Godfrey Pick Tobb DO Height (inches): 66 Interpreting Physician: Shelva Majestic MD, ABSM Weight (lbs): 231 RPSGT: Carolin Coy BMI: 38 MRN: [id number] Neck Size: 13.00  CLINICAL INFORMATION Sleep Study Type: NPSG  Indication for sleep study: Diabetes, Excessive Daytime Sleepiness, Hypertension, Obesity, Snoring  Epworth Sleepiness Score: 5  SLEEP STUDY TECHNIQUE As per the AASM Manual for the Scoring of Sleep and Associated Events v2.3 (April 2016) with a hypopnea requiring 4% desaturations.  The channels recorded and monitored were frontal, central and occipital EEG, electrooculogram (EOG), submentalis EMG (chin), nasal and oral airflow, thoracic and abdominal wall motion, anterior tibialis EMG, snore microphone, electrocardiogram, and pulse oximetry.  MEDICATIONS Alpha-Lipoic Acid 300 MG CAPS amLODipine (NORVASC) 5 MG tablet aspirin 81 MG tablet atorvastatin (LIPITOR) 40 MG tablet (Expired) carvedilol (COREG) 12.5 MG tablet cloNIDine (CATAPRES) 0.2 MG tablet Cobalamin Combinations (B-12) [phone] MCG SUBL diclofenac Sodium (VOLTAREN) 1 % GEL ferrous sulfate 325 (65 FE) MG tablet furosemide (LASIX) 40 MG tablet gabapentin (NEURONTIN) 400 MG capsule glipiZIDE (GLUCOTROL) 5 MG tablet glucose blood (ACCU-CHEK GUIDE) test strip hydrochlorothiazide (HYDRODIURIL) 25 MG tablet irbesartan (AVAPRO) 300 MG tablet metFORMIN (GLUCOPHAGE) 1000 MG tablet methimazole (TAPAZOLE) 5 MG tablet methocarbamol (ROBAXIN) 500 MG tablet Multiple Vitamin (MULTIVITAMIN ADULT PO) nystatin cream (MYCOSTATIN) OZEMPIC, 0.25 OR 0.5 MG/DOSE, 2 MG/3ML SOPN potassium chloride SA (KLOR-CON) 20 MEQ tablet TURMERIC PO UNABLE TO FIND vitamin C (ASCORBIC ACID) 500 MG tablet  Medications self-administered by patient taken the night of the study : METFORMIN, CLONIDIPINE,  CARVEDILOL, ALPHA-LIPOIC ACID  SLEEP ARCHITECTURE The study was initiated at 11:08:32 PM and ended at 5:11:27 AM.  Sleep onset time was 97.4 minutes and the sleep efficiency was 47.0%%. The total sleep time was 170.5 minutes.  Stage REM latency was N/A minutes.  The patient spent 10.0%% of the night in stage N1 sleep, 90.0%% in stage N2 sleep, 0.0%% in stage N3 and 0% in REM.  Alpha intrusion was absent.  Supine sleep was 0.74%.  RESPIRATORY PARAMETERS The overall apnea/hypopnea index (AHI) was 1.4 per hour. The respirtory disturbance index (RDI) was 5.6/h. There were 0 total apneas, including 0 obstructive, 0 central and 0 mixed apneas. There were 4 hypopneas and 12 RERAs.  The AHI during Stage REM sleep was N/A per hour.  AHI while supine was 0.0 per hour.  The mean oxygen saturation was 91.5%. The minimum SpO2 during sleep was 86.0%.  Moderate snoring was noted during this study.  CARDIAC DATA The 2 lead EKG demonstrated sinus rhythm. The mean heart rate was 69.3 beats per minute. Other EKG findings include: PVCs.  LEG MOVEMENT DATA The total PLMS were 0 with a resulting PLMS index of 0.0. Associated arousal with leg movement index was 0.0 .  IMPRESSIONS - No significant obstructive sleep apnea occurred during this study (AHI 1.4/h: RDI 5.6/h); however, there was minimal supine sleep and absent REM sleep which may allow for an underestimation of the severity of sleep disordered breathing. - Mild oxygen desaturation desaturation to a nadir of 86.0%. - The patient snored with moderate snoring volume. - Poor sleep efficiency at only 47%. - Abnormal sleep architecture with absent slow wave and REM sleep. - EKG findings include PVCs. - Clinically significant periodic limb movements did not occur during sleep. No significant associated arousals.  DIAGNOSIS - Nocturnal Hypoxemia (G47.36)  RECOMMENDATIONS - At present there is no indication for CPAP  therapy. - Effort should  be made to optimize nasal and orophayngeal patency. - Avoid alcohol, sedatives and other CNS depressants that may worsen sleep apnea and disrupt normal sleep architecture. - Sleep hygiene should be reviewed to assess factors that may improve sleep quality. - Weight management (BMI 38) and regular exercise should be initiated or continued if appropriate.  [Electronically signed] 12/21/2021 06:41 PM  Shelva Majestic MD, [facility], [place], American Board of Sleep Medicine  NPI: [id number]  [place] PH: [phone]   FX: [phone] [place]

## 2021-12-30 ENCOUNTER — Telehealth: Payer: Self-pay | Admitting: *Deleted

## 2021-12-30 NOTE — Telephone Encounter (Signed)
-----   Message from Troy Sine, MD sent at 12/21/2021  6:46 PM EDT ----- Emily Wood please notify patient of the results of the study.  At present no indication for CPAP.

## 2021-12-30 NOTE — Telephone Encounter (Signed)
Mychart message sent to patient with sleep study results.

## 2022-01-02 ENCOUNTER — Other Ambulatory Visit: Payer: Self-pay | Admitting: Family Medicine

## 2022-01-02 DIAGNOSIS — R609 Edema, unspecified: Secondary | ICD-10-CM

## 2022-01-02 DIAGNOSIS — I10 Essential (primary) hypertension: Secondary | ICD-10-CM

## 2022-01-02 DIAGNOSIS — R600 Localized edema: Secondary | ICD-10-CM

## 2022-01-06 DIAGNOSIS — E119 Type 2 diabetes mellitus without complications: Secondary | ICD-10-CM | POA: Diagnosis not present

## 2022-01-07 DIAGNOSIS — M1711 Unilateral primary osteoarthritis, right knee: Secondary | ICD-10-CM | POA: Diagnosis not present

## 2022-01-13 DIAGNOSIS — E0500 Thyrotoxicosis with diffuse goiter without thyrotoxic crisis or storm: Secondary | ICD-10-CM | POA: Diagnosis not present

## 2022-01-13 DIAGNOSIS — E1122 Type 2 diabetes mellitus with diabetic chronic kidney disease: Secondary | ICD-10-CM | POA: Diagnosis not present

## 2022-01-13 DIAGNOSIS — N1831 Chronic kidney disease, stage 3a: Secondary | ICD-10-CM | POA: Diagnosis not present

## 2022-01-13 DIAGNOSIS — E1169 Type 2 diabetes mellitus with other specified complication: Secondary | ICD-10-CM | POA: Diagnosis not present

## 2022-01-31 ENCOUNTER — Other Ambulatory Visit: Payer: Self-pay | Admitting: Family Medicine

## 2022-01-31 DIAGNOSIS — I10 Essential (primary) hypertension: Secondary | ICD-10-CM

## 2022-02-03 DIAGNOSIS — M25512 Pain in left shoulder: Secondary | ICD-10-CM | POA: Diagnosis not present

## 2022-02-03 DIAGNOSIS — D1720 Benign lipomatous neoplasm of skin and subcutaneous tissue of unspecified limb: Secondary | ICD-10-CM | POA: Diagnosis not present

## 2022-02-18 DIAGNOSIS — E119 Type 2 diabetes mellitus without complications: Secondary | ICD-10-CM | POA: Diagnosis not present

## 2022-02-18 DIAGNOSIS — H2513 Age-related nuclear cataract, bilateral: Secondary | ICD-10-CM | POA: Diagnosis not present

## 2022-02-18 DIAGNOSIS — H43813 Vitreous degeneration, bilateral: Secondary | ICD-10-CM | POA: Diagnosis not present

## 2022-02-18 LAB — HM DIABETES EYE EXAM

## 2022-02-25 DIAGNOSIS — I890 Lymphedema, not elsewhere classified: Secondary | ICD-10-CM | POA: Diagnosis not present

## 2022-02-25 DIAGNOSIS — M1711 Unilateral primary osteoarthritis, right knee: Secondary | ICD-10-CM | POA: Diagnosis not present

## 2022-02-25 DIAGNOSIS — M25561 Pain in right knee: Secondary | ICD-10-CM | POA: Diagnosis not present

## 2022-02-25 DIAGNOSIS — M25562 Pain in left knee: Secondary | ICD-10-CM | POA: Diagnosis not present

## 2022-03-02 ENCOUNTER — Other Ambulatory Visit: Payer: Self-pay | Admitting: Cardiology

## 2022-03-03 NOTE — Telephone Encounter (Signed)
Rx refill sent to pharmacy. 

## 2022-03-06 ENCOUNTER — Other Ambulatory Visit: Payer: Self-pay | Admitting: Urology

## 2022-03-06 ENCOUNTER — Ambulatory Visit: Payer: Medicare Other | Admitting: Cardiology

## 2022-03-06 ENCOUNTER — Encounter: Payer: Self-pay | Admitting: Cardiology

## 2022-03-06 VITALS — BP 144/70 | HR 62 | Ht 65.5 in | Wt 222.6 lb

## 2022-03-06 DIAGNOSIS — I2510 Atherosclerotic heart disease of native coronary artery without angina pectoris: Secondary | ICD-10-CM | POA: Diagnosis not present

## 2022-03-06 DIAGNOSIS — R609 Edema, unspecified: Secondary | ICD-10-CM | POA: Diagnosis not present

## 2022-03-06 DIAGNOSIS — N281 Cyst of kidney, acquired: Secondary | ICD-10-CM

## 2022-03-06 DIAGNOSIS — I10 Essential (primary) hypertension: Secondary | ICD-10-CM

## 2022-03-06 DIAGNOSIS — Z79899 Other long term (current) drug therapy: Secondary | ICD-10-CM | POA: Diagnosis not present

## 2022-03-06 DIAGNOSIS — E782 Mixed hyperlipidemia: Secondary | ICD-10-CM | POA: Diagnosis not present

## 2022-03-06 LAB — COMPREHENSIVE METABOLIC PANEL WITH GFR
ALT: 14 [IU]/L (ref 0–32)
AST: 19 [IU]/L (ref 0–40)
Albumin/Globulin Ratio: 1.2 (ref 1.2–2.2)
Albumin: 4.4 g/dL (ref 3.9–4.9)
Alkaline Phosphatase: 94 [IU]/L (ref 44–121)
BUN/Creatinine Ratio: 18 (ref 12–28)
BUN: 23 mg/dL (ref 8–27)
Bilirubin Total: 1 mg/dL (ref 0.0–1.2)
CO2: 25 mmol/L (ref 20–29)
Calcium: 9.7 mg/dL (ref 8.7–10.3)
Chloride: 100 mmol/L (ref 96–106)
Creatinine, Ser: 1.3 mg/dL — ABNORMAL HIGH (ref 0.57–1.00)
Globulin, Total: 3.7 g/dL (ref 1.5–4.5)
Glucose: 112 mg/dL — ABNORMAL HIGH (ref 70–99)
Potassium: 4 mmol/L (ref 3.5–5.2)
Sodium: 139 mmol/L (ref 134–144)
Total Protein: 8.1 g/dL (ref 6.0–8.5)
eGFR: 44 mL/min/{1.73_m2} — ABNORMAL LOW

## 2022-03-06 LAB — MAGNESIUM: Magnesium: 1.5 mg/dL — ABNORMAL LOW (ref 1.6–2.3)

## 2022-03-06 MED ORDER — ISOSORBIDE MONONITRATE ER 30 MG PO TB24: 30.0000 mg | ORAL_TABLET | Freq: Every day | ORAL | 3 refills | Status: DC

## 2022-03-06 MED ORDER — FUROSEMIDE 40 MG PO TABS: 40.0000 mg | ORAL_TABLET | ORAL | 3 refills | Status: DC

## 2022-03-06 NOTE — Progress Notes (Unsigned)
Cardiology Office Note:    Date:  03/08/2022   ID:  MERRIN MCVICKER, DOB 1951/06/23, MRN [id number]  PCP:  Darreld Mclean, MD  Cardiologist:  Berniece Salines, DO  Electrophysiologist:  None   Referring MD: Darreld Mclean, MD   "I am having some chest pain"  History of Present Illness:    Emily Wood is a 71 y.o. female with a hx of coronary artery disease seen on her coronary CTA, mild to moderate tricuspid regurgitation, pulmonary hypertension, chronic kidney disease, Graves' disease, hyperlipidemia, type 2 diabetes.   I saw the patient on February 19, 2021 at that time she was experiencing chest discomfort as well as shortness of breath.  Given her risk factors I recommended patient undergo a coronary CTA as well as an echocardiogram.   I saw the patient on May 15, 2021 at that time we discussed her testing results.  I increased her atorvastatin to 40 mg daily.  Continue aspirin 81 mg daily.    I saw the patient on November 04, 2021 at that time we talked about getting a sleep study and repeating her echocardiogram for her tricuspid regurgitation.  I also started the patient on cautious Lasix.  We reviewed all of her lipid profile she was at goal.  Today she notes that she has been experiencing some chest discomfort.  She describes it as a midsternal chest discomfort that comes off and off.  Nothing makes it better or worse.  She tells me that it feels as if she is having gas.  But sometimes when she take 2 Tums she will have transient relief and this comes right back on.  She also reports that she has been experiencing intermittent dry mouth.  In the last couple of weeks.  Past Medical History:  Diagnosis Date   Arthritis    Diabetes mellitus without complication ([place])    Graves disease 04/14/2020   Graves disease 07/20/2020   Graves disease    Hypertension    Mixed hyperlipidemia 02/20/2021    Past Surgical History:  Procedure Laterality Date   CHOLECYSTECTOMY      HERNIA REPAIR     OTHER SURGICAL HISTORY     scar tissue removal from bowels   PARTIAL HYSTERECTOMY     Still has ovaries   UTERINE FIBROID SURGERY      Current Medications: Current Meds  Medication Sig   Alpha-Lipoic Acid 300 MG CAPS Take 2 capsules by mouth daily.   amLODipine (NORVASC) 5 MG tablet TAKE 1 AND 1/2 TABLETS BY  MOUTH DAILY   aspirin 81 MG tablet Take 81 mg by mouth daily.   atorvastatin (LIPITOR) 40 MG tablet TAKE 1 TABLET BY MOUTH DAILY   carvedilol (COREG) 12.5 MG tablet TAKE 1 TABLET BY MOUTH  TWICE DAILY   Cholecalciferol (VITAMIN D3 PO) Take by mouth daily.   cloNIDine (CATAPRES) 0.2 MG tablet TAKE 1 TABLET BY MOUTH 3  TIMES DAILY   Cobalamin Combinations (B-12) [phone] MCG SUBL B-12 Plus 5,000 mcg-100 mcg sublingual tablet   diclofenac Sodium (VOLTAREN) 1 % GEL Apply 4 g topically 4 (four) times daily. (Patient taking differently: Apply 4 g topically as needed.)   ferrous sulfate 325 (65 FE) MG tablet Take 325 mg by mouth daily with breakfast.   gabapentin (NEURONTIN) 400 MG capsule TAKE 2 CAPSULES BY MOUTH 3  TIMES DAILY   hydrochlorothiazide (HYDRODIURIL) 25 MG tablet TAKE 1 TABLET BY MOUTH  DAILY   irbesartan (AVAPRO) 300 MG  tablet TAKE 1 TABLET BY MOUTH DAILY   isosorbide mononitrate (IMDUR) 30 MG 24 hr tablet Take 1 tablet (30 mg total) by mouth daily.   metFORMIN (GLUCOPHAGE) 1000 MG tablet TAKE 1 TABLET BY MOUTH TWICE  DAILY WITH MEALS   methimazole (TAPAZOLE) 5 MG tablet Take 2.5 mg by mouth daily.   methocarbamol (ROBAXIN) 500 MG tablet Take 1 tablet (500 mg total) by mouth every 8 (eight) hours as needed for muscle spasms.   Multiple Vitamin (MULTIVITAMIN ADULT PO) Take 1 tablet by mouth daily.   nystatin cream (MYCOSTATIN) Apply 1 application topically 2 (two) times daily. (Patient taking differently: Apply 1 application  topically 2 (two) times daily as needed.)   OZEMPIC, 0.25 OR 0.5 MG/DOSE, 2 MG/3ML SOPN    potassium chloride SA (KLOR-CON M) 20  MEQ tablet TAKE 1 TABLET BY MOUTH  DAILY   TURMERIC PO Take 1 tablet by mouth daily.   UNABLE TO FIND daily. Med Name: Nyoka Cowden Lipped Muscle oil GLX3   vitamin C (ASCORBIC ACID) 500 MG tablet Take 500 mg by mouth daily.   [DISCONTINUED] furosemide (LASIX) 40 MG tablet Take 1 tablet (40 mg total) by mouth once a week.     Allergies:   Patient has no known allergies.   Social History   Socioeconomic History   Marital status: Married    Spouse name: [name]   Number of children: 0   Years of education: 16   Highest education level: Not on file  Occupational History   Occupation: customer service  Tobacco Use   Smoking status: Never   Smokeless tobacco: Never  Vaping Use   Vaping Use: Never used  Substance and Sexual Activity   Alcohol use: No    Alcohol/week: 0.0 standard drinks of alcohol    Comment: rarely   Drug use: No   Sexual activity: Not Currently    Birth control/protection: Post-menopausal  Other Topics Concern   Not on file  Social History Narrative   Lives with husband   Caffeine use: 16oz coffee per day   Social Determinants of Health   Financial Resource Strain: Medium Risk (06/06/2021)   Overall Financial Resource Strain (CARDIA)    Difficulty of Paying Living Expenses: Somewhat hard  Food Insecurity: Food Insecurity Present (06/06/2021)   Hunger Vital Sign    Worried About Running Out of Food in the Last Year: Sometimes true    Ran Out of Food in the Last Year: Never true  Transportation Needs: Unmet Transportation Needs (06/06/2021)   PRAPARE - Hydrologist (Medical): Yes    Lack of Transportation (Non-Medical): No  Physical Activity: Inactive (06/06/2021)   Exercise Vital Sign    Days of Exercise per Week: 0 days    Minutes of Exercise per Session: 0 min  Stress: No Stress Concern Present (06/06/2021)   [place]    Feeling of Stress : Not at all  Social  Connections: Not on file     Family History: The patient's family history includes Breast cancer in her cousin; Cancer in her sister; Diabetes in her maternal aunt and maternal uncle; Hyperlipidemia in her maternal aunt and mother.  ROS:   Review of Systems  Constitution: Negative for decreased appetite, fever and weight gain.  HENT: Negative for congestion, ear discharge, hoarse voice and sore throat.   Eyes: Negative for discharge, redness, vision loss in right eye and visual halos.  Cardiovascular: Negative  for chest pain, dyspnea on exertion, leg swelling, orthopnea and palpitations.  Respiratory: Negative for cough, hemoptysis, shortness of breath and snoring.   Endocrine: Negative for heat intolerance and polyphagia.  Hematologic/Lymphatic: Negative for bleeding problem. Does not bruise/bleed easily.  Skin: Negative for flushing, nail changes, rash and suspicious lesions.  Musculoskeletal: Negative for arthritis, joint pain, muscle cramps, myalgias, neck pain and stiffness.  Gastrointestinal: Negative for abdominal pain, bowel incontinence, diarrhea and excessive appetite.  Genitourinary: Negative for decreased libido, genital sores and incomplete emptying.  Neurological: Negative for brief paralysis, focal weakness, headaches and loss of balance.  Psychiatric/Behavioral: Negative for altered mental status, depression and suicidal ideas.  Allergic/Immunologic: Negative for HIV exposure and persistent infections.    EKGs/Labs/Other Studies Reviewed:    The following studies were reviewed today:   EKG:  The ekg ordered today demonstrates sinus rhythm, heart rate 62 bpm with evidence suggesting old inferior infarction compared to prior EKG no significant change.  TTE 11/28/2021 IMPRESSIONS     1. Left ventricular ejection fraction, by estimation, is 65 to 70%. The  left ventricle has normal function. The left ventricle has no regional  wall motion abnormalities. Left  ventricular diastolic parameters are  consistent with Grade II diastolic  dysfunction (pseudonormalization). Elevated left atrial pressure.   2. Right ventricular systolic function is normal. The right ventricular  size is mildly enlarged. There is moderately elevated pulmonary artery  systolic pressure. The estimated right ventricular systolic pressure is  23.5 mmHg.   3. The mitral valve is normal in structure. Trivial mitral valve  regurgitation. No evidence of mitral stenosis.   4. Tricuspid valve regurgitation is mild to moderate.   5. The aortic valve was not well visualized. Aortic valve regurgitation  is not visualized. Aortic valve sclerosis is present, with no evidence of  aortic valve stenosis.   6. The inferior vena cava is normal in size with <50% respiratory  variability, suggesting right atrial pressure of 8 mmHg.   FINDINGS   Left Ventricle: Left ventricular ejection fraction, by estimation, is 65  to 70%. The left ventricle has normal function. The left ventricle has no  regional wall motion abnormalities. The left ventricular internal cavity  size was normal in size. There is   no left ventricular hypertrophy. Left ventricular diastolic parameters  are consistent with Grade II diastolic dysfunction (pseudonormalization).  Elevated left atrial pressure.   Right Ventricle: The right ventricular size is mildly enlarged. No  increase in right ventricular wall thickness. Right ventricular systolic  function is normal. There is moderately elevated pulmonary artery systolic  pressure. The tricuspid regurgitant  velocity is 3.46 m/s, and with an assumed right atrial pressure of 8 mmHg,  the estimated right ventricular systolic pressure is 36.1 mmHg.   Left Atrium: Left atrial size was normal in size.   Right Atrium: Right atrial size was normal in size.   Pericardium: Trivial pericardial effusion is present.   Mitral Valve: The mitral valve is normal in structure.  Trivial mitral  valve regurgitation. No evidence of mitral valve stenosis.   Tricuspid Valve: The tricuspid valve is normal in structure. Tricuspid  valve regurgitation is mild to moderate.   Aortic Valve: The aortic valve was not well visualized. Aortic valve  regurgitation is not visualized. Aortic valve sclerosis is present, with  no evidence of aortic valve stenosis.   Pulmonic Valve: The pulmonic valve was not well visualized. Pulmonic valve  regurgitation is not visualized.   Aorta: The aortic root  and ascending aorta are structurally normal, with  no evidence of dilitation.   Venous: The inferior vena cava is normal in size with less than 50%  respiratory variability, suggesting right atrial pressure of 8 mmHg.   IAS/Shunts: The interatrial septum was not well visualized.    Medication Adjustments/Labs and Tests Ordered: Current medicines are reviewed at length with the patient today.  Concerns regarding medicines are outlined above.  Tests Ordered: Orders Placed This Encounter  Procedures   Comprehensive Metabolic Panel (CMET)   Magnesium   EKG 12-Lead   Medication Changes: Meds ordered this encounter  Medications   isosorbide mononitrate (IMDUR) 30 MG 24 hr tablet    Sig: Take 1 tablet (30 mg total) by mouth daily.    Dispense:  90 tablet    Refill:  3   furosemide (LASIX) 40 MG tablet    Sig: Take 1 tablet (40 mg total) by mouth once a week.    Dispense:  13 tablet    Refill:  3    CCTA March 03, 2021 FINDINGS: Image quality: Poor quality study due to early trigger of the contrast bolus. The contrast bolus is largely timed for the right ventricular with significant SVC enhancement.   Noise artifact is: Moderate signal to noise artifact due to poor contrast bolus timing.   Coronary Arteries:  Normal coronary origin.  Right dominance.   Left main: The left main is a large caliber vessel with a normal take off from the left coronary cusp that bifurcates  to form a left anterior descending artery and a left circumflex artery.   Left anterior descending artery: The proximal LAD contains minimal calcified plaque (<25%). The mid and distal segments appear patent. The first diagonal branch contains minimal calcified plaque (<25%).   Left circumflex artery: The LCX is non-dominant and patent with no evidence of plaque or stenosis. The LCX gives off 3 patent obtuse marginal branches.   Right coronary artery: The RCA is dominant with normal take off from the right coronary cusp. The ostial RCA contains mild mixed density plaque (25-49%). The proximal segment contains minimal calcified plaque (<25%). The mid segment is patent. The distal RCA contains minimal calcified plaque (<25%). The RCA terminates as a PDA and right posterolateral branch without evidence of plaque or stenosis.   Right Atrium: Right atrial size is within normal limits.   Right Ventricle: The right ventricular cavity is within normal limits.   Left Atrium: Left atrial size is normal in size with no left atrial appendage filling defect.   Left Ventricle: The ventricular cavity size is within normal limits. There are no stigmata of prior infarction. There is no abnormal filling defect.   Pulmonary arteries: Normal in size without proximal filling defect.   Pulmonary veins: Normal pulmonary venous drainage.   Pericardium: Normal thickness with no significant effusion or calcium present.   Cardiac valves: The aortic valve is trileaflet without significant calcification. The mitral valve is normal structure with minimal annular calcium.   Aorta: Normal caliber with no significant disease.   Extra-cardiac findings: See attached radiology report for non-cardiac structures.   IMPRESSION: 1. Poor quality study due to early trigger of the contrast bolus. The contrast bolus is largely timed for the right ventricular with significant SVC enhancement. Study remains  interpretable.   2. Coronary calcium score of 418. This was 93rd percentile for age-, sex, and race-matched controls.   3. Normal coronary origin with right dominance.   4. Mild mixed density  plaque (25-49%) in the ostial RCA.   5. Minimal calcified plaque (<25%) in the proximal LAD.   RECOMMENDATIONS: 1. Mild non-obstructive CAD (25-49%) in the ostial RCA. CT FFR will be sent due to ostial location. Consider preventive therapy and risk factor modification.   Eleonore Chiquito, MD   Echo August 2022 FINDINGS   Left Ventricle: Left ventricular ejection fraction, by estimation, is 60  to 65%. The left ventricle has normal function. The left ventricle has no  regional wall motion abnormalities. Definity contrast agent was given IV  to delineate the left ventricular   endocardial borders. The left ventricular internal cavity size was normal  in size. There is no left ventricular hypertrophy. Left ventricular  diastolic parameters are consistent with Grade II diastolic dysfunction  (pseudonormalization). Elevated left  atrial pressure.   Right Ventricle: The right ventricular size is normal. No increase in  right ventricular wall thickness. Right ventricular systolic function is  normal. There is moderately elevated pulmonary artery systolic pressure.  The tricuspid regurgitant velocity is  3.35 m/s, and with an assumed right atrial pressure of 8 mmHg, the  estimated right ventricular systolic pressure is 53.6 mmHg.   Left Atrium: Left atrial size was mildly dilated.   Right Atrium: Right atrial size was normal in size.   Pericardium: There is no evidence of pericardial effusion.   Mitral Valve: The mitral valve is normal in structure. Trivial mitral  valve regurgitation. No evidence of mitral valve stenosis.   Tricuspid Valve: The tricuspid valve is normal in structure. Tricuspid  valve regurgitation is mild to moderate. No evidence of tricuspid  stenosis.   Aortic Valve:  The aortic valve is normal in structure. Aortic valve  regurgitation is not visualized. No aortic stenosis is present.   Pulmonic Valve: The pulmonic valve was normal in structure. Pulmonic valve  regurgitation is not visualized. No evidence of pulmonic stenosis.   Aorta: The aortic root is normal in size and structure.   Venous: The inferior vena cava is normal in size with greater than 50%  respiratory variability, suggesting right atrial pressure of 3 mmHg.   IAS/Shunts: No atrial level shunt detected by color flow Doppler.       Recent Labs: 03/06/2022: ALT 14; BUN 23; Creatinine, Ser 1.30; Magnesium 1.5; Potassium 4.0; Sodium 139  Recent Lipid Panel    Component Value Date/Time   CHOL 129 07/02/2021 0944   TRIG 68 07/02/2021 0944   HDL 56 07/02/2021 0944   CHOLHDL 2.3 07/02/2021 0944   CHOLHDL 4 07/06/2019 0942   VLDL 25.8 07/06/2019 0942   LDLCALC 59 07/02/2021 0944    Physical Exam:    VS:  BP (!) 144/70   Pulse 62   Ht 5' 5.5" (1.664 m)   Wt 222 lb 9.6 oz (101 kg)   SpO2 96%   BMI 36.48 kg/m     Wt Readings from Last 3 Encounters:  03/06/22 222 lb 9.6 oz (101 kg)  12/10/21 231 lb (104.8 kg)  11/04/21 231 lb (104.8 kg)     GEN: Well nourished, well developed in no acute distress HEENT: Normal NECK: No JVD; No carotid bruits LYMPHATICS: No lymphadenopathy CARDIAC: S1S2 noted,RRR, no murmurs, rubs, gallops RESPIRATORY:  Clear to auscultation without rales, wheezing or rhonchi  ABDOMEN: Soft, non-tender, non-distended, +bowel sounds, no guarding. EXTREMITIES: No edema, No cyanosis, no clubbing MUSCULOSKELETAL:  No deformity  SKIN: Warm and dry NEUROLOGIC:  Alert and oriented x 3, non-focal PSYCHIATRIC:  Normal affect, good  insight  ASSESSMENT:    1. Coronary artery disease involving native heart, unspecified vessel or lesion type, unspecified whether angina present   2. Peripheral edema   3. Primary hypertension   4. Mixed hyperlipidemia   5.  Medication management    PLAN:     Coronary artery disease-symptoms concerning for angina, will start the patient on Imdur 30 mg daily.  We will also continue her aspirin 81 mg daily as well as her Lipitor.  Blood pressure is elevated slightly in the office.  Hopefully with adding the Imdur 30 mg daily will help with that as well.  Diabetes mellitus-this is being managed by the primary team.  Dry mouth-I encouraged the patient to increase hydration.  She also will discuss with her PCP.  The patient understands the need to lose weight with diet and exercise. We have discussed specific strategies for this.  The patient is in agreement with the above plan. The patient left the office in stable condition.  The patient will follow up in   Medication Adjustments/Labs and Tests Ordered: Current medicines are reviewed at length with the patient today.  Concerns regarding medicines are outlined above.  Orders Placed This Encounter  Procedures   Comprehensive Metabolic Panel (CMET)   Magnesium   EKG 12-Lead   Meds ordered this encounter  Medications   isosorbide mononitrate (IMDUR) 30 MG 24 hr tablet    Sig: Take 1 tablet (30 mg total) by mouth daily.    Dispense:  90 tablet    Refill:  3   furosemide (LASIX) 40 MG tablet    Sig: Take 1 tablet (40 mg total) by mouth once a week.    Dispense:  13 tablet    Refill:  3    Patient Instructions  Medication Instructions:  Your physician has recommended you make the following change in your medication:  START: Imdur 30 mg once daily *If you need a refill on your cardiac medications before your next appointment, please call your pharmacy*   Lab Work: TODAY: CMET, Mag If you have labs (blood work) drawn today and your tests are completely normal, you will receive your results only by: [place] (if you have MyChart) OR A paper copy in the mail If you have any lab test that is abnormal or we need to change your treatment, we will  call you to review the results.   Testing/Procedures: None   Follow-Up: At [facility], you and your health needs are our priority.  As part of our continuing mission to provide you with exceptional heart care, we have created designated Provider Care Teams.  These Care Teams include your primary Cardiologist (physician) and Advanced Practice Providers (APPs -  Physician Assistants and Nurse Practitioners) who all work together to provide you with the care you need, when you need it.  We recommend signing up for the patient portal called "MyChart".  Sign up information is provided on this After Visit Summary.  MyChart is used to connect with patients for Virtual Visits (Telemedicine).  Patients are able to view lab/test results, encounter notes, upcoming appointments, etc.  Non-urgent messages can be sent to your provider as well.   To learn more about what you can do with MyChart, go to [email or web address].    Your next appointment:   4 month(s)  The format for your next appointment:   In Person  Provider:   Berniece Salines, DO     Other Instructions   Important Information About Sugar  Adopting a Healthy Lifestyle.  Know what a healthy weight is for you (roughly BMI <25) and aim to maintain this   Aim for 7+ servings of fruits and vegetables daily   65-80+ fluid ounces of water or unsweet tea for healthy kidneys   Limit to max 1 drink of alcohol per day; avoid smoking/tobacco   Limit animal fats in diet for cholesterol and heart health - choose grass fed whenever available   Avoid highly processed foods, and foods high in saturated/trans fats   Aim for low stress - take time to unwind and care for your mental health   Aim for 150 min of moderate intensity exercise weekly for heart health, and weights twice weekly for bone health   Aim for 7-9 hours of sleep daily   When it comes to diets, agreement about the perfect plan isnt easy to find, even among  the experts. Experts at the [employer] developed an idea known as the Healthy Eating Plate. Just imagine a plate divided into logical, healthy portions.   The emphasis is on diet quality:   Load up on vegetables and fruits - one-half of your plate: Aim for color and variety, and remember that potatoes dont count.   Go for whole grains - one-quarter of your plate: Whole wheat, barley, wheat berries, quinoa, oats, brown rice, and foods made with them. If you want pasta, go with whole wheat pasta.   Protein power - one-quarter of your plate: Fish, chicken, beans, and nuts are all healthy, versatile protein sources. Limit red meat.   The diet, however, does go beyond the plate, offering a few other suggestions.   Use healthy plant oils, such as olive, canola, soy, corn, sunflower and peanut. Check the labels, and avoid partially hydrogenated oil, which have unhealthy trans fats.   If youre thirsty, drink water. Coffee and tea are good in moderation, but skip sugary drinks and limit milk and dairy products to one or two daily servings.   The type of carbohydrate in the diet is more important than the amount. Some sources of carbohydrates, such as vegetables, fruits, whole grains, and beans-are healthier than others.   Finally, stay active  Rolly Pancake, DO  03/08/2022 4:33 PM    [facility]

## 2022-03-06 NOTE — Patient Instructions (Signed)
Medication Instructions:  Your physician has recommended you make the following change in your medication:  START: Imdur 30 mg once daily *If you need a refill on your cardiac medications before your next appointment, please call your pharmacy*   Lab Work: TODAY: CMET, Mag If you have labs (blood work) drawn today and your tests are completely normal, you will receive your results only by: [place] (if you have MyChart) OR A paper copy in the mail If you have any lab test that is abnormal or we need to change your treatment, we will call you to review the results.   Testing/Procedures: None   Follow-Up: At [facility], you and your health needs are our priority.  As part of our continuing mission to provide you with exceptional heart care, we have created designated Provider Care Teams.  These Care Teams include your primary Cardiologist (physician) and Advanced Practice Providers (APPs -  Physician Assistants and Nurse Practitioners) who all work together to provide you with the care you need, when you need it.  We recommend signing up for the patient portal called "MyChart".  Sign up information is provided on this After Visit Summary.  MyChart is used to connect with patients for Virtual Visits (Telemedicine).  Patients are able to view lab/test results, encounter notes, upcoming appointments, etc.  Non-urgent messages can be sent to your provider as well.   To learn more about what you can do with MyChart, go to [email or web address].    Your next appointment:   4 month(s)  The format for your next appointment:   In Person  Provider:   Berniece Salines, DO     Other Instructions   Important Information About Sugar

## 2022-03-08 ENCOUNTER — Other Ambulatory Visit: Payer: Self-pay | Admitting: Family Medicine

## 2022-03-08 DIAGNOSIS — I10 Essential (primary) hypertension: Secondary | ICD-10-CM

## 2022-03-08 DIAGNOSIS — R609 Edema, unspecified: Secondary | ICD-10-CM

## 2022-03-13 ENCOUNTER — Other Ambulatory Visit: Payer: Self-pay

## 2022-03-13 DIAGNOSIS — N183 Chronic kidney disease, stage 3 unspecified: Secondary | ICD-10-CM

## 2022-03-16 DIAGNOSIS — H2511 Age-related nuclear cataract, right eye: Secondary | ICD-10-CM | POA: Diagnosis not present

## 2022-03-24 ENCOUNTER — Other Ambulatory Visit: Payer: Self-pay

## 2022-03-24 DIAGNOSIS — N183 Chronic kidney disease, stage 3 unspecified: Secondary | ICD-10-CM

## 2022-04-02 DIAGNOSIS — N183 Chronic kidney disease, stage 3 unspecified: Secondary | ICD-10-CM | POA: Diagnosis not present

## 2022-04-02 LAB — COMPREHENSIVE METABOLIC PANEL
ALT: 13 IU/L (ref 0–32)
AST: 21 IU/L (ref 0–40)
Albumin/Globulin Ratio: 1.3 (ref 1.2–2.2)
Albumin: 4.5 g/dL (ref 3.9–4.9)
Alkaline Phosphatase: 88 IU/L (ref 44–121)
BUN/Creatinine Ratio: 16 (ref 12–28)
BUN: 22 mg/dL (ref 8–27)
Bilirubin Total: 0.9 mg/dL (ref 0.0–1.2)
CO2: 22 mmol/L (ref 20–29)
Calcium: 9.8 mg/dL (ref 8.7–10.3)
Chloride: 97 mmol/L (ref 96–106)
Creatinine, Ser: 1.36 mg/dL — ABNORMAL HIGH (ref 0.57–1.00)
Globulin, Total: 3.5 g/dL (ref 1.5–4.5)
Glucose: 87 mg/dL (ref 70–99)
Potassium: 4.1 mmol/L (ref 3.5–5.2)
Sodium: 135 mmol/L (ref 134–144)
Total Protein: 8 g/dL (ref 6.0–8.5)
eGFR: 42 mL/min/{1.73_m2} — ABNORMAL LOW (ref >59)

## 2022-04-06 ENCOUNTER — Encounter: Payer: Self-pay | Admitting: Ophthalmology

## 2022-04-09 NOTE — Discharge Instructions (Signed)

## 2022-04-09 NOTE — Anesthesia Preprocedure Evaluation (Addendum)
Anesthesia Evaluation  Patient identified by MRN, date of birth, ID band Patient awake    Reviewed: Allergy & Precautions, NPO status , Patient's Chart, lab work & pertinent test results, reviewed documented beta blocker date and time   History of Anesthesia Complications Negative for: history of anesthetic complications  Airway Mallampati: II  TM Distance: >3 FB Neck ROM: full    Dental no notable dental hx.    Pulmonary neg pulmonary ROS, neg shortness of breath,    Pulmonary exam normal        Cardiovascular Exercise Tolerance: Good hypertension, Pt. on medications and Pt. on home beta blockers pulmonary hypertension+ CAD  (-) DOE Normal cardiovascular exam  ECHO 11/2021 1. Left ventricular ejection fraction, by estimation, is 65 to 70%. The  left ventricle has normal function. The left ventricle has no regional  wall motion abnormalities. Left ventricular diastolic parameters are  consistent with Grade II diastolic  dysfunction (pseudonormalization). Elevated left atrial pressure.  2. Right ventricular systolic function is normal. The right ventricular  size is mildly enlarged. There is moderately elevated pulmonary artery  systolic pressure. The estimated right ventricular systolic pressure is  10.3 mmHg.  3. The mitral valve is normal in structure. Trivial mitral valve  regurgitation. No evidence of mitral stenosis.  4. Tricuspid valve regurgitation is mild to moderate.  5. The aortic valve was not well visualized. Aortic valve regurgitation  is not visualized. Aortic valve sclerosis is present, with no evidence of  aortic valve stenosis.  6. The inferior vena cava is normal in size with <50% respiratory  variability, suggesting right atrial pressure of 8 mmHg.    Neuro/Psych negative neurological ROS  negative psych ROS   GI/Hepatic negative GI ROS, Neg liver ROS,   Endo/Other  diabetes, Oral Hypoglycemic  Agents  Renal/GU      Musculoskeletal  (+) Arthritis ,   Abdominal (+) + obese,   Peds  Hematology negative hematology ROS (+)   Anesthesia Other Findings Past Medical History: No date: Arthritis No date: Diabetes mellitus without complication (HCC) No date: Family history of adverse reaction to anesthesia     Comment:  PONV 04/14/2020: Graves disease 07/20/2020: Graves disease No date: Graves disease No date: Hypertension 02/20/2021: Mixed hyperlipidemia  Past Surgical History: No date: CHOLECYSTECTOMY No date: HERNIA REPAIR No date: OTHER SURGICAL HISTORY     Comment:  scar tissue removal from bowels No date: PARTIAL HYSTERECTOMY     Comment:  Still has ovaries No date: UTERINE FIBROID SURGERY  BMI    Body Mass Index: 36.05 kg/m      Reproductive/Obstetrics negative OB ROS                            Anesthesia Physical Anesthesia Plan  ASA: 2  Anesthesia Plan: MAC   Post-op Pain Management:    Induction:   PONV Risk Score and Plan:   Airway Management Planned:   Additional Equipment:   Intra-op Plan:   Post-operative Plan:   Informed Consent: I have reviewed the patients History and Physical, chart, labs and discussed the procedure including the risks, benefits and alternatives for the proposed anesthesia with the patient or authorized representative who has indicated his/her understanding and acceptance.       Plan Discussed with: Anesthesiologist, CRNA and Surgeon  Anesthesia Plan Comments:        Anesthesia Quick Evaluation

## 2022-04-14 ENCOUNTER — Encounter: Payer: Self-pay | Admitting: Ophthalmology

## 2022-04-14 ENCOUNTER — Ambulatory Visit: Payer: Medicare Other | Admitting: Anesthesiology

## 2022-04-14 ENCOUNTER — Ambulatory Visit
Admission: RE | Admit: 2022-04-14 | Discharge: 2022-04-14 | Disposition: A | Discharge status: Home / Self Care | Payer: Medicare Other | Attending: Ophthalmology | Admitting: Ophthalmology

## 2022-04-14 ENCOUNTER — Encounter
Admission: RE | Disposition: A | Discharge status: Home / Self Care | Payer: Self-pay | Source: Home / Self Care | Attending: Ophthalmology

## 2022-04-14 ENCOUNTER — Ambulatory Visit (AMBULATORY_SURGERY_CENTER): Payer: Medicare Other | Admitting: Anesthesiology

## 2022-04-14 ENCOUNTER — Other Ambulatory Visit: Payer: Self-pay

## 2022-04-14 DIAGNOSIS — Z7984 Long term (current) use of oral hypoglycemic drugs: Secondary | ICD-10-CM | POA: Insufficient documentation

## 2022-04-14 DIAGNOSIS — I2510 Atherosclerotic heart disease of native coronary artery without angina pectoris: Secondary | ICD-10-CM

## 2022-04-14 DIAGNOSIS — E669 Obesity, unspecified: Secondary | ICD-10-CM | POA: Diagnosis not present

## 2022-04-14 DIAGNOSIS — E0500 Thyrotoxicosis with diffuse goiter without thyrotoxic crisis or storm: Secondary | ICD-10-CM | POA: Insufficient documentation

## 2022-04-14 DIAGNOSIS — I10 Essential (primary) hypertension: Secondary | ICD-10-CM | POA: Diagnosis not present

## 2022-04-14 DIAGNOSIS — Z6835 Body mass index (BMI) 35.0-35.9, adult: Secondary | ICD-10-CM | POA: Diagnosis not present

## 2022-04-14 DIAGNOSIS — E119 Type 2 diabetes mellitus without complications: Secondary | ICD-10-CM

## 2022-04-14 DIAGNOSIS — H2511 Age-related nuclear cataract, right eye: Secondary | ICD-10-CM | POA: Insufficient documentation

## 2022-04-14 DIAGNOSIS — I272 Pulmonary hypertension, unspecified: Secondary | ICD-10-CM | POA: Insufficient documentation

## 2022-04-14 DIAGNOSIS — M1990 Unspecified osteoarthritis, unspecified site: Secondary | ICD-10-CM | POA: Diagnosis not present

## 2022-04-14 DIAGNOSIS — E1136 Type 2 diabetes mellitus with diabetic cataract: Secondary | ICD-10-CM | POA: Insufficient documentation

## 2022-04-14 DIAGNOSIS — E782 Mixed hyperlipidemia: Secondary | ICD-10-CM | POA: Diagnosis not present

## 2022-04-14 DIAGNOSIS — H25041 Posterior subcapsular polar age-related cataract, right eye: Secondary | ICD-10-CM

## 2022-04-14 HISTORY — PX: CATARACT EXTRACTION W/PHACO: SHX586

## 2022-04-14 HISTORY — DX: Family history of other specified conditions: Z84.89

## 2022-04-14 LAB — GLUCOSE, CAPILLARY
Glucose-Capillary: 109 mg/dL — ABNORMAL HIGH (ref 70–99)
Glucose-Capillary: 97 mg/dL (ref 70–99)

## 2022-04-14 SURGERY — PHACOEMULSIFICATION, CATARACT, WITH IOL INSERTION
Anesthesia: Monitor Anesthesia Care | Site: Eye | Laterality: Right

## 2022-04-14 MED ORDER — TETRACAINE HCL 0.5 % OP SOLN
1.0000 [drp] | OPHTHALMIC | Status: AC | PRN
  Administered 2022-04-14 (×3): 1 [drp] via OPHTHALMIC

## 2022-04-14 MED ORDER — SIGHTPATH DOSE#1 BSS IO SOLN
INTRAOCULAR | Status: DC | PRN
  Administered 2022-04-14: 1 mL via INTRAMUSCULAR

## 2022-04-14 MED ORDER — MOXIFLOXACIN HCL 0.5 % OP SOLN
OPHTHALMIC | Status: DC | PRN
  Administered 2022-04-14: 0.2 mL via OPHTHALMIC

## 2022-04-14 MED ORDER — SIGHTPATH DOSE#1 NA CHONDROIT SULF-NA HYALURON 40-17 MG/ML IO SOLN
INTRAOCULAR | Status: DC | PRN
  Administered 2022-04-14: 1 mL via INTRAOCULAR

## 2022-04-14 MED ORDER — MIDAZOLAM HCL 2 MG/2ML IJ SOLN
INTRAMUSCULAR | Status: DC | PRN
  Administered 2022-04-14: 2 mg via INTRAVENOUS

## 2022-04-14 MED ORDER — SIGHTPATH DOSE#1 BSS IO SOLN
INTRAOCULAR | Status: DC | PRN
  Administered 2022-04-14: 44 mL via OPHTHALMIC

## 2022-04-14 MED ORDER — SIGHTPATH DOSE#1 BSS IO SOLN
INTRAOCULAR | Status: DC | PRN
  Administered 2022-04-14: 15 mL

## 2022-04-14 MED ORDER — ARMC OPHTHALMIC DILATING DROPS
1.0000 | OPHTHALMIC | Status: AC | PRN
  Administered 2022-04-14 (×3): 1 via OPHTHALMIC

## 2022-04-14 MED ORDER — BRIMONIDINE TARTRATE-TIMOLOL 0.2-0.5 % OP SOLN
OPHTHALMIC | Status: DC | PRN
  Administered 2022-04-14: 1 [drp] via OPHTHALMIC

## 2022-04-14 SURGICAL SUPPLY — 9 items
CATARACT SUITE SIGHTPATH (MISCELLANEOUS) ×1 IMPLANT
FEE CATARACT SUITE SIGHTPATH (MISCELLANEOUS) ×1 IMPLANT
GLOVE SURG ENC TEXT LTX SZ8 (GLOVE) ×1 IMPLANT
GLOVE SURG TRIUMPH 8.0 PF LTX (GLOVE) ×1 IMPLANT
LENS IOL TECNIS EYHANCE 18.0 (Intraocular Lens) IMPLANT
NDL FILTER BLUNT 18X1 1/2 (NEEDLE) ×1 IMPLANT
NEEDLE FILTER BLUNT 18X1 1/2 (NEEDLE) ×1 IMPLANT
SYR 3ML LL SCALE MARK (SYRINGE) ×1 IMPLANT
WATER STERILE IRR 250ML POUR (IV SOLUTION) ×1 IMPLANT

## 2022-04-14 NOTE — Anesthesia Postprocedure Evaluation (Signed)
Anesthesia Post Note  Patient: Emily Wood  Procedure(s) Performed: CATARACT EXTRACTION PHACO AND INTRAOCULAR LENS PLACEMENT (IOC) RIGHT DIABETIC 10.93 00:53.0 (Right: Eye)     Patient location during evaluation: PACU Anesthesia Type: MAC Level of consciousness: awake and alert Pain management: pain level controlled Vital Signs Assessment: post-procedure vital signs reviewed and stable Respiratory status: spontaneous breathing, nonlabored ventilation and respiratory function stable Cardiovascular status: blood pressure returned to baseline and stable Postop Assessment: no apparent nausea or vomiting Anesthetic complications: no   No notable events documented.  Iran Ouch

## 2022-04-14 NOTE — Transfer of Care (Signed)
Immediate Anesthesia Transfer of Care Note  Patient: Emily Wood  Procedure(s) Performed: CATARACT EXTRACTION PHACO AND INTRAOCULAR LENS PLACEMENT (IOC) RIGHT DIABETIC 10.93 00:53.0 (Right: Eye)  Patient Location: PACU  Anesthesia Type: MAC  Level of Consciousness: awake, alert  and patient cooperative  Airway and Oxygen Therapy: Patient Spontanous Breathing and Patient connected to supplemental oxygen  Post-op Assessment: Post-op Vital signs reviewed, Patient's Cardiovascular Status Stable, Respiratory Function Stable, Patent Airway and No signs of Nausea or vomiting  Post-op Vital Signs: Reviewed and stable  Complications: No notable events documented.

## 2022-04-14 NOTE — H&P (Signed)
[facility]   Primary Care Physician:  Copland, Gay Filler, MD Ophthalmologist: Dr. Hortense Ramal  Pre-Procedure History & Physical: HPI:  Emily Wood is a 70 y.o. female here for cataract surgery.   Past Medical History:  Diagnosis Date   Arthritis    Diabetes mellitus without complication ([place])    Family history of adverse reaction to anesthesia    PONV   Graves disease 04/14/2020   Graves disease 07/20/2020   Graves disease    Hypertension    Mixed hyperlipidemia 02/20/2021    Past Surgical History:  Procedure Laterality Date   CHOLECYSTECTOMY     HERNIA REPAIR     OTHER SURGICAL HISTORY     scar tissue removal from bowels   PARTIAL HYSTERECTOMY     Still has ovaries   UTERINE FIBROID SURGERY      Prior to Admission medications   Medication Sig Start Date End Date Taking? Authorizing Provider  Alpha-Lipoic Acid 300 MG CAPS Take 2 capsules by mouth daily.   Yes [provider]  amLODipine (NORVASC) 5 MG tablet TAKE 1 AND 1/2 TABLETS BY  MOUTH DAILY 01/02/22  Yes Copland, Gay Filler, MD  aspirin 81 MG tablet Take 81 mg by mouth daily.   Yes [provider]  atorvastatin (LIPITOR) 40 MG tablet TAKE 1 TABLET BY MOUTH DAILY 03/03/22  Yes Tobb, Kardie, DO  carvedilol (COREG) 12.5 MG tablet TAKE 1 TABLET BY MOUTH  TWICE DAILY 12/16/21  Yes Tobb, Kardie, DO  Cholecalciferol (VITAMIN D3 PO) Take by mouth daily.   Yes [provider]  cloNIDine (CATAPRES) 0.2 MG tablet TAKE 1 TABLET BY MOUTH 3  TIMES DAILY 01/02/22  Yes Copland, Gay Filler, MD  Cobalamin Combinations (B-12) [phone] MCG SUBL B-12 Plus 5,000 mcg-100 mcg sublingual tablet   Yes [provider]  diclofenac Sodium (VOLTAREN) 1 % GEL Apply 4 g topically 4 (four) times daily. Patient taking differently: Apply 4 g topically as needed. 08/04/21  Yes Saguier, Percell Miller, PA-C  ferrous sulfate 325 (65 FE) MG tablet Take 325 mg by mouth daily with breakfast.   Yes [provider]   gabapentin (NEURONTIN) 400 MG capsule TAKE 2 CAPSULES BY MOUTH 3  TIMES DAILY 08/07/21  Yes Copland, Gay Filler, MD  hydrochlorothiazide (HYDRODIURIL) 25 MG tablet TAKE 1 TABLET BY MOUTH  DAILY 01/02/22  Yes Copland, Gay Filler, MD  irbesartan (AVAPRO) 300 MG tablet TAKE 1 TABLET BY MOUTH DAILY 02/02/22  Yes Copland, Gay Filler, MD  isosorbide mononitrate (IMDUR) 30 MG 24 hr tablet Take 1 tablet (30 mg total) by mouth daily. 03/06/22  Yes Tobb, Kardie, DO  metFORMIN (GLUCOPHAGE) 1000 MG tablet TAKE 1 TABLET BY MOUTH TWICE  DAILY WITH MEALS 12/16/21  Yes Copland, Gay Filler, MD  methimazole (TAPAZOLE) 5 MG tablet Take 2.5 mg by mouth daily. 10/02/20  Yes [provider]  methocarbamol (ROBAXIN) 500 MG tablet Take 1 tablet (500 mg total) by mouth every 8 (eight) hours as needed for muscle spasms. 10/01/21  Yes Copland, Gay Filler, MD  Multiple Vitamin (MULTIVITAMIN ADULT PO) Take 1 tablet by mouth daily.   Yes [provider]  OZEMPIC, 0.25 OR 0.5 MG/DOSE, 2 MG/3ML SOPN  10/15/21  Yes [provider]  potassium chloride SA (KLOR-CON M) 20 MEQ tablet TAKE 1 TABLET BY MOUTH  DAILY 01/02/22  Yes Copland, Gay Filler, MD  TURMERIC PO Take 1 tablet by mouth daily.   Yes [provider]  vitamin C (ASCORBIC  ACID) 500 MG tablet Take 500 mg by mouth daily.   Yes [provider]  Accu-Chek Softclix Lancets lancets Use to check blood sugar once day.  Dx code: E11.9 Patient not taking: Reported on 03/06/2022 04/28/21   Copland, Gay Filler, MD  furosemide (LASIX) 40 MG tablet Take 1 tablet (40 mg total) by mouth once a week. Patient not taking: Reported on 04/06/2022 03/06/22   Tobb, Kardie, DO  glucose blood (ACCU-CHEK GUIDE) test strip Use to check blood sugar once day.  Dx code: E11.9 Patient not taking: Reported on 03/06/2022 04/28/21   Copland, Gay Filler, MD  nystatin cream (MYCOSTATIN) Apply 1 application topically 2 (two) times daily. Patient taking differently: Apply 1  application  topically 2 (two) times daily as needed. 10/07/20   Malachy Mood, MD  UNABLE TO FIND daily. Med Name: Nyoka Cowden Lipped Muscle oil GLX3 Patient not taking: Reported on 04/06/2022    [provider]    Allergies as of 02/23/2022   (No Known Allergies)    Family History  Problem Relation Age of Onset   Hyperlipidemia Mother    Cancer Sister    Hyperlipidemia Maternal Aunt    Diabetes Maternal Aunt    Breast cancer Cousin    Diabetes Maternal Uncle     Social History   Socioeconomic History   Marital status: Married    Spouse name: [name]   Number of children: 0   Years of education: 16   Highest education level: Not on file  Occupational History   Occupation: customer service  Tobacco Use   Smoking status: Never   Smokeless tobacco: Never  Vaping Use   Vaping Use: Never used  Substance and Sexual Activity   Alcohol use: No    Alcohol/week: 0.0 standard drinks of alcohol    Comment: rarely   Drug use: No   Sexual activity: Not Currently    Birth control/protection: Post-menopausal  Other Topics Concern   Not on file  Social History Narrative   Lives with husband   Caffeine use: 16oz coffee per day   Social Determinants of Health   Financial Resource Strain: Medium Risk (06/06/2021)   Overall Financial Resource Strain (CARDIA)    Difficulty of Paying Living Expenses: Somewhat hard  Food Insecurity: Food Insecurity Present (06/06/2021)   Hunger Vital Sign    Worried About Running Out of Food in the Last Year: Sometimes true    Ran Out of Food in the Last Year: Never true  Transportation Needs: Unmet Transportation Needs (06/06/2021)   PRAPARE - Hydrologist (Medical): Yes    Lack of Transportation (Non-Medical): No  Physical Activity: Inactive (06/06/2021)   Exercise Vital Sign    Days of Exercise per Week: 0 days    Minutes of Exercise per Session: 0 min  Stress: No Stress Concern Present (06/06/2021)    [place]    Feeling of Stress : Not at all  Social Connections: Not on file  Intimate Partner Violence: Not At Risk (06/06/2021)   Humiliation, Afraid, Rape, and Kick questionnaire    Fear of Current or Ex-Partner: No    Emotionally Abused: No    Physically Abused: No    Sexually Abused: No    Review of Systems: See HPI, otherwise negative ROS  Physical Exam: BP 122/73   Pulse 71   Temp 97.7 F (36.5 C) (Temporal)   Resp 12   Ht 5' 5.5" (  1.664 m)   Wt 97.5 kg   SpO2 96%   BMI 35.23 kg/m  General:   Alert, cooperative in NAD Head:  Normocephalic and atraumatic. Respiratory:  Normal work of breathing. Cardiovascular:  RRR  Impression/Plan: ABBYGALE LAPID is here for cataract surgery.  Risks, benefits, limitations, and alternatives regarding cataract surgery have been reviewed with the patient.  Questions have been answered.  All parties agreeable.   Birder Robson, MD  04/14/2022, 7:19 AM

## 2022-04-14 NOTE — Op Note (Signed)
PREOPERATIVE DIAGNOSIS:  Nuclear sclerotic cataract of the right eye.   POSTOPERATIVE DIAGNOSIS:  H25.041 Cataract   OPERATIVE PROCEDURE:ORPROCALL@   SURGEON:  Birder Robson, MD.   ANESTHESIA:  Anesthesiologist: [place] Ouch, MD CRNA: Tobie Poet, CRNA  1.      Managed anesthesia care. 2.      0.3m of Shugarcaine was instilled in the eye following the paracentesis.   COMPLICATIONS:  None.   TECHNIQUE:   Stop and chop   DESCRIPTION OF PROCEDURE:  The patient was examined and consented in the preoperative holding area where the aforementioned topical anesthesia was applied to the right eye and then brought back to the Operating Room where the right eye was prepped and draped in the usual sterile ophthalmic fashion and a lid speculum was placed. A paracentesis was created with the side port blade and the anterior chamber was filled with viscoelastic. A near clear corneal incision was performed with the steel keratome. A continuous curvilinear capsulorrhexis was performed with a cystotome followed by the capsulorrhexis forceps. Hydrodissection and hydrodelineation were carried out with BSS on a blunt cannula. The lens was removed in a stop and chop  technique and the remaining cortical material was removed with the irrigation-aspiration handpiece. The capsular bag was inflated with viscoelastic and the Technis [id number]  lens was placed in the capsular bag without complication. The remaining viscoelastic was removed from the eye with the irrigation-aspiration handpiece. The wounds were hydrated. The anterior chamber was flushed with BSS and the eye was inflated to physiologic pressure. 0.110mof Vigamox was placed in the anterior chamber. The wounds were found to be water tight. The eye was dressed with Combigan. The patient was given protective glasses to wear throughout the day and a shield with which to sleep tonight. The patient was also given drops with which to begin a drop  regimen today and will follow-up with me in one day. Implant Name Type Inv. Item Serial No. Manufacturer Lot No. LRB No. Used Action  LENS IOL TECNIS EYHANCE 18.0 - [id number] Lens LENS IOL TECNIS EYHANCE 18.0 [id number]  Right 1 Implanted   Procedure(s): CATARACT EXTRACTION PHACO AND INTRAOCULAR LENS PLACEMENT (IOC) RIGHT DIABETIC 10.93 00:53.0 (Right)  Electronically signed: WiBirder Robson/26/2023 7:51 AM

## 2022-04-15 ENCOUNTER — Encounter: Payer: Self-pay | Admitting: Ophthalmology

## 2022-04-20 ENCOUNTER — Other Ambulatory Visit: Payer: Self-pay | Admitting: Family Medicine

## 2022-04-20 DIAGNOSIS — R609 Edema, unspecified: Secondary | ICD-10-CM

## 2022-04-20 DIAGNOSIS — I10 Essential (primary) hypertension: Secondary | ICD-10-CM

## 2022-04-21 ENCOUNTER — Encounter: Payer: Self-pay | Admitting: Ophthalmology

## 2022-04-23 DIAGNOSIS — H2512 Age-related nuclear cataract, left eye: Secondary | ICD-10-CM | POA: Diagnosis not present

## 2022-04-24 NOTE — Discharge Instructions (Signed)

## 2022-04-28 ENCOUNTER — Ambulatory Visit: Payer: Medicare Other | Admitting: Anesthesiology

## 2022-04-28 ENCOUNTER — Encounter
Admission: RE | Disposition: A | Discharge status: Home / Self Care | Payer: Self-pay | Source: Home / Self Care | Attending: Ophthalmology

## 2022-04-28 ENCOUNTER — Other Ambulatory Visit: Payer: Self-pay

## 2022-04-28 ENCOUNTER — Ambulatory Visit
Admission: RE | Admit: 2022-04-28 | Discharge: 2022-04-28 | Disposition: A | Discharge status: Home / Self Care | Payer: Medicare Other | Attending: Ophthalmology | Admitting: Ophthalmology

## 2022-04-28 DIAGNOSIS — E1136 Type 2 diabetes mellitus with diabetic cataract: Secondary | ICD-10-CM | POA: Insufficient documentation

## 2022-04-28 DIAGNOSIS — Z7984 Long term (current) use of oral hypoglycemic drugs: Secondary | ICD-10-CM | POA: Diagnosis not present

## 2022-04-28 DIAGNOSIS — E782 Mixed hyperlipidemia: Secondary | ICD-10-CM | POA: Insufficient documentation

## 2022-04-28 DIAGNOSIS — H2512 Age-related nuclear cataract, left eye: Secondary | ICD-10-CM | POA: Insufficient documentation

## 2022-04-28 DIAGNOSIS — E0500 Thyrotoxicosis with diffuse goiter without thyrotoxic crisis or storm: Secondary | ICD-10-CM | POA: Diagnosis not present

## 2022-04-28 DIAGNOSIS — I10 Essential (primary) hypertension: Secondary | ICD-10-CM | POA: Diagnosis not present

## 2022-04-28 DIAGNOSIS — I2510 Atherosclerotic heart disease of native coronary artery without angina pectoris: Secondary | ICD-10-CM | POA: Insufficient documentation

## 2022-04-28 DIAGNOSIS — G709 Myoneural disorder, unspecified: Secondary | ICD-10-CM | POA: Insufficient documentation

## 2022-04-28 DIAGNOSIS — H25042 Posterior subcapsular polar age-related cataract, left eye: Secondary | ICD-10-CM | POA: Diagnosis not present

## 2022-04-28 HISTORY — PX: CATARACT EXTRACTION W/PHACO: SHX586

## 2022-04-28 LAB — GLUCOSE, CAPILLARY
Glucose-Capillary: 86 mg/dL (ref 70–99)
Glucose-Capillary: 91 mg/dL (ref 70–99)

## 2022-04-28 SURGERY — PHACOEMULSIFICATION, CATARACT, WITH IOL INSERTION
Anesthesia: Monitor Anesthesia Care | Site: Eye | Laterality: Left

## 2022-04-28 MED ORDER — SIGHTPATH DOSE#1 NA CHONDROIT SULF-NA HYALURON 40-17 MG/ML IO SOLN
INTRAOCULAR | Status: DC | PRN
  Administered 2022-04-28: 1 mL via INTRAOCULAR

## 2022-04-28 MED ORDER — MOXIFLOXACIN HCL 0.5 % OP SOLN
OPHTHALMIC | Status: DC | PRN
  Administered 2022-04-28: 0.2 mL via OPHTHALMIC

## 2022-04-28 MED ORDER — SIGHTPATH DOSE#1 BSS IO SOLN
INTRAOCULAR | Status: DC | PRN
  Administered 2022-04-28: 60 mL via OPHTHALMIC

## 2022-04-28 MED ORDER — SIGHTPATH DOSE#1 BSS IO SOLN
INTRAOCULAR | Status: DC | PRN
  Administered 2022-04-28: 15 mL via INTRAOCULAR

## 2022-04-28 MED ORDER — BRIMONIDINE TARTRATE-TIMOLOL 0.2-0.5 % OP SOLN
OPHTHALMIC | Status: DC | PRN
  Administered 2022-04-28: 1 [drp] via OPHTHALMIC

## 2022-04-28 MED ORDER — SIGHTPATH DOSE#1 BSS IO SOLN
INTRAOCULAR | Status: DC | PRN
  Administered 2022-04-28: 2 mL

## 2022-04-28 MED ORDER — TETRACAINE HCL 0.5 % OP SOLN
1.0000 [drp] | OPHTHALMIC | Status: DC | PRN
  Administered 2022-04-28 (×3): 1 [drp] via OPHTHALMIC

## 2022-04-28 MED ORDER — ARMC OPHTHALMIC DILATING DROPS
1.0000 | OPHTHALMIC | Status: DC | PRN
  Administered 2022-04-28 (×3): 1 via OPHTHALMIC

## 2022-04-28 MED ORDER — FENTANYL CITRATE (PF) 100 MCG/2ML IJ SOLN
INTRAMUSCULAR | Status: DC | PRN
  Administered 2022-04-28: 50 ug via INTRAVENOUS

## 2022-04-28 MED ORDER — MIDAZOLAM HCL 2 MG/2ML IJ SOLN
INTRAMUSCULAR | Status: DC | PRN
  Administered 2022-04-28: 2 mg via INTRAVENOUS

## 2022-04-28 MED ORDER — LACTATED RINGERS IV SOLN: INTRAVENOUS | Status: DC

## 2022-04-28 SURGICAL SUPPLY — 11 items
CANNULA ANT/CHMB 27G (MISCELLANEOUS) IMPLANT
CANNULA ANT/CHMB 27GA (MISCELLANEOUS) IMPLANT
CATARACT SUITE SIGHTPATH (MISCELLANEOUS) ×1 IMPLANT
FEE CATARACT SUITE SIGHTPATH (MISCELLANEOUS) ×1 IMPLANT
GLOVE SURG ENC TEXT LTX SZ8 (GLOVE) ×1 IMPLANT
GLOVE SURG TRIUMPH 8.0 PF LTX (GLOVE) ×1 IMPLANT
LENS IOL TECNIS EYHANCE 18.0 (Intraocular Lens) IMPLANT
NDL FILTER BLUNT 18X1 1/2 (NEEDLE) ×1 IMPLANT
NEEDLE FILTER BLUNT 18X1 1/2 (NEEDLE) ×1 IMPLANT
SYR 3ML LL SCALE MARK (SYRINGE) ×1 IMPLANT
WATER STERILE IRR 250ML POUR (IV SOLUTION) ×1 IMPLANT

## 2022-04-28 NOTE — Anesthesia Preprocedure Evaluation (Signed)
Anesthesia Evaluation  Patient identified by MRN, date of birth, ID band Patient awake    Reviewed: Allergy & Precautions, NPO status , Patient's Chart, lab work & pertinent test results  History of Anesthesia Complications (+) DIFFICULT IV STICK / SPECIAL LINE and history of anesthetic complications  Airway Mallampati: III  TM Distance: >3 FB Neck ROM: full    Dental  (+) Chipped   Pulmonary neg pulmonary ROS,    Pulmonary exam normal        Cardiovascular hypertension, (-) angina+ CAD  Normal cardiovascular exam     Neuro/Psych  Neuromuscular disease negative psych ROS   GI/Hepatic negative GI ROS, Neg liver ROS,   Endo/Other  negative endocrine ROSdiabetes  Renal/GU Renal disease     Musculoskeletal   Abdominal   Peds  Hematology negative hematology ROS (+)   Anesthesia Other Findings Past Medical History: No date: Arthritis No date: Diabetes mellitus without complication (HCC) No date: Family history of adverse reaction to anesthesia     Comment:  PONV 04/14/2020: Graves disease 07/20/2020: Graves disease No date: Graves disease No date: Hypertension 02/20/2021: Mixed hyperlipidemia  Past Surgical History: 04/14/2022: CATARACT EXTRACTION W/PHACO; Right     Comment:  Procedure: CATARACT EXTRACTION PHACO AND INTRAOCULAR               LENS PLACEMENT (IOC) RIGHT DIABETIC 10.93 00:53.0;                Surgeon: Birder Robson, MD;  Location: [place];  Service: Ophthalmology;  Laterality: Right; No date: CHOLECYSTECTOMY No date: HERNIA REPAIR No date: OTHER SURGICAL HISTORY     Comment:  scar tissue removal from bowels No date: PARTIAL HYSTERECTOMY     Comment:  Still has ovaries No date: UTERINE FIBROID SURGERY  BMI    Body Mass Index: 35.71 kg/m      Reproductive/Obstetrics negative OB ROS                             Anesthesia  Physical Anesthesia Plan  ASA: 3  Anesthesia Plan: MAC   Post-op Pain Management:    Induction: Intravenous  PONV Risk Score and Plan:   Airway Management Planned: Natural Airway and Nasal Cannula  Additional Equipment:   Intra-op Plan:   Post-operative Plan:   Informed Consent: I have reviewed the patients History and Physical, chart, labs and discussed the procedure including the risks, benefits and alternatives for the proposed anesthesia with the patient or authorized representative who has indicated his/her understanding and acceptance.     Dental Advisory Given  Plan Discussed with: Anesthesiologist, CRNA and Surgeon  Anesthesia Plan Comments: (Patient consented for risks of anesthesia including but not limited to:  - adverse reactions to medications - damage to eyes, teeth, lips or other oral mucosa - nerve damage due to positioning  - sore throat or hoarseness - Damage to heart, brain, nerves, lungs, other parts of body or loss of life  Patient voiced understanding.)        Anesthesia Quick Evaluation

## 2022-04-28 NOTE — H&P (Signed)
[facility]   Primary Care Physician:  Lorelei Pont Gay Filler, MD Ophthalmologist: Dr. George Ina  Pre-Procedure History & Physical: HPI:  Emily Wood is a 71 y.o. female here for cataract surgery.   Past Medical History:  Diagnosis Date   Arthritis    Diabetes mellitus without complication ([place])    Family history of adverse reaction to anesthesia    PONV   Graves disease 04/14/2020   Graves disease 07/20/2020   Graves disease    Hypertension    Mixed hyperlipidemia 02/20/2021    Past Surgical History:  Procedure Laterality Date   CATARACT EXTRACTION W/PHACO Right 04/14/2022   Procedure: CATARACT EXTRACTION PHACO AND INTRAOCULAR LENS PLACEMENT (IOC) RIGHT DIABETIC 10.93 00:53.0;  Surgeon: Birder Robson, MD;  Location: [place];  Service: Ophthalmology;  Laterality: Right;   CHOLECYSTECTOMY     HERNIA REPAIR     OTHER SURGICAL HISTORY     scar tissue removal from bowels   PARTIAL HYSTERECTOMY     Still has ovaries   UTERINE FIBROID SURGERY      Prior to Admission medications   Medication Sig Start Date End Date Taking? Authorizing Provider  Alpha-Lipoic Acid 300 MG CAPS Take 2 capsules by mouth daily.   Yes [provider]  amLODipine (NORVASC) 5 MG tablet TAKE 1 AND 1/2 TABLETS BY MOUTH  DAILY 04/21/22  Yes Copland, Gay Filler, MD  aspirin 81 MG tablet Take 81 mg by mouth daily.   Yes [provider]  atorvastatin (LIPITOR) 40 MG tablet TAKE 1 TABLET BY MOUTH DAILY 03/03/22  Yes Tobb, Kardie, DO  Biotin w/ Vitamins C & E (HAIR/SKIN/NAILS PO) Take 6,000 mcg by mouth daily. 2 tabs daily   Yes [provider]  carvedilol (COREG) 12.5 MG tablet TAKE 1 TABLET BY MOUTH  TWICE DAILY 12/16/21  Yes Tobb, Kardie, DO  Cholecalciferol (VITAMIN D3 PO) Take 125 mg by mouth daily.   Yes [provider]  cloNIDine (CATAPRES) 0.2 MG tablet TAKE 1 TABLET BY MOUTH 3 TIMES  DAILY 04/21/22  Yes Copland, Gay Filler, MD  Cobalamin  Combinations (B-12) [phone] MCG SUBL B-12 Plus 5,000 mcg-100 mcg sublingual tablet   Yes [provider]  diclofenac Sodium (VOLTAREN) 1 % GEL Apply 4 g topically 4 (four) times daily. Patient taking differently: Apply 4 g topically as needed. 08/04/21  Yes Saguier, Percell Miller, PA-C  ferrous sulfate 325 (65 FE) MG tablet Take 325 mg by mouth daily with breakfast.   Yes [provider]  furosemide (LASIX) 40 MG tablet Take 1 tablet (40 mg total) by mouth once a week. 03/06/22  Yes Tobb, Kardie, DO  gabapentin (NEURONTIN) 400 MG capsule TAKE 2 CAPSULES BY MOUTH 3  TIMES DAILY 08/07/21  Yes Copland, Gay Filler, MD  hydrochlorothiazide (HYDRODIURIL) 25 MG tablet TAKE 1 TABLET BY MOUTH DAILY 04/21/22  Yes Copland, Gay Filler, MD  irbesartan (AVAPRO) 300 MG tablet TAKE 1 TABLET BY MOUTH DAILY 02/02/22  Yes Copland, Gay Filler, MD  isosorbide mononitrate (IMDUR) 30 MG 24 hr tablet Take 1 tablet (30 mg total) by mouth daily. 03/06/22  Yes Tobb, Kardie, DO  metFORMIN (GLUCOPHAGE) 1000 MG tablet TAKE 1 TABLET BY MOUTH TWICE  DAILY WITH MEALS 12/16/21  Yes Copland, Gay Filler, MD  methimazole (TAPAZOLE) 5 MG tablet Take 2.5 mg by mouth daily. 10/02/20  Yes [provider]  methocarbamol (ROBAXIN) 500 MG tablet Take 1 tablet (500 mg total) by mouth every 8 (eight) hours as needed  for muscle spasms. 10/01/21  Yes Copland, Gay Filler, MD  Multiple Vitamin (MULTIVITAMIN ADULT PO) Take 1 tablet by mouth daily.   Yes [provider]  oxybutynin (DITROPAN XL) 15 MG 24 hr tablet Take 15 mg by mouth daily.   Yes [provider]  OZEMPIC, 0.25 OR 0.5 MG/DOSE, 2 MG/3ML SOPN  10/15/21  Yes [provider]  potassium chloride SA (KLOR-CON M) 20 MEQ tablet TAKE 1 TABLET BY MOUTH DAILY 04/21/22  Yes Copland, Gay Filler, MD  TURMERIC PO Take 500 mg by mouth daily.   Yes [provider]  vitamin C (ASCORBIC ACID) 500 MG tablet Take 500 mg by mouth daily.   Yes [provider]   Accu-Chek Softclix Lancets lancets Use to check blood sugar once day.  Dx code: E11.9 Patient not taking: Reported on 03/06/2022 04/28/21   Copland, Gay Filler, MD  glucose blood (ACCU-CHEK GUIDE) test strip Use to check blood sugar once day.  Dx code: E11.9 Patient not taking: Reported on 03/06/2022 04/28/21   Copland, Gay Filler, MD  nystatin cream (MYCOSTATIN) Apply 1 application topically 2 (two) times daily. Patient taking differently: Apply 1 application  topically 2 (two) times daily as needed. 10/07/20   Malachy Mood, MD  UNABLE TO FIND daily. Med Name: Nyoka Cowden Lipped Muscle oil GLX3 Patient not taking: Reported on 04/06/2022    [provider]    Allergies as of 02/23/2022   (No Known Allergies)    Family History  Problem Relation Age of Onset   Hyperlipidemia Mother    Cancer Sister    Hyperlipidemia Maternal Aunt    Diabetes Maternal Aunt    Breast cancer Cousin    Diabetes Maternal Uncle     Social History   Socioeconomic History   Marital status: Married    Spouse name: [name]   Number of children: 0   Years of education: 16   Highest education level: Not on file  Occupational History   Occupation: customer service  Tobacco Use   Smoking status: Never   Smokeless tobacco: Never  Vaping Use   Vaping Use: Never used  Substance and Sexual Activity   Alcohol use: No    Alcohol/week: 0.0 standard drinks of alcohol    Comment: rarely   Drug use: No   Sexual activity: Not Currently    Birth control/protection: Post-menopausal  Other Topics Concern   Not on file  Social History Narrative   Lives with husband   Caffeine use: 16oz coffee per day   Social Determinants of Health   Financial Resource Strain: Medium Risk (06/06/2021)   Overall Financial Resource Strain (CARDIA)    Difficulty of Paying Living Expenses: Somewhat hard  Food Insecurity: Food Insecurity Present (06/06/2021)   Hunger Vital Sign    Worried About Running Out of Food in the  Last Year: Sometimes true    Ran Out of Food in the Last Year: Never true  Transportation Needs: Unmet Transportation Needs (06/06/2021)   PRAPARE - Hydrologist (Medical): Yes    Lack of Transportation (Non-Medical): No  Physical Activity: Inactive (06/06/2021)   Exercise Vital Sign    Days of Exercise per Week: 0 days    Minutes of Exercise per Session: 0 min  Stress: No Stress Concern Present (06/06/2021)   [place]    Feeling of Stress : Not at all  Social Connections: Not on file  Intimate Partner  Violence: Not At Risk (06/06/2021)   Humiliation, Afraid, Rape, and Kick questionnaire    Fear of Current or Ex-Partner: No    Emotionally Abused: No    Physically Abused: No    Sexually Abused: No    Review of Systems: See HPI, otherwise negative ROS  Physical Exam: BP 124/67   Pulse 71   Temp (!) 97.3 F (36.3 C) (Temporal)   Resp 16   Ht '5\' 5"'$  (1.651 m)   Wt 97.3 kg   SpO2 95%   BMI 35.71 kg/m  General:   Alert, cooperative in NAD Head:  Normocephalic and atraumatic. Respiratory:  Normal work of breathing. Cardiovascular:  RRR  Impression/Plan: Emily Wood is here for cataract surgery.  Risks, benefits, limitations, and alternatives regarding cataract surgery have been reviewed with the patient.  Questions have been answered.  All parties agreeable.   Birder Robson, MD  04/28/2022, 10:30 AM

## 2022-04-28 NOTE — Transfer of Care (Signed)
Immediate Anesthesia Transfer of Care Note  Patient: Emily Wood  Procedure(s) Performed: CATARACT EXTRACTION PHACO AND INTRAOCULAR LENS PLACEMENT (IOC) LEFT DIABETIC 6.05 00:45.7 (Left: Eye)  Patient Location: PACU  Anesthesia Type: MAC  Level of Consciousness: awake, alert  and patient cooperative  Airway and Oxygen Therapy: Patient Spontanous Breathing and Patient connected to supplemental oxygen  Post-op Assessment: Post-op Vital signs reviewed, Patient's Cardiovascular Status Stable, Respiratory Function Stable, Patent Airway and No signs of Nausea or vomiting  Post-op Vital Signs: Reviewed and stable  Complications: No notable events documented.

## 2022-04-28 NOTE — Anesthesia Postprocedure Evaluation (Signed)
Anesthesia Post Note  Patient: LESLE FARON  Procedure(s) Performed: CATARACT EXTRACTION PHACO AND INTRAOCULAR LENS PLACEMENT (IOC) LEFT DIABETIC 6.05 00:45.7 (Left: Eye)  Patient location during evaluation: Phase II Anesthesia Type: MAC Level of consciousness: awake and alert Pain management: pain level controlled Vital Signs Assessment: post-procedure vital signs reviewed and stable Respiratory status: spontaneous breathing, nonlabored ventilation, respiratory function stable and patient connected to nasal cannula oxygen Cardiovascular status: stable and blood pressure returned to baseline Postop Assessment: no apparent nausea or vomiting Anesthetic complications: no   No notable events documented.   Last Vitals:  Vitals:   04/28/22 1056 04/28/22 1100  BP: 135/71 135/66  Pulse: 68 72  Resp: 14 14  Temp: (!) 36.2 C   SpO2: 95% 94%    Last Pain:  Vitals:   04/28/22 1056  TempSrc:   PainSc: 0-No pain                 Precious Haws Conny Moening

## 2022-04-28 NOTE — Op Note (Signed)
PREOPERATIVE DIAGNOSIS:  Nuclear sclerotic cataract of the left eye.   POSTOPERATIVE DIAGNOSIS:  Nuclear sclerotic cataract of the left eye.   OPERATIVE PROCEDURE:ORPROCALL@   SURGEON:  Emily Robson, MD.   ANESTHESIA:  Anesthesiologist: Piscitello, Precious Haws, MD CRNA: Jacqualin Combes, CRNA  1.      Managed anesthesia care. 2.     0.76m of Shugarcaine was instilled following the paracentesis   COMPLICATIONS:  None.   TECHNIQUE:   Stop and chop   DESCRIPTION OF PROCEDURE:  The patient was examined and consented in the preoperative holding area where the aforementioned topical anesthesia was applied to the left eye and then brought back to the Operating Room where the left eye was prepped and draped in the usual sterile ophthalmic fashion and a lid speculum was placed. A paracentesis was created with the side port blade and the anterior chamber was filled with viscoelastic. A near clear corneal incision was performed with the steel keratome. A continuous curvilinear capsulorrhexis was performed with a cystotome followed by the capsulorrhexis forceps. Hydrodissection and hydrodelineation were carried out with BSS on a blunt cannula. The lens was removed in a stop and chop  technique and the remaining cortical material was removed with the irrigation-aspiration handpiece. The capsular bag was inflated with viscoelastic and the Technis [id number] lens was placed in the capsular bag without complication. The remaining viscoelastic was removed from the eye with the irrigation-aspiration handpiece. The wounds were hydrated. The anterior chamber was flushed with BSS and the eye was inflated to physiologic pressure. 0.128mVigamox was placed in the anterior chamber. The wounds were found to be water tight. The eye was dressed with Combigan. The patient was given protective glasses to wear throughout the day and a shield with which to sleep tonight. The patient was also given drops with which to begin a drop  regimen today and will follow-up with me in one day. Implant Name Type Inv. Item Serial No. Manufacturer Lot No. LRB No. Used Action  LENS IOL TECNIS EYHANCE 18.0 - [id number] Lens LENS IOL TECNIS EYHANCE 18.0 [id number]  Left 1 Implanted    Procedure(s) with comments: CATARACT EXTRACTION PHACO AND INTRAOCULAR LENS PLACEMENT (IOC) LEFT DIABETIC 6.05 00:45.7 (Left) - Diabetic  Electronically signed: WiBirder Robson0/04/2022 10:58 AM

## 2022-04-29 ENCOUNTER — Encounter: Payer: Self-pay | Admitting: Ophthalmology

## 2022-05-02 ENCOUNTER — Other Ambulatory Visit: Payer: Self-pay | Admitting: Family Medicine

## 2022-05-02 DIAGNOSIS — M5432 Sciatica, left side: Secondary | ICD-10-CM

## 2022-05-04 NOTE — Telephone Encounter (Signed)
Refill? Pt was last seen 10/01/21 and no future appointments scheduled.

## 2022-05-11 ENCOUNTER — Ambulatory Visit
Admission: RE | Admit: 2022-05-11 | Discharge: 2022-05-11 | Disposition: A | Discharge status: Home / Self Care | Payer: Medicare Other | Source: Ambulatory Visit | Attending: Urology | Admitting: Urology

## 2022-05-11 DIAGNOSIS — N281 Cyst of kidney, acquired: Secondary | ICD-10-CM | POA: Diagnosis not present

## 2022-05-11 MED ORDER — GADOPICLENOL 0.5 MMOL/ML IV SOLN
10.0000 mL | Freq: Once | INTRAVENOUS | Status: AC | PRN
  Administered 2022-05-11: 10 mL via INTRAVENOUS

## 2022-05-14 DIAGNOSIS — E0500 Thyrotoxicosis with diffuse goiter without thyrotoxic crisis or storm: Secondary | ICD-10-CM | POA: Diagnosis not present

## 2022-05-14 DIAGNOSIS — E1122 Type 2 diabetes mellitus with diabetic chronic kidney disease: Secondary | ICD-10-CM | POA: Diagnosis not present

## 2022-05-14 DIAGNOSIS — N1831 Chronic kidney disease, stage 3a: Secondary | ICD-10-CM | POA: Diagnosis not present

## 2022-05-15 NOTE — Progress Notes (Unsigned)
[place] at [facility] [address], [place], [place] [address] [phone]  Date:  05/20/2022   Name:  Emily Wood   DOB:  03/04/1951   MRN:  [id number]  PCP:  Darreld Mclean, MD    Chief Complaint: URI (Sxs started 05/15/22: include- cough, sneezing, HA, chest congestion, and night sweats. Emily Wood is also having dizziness when standing. Covid negative on Monday. )   History of Present Illness:  Emily Wood is a 71 y.o. very pleasant female patient who presents with the following:  Patient seen today with symptoms potentially due to upper respiratory infection Most recent visit with myself was in March for routine follow-up -history of diabetes, chronic kidney disease, hypertension, venous insufficiency and lymphedema, obesity, Graves' hyperthyroidism in remission  Lab Results  Component Value Date   HGBA1C 6.3 01/17/2021   Pt notes she has been ill for about 5 days now She feels week, sore throat, mild chest congestion Not eating much She is having night sweats She is not aware if having a fever She is feeling really tired out  Her husband is sick but not this severe  She notes some cough but not major   She did take a covid test several days ago it was negative   No urinary sx   Of note her endocrinologist just recently checked her TSH and A1c  Patient Active Problem List   Diagnosis Date Noted   Coronary artery disease involving native heart 03/06/2022   Tricuspid valve insufficiency 11/05/2021   Hypertension 11/05/2021   Snoring 11/05/2021   Daytime somnolence 11/05/2021   Type 2 diabetes mellitus with hyperosmolarity without coma, without long-term current use of insulin ([place]) 11/05/2021   Coronary artery disease involving native coronary artery of native heart without angina pectoris 05/15/2021   Nonrheumatic tricuspid valve regurgitation 05/15/2021   Medication management 05/15/2021    Pulmonary hypertension, unspecified ([place]) 05/15/2021   Precordial pain 02/20/2021   Nonspecific abnormal electrocardiogram (ECG) (EKG) 02/20/2021   Mixed hyperlipidemia 02/20/2021   Obesity (BMI 30-39.9) 02/20/2021   Arthritis 02/18/2021   Diabetic retinopathy ([place]) 08/28/2020   Mixed incontinence 05/31/2020   Urinary urgency 05/31/2020   Graves disease 04/14/2020   Right renal mass 11/22/2019   Arthritis of left knee 09/25/2019   Lymphedema 01/25/2017   Fibroid uterus 01/14/2017   Hypertensive heart disease without heart failure 10/01/2016   Chronic kidney disease, stage 3 ([place]) 09/21/2016   Meralgia paresthetica of left side 08/27/2015   Diabetes mellitus type 2, controlled ([place]) 08/02/2015   Chronic venous insufficiency 06/28/2015   Essential hypertension 02/15/2015   Peripheral edema 02/15/2015   Chronic knee pain 02/15/2015    Past Medical History:  Diagnosis Date   Arthritis    Diabetes mellitus without complication ([place])    Family history of adverse reaction to anesthesia    PONV   Graves disease 04/14/2020   Graves disease 07/20/2020   Graves disease    Hypertension    Mixed hyperlipidemia 02/20/2021    Past Surgical History:  Procedure Laterality Date   CATARACT EXTRACTION W/PHACO Right 04/14/2022   Procedure: CATARACT EXTRACTION PHACO AND INTRAOCULAR LENS PLACEMENT (IOC) RIGHT DIABETIC 10.93 00:53.0;  Surgeon: Birder Robson, MD;  Location: [facility];  Service: Ophthalmology;  Laterality: Right;   CATARACT EXTRACTION W/PHACO Left 04/28/2022   Procedure: CATARACT EXTRACTION PHACO AND INTRAOCULAR LENS PLACEMENT (IOC) LEFT DIABETIC 6.05 00:45.7;  Surgeon: Birder Robson, MD;  Location: [facility];  Service: Ophthalmology;  Laterality: Left;  Diabetic   CHOLECYSTECTOMY     HERNIA REPAIR     OTHER SURGICAL HISTORY     scar tissue removal from bowels   PARTIAL HYSTERECTOMY     Still has ovaries   UTERINE FIBROID SURGERY      Social  History   Tobacco Use   Smoking status: Never   Smokeless tobacco: Never  Vaping Use   Vaping Use: Never used  Substance Use Topics   Alcohol use: No    Alcohol/week: 0.0 standard drinks of alcohol    Comment: rarely   Drug use: No    Family History  Problem Relation Age of Onset   Hyperlipidemia Mother    Cancer Sister    Hyperlipidemia Maternal Aunt    Diabetes Maternal Aunt    Breast cancer Cousin    Diabetes Maternal Uncle     No Known Allergies  Medication list has been reviewed and updated.  Current Outpatient Medications on File Prior to Visit  Medication Sig Dispense Refill   Accu-Chek Softclix Lancets lancets Use to check blood sugar once day.  Dx code: E11.9 100 each 12   Alpha-Lipoic Acid 300 MG CAPS Take 2 capsules by mouth daily.     amLODipine (NORVASC) 5 MG tablet TAKE 1 AND 1/2 TABLETS BY MOUTH  DAILY 120 tablet 3   aspirin 81 MG tablet Take 81 mg by mouth daily.     atorvastatin (LIPITOR) 40 MG tablet TAKE 1 TABLET BY MOUTH DAILY 100 tablet 2   Biotin w/ Vitamins C & E (HAIR/SKIN/NAILS PO) Take 6,000 mcg by mouth daily. 2 tabs daily     carvedilol (COREG) 12.5 MG tablet TAKE 1 TABLET BY MOUTH  TWICE DAILY 180 tablet 3   Cholecalciferol (VITAMIN D3 PO) Take 125 mg by mouth daily.     cloNIDine (CATAPRES) 0.2 MG tablet TAKE 1 TABLET BY MOUTH 3 TIMES  DAILY 240 tablet 3   Cobalamin Combinations (B-12) [phone] MCG SUBL B-12 Plus 5,000 mcg-100 mcg sublingual tablet     diclofenac Sodium (VOLTAREN) 1 % GEL Apply 4 g topically 4 (four) times daily. (Patient taking differently: Apply 4 g topically as needed.) 100 g 0   ferrous sulfate 325 (65 FE) MG tablet Take 325 mg by mouth daily with breakfast.     furosemide (LASIX) 40 MG tablet Take 1 tablet (40 mg total) by mouth once a week. 13 tablet 3   gabapentin (NEURONTIN) 400 MG capsule TAKE 2 CAPSULES BY MOUTH 3 TIMES DAILY 600 capsule 2   GEMTESA 75 MG TABS Take 1 tablet by mouth daily.     glucose blood  (ACCU-CHEK GUIDE) test strip Use to check blood sugar once day.  Dx code: E11.9 100 each 1   hydrochlorothiazide (HYDRODIURIL) 25 MG tablet TAKE 1 TABLET BY MOUTH DAILY 80 tablet 3   irbesartan (AVAPRO) 300 MG tablet TAKE 1 TABLET BY MOUTH DAILY 100 tablet 2   isosorbide mononitrate (IMDUR) 30 MG 24 hr tablet Take 1 tablet (30 mg total) by mouth daily. 90 tablet 3   metFORMIN (GLUCOPHAGE) 1000 MG tablet TAKE 1 TABLET BY MOUTH TWICE  DAILY WITH MEALS 180 tablet 3   methimazole (TAPAZOLE) 5 MG tablet Take 2.5 mg by mouth daily.     methocarbamol (ROBAXIN) 500 MG tablet Take 1 tablet (500 mg total) by mouth every 8 (eight) hours as needed for muscle spasms. 30 tablet 0   Multiple  Vitamin (MULTIVITAMIN ADULT PO) Take 1 tablet by mouth daily.     nystatin cream (MYCOSTATIN) Apply 1 application topically 2 (two) times daily. (Patient taking differently: Apply 1 application  topically 2 (two) times daily as needed.) 30 g 1   OZEMPIC, 0.25 OR 0.5 MG/DOSE, 2 MG/3ML SOPN      potassium chloride SA (KLOR-CON M) 20 MEQ tablet TAKE 1 TABLET BY MOUTH DAILY 80 tablet 3   TURMERIC PO Take 500 mg by mouth daily.     UNABLE TO FIND daily. Med Name: Nyoka Cowden Lipped Muscle oil GLX3     vitamin C (ASCORBIC ACID) 500 MG tablet Take 500 mg by mouth daily.     No current facility-administered medications on file prior to visit.    Review of Systems:  As per HPI- otherwise negative.   Physical Examination: Vitals:   05/20/22 1418  BP: 110/60  Pulse: 87  Resp: 18  Temp: 97.6 F (36.4 C)  SpO2: 97%   Vitals:   05/20/22 1418  Weight: 197 lb 12.8 oz (89.7 kg)  Height: '5\' 5"'$  (1.651 m)   Body mass index is 32.92 kg/m. Ideal Body Weight: Weight in (lb) to have BMI = 25: 149.9  GEN: no acute distress.  Obese, nontoxic but does not appear to feel well HEENT: Atraumatic, Normocephalic.  Bilateral TM wnl, oropharynx normal.  PEERL,EOMI.   Ears and Nose: No external deformity. CV: RRR, No M/G/R. No JVD. No  thrill. No extra heart sounds. PULM: CTA B, no wheezes, crackles, rhonchi. No retractions. No resp. distress. No accessory muscle use. ABD: S, NT, ND, +BS. No rebound. No HSM. EXTR: No c/c/e PSYCH: Normally interactive. Conversant.   Results for orders placed or performed in visit on 05/20/22  POC COVID-19 BinaxNow  Result Value Ref Range   SARS Coronavirus 2 Ag Negative Negative  POCT rapid strep A  Result Value Ref Range   Rapid Strep A Screen Negative Negative  POCT Influenza A/B  Result Value Ref Range   Influenza A, POC Negative Negative   Influenza B, POC Negative Negative    Assessment and Plan: Other fatigue - Plan: POC COVID-19 BinaxNow, POCT rapid strep A, POCT Influenza A/B, CBC, CANCELED: TSH  Malaise - Plan: POC COVID-19 BinaxNow, POCT rapid strep A, POCT Influenza A/B, DG Chest 2 View, CANCELED: TSH  Type 2 diabetes mellitus with hyperosmolarity without coma, without long-term current use of insulin (H[facility]) - Plan: Basic metabolic panel, CANCELED: Hemoglobin A1c, CANCELED: TSH  Mixed hyperlipidemia - Plan: Lipid panel  Cough, unspecified type - Plan: cefdinir (OMNICEF) 300 MG capsule  Sore throat - Plan: cefdinir (OMNICEF) 300 MG capsule  Acute pain of right shoulder - Plan: Ambulatory referral to Orthopedic Surgery  Patient seen today with illness and fatigue for about 5 days.  Strep, COVID, flu all negative Send her for chest x-ray as well For the time being we will start [place] for bronchitis She also notes right shoulder pain for a few months, no acute injury that she is aware.  Referral to orthopedics to evaluate her shoulder  Signed [name], MD Received her chest x-ray, message to patient DG Chest 2 View  Result Date: 05/20/2022 CLINICAL DATA:  Fatigue and malaise. EXAM: CHEST - 2 VIEW COMPARISON:  Chest x-ray dated February 13, 2021. FINDINGS: The heart size and mediastinal contours are within normal limits. Both lungs are clear. The visualized  skeletal structures are unremarkable. IMPRESSION: No active cardiopulmonary disease. Electronically Signed   By: Gwyndolyn Saxon  Marzella Schlein M.D.   On: 05/20/2022 15:47    Received critical labs 11/2- called pt  Home and cell- no answer, LMOM Called back 1pm and was able to reach her.  Acute renal failure- likely due to dehydration from illness.  Please come to ER asap- [facility] ok but cautioned pt she may need to be admitted if not improved. Hold metformin until renal function back to baseline.  She is on her way to the [facility] which is her preferred location   Results for orders placed or performed in visit on 05/20/22  CBC  Result Value Ref Range   WBC 5.0 4.0 - 10.5 K/uL   RBC 3.85 (L) 3.87 - 5.11 Mil/uL   Platelets 307.0 150.0 - 400.0 K/uL   Hemoglobin 11.2 (L) 12.0 - 15.0 g/dL   HCT 34.1 (L) 36.0 - 46.0 %   MCV 88.5 78.0 - 100.0 fl   MCHC 32.8 30.0 - 36.0 g/dL   RDW 14.5 11.5 - 74.2 %  Basic metabolic panel  Result Value Ref Range   Sodium 142 135 - 145 mEq/L   Potassium 4.2 3.5 - 5.1 mEq/L   Chloride 100 96 - 112 mEq/L   CO2 31 19 - 32 mEq/L   Glucose, Bld 132 (H) 70 - 99 mg/dL   BUN 40 (H) 6 - 23 mg/dL   Creatinine, Ser 3.24 (H) 0.40 - 1.20 mg/dL   GFR 13.94 (LL) >60.00 mL/min   Calcium 10.3 8.4 - 10.5 mg/dL  Lipid panel  Result Value Ref Range   Cholesterol 128 0 - 200 mg/dL   Triglycerides 100.0 0.0 - 149.0 mg/dL   HDL 47.10 >39.00 mg/dL   VLDL 20.0 0.0 - 40.0 mg/dL   LDL Cholesterol 60 0 - 99 mg/dL   Total CHOL/HDL Ratio 3    NonHDL 80.41   POC COVID-19 BinaxNow  Result Value Ref Range   SARS Coronavirus 2 Ag Negative Negative  POCT rapid strep A  Result Value Ref Range   Rapid Strep A Screen Negative Negative  POCT Influenza A/B  Result Value Ref Range   Influenza A, POC Negative Negative   Influenza B, POC Negative Negative

## 2022-05-18 ENCOUNTER — Encounter (INDEPENDENT_AMBULATORY_CARE_PROVIDER_SITE_OTHER): Payer: Self-pay

## 2022-05-20 ENCOUNTER — Ambulatory Visit (INDEPENDENT_AMBULATORY_CARE_PROVIDER_SITE_OTHER): Payer: Medicare Other | Admitting: Family Medicine

## 2022-05-20 ENCOUNTER — Encounter: Payer: Self-pay | Admitting: Family Medicine

## 2022-05-20 ENCOUNTER — Ambulatory Visit (HOSPITAL_BASED_OUTPATIENT_CLINIC_OR_DEPARTMENT_OTHER)
Admission: RE | Admit: 2022-05-20 | Discharge: 2022-05-20 | Disposition: A | Discharge status: Home / Self Care | Payer: Medicare Other | Source: Ambulatory Visit | Attending: Family Medicine | Admitting: Family Medicine

## 2022-05-20 VITALS — BP 110/60 | HR 87 | Temp 97.6°F | Resp 18 | Ht 65.0 in | Wt 197.8 lb

## 2022-05-20 DIAGNOSIS — R5381 Other malaise: Secondary | ICD-10-CM

## 2022-05-20 DIAGNOSIS — R5383 Other fatigue: Secondary | ICD-10-CM | POA: Diagnosis not present

## 2022-05-20 DIAGNOSIS — R059 Cough, unspecified: Secondary | ICD-10-CM

## 2022-05-20 DIAGNOSIS — E1100 Type 2 diabetes mellitus with hyperosmolarity without nonketotic hyperglycemic-hyperosmolar coma (NKHHC): Secondary | ICD-10-CM | POA: Diagnosis not present

## 2022-05-20 DIAGNOSIS — J029 Acute pharyngitis, unspecified: Secondary | ICD-10-CM

## 2022-05-20 DIAGNOSIS — E782 Mixed hyperlipidemia: Secondary | ICD-10-CM

## 2022-05-20 DIAGNOSIS — M25511 Pain in right shoulder: Secondary | ICD-10-CM | POA: Diagnosis not present

## 2022-05-20 LAB — POC COVID19 BINAXNOW: SARS Coronavirus 2 Ag: NEGATIVE

## 2022-05-20 LAB — POCT RAPID STREP A (OFFICE): Rapid Strep A Screen: NEGATIVE

## 2022-05-20 LAB — POCT INFLUENZA A/B
Influenza A, POC: NEGATIVE
Influenza B, POC: NEGATIVE

## 2022-05-20 MED ORDER — CEFDINIR 300 MG PO CAPS: 300.0000 mg | ORAL_CAPSULE | Freq: Two times a day (BID) | ORAL | 0 refills | Status: DC

## 2022-05-20 NOTE — Patient Instructions (Signed)
Good to see you today- I hope you feel better soon!  Please go to lab and then have a chest x-ray on the ground floor Omnicef antibiotic in case this is strep- will also cover pneumonia and sinus infection Let me know if you are not feeling better in 2-3 days- Sooner if worse.

## 2022-05-21 ENCOUNTER — Emergency Department (HOSPITAL_BASED_OUTPATIENT_CLINIC_OR_DEPARTMENT_OTHER): Payer: Medicare Other

## 2022-05-21 ENCOUNTER — Observation Stay (HOSPITAL_BASED_OUTPATIENT_CLINIC_OR_DEPARTMENT_OTHER)
Admission: EM | Admit: 2022-05-21 | Discharge: 2022-05-23 | Disposition: A | Discharge status: Home / Self Care | Payer: Medicare Other | Attending: Family Medicine | Admitting: Family Medicine

## 2022-05-21 ENCOUNTER — Telehealth: Payer: Self-pay

## 2022-05-21 ENCOUNTER — Encounter (HOSPITAL_BASED_OUTPATIENT_CLINIC_OR_DEPARTMENT_OTHER): Payer: Self-pay | Admitting: Emergency Medicine

## 2022-05-21 ENCOUNTER — Encounter: Payer: Self-pay | Admitting: Family Medicine

## 2022-05-21 ENCOUNTER — Encounter (HOSPITAL_COMMUNITY): Payer: Self-pay

## 2022-05-21 DIAGNOSIS — E119 Type 2 diabetes mellitus without complications: Secondary | ICD-10-CM

## 2022-05-21 DIAGNOSIS — Z6832 Body mass index (BMI) 32.0-32.9, adult: Secondary | ICD-10-CM | POA: Diagnosis not present

## 2022-05-21 DIAGNOSIS — E876 Hypokalemia: Secondary | ICD-10-CM | POA: Insufficient documentation

## 2022-05-21 DIAGNOSIS — E1122 Type 2 diabetes mellitus with diabetic chronic kidney disease: Secondary | ICD-10-CM | POA: Diagnosis not present

## 2022-05-21 DIAGNOSIS — I129 Hypertensive chronic kidney disease with stage 1 through stage 4 chronic kidney disease, or unspecified chronic kidney disease: Secondary | ICD-10-CM | POA: Insufficient documentation

## 2022-05-21 DIAGNOSIS — Z1152 Encounter for screening for COVID-19: Secondary | ICD-10-CM | POA: Insufficient documentation

## 2022-05-21 DIAGNOSIS — N179 Acute kidney failure, unspecified: Secondary | ICD-10-CM | POA: Diagnosis not present

## 2022-05-21 DIAGNOSIS — Z7984 Long term (current) use of oral hypoglycemic drugs: Secondary | ICD-10-CM | POA: Diagnosis not present

## 2022-05-21 DIAGNOSIS — R5383 Other fatigue: Secondary | ICD-10-CM | POA: Diagnosis present

## 2022-05-21 DIAGNOSIS — I2510 Atherosclerotic heart disease of native coronary artery without angina pectoris: Secondary | ICD-10-CM | POA: Diagnosis present

## 2022-05-21 DIAGNOSIS — E669 Obesity, unspecified: Secondary | ICD-10-CM | POA: Diagnosis present

## 2022-05-21 DIAGNOSIS — I10 Essential (primary) hypertension: Secondary | ICD-10-CM | POA: Diagnosis present

## 2022-05-21 DIAGNOSIS — Z7982 Long term (current) use of aspirin: Secondary | ICD-10-CM | POA: Diagnosis not present

## 2022-05-21 DIAGNOSIS — N281 Cyst of kidney, acquired: Secondary | ICD-10-CM | POA: Diagnosis not present

## 2022-05-21 DIAGNOSIS — H5711 Ocular pain, right eye: Secondary | ICD-10-CM | POA: Diagnosis not present

## 2022-05-21 DIAGNOSIS — Z79899 Other long term (current) drug therapy: Secondary | ICD-10-CM | POA: Insufficient documentation

## 2022-05-21 DIAGNOSIS — R944 Abnormal results of kidney function studies: Secondary | ICD-10-CM | POA: Insufficient documentation

## 2022-05-21 DIAGNOSIS — D649 Anemia, unspecified: Secondary | ICD-10-CM | POA: Insufficient documentation

## 2022-05-21 DIAGNOSIS — N1831 Chronic kidney disease, stage 3a: Secondary | ICD-10-CM | POA: Diagnosis not present

## 2022-05-21 DIAGNOSIS — E0500 Thyrotoxicosis with diffuse goiter without thyrotoxic crisis or storm: Secondary | ICD-10-CM | POA: Diagnosis present

## 2022-05-21 DIAGNOSIS — E782 Mixed hyperlipidemia: Secondary | ICD-10-CM | POA: Diagnosis present

## 2022-05-21 DIAGNOSIS — N183 Chronic kidney disease, stage 3 unspecified: Secondary | ICD-10-CM | POA: Diagnosis present

## 2022-05-21 LAB — BASIC METABOLIC PANEL
Anion gap: 9 (ref 5–15)
BUN: 40 mg/dL — ABNORMAL HIGH (ref 6–23)
BUN: 48 mg/dL — ABNORMAL HIGH (ref 8–23)
CO2: 31 mEq/L (ref 19–32)
CO2: 28 mmol/L (ref 22–32)
Calcium: 10.3 mg/dL (ref 8.4–10.5)
Calcium: 9.1 mg/dL (ref 8.9–10.3)
Chloride: 100 mEq/L (ref 96–112)
Chloride: 98 mmol/L (ref 98–111)
Creatinine, Ser: 3.24 mg/dL — ABNORMAL HIGH (ref 0.40–1.20)
Creatinine, Ser: 3.42 mg/dL — ABNORMAL HIGH (ref 0.44–1.00)
GFR, Estimated: 14 mL/min — ABNORMAL LOW (ref >60)
GFR: 13.94 mL/min — CL (ref >60.00)
Glucose, Bld: 132 mg/dL — ABNORMAL HIGH (ref 70–99)
Glucose, Bld: 105 mg/dL — ABNORMAL HIGH (ref 70–99)
Potassium: 4.2 mEq/L (ref 3.5–5.1)
Potassium: 4.2 mmol/L (ref 3.5–5.1)
Sodium: 142 mEq/L (ref 135–145)
Sodium: 135 mmol/L (ref 135–145)

## 2022-05-21 LAB — URINALYSIS, MICROSCOPIC (REFLEX): WBC, UA: NONE SEEN WBC/hpf (ref 0–5)

## 2022-05-21 LAB — CBC WITH DIFFERENTIAL/PLATELET
Abs Immature Granulocytes: 0.01 10*3/uL (ref 0.00–0.07)
Basophils Absolute: 0 10*3/uL (ref 0.0–0.1)
Basophils Relative: 0 %
Eosinophils Absolute: 0.1 10*3/uL (ref 0.0–0.5)
Eosinophils Relative: 2 %
HCT: 32 % — ABNORMAL LOW (ref 36.0–46.0)
Hemoglobin: 10.3 g/dL — ABNORMAL LOW (ref 12.0–15.0)
Immature Granulocytes: 0 %
Lymphocytes Relative: 30 %
Lymphs Abs: 1.5 10*3/uL (ref 0.7–4.0)
MCH: 28.8 pg (ref 26.0–34.0)
MCHC: 32.2 g/dL (ref 30.0–36.0)
MCV: 89.4 fL (ref 80.0–100.0)
Monocytes Absolute: 0.7 10*3/uL (ref 0.1–1.0)
Monocytes Relative: 15 %
Neutro Abs: 2.6 10*3/uL (ref 1.7–7.7)
Neutrophils Relative %: 53 %
Platelets: 169 10*3/uL (ref 150–400)
RBC: 3.58 MIL/uL — ABNORMAL LOW (ref 3.87–5.11)
RDW: 13.9 % (ref 11.5–15.5)
WBC: 5 10*3/uL (ref 4.0–10.5)
nRBC: 0 % (ref 0.0–0.2)

## 2022-05-21 LAB — HEPATIC FUNCTION PANEL
ALT: 15 U/L (ref 0–44)
AST: 28 U/L (ref 15–41)
Albumin: 3.9 g/dL (ref 3.5–5.0)
Alkaline Phosphatase: 65 U/L (ref 38–126)
Bilirubin, Direct: 0.2 mg/dL (ref 0.0–0.2)
Indirect Bilirubin: 0.8 mg/dL (ref 0.3–0.9)
Total Bilirubin: 1 mg/dL (ref 0.3–1.2)
Total Protein: 8.3 g/dL — ABNORMAL HIGH (ref 6.5–8.1)

## 2022-05-21 LAB — URINALYSIS, ROUTINE W REFLEX MICROSCOPIC
Bilirubin Urine: NEGATIVE
Glucose, UA: NEGATIVE mg/dL
Ketones, ur: NEGATIVE mg/dL
Leukocytes,Ua: NEGATIVE
Nitrite: NEGATIVE
Protein, ur: NEGATIVE mg/dL
Specific Gravity, Urine: 1.01 (ref 1.005–1.030)
pH: 6 (ref 5.0–8.0)

## 2022-05-21 LAB — LIPID PANEL
Cholesterol: 128 mg/dL (ref 0–200)
HDL: 47.1 mg/dL (ref >39.00)
LDL Cholesterol: 60 mg/dL (ref 0–99)
NonHDL: 80.41
Total CHOL/HDL Ratio: 3
Triglycerides: 100 mg/dL (ref 0.0–149.0)
VLDL: 20 mg/dL (ref 0.0–40.0)

## 2022-05-21 LAB — CBC
HCT: 34.1 % — ABNORMAL LOW (ref 36.0–46.0)
Hemoglobin: 11.2 g/dL — ABNORMAL LOW (ref 12.0–15.0)
MCHC: 32.8 g/dL (ref 30.0–36.0)
MCV: 88.5 fl (ref 78.0–100.0)
Platelets: 307 10*3/uL (ref 150.0–400.0)
RBC: 3.85 Mil/uL — ABNORMAL LOW (ref 3.87–5.11)
RDW: 14.5 % (ref 11.5–15.5)
WBC: 5 10*3/uL (ref 4.0–10.5)

## 2022-05-21 LAB — LIPASE, BLOOD: Lipase: 47 U/L (ref 11–51)

## 2022-05-21 MED ORDER — SODIUM CHLORIDE 0.9 % IV BOLUS
1000.0000 mL | Freq: Once | INTRAVENOUS | Status: AC
  Administered 2022-05-21: 1000 mL via INTRAVENOUS

## 2022-05-21 MED ORDER — ONDANSETRON 4 MG PO TBDP: 4.0000 mg | ORAL_TABLET | Freq: Once | ORAL | Status: DC

## 2022-05-21 NOTE — ED Provider Notes (Signed)
[facility] EMERGENCY DEPARTMENT Provider Note   CSN: [id number] Arrival date & time: 05/21/22  1654     History  Chief Complaint  Patient presents with   Abnormal Lab    Emily Wood is a 71 y.o. female.  With past medical history of Graves' disease, type 2 diabetes, hypertension, reported CKD stage III who presents to the emergency department with abnormal labs.  Patient states that her primary care doctor called her this morning and told her to come to the emergency department because her "kidneys were not happy."  She states that she began feeling unwell on 05/15/2022.  She describes being generally fatigued and having flulike symptoms.  She states that she felt like she had a cold.  States that she had multiple nights of night sweats but never took her temperature.  During this time she has had decreased p.o. intake including liquids.  States that she also stopped taking her medications from 1027/23 until today when she took her daily medications.  States that she is only peed 1 time today and fingers this is because she is not drinking much water.  She denies having any abdominal pain, nausea, vomiting, diarrhea, chest pain or shortness of breath, dysuria or hematuria.  She has chronic lymphedema and states that her lower extremity swelling has actually decreased over the past week. When asked about her CKD she is unaware that she has been diagnosed with this.  On labs it appears that her creatinine has been around 1.3 this year.   Abnormal Lab      Home Medications Prior to Admission medications   Medication Sig Start Date End Date Taking? Authorizing Provider  Accu-Chek Softclix Lancets lancets Use to check blood sugar once day.  Dx code: E11.9 04/28/21   Copland, Gay Filler, MD  Alpha-Lipoic Acid 300 MG CAPS Take 2 capsules by mouth daily.    [provider]  amLODipine (NORVASC) 5 MG tablet TAKE 1 AND 1/2 TABLETS BY MOUTH  DAILY 04/21/22   Copland,  Gay Filler, MD  aspirin 81 MG tablet Take 81 mg by mouth daily.    [provider]  atorvastatin (LIPITOR) 40 MG tablet TAKE 1 TABLET BY MOUTH DAILY 03/03/22   Tobb, Kardie, DO  Biotin w/ Vitamins C & E (HAIR/SKIN/NAILS PO) Take 6,000 mcg by mouth daily. 2 tabs daily    [provider]  carvedilol (COREG) 12.5 MG tablet TAKE 1 TABLET BY MOUTH  TWICE DAILY 12/16/21   Tobb, Kardie, DO  cefdinir (OMNICEF) 300 MG capsule Take 1 capsule (300 mg total) by mouth 2 (two) times daily. 05/20/22   Copland, Gay Filler, MD  Cholecalciferol (VITAMIN D3 PO) Take 125 mg by mouth daily.    [provider]  cloNIDine (CATAPRES) 0.2 MG tablet TAKE 1 TABLET BY MOUTH 3 TIMES  DAILY 04/21/22   Copland, Gay Filler, MD  Cobalamin Combinations (B-12) [phone] MCG SUBL B-12 Plus 5,000 mcg-100 mcg sublingual tablet    [provider]  diclofenac Sodium (VOLTAREN) 1 % GEL Apply 4 g topically 4 (four) times daily. Patient taking differently: Apply 4 g topically as needed. 08/04/21   Saguier, Percell Miller, PA-C  ferrous sulfate 325 (65 FE) MG tablet Take 325 mg by mouth daily with breakfast.    [provider]  furosemide (LASIX) 40 MG tablet Take 1 tablet (40 mg total) by mouth once a week. 03/06/22   Tobb, Kardie, DO  gabapentin (NEURONTIN) 400 MG capsule TAKE 2 CAPSULES BY  MOUTH 3 TIMES DAILY 05/04/22   Copland, Gay Filler, MD  GEMTESA 75 MG TABS Take 1 tablet by mouth daily. 04/08/22   [provider]  glucose blood (ACCU-CHEK GUIDE) test strip Use to check blood sugar once day.  Dx code: E11.9 04/28/21   Copland, Gay Filler, MD  hydrochlorothiazide (HYDRODIURIL) 25 MG tablet TAKE 1 TABLET BY MOUTH DAILY 04/21/22   Copland, Gay Filler, MD  irbesartan (AVAPRO) 300 MG tablet TAKE 1 TABLET BY MOUTH DAILY 02/02/22   Copland, Gay Filler, MD  isosorbide mononitrate (IMDUR) 30 MG 24 hr tablet Take 1 tablet (30 mg total) by mouth daily. 03/06/22   Tobb, Kardie, DO  metFORMIN (GLUCOPHAGE) 1000 MG  tablet TAKE 1 TABLET BY MOUTH TWICE  DAILY WITH MEALS 12/16/21   Copland, Gay Filler, MD  methimazole (TAPAZOLE) 5 MG tablet Take 2.5 mg by mouth daily. 10/02/20   [provider]  methocarbamol (ROBAXIN) 500 MG tablet Take 1 tablet (500 mg total) by mouth every 8 (eight) hours as needed for muscle spasms. 10/01/21   Copland, Gay Filler, MD  Multiple Vitamin (MULTIVITAMIN ADULT PO) Take 1 tablet by mouth daily.    [provider]  nystatin cream (MYCOSTATIN) Apply 1 application topically 2 (two) times daily. Patient taking differently: Apply 1 application  topically 2 (two) times daily as needed. 10/07/20   Malachy Mood, MD  OZEMPIC, 0.25 OR 0.5 MG/DOSE, 2 MG/3ML [facility]  10/15/21   [provider]  potassium chloride SA (KLOR-CON M) 20 MEQ tablet TAKE 1 TABLET BY MOUTH DAILY 04/21/22   Copland, Gay Filler, MD  TURMERIC PO Take 500 mg by mouth daily.    [provider]  UNABLE TO FIND daily. Med Name: Green Lipped Muscle oil GLX3    [provider]  vitamin C (ASCORBIC ACID) 500 MG tablet Take 500 mg by mouth daily.    [provider]      Allergies    Patient has no known allergies.    Review of Systems   Review of Systems  Constitutional:  Positive for appetite change, chills, diaphoresis and fatigue.  All other systems reviewed and are negative.   Physical Exam Updated Vital Signs BP (!) 171/66   Pulse 74   Temp 98.4 F (36.9 C) (Oral)   Resp 20   Ht '5\' 5"'$  (1.651 m)   Wt 89 kg   SpO2 98%   BMI 32.65 kg/m  Physical Exam Vitals and nursing note reviewed.  Constitutional:      General: She is not in acute distress.    Appearance: She is obese. She is ill-appearing. She is not toxic-appearing.     Comments: Tired appearing  HENT:     Head: Normocephalic and atraumatic.     Mouth/Throat:     Mouth: Mucous membranes are moist.     Pharynx: Oropharynx is clear.  Eyes:     General: No scleral icterus.    Extraocular Movements:  Extraocular movements intact.     Pupils: Pupils are equal, round, and reactive to light.     Comments: Pale conjunctive a  Cardiovascular:     Rate and Rhythm: Normal rate and regular rhythm.     Pulses: Normal pulses.     Heart sounds: No murmur heard. Pulmonary:     Effort: Pulmonary effort is normal. No respiratory distress.     Breath sounds: Normal breath sounds.  Abdominal:     General: Bowel sounds are normal. There is no  distension.     Palpations: Abdomen is soft.     Tenderness: There is no abdominal tenderness. There is no guarding.  Musculoskeletal:        General: Normal range of motion.     Cervical back: Neck supple.     Right lower leg: Edema present.     Left lower leg: Edema present.     Comments: Lymphedema in bilateral lower extremities  Skin:    General: Skin is warm and dry.     Capillary Refill: Capillary refill takes less than 2 seconds.     Findings: No rash.  Neurological:     General: No focal deficit present.     Mental Status: She is alert and oriented to person, place, and time. Mental status is at baseline.  Psychiatric:        Mood and Affect: Mood normal.        Behavior: Behavior normal.        Thought Content: Thought content normal.        Judgment: Judgment normal.     ED Results / Procedures / Treatments   Labs (all labs ordered are listed, but only abnormal results are displayed) Labs Reviewed  BASIC METABOLIC PANEL - Abnormal; Notable for the following components:      Result Value   Glucose, Bld 105 (*)    BUN 48 (*)    Creatinine, Ser 3.42 (*)    GFR, Estimated 14 (*)    All other components within normal limits  HEPATIC FUNCTION PANEL - Abnormal; Notable for the following components:   Total Protein 8.3 (*)    All other components within normal limits  CBC WITH DIFFERENTIAL/PLATELET - Abnormal; Notable for the following components:   RBC 3.58 (*)    Hemoglobin 10.3 (*)    HCT 32.0 (*)    All other components within  normal limits  URINALYSIS, ROUTINE W REFLEX MICROSCOPIC - Abnormal; Notable for the following components:   Hgb urine dipstick TRACE (*)    All other components within normal limits  URINALYSIS, MICROSCOPIC (REFLEX) - Abnormal; Notable for the following components:   Bacteria, UA RARE (*)    All other components within normal limits  LIPASE, BLOOD  CBG MONITORING, ED    EKG None  Radiology US Renal  Result Date: 05/21/2022 CLINICAL DATA:  Renal dysfunction EXAM: RENAL / URINARY TRACT ULTRASOUND COMPLETE COMPARISON:  MR abdomen done on 05/11/2022 FINDINGS: Right Kidney: Renal measurements: 11.8 x 5.5 x 5.6 cm = volume: 188 mL. There is no hydronephrosis. There is increased cortical echogenicity. There is 1.5 cm cyst in the upper pole. There are small hyperechoic foci without acoustic shadowing, possibly vascular calcifications. Left Kidney: Renal measurements: 11.2 x 5.1 x 4.5 cm = volume: 134 mL. There is no hydronephrosis. There is increased cortical echogenicity. There is 1.8 cm cyst. There are small hyperechoic foci without acoustic shadowing, possibly partial volume averaging of renal sinus fat. Bladder: Appears normal for degree of bladder distention. Other: None. IMPRESSION: There is no hydronephrosis. There is increased cortical echogenicity suggesting medical renal disease. Bilateral renal cysts. Electronically Signed   By: Elmer Picker M.D.   On: 05/21/2022 19:41   DG Chest 2 View  Result Date: 05/20/2022 CLINICAL DATA:  Fatigue and malaise. EXAM: CHEST - 2 VIEW COMPARISON:  Chest x-ray dated February 13, 2021. FINDINGS: The heart size and mediastinal contours are within normal limits. Both lungs are clear. The visualized skeletal structures are unremarkable. IMPRESSION: No  active cardiopulmonary disease. Electronically Signed   By: Titus Dubin M.D.   On: 05/20/2022 15:47    Procedures Procedures    Medications Ordered in ED Medications  sodium chloride 0.9 % bolus 1,000  mL (0 mLs Intravenous Stopped 05/21/22 1958)    ED Course/ Medical Decision Making/ A&P                           Medical Decision Making Amount and/or Complexity of Data Reviewed Labs: ordered. Radiology: ordered.  Risk Decision regarding hospitalization.   Dr Nevada Crane, hospitalist accepts patient for tx [facility] for admission and work up of AKI This patient presents to the ED with chief complaint(s) of abnormal labs with pertinent past medical history of hypertension, diabetes, CKD which further complicates the presenting complaint. The complaint involves an extensive differential diagnosis and also carries with it a high risk of complications and morbidity.    The differential diagnosis includes AKI of unclear etiology  Additional history obtained: Additional history obtained from spouse Records reviewed previous admission documents, Care Everywhere/External Records, and Primary Care Documents  ED Course and Reassessment: 71 year old female who presents to the emergency department with elevated creatinine.  On initial exam she is a tired appearing female.  She is nonseptic, nontoxic in appearance.  She is alert and oriented.  Lymphedema and pale conjunctive a but otherwise no acute findings on exam. Labs were ordered including a UA and renal ultrasound and EKG.  EKG without hyperacute T waves or other abnormalities. Renal ultrasound with bilateral renal cysts which are known and monitored. Labs show that she does have a ongoing AKI.  Her creatinine is increased from yesterday up to 3.42 with a BUN of 48.  She has microscopic hematuria on her UA.  Given a liter of fluids for possible prerenal AKI although her BUN to creatinine ratio does not exactly align with this.  Question whether she may have some sort of IgA nephropathy as she was recently ill?  No potassium derangement.  She will need nephrology consult and ongoing work-up of this elevated creatinine.  I have talked her about  these results and need for admission for ongoing work-up.  She is agreeable with this plan.  I consulted and spoke with Dr. Nevada Crane, hospitalist who agrees to admit the patient for ongoing work-up.  Independent labs interpretation:  The following labs were independently interpreted: Notable labs include BUN 48, creatinine 3.42.  Potassium is 4.2.  Anemia at baseline.  Microscopic hematuria  Independent visualization of imaging: - I independently visualized the following imaging with scope of interpretation limited to determining acute life threatening conditions related to emergency care: Renal ultrasound, which revealed bilateral renal cysts  Consultation: - Consulted or discussed management/test interpretation w/ external professional: Hospitalist, Dr. Nevada Crane who accepts the patient for transfer of care to [facility] for admission  Consideration for admission or further workup: Admission for further work-up of AKI Social Determinants of health: None identified Final Clinical Impression(s) / ED Diagnoses Final diagnoses:  AKI (acute kidney injury) [facility])    Rx / [place] Orders ED Discharge Orders     None         Mickie Hillier, PA-C 05/21/22 2302    Fransico Meadow, MD 05/22/22 1126

## 2022-05-21 NOTE — ED Notes (Signed)
Did not take any antihypertensives today.

## 2022-05-21 NOTE — Telephone Encounter (Signed)
Message received, I am working on contacting patient

## 2022-05-21 NOTE — ED Notes (Signed)
Patient ambulated to restroom, did not collect sample. Will obtain sample at a later time

## 2022-05-21 NOTE — Telephone Encounter (Signed)
I have received a phone call from [facility] with a critical for the pt.   GFR: Low @ 13.94   Provider Notified Verbal and message sent.

## 2022-05-21 NOTE — ED Triage Notes (Signed)
Pt states she was seen yesterday at PCP and told today she has abnormal kidney labs. Reports decreased po intake since 10/27 due to feeling like she has a cold. Denies sx today.

## 2022-05-21 NOTE — ED Notes (Signed)
Attempted IV access, unsuccessful.  

## 2022-05-22 ENCOUNTER — Encounter (HOSPITAL_COMMUNITY): Payer: Self-pay | Admitting: Internal Medicine

## 2022-05-22 DIAGNOSIS — N179 Acute kidney failure, unspecified: Secondary | ICD-10-CM | POA: Diagnosis not present

## 2022-05-22 DIAGNOSIS — R5383 Other fatigue: Secondary | ICD-10-CM | POA: Diagnosis present

## 2022-05-22 DIAGNOSIS — Z7982 Long term (current) use of aspirin: Secondary | ICD-10-CM | POA: Diagnosis not present

## 2022-05-22 DIAGNOSIS — I129 Hypertensive chronic kidney disease with stage 1 through stage 4 chronic kidney disease, or unspecified chronic kidney disease: Secondary | ICD-10-CM | POA: Diagnosis not present

## 2022-05-22 DIAGNOSIS — I2510 Atherosclerotic heart disease of native coronary artery without angina pectoris: Secondary | ICD-10-CM | POA: Diagnosis not present

## 2022-05-22 DIAGNOSIS — N1831 Chronic kidney disease, stage 3a: Secondary | ICD-10-CM | POA: Diagnosis not present

## 2022-05-22 DIAGNOSIS — Z79899 Other long term (current) drug therapy: Secondary | ICD-10-CM | POA: Diagnosis not present

## 2022-05-22 DIAGNOSIS — E876 Hypokalemia: Secondary | ICD-10-CM | POA: Diagnosis not present

## 2022-05-22 DIAGNOSIS — D649 Anemia, unspecified: Secondary | ICD-10-CM | POA: Diagnosis not present

## 2022-05-22 DIAGNOSIS — R944 Abnormal results of kidney function studies: Secondary | ICD-10-CM | POA: Diagnosis not present

## 2022-05-22 DIAGNOSIS — Z1152 Encounter for screening for COVID-19: Secondary | ICD-10-CM | POA: Diagnosis not present

## 2022-05-22 DIAGNOSIS — E669 Obesity, unspecified: Secondary | ICD-10-CM | POA: Diagnosis not present

## 2022-05-22 DIAGNOSIS — E1122 Type 2 diabetes mellitus with diabetic chronic kidney disease: Secondary | ICD-10-CM | POA: Diagnosis not present

## 2022-05-22 DIAGNOSIS — H5711 Ocular pain, right eye: Secondary | ICD-10-CM | POA: Insufficient documentation

## 2022-05-22 DIAGNOSIS — Z6832 Body mass index (BMI) 32.0-32.9, adult: Secondary | ICD-10-CM | POA: Diagnosis not present

## 2022-05-22 DIAGNOSIS — E782 Mixed hyperlipidemia: Secondary | ICD-10-CM | POA: Diagnosis not present

## 2022-05-22 DIAGNOSIS — Z7984 Long term (current) use of oral hypoglycemic drugs: Secondary | ICD-10-CM | POA: Diagnosis not present

## 2022-05-22 LAB — BASIC METABOLIC PANEL
Anion gap: 9 (ref 5–15)
BUN: 34 mg/dL — ABNORMAL HIGH (ref 8–23)
CO2: 26 mmol/L (ref 22–32)
Calcium: 8.9 mg/dL (ref 8.9–10.3)
Chloride: 105 mmol/L (ref 98–111)
Creatinine, Ser: 1.65 mg/dL — ABNORMAL HIGH (ref 0.44–1.00)
GFR, Estimated: 33 mL/min — ABNORMAL LOW (ref >60)
Glucose, Bld: 122 mg/dL — ABNORMAL HIGH (ref 70–99)
Potassium: 3.4 mmol/L — ABNORMAL LOW (ref 3.5–5.1)
Sodium: 140 mmol/L (ref 135–145)

## 2022-05-22 LAB — HEMOGLOBIN A1C
Hgb A1c MFr Bld: 6 % — ABNORMAL HIGH (ref 4.8–5.6)
Mean Plasma Glucose: 125.5 mg/dL

## 2022-05-22 LAB — SARS CORONAVIRUS 2 BY RT PCR: SARS Coronavirus 2 by RT PCR: NEGATIVE

## 2022-05-22 LAB — GLUCOSE, CAPILLARY
Glucose-Capillary: 109 mg/dL — ABNORMAL HIGH (ref 70–99)
Glucose-Capillary: 109 mg/dL — ABNORMAL HIGH (ref 70–99)

## 2022-05-22 LAB — CBG MONITORING, ED: Glucose-Capillary: 94 mg/dL (ref 70–99)

## 2022-05-22 MED ORDER — METFORMIN HCL 500 MG PO TABS: 1000.0000 mg | ORAL_TABLET | ORAL | Status: DC

## 2022-05-22 MED ORDER — POLYVINYL ALCOHOL 1.4 % OP SOLN: 1.0000 [drp] | OPHTHALMIC | Status: DC | PRN

## 2022-05-22 MED ORDER — GATIFLOXACIN 0.5 % OP SOLN
1.0000 [drp] | Freq: Four times a day (QID) | OPHTHALMIC | Status: DC
  Administered 2022-05-22 – 2022-05-23 (×5): 1 [drp] via OPHTHALMIC
  Filled 2022-05-22: qty 2.5

## 2022-05-22 MED ORDER — FERROUS SULFATE 325 (65 FE) MG PO TABS
325.0000 mg | ORAL_TABLET | ORAL | Status: AC
  Administered 2022-05-22: 325 mg via ORAL
  Filled 2022-05-22: qty 1

## 2022-05-22 MED ORDER — MIRABEGRON ER 25 MG PO TB24
25.0000 mg | ORAL_TABLET | Freq: Every day | ORAL | Status: DC
  Administered 2022-05-23: 25 mg via ORAL
  Filled 2022-05-22 (×2): qty 1

## 2022-05-22 MED ORDER — METHIMAZOLE 5 MG PO TABS
2.5000 mg | ORAL_TABLET | Freq: Every day | ORAL | Status: DC
  Administered 2022-05-22 – 2022-05-23 (×2): 2.5 mg via ORAL
  Filled 2022-05-22 (×2): qty 1

## 2022-05-22 MED ORDER — PREDNISOLONE ACETATE 1 % OP SUSP
1.0000 [drp] | Freq: Four times a day (QID) | OPHTHALMIC | Status: DC
  Administered 2022-05-22 – 2022-05-23 (×5): 1 [drp] via OPHTHALMIC
  Filled 2022-05-22: qty 5

## 2022-05-22 MED ORDER — LACTATED RINGERS IV SOLN: INTRAVENOUS | Status: DC

## 2022-05-22 MED ORDER — ATORVASTATIN CALCIUM 40 MG PO TABS
40.0000 mg | ORAL_TABLET | Freq: Every day | ORAL | Status: AC
  Administered 2022-05-22: 40 mg via ORAL
  Filled 2022-05-22: qty 1

## 2022-05-22 MED ORDER — AMLODIPINE BESYLATE 5 MG PO TABS
7.5000 mg | ORAL_TABLET | Freq: Every day | ORAL | Status: DC
  Administered 2022-05-22 – 2022-05-23 (×2): 7.5 mg via ORAL
  Filled 2022-05-22 (×2): qty 2

## 2022-05-22 MED ORDER — ASPIRIN 81 MG PO CHEW
81.0000 mg | CHEWABLE_TABLET | Freq: Every day | ORAL | Status: DC
  Administered 2022-05-22 – 2022-05-23 (×2): 81 mg via ORAL
  Filled 2022-05-22 (×2): qty 1

## 2022-05-22 MED ORDER — PREDNISOLONE ACETATE 1 % OP SUSP
1.0000 [drp] | Freq: Three times a day (TID) | OPHTHALMIC | Status: DC
  Filled 2022-05-22: qty 5

## 2022-05-22 MED ORDER — DOCUSATE SODIUM 100 MG PO CAPS
100.0000 mg | ORAL_CAPSULE | Freq: Two times a day (BID) | ORAL | Status: DC
  Filled 2022-05-22 (×3): qty 1

## 2022-05-22 MED ORDER — CLONIDINE HCL 0.1 MG PO TABS
0.1000 mg | ORAL_TABLET | Freq: Once | ORAL | Status: AC
  Administered 2022-05-22: 0.1 mg via ORAL
  Filled 2022-05-22: qty 1

## 2022-05-22 MED ORDER — INSULIN ASPART 100 UNIT/ML IJ SOLN
0.0000 [IU] | Freq: Three times a day (TID) | INTRAMUSCULAR | Status: DC
  Administered 2022-05-23: 1 [IU] via SUBCUTANEOUS

## 2022-05-22 MED ORDER — ACETAMINOPHEN 650 MG RE SUPP: 650.0000 mg | Freq: Four times a day (QID) | RECTAL | Status: DC | PRN

## 2022-05-22 MED ORDER — CARVEDILOL 12.5 MG PO TABS
12.5000 mg | ORAL_TABLET | Freq: Two times a day (BID) | ORAL | Status: DC
  Administered 2022-05-22 – 2022-05-23 (×2): 12.5 mg via ORAL
  Filled 2022-05-22 (×3): qty 1

## 2022-05-22 MED ORDER — METFORMIN HCL 500 MG PO TABS: 750.0000 mg | ORAL_TABLET | Freq: Once | ORAL | Status: DC

## 2022-05-22 MED ORDER — POLYETHYLENE GLYCOL 3350 17 G PO PACK: 17.0000 g | PACK | Freq: Every day | ORAL | Status: DC | PRN

## 2022-05-22 MED ORDER — OFLOXACIN 0.3 % OP SOLN
1.0000 [drp] | Freq: Three times a day (TID) | OPHTHALMIC | Status: DC
  Filled 2022-05-22: qty 5

## 2022-05-22 MED ORDER — ACETAMINOPHEN 325 MG PO TABS: 650.0000 mg | ORAL_TABLET | Freq: Four times a day (QID) | ORAL | Status: DC | PRN

## 2022-05-22 MED ORDER — ONDANSETRON HCL 4 MG/2ML IJ SOLN: 4.0000 mg | Freq: Four times a day (QID) | INTRAMUSCULAR | Status: DC | PRN

## 2022-05-22 MED ORDER — ISOSORBIDE MONONITRATE ER 30 MG PO TB24: 30.0000 mg | ORAL_TABLET | ORAL | Status: AC

## 2022-05-22 MED ORDER — AMLODIPINE BESYLATE 5 MG PO TABS
5.0000 mg | ORAL_TABLET | Freq: Once | ORAL | Status: AC
  Administered 2022-05-22: 5 mg via ORAL
  Filled 2022-05-22: qty 1

## 2022-05-22 MED ORDER — HYDRALAZINE HCL 20 MG/ML IJ SOLN: 5.0000 mg | INTRAMUSCULAR | Status: DC | PRN

## 2022-05-22 MED ORDER — BISACODYL 5 MG PO TBEC: 5.0000 mg | DELAYED_RELEASE_TABLET | Freq: Every day | ORAL | Status: DC | PRN

## 2022-05-22 MED ORDER — ONDANSETRON HCL 4 MG PO TABS: 4.0000 mg | ORAL_TABLET | Freq: Four times a day (QID) | ORAL | Status: DC | PRN

## 2022-05-22 MED ORDER — CLONIDINE HCL 0.1 MG PO TABS
0.2000 mg | ORAL_TABLET | Freq: Three times a day (TID) | ORAL | Status: DC
  Administered 2022-05-22 – 2022-05-23 (×3): 0.2 mg via ORAL
  Filled 2022-05-22 (×3): qty 2

## 2022-05-22 MED ORDER — ATORVASTATIN CALCIUM 40 MG PO TABS
40.0000 mg | ORAL_TABLET | Freq: Every day | ORAL | Status: DC
  Administered 2022-05-23: 40 mg via ORAL
  Filled 2022-05-22: qty 1

## 2022-05-22 MED ORDER — SODIUM CHLORIDE 0.9 % IV SOLN: INTRAVENOUS | Status: DC

## 2022-05-22 MED ORDER — ENOXAPARIN SODIUM 40 MG/0.4ML IJ SOSY
40.0000 mg | PREFILLED_SYRINGE | INTRAMUSCULAR | Status: DC
  Filled 2022-05-22 (×2): qty 0.4

## 2022-05-22 MED ORDER — GUAIFENESIN 100 MG/5ML PO LIQD
5.0000 mL | ORAL | Status: DC | PRN
  Administered 2022-05-22: 5 mL via ORAL
  Filled 2022-05-22: qty 15

## 2022-05-22 NOTE — ED Notes (Signed)
Pt given apple juice and graham crackers

## 2022-05-22 NOTE — ED Notes (Signed)
Report given to Legrand Como with [facility]. Pt will be ED to ED transfer to [facility]

## 2022-05-22 NOTE — Progress Notes (Signed)
Patient has arrived to the floor  

## 2022-05-22 NOTE — Care Management Obs Status (Cosign Needed)
[place] NOTIFICATION   Patient Details  Name: Emily Wood MRN: [id number] Date of Birth: 09/06/1950   Medicare Observation Status Notification Given:  Yes    Curlene Labrum, RN 05/22/2022, 3:54 PM

## 2022-05-22 NOTE — ED Notes (Signed)
Patient arrived from [facility], no s/s of distress, is AOX4, resting in bed, able to verbalize her needs, will continue to monitor.

## 2022-05-22 NOTE — Care Management CC44 (Cosign Needed)
Condition Code 44 Documentation Completed  Patient Details  Name: Emily Wood MRN: [id number] Date of Birth: [birth year]   Condition Code 44 given:  Yes Patient signature on Condition Code 44 notice:  Yes Documentation of 2 MD's agreement:  Yes Code 44 added to claim:  Yes    Curlene Labrum, RN 05/22/2022, 4:25 PM

## 2022-05-22 NOTE — H&P (Signed)
History and Physical    Patient: Emily Wood TSV:[id number] DOB: [birth year] DOA: 05/21/2022 DOS: the patient was seen and examined on 05/22/2022 PCP: Copland, Gay Filler, MD  Patient coming from: Home - lives with Emily Wood; NOK: Emily Wood, [phone]   Chief Complaint: Renal dysfunction (sent by PCP)  HPI: Emily Wood is a 71 y.o. female with medical history significant of DM,  Graves disease, HTN, and HLD presenting with renal dysfunction.  She reports that she thought she had a cold or flu and went to see her PCP.  She had COVID/flu/strep tests that were negative. She thought maybe it was bronchitis and she was only able to take it one time - but she only had one dose before her PCP told her to come to the ER due to renal failure.  Symptoms started on 10/27 and she was feeling fatigued.  She had a blood test a week before for routine labs, doesn't know about results.  Some cough, no fever.  She was feeling light-headed with standing, very transiently.  She hasn't been eating and drinking well since 10/27 due to anorexia associated with symptoms.  She is hungry now.  She is on Ozempic, missed her dose this week because she didn't have the energy.  She feels ok now.  Her R eye feels like someone punched her and has been tearing overnight; she had B cataract surgery on 10/10.    ER Course:  [facility] to [facility] transfer, per Dr. Nevada Crane:  71 year old female presents to [facility] ED sent by her PCP who saw her yesterday.  Labs obtained at that time revealed elevated creatinine 3.42 from baseline of 1.3.  Her creatinine has been progressively uptrending.  She was sent to the ED for further evaluation.  She endorses fatigue and flulike symptoms since May 15, 2022.  Her symptoms have persisted with associated decrease in oral intake, poor appetite, and decrease of urine output.  She denies hematuria.    Work-up in the ED revealed creatinine of 3.4 with elevated BUN.  She had trace hemoglobin in urine  analysis.  Renal ultrasound is negative for hydronephrosis.  Bilateral renal cysts were noted.      Review of Systems: As mentioned in the history of present illness. All other systems reviewed and are negative. Past Medical History:  Diagnosis Date   Arthritis    Diabetes mellitus without complication ([place])    Family history of adverse reaction to anesthesia    PONV   Graves disease 04/14/2020   Hypertension    Mixed hyperlipidemia 02/20/2021   Past Surgical History:  Procedure Laterality Date   CATARACT EXTRACTION W/PHACO Right 04/14/2022   Procedure: CATARACT EXTRACTION PHACO AND INTRAOCULAR LENS PLACEMENT (IOC) RIGHT DIABETIC 10.93 00:53.0;  Surgeon: Birder Robson, MD;  Location: [place];  Service: Ophthalmology;  Laterality: Right;   CATARACT EXTRACTION W/PHACO Left 04/28/2022   Procedure: CATARACT EXTRACTION PHACO AND INTRAOCULAR LENS PLACEMENT (IOC) LEFT DIABETIC 6.05 00:45.7;  Surgeon: Birder Robson, MD;  Location: [place];  Service: Ophthalmology;  Laterality: Left;  Diabetic   CHOLECYSTECTOMY     HERNIA REPAIR     OTHER SURGICAL HISTORY     scar tissue removal from bowels   PARTIAL HYSTERECTOMY     Still has ovaries   UTERINE FIBROID SURGERY     Social History:  reports that she has never smoked. She has never used smokeless tobacco. She reports that she does not drink alcohol and does not use drugs.  No  Known Allergies  Family History  Problem Relation Age of Onset   Hyperlipidemia Mother    Cancer Sister    Hyperlipidemia Maternal Aunt    Diabetes Maternal Aunt    Breast cancer Cousin    Diabetes Maternal Uncle     Prior to Admission medications   Medication Sig Start Date End Date Taking? Authorizing Provider  Alpha-Lipoic Acid 300 MG CAPS Take 2 capsules by mouth daily.   Yes [provider]  amLODipine (NORVASC) 5 MG tablet TAKE 1 AND 1/2 TABLETS BY MOUTH  DAILY 04/21/22  Yes Copland, Gay Filler, MD  aspirin 81 MG  tablet Take 81 mg by mouth daily.   Yes [provider]  atorvastatin (LIPITOR) 40 MG tablet TAKE 1 TABLET BY MOUTH DAILY 03/03/22  Yes Tobb, Kardie, DO  Biotin w/ Vitamins C & E (HAIR/SKIN/NAILS PO) Take 6,000 mcg by mouth daily. 2 tabs daily   Yes [provider]  carvedilol (COREG) 12.5 MG tablet TAKE 1 TABLET BY MOUTH  TWICE DAILY 12/16/21  Yes Tobb, Kardie, DO  cefdinir (OMNICEF) 300 MG capsule Take 1 capsule (300 mg total) by mouth 2 (two) times daily. 05/20/22  Yes Copland, Gay Filler, MD  Cholecalciferol (VITAMIN D3 PO) Take 125 mg by mouth daily.   Yes [provider]  cloNIDine (CATAPRES) 0.2 MG tablet TAKE 1 TABLET BY MOUTH 3 TIMES  DAILY 04/21/22  Yes Copland, Gay Filler, MD  Cobalamin Combinations (B-12) [phone] MCG SUBL B-12 Plus 5,000 mcg-100 mcg sublingual tablet   Yes [provider]  diclofenac Sodium (VOLTAREN) 1 % GEL Apply 4 g topically 4 (four) times daily. Patient taking differently: Apply 4 g topically as needed. 08/04/21  Yes Saguier, Percell Miller, PA-C  ferrous sulfate 325 (65 FE) MG tablet Take 325 mg by mouth daily with breakfast.   Yes [provider]  gabapentin (NEURONTIN) 400 MG capsule TAKE 2 CAPSULES BY MOUTH 3 TIMES DAILY 05/04/22  Yes Copland, Gay Filler, MD  GEMTESA 75 MG TABS Take 1 tablet by mouth daily. 04/08/22  Yes [provider]  hydrochlorothiazide (HYDRODIURIL) 25 MG tablet TAKE 1 TABLET BY MOUTH DAILY 04/21/22  Yes Copland, Gay Filler, MD  irbesartan (AVAPRO) 300 MG tablet TAKE 1 TABLET BY MOUTH DAILY 02/02/22  Yes Copland, Gay Filler, MD  isosorbide mononitrate (IMDUR) 30 MG 24 hr tablet Take 1 tablet (30 mg total) by mouth daily. 03/06/22  Yes Tobb, Kardie, DO  metFORMIN (GLUCOPHAGE) 1000 MG tablet TAKE 1 TABLET BY MOUTH TWICE  DAILY WITH MEALS 12/16/21  Yes Copland, Gay Filler, MD  methimazole (TAPAZOLE) 5 MG tablet Take 2.5 mg by mouth daily. 10/02/20  Yes [provider]  methocarbamol (ROBAXIN) 500 MG  tablet Take 1 tablet (500 mg total) by mouth every 8 (eight) hours as needed for muscle spasms. 10/01/21  Yes Copland, Gay Filler, MD  Multiple Vitamin (MULTIVITAMIN ADULT PO) Take 1 tablet by mouth daily.   Yes [provider]  OZEMPIC, 0.25 OR 0.5 MG/DOSE, 2 MG/3ML SOPN  10/15/21  Yes [provider]  potassium chloride SA (KLOR-CON M) 20 MEQ tablet TAKE 1 TABLET BY MOUTH DAILY 04/21/22  Yes Copland, Gay Filler, MD  TURMERIC PO Take 500 mg by mouth daily.   Yes [provider]  vitamin C (ASCORBIC ACID) 500 MG tablet Take 500 mg by mouth daily.   Yes [provider]  Accu-Chek Softclix Lancets lancets Use to check blood sugar once day.  Dx code: E11.9 04/28/21  Copland, Gay Filler, MD  furosemide (LASIX) 40 MG tablet Take 1 tablet (40 mg total) by mouth once a week. 03/06/22   Tobb, Kardie, DO  glucose blood (ACCU-CHEK GUIDE) test strip Use to check blood sugar once day.  Dx code: E11.9 04/28/21   Copland, Gay Filler, MD  nystatin cream (MYCOSTATIN) Apply 1 application topically 2 (two) times daily. Patient taking differently: Apply 1 application  topically 2 (two) times daily as needed. 10/07/20   Malachy Mood, MD  UNABLE TO FIND daily. Med Name: Green Lipped Muscle oil GLX3    [provider]    Physical Exam: Vitals:   05/22/22 0900 05/22/22 1051 05/22/22 1444 05/22/22 1544  BP: (!) 169/68  (!) 168/62 (!) 164/69  Pulse: 78  71 72  Resp:   20 18  Temp:  97.9 F (36.6 C) 97.7 F (36.5 C) 98.2 F (36.8 C)  TempSrc:  Oral Oral Oral  SpO2: 97%  100% 99%  Weight:      Height:       General:  Appears calm and comfortable and is in NAD Eyes:  wearing dark sunglasses; tearing from R eye; B mild scleral injection without obvious anomaly ENT:  grossly normal hearing, lips & tongue, mmm Neck:  no LAD, masses or thyromegaly Cardiovascular:  RRR, no m/r/g. No LE edema.  Respiratory:   CTA bilaterally with no wheezes/rales/rhonchi.  Normal respiratory  effort. Abdomen:  soft, NT, ND Skin:  no rash or induration seen on limited exam Musculoskeletal:  grossly normal tone BUE/BLE, good ROM, no bony abnormality Psychiatric:  blunted mood and affect, speech fluent and appropriate, AOx3 Neurologic:  CN 2-12 grossly intact, moves all extremities in coordinated fashion   Radiological Exams on Admission: Independently reviewed - see discussion in A/P where applicable  US Renal  Result Date: 05/21/2022 CLINICAL DATA:  Renal dysfunction EXAM: RENAL / URINARY TRACT ULTRASOUND COMPLETE COMPARISON:  MR abdomen done on 05/11/2022 FINDINGS: Right Kidney: Renal measurements: 11.8 x 5.5 x 5.6 cm = volume: 188 mL. There is no hydronephrosis. There is increased cortical echogenicity. There is 1.5 cm cyst in the upper pole. There are small hyperechoic foci without acoustic shadowing, possibly vascular calcifications. Left Kidney: Renal measurements: 11.2 x 5.1 x 4.5 cm = volume: 134 mL. There is no hydronephrosis. There is increased cortical echogenicity. There is 1.8 cm cyst. There are small hyperechoic foci without acoustic shadowing, possibly partial volume averaging of renal sinus fat. Bladder: Appears normal for degree of bladder distention. Other: None. IMPRESSION: There is no hydronephrosis. There is increased cortical echogenicity suggesting medical renal disease. Bilateral renal cysts. Electronically Signed   By: Elmer Picker M.D.   On: 05/21/2022 19:41    EKG: Independently reviewed.  NSR with rate 74; no evidence of acute ischemia   Labs on Admission: I have personally reviewed the available labs and imaging studies at the time of the admission.  Pertinent labs:    K+ 3.4 Glucose 122 BUN 40/3.24/14 yesterday to 34/1.65/33 today; baseline 1.3/43 Lipids: 128/47/60/100 WBC 5 Hgb 10.3 UA: trace hgb, rare bacteria   Assessment and Plan: Principal Problem:   AKI (acute kidney injury) ([place]) Active Problems:   Diabetes mellitus type 2,  controlled ([place])   Chronic kidney disease, stage 3 ([facility])   Graves disease   Mixed hyperlipidemia   Obesity (BMI 30-39.9)   Coronary artery disease involving native coronary artery of native heart without angina pectoris   Hypertension   Eye pain, right  AKI on Stage 3a (b?) CKD -Patient with baseline compromised renal function, stage 3a CKD approaching stage 3b  -Current creatinine is increased at least 1.5 times compared to baseline and presumed to have occurred within the last 7 days -Likely due to prerenal secondary to severe dehydration in the setting of UTI (improving, negative COVID test on 11/1 and refused retesting) and ongoing nephrotoxic medications -IVF -US-renal shows medical renal disease, as expected -Follow up renal function by BMP -Avoid ACEI and NSAIDs  -She has improved quickly and was offered the option of dc from the ER but the patient declined; will monitor on med surg overnight and anticipate dc to home in AM  Eye pain -Patient developed acute onset of R eye pain overnight with excessive tearing; this is in the setting of recent cataract surgery -Upon further questioning, the patient acknowledges that she has not been using drops for the last few days -Dr. George Ina was contacted but he is out of town; "I highly suspect that she has 'rebound iritis' as she has tapered or stopped her post op drops. Endophthalmitis is very unlikely this far out". -Dr. Katy Fitch was on call and was also called.  He agrees that this is likely related to corneal abrasion, dryness, or post-surgical inflammation - and recommends refresh tears, cover with antibiotic drop (Moxifloxacin TID), prednisolone QID -If still having discomfort tomorrow, Dr. Katy Fitch will come in to see.  Recent URI -COVID/flu negative earlier in the week, refused retest -This appears to be better -She was given cefdinir but does not have obvious evidence of infection so will stop for now  DM -Will check A1c -hold  Glucophage -Cover with moderate-scale SSI  -Continue gabapentin  HTN -Continue amlodipine, Catapres, carvediloll -Hold HCTZ, irbesartan due to AKI -Will cover with IV hydralazine prn  HLD -Continue atorvastatin  Graves disease -Continue methimazole  CAD -No c/o CP -Continue ASA and Imdur  Obesity -Body mass index is 32.65 kg/m..  -Weight loss should be encouraged -Hold Ozempic as inpatient   Advance Care Planning:   Code Status: Full Code   Consults: Ophthalmology by telephone/Secure Chat only  DVT Prophylaxis: Lovenox  Family Communication: None present; she is capable of communicating with her family at this time  Severity of Illness: The appropriate patient status for this patient is OBSERVATION. Observation status is judged to be reasonable and necessary in order to provide the required intensity of service to ensure the patient's safety. The patient's presenting symptoms, physical exam findings, and initial radiographic and laboratory data in the context of their medical condition is felt to place them at decreased risk for further clinical deterioration. Furthermore, it is anticipated that the patient will be medically stable for discharge from the hospital within 2 midnights of admission.   Author: Karmen Bongo, MD 05/22/2022 7:10 PM  For on call review [email or web address].

## 2022-05-22 NOTE — ED Notes (Signed)
Spoke with Judson Roch, RN. Report given to [facility] charge. Dr. Alvino Chapel is the accepting physician.

## 2022-05-22 NOTE — ED Provider Notes (Signed)
  Physical Exam  BP (!) 160/64   Pulse 89   Temp 99.1 F (37.3 C) (Oral)   Resp 16   Ht '5\' 5"'$  (1.651 m)   Wt 89 kg   SpO2 93%   BMI 32.65 kg/m   Physical Exam  Procedures  Procedures  ED Course / MDM   Clinical Course as of 05/22/22 1004  Fri May 22, 2022  0812 Improvement of Cr overnight from 3.4 to 1.6, baseline around 1.3. Inpatient bed pending. [VK]  8372 No inpatient bed is available at this time. Patient will have ED to ED transfer to [facility] and Dr. Alvino Chapel is accepting. [VK]    Clinical Course User Index [VK] Kemper Durie, DO   Medical Decision Making Amount and/or Complexity of Data Reviewed Labs: ordered. Radiology: ordered.  Risk Decision regarding hospitalization.   Patient is inpatient and transferred to the ER since there has been a delay on available beds.  Well-appearing.  Will discuss with hospitalist to let them know that the patient is here.       Davonna Belling, MD 05/22/22 1005

## 2022-05-22 NOTE — ED Notes (Signed)
ED TO INPATIENT HANDOFF REPORT  ED Nurse Name and Phone #: Inez Rosato [place], RN  S Name/Age/Gender Emily Wood 71 y.o. female Room/Bed: MH07/MH07  Code Status   Code Status: Not on file  Home/SNF/Other Home Patient oriented to: self, place, time, and situation Is this baseline? Yes   Triage Complete: Triage complete  Chief Complaint AKI (acute kidney injury) [facility]) [N17.9]  Triage Note Pt states she was seen yesterday at PCP and told today she has abnormal kidney labs. Reports decreased po intake since 10/27 due to feeling like she has a cold. Denies sx today.    Allergies No Known Allergies  Level of Care/Admitting Diagnosis ED Disposition     ED Disposition  Admit   Condition  --   Comment  Hospital Area: [place] [[id number]]  Level of Care: Telemetry Medical [104]  May admit patient to [facility] or [facility] if equivalent level of care is available:: Yes  Interfacility transfer: Yes  Covid Evaluation: Asymptomatic - no recent exposure (last 10 days) testing not required  Diagnosis: AKI (acute kidney injury) [facility]) [[id number]]  Admitting Physician: Kayleen Memos [[id number]]  Attending Physician: Kayleen Memos [[id number]]  Certification:: I certify this patient will need inpatient services for at least 2 midnights  Estimated Length of Stay: 2          B Medical/Surgery History Past Medical History:  Diagnosis Date   Arthritis    Diabetes mellitus without complication ([place])    Family history of adverse reaction to anesthesia    PONV   Graves disease 04/14/2020   Graves disease 07/20/2020   Graves disease    Hypertension    Mixed hyperlipidemia 02/20/2021   Past Surgical History:  Procedure Laterality Date   CATARACT EXTRACTION W/PHACO Right 04/14/2022   Procedure: CATARACT EXTRACTION PHACO AND INTRAOCULAR LENS PLACEMENT ([place]) RIGHT DIABETIC 10.93 00:53.0;  Surgeon: Birder Robson, MD;  Location: [place];  Service:  Ophthalmology;  Laterality: Right;   CATARACT EXTRACTION W/PHACO Left 04/28/2022   Procedure: CATARACT EXTRACTION PHACO AND INTRAOCULAR LENS PLACEMENT ([facility]) LEFT DIABETIC 6.05 00:45.7;  Surgeon: Birder Robson, MD;  Location: [place];  Service: Ophthalmology;  Laterality: Left;  Diabetic   CHOLECYSTECTOMY     HERNIA REPAIR     OTHER SURGICAL HISTORY     scar tissue removal from bowels   PARTIAL HYSTERECTOMY     Still has ovaries   UTERINE FIBROID SURGERY       A IV Location/Drains/Wounds Patient Lines/Drains/Airways Status     Active Line/Drains/Airways     Name Placement date Placement time Site Days   Peripheral IV 05/22/22 22 G Left Forearm 05/22/22  0438  Forearm  less than 1            Intake/Output Last 24 hours  Intake/Output Summary (Last 24 hours) at 05/22/2022 0856 Last data filed at 05/21/2022 1958 Gross per 24 hour  Intake 1000.44 ml  Output --  Net 1000.44 ml    Labs/Imaging Results for orders placed or performed during the hospital encounter of 05/21/22 (from the past 48 hour(s))  Basic metabolic panel     Status: Abnormal   Collection Time: 05/21/22  6:11 PM  Result Value Ref Range   Sodium 135 135 - 145 mmol/L   Potassium 4.2 3.5 - 5.1 mmol/L   Chloride 98 98 - 111 mmol/L   CO2 28 22 - 32 mmol/L   Glucose, Bld 105 (H) 70 - 99 mg/dL  Comment: Glucose reference range applies only to samples taken after fasting for at least 8 hours.   BUN 48 (H) 8 - 23 mg/dL   Creatinine, Ser 3.42 (H) 0.44 - 1.00 mg/dL   Calcium 9.1 8.9 - 10.3 mg/dL   GFR, Estimated 14 (L) >60 mL/min    Comment: (NOTE) Calculated using the CKD-EPI Creatinine Equation (2021)    Anion gap 9 5 - 15    Comment: Performed at [facility], [address] [place], [place] [address]  Hepatic function panel     Status: Abnormal   Collection Time: 05/21/22  6:11 PM  Result Value Ref Range   Total Protein 8.3 (H) 6.5 - 8.1 g/dL   Albumin 3.9 3.5 - 5.0 g/dL    AST 28 15 - 41 U/L   ALT 15 0 - 44 U/L   Alkaline Phosphatase 65 38 - 126 U/L   Total Bilirubin 1.0 0.3 - 1.2 mg/dL   Bilirubin, Direct 0.2 0.0 - 0.2 mg/dL   Indirect Bilirubin 0.8 0.3 - 0.9 mg/dL    Comment: Performed at [facility], [address] [place], [place] [address]  Lipase, blood     Status: None   Collection Time: 05/21/22  6:11 PM  Result Value Ref Range   Lipase 47 11 - 51 U/L    Comment: Performed at [facility], [address] [place], [place] [address]  CBC with Differential     Status: Abnormal   Collection Time: 05/21/22  6:11 PM  Result Value Ref Range   WBC 5.0 4.0 - 10.5 K/uL   RBC 3.58 (L) 3.87 - 5.11 MIL/uL   Hemoglobin 10.3 (L) 12.0 - 15.0 g/dL   HCT 32.0 (L) 36.0 - 46.0 %   MCV 89.4 80.0 - 100.0 fL   MCH 28.8 26.0 - 34.0 pg   MCHC 32.2 30.0 - 36.0 g/dL   RDW 13.9 11.5 - 15.5 %   Platelets 169 150 - 400 K/uL    Comment: Immature Platelet Fraction may be clinically indicated, consider ordering this additional test [id number]    nRBC 0.0 0.0 - 0.2 %   Neutrophils Relative % 53 %   Neutro Abs 2.6 1.7 - 7.7 K/uL   Lymphocytes Relative 30 %   Lymphs Abs 1.5 0.7 - 4.0 K/uL   Monocytes Relative 15 %   Monocytes Absolute 0.7 0.1 - 1.0 K/uL   Eosinophils Relative 2 %   Eosinophils Absolute 0.1 0.0 - 0.5 K/uL   Basophils Relative 0 %   Basophils Absolute 0.0 0.0 - 0.1 K/uL   Immature Granulocytes 0 %   Abs Immature Granulocytes 0.01 0.00 - 0.07 K/uL    Comment: Performed at [facility], [address] [place], [place] [address]  Urinalysis, Routine w reflex microscopic Urine, Clean Catch     Status: Abnormal   Collection Time: 05/21/22  7:44 PM  Result Value Ref Range   Color, Urine YELLOW YELLOW   APPearance CLEAR CLEAR   Specific Gravity, Urine 1.010 1.005 - 1.030   pH 6.0 5.0 - 8.0   Glucose, UA NEGATIVE NEGATIVE mg/dL   Hgb urine dipstick TRACE (A) NEGATIVE   Bilirubin Urine NEGATIVE NEGATIVE   Ketones, ur  NEGATIVE NEGATIVE mg/dL   Protein, ur NEGATIVE NEGATIVE mg/dL   Nitrite NEGATIVE NEGATIVE   Leukocytes,Ua NEGATIVE NEGATIVE    Comment: Performed at [facility], [address] [place], [place] [address]  Urinalysis, Microscopic (reflex)     Status: Abnormal   Collection Time: 05/21/22  7:44 PM  Result Value Ref Range   RBC / HPF 0-5 0 - 5 RBC/hpf   WBC, UA NONE SEEN 0 - 5 WBC/hpf   Bacteria, UA RARE (A) NONE SEEN   Squamous Epithelial / LPF 0-5 0 - 5    Comment: Performed at [facility], [address] [place], [place] [address]  Basic metabolic panel     Status: Abnormal   Collection Time: 05/22/22  7:00 AM  Result Value Ref Range   Sodium 140 135 - 145 mmol/L   Potassium 3.4 (L) 3.5 - 5.1 mmol/L    Comment: REPEATED TO VERIFY DELTA CHECK NOTED    Chloride 105 98 - 111 mmol/L   CO2 26 22 - 32 mmol/L   Glucose, Bld 122 (H) 70 - 99 mg/dL    Comment: Glucose reference range applies only to samples taken after fasting for at least 8 hours.   BUN 34 (H) 8 - 23 mg/dL   Creatinine, Ser 1.65 (H) 0.44 - 1.00 mg/dL    Comment: REPEATED TO VERIFY DELTA CHECK NOTED RESULT CALLED TO, READ BACK BY AND VERIFIED WITH C. Heriberto Antigua 2229 05/22/2022 T. TYSOR    Calcium 8.9 8.9 - 10.3 mg/dL   GFR, Estimated 33 (L) >60 mL/min    Comment: (NOTE) Calculated using the CKD-EPI Creatinine Equation (2021)    Anion gap 9 5 - 15    Comment: Performed at [facility], [address] [place], [place] [address]   US Renal  Result Date: 05/21/2022 CLINICAL DATA:  Renal dysfunction EXAM: RENAL / URINARY TRACT ULTRASOUND COMPLETE COMPARISON:  MR abdomen done on 05/11/2022 FINDINGS: Right Kidney: Renal measurements: 11.8 x 5.5 x 5.6 cm = volume: 188 mL. There is no hydronephrosis. There is increased cortical echogenicity. There is 1.5 cm cyst in the upper pole. There are small hyperechoic foci without acoustic shadowing, possibly vascular calcifications. Left Kidney:  Renal measurements: 11.2 x 5.1 x 4.5 cm = volume: 134 mL. There is no hydronephrosis. There is increased cortical echogenicity. There is 1.8 cm cyst. There are small hyperechoic foci without acoustic shadowing, possibly partial volume averaging of renal sinus fat. Bladder: Appears normal for degree of bladder distention. Other: None. IMPRESSION: There is no hydronephrosis. There is increased cortical echogenicity suggesting medical renal disease. Bilateral renal cysts. Electronically Signed   By: Elmer Picker M.D.   On: 05/21/2022 19:41   DG Chest 2 View  Result Date: 05/20/2022 CLINICAL DATA:  Fatigue and malaise. EXAM: CHEST - 2 VIEW COMPARISON:  Chest x-ray dated February 13, 2021. FINDINGS: The heart size and mediastinal contours are within normal limits. Both lungs are clear. The visualized skeletal structures are unremarkable. IMPRESSION: No active cardiopulmonary disease. Electronically Signed   By: Titus Dubin M.D.   On: 05/20/2022 15:47    Pending Labs Unresulted Labs (From admission, onward)    None       Vitals/Pain Today's Vitals   05/22/22 0550 05/22/22 0600 05/22/22 0700 05/22/22 0758  BP: (!) 206/73 (!) 185/79 (!) 160/64   Pulse: 70 69 89   Resp: '19 19 16   '$ Temp:   99.1 F (37.3 C)   TempSrc:   Oral   SpO2: 97% 95% 93%   Weight:      Height:      PainSc:    0-No pain  Isolation Precautions No active isolations  Medications Medications  0.9 %  sodium chloride infusion ( Intravenous New Bag/Given 05/22/22 0442)  isosorbide mononitrate (IMDUR) 24 hr tablet 30 mg (30 mg Oral Not Given 05/22/22 0500)  atorvastatin (LIPITOR) tablet 40 mg (has no administration in time range)  metFORMIN (GLUCOPHAGE) tablet 750 mg (750 mg Oral Not Given 05/22/22 0614)  sodium chloride 0.9 % bolus 1,000 mL (0 mLs Intravenous Stopped 05/21/22 1958)  amLODipine (NORVASC) tablet 5 mg (5 mg Oral Given 05/22/22 0450)  ferrous sulfate tablet 325 mg (325 mg Oral Given 05/22/22 0450)   cloNIDine (CATAPRES) tablet 0.1 mg (0.1 mg Oral Given 05/22/22 0554)    Mobility walks with person assist Low fall risk   Focused Assessments Cardiac Assessment Handoff:  Cardiac Rhythm: Normal sinus rhythm Lab Results  Component Value Date   TROPONINI <0.01 08/26/2015   No results found for: "DDIMER" Does the Patient currently have chest pain? No   , Renal Assessment Handoff:  Hemodialysis Schedule: NA Last Hemodialysis date and time:    Restricted appendage:    R Recommendations: See Admitting Provider Note  Report given to:   Additional Notes: AKI, Weakness, decreased intake

## 2022-05-22 NOTE — TOC Initial Note (Addendum)
Transition of Care [facility]) - Initial/Assessment Note    Patient Details  Name: Emily Wood MRN: [id number] Date of Birth: [birth year]  Transition of Care [facility]) CM/SW Contact:    Curlene Labrum, RN Phone Number: 05/22/2022, 4:05 PM  Clinical Narrative:                 Cm met with the patient at the bedside - Adm for Acute kidney injury  - receiving IVF at this time.  The patient lives at home with her husband and plans to return home once medically stable for discharge.  Patient was given Medicare observation letter..  The patient states that her husband will provide transportation to home when she is ready for discharge - no date at this time.  The patient states that she does not have difficulty with transportation.  05/22/2022 1627 - CM spoke with [facility], CM with UR and Code 44 completed and applied to claim.  Patient is aware that she is under Medicare Observation and was given documentation.  Expected Discharge Plan: Home/Self Care Barriers to Discharge: Continued Medical Work up   Patient Goals and CMS Choice Patient states their goals for this hospitalization and ongoing recovery are:: To return home. CMS Medicare.gov Compare Post Acute Care list provided to:: Patient    Expected Discharge Plan and Services Expected Discharge Plan: Home/Self Care   Discharge Planning Services: CM Consult   Living arrangements for the past 2 months: Single Family Home                                      Prior Living Arrangements/Services Living arrangements for the past 2 months: Single Family Home Lives with:: Spouse Patient language and need for interpreter reviewed:: Yes Do you feel safe going back to the place where you live?: Yes      Need for Family Participation in Patient Care: Yes (Comment) Care giver support system in place?: Yes (comment) Current home services: DME ([facility].) Criminal Activity/Legal Involvement Pertinent to Current  Situation/Hospitalization: No - Comment as needed  Activities of Daily Living      Permission Sought/Granted Permission sought to share information with : Case Manager Permission granted to share information with : Yes, Verbal Permission Granted        Permission granted to share info w Relationship: Spouse - Emily Wood - [phone]     Emotional Assessment Appearance:: Appears stated age Attitude/Demeanor/Rapport: Gracious Affect (typically observed): Accepting Orientation: : Oriented to Self, Oriented to Place, Oriented to  Time, Oriented to Situation Alcohol / Substance Use: Not Applicable Psych Involvement: No (comment)  Admission diagnosis:  AKI (acute kidney injury) ([place]) [N17.9] Patient Active Problem List   Diagnosis Date Noted   AKI (acute kidney injury) ([place]) 05/21/2022   Coronary artery disease involving native heart 03/06/2022   Tricuspid valve insufficiency 11/05/2021   Hypertension 11/05/2021   Snoring 11/05/2021   Daytime somnolence 11/05/2021   Type 2 diabetes mellitus with hyperosmolarity without coma, without long-term current use of insulin ([place]) 11/05/2021   Coronary artery disease involving native coronary artery of native heart without angina pectoris 05/15/2021   Nonrheumatic tricuspid valve regurgitation 05/15/2021   Medication management 05/15/2021   Pulmonary hypertension, unspecified ([place]) 05/15/2021   Precordial pain 02/20/2021   Nonspecific abnormal electrocardiogram (ECG) (EKG) 02/20/2021   Mixed hyperlipidemia 02/20/2021   Obesity (BMI 30-39.9) 02/20/2021   Arthritis 02/18/2021  Diabetic retinopathy ([place]) 08/28/2020   Mixed incontinence 05/31/2020   Urinary urgency 05/31/2020   Graves disease 04/14/2020   Right renal mass 11/22/2019   Arthritis of left knee 09/25/2019   Lymphedema 01/25/2017   Fibroid uterus 01/14/2017   Hypertensive heart disease without heart failure 10/01/2016   Chronic kidney disease, stage 3 ([place])  09/21/2016   Meralgia paresthetica of left side 08/27/2015   Diabetes mellitus type 2, controlled ([place]) 08/02/2015   Chronic venous insufficiency 06/28/2015   Essential hypertension 02/15/2015   Peripheral edema 02/15/2015   Chronic knee pain 02/15/2015   PCP:  Darreld Mclean, MD Pharmacy:   [id number] [place] [address]Phone: [phone]: [phone] [facility] [address]Phone: [phone]: [address]Phone: [phone]: [phone]    Social Determinants of Health ([employer]ed Bank Interventions    Readmission Risk Interventions    05/22/2022    4:04 PM  Readmission Risk Prevention Plan  Post Dischage Appt Complete  Medication Screening Complete  Transportation Screening Complete

## 2022-05-22 NOTE — ED Provider Notes (Signed)
Patient signed out to me by Dr. Nicholes Stairs pending inpatient bed.  In short this is a 71 year old female with a past medical history of Graves' disease, diabetes, hypertension and CKD stage III that presented to the emergency department with abnormal labs performed by her PCP.  She was found to have an AKI with a creatinine of 3.4 from baseline of 1.3.  Upon evaluation this morning, the patient is awake and alert resting in bed comfortably in no acute distress.  She has been restarted on her antihypertensive and her blood pressures are improving.  She is in no acute distress.  Repeat BMP will be performed.  We will reach out to ED at [facility] or [place] [facility] for possible ED to ED transfer she has been pending inpatient placement for nearly 12 hours.   Kemper Durie, DO 05/22/22 [phone]

## 2022-05-23 DIAGNOSIS — N1831 Chronic kidney disease, stage 3a: Secondary | ICD-10-CM | POA: Diagnosis not present

## 2022-05-23 DIAGNOSIS — E0500 Thyrotoxicosis with diffuse goiter without thyrotoxic crisis or storm: Secondary | ICD-10-CM

## 2022-05-23 DIAGNOSIS — N179 Acute kidney failure, unspecified: Secondary | ICD-10-CM | POA: Diagnosis not present

## 2022-05-23 DIAGNOSIS — I10 Essential (primary) hypertension: Secondary | ICD-10-CM

## 2022-05-23 DIAGNOSIS — E119 Type 2 diabetes mellitus without complications: Secondary | ICD-10-CM | POA: Diagnosis not present

## 2022-05-23 DIAGNOSIS — E876 Hypokalemia: Secondary | ICD-10-CM | POA: Insufficient documentation

## 2022-05-23 DIAGNOSIS — I2510 Atherosclerotic heart disease of native coronary artery without angina pectoris: Secondary | ICD-10-CM | POA: Diagnosis not present

## 2022-05-23 DIAGNOSIS — D649 Anemia, unspecified: Secondary | ICD-10-CM | POA: Insufficient documentation

## 2022-05-23 LAB — BASIC METABOLIC PANEL
Anion gap: 5 (ref 5–15)
BUN: 26 mg/dL — ABNORMAL HIGH (ref 8–23)
CO2: 28 mmol/L (ref 22–32)
Calcium: 9.1 mg/dL (ref 8.9–10.3)
Chloride: 106 mmol/L (ref 98–111)
Creatinine, Ser: 1.38 mg/dL — ABNORMAL HIGH (ref 0.44–1.00)
GFR, Estimated: 41 mL/min — ABNORMAL LOW (ref >60)
Glucose, Bld: 97 mg/dL (ref 70–99)
Potassium: 3.6 mmol/L (ref 3.5–5.1)
Sodium: 139 mmol/L (ref 135–145)

## 2022-05-23 LAB — CBC
HCT: 30.9 % — ABNORMAL LOW (ref 36.0–46.0)
Hemoglobin: 10.1 g/dL — ABNORMAL LOW (ref 12.0–15.0)
MCH: 28.9 pg (ref 26.0–34.0)
MCHC: 32.7 g/dL (ref 30.0–36.0)
MCV: 88.5 fL (ref 80.0–100.0)
Platelets: 238 10*3/uL (ref 150–400)
RBC: 3.49 MIL/uL — ABNORMAL LOW (ref 3.87–5.11)
RDW: 13.8 % (ref 11.5–15.5)
WBC: 4.5 10*3/uL (ref 4.0–10.5)
nRBC: 0 % (ref 0.0–0.2)

## 2022-05-23 LAB — GLUCOSE, CAPILLARY
Glucose-Capillary: 98 mg/dL (ref 70–99)
Glucose-Capillary: 143 mg/dL — ABNORMAL HIGH (ref 70–99)

## 2022-05-23 LAB — HIV ANTIBODY (ROUTINE TESTING W REFLEX): HIV Screen 4th Generation wRfx: NONREACTIVE

## 2022-05-23 MED ORDER — POLYVINYL ALCOHOL 1.4 % OP SOLN: 1.0000 [drp] | OPHTHALMIC | 0 refills | Status: DC | PRN

## 2022-05-23 MED ORDER — PREDNISOLONE ACETATE 1 % OP SUSP: 1.0000 [drp] | Freq: Four times a day (QID) | OPHTHALMIC | 0 refills | Status: AC

## 2022-05-23 MED ORDER — VITAMIN D3 125 MCG (5000 UT) PO CAPS: 1.0000 | ORAL_CAPSULE | Freq: Every day | ORAL | Status: AC

## 2022-05-23 MED ORDER — GATIFLOXACIN 0.5 % OP SOLN: 1.0000 [drp] | Freq: Four times a day (QID) | OPHTHALMIC | Status: DC

## 2022-05-23 NOTE — Progress Notes (Signed)
Pt refusing lovenox shot, educated, provider made aware

## 2022-05-23 NOTE — Discharge Summary (Signed)
Physician Discharge Summary   Patient: Emily Wood MRN: [id number] DOB: 1950/07/30  Admit date:     05/21/2022  Discharge date: 05/23/22  Discharge Physician: Edwin Dada   PCP: Darreld Mclean, MD     Recommendations at discharge:  Follow up with PCP Dr. Lorelei Pont in 1 week Follow up with Ophthalmology Dr. George Ina as needed Dr. Lorelei Pont: Please obtain BMP and resume Avapro as needed      Discharge Diagnoses: Principal Problem:   AKI (acute kidney injury) ([place]) Active Problems:   Diabetes mellitus type 2, controlled ([place])   Chronic kidney disease, stage 3 ([place])   Graves disease   Mixed hyperlipidemia   Obesity (BMI 30-39.9)   Coronary artery disease involving native coronary artery of native heart without angina pectoris   Hypertension   Eye pain, right   Hypokalemia   Normocytic anemia      Hospital Course: Emily Wood is a 71 y.o. F with DM, HTN, HLD, obesity, Graves' disease, recent cataract surgery who presented with malaise and bronchitis, found to have acute kidney injury.   Acute kidney injury on stage IIIa chronic kidney disease Patient was treated with IV fluids, creatinine improved to baseline.    She was discharged off of her ARB and recommended to follow up with PCP prior to resumption.    Cataract surgery Patient had eye pain, tearing at admission, had not been using her eye drops post-cataract surgery.  These (gatifloxacin, steroid drops and moisturizing drops) were restarted, and in the morning, her eye felt better, had no redness, pain, tearing.   URI The symptoms appear to have resolved.  Stop cefdinir.    Diabetes Hypertension Hyperlipidemia Graves' disease Coronary disease Obesity Stable chronic conditions, medicaitons continued except Avapro as above.          [facility], [facility] Controlled Substances Registry was reviewed for this patient prior to discharge.  Consultants: Ophthalmology by  phone Procedures performed: None Disposition: Home Diet recommendation:  Carb modified diet  DISCHARGE MEDICATION: Allergies as of 05/23/2022   No Known Allergies      Medication List     STOP taking these medications    cefdinir 300 MG capsule Commonly known as: OMNICEF   furosemide 40 MG tablet Commonly known as: LASIX   irbesartan 300 MG tablet Commonly known as: AVAPRO       TAKE these medications    Accu-Chek Guide test strip Generic drug: glucose blood Use to check blood sugar once day.  Dx code: E11.9   Accu-Chek Softclix Lancets lancets Use to check blood sugar once day.  Dx code: E11.9   Alpha-Lipoic Acid 300 MG Caps Take 2 capsules by mouth daily.   amLODipine 5 MG tablet Commonly known as: NORVASC TAKE 1 AND 1/2 TABLETS BY MOUTH  DAILY   ascorbic acid 500 MG tablet Commonly known as: VITAMIN C Take 1,000 mg by mouth daily. gummy   aspirin 81 MG tablet Take 81 mg by mouth daily.   atorvastatin 40 MG tablet Commonly known as: LIPITOR TAKE 1 TABLET BY MOUTH DAILY   B-12 [phone] MCG Subl B-12 Plus 5,000 mcg-100 mcg sublingual tablet   carvedilol 12.5 MG tablet Commonly known as: COREG TAKE 1 TABLET BY MOUTH  TWICE DAILY What changed: when to take this   cloNIDine 0.2 MG tablet Commonly known as: CATAPRES TAKE 1 TABLET BY MOUTH 3 TIMES  DAILY What changed: when to take this   diclofenac Sodium 1 % Gel Commonly known as:  Voltaren Apply 4 g topically 4 (four) times daily. What changed:  when to take this reasons to take this   ferrous sulfate 325 (65 FE) MG tablet Take 325 mg by mouth daily with breakfast.   gabapentin 400 MG capsule Commonly known as: NEURONTIN TAKE 2 CAPSULES BY MOUTH 3 TIMES DAILY What changed: See the new instructions.   gatifloxacin 0.5 % Soln Commonly known as: ZYMAXID Place 1 drop into both eyes 4 (four) times daily.   Gemtesa 75 MG Tabs Generic drug: Vibegron Take 75 mg by mouth daily.    HAIR/SKIN/NAILS PO Take 6,000 mcg by mouth daily. 2 tabs daily   hydrochlorothiazide 25 MG tablet Commonly known as: HYDRODIURIL TAKE 1 TABLET BY MOUTH DAILY   isosorbide mononitrate 30 MG 24 hr tablet Commonly known as: IMDUR Take 1 tablet (30 mg total) by mouth daily.   metFORMIN 1000 MG tablet Commonly known as: GLUCOPHAGE TAKE 1 TABLET BY MOUTH TWICE  DAILY WITH MEALS   methimazole 5 MG tablet Commonly known as: TAPAZOLE Take 2.5 mg by mouth daily.   methocarbamol 500 MG tablet Commonly known as: Robaxin Take 1 tablet (500 mg total) by mouth every 8 (eight) hours as needed for muscle spasms.   MULTIVITAMIN ADULT PO Take 1 tablet by mouth daily. 2 gummy daily centrum over 50   nystatin cream Commonly known as: MYCOSTATIN Apply 1 application topically 2 (two) times daily. What changed:  when to take this reasons to take this   Ozempic (0.25 or 0.5 MG/DOSE) 2 MG/3ML Sopn Generic drug: Semaglutide(0.25 or 0.'5MG'$ /DOS) Inject 0.25 mg as directed once a week.   polyvinyl alcohol 1.4 % ophthalmic solution Commonly known as: LIQUIFILM TEARS Place 1 drop into both eyes as needed for dry eyes.   potassium chloride SA 20 MEQ tablet Commonly known as: KLOR-CON M TAKE 1 TABLET BY MOUTH DAILY   prednisoLONE acetate 1 % ophthalmic suspension Commonly known as: PRED FORTE Place 1 drop into both eyes 4 (four) times daily.   TURMERIC PO Take 500 mg by mouth daily.   Vitamin D3 125 MCG (5000 UT) Caps Take 1 capsule (5,000 Units total) by mouth daily. What changed:  medication strength how much to take        Follow-up Information     Copland, Gay Filler, MD. Schedule an appointment as soon as possible for a visit.   Specialty: Family Medicine Contact information: [address] [place] [address] [phone]         Birder Robson, MD Follow up.   Specialty: Ophthalmology Contact information: [place]  [id number] [phone]                 Discharge Instructions     Discharge instructions   Complete by: As directed    **IMPORTANT DISCHARGE INSTRUCTIONS**   From Dr. Loleta Books: You were admitted for kidney injury This is common when someone gets dehydrated  Here, it corrected easily with fluids, as is typical  For now, do NOT take your Irbesartan/Avapro Dr. Lorelei Pont should see you in 1 week Have your labs checked later this week  Dr. Lorelei Pont likely will restart your Avapro at that time.    Take your eye drops as directed by Dr. George Ina and follow up with him as directed   Push fluids and make sure you are drinking enough water this week  Know that Ozempic can cause nausea and vomiting, so if these symptoms return when you  resume Ozempic, know that you may have to stop that medicine   Increase activity slowly   Complete by: As directed        Discharge Exam: Filed Weights   05/21/22 1700  Weight: 89 kg    General: Pt is alert, awake, not in acute distress Cardiovascular: RRR, nl S1-S2, no murmurs appreciated.   No LE edema.   Respiratory: Normal respiratory rate and rhythm.  CTAB without rales or wheezes. Abdominal: Abdomen soft and non-tender.  No distension or HSM.   Neuro/Psych: Strength symmetric in upper and lower extremities.  Judgment and insight appear normal.   Condition at discharge: good  The results of significant diagnostics from this hospitalization (including imaging, microbiology, ancillary and laboratory) are listed below for reference.   Imaging Studies: US Renal  Result Date: 05/21/2022 CLINICAL DATA:  Renal dysfunction EXAM: RENAL / URINARY TRACT ULTRASOUND COMPLETE COMPARISON:  MR abdomen done on 05/11/2022 FINDINGS: Right Kidney: Renal measurements: 11.8 x 5.5 x 5.6 cm = volume: 188 mL. There is no hydronephrosis. There is increased cortical echogenicity. There is 1.5 cm cyst in the upper pole. There are small hyperechoic foci without  acoustic shadowing, possibly vascular calcifications. Left Kidney: Renal measurements: 11.2 x 5.1 x 4.5 cm = volume: 134 mL. There is no hydronephrosis. There is increased cortical echogenicity. There is 1.8 cm cyst. There are small hyperechoic foci without acoustic shadowing, possibly partial volume averaging of renal sinus fat. Bladder: Appears normal for degree of bladder distention. Other: None. IMPRESSION: There is no hydronephrosis. There is increased cortical echogenicity suggesting medical renal disease. Bilateral renal cysts. Electronically Signed   By: Elmer Picker M.D.   On: 05/21/2022 19:41   DG Chest 2 View  Result Date: 05/20/2022 CLINICAL DATA:  Fatigue and malaise. EXAM: CHEST - 2 VIEW COMPARISON:  Chest x-ray dated February 13, 2021. FINDINGS: The heart size and mediastinal contours are within normal limits. Both lungs are clear. The visualized skeletal structures are unremarkable. IMPRESSION: No active cardiopulmonary disease. Electronically Signed   By: Titus Dubin M.D.   On: 05/20/2022 15:47   MR ABDOMEN WWO CONTRAST  Result Date: 05/11/2022 CLINICAL DATA:  Follow-up of right renal cyst EXAM: MRI ABDOMEN WITHOUT AND WITH CONTRAST TECHNIQUE: Multiplanar multisequence MR imaging of the abdomen was performed both before and after the administration of intravenous contrast. CONTRAST:  10 mL Vueway COMPARISON:  MR abdomen dated 06/29/2021 and multiple priors dating back to 11/18/2019 FINDINGS: Lower chest: No acute findings. Hepatobiliary: No suspicious mass. Subcentimeter T2 hyperintense cyst in segment 7 (18:36). No bile duct dilation. Cholecystectomy. Pancreas: No mass, inflammatory changes, or other parenchymal abnormality identified. Spleen: Within normal limits in size. Subcentimeter mildly T2 hyperintense focus at the superior spleen (6:8) demonstrates peripheral enhancement with subtle, gradual central delayed enhancement. This lesion is likely similar to 06/29/2021 when it was  very subtly present, but new from 05/29/2020. Adrenals/Urinary Tract: No adrenal nodules. Continued slight decrease in size of intrinsically T1 hyperintense cyst in the upper pole right kidney, 1.5 x 1.2 cm (10:50), previously 1.5 x 1.4 cm. Interval slight increase in size of a right upper pole simple cyst measuring 12 mm (7:15), previously 5 mm. A new posterior upper pole 8 mm T2 hypointense cyst demonstrates subtle intrinsic T1 hyperintensity (10:40), likely hemorrhagic/proteinaceous cyst. Multiple additional bilateral subcentimeter T2 hyperintense simple/minimally complicated cysts. Stomach/Bowel: Visualized portions within the abdomen are unremarkable. Vascular/Lymphatic: No pathologically enlarged lymph nodes identified. No abdominal aortic aneurysm demonstrated. Other:  None. Musculoskeletal: No  suspicious bone lesions identified. IMPRESSION: 1. Continued slight decrease in size of hemorrhagic/proteinaceous cyst in the upper pole right kidney. No follow-up imaging recommended. 2. A new 8 mm posterior upper pole cyst demonstrates subtle intrinsic T1 hyperintensity, likely hemorrhagic/proteinaceous cyst. No follow-up imaging recommended. 3. Subcentimeter focus at the superior spleen is likely similar to 06/29/2021 when it was very subtly present, but new from 05/29/2020. This lesion is favored to represent a benign lesion such as a hemangioma. Follow-up MRI in 6 can be considered to ensure greater than 1 year stability. Electronically Signed   By: Darrin Nipper M.D.   On: 05/11/2022 14:56    Microbiology: Results for orders placed or performed during the hospital encounter of 05/21/22  SARS Coronavirus 2 by RT PCR (hospital order, performed in [facility] hospital lab) *cepheid single result test* Anterior Nasal Swab     Status: None   Collection Time: 05/22/22  6:50 PM   Specimen: Anterior Nasal Swab  Result Value Ref Range Status   SARS Coronavirus 2 by RT PCR NEGATIVE NEGATIVE Final    Comment: Performed  at [facility], [place] [address] [place], [place] [address]    Labs: CBC: Recent Labs  Lab 05/20/22 1504 05/21/22 1811 05/23/22 0420  WBC 5.0 5.0 4.5  NEUTROABS  --  2.6  --   HGB 11.2* 10.3* 10.1*  HCT 34.1* 32.0* 30.9*  MCV 88.5 89.4 88.5  PLT 307.0 169 025   Basic Metabolic Panel: Recent Labs  Lab 05/20/22 1504 05/21/22 1811 05/22/22 0700 05/23/22 0420  NA 142 135 140 139  K 4.2 4.2 3.4* 3.6  CL 100 98 105 106  CO2 '31 28 26 28  '$ GLUCOSE 132* 105* 122* 97  BUN 40* 48* 34* 26*  CREATININE 3.24* 3.42* 1.65* 1.38*  CALCIUM 10.3 9.1 8.9 9.1   Liver Function Tests: Recent Labs  Lab 05/21/22 1811  AST 28  ALT 15  ALKPHOS 65  BILITOT 1.0  PROT 8.3*  ALBUMIN 3.9   CBG: Recent Labs  Lab 05/22/22 1235 05/22/22 1613 05/22/22 2130 05/23/22 0754 05/23/22 1203  GLUCAP 94 109* 109* 98 143*    Discharge time spent: approximately 35 minutes spent on discharge counseling, evaluation of patient on day of discharge, and coordination of discharge planning with nursing, social work, pharmacy and case management  Signed: Edwin Dada, MD [facility] 05/23/2022

## 2022-05-26 ENCOUNTER — Telehealth: Payer: Self-pay

## 2022-05-26 NOTE — Telephone Encounter (Signed)
Transition Care Management Unsuccessful Follow-up Telephone Call  Date of discharge and from where:  05/23/2022, [facility]  Attempts:  1st Attempt  Reason for unsuccessful TCM follow-up call:  Unable to reach patient

## 2022-05-28 NOTE — Telephone Encounter (Signed)
Transition Care Management Unsuccessful Follow-up Telephone Call  Date of discharge and from where:  05/23/2022, [facility]  Attempts:  2nd Attempt  Reason for unsuccessful TCM follow-up call:  Unable to reach patient   Has HFU with PCP on 06/01/22.

## 2022-05-28 NOTE — Progress Notes (Addendum)
[facility] [address], [place],  [address] [phone]  Date:  06/01/2022   Name:  Emily Wood   DOB:  September 21, 1950   MRN:  [id number]  PCP:  Darreld Mclean, MD    Chief Complaint: Hospitalization Follow-up (AKI/Concerns/ questions: 1. pt would like to know what happened to her kidneys. 2. Address cough./Flu shot today: if she is able to with the continuing cough/)   History of Present Illness:  Emily Wood is a 71 y.o. very pleasant female patient who presents with the following:  Pt seen today for a recheck visit - I saw her on 11/1 when she was not feeling well, noted to have AKI and she was admitted to the hospital - history of diabetes, chronic kidney disease, hypertension, venous insufficiency and lymphedema, obesity, Graves' hyperthyroidism in remission   Her creat improved from 3.42 to 1.38 upon discharge  Admitted 11/2- 11/4-  Discharge Diagnoses: Principal Problem:   AKI (acute kidney injury) ([place]) Active Problems:   Diabetes mellitus type 2, controlled ([place])   Chronic kidney disease, stage 3 ([place])   Graves disease   Mixed hyperlipidemia   Obesity (BMI 30-39.9)   Coronary artery disease involving native coronary artery of native heart without angina pectoris   Hypertension   Eye pain, right   Hypokalemia   Normocytic anemia Hospital Course: Emily Wood is a 71 y.o. F with DM, HTN, HLD, obesity, Graves' disease, recent cataract surgery who presented with malaise and bronchitis, found to have acute kidney injury. Acute kidney injury on stage IIIa chronic kidney disease Patient was treated with IV fluids, creatinine improved to baseline.   She was discharged off of her ARB and recommended to follow up with PCP prior to resumption. Cataract surgery Patient had eye pain, tearing at admission, had not been using her eye drops post-cataract surgery.  These (gatifloxacin,  steroid drops and moisturizing drops) were restarted, and in the morning, her eye felt better, had no redness, pain, tearing. URI The symptoms appear to have resolved.  Stop cefdinir.  She is finishing out her cefdiinir- she did not take while she was inpt  She has one day left She is coughing up just minimal clear mucus at this time In general she is feeling improved but still a bit tired and she may feel lightheaded when she stands up quickly   Pt reports she is still taking irbersartan 300- she was not aware she was supposed to stop using this    Lab Results  Component Value Date   HGBA1C 6.0 (H) 05/22/2022    Patient Active Problem List   Diagnosis Date Noted   Hypokalemia 05/23/2022   Normocytic anemia 05/23/2022   Eye pain, right 05/22/2022   AKI (acute kidney injury) ([place]) 05/21/2022   Coronary artery disease involving native heart 03/06/2022   Tricuspid valve insufficiency 11/05/2021   Hypertension 11/05/2021   Snoring 11/05/2021   Daytime somnolence 11/05/2021   Type 2 diabetes mellitus with hyperosmolarity without coma, without long-term current use of insulin ([place]) 11/05/2021   Coronary artery disease involving native coronary artery of native heart without angina pectoris 05/15/2021   Nonrheumatic tricuspid valve regurgitation 05/15/2021   Medication management 05/15/2021   Pulmonary hypertension, unspecified ([place]) 05/15/2021   Precordial pain 02/20/2021   Nonspecific abnormal electrocardiogram (ECG) (EKG) 02/20/2021   Mixed hyperlipidemia 02/20/2021   Obesity (BMI 30-39.9) 02/20/2021   Arthritis 02/18/2021   Diabetic  retinopathy ([place]) 08/28/2020   Mixed incontinence 05/31/2020   Urinary urgency 05/31/2020   Graves disease 04/14/2020   Right renal mass 11/22/2019   Arthritis of left knee 09/25/2019   Lymphedema 01/25/2017   Fibroid uterus 01/14/2017   Hypertensive heart disease without heart failure 10/01/2016   Chronic kidney disease, stage 3 ([place])  09/21/2016   Meralgia paresthetica of left side 08/27/2015   Diabetes mellitus type 2, controlled ([place]) 08/02/2015   Chronic venous insufficiency 06/28/2015   Essential hypertension 02/15/2015   Peripheral edema 02/15/2015   Chronic knee pain 02/15/2015    Past Medical History:  Diagnosis Date   Arthritis    Diabetes mellitus without complication ([place])    Family history of adverse reaction to anesthesia    PONV   Graves disease 04/14/2020   Hypertension    Mixed hyperlipidemia 02/20/2021    Past Surgical History:  Procedure Laterality Date   CATARACT EXTRACTION W/PHACO Right 04/14/2022   Procedure: CATARACT EXTRACTION PHACO AND INTRAOCULAR LENS PLACEMENT (IOC) RIGHT DIABETIC 10.93 00:53.0;  Surgeon: Birder Robson, MD;  Location: [facility];  Service: Ophthalmology;  Laterality: Right;   CATARACT EXTRACTION W/PHACO Left 04/28/2022   Procedure: CATARACT EXTRACTION PHACO AND INTRAOCULAR LENS PLACEMENT (IOC) LEFT DIABETIC 6.05 00:45.7;  Surgeon: Birder Robson, MD;  Location: [facility];  Service: Ophthalmology;  Laterality: Left;  Diabetic   CHOLECYSTECTOMY     HERNIA REPAIR     OTHER SURGICAL HISTORY     scar tissue removal from bowels   PARTIAL HYSTERECTOMY     Still has ovaries   UTERINE FIBROID SURGERY      Social History   Tobacco Use   Smoking status: Never   Smokeless tobacco: Never  Vaping Use   Vaping Use: Never used  Substance Use Topics   Alcohol use: No    Alcohol/week: 0.0 standard drinks of alcohol    Comment: rarely   Drug use: No    Family History  Problem Relation Age of Onset   Hyperlipidemia Mother    Cancer Sister    Hyperlipidemia Maternal Aunt    Diabetes Maternal Aunt    Breast cancer Cousin    Diabetes Maternal Uncle     No Known Allergies  Medication list has been reviewed and updated.  Current Outpatient Medications on File Prior to Visit  Medication Sig Dispense Refill   Accu-Chek Softclix Lancets lancets  Use to check blood sugar once day.  Dx code: E11.9 100 each 12   Alpha-Lipoic Acid 300 MG CAPS Take 2 capsules by mouth daily.     amLODipine (NORVASC) 5 MG tablet TAKE 1 AND 1/2 TABLETS BY MOUTH  DAILY (Patient taking differently: Take 7.5 mg by mouth daily.) 120 tablet 3   aspirin 81 MG tablet Take 81 mg by mouth daily.     atorvastatin (LIPITOR) 40 MG tablet TAKE 1 TABLET BY MOUTH DAILY 100 tablet 2   Biotin w/ Vitamins C & E (HAIR/SKIN/NAILS PO) Take 6,000 mcg by mouth daily. 2 tabs daily     carvedilol (COREG) 12.5 MG tablet TAKE 1 TABLET BY MOUTH  TWICE DAILY (Patient taking differently: Take 12.5 mg by mouth 2 (two) times daily with a meal.) 180 tablet 3   Cholecalciferol (VITAMIN D3) 125 MCG (5000 UT) CAPS Take 1 capsule (5,000 Units total) by mouth daily.     cloNIDine (CATAPRES) 0.2 MG tablet TAKE 1 TABLET BY MOUTH 3 TIMES  DAILY (Patient taking differently: Take 0.2 mg by mouth 2 (  two) times daily.) 240 tablet 3   Cobalamin Combinations (B-12) [phone] MCG SUBL B-12 Plus 5,000 mcg-100 mcg sublingual tablet     diclofenac Sodium (VOLTAREN) 1 % GEL Apply 4 g topically 4 (four) times daily. (Patient taking differently: Apply 4 g topically daily as needed.) 100 g 0   ferrous sulfate 325 (65 FE) MG tablet Take 325 mg by mouth daily with breakfast.     gabapentin (NEURONTIN) 400 MG capsule TAKE 2 CAPSULES BY MOUTH 3 TIMES DAILY (Patient taking differently: Take 800 mg by mouth 3 (three) times daily.) 600 capsule 2   gatifloxacin (ZYMAXID) 0.5 % SOLN Place 1 drop into both eyes 4 (four) times daily.     GEMTESA 75 MG TABS Take 75 mg by mouth daily.     glucose blood (ACCU-CHEK GUIDE) test strip Use to check blood sugar once day.  Dx code: E11.9 100 each 1   hydrochlorothiazide (HYDRODIURIL) 25 MG tablet TAKE 1 TABLET BY MOUTH DAILY 80 tablet 3   isosorbide mononitrate (IMDUR) 30 MG 24 hr tablet Take 1 tablet (30 mg total) by mouth daily. 90 tablet 3   metFORMIN (GLUCOPHAGE) 1000 MG tablet TAKE  1 TABLET BY MOUTH TWICE  DAILY WITH MEALS 180 tablet 3   methimazole (TAPAZOLE) 5 MG tablet Take 2.5 mg by mouth daily.     methocarbamol (ROBAXIN) 500 MG tablet Take 1 tablet (500 mg total) by mouth every 8 (eight) hours as needed for muscle spasms. 30 tablet 0   Multiple Vitamin (MULTIVITAMIN ADULT PO) Take 1 tablet by mouth daily. 2 gummy daily centrum over 50     nystatin cream (MYCOSTATIN) Apply 1 application topically 2 (two) times daily. (Patient taking differently: Apply 1 application  topically 2 (two) times daily as needed.) 30 g 1   OZEMPIC, 0.25 OR 0.5 MG/DOSE, 2 MG/3ML SOPN Inject 0.25 mg as directed once a week.     polyvinyl alcohol (LIQUIFILM TEARS) 1.4 % ophthalmic solution Place 1 drop into both eyes as needed for dry eyes. 15 mL 0   potassium chloride SA (KLOR-CON M) 20 MEQ tablet TAKE 1 TABLET BY MOUTH DAILY 80 tablet 3   prednisoLONE acetate (PRED FORTE) 1 % ophthalmic suspension Place 1 drop into both eyes 4 (four) times daily. 5 mL 0   TURMERIC PO Take 500 mg by mouth daily.     vitamin C (ASCORBIC ACID) 500 MG tablet Take 1,000 mg by mouth daily. gummy     No current facility-administered medications on file prior to visit.    Review of Systems:  As per HPI- otherwise negative.   Physical Examination: Vitals:   06/01/22 0930  BP: (!) 144/80  Pulse: 64  Resp: 18  Temp: 97.8 F (36.6 C)  SpO2: 99%   Vitals:   06/01/22 0930  Weight: 210 lb (95.3 kg)  Height: '5\' 5"'$  (1.651 m)   Body mass index is 34.95 kg/m. Ideal Body Weight: Weight in (lb) to have BMI = 25: 149.9  GEN: no acute distress. Obese, looks well  HEENT: Atraumatic, Normocephalic.  Ears and Nose: No external deformity. CV: RRR, No M/G/R. No JVD. No thrill. No extra heart sounds. PULM: CTA B, no wheezes, crackles, rhonchi. No retractions. No resp. distress. No accessory muscle use. ABD: S, NT, ND, +BS. No rebound. No HSM. EXTR: No c/c/- stable chronic LE edema bilaterally  PSYCH: Normally  interactive. Conversant.    Assessment and Plan: Hospital discharge follow-up - Plan: CBC  ARI (acute respiratory  infection) - Plan: Basic metabolic panel  Acute renal failure, unspecified acute renal failure type ([place]) - Plan: Ambulatory referral to Nephrology  Lower extremity edema - Plan: B Nat Peptide  Hypertension, unspecified type - Plan: irbesartan (AVAPRO) 300 MG tablet Flu shot today  Recommend covid booster and shingrix She is still taking her irbersartan- added back to her list  Await her labs from today- she was admitted with ARI likely due to illness and poor oral intake Advised she may need to hold her ozempic depending on her labs today-if kidneys are not recovering well we want to ensure good oral intake for hydration Referral made to nephrology She notes a cough- no other sx of illness right now- recent CXR clear and she is taking omnicef,  tesalon perles rx today   Signed [name], MD  Received her labs- message to pt Baseline creatinine is 1-1.1, still not back at baseline but improved from hospitalization Hemoglobin has dropped but she may have been artificially high due to dehydration-we will check a stool test BNP is in middle range, will touch base with her cardiologist Wt Readings from Last 3 Encounters:  06/01/22 210 lb (95.3 kg)  05/21/22 196 lb 3.4 oz (89 kg)  05/20/22 197 lb 12.8 oz (89.7 kg)    Results for orders placed or performed in visit on 06/01/22  CBC  Result Value Ref Range   WBC 6.3 4.0 - 10.5 K/uL   RBC 3.41 (L) 3.87 - 5.11 Mil/uL   Platelets 256.0 150.0 - 400.0 K/uL   Hemoglobin 9.9 (L) 12.0 - 15.0 g/dL   HCT 30.5 (L) 36.0 - 46.0 %   MCV 89.4 78.0 - 100.0 fl   MCHC 32.7 30.0 - 36.0 g/dL   RDW 14.1 11.5 - 94.5 %  Basic metabolic panel  Result Value Ref Range   Sodium 139 135 - 145 mEq/L   Potassium 4.5 3.5 - 5.1 mEq/L   Chloride 105 96 - 112 mEq/L   CO2 27 19 - 32 mEq/L   Glucose, Bld 84 70 - 99 mg/dL   BUN 30 (H) 6 -  23 mg/dL   Creatinine, Ser 1.50 (H) 0.40 - 1.20 mg/dL   GFR 35.12 (L) >60.00 mL/min   Calcium 9.7 8.4 - 10.5 mg/dL  B Nat Peptide  Result Value Ref Range   Pro B Natriuretic peptide (BNP) 228.0 (H) 0.0 - 100.0 pg/mL   Echo in May

## 2022-05-28 NOTE — Patient Instructions (Addendum)
Good to see you again today!  I am glad you are feeling better  I will be in touch with your labs asap Referral made to nephrology - it will likely be several weeks before they see you which is ok I think the cough will gradually get better but let me know if this is not true

## 2022-06-01 ENCOUNTER — Ambulatory Visit (INDEPENDENT_AMBULATORY_CARE_PROVIDER_SITE_OTHER): Payer: Medicare Other | Admitting: Family Medicine

## 2022-06-01 ENCOUNTER — Encounter: Payer: Self-pay | Admitting: Family Medicine

## 2022-06-01 VITALS — BP 144/80 | HR 64 | Temp 97.8°F | Resp 18 | Ht 65.0 in | Wt 210.0 lb

## 2022-06-01 DIAGNOSIS — Z79899 Other long term (current) drug therapy: Secondary | ICD-10-CM

## 2022-06-01 DIAGNOSIS — J22 Unspecified acute lower respiratory infection: Secondary | ICD-10-CM | POA: Diagnosis not present

## 2022-06-01 DIAGNOSIS — D649 Anemia, unspecified: Secondary | ICD-10-CM | POA: Diagnosis not present

## 2022-06-01 DIAGNOSIS — D7389 Other diseases of spleen: Secondary | ICD-10-CM

## 2022-06-01 DIAGNOSIS — R600 Localized edema: Secondary | ICD-10-CM | POA: Diagnosis not present

## 2022-06-01 DIAGNOSIS — N179 Acute kidney failure, unspecified: Secondary | ICD-10-CM | POA: Diagnosis not present

## 2022-06-01 DIAGNOSIS — Z23 Encounter for immunization: Secondary | ICD-10-CM

## 2022-06-01 DIAGNOSIS — I10 Essential (primary) hypertension: Secondary | ICD-10-CM | POA: Diagnosis not present

## 2022-06-01 DIAGNOSIS — Z09 Encounter for follow-up examination after completed treatment for conditions other than malignant neoplasm: Secondary | ICD-10-CM | POA: Diagnosis not present

## 2022-06-01 LAB — BASIC METABOLIC PANEL
BUN: 30 mg/dL — ABNORMAL HIGH (ref 6–23)
CO2: 27 mEq/L (ref 19–32)
Calcium: 9.7 mg/dL (ref 8.4–10.5)
Chloride: 105 mEq/L (ref 96–112)
Creatinine, Ser: 1.5 mg/dL — ABNORMAL HIGH (ref 0.40–1.20)
GFR: 35.12 mL/min — ABNORMAL LOW (ref >60.00)
Glucose, Bld: 84 mg/dL (ref 70–99)
Potassium: 4.5 mEq/L (ref 3.5–5.1)
Sodium: 139 mEq/L (ref 135–145)

## 2022-06-01 LAB — CBC
HCT: 30.5 % — ABNORMAL LOW (ref 36.0–46.0)
Hemoglobin: 9.9 g/dL — ABNORMAL LOW (ref 12.0–15.0)
MCHC: 32.7 g/dL (ref 30.0–36.0)
MCV: 89.4 fl (ref 78.0–100.0)
Platelets: 256 10*3/uL (ref 150.0–400.0)
RBC: 3.41 Mil/uL — ABNORMAL LOW (ref 3.87–5.11)
RDW: 14.1 % (ref 11.5–15.5)
WBC: 6.3 10*3/uL (ref 4.0–10.5)

## 2022-06-01 LAB — BRAIN NATRIURETIC PEPTIDE: Pro B Natriuretic peptide (BNP): 228 pg/mL — ABNORMAL HIGH (ref 0.0–100.0)

## 2022-06-01 MED ORDER — BENZONATATE 200 MG PO CAPS: 200.0000 mg | ORAL_CAPSULE | Freq: Three times a day (TID) | ORAL | 1 refills | Status: DC | PRN

## 2022-06-01 NOTE — Addendum Note (Signed)
Addended by: [name] C on: 06/01/2022 07:51 PM   Modules accepted: Orders

## 2022-06-04 DIAGNOSIS — M25811 Other specified joint disorders, right shoulder: Secondary | ICD-10-CM | POA: Diagnosis not present

## 2022-06-04 DIAGNOSIS — M25511 Pain in right shoulder: Secondary | ICD-10-CM | POA: Diagnosis not present

## 2022-06-07 MED ORDER — TORSEMIDE 10 MG PO TABS: 10.0000 mg | ORAL_TABLET | Freq: Every day | ORAL | 3 refills | Status: DC

## 2022-06-07 NOTE — Addendum Note (Signed)
Addended by: [name] C on: 06/07/2022 07:53 PM   Modules accepted: Orders

## 2022-06-08 ENCOUNTER — Telehealth: Payer: Self-pay | Admitting: Family Medicine

## 2022-06-08 ENCOUNTER — Other Ambulatory Visit: Payer: Self-pay | Admitting: Family Medicine

## 2022-06-08 NOTE — Telephone Encounter (Signed)
Pt called wondering if the MRI that was scheduled for her was supposed to be for now or 6 months from now. After reviewing chart, did see where Dr. Lorelei Pont had put 6 month recheck recommended but was unsure if that meant MRI was supposed to be scheduled 6 months from now. Advised pt a note would be sent back to get clarification on when that MRI is supposed to be. Pt is going to cancel appt scheduled until clarification is provided.

## 2022-06-08 NOTE — Telephone Encounter (Signed)
Looks like from the [employer] that you sent the pt- this was for 6 months out?

## 2022-06-09 ENCOUNTER — Other Ambulatory Visit: Payer: Self-pay | Admitting: Family Medicine

## 2022-06-09 DIAGNOSIS — E119 Type 2 diabetes mellitus without complications: Secondary | ICD-10-CM

## 2022-06-16 ENCOUNTER — Telehealth (INDEPENDENT_AMBULATORY_CARE_PROVIDER_SITE_OTHER): Payer: Medicare Other | Admitting: Family Medicine

## 2022-06-16 ENCOUNTER — Encounter: Payer: Self-pay | Admitting: Family Medicine

## 2022-06-16 VITALS — Ht 65.0 in | Wt 196.0 lb

## 2022-06-16 DIAGNOSIS — Z0000 Encounter for general adult medical examination without abnormal findings: Secondary | ICD-10-CM | POA: Diagnosis not present

## 2022-06-16 NOTE — Progress Notes (Signed)
PATIENT CHECK-IN and HEALTH RISK ASSESSMENT QUESTIONNAIRE:  -completed by phone/video for upcoming Medicare Preventive Visit  Pre-Visit Check-in: 1)Vitals (height, wt, BP, etc) - record in vitals section for visit on day of visit 2)Review and Update Medications, Allergies PMH, Surgeries, Social history in Epic 3)Hospitalizations in the last year with date/reason? 05/21/2022 Acute Kidney injury with dehydration for flu like illness per her report, had follow up with PCP wit kidney check and repeat check is scheduled 4)Review and Update Care Team (patient's specialists) in Epic 5) Complete PHQ9 in Epic  6) Complete Fall Screening in [place] Maintenance Due and order under PCP if not done.  8)Medicare Wellness Questionnaire: Answer theses question about your habits: Do you drink alcohol? No If yes, how many drinks do you have a day?N/A Have you ever smoked? No Quit date if applicable? N/A  How many packs a day do/did you smoke? N/A Do you use smokeless tobacco? N/A Do you use an illicit drugs? N/A Do you exercise? No has R knee OA which is limiting, but desires to exercise, reports used to go to the gym and really enjoyed it, tries to walk up and down her hall a few times throughout the day. Sees ortho. She prefers to avoid surgery.  Are you sexually active? No Number of partners? N/A Typical breakfast: Does not eat Breakfast ususally has brunch Typical lunch: Bagel, cereal Typical dinner: Balance meal, meat, potatoes, rice, vegetables Typical snacks: Pt has not been snacking since on Ozempic, use to eat chips, sweets. Sge will sometimes eat grapes, apples or oranges.  Beverages: 1 cup of coffee a day, a lot of water, no soda  Answer theses question about you: Can you perform most household chores? Yes Do you find it hard to follow a conversation in a noisy room? No  Do you often ask people to speak up or repeat themselves? No Do you feel that you have a problem with  memory? No Do you balance your checkbook and or bank acounts? Yes Do you feel safe at home? Yes Last dentist visit? Over a year ago, need to find a new dentist. Do you need assistance with any of the following: Please note if so   Driving? Yes, due to eye drops from Cataract Sx  Feeding yourself? No  Getting from bed to chair? No  Getting to the toilet? No  Bathing or showering? No   Dressing yourself? No  Managing money? No  Climbing a flight of stairs? Yes, due to right knee  Preparing meals? No  Do you have Advanced Directives in place (Living Will, Healthcare Power or Attorney)? No   Last eye Exam and location?06/15/2022, Dr. Michelene Heady   Do you currently use prescribed or non-prescribed narcotic or opioid pain medications? No  Do you have a history or close family history of breast, ovarian, tubal or peritoneal cancer or a family member with BRCA (breast cancer susceptibility 1 and 2) gene mutations? Sister ovarian cancer  Nurse/Assistant Credentials/time stamp: 06/16/2022 at 4:55 pm, I acted as a Education administrator for Dr. Colin Benton / Anselmo Pickler, LPN    ----------------------------------------------------------------------------------------------------------------------------------------------------------------------------------------------------------------------   [place]: (Welcome to Medicare, initial annual wellness or annual wellness exam)  Virtual Visit via Video Note  I connected with 06/16/22  on 11/28/223 by a video enabled telemedicine application and verified that I am speaking with the correct person using two identifiers.  Location patient: home Location provider:work or home office Persons participating in the virtual  visit: patient, provider  Concerns and/or follow up today: none other than knee issues above   See HM section in Epic for other details of completed HM.    ROS: negative for report of fevers,  unintentional weight loss, vision changes, vision loss, hearing loss or change, chest pain, sob, hemoptysis, melena, hematochezia, hematuria, genital discharge or lesions, falls, bleeding or bruising, loc, thoughts of suicide or self harm, memory loss  Patient-completed extensive health risk assessment - reviewed and discussed with the patient: See Health Risk Assessment completed with patient prior to the visit either above or in recent phone note. This was reviewed in detailed with the patient today and appropriate recommendations, orders and referrals were placed as needed per Summary below and patient instructions.   Review of Medical History: -PMH, PSH, Family History and current specialty and care providers reviewed and updated and listed below   Patient Care Team: Copland, Gay Filler, MD as PCP - General (Family Medicine) Berniece Salines, DO as PCP - Cardiology (Cardiology)   Past Medical History:  Diagnosis Date   Arthritis    Diabetes mellitus without complication [facility])    Family history of adverse reaction to anesthesia    PONV   Graves disease 04/14/2020   Hypertension    Mixed hyperlipidemia 02/20/2021    Past Surgical History:  Procedure Laterality Date   CATARACT EXTRACTION W/PHACO Right 04/14/2022   Procedure: CATARACT EXTRACTION PHACO AND INTRAOCULAR LENS PLACEMENT (IOC) RIGHT DIABETIC 10.93 00:53.0;  Surgeon: Birder Robson, MD;  Location: [facility];  Service: Ophthalmology;  Laterality: Right;   CATARACT EXTRACTION W/PHACO Left 04/28/2022   Procedure: CATARACT EXTRACTION PHACO AND INTRAOCULAR LENS PLACEMENT (IOC) LEFT DIABETIC 6.05 00:45.7;  Surgeon: Birder Robson, MD;  Location: [facility];  Service: Ophthalmology;  Laterality: Left;  Diabetic   CHOLECYSTECTOMY     HERNIA REPAIR     OTHER SURGICAL HISTORY     scar tissue removal from bowels   PARTIAL HYSTERECTOMY     Still has ovaries   UTERINE FIBROID SURGERY      Social History    Socioeconomic History   Marital status: Married    Spouse name: [name]   Number of children: 0   Years of education: 16   Highest education level: Not on file  Occupational History   Occupation: customer service  Tobacco Use   Smoking status: Never   Smokeless tobacco: Never  Vaping Use   Vaping Use: Never used  Substance and Sexual Activity   Alcohol use: No    Alcohol/week: 0.0 standard drinks of alcohol    Comment: rarely   Drug use: No   Sexual activity: Not Currently    Birth control/protection: Post-menopausal  Other Topics Concern   Not on file  Social History Narrative   Lives with husband   Caffeine use: 16oz coffee per day   Social Determinants of Health   Financial Resource Strain: Medium Risk (06/06/2021)   Overall Financial Resource Strain (CARDIA)    Difficulty of Paying Living Expenses: Somewhat hard  Food Insecurity: No Food Insecurity (05/22/2022)   Hunger Vital Sign    Worried About Running Out of Food in the Last Year: Never true    Ran Out of Food in the Last Year: Never true  Transportation Needs: No Transportation Needs (05/22/2022)   PRAPARE - Hydrologist (Medical): No    Lack of Transportation (Non-Medical): No  Physical Activity: Inactive (06/06/2021)   Exercise Vital Sign  Days of Exercise per Week: 0 days    Minutes of Exercise per Session: 0 min  Stress: No Stress Concern Present (06/06/2021)   [place]    Feeling of Stress : Not at all  Social Connections: Not on file  Intimate Partner Violence: Not At Risk (05/22/2022)   Humiliation, Afraid, Rape, and Kick questionnaire    Fear of Current or Ex-Partner: No    Emotionally Abused: No    Physically Abused: No    Sexually Abused: No    Family History  Problem Relation Age of Onset   Hyperlipidemia Mother    Cancer Sister    Hyperlipidemia Maternal Aunt    Diabetes Maternal Aunt     Breast cancer Cousin    Diabetes Maternal Uncle     Current Outpatient Medications on File Prior to Visit  Medication Sig Dispense Refill   Accu-Chek Softclix Lancets lancets Check blood sugars once daily 100 each 12   Alpha-Lipoic Acid 300 MG CAPS Take 2 capsules by mouth daily.     amLODipine (NORVASC) 5 MG tablet TAKE 1 AND 1/2 TABLETS BY MOUTH  DAILY (Patient taking differently: Take 7.5 mg by mouth daily.) 120 tablet 3   aspirin 81 MG tablet Take 81 mg by mouth daily.     atorvastatin (LIPITOR) 40 MG tablet TAKE 1 TABLET BY MOUTH DAILY 100 tablet 2   benzonatate (TESSALON) 200 MG capsule Take 1 capsule (200 mg total) by mouth 3 (three) times daily as needed for cough. 60 capsule 1   Biotin w/ Vitamins C & E (HAIR/SKIN/NAILS PO) Take 6,000 mcg by mouth daily. 2 tabs daily     carvedilol (COREG) 12.5 MG tablet TAKE 1 TABLET BY MOUTH  TWICE DAILY (Patient taking differently: Take 12.5 mg by mouth 2 (two) times daily with a meal.) 180 tablet 3   Cholecalciferol (VITAMIN D3) 125 MCG (5000 UT) CAPS Take 1 capsule (5,000 Units total) by mouth daily.     cloNIDine (CATAPRES) 0.2 MG tablet TAKE 1 TABLET BY MOUTH 3 TIMES  DAILY (Patient taking differently: Take 0.2 mg by mouth 2 (two) times daily.) 240 tablet 3   Cobalamin Combinations (B-12) [id number] MCG SUBL B-12 Plus 5,000 mcg-100 mcg sublingual tablet     diclofenac Sodium (VOLTAREN) 1 % GEL Apply 4 g topically 4 (four) times daily. (Patient taking differently: Apply 4 g topically daily as needed.) 100 g 0   ferrous sulfate 325 (65 FE) MG tablet Take 325 mg by mouth daily with breakfast.     gabapentin (NEURONTIN) 400 MG capsule TAKE 2 CAPSULES BY MOUTH 3 TIMES DAILY (Patient taking differently: Take 800 mg by mouth 3 (three) times daily.) 600 capsule 2   gatifloxacin (ZYMAXID) 0.5 % SOLN Place 1 drop into both eyes 4 (four) times daily.     GEMTESA 75 MG TABS Take 75 mg by mouth daily.     glucose blood (ACCU-CHEK GUIDE) test strip Check blood  sugars once daily 100 strip 12   hydrochlorothiazide (HYDRODIURIL) 25 MG tablet TAKE 1 TABLET BY MOUTH DAILY 80 tablet 3   irbesartan (AVAPRO) 300 MG tablet Take 1 tablet (300 mg total) by mouth daily.     isosorbide mononitrate (IMDUR) 30 MG 24 hr tablet Take 1 tablet (30 mg total) by mouth daily. 90 tablet 3   metFORMIN (GLUCOPHAGE) 1000 MG tablet TAKE 1 TABLET BY MOUTH TWICE  DAILY WITH MEALS 180 tablet 3   methimazole (TAPAZOLE)  5 MG tablet Take 2.5 mg by mouth daily.     methocarbamol (ROBAXIN) 500 MG tablet Take 1 tablet (500 mg total) by mouth every 8 (eight) hours as needed for muscle spasms. 30 tablet 0   Multiple Vitamin (MULTIVITAMIN ADULT PO) Take 1 tablet by mouth daily. 2 gummy daily centrum over 50     nystatin cream (MYCOSTATIN) Apply 1 application topically 2 (two) times daily. (Patient taking differently: Apply 1 application  topically 2 (two) times daily as needed.) 30 g 1   OZEMPIC, 0.25 OR 0.5 MG/DOSE, 2 MG/3ML SOPN Inject 0.5 mg as directed once a week.     potassium chloride SA (KLOR-CON M) 20 MEQ tablet TAKE 1 TABLET BY MOUTH DAILY 80 tablet 3   prednisoLONE acetate (PRED FORTE) 1 % ophthalmic suspension Place 1 drop into both eyes 4 (four) times daily. 5 mL 0   torsemide (DEMADEX) 10 MG tablet Take 1 tablet (10 mg total) by mouth daily. Use as needed for lower extremity swelling 30 tablet 3   TURMERIC PO Take 500 mg by mouth daily.     vitamin C (ASCORBIC ACID) 500 MG tablet Take 1,000 mg by mouth daily. gummy     No current facility-administered medications on file prior to visit.    No Known Allergies     Physical Exam There were no vitals filed for this visit. Estimated body mass index is 32.62 kg/m as calculated from the following:   Height as of this encounter: _0  (1.651 m).   Weight as of this encounter: 196 lb (88.9 kg).  EKG (optional): deferred due to virtual visit  GENERAL: alert, oriented, appears well and in no acute distress; visual acuity  grossly intact, full vision exam deferred due to pandemic and/or virtual encounter  HEENT: atraumatic, conjunttiva clear, no obvious abnormalities on inspection of external nose and ears  NECK: normal movements of the head and neck  LUNGS: on inspection no signs of respiratory distress, breathing rate appears normal, no obvious gross SOB, gasping or wheezing  CV: no obvious cyanosis  MS: moves all visible extremities without noticeable abnormality  PSYCH/NEURO: pleasant and cooperative, no obvious depression or anxiety, speech and thought processing grossly intact, Cognitive function grossly intact  [place] Office Visit from 10/01/2021 in [place] [facility]  PHQ-9 Total Score 0           06/16/2022    4:40 PM 06/01/2022    9:38 AM 10/01/2021    1:46 PM 06/06/2021    8:32 AM 02/13/2021    1:34 PM  Depression screen PHQ 2/9  Decreased Interest 0 0 0 0 0  Down, Depressed, Hopeless 0 0 0 1 0  PHQ - 2 Score 0 0 0 1 0  Altered sleeping   0    Tired, decreased energy   0    Change in appetite   0    Feeling bad or failure about yourself    0    Trouble concentrating   0    Moving slowly or fidgety/restless   0    Suicidal thoughts   0    PHQ-9 Score   0    Difficult doing work/chores   Not difficult at all         05/22/2022    4:36 PM 05/22/2022   10:37 PM 05/23/2022    8:00 AM 06/01/2022    9:38 AM 06/16/2022    4:40 PM  Fall Risk  Falls in the past year?    0 1  Was there an injury with Fall?    0 1  Fall Risk Category Calculator    0 2  Fall Risk Category    Low Moderate  Patient Fall Risk Level Moderate fall risk Moderate fall risk Moderate fall risk Low fall risk Moderate fall risk - pt denies this reports fall was from chair when fell asleep when recovering from illness  Patient at Risk for Falls Due to     Impaired balance/gait  Patient at Risk for Falls Due to - Comments     Pt fell off her chair  Fall risk Follow up     Falls evaluation completed Falls evaluation completed  Fall was not from walking or balance issues! She drifted off to sleep in chair and fell from chair - was recovering from illness and fell asleep watching TV.    SUMMARY AND PLAN:  Medicare annual wellness visit, subsequent    Discussed applicable health maintenance/preventive health measures and advised and referred or ordered per patient preferences:  Health Maintenance  Topic Date Due   Zoster Vaccines- Shingrix (1 of 2) Advised of options for scheduling   COVID-19 Vaccine (6 - 2023-24 season) Due advised of options for obtaining   Fecal DNA (Cologuard)  07/18/2022 this can be ordered through your primary care office   FOOT EXAM  10/02/2022   HEMOGLOBIN A1C  11/20/2022   Diabetic kidney evaluation - Urine ACR  01/07/2023   OPHTHALMOLOGY EXAM  02/19/2023   Diabetic kidney evaluation - GFR measurement  06/02/2023   Medicare Annual Wellness (AWV)  06/17/2023   MAMMOGRAM  08/21/2023   Pneumonia Vaccine 24+ Years old  Completed   INFLUENZA VACCINE  Completed   DEXA SCAN  Completed 08/28/2020, Normal, can repeat every 2 years with primary care doctor   Hepatitis C Screening  Completed   HPV VACCINES  Aged Out     Education and counseling on the following was provided based on the above review of health and a plan/checklist for the patient, along with additional information discussed, was provided for the patient in the patient instructions :  -Advised on importance of and resources for completing advanced directives - provided further information in patient instructions -Provided counseling and plan for increased risk of falling if applicable per above screening. She plans to consider PT, work on safe/careful balance exercises, consider gradual careful exercise - water exercise, reclined elliptical, etc may be gentler on the knee, also discussed chair exercises -Provided counseling and plan for function difficulties/ difficulties  with ADLs if applicable per above screening. -Advised and counseled on maintaining healthy weight and healthy lifestyle - including the importance of a health diet, regular physical activity, social connections and stress management. -Advised and counseled on a whole foods based healthy diet and regular exercise: discussed a heart healthy whole foods based diet at length. A summary of a healthy diet was provided in the Patient Instructions. -Recommended regular exercise and discussed options within the community.  -Advised yearly dental visits at minimum and regular eye exams -Advised and counseled on alcohol limits per her request advised against any more than a small glass of wine in one day given on many medications  Follow up: see patient instructions     Patient Instructions  I really enjoyed getting to talk with you today! I am available on Tuesdays and Thursdays for virtual visits if you have any questions or concerns, or if I can  be of any further assistance.   CHECKLIST FROM ANNUAL WELLNESS VISIT:  -Follow up (please call to schedule if not scheduled after visit):  -regular follow up with your primary care office as planned at your last visit -yearly for annual wellness visit with primary care office  Here is a list of your preventive care/health maintenance measures and the plan for each if any are due:  Health Maintenance  Topic Date Due   Zoster Vaccines- Shingrix (1 of 2) Never done -can schedule with pharmacy or doctor office   COVID-19 Vaccine (6 - 2023-24 season) 03/20/2022- can schedule with pharmacy   Fecal DNA (Cologuard)  07/18/2022 -contact primary care office to order   FOOT EXAM  10/02/2022   HEMOGLOBIN A1C  11/20/2022   Diabetic kidney evaluation - Urine ACR  01/07/2023   OPHTHALMOLOGY EXAM  02/19/2023   Diabetic kidney evaluation - GFR measurement  06/02/2023   Medicare Annual Wellness (AWV)  06/17/2023   MAMMOGRAM  08/21/2023   Pneumonia Vaccine 65+ Years  old  Completed   INFLUENZA VACCINE  Completed   DEXA SCAN  Completed   Hepatitis C Screening  Completed   HPV VACCINES  Aged Out    -See a dentist at least yearly  -Get your eyes checked per your eye specialist's recommendations  -Other issues addressed today: -consider physical therapy for the knee (talk with your orthopedic doctor), water or seated exercise, balance exercises -advanced directives  -I have included below further information regarding a healthy whole foods based diet, physical activity guidelines for adults, stress management and opportunities for social connections. I hope you find this information useful.   -----------------------------------------------------------------------------------------------------------------------------------------------------------------------------------------------------------------------------------------------------------  FOOD - THE FUEL FOR A HAPPY HEALTHY LIFE: -eat real food: lots of colorful vegetables (half the plate) -5-7 servings of vegetables and fruits per day (fresh or steamed is best), exp. 2 servings of vegetables with lunch and dinner and 2 servings of fruit per day. Berries and greens such as kale and collards are great choices.  -consume on a regular basis: whole grains (make sure first ingredient on label contains the word "whole"), fresh fruits, fish, nuts, seeds, healthy oils (such as olive oil, avocado oil, grape seed oil) -may eat small amounts of dairy and lean meat on occasion, but avoid processed meats such as ham, bacon, lunch meat, etc. -drink water -try to avoid fast food and pre-packaged foods, processed meat -try to avoid foods that contain any ingredients with names you do not recognize  -try to avoid sugar/sweets (except for the natural sugar that occurs in fresh fruit) -try to avoid sweet drinks -try to avoid white rice, white bread, pasta (unless whole grain), white or yellow potatoes -do not consume  more than 1 glass of wine or small alcoholic beverage in any given day, would limit to special occasions  MOVE - the key to keeping your body moving and working best: -if you wish to increase your physical activity, do so gradually and with your doctor's approval  -move and stretch your body, legs, feet and arms when sitting for long periods -Physical activity guidelines for optimal health in adults: -least 150 minutes per week of aerobic exercise (can talk, but not sing) once approved by your doctor, 20-30 minutes of sustained activity or two 10 minute episodes of sustained activity every day.  -resistance training at least 2 days per week if approved by your doctor -balance exercises 3+ days per week:   Stand somewhere where you have something sturdy to hold  onto if you lose balance.    1) lift up on toes, start with 5x per day and work up to 20x   2) stand and lift on leg straight out to the side so that foot is a few inches of the floor, start with 5x each side and work up to 20x each side   3) stand on one foot, start with 5 seconds each side and work up to 20 seconds on each side  If you need ideas or help with getting more active:  -Silver sneakers https://tools.silversneakers.com  -Walk with a Doc: http://stephens-thompson.biz/  -try to include resistance (weight lifting/strength building) and balance exercises twice per week: or the following link for ideas: ChessContest.fr  UpdateClothing.com.cy  STRESS MANAGEMENT - so important for health and well being -try meditating, or just sitting quietly with deep breathing while intentionally relaxing all parts of your body for 5 minutes daily  SOCIAL CONNECTIONS: -options in [place] if you wish to engage in more social and exercise related activities:  -Silver sneakers https://tools.silversneakers.com  -Walk with a  Doc: http://stephens-thompson.biz/  -Check out the [place] 50+ section on the [place] [employer] (hiking clubs, book clubs, cards and games, chess, exercise classes, aquatic classes and much more) - see the website for details: https://www.[place].gov/departments/parks-recreation/active-adults50  -YouTube has lots of exercise videos for different ages and abilities as well  -[place] (a variety of indoor and outdoor inperson activities for adults). (716) 604-6628. [address]  -Virtual Online Classes (a variety of topics): see seniorplanet.org or call 726-456-9949  -consider volunteering at a school, hospice center, church, senior center or elsewhere  STRESS MANAGEMENT: -try meditating, or just sitting quietly with deep breathing while intentionally relaxing all parts of your body for 5 minutes daily  SOCIAL CONNECTIONS: -options in [place] if you wish to engage in more social and exercise related activities:  -Silver sneakers https://tools.silversneakers.com  -Walk with a Doc: http://stephens-thompson.biz/  -Check out the [place] 50+ section on the [place] [employer] (hiking clubs, book clubs, cards and games, chess, exercise classes, aquatic classes and much more) - see the website for details: https://www.[place].gov/departments/parks-recreation/active-adults50  -YouTube has lots of exercise videos for different ages and abilities as well  -[place] (a variety of indoor and outdoor inperson activities for adults). (708) 402-2004. [address]  -Virtual Online Classes (a variety of topics): see seniorplanet.org or call 7097586882  -consider volunteering at a school, hospice center, church, senior center or elsewhere       ADVANCED HEALTHCARE DIRECTIVES:  Everyone should have advanced health care directives in place. This is so that you get the care you want, should  you ever be in a situation where you are unable to make your own medical decisions.   From the [place] Advanced Directive Website: "[place] are legal documents in which you give written instructions about your health care if, in the future, you cannot speak for yourself.   A health care power of attorney allows you to name a person you trust to make your health care decisions if you cannot make them yourself. A declaration of a desire for a natural death (or living will) is document, which states that you desire not to have your life prolonged by extraordinary measures if you have a terminal or incurable illness or if you are in a vegetative state. An advance instruction for mental health treatment makes a declaration of instructions, information and preferences regarding your mental  health treatment. It also states that you are aware that the advance instruction authorizes a mental health treatment provider to act according to your wishes. It may also outline your consent or refusal of mental health treatment. A declaration of an anatomical gift allows anyone over the age of 31 to make a gift by will, organ donor card or other document."   Please see the following website or an elder law attorney for forms, FAQs and for completion of advanced directives: [place] [employer] of [employer] ([email or web address])  Or copy and paste the following to your web browser: [email or web address]         Lucretia Kern, DO ,

## 2022-06-16 NOTE — Patient Instructions (Addendum)
I really enjoyed getting to talk with you today! I am available on Tuesdays and Thursdays for virtual visits if you have any questions or concerns, or if I can be of any further assistance.   CHECKLIST FROM ANNUAL WELLNESS VISIT:  -Follow up (please call to schedule if not scheduled after visit):  -regular follow up with your primary care office as planned at your last visit -yearly for annual wellness visit with primary care office  Here is a list of your preventive care/health maintenance measures and the plan for each if any are due:  Health Maintenance  Topic Date Due   Zoster Vaccines- Shingrix (1 of 2) Never done -can schedule with pharmacy or doctor office   COVID-19 Vaccine (6 - 2023-24 season) 03/20/2022- can schedule with pharmacy   Fecal DNA (Cologuard)  07/18/2022 -contact primary care office to order   FOOT EXAM  10/02/2022   HEMOGLOBIN A1C  11/20/2022   Diabetic kidney evaluation - Urine ACR  01/07/2023   OPHTHALMOLOGY EXAM  02/19/2023   Diabetic kidney evaluation - GFR measurement  06/02/2023   Medicare Annual Wellness (AWV)  06/17/2023   MAMMOGRAM  08/21/2023   Pneumonia Vaccine 64+ Years old  Completed   INFLUENZA VACCINE  Completed   DEXA SCAN  Completed   Hepatitis C Screening  Completed   HPV VACCINES  Aged Out    -See a dentist at least yearly  -Get your eyes checked per your eye specialist's recommendations  -Other issues addressed today: -consider physical therapy for the knee (talk with your orthopedic doctor), water or seated exercise, balance exercises -advanced directives  -I have included below further information regarding a healthy whole foods based diet, physical activity guidelines for adults, stress management and opportunities for social connections. I hope you find this information useful.      FOOD - THE FUEL FOR A HAPPY HEALTHY LIFE: -eat real food: lots of colorful vegetables (half the plate) -5-7 servings of vegetables and fruits per day (fresh or steamed is best), exp. 2 servings of vegetables with lunch and dinner and 2 servings of fruit per day. Berries and greens such as kale and collards are great choices.  -consume on a regular basis: whole grains (make sure first ingredient on label contains the word "whole"), fresh fruits, fish, nuts, seeds, healthy oils (such as olive oil, avocado oil, grape seed oil) -may eat small amounts of dairy and lean meat on occasion, but avoid processed meats such as ham, bacon, lunch meat, etc. -drink water -try to avoid fast food and pre-packaged foods, processed meat -try to avoid foods that contain any ingredients with names you do not recognize  -try to avoid sugar/sweets (except for the natural sugar that occurs in fresh fruit) -try to avoid sweet drinks -try to avoid white rice, white bread, pasta (unless whole grain), white or yellow potatoes -do not consume more than 1 glass of wine or small alcoholic beverage in any given day, would limit to special occasions  MOVE - the key to keeping your body moving and working best: -if you wish to increase your physical activity, do so gradually and with your doctor's approval  -move and stretch your body, legs, feet and arms when sitting for long periods -Physical activity guidelines for optimal health in adults: -least 150 minutes per week of aerobic exercise (can talk, but not sing) once approved by your doctor, 20-30 minutes of sustained activity or two 10 minute episodes of sustained activity every day.  -resistance  training at least 2 days  per week if approved by your doctor -balance exercises 3+ days per week:   Stand somewhere where you have something sturdy to hold onto if you lose balance.    1) lift up on toes, start with 5x per day and work up to 20x   2) stand and lift on leg straight out to the side so that foot is a few inches of the floor, start with 5x each side and work up to 20x each side   3) stand on one foot, start with 5 seconds each side and work up to 20 seconds on each side  If you need ideas or help with getting more active:  -Silver sneakers https://tools.silversneakers.com  -Walk with a Doc: http://stephens-thompson.biz/  -try to include resistance (weight lifting/strength building) and balance exercises twice per week: or the following link for ideas: ChessContest.fr  [email or web address]  STRESS MANAGEMENT - so important for health and well being -try meditating, or just sitting quietly with deep breathing while intentionally relaxing all parts of your body for 5 minutes daily  SOCIAL CONNECTIONS: -options in [place] if you wish to engage in more social and exercise related activities:  -Silver sneakers https://tools.silversneakers.com  -Walk with a Doc: http://stephens-thompson.biz/  -Check out the [place] 50+ section on the [place] [employer] (hiking clubs, book clubs, cards and games, chess, exercise classes, aquatic classes and much more) - see the website for details: https://www.[place].gov/departments/parks-recreation/active-adults50  -YouTube has lots of exercise videos for different ages and abilities as well  -[place] (a variety of indoor and outdoor inperson activities for adults). [phone]. [address]  -Virtual Online Classes (a variety of topics): see seniorplanet.org or call [phone]  -consider volunteering at a school,  hospice center, church, senior center or elsewhere  STRESS MANAGEMENT: -try meditating, or just sitting quietly with deep breathing while intentionally relaxing all parts of your body for 5 minutes daily  SOCIAL CONNECTIONS: -options in [place] if you wish to engage in more social and exercise related activities:  -Silver sneakers https://tools.silversneakers.com  -Walk with a Doc: http://stephens-thompson.biz/  -Check out the [place] 50+ section on the [place] [employer] (hiking clubs, book clubs, cards and games, chess, exercise classes, aquatic classes and much more) - see the website for details: https://www.[place].gov/departments/parks-recreation/active-adults50  -YouTube has lots of exercise videos for different ages and abilities as well  -[place] (a variety of indoor and outdoor inperson activities for adults). [phone]. [address]  -Virtual Online Classes (a variety of topics): see seniorplanet.org or call [phone]  -consider volunteering at a school, hospice center, church, senior center or elsewhere       ADVANCED HEALTHCARE DIRECTIVES:  Everyone should have advanced health care directives in place. This is so that you get the care you want, should you ever be in a situation where you are unable to make your own medical decisions.   From the [place] Advanced Directive Website: "[place] are legal documents in which you give written instructions about your health care if, in the future, you cannot speak for yourself.   A health care power of attorney allows you to name a person you trust to make your health care decisions if you cannot make them yourself. A declaration of a desire for a natural death (or living will) is document, which states that you desire not to have your life prolonged by extraordinary measures if you have a terminal  or incurable illness or if you are in a  vegetative state. An advance instruction for mental health treatment makes a declaration of instructions, information and preferences regarding your mental health treatment. It also states that you are aware that the advance instruction authorizes a mental health treatment provider to act according to your wishes. It may also outline your consent or refusal of mental health treatment. A declaration of an anatomical gift allows anyone over the age of 40 to make a gift by will, organ donor card or other document."   Please see the following website or an elder law attorney for forms, FAQs and for completion of advanced directives: [place] [employer] of [place] ([email or web address])  Or copy and paste the following to your web browser: [email or web address]

## 2022-06-17 ENCOUNTER — Ambulatory Visit (HOSPITAL_COMMUNITY): Payer: Medicare Other

## 2022-06-23 NOTE — Progress Notes (Unsigned)
[facility] [address], [place], [place] [address] [phone]  Date:  06/24/2022   Name:  Emily Wood   DOB:  September 13, 1950   MRN:  [id number]  PCP:  Darreld Mclean, MD    Chief Complaint: knot on leg (Low R leg. She said it was present a couple of weeks after a fall she had at she kitchen counter. She says it does hurt and feels like a burning sensation. )   History of Present Illness:  Emily Wood is a 71 y.o. very pleasant female patient who presents with the following:  Patient seen today with concern of a "knot" on her leg Most recent visit with myself was last month for hospital follow-up; she was admitted overnight with acute renal insufficiency with creatinine of 3.4, improved to 1.4 on upon discharge History of diabetes, chronic kidney disease, hypertension, venous insufficiency and lymphedema, obesity, Graves' hyperthyroidism in remission    She had mild elevation of her BNP I gave her torsemide 10 to use daily as needed for increased swelling I also ordered an MRI for 6 months from now follow-up on a splenic lesion previous MRI per her urologist Dr. Lovena Neighbours Pt notes she had a fall- she fell asleep and fell out of a chair about 2 weeks ago - she notes she hit her right shin against something, she is not quite sure what  She notes a knot in this area which has been persistent - it has improved some but continues to be tender   She is using the torsemide- she is taking this daily and notes that it does seem to help  She is taking 20 of potassium daily as well   She feels like her swelling is overall improved  The weight recorded 11/28 was per her home scale and she was ill, not eating much- so overall her weight is likely still down   BP Readings from Last 3 Encounters:  06/24/22 130/70  06/01/22 (!) 144/80  05/23/22 (!) 157/68   Wt Readings from Last 3 Encounters:  06/24/22 205 lb 9.6 oz  (93.3 kg)  06/16/22 196 lb (88.9 kg)  06/01/22 210 lb (95.3 kg)     Patient Active Problem List   Diagnosis Date Noted   Hypokalemia 05/23/2022   Normocytic anemia 05/23/2022   Eye pain, right 05/22/2022   AKI (acute kidney injury) ([place]) 05/21/2022   Coronary artery disease involving native heart 03/06/2022   Tricuspid valve insufficiency 11/05/2021   Hypertension 11/05/2021   Snoring 11/05/2021   Daytime somnolence 11/05/2021   Type 2 diabetes mellitus with hyperosmolarity without coma, without long-term current use of insulin ([place]) 11/05/2021   Coronary artery disease involving native coronary artery of native heart without angina pectoris 05/15/2021   Nonrheumatic tricuspid valve regurgitation 05/15/2021   Medication management 05/15/2021   Pulmonary hypertension, unspecified ([place]) 05/15/2021   Precordial pain 02/20/2021   Nonspecific abnormal electrocardiogram (ECG) (EKG) 02/20/2021   Mixed hyperlipidemia 02/20/2021   Obesity (BMI 30-39.9) 02/20/2021   Arthritis 02/18/2021   Diabetic retinopathy ([place]) 08/28/2020   Mixed incontinence 05/31/2020   Urinary urgency 05/31/2020   Graves disease 04/14/2020   Right renal mass 11/22/2019   Arthritis of left knee 09/25/2019   Lymphedema 01/25/2017   Fibroid uterus 01/14/2017   Hypertensive heart disease without heart failure 10/01/2016   Chronic kidney disease, stage 3 ([place]) 09/21/2016   Meralgia paresthetica of left side 08/27/2015  Diabetes mellitus type 2, controlled ([place]) 08/02/2015   Chronic venous insufficiency 06/28/2015   Essential hypertension 02/15/2015   Peripheral edema 02/15/2015   Chronic knee pain 02/15/2015    Past Medical History:  Diagnosis Date   Arthritis    Diabetes mellitus without complication ([place])    Family history of adverse reaction to anesthesia    PONV   Graves disease 04/14/2020   Hypertension    Mixed hyperlipidemia 02/20/2021    Past Surgical History:  Procedure Laterality Date    CATARACT EXTRACTION W/PHACO Right 04/14/2022   Procedure: CATARACT EXTRACTION PHACO AND INTRAOCULAR LENS PLACEMENT (IOC) RIGHT DIABETIC 10.93 00:53.0;  Surgeon: Birder Robson, MD;  Location: [facility];  Service: Ophthalmology;  Laterality: Right;   CATARACT EXTRACTION W/PHACO Left 04/28/2022   Procedure: CATARACT EXTRACTION PHACO AND INTRAOCULAR LENS PLACEMENT (IOC) LEFT DIABETIC 6.05 00:45.7;  Surgeon: Birder Robson, MD;  Location: [facility];  Service: Ophthalmology;  Laterality: Left;  Diabetic   CHOLECYSTECTOMY     HERNIA REPAIR     OTHER SURGICAL HISTORY     scar tissue removal from bowels   PARTIAL HYSTERECTOMY     Still has ovaries   UTERINE FIBROID SURGERY      Social History   Tobacco Use   Smoking status: Never   Smokeless tobacco: Never  Vaping Use   Vaping Use: Never used  Substance Use Topics   Alcohol use: No    Alcohol/week: 0.0 standard drinks of alcohol    Comment: rarely   Drug use: No    Family History  Problem Relation Age of Onset   Hyperlipidemia Mother    Cancer Sister    Hyperlipidemia Maternal Aunt    Diabetes Maternal Aunt    Breast cancer Cousin    Diabetes Maternal Uncle     No Known Allergies  Medication list has been reviewed and updated.  Current Outpatient Medications on File Prior to Visit  Medication Sig Dispense Refill   Accu-Chek Softclix Lancets lancets Check blood sugars once daily 100 each 12   Alpha-Lipoic Acid 300 MG CAPS Take 2 capsules by mouth daily.     amLODipine (NORVASC) 5 MG tablet TAKE 1 AND 1/2 TABLETS BY MOUTH  DAILY (Patient taking differently: Take 7.5 mg by mouth daily.) 120 tablet 3   aspirin 81 MG tablet Take 81 mg by mouth daily.     atorvastatin (LIPITOR) 40 MG tablet TAKE 1 TABLET BY MOUTH DAILY 100 tablet 2   Biotin w/ Vitamins C & E (HAIR/SKIN/NAILS PO) Take 6,000 mcg by mouth daily. 2 tabs daily     carvedilol (COREG) 12.5 MG tablet TAKE 1 TABLET BY MOUTH  TWICE DAILY (Patient  taking differently: Take 12.5 mg by mouth 2 (two) times daily with a meal.) 180 tablet 3   Cholecalciferol (VITAMIN D3) 125 MCG (5000 UT) CAPS Take 1 capsule (5,000 Units total) by mouth daily.     cloNIDine (CATAPRES) 0.2 MG tablet TAKE 1 TABLET BY MOUTH 3 TIMES  DAILY (Patient taking differently: Take 0.2 mg by mouth 2 (two) times daily.) 240 tablet 3   Cobalamin Combinations (B-12) [phone] MCG SUBL B-12 Plus 5,000 mcg-100 mcg sublingual tablet     diclofenac Sodium (VOLTAREN) 1 % GEL Apply 4 g topically 4 (four) times daily. (Patient taking differently: Apply 4 g topically daily as needed.) 100 g 0   ferrous sulfate 325 (65 FE) MG tablet Take 325 mg by mouth daily with breakfast.     gabapentin (  NEURONTIN) 400 MG capsule TAKE 2 CAPSULES BY MOUTH 3 TIMES DAILY (Patient taking differently: Take 800 mg by mouth 3 (three) times daily.) 600 capsule 2   gatifloxacin (ZYMAXID) 0.5 % SOLN Place 1 drop into both eyes 4 (four) times daily.     GEMTESA 75 MG TABS Take 75 mg by mouth daily.     glucose blood (ACCU-CHEK GUIDE) test strip Check blood sugars once daily 100 strip 12   hydrochlorothiazide (HYDRODIURIL) 25 MG tablet TAKE 1 TABLET BY MOUTH DAILY 80 tablet 3   irbesartan (AVAPRO) 300 MG tablet Take 1 tablet (300 mg total) by mouth daily.     isosorbide mononitrate (IMDUR) 30 MG 24 hr tablet Take 1 tablet (30 mg total) by mouth daily. 90 tablet 3   metFORMIN (GLUCOPHAGE) 1000 MG tablet TAKE 1 TABLET BY MOUTH TWICE  DAILY WITH MEALS 180 tablet 3   methimazole (TAPAZOLE) 5 MG tablet Take 2.5 mg by mouth daily.     methocarbamol (ROBAXIN) 500 MG tablet Take 1 tablet (500 mg total) by mouth every 8 (eight) hours as needed for muscle spasms. 30 tablet 0   Multiple Vitamin (MULTIVITAMIN ADULT PO) Take 1 tablet by mouth daily. 2 gummy daily centrum over 50     nystatin cream (MYCOSTATIN) Apply 1 application topically 2 (two) times daily. (Patient taking differently: Apply 1 application  topically 2 (two)  times daily as needed.) 30 g 1   OZEMPIC, 0.25 OR 0.5 MG/DOSE, 2 MG/3ML SOPN Inject 0.5 mg as directed once a week.     potassium chloride SA (KLOR-CON M) 20 MEQ tablet TAKE 1 TABLET BY MOUTH DAILY 80 tablet 3   prednisoLONE acetate (PRED FORTE) 1 % ophthalmic suspension Place 1 drop into both eyes 4 (four) times daily. 5 mL 0   torsemide (DEMADEX) 10 MG tablet Take 1 tablet (10 mg total) by mouth daily. Use as needed for lower extremity swelling 30 tablet 3   TURMERIC PO Take 500 mg by mouth daily.     vitamin C (ASCORBIC ACID) 500 MG tablet Take 1,000 mg by mouth daily. gummy     No current facility-administered medications on file prior to visit.    Review of Systems:  As per HPI- otherwise negative.   Physical Examination: Vitals:   06/24/22 0819  BP: 130/70  Pulse: 67  Resp: 18  Temp: 97.8 F (36.6 C)  SpO2: 99%   Vitals:   06/24/22 0819  Weight: 205 lb 9.6 oz (93.3 kg)  Height: '5\' 5"'$  (1.651 m)   Body mass index is 34.21 kg/m. Ideal Body Weight: Weight in (lb) to have BMI = 25: 149.9  GEN: no acute distress.  Obese, looks well  HEENT: Atraumatic, Normocephalic.  Ears and Nose: No external deformity. CV: RRR, No M/G/R. No JVD. No thrill. No extra heart sounds. PULM: CTA B, no wheezes, crackles, rhonchi. No retractions. No resp. distress. No accessory muscle use EXTR: No c/c- chronic B lower extremity edema is present  PSYCH: Normally interactive. Conversant.  Palpable pulse in right foot Hyperpigmented macule medial to tibia, 2/3 of the way down her shin.  This is the area she hit. The tibia is also mildly tender at this level  Hyperpigmented area approx 1in x 3in in size   Assessment and Plan: Peripheral edema - Plan: Basic metabolic panel  Controlled type 2 diabetes mellitus without complication, without long-term current use of insulin ([place]) - Plan: metFORMIN (GLUCOPHAGE) 1000 MG tablet  Contusion of right lower  leg, initial encounter - Plan: DG Tibia/Fibula  Right  Fall in home, initial encounter - Plan: DG Tibia/Fibula Right  Following up on edema- pt feels that she is doing better, weight is trending down/ She is seeing cardiology this week Follow-up on her renal function today Recent A1c shows good control of DM- refill metformin X-rays to rule out fracture from recent fall  Signed [name], MD  Received labs- message to pt  Results for orders placed or performed in visit on 42/70/62  Basic metabolic panel  Result Value Ref Range   Sodium 140 135 - 145 mEq/L   Potassium 3.6 3.5 - 5.1 mEq/L   Chloride 100 96 - 112 mEq/L   CO2 30 19 - 32 mEq/L   Glucose, Bld 99 70 - 99 mg/dL   BUN 21 6 - 23 mg/dL   Creatinine, Ser 1.45 (H) 0.40 - 1.20 mg/dL   GFR 36.56 (L) >60.00 mL/min   Calcium 9.3 8.4 - 10.5 mg/dL

## 2022-06-23 NOTE — Patient Instructions (Incomplete)
It was good to see you again today, recommend the latest COVID-19 booster and Shingrix series at your pharmacy  Please go to lab and then to the ground floor to have an x-ray of your shin- I will be in touch with your results asap  I hope your swelling continues to be improved

## 2022-06-24 ENCOUNTER — Encounter: Payer: Self-pay | Admitting: Family Medicine

## 2022-06-24 ENCOUNTER — Other Ambulatory Visit: Payer: Medicare Other

## 2022-06-24 ENCOUNTER — Ambulatory Visit (HOSPITAL_BASED_OUTPATIENT_CLINIC_OR_DEPARTMENT_OTHER)
Admission: RE | Admit: 2022-06-24 | Discharge: 2022-06-24 | Disposition: A | Discharge status: Home / Self Care | Payer: Medicare Other | Source: Ambulatory Visit | Attending: Family Medicine | Admitting: Family Medicine

## 2022-06-24 ENCOUNTER — Ambulatory Visit (INDEPENDENT_AMBULATORY_CARE_PROVIDER_SITE_OTHER): Payer: Medicare Other | Admitting: Family Medicine

## 2022-06-24 VITALS — BP 130/70 | HR 67 | Temp 97.8°F | Resp 18 | Ht 65.0 in | Wt 205.6 lb

## 2022-06-24 DIAGNOSIS — W19XXXA Unspecified fall, initial encounter: Secondary | ICD-10-CM | POA: Insufficient documentation

## 2022-06-24 DIAGNOSIS — X58XXXA Exposure to other specified factors, initial encounter: Secondary | ICD-10-CM | POA: Insufficient documentation

## 2022-06-24 DIAGNOSIS — R609 Edema, unspecified: Secondary | ICD-10-CM | POA: Diagnosis not present

## 2022-06-24 DIAGNOSIS — S8011XA Contusion of right lower leg, initial encounter: Secondary | ICD-10-CM | POA: Insufficient documentation

## 2022-06-24 DIAGNOSIS — Y92009 Unspecified place in unspecified non-institutional (private) residence as the place of occurrence of the external cause: Secondary | ICD-10-CM

## 2022-06-24 DIAGNOSIS — Z043 Encounter for examination and observation following other accident: Secondary | ICD-10-CM | POA: Diagnosis not present

## 2022-06-24 DIAGNOSIS — E119 Type 2 diabetes mellitus without complications: Secondary | ICD-10-CM

## 2022-06-24 DIAGNOSIS — M7989 Other specified soft tissue disorders: Secondary | ICD-10-CM | POA: Diagnosis not present

## 2022-06-24 LAB — BASIC METABOLIC PANEL
BUN: 21 mg/dL (ref 6–23)
CO2: 30 mEq/L (ref 19–32)
Calcium: 9.3 mg/dL (ref 8.4–10.5)
Chloride: 100 mEq/L (ref 96–112)
Creatinine, Ser: 1.45 mg/dL — ABNORMAL HIGH (ref 0.40–1.20)
GFR: 36.56 mL/min — ABNORMAL LOW (ref >60.00)
Glucose, Bld: 99 mg/dL (ref 70–99)
Potassium: 3.6 mEq/L (ref 3.5–5.1)
Sodium: 140 mEq/L (ref 135–145)

## 2022-06-24 MED ORDER — METFORMIN HCL 1000 MG PO TABS: 1000.0000 mg | ORAL_TABLET | Freq: Two times a day (BID) | ORAL | 3 refills | Status: DC

## 2022-06-25 ENCOUNTER — Encounter: Payer: Self-pay | Admitting: Family Medicine

## 2022-06-26 ENCOUNTER — Ambulatory Visit: Payer: Medicare Other | Admitting: Cardiology

## 2022-07-06 DIAGNOSIS — N1831 Chronic kidney disease, stage 3a: Secondary | ICD-10-CM | POA: Diagnosis not present

## 2022-07-06 DIAGNOSIS — E0500 Thyrotoxicosis with diffuse goiter without thyrotoxic crisis or storm: Secondary | ICD-10-CM | POA: Diagnosis not present

## 2022-07-06 DIAGNOSIS — E1122 Type 2 diabetes mellitus with diabetic chronic kidney disease: Secondary | ICD-10-CM | POA: Diagnosis not present

## 2022-07-16 DIAGNOSIS — M25511 Pain in right shoulder: Secondary | ICD-10-CM | POA: Diagnosis not present

## 2022-07-17 ENCOUNTER — Other Ambulatory Visit
Admission: RE | Admit: 2022-07-17 | Discharge: 2022-07-17 | Disposition: A | Discharge status: Home / Self Care | Payer: Medicare Other | Source: Ambulatory Visit | Attending: Ophthalmology | Admitting: Ophthalmology

## 2022-07-17 DIAGNOSIS — H2000 Unspecified acute and subacute iridocyclitis: Secondary | ICD-10-CM | POA: Diagnosis not present

## 2022-07-17 DIAGNOSIS — E113211 Type 2 diabetes mellitus with mild nonproliferative diabetic retinopathy with macular edema, right eye: Secondary | ICD-10-CM | POA: Diagnosis not present

## 2022-07-17 LAB — CBC
HCT: 29.9 % — ABNORMAL LOW (ref 36.0–46.0)
Hemoglobin: 9.5 g/dL — ABNORMAL LOW (ref 12.0–15.0)
MCH: 28.8 pg (ref 26.0–34.0)
MCHC: 31.8 g/dL (ref 30.0–36.0)
MCV: 90.6 fL (ref 80.0–100.0)
Platelets: 318 10*3/uL (ref 150–400)
RBC: 3.3 MIL/uL — ABNORMAL LOW (ref 3.87–5.11)
RDW: 13.3 % (ref 11.5–15.5)
WBC: 5.9 10*3/uL (ref 4.0–10.5)
nRBC: 0 % (ref 0.0–0.2)

## 2022-07-17 LAB — SEDIMENTATION RATE: Sed Rate: 2 mm/hr (ref 0–30)

## 2022-07-17 LAB — ACETAMINOPHEN LEVEL: Acetaminophen (Tylenol), Serum: <10 ug/mL — ABNORMAL LOW (ref 10–30)

## 2022-07-18 LAB — ANGIOTENSIN CONVERTING ENZYME: Angiotensin-Converting Enzyme: 41 U/L (ref 14–82)

## 2022-07-18 LAB — ANA: Anti Nuclear Antibody (ANA): NEGATIVE

## 2022-07-18 LAB — RPR: RPR Ser Ql: NONREACTIVE

## 2022-07-20 LAB — QUANTIFERON-TB GOLD PLUS (RQFGPL)
QuantiFERON Mitogen Value: 7.75 IU/mL
QuantiFERON Nil Value: 0 IU/mL
QuantiFERON TB1 Ag Value: 0.04 IU/mL
QuantiFERON TB2 Ag Value: 0.01 IU/mL

## 2022-07-20 LAB — QUANTIFERON-TB GOLD PLUS: QuantiFERON-TB Gold Plus: NEGATIVE

## 2022-07-21 DIAGNOSIS — M25511 Pain in right shoulder: Secondary | ICD-10-CM | POA: Diagnosis not present

## 2022-07-22 LAB — HLA-B27 ANTIGEN: HLA-B27: NEGATIVE

## 2022-07-23 ENCOUNTER — Other Ambulatory Visit: Payer: Self-pay | Admitting: Cardiology

## 2022-07-24 DIAGNOSIS — M75120 Complete rotator cuff tear or rupture of unspecified shoulder, not specified as traumatic: Secondary | ICD-10-CM | POA: Insufficient documentation

## 2022-07-28 DIAGNOSIS — M75121 Complete rotator cuff tear or rupture of right shoulder, not specified as traumatic: Secondary | ICD-10-CM | POA: Diagnosis not present

## 2022-07-29 ENCOUNTER — Ambulatory Visit: Payer: Medicare Other | Admitting: Cardiology

## 2022-07-30 ENCOUNTER — Ambulatory Visit: Payer: Medicare Other | Attending: Cardiology | Admitting: Cardiology

## 2022-07-30 ENCOUNTER — Encounter: Payer: Self-pay | Admitting: Cardiology

## 2022-07-30 VITALS — BP 122/62 | HR 72 | Ht 65.0 in | Wt 203.0 lb

## 2022-07-30 DIAGNOSIS — I872 Venous insufficiency (chronic) (peripheral): Secondary | ICD-10-CM

## 2022-07-30 DIAGNOSIS — I10 Essential (primary) hypertension: Secondary | ICD-10-CM | POA: Diagnosis not present

## 2022-07-30 DIAGNOSIS — I2510 Atherosclerotic heart disease of native coronary artery without angina pectoris: Secondary | ICD-10-CM

## 2022-07-30 DIAGNOSIS — I119 Hypertensive heart disease without heart failure: Secondary | ICD-10-CM | POA: Diagnosis not present

## 2022-07-30 NOTE — Progress Notes (Signed)
Cardiology Office Note:    Date:  07/31/2022   ID:  Emily Wood, DOB Dec 28, 1950, MRN [id number]  PCP:  Darreld Mclean, MD  Cardiologist:  Berniece Salines, DO  Electrophysiologist:  None   Referring MD: Darreld Mclean, MD   "I am having some chest pain"  History of Present Illness:    Emily Wood is a 72 y.o. female with a hx of coronary artery disease seen on her coronary CTA, mild to moderate tricuspid regurgitation, pulmonary hypertension, chronic kidney disease, Graves' disease, hyperlipidemia, type 2 diabetes.   I saw the patient on February 19, 2021 at that time she was experiencing chest discomfort as well as shortness of breath.  Given her risk factors I recommended patient undergo a coronary CTA as well as an echocardiogram.   I saw the patient on May 15, 2021 at that time we discussed her testing results.  I increased her atorvastatin to 40 mg daily.  Continue aspirin 81 mg daily.    I saw the patient on November 04, 2021 at that time we talked about getting a sleep study and repeating her echocardiogram for her tricuspid regurgitation.  I also started the patient on cautious Lasix.  We reviewed all of her lipid profile she was at goal.  At her last visit I started the patient on Imdur due to chest pain. No more recurrent chest pain.    Past Medical History:  Diagnosis Date   Arthritis    Diabetes mellitus without complication ([place])    Family history of adverse reaction to anesthesia    PONV   Graves disease 04/14/2020   Hypertension    Mixed hyperlipidemia 02/20/2021    Past Surgical History:  Procedure Laterality Date   CATARACT EXTRACTION W/PHACO Right 04/14/2022   Procedure: CATARACT EXTRACTION PHACO AND INTRAOCULAR LENS PLACEMENT (IOC) RIGHT DIABETIC 10.93 00:53.0;  Surgeon: Birder Robson, MD;  Location: [place];  Service: Ophthalmology;  Laterality: Right;   CATARACT EXTRACTION W/PHACO Left 04/28/2022   Procedure: CATARACT  EXTRACTION PHACO AND INTRAOCULAR LENS PLACEMENT (IOC) LEFT DIABETIC 6.05 00:45.7;  Surgeon: Birder Robson, MD;  Location: [place] [facility];  Service: Ophthalmology;  Laterality: Left;  Diabetic   CHOLECYSTECTOMY     HERNIA REPAIR     OTHER SURGICAL HISTORY     scar tissue removal from bowels   PARTIAL HYSTERECTOMY     Still has ovaries   UTERINE FIBROID SURGERY      Current Medications: Current Meds  Medication Sig   Accu-Chek Softclix Lancets lancets Check blood sugars once daily   Alpha-Lipoic Acid 300 MG CAPS Take 2 capsules by mouth daily.   amLODipine (NORVASC) 5 MG tablet TAKE 1 AND 1/2 TABLETS BY MOUTH  DAILY (Patient taking differently: Take 7.5 mg by mouth daily.)   aspirin 81 MG tablet Take 81 mg by mouth daily.   atorvastatin (LIPITOR) 40 MG tablet TAKE 1 TABLET BY MOUTH DAILY   Biotin w/ Vitamins C & E (HAIR/SKIN/NAILS PO) Take 6,000 mcg by mouth daily. 2 tabs daily   carvedilol (COREG) 12.5 MG tablet TAKE 1 TABLET BY MOUTH TWICE A DAY   Cholecalciferol (VITAMIN D3) 125 MCG (5000 UT) CAPS Take 1 capsule (5,000 Units total) by mouth daily.   cloNIDine (CATAPRES) 0.2 MG tablet TAKE 1 TABLET BY MOUTH 3 TIMES  DAILY (Patient taking differently: Take 0.2 mg by mouth 2 (two) times daily.)   Cobalamin Combinations (B-12) [phone] MCG SUBL B-12 Plus 5,000 mcg-100 mcg sublingual  tablet   diclofenac Sodium (VOLTAREN) 1 % GEL Apply 4 g topically 4 (four) times daily. (Patient taking differently: Apply 4 g topically daily as needed.)   ferrous sulfate 325 (65 FE) MG tablet Take 325 mg by mouth daily with breakfast.   gabapentin (NEURONTIN) 400 MG capsule TAKE 2 CAPSULES BY MOUTH 3 TIMES DAILY (Patient taking differently: Take 800 mg by mouth 3 (three) times daily.)   gatifloxacin (ZYMAXID) 0.5 % SOLN Place 1 drop into both eyes 4 (four) times daily.   GEMTESA 75 MG TABS Take 75 mg by mouth daily.   glucose blood (ACCU-CHEK GUIDE) test strip Check blood sugars once daily    hydrochlorothiazide (HYDRODIURIL) 25 MG tablet TAKE 1 TABLET BY MOUTH DAILY   irbesartan (AVAPRO) 300 MG tablet Take 1 tablet (300 mg total) by mouth daily.   isosorbide mononitrate (IMDUR) 30 MG 24 hr tablet Take 1 tablet (30 mg total) by mouth daily.   metFORMIN (GLUCOPHAGE) 1000 MG tablet Take 1 tablet (1,000 mg total) by mouth 2 (two) times daily with a meal.   methimazole (TAPAZOLE) 5 MG tablet Take 2.5 mg by mouth daily.   methocarbamol (ROBAXIN) 500 MG tablet Take 1 tablet (500 mg total) by mouth every 8 (eight) hours as needed for muscle spasms.   Multiple Vitamin (MULTIVITAMIN ADULT PO) Take 1 tablet by mouth daily. 2 gummy daily centrum over 50   nystatin cream (MYCOSTATIN) Apply 1 application topically 2 (two) times daily. (Patient taking differently: Apply 1 application  topically 2 (two) times daily as needed.)   OZEMPIC, 0.25 OR 0.5 MG/DOSE, 2 MG/3ML SOPN Inject 0.5 mg as directed once a week.   potassium chloride SA (KLOR-CON M) 20 MEQ tablet TAKE 1 TABLET BY MOUTH DAILY   prednisoLONE acetate (PRED FORTE) 1 % ophthalmic suspension Place 1 drop into both eyes 4 (four) times daily.   torsemide (DEMADEX) 10 MG tablet Take 1 tablet (10 mg total) by mouth daily. Use as needed for lower extremity swelling   TURMERIC PO Take 500 mg by mouth daily.   vitamin C (ASCORBIC ACID) 500 MG tablet Take 1,000 mg by mouth daily. gummy     Allergies:   Patient has no known allergies.   Social History   Socioeconomic History   Marital status: Married    Spouse name: [name]   Number of children: 0   Years of education: 16   Highest education level: Not on file  Occupational History   Occupation: customer service  Tobacco Use   Smoking status: Never   Smokeless tobacco: Never  Vaping Use   Vaping Use: Never used  Substance and Sexual Activity   Alcohol use: No    Alcohol/week: 0.0 standard drinks of alcohol    Comment: rarely   Drug use: No   Sexual activity: Not Currently    Birth  control/protection: Post-menopausal  Other Topics Concern   Not on file  Social History Narrative   Lives with husband   Caffeine use: 16oz coffee per day   Social Determinants of Health   Financial Resource Strain: Medium Risk (06/06/2021)   Overall Financial Resource Strain (CARDIA)    Difficulty of Paying Living Expenses: Somewhat hard  Food Insecurity: No Food Insecurity (05/22/2022)   Hunger Vital Sign    Worried About Running Out of Food in the Last Year: Never true    Ran Out of Food in the Last Year: Never true  Transportation Needs: No Transportation Needs (05/22/2022)   [place] -  Hydrologist (Medical): No    Lack of Transportation (Non-Medical): No  Physical Activity: Inactive (06/06/2021)   Exercise Vital Sign    Days of Exercise per Week: 0 days    Minutes of Exercise per Session: 0 min  Stress: No Stress Concern Present (06/06/2021)   [place]    Feeling of Stress : Not at all  Social Connections: Not on file     Family History: The patient's family history includes Breast cancer in her cousin; Cancer in her sister; Diabetes in her maternal aunt and maternal uncle; Hyperlipidemia in her maternal aunt and mother.  ROS:   Review of Systems  Constitution: Negative for decreased appetite, fever and weight gain.  HENT: Negative for congestion, ear discharge, hoarse voice and sore throat.   Eyes: Negative for discharge, redness, vision loss in right eye and visual halos.  Cardiovascular: Negative for chest pain, dyspnea on exertion, leg swelling, orthopnea and palpitations.  Respiratory: Negative for cough, hemoptysis, shortness of breath and snoring.   Endocrine: Negative for heat intolerance and polyphagia.  Hematologic/Lymphatic: Negative for bleeding problem. Does not bruise/bleed easily.  Skin: Negative for flushing, nail changes, rash and suspicious lesions.   Musculoskeletal: Negative for arthritis, joint pain, muscle cramps, myalgias, neck pain and stiffness.  Gastrointestinal: Negative for abdominal pain, bowel incontinence, diarrhea and excessive appetite.  Genitourinary: Negative for decreased libido, genital sores and incomplete emptying.  Neurological: Negative for brief paralysis, focal weakness, headaches and loss of balance.  Psychiatric/Behavioral: Negative for altered mental status, depression and suicidal ideas.  Allergic/Immunologic: Negative for HIV exposure and persistent infections.    EKGs/Labs/Other Studies Reviewed:    The following studies were reviewed today:   EKG:  The ekg ordered today demonstrates sinus rhythm, heart rate 62 bpm with evidence suggesting old inferior infarction compared to prior EKG no significant change.  TTE 11/28/2021 IMPRESSIONS     1. Left ventricular ejection fraction, by estimation, is 65 to 70%. The  left ventricle has normal function. The left ventricle has no regional  wall motion abnormalities. Left ventricular diastolic parameters are  consistent with Grade II diastolic  dysfunction (pseudonormalization). Elevated left atrial pressure.   2. Right ventricular systolic function is normal. The right ventricular  size is mildly enlarged. There is moderately elevated pulmonary artery  systolic pressure. The estimated right ventricular systolic pressure is  27.0 mmHg.   3. The mitral valve is normal in structure. Trivial mitral valve  regurgitation. No evidence of mitral stenosis.   4. Tricuspid valve regurgitation is mild to moderate.   5. The aortic valve was not well visualized. Aortic valve regurgitation  is not visualized. Aortic valve sclerosis is present, with no evidence of  aortic valve stenosis.   6. The inferior vena cava is normal in size with <50% respiratory  variability, suggesting right atrial pressure of 8 mmHg.   FINDINGS   Left Ventricle: Left ventricular ejection  fraction, by estimation, is 65  to 70%. The left ventricle has normal function. The left ventricle has no  regional wall motion abnormalities. The left ventricular internal cavity  size was normal in size. There is   no left ventricular hypertrophy. Left ventricular diastolic parameters  are consistent with Grade II diastolic dysfunction (pseudonormalization).  Elevated left atrial pressure.   Right Ventricle: The right ventricular size is mildly enlarged. No  increase in right ventricular wall thickness. Right ventricular systolic  function is normal.  There is moderately elevated pulmonary artery systolic  pressure. The tricuspid regurgitant  velocity is 3.46 m/s, and with an assumed right atrial pressure of 8 mmHg,  the estimated right ventricular systolic pressure is 27.7 mmHg.   Left Atrium: Left atrial size was normal in size.   Right Atrium: Right atrial size was normal in size.   Pericardium: Trivial pericardial effusion is present.   Mitral Valve: The mitral valve is normal in structure. Trivial mitral  valve regurgitation. No evidence of mitral valve stenosis.   Tricuspid Valve: The tricuspid valve is normal in structure. Tricuspid  valve regurgitation is mild to moderate.   Aortic Valve: The aortic valve was not well visualized. Aortic valve  regurgitation is not visualized. Aortic valve sclerosis is present, with  no evidence of aortic valve stenosis.   Pulmonic Valve: The pulmonic valve was not well visualized. Pulmonic valve  regurgitation is not visualized.   Aorta: The aortic root and ascending aorta are structurally normal, with  no evidence of dilitation.   Venous: The inferior vena cava is normal in size with less than 50%  respiratory variability, suggesting right atrial pressure of 8 mmHg.   IAS/Shunts: The interatrial septum was not well visualized.    Medication Adjustments/Labs and Tests Ordered: Current medicines are reviewed at length with the  patient today.  Concerns regarding medicines are outlined above.  Tests Ordered: No orders of the defined types were placed in this encounter.  Medication Changes: No orders of the defined types were placed in this encounter.   CCTA March 03, 2021 FINDINGS: Image quality: Poor quality study due to early trigger of the contrast bolus. The contrast bolus is largely timed for the right ventricular with significant SVC enhancement.   Noise artifact is: Moderate signal to noise artifact due to poor contrast bolus timing.   Coronary Arteries:  Normal coronary origin.  Right dominance.   Left main: The left main is a large caliber vessel with a normal take off from the left coronary cusp that bifurcates to form a left anterior descending artery and a left circumflex artery.   Left anterior descending artery: The proximal LAD contains minimal calcified plaque (<25%). The mid and distal segments appear patent. The first diagonal branch contains minimal calcified plaque (<25%).   Left circumflex artery: The LCX is non-dominant and patent with no evidence of plaque or stenosis. The LCX gives off 3 patent obtuse marginal branches.   Right coronary artery: The RCA is dominant with normal take off from the right coronary cusp. The ostial RCA contains mild mixed density plaque (25-49%). The proximal segment contains minimal calcified plaque (<25%). The mid segment is patent. The distal RCA contains minimal calcified plaque (<25%). The RCA terminates as a PDA and right posterolateral branch without evidence of plaque or stenosis.   Right Atrium: Right atrial size is within normal limits.   Right Ventricle: The right ventricular cavity is within normal limits.   Left Atrium: Left atrial size is normal in size with no left atrial appendage filling defect.   Left Ventricle: The ventricular cavity size is within normal limits. There are no stigmata of prior infarction. There is no  abnormal filling defect.   Pulmonary arteries: Normal in size without proximal filling defect.   Pulmonary veins: Normal pulmonary venous drainage.   Pericardium: Normal thickness with no significant effusion or calcium present.   Cardiac valves: The aortic valve is trileaflet without significant calcification. The mitral valve is normal structure with minimal  annular calcium.   Aorta: Normal caliber with no significant disease.   Extra-cardiac findings: See attached radiology report for non-cardiac structures.   IMPRESSION: 1. Poor quality study due to early trigger of the contrast bolus. The contrast bolus is largely timed for the right ventricular with significant SVC enhancement. Study remains interpretable.   2. Coronary calcium score of 418. This was 93rd percentile for age-, sex, and race-matched controls.   3. Normal coronary origin with right dominance.   4. Mild mixed density plaque (25-49%) in the ostial RCA.   5. Minimal calcified plaque (<25%) in the proximal LAD.   RECOMMENDATIONS: 1. Mild non-obstructive CAD (25-49%) in the ostial RCA. CT FFR will be sent due to ostial location. Consider preventive therapy and risk factor modification.   Eleonore Chiquito, MD   Echo August 2022 FINDINGS   Left Ventricle: Left ventricular ejection fraction, by estimation, is 60  to 65%. The left ventricle has normal function. The left ventricle has no  regional wall motion abnormalities. Definity contrast agent was given IV  to delineate the left ventricular   endocardial borders. The left ventricular internal cavity size was normal  in size. There is no left ventricular hypertrophy. Left ventricular  diastolic parameters are consistent with Grade II diastolic dysfunction  (pseudonormalization). Elevated left  atrial pressure.   Right Ventricle: The right ventricular size is normal. No increase in  right ventricular wall thickness. Right ventricular systolic function is   normal. There is moderately elevated pulmonary artery systolic pressure.  The tricuspid regurgitant velocity is  3.35 m/s, and with an assumed right atrial pressure of 8 mmHg, the  estimated right ventricular systolic pressure is 40.9 mmHg.   Left Atrium: Left atrial size was mildly dilated.   Right Atrium: Right atrial size was normal in size.   Pericardium: There is no evidence of pericardial effusion.   Mitral Valve: The mitral valve is normal in structure. Trivial mitral  valve regurgitation. No evidence of mitral valve stenosis.   Tricuspid Valve: The tricuspid valve is normal in structure. Tricuspid  valve regurgitation is mild to moderate. No evidence of tricuspid  stenosis.   Aortic Valve: The aortic valve is normal in structure. Aortic valve  regurgitation is not visualized. No aortic stenosis is present.   Pulmonic Valve: The pulmonic valve was normal in structure. Pulmonic valve  regurgitation is not visualized. No evidence of pulmonic stenosis.   Aorta: The aortic root is normal in size and structure.   Venous: The inferior vena cava is normal in size with greater than 50%  respiratory variability, suggesting right atrial pressure of 3 mmHg.   IAS/Shunts: No atrial level shunt detected by color flow Doppler.       Recent Labs: 03/06/2022: Magnesium 1.5 05/21/2022: ALT 15 06/01/2022: Pro B Natriuretic peptide (BNP) 228.0 06/24/2022: BUN 21; Creatinine, Ser 1.45; Potassium 3.6; Sodium 140 07/17/2022: Hemoglobin 9.5; Platelets 318  Recent Lipid Panel    Component Value Date/Time   CHOL 128 05/20/2022 1504   CHOL 129 07/02/2021 0944   TRIG 100.0 05/20/2022 1504   HDL 47.10 05/20/2022 1504   HDL 56 07/02/2021 0944   CHOLHDL 3 05/20/2022 1504   VLDL 20.0 05/20/2022 1504   LDLCALC 60 05/20/2022 1504   LDLCALC 59 07/02/2021 0944    Physical Exam:    VS:  BP 122/62   Pulse 72   Ht '5\' 5"'$  (1.651 m)   Wt 92.1 kg   SpO2 95%   BMI 33.78 kg/m  Wt Readings  from Last 3 Encounters:  07/30/22 92.1 kg  06/24/22 93.3 kg  06/16/22 88.9 kg     GEN: Well nourished, well developed in no acute distress HEENT: Normal NECK: No JVD; No carotid bruits LYMPHATICS: No lymphadenopathy CARDIAC: S1S2 noted,RRR, no murmurs, rubs, gallops RESPIRATORY:  Clear to auscultation without rales, wheezing or rhonchi  ABDOMEN: Soft, non-tender, non-distended, +bowel sounds, no guarding. EXTREMITIES: No edema, No cyanosis, no clubbing MUSCULOSKELETAL:  No deformity  SKIN: Warm and dry NEUROLOGIC:  Alert and oriented x 3, non-focal PSYCHIATRIC:  Normal affect, good insight  ASSESSMENT:    1. Essential hypertension   2. Chronic venous insufficiency   3. Hypertensive heart disease without heart failure   4. Coronary artery disease involving native coronary artery of native heart without angina pectoris     PLAN:     Coronary artery disease-no recurrent symptoms since start Imdur.  Continue current regimen.     Blood pressure at target.   Diabetes mellitus-this is being managed by the primary team.   The patient understands the need to lose weight with diet and exercise. We have discussed specific strategies for this.  The patient is in agreement with the above plan. The patient left the office in stable condition.  The patient will follow up in   Medication Adjustments/Labs and Tests Ordered: Current medicines are reviewed at length with the patient today.  Concerns regarding medicines are outlined above.  No orders of the defined types were placed in this encounter.  No orders of the defined types were placed in this encounter.   Patient Instructions  Medication Instructions:  Your physician recommends that you continue on your current medications as directed. Please refer to the Current Medication list given to you today.  *If you need a refill on your cardiac medications before your next appointment, please call your pharmacy*   Lab  Work: None   Testing/Procedures: None   Follow-Up: At [facility], you and your health needs are our priority.  As part of our continuing mission to provide you with exceptional heart care, we have created designated Provider Care Teams.  These Care Teams include your primary Cardiologist (physician) and Advanced Practice Providers (APPs -  Physician Assistants and Nurse Practitioners) who all work together to provide you with the care you need, when you need it.  We recommend signing up for the patient portal called "MyChart".  Sign up information is provided on this After Visit Summary.  MyChart is used to connect with patients for Virtual Visits (Telemedicine).  Patients are able to view lab/test results, encounter notes, upcoming appointments, etc.  Non-urgent messages can be sent to your provider as well.   To learn more about what you can do with MyChart, go to [email or web address].    Your next appointment:   9 month(s)  Provider:   Berniece Salines, DO     Other Instructions     Adopting a Healthy Lifestyle.  Know what a healthy weight is for you (roughly BMI <25) and aim to maintain this   Aim for 7+ servings of fruits and vegetables daily   65-80+ fluid ounces of water or unsweet tea for healthy kidneys   Limit to max 1 drink of alcohol per day; avoid smoking/tobacco   Limit animal fats in diet for cholesterol and heart health - choose grass fed whenever available   Avoid highly processed foods, and foods high in saturated/trans fats   Aim for low stress - take  time to unwind and care for your mental health   Aim for 150 min of moderate intensity exercise weekly for heart health, and weights twice weekly for bone health   Aim for 7-9 hours of sleep daily   When it comes to diets, agreement about the perfect plan isnt easy to find, even among the experts. Experts at the [employer] developed an idea known as the Healthy Eating Plate.  Just imagine a plate divided into logical, healthy portions.   The emphasis is on diet quality:   Load up on vegetables and fruits - one-half of your plate: Aim for color and variety, and remember that potatoes dont count.   Go for whole grains - one-quarter of your plate: Whole wheat, barley, wheat berries, quinoa, oats, brown rice, and foods made with them. If you want pasta, go with whole wheat pasta.   Protein power - one-quarter of your plate: Fish, chicken, beans, and nuts are all healthy, versatile protein sources. Limit red meat.   The diet, however, does go beyond the plate, offering a few other suggestions.   Use healthy plant oils, such as olive, canola, soy, corn, sunflower and peanut. Check the labels, and avoid partially hydrogenated oil, which have unhealthy trans fats.   If youre thirsty, drink water. Coffee and tea are good in moderation, but skip sugary drinks and limit milk and dairy products to one or two daily servings.   The type of carbohydrate in the diet is more important than the amount. Some sources of carbohydrates, such as vegetables, fruits, whole grains, and beans-are healthier than others.   Finally, stay active  Signed, Berniece Salines, DO  07/31/2022 9:45 PM    [facility]

## 2022-07-30 NOTE — Patient Instructions (Signed)
Medication Instructions:  Your physician recommends that you continue on your current medications as directed. Please refer to the Current Medication list given to you today.  *If you need a refill on your cardiac medications before your next appointment, please call your pharmacy*   Lab Work: None   Testing/Procedures: None   Follow-Up: At [facility], you and your health needs are our priority.  As part of our continuing mission to provide you with exceptional heart care, we have created designated Provider Care Teams.  These Care Teams include your primary Cardiologist (physician) and Advanced Practice Providers (APPs -  Physician Assistants and Nurse Practitioners) who all work together to provide you with the care you need, when you need it.  We recommend signing up for the patient portal called "MyChart".  Sign up information is provided on this After Visit Summary.  MyChart is used to connect with patients for Virtual Visits (Telemedicine).  Patients are able to view lab/test results, encounter notes, upcoming appointments, etc.  Non-urgent messages can be sent to your provider as well.   To learn more about what you can do with MyChart, go to [email or web address].    Your next appointment:   9 month(s)  Provider:   Berniece Salines, DO     Other Instructions

## 2022-08-07 DIAGNOSIS — H2000 Unspecified acute and subacute iridocyclitis: Secondary | ICD-10-CM | POA: Diagnosis not present

## 2022-08-07 DIAGNOSIS — E113299 Type 2 diabetes mellitus with mild nonproliferative diabetic retinopathy without macular edema, unspecified eye: Secondary | ICD-10-CM | POA: Diagnosis not present

## 2022-08-07 DIAGNOSIS — E113311 Type 2 diabetes mellitus with moderate nonproliferative diabetic retinopathy with macular edema, right eye: Secondary | ICD-10-CM | POA: Diagnosis not present

## 2022-08-15 DIAGNOSIS — H2000 Unspecified acute and subacute iridocyclitis: Secondary | ICD-10-CM | POA: Diagnosis not present

## 2022-08-17 DIAGNOSIS — H2000 Unspecified acute and subacute iridocyclitis: Secondary | ICD-10-CM | POA: Diagnosis not present

## 2022-08-17 DIAGNOSIS — E113311 Type 2 diabetes mellitus with moderate nonproliferative diabetic retinopathy with macular edema, right eye: Secondary | ICD-10-CM | POA: Diagnosis not present

## 2022-08-19 DIAGNOSIS — H2000 Unspecified acute and subacute iridocyclitis: Secondary | ICD-10-CM | POA: Diagnosis not present

## 2022-08-24 DIAGNOSIS — N1832 Chronic kidney disease, stage 3b: Secondary | ICD-10-CM | POA: Diagnosis not present

## 2022-08-24 DIAGNOSIS — E782 Mixed hyperlipidemia: Secondary | ICD-10-CM | POA: Diagnosis not present

## 2022-08-24 DIAGNOSIS — D631 Anemia in chronic kidney disease: Secondary | ICD-10-CM | POA: Diagnosis not present

## 2022-08-24 DIAGNOSIS — N189 Chronic kidney disease, unspecified: Secondary | ICD-10-CM | POA: Diagnosis not present

## 2022-08-24 DIAGNOSIS — N2581 Secondary hyperparathyroidism of renal origin: Secondary | ICD-10-CM | POA: Diagnosis not present

## 2022-08-24 DIAGNOSIS — N1831 Chronic kidney disease, stage 3a: Secondary | ICD-10-CM | POA: Diagnosis not present

## 2022-08-24 DIAGNOSIS — I129 Hypertensive chronic kidney disease with stage 1 through stage 4 chronic kidney disease, or unspecified chronic kidney disease: Secondary | ICD-10-CM | POA: Diagnosis not present

## 2022-08-24 LAB — IRON,TIBC AND FERRITIN PANEL
%SAT: 16
Ferritin: 134
Iron: 45
TIBC: 287
UIBC: 242

## 2022-08-24 LAB — COMPREHENSIVE METABOLIC PANEL
Albumin: 4.3 (ref 3.5–5.0)
Calcium: 10 (ref 8.7–10.7)
Globulin: 3.2
eGFR: 31

## 2022-08-24 LAB — BASIC METABOLIC PANEL
BUN: 27 — AB (ref 4–21)
CO2: 25 — AB (ref 13–22)
Chloride: 101 (ref 99–108)
Creatinine: 1.8 — AB (ref 0.5–1.1)
Glucose: 84
Potassium: 3.8 mEq/L (ref 3.5–5.1)
Sodium: 144 (ref 137–147)

## 2022-08-24 LAB — CBC AND DIFFERENTIAL
HCT: 30 — AB (ref 36–46)
Hemoglobin: 10.3 — AB (ref 12.0–16.0)
Platelets: 331 10*3/uL (ref 150–400)
WBC: 6.7

## 2022-08-24 LAB — HEPATIC FUNCTION PANEL
ALT: 8 U/L (ref 7–35)
AST: 16 (ref 13–35)
Alkaline Phosphatase: 72 (ref 25–125)
Bilirubin, Total: 0.7

## 2022-08-24 LAB — PROTEIN / CREATININE RATIO, URINE
Albumin, U: 12.6
Creatinine, Urine: 54.2

## 2022-08-24 LAB — CBC: RBC: 3.5 — AB (ref 3.87–5.11)

## 2022-08-25 ENCOUNTER — Ambulatory Visit (INDEPENDENT_AMBULATORY_CARE_PROVIDER_SITE_OTHER): Payer: Medicare Other | Admitting: Nurse Practitioner

## 2022-08-25 ENCOUNTER — Encounter (INDEPENDENT_AMBULATORY_CARE_PROVIDER_SITE_OTHER): Payer: Self-pay | Admitting: Nurse Practitioner

## 2022-08-25 VITALS — BP 140/77 | HR 62 | Ht 65.0 in | Wt 198.0 lb

## 2022-08-25 DIAGNOSIS — I10 Essential (primary) hypertension: Secondary | ICD-10-CM | POA: Diagnosis not present

## 2022-08-25 DIAGNOSIS — I890 Lymphedema, not elsewhere classified: Secondary | ICD-10-CM

## 2022-08-25 DIAGNOSIS — E119 Type 2 diabetes mellitus without complications: Secondary | ICD-10-CM | POA: Diagnosis not present

## 2022-08-31 ENCOUNTER — Other Ambulatory Visit: Payer: Self-pay | Admitting: Family Medicine

## 2022-08-31 DIAGNOSIS — Z1231 Encounter for screening mammogram for malignant neoplasm of breast: Secondary | ICD-10-CM

## 2022-08-31 NOTE — Progress Notes (Signed)
Did not sign consent today as patient has some questions about surgery. Instructed patient to call surgeon's office. Patient verbalized understanding.  COVID Vaccine Completed: yes  Date of COVID positive in last 90 days: no  PCP - [name], MD Cardiologist - Berniece Salines, DO  Endocrine clearance by Lucilla Lame 1/11 on chart  Chest x-ray - 05/21/23 Epic EKG - 05/21/22 Epic Stress Test - yes 4 years ago per pt ECHO - 11/28/21 Epic Cardiac Cath - n/a Pacemaker/ICD device last checked: n/a Spinal Cord Stimulator: n/a  Bowel Prep - no  Sleep Study - yes, negative CPAP -   Fasting Blood Sugar -  Checks Blood Sugar  not checking at home currently  Last dose of GLP1 agonist-  Ozempic GLP1 instructions:  do not take 08/07/22. Last dose 08/01/22 (pt held on 1/12 due to nausea    Last dose of SGLT-2 inhibitors-  N/A SGLT-2 instructions: N/A   Blood Thinner Instructions: Aspirin Instructions: ASA 81, hold 7 days Last Dose:  Activity level: Can go up a flight of stairs and perform activities of daily living without stopping and without symptoms of chest pain or shortness of breath.   Anesthesia review: HTN, CAD, tricuspid valve regurgitation, DM2, CKD, creatinine 2.27, Hgb 9.9  Patient denies shortness of breath, fever, cough and chest pain at PAT appointment  Patient verbalized understanding of instructions that were given to them at the PAT appointment. Patient was also instructed that they will need to review over the PAT instructions again at home before surgery.

## 2022-08-31 NOTE — Patient Instructions (Signed)
SURGICAL WAITING ROOM VISITATION  Patients having surgery or a procedure may have no more than 2 support people in the waiting area - these visitors may rotate.    Children under the age of 55 must have an adult with them who is not the patient.  Due to an increase in RSV and influenza rates and associated hospitalizations, children ages 18 and under may not visit patients in [place].  If the patient needs to stay at the hospital during part of their recovery, the visitor guidelines for inpatient rooms apply. Pre-op nurse will coordinate an appropriate time for 1 support person to accompany patient in pre-op.  This support person may not rotate.    Please refer to the [facility] website for the visitor guidelines for Inpatients (after your surgery is over and you are in a regular room).    Your procedure is scheduled on: 09/11/22   Report to [facility] Main Entrance    Report to admitting at 6:30 AM   Call this number if you have problems the morning of surgery [phone]   Do not eat food :After Midnight.   After Midnight you may have the following liquids until 6:00 AM DAY OF SURGERY  Water Non-Citrus Juices (without pulp, NO RED-Apple, White grape, White cranberry) Black Coffee (NO MILK/CREAM OR CREAMERS, sugar ok)  Clear Tea (NO MILK/CREAM OR CREAMERS, sugar ok) regular and decaf                             Plain Jell-O (NO RED)                                           Fruit ices (not with fruit pulp, NO RED)                                     Popsicles (NO RED)                                                               Sports drinks like Gatorade (NO RED)     The day of surgery:  Drink ONE (1) Pre-Surgery G2 at 6:00 AM the morning of surgery. Drink in one sitting. Do not sip.  This drink was given to you during your hospital  pre-op appointment visit. Nothing else to drink after completing the  Pre-Surgery G2.          If you have  questions, please contact your surgeon's office.   FOLLOW BOWEL PREP AND ANY ADDITIONAL PRE OP INSTRUCTIONS YOU RECEIVED FROM YOUR SURGEON'S OFFICE!!!     Oral Hygiene is also important to reduce your risk of infection.                                    Remember - BRUSH YOUR TEETH THE MORNING OF SURGERY WITH YOUR REGULAR TOOTHPASTE  DENTURES WILL BE REMOVED PRIOR TO SURGERY PLEASE DO NOT APPLY "Poly grip" OR ADHESIVES!!!  Do NOT smoke after Midnight   Take these medicines the morning of surgery with A SIP OF WATER: Amlodipine, Atorvastatin, Carvedilol, Clonidine, Gabapentin, Isosorbide, Tapazole, Eye drops   DO NOT TAKE ANY ORAL DIABETIC MEDICATIONS DAY OF YOUR SURGERY  How to Manage Your Diabetes Before and After Surgery  Why is it important to control my blood sugar before and after surgery? Improving blood sugar levels before and after surgery helps healing and can limit problems. A way of improving blood sugar control is eating a healthy diet by:  Eating less sugar and carbohydrates  Increasing activity/exercise  Talking with your doctor about reaching your blood sugar goals High blood sugars (greater than 180 mg/dL) can raise your risk of infections and slow your recovery, so you will need to focus on controlling your diabetes during the weeks before surgery. Make sure that the doctor who takes care of your diabetes knows about your planned surgery including the date and location.  How do I manage my blood sugar before surgery? Check your blood sugar at least 4 times a day, starting 2 days before surgery, to make sure that the level is not too high or low. Check your blood sugar the morning of your surgery when you wake up and every 2 hours until you get to the Short Stay unit. If your blood sugar is less than 70 mg/dL, you will need to treat for low blood sugar: Do not take insulin. Treat a low blood sugar (less than 70 mg/dL) with  cup of clear juice (cranberry or apple),  4 glucose tablets, OR glucose gel. Recheck blood sugar in 15 minutes after treatment (to make sure it is greater than 70 mg/dL). If your blood sugar is not greater than 70 mg/dL on recheck, call [phone] for further instructions. Report your blood sugar to the short stay nurse when you get to Short Stay.  If you are admitted to the hospital after surgery: Your blood sugar will be checked by the staff and you will probably be given insulin after surgery (instead of oral diabetes medicines) to make sure you have good blood sugar levels. The goal for blood sugar control after surgery is 80-180 mg/dL.   WHAT DO I DO ABOUT MY DIABETES MEDICATION?  Do not take oral diabetes medicines (pills) the morning of surgery.  Do not take Ozempic for 7 days before surgery. Last dose  THE DAY BEFORE SURGERY, take  Metformin as prescribed.     THE MORNING OF SURGERY, do not take Metformin.  DO NOT TAKE THE FOLLOWING 7 DAYS PRIOR TO SURGERY: Ozempic, Wegovy, Rybelsus (Semaglutide), Byetta (exenatide), Bydureon (exenatide ER), Victoza, Saxenda (liraglutide), or Trulicity (dulaglutide) Mounjaro (Tirzepatide) Adlyxin (Lixisenatide), Polyethylene Glycol Loxenatide.  Reviewed and Endorsed by [facility] Patient Education Committee, August 2015  Bring CPAP mask and tubing day of surgery.                              You may not have any metal on your body including hair pins, jewelry, and body piercing             Do not wear make-up, lotions, powders, perfumes, or deodorant  Do not wear nail polish including gel and S&S, artificial/acrylic nails, or any other type of covering on natural nails including finger and toenails. If you have artificial nails, gel coating, etc. that needs to be removed by a nail salon please have this removed prior to  surgery or surgery may need to be canceled/ delayed if the surgeon/ anesthesia feels like they are unable to be safely monitored.   Do not shave  48 hours prior to  surgery.    Do not bring valuables to the hospital. [place].   Contacts, glasses, dentures or bridgework may not be worn into surgery.  DO NOT [place]. PHARMACY WILL DISPENSE MEDICATIONS LISTED ON YOUR MEDICATION LIST TO YOU DURING YOUR ADMISSION [place]!    Patients discharged on the day of surgery will not be allowed to drive home.  Someone NEEDS to stay with you for the first 24 hours after anesthesia.   Special Instructions: Bring a copy of your healthcare power of attorney and living will documents the day of surgery if you haven't scanned them before.              Please read over the following fact sheets you were given: IF [place] [phone] Wood    If you received a COVID test during your pre-op visit  it is requested that you wear a mask when out in public, stay away from anyone that may not be feeling well and notify your surgeon if you develop symptoms. If you test positive for Covid or have been in contact with anyone that has tested positive in the last 10 days please notify you surgeon.    [facility] - Preparing for Surgery Before surgery, you can play an important role.  Because skin is not sterile, your skin needs to be as free of germs as possible.  You can reduce the number of germs on your skin by washing with CHG (chlorahexidine gluconate) soap before surgery.  CHG is an antiseptic cleaner which kills germs and bonds with the skin to continue killing germs even after washing. Please DO NOT use if you have an allergy to CHG or antibacterial soaps.  If your skin becomes reddened/irritated stop using the CHG and inform your nurse when you arrive at Short Stay. Do not shave (including legs and underarms) for at least 48 hours prior to the first CHG shower.  You may shave your face/neck.  Please follow these instructions  carefully:  1.  Shower with CHG Soap the night before surgery and the  morning of surgery.  2.  If you choose to wash your hair, wash your hair first as usual with your normal  shampoo.  3.  After you shampoo, rinse your hair and body thoroughly to remove the shampoo.                             4.  Use CHG as you would any other liquid soap.  You can apply chg directly to the skin and wash.  Gently with a scrungie or clean washcloth.  5.  Apply the CHG Soap to your body ONLY FROM THE NECK DOWN.   Do   not use on face/ open                           Wound or open sores. Avoid contact with eyes, ears mouth and   genitals (private parts).  Wash face,  Genitals (private parts) with your normal soap.             6.  Wash thoroughly, paying special attention to the area where your    surgery  will be performed.  7.  Thoroughly rinse your body with warm water from the neck down.  8.  DO NOT shower/wash with your normal soap after using and rinsing off the CHG Soap.                9.  Pat yourself dry with a clean towel.            10.  Wear clean pajamas.            11.  Place clean sheets on your bed the night of your first shower and do not  sleep with pets. Day of Surgery : Do not apply any lotions/deodorants the morning of surgery.  Please wear clean clothes to the hospital/surgery center.  FAILURE TO FOLLOW THESE INSTRUCTIONS MAY RESULT IN THE CANCELLATION OF YOUR SURGERY  PATIENT SIGNATURE_________________________________  NURSE SIGNATURE__________________________________  ________________________________________________________________________  Emily Wood  An incentive spirometer is a tool that can help keep your lungs clear and active. This tool measures how well you are filling your lungs with each breath. Taking long deep breaths may help reverse or decrease the chance of developing breathing (pulmonary) problems (especially infection) following: A long  period of time when you are unable to move or be active. BEFORE THE PROCEDURE  If the spirometer includes an indicator to show your best effort, your nurse or respiratory therapist will set it to a desired goal. If possible, sit up straight or lean slightly forward. Try not to slouch. Hold the incentive spirometer in an upright position. INSTRUCTIONS FOR USE  Sit on the edge of your bed if possible, or sit up as far as you can in bed or on a chair. Hold the incentive spirometer in an upright position. Breathe out normally. Place the mouthpiece in your mouth and seal your lips tightly around it. Breathe in slowly and as deeply as possible, raising the piston or the ball toward the top of the column. Hold your breath for 3-5 seconds or for as long as possible. Allow the piston or ball to fall to the bottom of the column. Remove the mouthpiece from your mouth and breathe out normally. Rest for a few seconds and repeat Steps 1 through 7 at least 10 times every 1-2 hours when you are awake. Take your time and take a few normal breaths between deep breaths. The spirometer may include an indicator to show your best effort. Use the indicator as a goal to work toward during each repetition. After each set of 10 deep breaths, practice coughing to be sure your lungs are clear. If you have an incision (the cut made at the time of surgery), support your incision when coughing by placing a pillow or rolled up towels firmly against it. Once you are able to get out of bed, walk around indoors and cough well. You may stop using the incentive spirometer when instructed by your caregiver.  RISKS AND COMPLICATIONS Take your time so you do not get dizzy or light-headed. If you are in pain, you may need to take or ask for pain medication before doing incentive spirometry. It is harder to take a deep breath if you are having pain. AFTER USE Rest and breathe slowly and easily. It can be helpful to keep  track of a log  of your progress. Your caregiver can provide you with a simple table to help with this. If you are using the spirometer at home, follow these instructions: [place] IF:  You are having difficultly using the spirometer. You have trouble using the spirometer as often as instructed. Your pain medication is not giving enough relief while using the spirometer. You develop fever of 100.5 F (38.1 C) or higher. SEEK IMMEDIATE MEDICAL CARE IF:  You cough up bloody sputum that had not been present before. You develop fever of 102 F (38.9 C) or greater. You develop worsening pain at or near the incision site. MAKE SURE YOU:  Understand these instructions. Will watch your condition. Will get help right away if you are not doing well or get worse. Document Released: 11/16/2006 Document Revised: 09/28/2011 Document Reviewed: 01/17/2007 ExitCare Patient Information 2014 [place].   ________________________________________________________________________ [facility]- Preparing for Total Shoulder Arthroplasty    Before surgery, you can play an important role. Because skin is not sterile, your skin needs to be as free of germs as possible. You can reduce the number of germs on your skin by using the following products. Benzoyl Peroxide Gel Reduces the number of germs present on the skin Applied twice a day to shoulder area starting two days before surgery    ==================================================================  Please follow these instructions carefully:  BENZOYL PEROXIDE 5% GEL  Please do not use if you have an allergy to benzoyl peroxide.   If your skin becomes reddened/irritated stop using the benzoyl peroxide.  Starting two days before surgery, apply as follows: Apply benzoyl peroxide in the morning and at night. Apply after taking a shower. If you are not taking a shower clean entire shoulder front, back, and side along with the armpit with a clean wet  washcloth.  Place a quarter-sized dollop on your shoulder and rub in thoroughly, making sure to cover the front, back, and side of your shoulder, along with the armpit.   2 days before ____ AM   ____ PM              1 day before ____ AM   ____ PM                         Do this twice a day for two days.  (Last application is the night before surgery, AFTER using the CHG soap as described below).  Do NOT apply benzoyl peroxide gel on the day of surgery.

## 2022-09-01 ENCOUNTER — Encounter (HOSPITAL_COMMUNITY)
Admission: RE | Admit: 2022-09-01 | Discharge: 2022-09-01 | Disposition: A | Discharge status: Home / Self Care | Payer: Medicare Other | Source: Ambulatory Visit | Attending: Orthopedic Surgery | Admitting: Orthopedic Surgery

## 2022-09-01 ENCOUNTER — Encounter (HOSPITAL_COMMUNITY): Payer: Self-pay

## 2022-09-01 VITALS — BP 154/72 | HR 72 | Temp 98.4°F | Resp 16 | Ht 65.0 in | Wt 189.0 lb

## 2022-09-01 DIAGNOSIS — Z01818 Encounter for other preprocedural examination: Secondary | ICD-10-CM

## 2022-09-01 DIAGNOSIS — I10 Essential (primary) hypertension: Secondary | ICD-10-CM | POA: Diagnosis not present

## 2022-09-01 DIAGNOSIS — I2510 Atherosclerotic heart disease of native coronary artery without angina pectoris: Secondary | ICD-10-CM | POA: Insufficient documentation

## 2022-09-01 DIAGNOSIS — Z01812 Encounter for preprocedural laboratory examination: Secondary | ICD-10-CM | POA: Diagnosis not present

## 2022-09-01 DIAGNOSIS — Z7985 Long-term (current) use of injectable non-insulin antidiabetic drugs: Secondary | ICD-10-CM | POA: Insufficient documentation

## 2022-09-01 DIAGNOSIS — E0500 Thyrotoxicosis with diffuse goiter without thyrotoxic crisis or storm: Secondary | ICD-10-CM | POA: Insufficient documentation

## 2022-09-01 DIAGNOSIS — E1100 Type 2 diabetes mellitus with hyperosmolarity without nonketotic hyperglycemic-hyperosmolar coma (NKHHC): Secondary | ICD-10-CM | POA: Diagnosis not present

## 2022-09-01 DIAGNOSIS — Z7984 Long term (current) use of oral hypoglycemic drugs: Secondary | ICD-10-CM | POA: Diagnosis not present

## 2022-09-01 DIAGNOSIS — M12811 Other specific arthropathies, not elsewhere classified, right shoulder: Secondary | ICD-10-CM | POA: Diagnosis not present

## 2022-09-01 DIAGNOSIS — E119 Type 2 diabetes mellitus without complications: Secondary | ICD-10-CM | POA: Diagnosis not present

## 2022-09-01 HISTORY — DX: Lymphedema, not elsewhere classified: I89.0

## 2022-09-01 LAB — GLUCOSE, CAPILLARY: Glucose-Capillary: 105 mg/dL — ABNORMAL HIGH (ref 70–99)

## 2022-09-01 LAB — BASIC METABOLIC PANEL
Anion gap: 12 (ref 5–15)
BUN: 36 mg/dL — ABNORMAL HIGH (ref 8–23)
CO2: 28 mmol/L (ref 22–32)
Calcium: 9.6 mg/dL (ref 8.9–10.3)
Chloride: 99 mmol/L (ref 98–111)
Creatinine, Ser: 2.27 mg/dL — ABNORMAL HIGH (ref 0.44–1.00)
GFR, Estimated: 23 mL/min — ABNORMAL LOW (ref >60)
Glucose, Bld: 108 mg/dL — ABNORMAL HIGH (ref 70–99)
Potassium: 3.6 mmol/L (ref 3.5–5.1)
Sodium: 139 mmol/L (ref 135–145)

## 2022-09-01 LAB — SURGICAL PCR SCREEN
MRSA, PCR: NEGATIVE
Staphylococcus aureus: NEGATIVE

## 2022-09-01 LAB — CBC
HCT: 31 % — ABNORMAL LOW (ref 36.0–46.0)
Hemoglobin: 9.9 g/dL — ABNORMAL LOW (ref 12.0–15.0)
MCH: 29.4 pg (ref 26.0–34.0)
MCHC: 31.9 g/dL (ref 30.0–36.0)
MCV: 92 fL (ref 80.0–100.0)
Platelets: 308 10*3/uL (ref 150–400)
RBC: 3.37 MIL/uL — ABNORMAL LOW (ref 3.87–5.11)
RDW: 12.3 % (ref 11.5–15.5)
WBC: 6.6 10*3/uL (ref 4.0–10.5)
nRBC: 0 % (ref 0.0–0.2)

## 2022-09-01 LAB — HEMOGLOBIN A1C
Hgb A1c MFr Bld: 5.6 % (ref 4.8–5.6)
Mean Plasma Glucose: 114.02 mg/dL

## 2022-09-01 NOTE — Progress Notes (Signed)
Hgb 9.9, creatinine 2.27, results routed to Dr. Stann Mainland

## 2022-09-02 ENCOUNTER — Encounter: Payer: Self-pay | Admitting: Family Medicine

## 2022-09-02 ENCOUNTER — Ambulatory Visit
Admission: RE | Admit: 2022-09-02 | Discharge: 2022-09-02 | Disposition: A | Discharge status: Home / Self Care | Payer: Medicare Other | Source: Ambulatory Visit | Attending: Family Medicine | Admitting: Family Medicine

## 2022-09-02 DIAGNOSIS — Z1231 Encounter for screening mammogram for malignant neoplasm of breast: Secondary | ICD-10-CM | POA: Diagnosis not present

## 2022-09-02 NOTE — Progress Notes (Addendum)
Anesthesia Chart Review   Case: [id number] Date/Time: 09/11/22 0810   Procedure: REVERSE SHOULDER ARTHROPLASTY (Right: Shoulder) - 120   Anesthesia type: Choice   Pre-op diagnosis: Right shoulder rotator cuff arthropathy   Location: [facility] ROOM 08 / [facility]   Surgeons: Nicholes Stairs, MD       DISCUSSION:72 y.o. never smoker with h/o HTN, DM II, Graves disease, right shoulder rotator cuff arthropathy scheduled for above procedure 09/11/22 with Dr. Victorino December.   Pt last seen by cardiology 07/30/2022. Per OV note pt stable without sx, no medications changes.   Elevated creatinine at PAT, 09/01/2022 Creatinine 2.27.  This will need to be optimized prior to surgery.  Surgeons office made aware, message sent to PCP.   Addendum 09/10/22:  PCP advised to hold metformin, follow up visit with PCP 09/09/22, repeat BMP with improved creatinine of 1.59.  VS: BP (!) 154/72   Pulse 72   Temp 36.9 C (Oral)   Resp 16   Ht 5' 5"$  (1.651 m)   Wt 85.7 kg   SpO2 100%   BMI 31.45 kg/m   PROVIDERS: Copland, Gay Filler, MD is PCP   Cardiologist:  Berniece Salines, DO   LABS:  forwarded to PCP and surgeon (all labs ordered are listed, but only abnormal results are displayed)  Labs Reviewed  BASIC METABOLIC PANEL - Abnormal; Notable for the following components:      Result Value   Glucose, Bld 108 (*)    BUN 36 (*)    Creatinine, Ser 2.27 (*)    GFR, Estimated 23 (*)    All other components within normal limits  CBC - Abnormal; Notable for the following components:   RBC 3.37 (*)    Hemoglobin 9.9 (*)    HCT 31.0 (*)    All other components within normal limits  GLUCOSE, CAPILLARY - Abnormal; Notable for the following components:   Glucose-Capillary 105 (*)    All other components within normal limits  SURGICAL PCR SCREEN  HEMOGLOBIN A1C     IMAGES:   EKG:   CV: Echo 11/28/2021 1. Left ventricular ejection fraction, by estimation, is 65 to 70%. The  left ventricle has normal  function. The left ventricle has no regional  wall motion abnormalities. Left ventricular diastolic parameters are  consistent with Grade II diastolic  dysfunction (pseudonormalization). Elevated left atrial pressure.   2. Right ventricular systolic function is normal. The right ventricular  size is mildly enlarged. There is moderately elevated pulmonary artery  systolic pressure. The estimated right ventricular systolic pressure is  [id number] mmHg.   3. The mitral valve is normal in structure. Trivial mitral valve  regurgitation. No evidence of mitral stenosis.   4. Tricuspid valve regurgitation is mild to moderate.   5. The aortic valve was not well visualized. Aortic valve regurgitation  is not visualized. Aortic valve sclerosis is present, with no evidence of  aortic valve stenosis.   6. The inferior vena cava is normal in size with <50% respiratory  variability, suggesting right atrial pressure of 8 mmHg.    Past Medical History:  Diagnosis Date   Arthritis    Diabetes mellitus without complication ([place])    Family history of adverse reaction to anesthesia    PONV   Graves disease 04/14/2020   Hypertension    Lymphedema    Mixed hyperlipidemia 02/20/2021    Past Surgical History:  Procedure Laterality Date   CATARACT EXTRACTION W/PHACO Right 04/14/2022   Procedure: CATARACT  EXTRACTION PHACO AND INTRAOCULAR LENS PLACEMENT (IOC) RIGHT DIABETIC 10.93 00:53.0;  Surgeon: Birder Robson, MD;  Location: [place];  Service: Ophthalmology;  Laterality: Right;   CATARACT EXTRACTION W/PHACO Left 04/28/2022   Procedure: CATARACT EXTRACTION PHACO AND INTRAOCULAR LENS PLACEMENT (IOC) LEFT DIABETIC 6.05 00:45.7;  Surgeon: Birder Robson, MD;  Location: [place];  Service: Ophthalmology;  Laterality: Left;  Diabetic   CHOLECYSTECTOMY     HERNIA REPAIR     OTHER SURGICAL HISTORY     scar tissue removal from bowels   PARTIAL HYSTERECTOMY     Still has ovaries    UTERINE FIBROID SURGERY      MEDICATIONS:  Accu-Chek Softclix Lancets lancets   Alpha-Lipoic Acid 300 MG CAPS   amLODipine (NORVASC) 5 MG tablet   Ascorbic Acid (VITAMIN C) 1000 MG tablet   aspirin 81 MG tablet   atorvastatin (LIPITOR) 40 MG tablet   Biotin w/ Vitamins C & E (HAIR/SKIN/NAILS PO)   carvedilol (COREG) 12.5 MG tablet   Cholecalciferol (VITAMIN D3) 125 MCG (5000 UT) CAPS   cloNIDine (CATAPRES) 0.2 MG tablet   Cobalamin Combinations (B-12) [phone] MCG SUBL   diclofenac Sodium (VOLTAREN) 1 % GEL   ferrous sulfate 325 (65 FE) MG tablet   gabapentin (NEURONTIN) 400 MG capsule   gatifloxacin (ZYMAXID) 0.5 % SOLN   GEMTESA 75 MG TABS   glucose blood (ACCU-CHEK GUIDE) test strip   hydrochlorothiazide (HYDRODIURIL) 25 MG tablet   irbesartan (AVAPRO) 300 MG tablet   isosorbide mononitrate (IMDUR) 30 MG 24 hr tablet   metFORMIN (GLUCOPHAGE) 1000 MG tablet   methimazole (TAPAZOLE) 5 MG tablet   methocarbamol (ROBAXIN) 500 MG tablet   Multiple Vitamin (MULTIVITAMIN ADULT PO)   nystatin cream (MYCOSTATIN)   oxybutynin (DITROPAN-XL) 10 MG 24 hr tablet   OZEMPIC, 0.25 OR 0.5 MG/DOSE, 2 MG/3ML SOPN   potassium chloride SA (KLOR-CON M) 20 MEQ tablet   prednisoLONE acetate (PRED FORTE) 1 % ophthalmic suspension   torsemide (DEMADEX) 10 MG tablet   Turmeric 500 MG CAPS   No current facility-administered medications for this encounter.   Konrad Felix Ward, PA-C [facility] Pre-Surgical Testing [phone]

## 2022-09-03 ENCOUNTER — Encounter: Payer: Self-pay | Admitting: Family Medicine

## 2022-09-03 NOTE — Telephone Encounter (Signed)
Received notice that patient had not seen her MyChart message so called her  Hi Emily Wood- I got a message from anesthesia that your kidney function/ creatinine was not as good as normal.   They are concerned about doing your surgery- we need to recheck this and make sure it comes down.  Also, I need you to at least temporarily STOP your metformin as your kidneys need to have a certain level of function to use this medication Would you be able to see me next week and follow-up?    We also have needed a note from her nephrologist from 2/5.  At that time her creatinine is 1.77, GFR 31  Lab work dated 2/13, creatinine 2.27, GFR 23  Called pt-I did return home.  Advised her of the above, she has an appointment to see me on Wednesday.  She will stop her metformin for the time being

## 2022-09-05 ENCOUNTER — Other Ambulatory Visit: Payer: Self-pay | Admitting: Family Medicine

## 2022-09-05 NOTE — Progress Notes (Unsigned)
[place] at [facility] [address], [place], [place] [address] [phone]  Date:  09/09/2022   Name:  Emily Wood   DOB:  [birth year]   MRN:  [id number]  PCP:  Darreld Mclean, MD    Chief Complaint: pre-op concerns (Kidney function were down)   History of Present Illness:  Emily Wood is a 72 y.o. very pleasant female patient who presents with the following:  Pt seen today for pre-operative clearance History of diabetes, chronic kidney disease, hypertension, venous insufficiency and lymphedema, obesity, Graves' hyperthyroidism in remission     Last seen by myself in December  She plans to have shoulder replacement surgery per Dr Stann Mainland  At her recent pre-op her CRI was found to be worse than normal with creat of 2.27- typically she is 1.5- 1.75 I had her hold her metformin and recheck today Per recent nephrology notes it looks like they were considering an SGLT-2 but did not start as of yet  She is using ozempic 0.5 weekly  She did a myoview 2018 EKG 11/23  She had her eye injection on Monday- today is Wednesday  Elane notes she feels fine but she is nervous about upcoming surgery  BP Readings from Last 3 Encounters:  09/09/22 (!) 140/80  09/01/22 (!) 154/72  08/25/22 (!) 140/77     Lab Results  Component Value Date   HGBA1C 5.6 09/01/2022     Patient Active Problem List   Diagnosis Date Noted   Hypokalemia 05/23/2022   Normocytic anemia 05/23/2022   Eye pain, right 05/22/2022   AKI (acute kidney injury) ([place]) 05/21/2022   Coronary artery disease involving native heart 03/06/2022   Tricuspid valve insufficiency 11/05/2021   Hypertension 11/05/2021   Snoring 11/05/2021   Daytime somnolence 11/05/2021   Type 2 diabetes mellitus with hyperosmolarity without coma, without long-term current use of insulin ([place]) 11/05/2021   Coronary artery disease involving native coronary artery of native  heart without angina pectoris 05/15/2021   Nonrheumatic tricuspid valve regurgitation 05/15/2021   Medication management 05/15/2021   Pulmonary hypertension, unspecified ([place]) 05/15/2021   Precordial pain 02/20/2021   Nonspecific abnormal electrocardiogram (ECG) (EKG) 02/20/2021   Mixed hyperlipidemia 02/20/2021   Obesity (BMI 30-39.9) 02/20/2021   Arthritis 02/18/2021   Diabetic retinopathy ([place]) 08/28/2020   Mixed incontinence 05/31/2020   Urinary urgency 05/31/2020   Graves disease 04/14/2020   Right renal mass 11/22/2019   Arthritis of left knee 09/25/2019   Lymphedema 01/25/2017   Fibroid uterus 01/14/2017   Hypertensive heart disease without heart failure 10/01/2016   Chronic kidney disease, stage 3 ([place]) 09/21/2016   Meralgia paresthetica of left side 08/27/2015   Diabetes mellitus type 2, controlled ([place]) 08/02/2015   Chronic venous insufficiency 06/28/2015   Essential hypertension 02/15/2015   Peripheral edema 02/15/2015   Chronic knee pain 02/15/2015    Past Medical History:  Diagnosis Date   Arthritis    Diabetes mellitus without complication ([place])    Family history of adverse reaction to anesthesia    PONV   Graves disease 04/14/2020   Hypertension    Lymphedema    Mixed hyperlipidemia 02/20/2021    Past Surgical History:  Procedure Laterality Date   CATARACT EXTRACTION W/PHACO Right 04/14/2022   Procedure: CATARACT EXTRACTION PHACO AND INTRAOCULAR LENS PLACEMENT (IOC) RIGHT DIABETIC 10.93 00:53.0;  Surgeon: Birder Robson, MD;  Location: [facility];  Service: Ophthalmology;  Laterality: Right;  CATARACT EXTRACTION W/PHACO Left 04/28/2022   Procedure: CATARACT EXTRACTION PHACO AND INTRAOCULAR LENS PLACEMENT (IOC) LEFT DIABETIC 6.05 00:45.7;  Surgeon: Birder Robson, MD;  Location: [facility];  Service: Ophthalmology;  Laterality: Left;  Diabetic   CHOLECYSTECTOMY     HERNIA REPAIR     OTHER SURGICAL HISTORY     scar tissue removal  from bowels   PARTIAL HYSTERECTOMY     Still has ovaries   UTERINE FIBROID SURGERY      Social History   Tobacco Use   Smoking status: Never   Smokeless tobacco: Never  Vaping Use   Vaping Use: Never used  Substance Use Topics   Alcohol use: No    Alcohol/week: 0.0 standard drinks of alcohol    Comment: rarely   Drug use: No    Family History  Problem Relation Age of Onset   Hyperlipidemia Mother    Cancer Sister    Hyperlipidemia Maternal Aunt    Diabetes Maternal Aunt    Breast cancer Cousin    Diabetes Maternal Uncle     No Known Allergies  Medication list has been reviewed and updated.  Current Outpatient Medications on File Prior to Visit  Medication Sig Dispense Refill   Accu-Chek Softclix Lancets lancets Check blood sugars once daily 100 each 12   Alpha-Lipoic Acid 300 MG CAPS Take 600 mg by mouth daily.     amLODipine (NORVASC) 5 MG tablet TAKE 1 AND 1/2 TABLETS BY MOUTH  DAILY 120 tablet 3   Ascorbic Acid (VITAMIN C) 1000 MG tablet Take 1,000 mg by mouth daily.     aspirin 81 MG tablet Take 81 mg by mouth daily.     atorvastatin (LIPITOR) 40 MG tablet TAKE 1 TABLET BY MOUTH DAILY 100 tablet 2   Biotin w/ Vitamins C & E (HAIR/SKIN/NAILS PO) Take 6,000 mcg by mouth daily.     carvedilol (COREG) 12.5 MG tablet TAKE 1 TABLET BY MOUTH TWICE A DAY 180 tablet 2   Cholecalciferol (VITAMIN D3) 125 MCG (5000 UT) CAPS Take 1 capsule (5,000 Units total) by mouth daily.     cloNIDine (CATAPRES) 0.2 MG tablet TAKE 1 TABLET BY MOUTH 3 TIMES  DAILY 240 tablet 3   Cobalamin Combinations (B-12) [phone] MCG SUBL Place 5,000 mcg under the tongue daily.     diclofenac Sodium (VOLTAREN) 1 % GEL Apply 4 g topically 4 (four) times daily. (Patient taking differently: Apply 4 g topically daily as needed.) 100 g 0   ferrous sulfate 325 (65 FE) MG tablet Take 325 mg by mouth daily with breakfast.     gabapentin (NEURONTIN) 400 MG capsule TAKE 2 CAPSULES BY MOUTH 3 TIMES DAILY (Patient  taking differently: Take 800 mg by mouth 3 (three) times daily.) 600 capsule 2   gatifloxacin (ZYMAXID) 0.5 % SOLN Place 1 drop into both eyes 4 (four) times daily.     GEMTESA 75 MG TABS Take 75 mg by mouth daily.     glucose blood (ACCU-CHEK GUIDE) test strip Check blood sugars once daily 100 strip 12   hydrochlorothiazide (HYDRODIURIL) 25 MG tablet TAKE 1 TABLET BY MOUTH DAILY 80 tablet 3   irbesartan (AVAPRO) 300 MG tablet Take 1 tablet (300 mg total) by mouth daily.     isosorbide mononitrate (IMDUR) 30 MG 24 hr tablet Take 1 tablet (30 mg total) by mouth daily. 90 tablet 3   metFORMIN (GLUCOPHAGE) 1000 MG tablet Take 1 tablet (1,000 mg total) by mouth 2 (two)  times daily with a meal. 180 tablet 3   methimazole (TAPAZOLE) 5 MG tablet Take 2.5 mg by mouth daily.     methocarbamol (ROBAXIN) 500 MG tablet Take 1 tablet (500 mg total) by mouth every 8 (eight) hours as needed for muscle spasms. 30 tablet 0   Multiple Vitamin (MULTIVITAMIN ADULT PO) Take 2 tablets by mouth daily.     nystatin cream (MYCOSTATIN) Apply 1 application topically 2 (two) times daily. (Patient taking differently: Apply 1 application  topically 2 (two) times daily as needed for dry skin.) 30 g 1   oxybutynin (DITROPAN-XL) 10 MG 24 hr tablet Take 10 mg by mouth at bedtime.     OZEMPIC, 0.25 OR 0.5 MG/DOSE, 2 MG/3ML SOPN Inject 0.5 mg into the skin once a week.     potassium chloride SA (KLOR-CON M) 20 MEQ tablet TAKE 1 TABLET BY MOUTH DAILY 80 tablet 3   prednisoLONE acetate (PRED FORTE) 1 % ophthalmic suspension Place 1 drop into both eyes 4 (four) times daily. (Patient taking differently: Place 1 drop into both eyes in the morning, at noon, and at bedtime.) 5 mL 0   torsemide (DEMADEX) 10 MG tablet TAKE 1 TABLET (10 MG TOTAL) BY MOUTH DAILY. USE AS NEEDED FOR LOWER EXTREMITY SWELLING 90 tablet 1   Turmeric 500 MG CAPS Take 500 mg by mouth daily.     No current facility-administered medications on file prior to visit.     Review of Systems:  As per HPI- otherwise negative.   Physical Examination: Vitals:   09/09/22 0952 09/09/22 1013  BP: (!) 177/83 (!) 140/80  Pulse: 67   Resp: 16   Temp: 97.9 F (36.6 C)   SpO2: 100%    Vitals:   09/09/22 0952  Weight: 187 lb 9.6 oz (85.1 kg)  Height: 5' 5"$  (1.651 m)   Body mass index is 31.22 kg/m. Ideal Body Weight: Weight in (lb) to have BMI = 25: 149.9  GEN: no acute distress.  Mildly obese, looks well HEENT: Atraumatic, Normocephalic.  Ears and Nose: No external deformity. CV: RRR, No M/G/R. No JVD. No thrill. No extra heart sounds. PULM: CTA B, no wheezes, crackles, rhonchi. No retractions. No resp. distress. No accessory muscle use. ABD: S, NT, ND, +BS. No rebound. No HSM. EXTR: No c/c/chronic bilateral lower extremity edema is present PSYCH: Normally interactive. Conversant.    Assessment and Plan: Controlled type 2 diabetes mellitus without complication, without long-term current use of insulin ([place]) - Plan: Basic metabolic panel  Diabetic retinopathy associated with type 2 diabetes mellitus, macular edema presence unspecified, unspecified laterality, unspecified retinopathy severity (H[facility])  Essential hypertension  Stage 3a chronic kidney disease ([place]) - Plan: Basic metabolic panel  Patient seen today for follow-up.  She was asked to have a reassessment of her renal function prior to shoulder surgery which is planned for Friday Order labs today- will back with her as soon as possible Blood pressure under reasonable control She continues to see her eye doctor regularly for retinopathy-we had to stop metformin due to renal function.  However most recent A1c showed excellent control blood sugars  Signed [name], MD  Received her labs 2/22- message to pt and to ortho Renal function is better but will have her stay off metformin   Results for orders placed or performed in visit on [id number]  Basic metabolic panel  Result Value  Ref Range   Sodium 140 135 - 145 mEq/L   Potassium 3.5 3.5 -  5.1 mEq/L   Chloride 97 96 - 112 mEq/L   CO2 30 19 - 32 mEq/L   Glucose, Bld 102 (H) 70 - 99 mg/dL   BUN 21 6 - 23 mg/dL   Creatinine, Ser 1.59 (H) 0.40 - 1.20 mg/dL   GFR 32.69 (L) >60.00 mL/min   Calcium 10.5 8.4 - 10.5 mg/dL

## 2022-09-07 ENCOUNTER — Encounter (INDEPENDENT_AMBULATORY_CARE_PROVIDER_SITE_OTHER): Payer: Self-pay | Admitting: Nurse Practitioner

## 2022-09-07 DIAGNOSIS — E113311 Type 2 diabetes mellitus with moderate nonproliferative diabetic retinopathy with macular edema, right eye: Secondary | ICD-10-CM | POA: Diagnosis not present

## 2022-09-07 DIAGNOSIS — H2000 Unspecified acute and subacute iridocyclitis: Secondary | ICD-10-CM | POA: Diagnosis not present

## 2022-09-07 NOTE — Progress Notes (Signed)
MRN : [id number]  Emily Wood is a 72 y.o. (1951-07-13) female who presents with chief complaint of follow up for swelling.  History of Present Illness:   The patient returns to the office for followup evaluation regarding leg swelling.  The swelling has persisted but with the lymph pump is controlled. The pain associated with swelling is essentially eliminated . There have not been any interval development of a ulcerations or wounds.   The patient denies problems with the pump, noting it is working well and the leggings are in good condition but she admits she has not been using it as much as she should.   Since the previous visit the patient has been wearing graduated compression stockings and using the lymph pump on a routine basis and  has noted significant improvement in the lymphedema.    Patient stated the lymph pump has been a very positive factor in her care.   Current Meds  Medication Sig   Accu-Chek Softclix Lancets lancets Check blood sugars once daily   Alpha-Lipoic Acid 300 MG CAPS Take 600 mg by mouth daily.   amLODipine (NORVASC) 5 MG tablet TAKE 1 AND 1/2 TABLETS BY MOUTH  DAILY   Ascorbic Acid (VITAMIN C) 1000 MG tablet Take 1,000 mg by mouth daily.   aspirin 81 MG tablet Take 81 mg by mouth daily.   atorvastatin (LIPITOR) 40 MG tablet TAKE 1 TABLET BY MOUTH DAILY   Biotin w/ Vitamins C & E (HAIR/SKIN/NAILS PO) Take 6,000 mcg by mouth daily.   carvedilol (COREG) 12.5 MG tablet TAKE 1 TABLET BY MOUTH TWICE A DAY   Cholecalciferol (VITAMIN D3) 125 MCG (5000 UT) CAPS Take 1 capsule (5,000 Units total) by mouth daily.   cloNIDine (CATAPRES) 0.2 MG tablet TAKE 1 TABLET BY MOUTH 3 TIMES  DAILY   Cobalamin Combinations (B-12) [phone] MCG SUBL Place 5,000 mcg under the tongue daily.   diclofenac Sodium (VOLTAREN) 1 % GEL Apply 4 g topically 4 (four) times daily. (Patient taking differently: Apply 4 g topically daily as needed.)   ferrous sulfate 325 (65 FE) MG  tablet Take 325 mg by mouth daily with breakfast.   gabapentin (NEURONTIN) 400 MG capsule TAKE 2 CAPSULES BY MOUTH 3 TIMES DAILY (Patient taking differently: Take 800 mg by mouth 3 (three) times daily.)   gatifloxacin (ZYMAXID) 0.5 % SOLN Place 1 drop into both eyes 4 (four) times daily. (Patient not taking: Reported on 08/28/2022)   GEMTESA 75 MG TABS Take 75 mg by mouth daily.   glucose blood (ACCU-CHEK GUIDE) test strip Check blood sugars once daily   hydrochlorothiazide (HYDRODIURIL) 25 MG tablet TAKE 1 TABLET BY MOUTH DAILY   irbesartan (AVAPRO) 300 MG tablet Take 1 tablet (300 mg total) by mouth daily.   isosorbide mononitrate (IMDUR) 30 MG 24 hr tablet Take 1 tablet (30 mg total) by mouth daily.   metFORMIN (GLUCOPHAGE) 1000 MG tablet Take 1 tablet (1,000 mg total) by mouth 2 (two) times daily with a meal.   methimazole (TAPAZOLE) 5 MG tablet Take 2.5 mg by mouth daily.   methocarbamol (ROBAXIN) 500 MG tablet Take 1 tablet (500 mg total) by mouth every 8 (eight) hours as needed for muscle spasms. (Patient not taking: Reported on 08/28/2022)   Multiple Vitamin (MULTIVITAMIN ADULT PO) Take 2 tablets by mouth daily.   nystatin cream (MYCOSTATIN) Apply 1 application topically 2 (two) times daily. (Patient taking differently: Apply 1 application  topically 2 (two) times daily as  needed for dry skin.)   OZEMPIC, 0.25 OR 0.5 MG/DOSE, 2 MG/3ML SOPN Inject 0.5 mg into the skin once a week.   potassium chloride SA (KLOR-CON M) 20 MEQ tablet TAKE 1 TABLET BY MOUTH DAILY   prednisoLONE acetate (PRED FORTE) 1 % ophthalmic suspension Place 1 drop into both eyes 4 (four) times daily. (Patient taking differently: Place 1 drop into both eyes in the morning, at noon, and at bedtime.)   torsemide (DEMADEX) 10 MG tablet Take 1 tablet (10 mg total) by mouth daily. Use as needed for lower extremity swelling   Turmeric 500 MG CAPS Take 500 mg by mouth daily.    Past Medical History:  Diagnosis Date   Arthritis     Diabetes mellitus without complication ([place])    Family history of adverse reaction to anesthesia    PONV   Graves disease 04/14/2020   Hypertension    Lymphedema    Mixed hyperlipidemia 02/20/2021    Past Surgical History:  Procedure Laterality Date   CATARACT EXTRACTION W/PHACO Right 04/14/2022   Procedure: CATARACT EXTRACTION PHACO AND INTRAOCULAR LENS PLACEMENT (IOC) RIGHT DIABETIC 10.93 00:53.0;  Surgeon: Birder Robson, MD;  Location: [facility];  Service: Ophthalmology;  Laterality: Right;   CATARACT EXTRACTION W/PHACO Left 04/28/2022   Procedure: CATARACT EXTRACTION PHACO AND INTRAOCULAR LENS PLACEMENT (IOC) LEFT DIABETIC 6.05 00:45.7;  Surgeon: Birder Robson, MD;  Location: [facility];  Service: Ophthalmology;  Laterality: Left;  Diabetic   CHOLECYSTECTOMY     HERNIA REPAIR     OTHER SURGICAL HISTORY     scar tissue removal from bowels   PARTIAL HYSTERECTOMY     Still has ovaries   UTERINE FIBROID SURGERY      Social History Social History   Tobacco Use   Smoking status: Never   Smokeless tobacco: Never  Vaping Use   Vaping Use: Never used  Substance Use Topics   Alcohol use: No    Alcohol/week: 0.0 standard drinks of alcohol    Comment: rarely   Drug use: No    Family History Family History  Problem Relation Age of Onset   Hyperlipidemia Mother    Cancer Sister    Hyperlipidemia Maternal Aunt    Diabetes Maternal Aunt    Breast cancer Cousin    Diabetes Maternal Uncle     No Known Allergies   REVIEW OF SYSTEMS (Negative unless checked)  Constitutional: []$ Weight loss  []$ Fever  []$ Chills Cardiac: []$ Chest pain   []$ Chest pressure   []$ Palpitations   []$ Shortness of breath when laying flat   []$ Shortness of breath with exertion. Vascular:  []$ Pain in legs with walking   []$ Pain in legs at rest  []$ History of DVT   []$ Phlebitis   [x]$ Swelling in legs   []$ Varicose veins   []$ Non-healing ulcers Pulmonary:   []$ Uses home oxygen   []$ Productive  cough   []$ Hemoptysis   []$ Wheeze  []$ COPD   []$ Asthma Neurologic:  []$ Dizziness   []$ Seizures   []$ History of stroke   []$ History of TIA  []$ Aphasia   []$ Vissual changes   []$ Weakness or numbness in arm   []$ Weakness or numbness in leg Musculoskeletal:   []$ Joint swelling   []$ Joint pain   []$ Low back pain Hematologic:  []$ Easy bruising  []$ Easy bleeding   []$ Hypercoagulable state   []$ Anemic Gastrointestinal:  []$ Diarrhea   []$ Vomiting  []$ Gastroesophageal reflux/heartburn   []$ Difficulty swallowing. Genitourinary:  []$ Chronic kidney disease   []$ Difficult urination  []$ Frequent urination   []$ Blood in urine Skin:  []$ Rashes   []$   Ulcers  Psychological:  []$ History of anxiety   []$  History of major depression.  Physical Examination  Vitals:   08/25/22 1014  BP: (!) 140/77  Pulse: 62  Weight: 198 lb (89.8 kg)  Height: 5' 5"$  (1.651 m)   Body mass index is 32.95 kg/m. Gen: WD/WN, NAD Head: [place]/AT, No temporalis wasting.  Ear/Nose/Throat: Hearing grossly intact, nares w/o erythema or drainage, pinna without lesions Eyes: PER, EOMI, sclera nonicteric.  Neck: Supple, no gross masses.  No JVD.  Pulmonary:  Good air movement, no audible wheezing, no use of accessory muscles.  Cardiac: RRR, precordium not hyperdynamic. Vascular:  2+ soft pitting edema  Vessel Right Left  Radial Palpable Palpable  Gastrointestinal: soft, non-distended. No guarding/no peritoneal signs.  Musculoskeletal: M/S 5/5 throughout.  No deformity.  Neurologic: CN 2-12 intact. Pain and light touch intact in extremities.  Symmetrical.  Speech is fluent. Motor exam as listed above. Psychiatric: Judgment intact, Mood & affect appropriate for pt's clinical situation. Dermatologic: Severe venous rashes no ulcers noted.  No changes consistent with cellulitis. Lymph :  skin changes of chronic lymphedema.  CBC Lab Results  Component Value Date   WBC 6.6 09/01/2022   HGB 9.9 (L) 09/01/2022   HCT 31.0 (L) 09/01/2022   MCV 92.0 09/01/2022   PLT 308  09/01/2022    BMET    Component Value Date/Time   NA 139 09/01/2022 0838   NA 144 08/24/2022 0000   K 3.6 09/01/2022 0838   CL 99 09/01/2022 0838   CO2 28 09/01/2022 0838   GLUCOSE 108 (H) 09/01/2022 0838   BUN 36 (H) 09/01/2022 0838   BUN 27 (A) 08/24/2022 0000   CREATININE 2.27 (H) 09/01/2022 0838   CREATININE 1.07 (H) 04/17/2020 1018   CALCIUM 9.6 09/01/2022 0838   GFRNONAA 23 (L) 09/01/2022 0838   Estimated Creatinine Clearance: 25.5 mL/min (A) (by C-G formula based on SCr of 2.27 mg/dL (H)).  COAG No results found for: "INR", "PROTIME"  Radiology MM 3D SCREEN BREAST BILATERAL  Result Date: 09/03/2022 CLINICAL DATA:  Screening. EXAM: DIGITAL SCREENING BILATERAL MAMMOGRAM WITH TOMOSYNTHESIS AND CAD TECHNIQUE: Bilateral screening digital craniocaudal and mediolateral oblique mammograms were obtained. Bilateral screening digital breast tomosynthesis was performed. The images were evaluated with computer-aided detection. COMPARISON:  Previous exam(s). ACR Breast Density Category a: The breasts are almost entirely fatty. FINDINGS: There are no findings suspicious for malignancy. IMPRESSION: No mammographic evidence of malignancy. A result letter of this screening mammogram will be mailed directly to the patient. RECOMMENDATION: Screening mammogram in one year. (Code:SM-B-01Y) BI-RADS CATEGORY  1: Negative. Electronically Signed   By: Ammie Ferrier M.D.   On: 09/03/2022 16:17     Assessment/Plan 1. Lymphedema  No surgery or intervention at this point in time.    I have reviewed my discussion with the patient regarding lymphedema and why it  causes symptoms.  Patient will continue wearing graduated compression stockings class 1 (20-30 mmHg) on a daily basis a prescription was given. The patient is reminded to put the stockings on first thing in the morning and removing them in the evening. The patient is instructed specifically not to sleep in the stockings.   In addition,  behavioral modification throughout the day will be continued.  This will include frequent elevation (such as in a recliner), use of over the counter pain medications as needed and exercise such as walking.  I have reviewed systemic causes for chronic edema such as liver, kidney and cardiac etiologies and  there does not appear to be any significant changes in these organ systems over the past year.  The patient is under the impression that these organ systems are all stable and unchanged.    The patient will continue aggressive use of the  lymph pump.  This will continue to improve the edema control and prevent sequela such as ulcers and infections.   The patient will follow-up with me on an annual basis.      2. Essential hypertension Continue antihypertensive medications as already ordered, these medications have been reviewed and there are no changes at this time.    3. Controlled type 2 diabetes mellitus without complication, without long-term current use of insulin ([place]) Continue hypoglycemic medications as already ordered, these medications have been reviewed and there are no changes at this time.  Hgb A1C to be monitored as already arranged by primary service     Kris Hartmann, NP  09/07/2022 3:23 AM

## 2022-09-09 ENCOUNTER — Ambulatory Visit (INDEPENDENT_AMBULATORY_CARE_PROVIDER_SITE_OTHER): Payer: Medicare Other | Admitting: Family Medicine

## 2022-09-09 ENCOUNTER — Encounter: Payer: Self-pay | Admitting: Family Medicine

## 2022-09-09 VITALS — BP 140/80 | HR 67 | Temp 97.9°F | Resp 16 | Ht 65.0 in | Wt 187.6 lb

## 2022-09-09 DIAGNOSIS — E11319 Type 2 diabetes mellitus with unspecified diabetic retinopathy without macular edema: Secondary | ICD-10-CM | POA: Diagnosis not present

## 2022-09-09 DIAGNOSIS — E119 Type 2 diabetes mellitus without complications: Secondary | ICD-10-CM | POA: Diagnosis not present

## 2022-09-09 DIAGNOSIS — I10 Essential (primary) hypertension: Secondary | ICD-10-CM | POA: Diagnosis not present

## 2022-09-09 DIAGNOSIS — N1831 Chronic kidney disease, stage 3a: Secondary | ICD-10-CM

## 2022-09-09 NOTE — Patient Instructions (Signed)
Continue stay off metformin at least for now- I will be in touch with your labs asap Best of luck with your upcoming surgery!

## 2022-09-10 ENCOUNTER — Encounter: Payer: Self-pay | Admitting: Family Medicine

## 2022-09-10 LAB — BASIC METABOLIC PANEL
BUN: 21 mg/dL (ref 6–23)
CO2: 30 mEq/L (ref 19–32)
Calcium: 10.5 mg/dL (ref 8.4–10.5)
Chloride: 97 mEq/L (ref 96–112)
Creatinine, Ser: 1.59 mg/dL — ABNORMAL HIGH (ref 0.40–1.20)
GFR: 32.69 mL/min — ABNORMAL LOW (ref >60.00)
Glucose, Bld: 102 mg/dL — ABNORMAL HIGH (ref 70–99)
Potassium: 3.5 mEq/L (ref 3.5–5.1)
Sodium: 140 mEq/L (ref 135–145)

## 2022-09-10 NOTE — Anesthesia Preprocedure Evaluation (Addendum)
Anesthesia Evaluation  Patient identified by MRN, date of birth, ID band Patient awake    Reviewed: Allergy & Precautions, NPO status , Patient's Chart, lab work & pertinent test results, reviewed documented beta blocker date and time   Airway Mallampati: III  TM Distance: >3 FB Neck ROM: Full    Dental no notable dental hx. (+) Teeth Intact, Dental Advisory Given   Pulmonary neg pulmonary ROS   Pulmonary exam normal breath sounds clear to auscultation       Cardiovascular hypertension, Pt. on medications and Pt. on home beta blockers + CAD  Normal cardiovascular exam Rhythm:Regular Rate:Normal  TTE 2023  1. Left ventricular ejection fraction, by estimation, is 65 to 70%. The  left ventricle has normal function. The left ventricle has no regional  wall motion abnormalities. Left ventricular diastolic parameters are  consistent with Grade II diastolic  dysfunction (pseudonormalization). Elevated left atrial pressure.   2. Right ventricular systolic function is normal. The right ventricular  size is mildly enlarged. There is moderately elevated pulmonary artery  systolic pressure. The estimated right ventricular systolic pressure is  [id number] mmHg.   3. The mitral valve is normal in structure. Trivial mitral valve  regurgitation. No evidence of mitral stenosis.   4. Tricuspid valve regurgitation is mild to moderate.   5. The aortic valve was not well visualized. Aortic valve regurgitation  is not visualized. Aortic valve sclerosis is present, with no evidence of  aortic valve stenosis.   6. The inferior vena cava is normal in size with <50% respiratory  variability, suggesting right atrial pressure of 8 mmHg.   Stress Test 2018 negative    Neuro/Psych negative neurological ROS  negative psych ROS   GI/Hepatic negative GI ROS, Neg liver ROS,,,  Endo/Other  diabetes, Type 2, Oral Hypoglycemic Agents  Grave's disease   Renal/GU Renal InsufficiencyRenal disease  negative genitourinary   Musculoskeletal  (+) Arthritis ,    Abdominal   Peds  Hematology  (+) Blood dyscrasia, anemia   Anesthesia Other Findings   Reproductive/Obstetrics                             Anesthesia Physical Anesthesia Plan  ASA: 3  Anesthesia Plan: General and Regional   Post-op Pain Management: Regional block* and Tylenol PO (pre-op)*   Induction: Intravenous  PONV Risk Score and Plan: 3 and Midazolam, Dexamethasone and Ondansetron  Airway Management Planned: Oral ETT  Additional Equipment:   Intra-op Plan:   Post-operative Plan: Extubation in OR  Informed Consent: I have reviewed the patients History and Physical, chart, labs and discussed the procedure including the risks, benefits and alternatives for the proposed anesthesia with the patient or authorized representative who has indicated his/her understanding and acceptance.     Dental advisory given  Plan Discussed with: CRNA  Anesthesia Plan Comments: (See PAT note 09/01/22)       Anesthesia Quick Evaluation

## 2022-09-10 NOTE — Addendum Note (Signed)
Addended by: Darreld Mclean on: 09/10/2022 08:34 AM   Modules accepted: Orders

## 2022-09-11 ENCOUNTER — Ambulatory Visit (HOSPITAL_COMMUNITY): Payer: Medicare Other | Admitting: Physician Assistant

## 2022-09-11 ENCOUNTER — Encounter (HOSPITAL_COMMUNITY): Payer: Self-pay | Admitting: Orthopedic Surgery

## 2022-09-11 ENCOUNTER — Ambulatory Visit (HOSPITAL_COMMUNITY)
Admission: RE | Admit: 2022-09-11 | Discharge: 2022-09-11 | Disposition: A | Discharge status: Home / Self Care | Payer: Medicare Other | Source: Ambulatory Visit | Attending: Orthopedic Surgery | Admitting: Orthopedic Surgery

## 2022-09-11 ENCOUNTER — Encounter (HOSPITAL_COMMUNITY)
Admission: RE | Disposition: A | Discharge status: Home / Self Care | Payer: Self-pay | Source: Ambulatory Visit | Attending: Orthopedic Surgery

## 2022-09-11 ENCOUNTER — Ambulatory Visit (HOSPITAL_BASED_OUTPATIENT_CLINIC_OR_DEPARTMENT_OTHER): Payer: Medicare Other | Admitting: Anesthesiology

## 2022-09-11 ENCOUNTER — Ambulatory Visit (HOSPITAL_COMMUNITY): Payer: Medicare Other

## 2022-09-11 ENCOUNTER — Other Ambulatory Visit: Payer: Self-pay

## 2022-09-11 DIAGNOSIS — M12811 Other specific arthropathies, not elsewhere classified, right shoulder: Secondary | ICD-10-CM | POA: Diagnosis not present

## 2022-09-11 DIAGNOSIS — Z79899 Other long term (current) drug therapy: Secondary | ICD-10-CM | POA: Insufficient documentation

## 2022-09-11 DIAGNOSIS — I10 Essential (primary) hypertension: Secondary | ICD-10-CM | POA: Insufficient documentation

## 2022-09-11 DIAGNOSIS — E1100 Type 2 diabetes mellitus with hyperosmolarity without nonketotic hyperglycemic-hyperosmolar coma (NKHHC): Secondary | ICD-10-CM

## 2022-09-11 DIAGNOSIS — D649 Anemia, unspecified: Secondary | ICD-10-CM | POA: Diagnosis not present

## 2022-09-11 DIAGNOSIS — M19011 Primary osteoarthritis, right shoulder: Secondary | ICD-10-CM | POA: Diagnosis not present

## 2022-09-11 DIAGNOSIS — M129 Arthropathy, unspecified: Secondary | ICD-10-CM | POA: Diagnosis not present

## 2022-09-11 DIAGNOSIS — Z7985 Long-term (current) use of injectable non-insulin antidiabetic drugs: Secondary | ICD-10-CM | POA: Diagnosis not present

## 2022-09-11 DIAGNOSIS — I2510 Atherosclerotic heart disease of native coronary artery without angina pectoris: Secondary | ICD-10-CM | POA: Diagnosis not present

## 2022-09-11 DIAGNOSIS — E119 Type 2 diabetes mellitus without complications: Secondary | ICD-10-CM | POA: Insufficient documentation

## 2022-09-11 DIAGNOSIS — Z7984 Long term (current) use of oral hypoglycemic drugs: Secondary | ICD-10-CM | POA: Diagnosis not present

## 2022-09-11 DIAGNOSIS — Z96611 Presence of right artificial shoulder joint: Secondary | ICD-10-CM | POA: Diagnosis not present

## 2022-09-11 DIAGNOSIS — Z5986 Financial insecurity: Secondary | ICD-10-CM | POA: Diagnosis not present

## 2022-09-11 DIAGNOSIS — E0500 Thyrotoxicosis with diffuse goiter without thyrotoxic crisis or storm: Secondary | ICD-10-CM | POA: Diagnosis not present

## 2022-09-11 DIAGNOSIS — N289 Disorder of kidney and ureter, unspecified: Secondary | ICD-10-CM | POA: Diagnosis not present

## 2022-09-11 DIAGNOSIS — Z833 Family history of diabetes mellitus: Secondary | ICD-10-CM | POA: Insufficient documentation

## 2022-09-11 DIAGNOSIS — Z471 Aftercare following joint replacement surgery: Secondary | ICD-10-CM | POA: Diagnosis not present

## 2022-09-11 HISTORY — PX: REVERSE SHOULDER ARTHROPLASTY: SHX5054

## 2022-09-11 LAB — GLUCOSE, CAPILLARY
Glucose-Capillary: 142 mg/dL — ABNORMAL HIGH (ref 70–99)
Glucose-Capillary: 117 mg/dL — ABNORMAL HIGH (ref 70–99)

## 2022-09-11 SURGERY — ARTHROPLASTY, SHOULDER, TOTAL, REVERSE
Anesthesia: Regional | Site: Shoulder | Laterality: Right

## 2022-09-11 MED ORDER — ACETAMINOPHEN 500 MG PO TABS
1000.0000 mg | ORAL_TABLET | Freq: Once | ORAL | Status: AC
  Administered 2022-09-11: 1000 mg via ORAL
  Filled 2022-09-11: qty 2

## 2022-09-11 MED ORDER — DEXAMETHASONE SODIUM PHOSPHATE 10 MG/ML IJ SOLN
INTRAMUSCULAR | Status: AC
  Filled 2022-09-11: qty 1

## 2022-09-11 MED ORDER — LIDOCAINE 2% (20 MG/ML) 5 ML SYRINGE
INTRAMUSCULAR | Status: DC | PRN
  Administered 2022-09-11: 20 mg via INTRAVENOUS

## 2022-09-11 MED ORDER — CHLORHEXIDINE GLUCONATE 0.12 % MT SOLN
15.0000 mL | Freq: Once | OROMUCOSAL | Status: AC
  Administered 2022-09-11: 15 mL via OROMUCOSAL

## 2022-09-11 MED ORDER — MIDAZOLAM HCL 5 MG/5ML IJ SOLN
INTRAMUSCULAR | Status: DC | PRN
  Administered 2022-09-11: 2 mg via INTRAVENOUS

## 2022-09-11 MED ORDER — FENTANYL CITRATE (PF) 100 MCG/2ML IJ SOLN
INTRAMUSCULAR | Status: DC | PRN
  Administered 2022-09-11: 50 ug via INTRAVENOUS

## 2022-09-11 MED ORDER — CEFAZOLIN SODIUM-DEXTROSE 2-4 GM/100ML-% IV SOLN
2.0000 g | INTRAVENOUS | Status: DC
  Filled 2022-09-11: qty 100

## 2022-09-11 MED ORDER — SUGAMMADEX SODIUM 200 MG/2ML IV SOLN
INTRAVENOUS | Status: DC | PRN
  Administered 2022-09-11: 200 mg via INTRAVENOUS

## 2022-09-11 MED ORDER — ONDANSETRON HCL 4 MG PO TABS: 4.0000 mg | ORAL_TABLET | Freq: Three times a day (TID) | ORAL | 0 refills | Status: DC | PRN

## 2022-09-11 MED ORDER — ORAL CARE MOUTH RINSE: 15.0000 mL | Freq: Once | OROMUCOSAL | Status: AC

## 2022-09-11 MED ORDER — BUPIVACAINE HCL (PF) 0.5 % IJ SOLN
INTRAMUSCULAR | Status: DC | PRN
  Administered 2022-09-11: 15 mL via PERINEURAL

## 2022-09-11 MED ORDER — ISOPROPYL ALCOHOL 70 % SOLN
Status: AC
  Filled 2022-09-11: qty 480

## 2022-09-11 MED ORDER — BUPIVACAINE LIPOSOME 1.3 % IJ SUSP
INTRAMUSCULAR | Status: DC | PRN
  Administered 2022-09-11: 10 mL via PERINEURAL

## 2022-09-11 MED ORDER — TRANEXAMIC ACID-NACL 1000-0.7 MG/100ML-% IV SOLN
1000.0000 mg | INTRAVENOUS | Status: DC
  Filled 2022-09-11: qty 100

## 2022-09-11 MED ORDER — FENTANYL CITRATE PF 50 MCG/ML IJ SOSY
PREFILLED_SYRINGE | INTRAMUSCULAR | Status: AC
  Filled 2022-09-11: qty 2

## 2022-09-11 MED ORDER — DEXMEDETOMIDINE HCL IN NACL 80 MCG/20ML IV SOLN
INTRAVENOUS | Status: AC
  Filled 2022-09-11: qty 20

## 2022-09-11 MED ORDER — PROPOFOL 10 MG/ML IV BOLUS
INTRAVENOUS | Status: DC | PRN
  Administered 2022-09-11: 100 mg via INTRAVENOUS

## 2022-09-11 MED ORDER — FENTANYL CITRATE PF 50 MCG/ML IJ SOSY
PREFILLED_SYRINGE | INTRAMUSCULAR | Status: AC
  Filled 2022-09-11: qty 1

## 2022-09-11 MED ORDER — 0.9 % SODIUM CHLORIDE (POUR BTL) OPTIME
TOPICAL | Status: DC | PRN
  Administered 2022-09-11: 1000 mL

## 2022-09-11 MED ORDER — PHENYLEPHRINE HCL (PRESSORS) 10 MG/ML IV SOLN
INTRAVENOUS | Status: AC
  Filled 2022-09-11: qty 1

## 2022-09-11 MED ORDER — LACTATED RINGERS IV SOLN: INTRAVENOUS | Status: DC

## 2022-09-11 MED ORDER — ROCURONIUM BROMIDE 10 MG/ML (PF) SYRINGE
PREFILLED_SYRINGE | INTRAVENOUS | Status: DC | PRN
  Administered 2022-09-11: 50 mg via INTRAVENOUS

## 2022-09-11 MED ORDER — LIDOCAINE HCL (PF) 2 % IJ SOLN
INTRAMUSCULAR | Status: AC
  Filled 2022-09-11: qty 20

## 2022-09-11 MED ORDER — PHENYLEPHRINE HCL-NACL 20-0.9 MG/250ML-% IV SOLN
INTRAVENOUS | Status: DC | PRN
  Administered 2022-09-11: 50 ug/min via INTRAVENOUS

## 2022-09-11 MED ORDER — PHENYLEPHRINE 80 MCG/ML (10ML) SYRINGE FOR IV PUSH (FOR BLOOD PRESSURE SUPPORT)
PREFILLED_SYRINGE | INTRAVENOUS | Status: DC | PRN
  Administered 2022-09-11 (×2): 160 ug via INTRAVENOUS
  Administered 2022-09-11 (×2): 80 ug via INTRAVENOUS

## 2022-09-11 MED ORDER — MIDAZOLAM HCL 2 MG/2ML IJ SOLN
INTRAMUSCULAR | Status: AC
  Filled 2022-09-11: qty 2

## 2022-09-11 MED ORDER — OXYCODONE HCL 5 MG PO TABS: 5.0000 mg | ORAL_TABLET | Freq: Four times a day (QID) | ORAL | 0 refills | Status: DC | PRN

## 2022-09-11 MED ORDER — ONDANSETRON HCL 4 MG/2ML IJ SOLN
INTRAMUSCULAR | Status: DC | PRN
  Administered 2022-09-11: 4 mg via INTRAVENOUS

## 2022-09-11 MED ORDER — VANCOMYCIN HCL 1000 MG IV SOLR
INTRAVENOUS | Status: AC
  Filled 2022-09-11: qty 20

## 2022-09-11 MED ORDER — FENTANYL CITRATE PF 50 MCG/ML IJ SOSY
25.0000 ug | PREFILLED_SYRINGE | INTRAMUSCULAR | Status: DC | PRN
  Administered 2022-09-11 (×2): 50 ug via INTRAVENOUS
  Administered 2022-09-11 (×2): 25 ug via INTRAVENOUS

## 2022-09-11 MED ORDER — DEXAMETHASONE SODIUM PHOSPHATE 10 MG/ML IJ SOLN
INTRAMUSCULAR | Status: DC | PRN
  Administered 2022-09-11: 10 mg via INTRAVENOUS

## 2022-09-11 MED ORDER — PROPOFOL 10 MG/ML IV BOLUS
INTRAVENOUS | Status: AC
  Filled 2022-09-11: qty 20

## 2022-09-11 MED ORDER — FENTANYL CITRATE (PF) 100 MCG/2ML IJ SOLN
INTRAMUSCULAR | Status: AC
  Filled 2022-09-11: qty 2

## 2022-09-11 MED ORDER — ROCURONIUM BROMIDE 10 MG/ML (PF) SYRINGE
PREFILLED_SYRINGE | INTRAVENOUS | Status: AC
  Filled 2022-09-11: qty 10

## 2022-09-11 MED ORDER — VANCOMYCIN HCL 1000 MG IV SOLR
INTRAVENOUS | Status: DC | PRN
  Administered 2022-09-11: 1000 mg

## 2022-09-11 MED ORDER — ONDANSETRON HCL 4 MG/2ML IJ SOLN
INTRAMUSCULAR | Status: AC
  Filled 2022-09-11: qty 2

## 2022-09-11 SURGICAL SUPPLY — 65 items
BAG COUNTER SPONGE SURGICOUNT (BAG) IMPLANT
BAG ZIPLOCK 12X15 (MISCELLANEOUS) ×1 IMPLANT
BIT DRILL FLUTED 3.0 STRL (BIT) IMPLANT
BLADE SAG 18X100X1.27 (BLADE) ×1 IMPLANT
COMP HUM CUP REV SHLD 33 +2 RT (Shoulder) ×1 IMPLANT
COMP HUM GLENOID RSA 33 +3 (Shoulder) ×1 IMPLANT
COMPONENT HUM CUP SHLD33+2RT (Shoulder) IMPLANT
COMPONENT HUM GLENOD RSA 33 +3 (Shoulder) IMPLANT
COVER BACK TABLE 60X90IN (DRAPES) ×1 IMPLANT
COVER SURGICAL LIGHT HANDLE (MISCELLANEOUS) ×1 IMPLANT
DRAPE ORTHO SPLIT 77X108 STRL (DRAPES) ×2
DRAPE SHEET LG 3/4 BI-LAMINATE (DRAPES) ×1 IMPLANT
DRAPE SURG 17X11 SM STRL (DRAPES) ×1 IMPLANT
DRAPE SURG ORHT 6 SPLT 77X108 (DRAPES) ×2 IMPLANT
DRAPE TOP 10253 STERILE (DRAPES) ×1 IMPLANT
DRAPE U-SHAPE 47X51 STRL (DRAPES) ×1 IMPLANT
DRILL SURG AR 10 (DRILL) IMPLANT
DRSG AQUACEL AG ADV 3.5X 4 (GAUZE/BANDAGES/DRESSINGS) IMPLANT
DRSG AQUACEL AG ADV 3.5X 6 (GAUZE/BANDAGES/DRESSINGS) IMPLANT
DRSG AQUACEL AG ADV 3.5X10 (GAUZE/BANDAGES/DRESSINGS) ×1 IMPLANT
DURAPREP 26ML APPLICATOR (WOUND CARE) IMPLANT
ELECT REM PT RETURN 15FT ADLT (MISCELLANEOUS) ×1 IMPLANT
FACESHIELD WRAPAROUND (MASK) ×1 IMPLANT
FACESHIELD WRAPAROUND OR TEAM (MASK) ×1 IMPLANT
GLENOID UNI REV MOD 24 +2 LAT (Joint) IMPLANT
GLENOSPHERE 36 +4 LAT/24 (Joint) IMPLANT
GLOVE BIO SURGEON STRL SZ7.5 (GLOVE) ×4 IMPLANT
GLOVE BIOGEL PI IND STRL 8 (GLOVE) ×2 IMPLANT
GOWN STRL REUS W/ TWL XL LVL3 (GOWN DISPOSABLE) ×2 IMPLANT
GOWN STRL REUS W/TWL XL LVL3 (GOWN DISPOSABLE) ×2
KIT BASIN OR (CUSTOM PROCEDURE TRAY) ×1 IMPLANT
KIT TURNOVER KIT A (KITS) IMPLANT
MANIFOLD NEPTUNE II (INSTRUMENTS) ×1 IMPLANT
NDL TAPERED W/ NITINOL LOOP (MISCELLANEOUS) IMPLANT
NEEDLE TAPERED W/ NITINOL LOOP (MISCELLANEOUS) IMPLANT
NS IRRIG 1000ML POUR BTL (IV SOLUTION) ×1 IMPLANT
PACK SHOULDER (CUSTOM PROCEDURE TRAY) ×1 IMPLANT
PAD CAST 4YDX4 CTTN HI CHSV (CAST SUPPLIES) ×1 IMPLANT
PADDING CAST COTTON 4X4 STRL (CAST SUPPLIES) ×1
PIN SET MODULAR GLENOID SYSTEM (PIN) IMPLANT
RESTRAINT HEAD UNIVERSAL NS (MISCELLANEOUS) IMPLANT
SCREW CENTRAL MOD 35 (Screw) IMPLANT
SCREW PERI LOCK 5.5X16 (Screw) IMPLANT
SCREW PERIPHERAL 5.5X28 LOCK (Screw) IMPLANT
SCREW PERIPHERAL 5.5X40 LOCK (Screw) IMPLANT
SLING ARM FOAM STRAP LRG (SOFTGOODS) IMPLANT
SLING ARM FOAM STRAP MED (SOFTGOODS) IMPLANT
SMARTMIX MINI TOWER (MISCELLANEOUS)
SPONGE T-LAP 4X18 ~~LOC~~+RFID (SPONGE) IMPLANT
STEM HUMERAL UNI REVERS SZ6 (Stem) IMPLANT
STRIP CLOSURE SKIN 1/2X4 (GAUZE/BANDAGES/DRESSINGS) ×1 IMPLANT
SUCTION FRAZIER HANDLE 10FR (MISCELLANEOUS) ×1
SUCTION TUBE FRAZIER 10FR DISP (MISCELLANEOUS) ×1 IMPLANT
SUT FIBERWIRE #2 38 T-5 BLUE (SUTURE)
SUT MNCRL AB 3-0 PS2 18 (SUTURE) ×1 IMPLANT
SUT VIC AB 0 CT1 36 (SUTURE) ×1 IMPLANT
SUT VIC AB 1 CT1 36 (SUTURE) ×1 IMPLANT
SUT VIC AB 2-0 CT1 27 (SUTURE) ×1
SUT VIC AB 2-0 CT1 TAPERPNT 27 (SUTURE) ×1 IMPLANT
SUTURE FIBERWR #2 38 T-5 BLUE (SUTURE) IMPLANT
SUTURE TAPE 1.3 40 TPR END (SUTURE) ×2 IMPLANT
SUTURETAPE 1.3 40 TPR END (SUTURE) ×2
TOWEL OR 17X26 10 PK STRL BLUE (TOWEL DISPOSABLE) ×1 IMPLANT
TOWER SMARTMIX MINI (MISCELLANEOUS) IMPLANT
TUBE SUCTION HIGH CAP CLEAR NV (SUCTIONS) ×1 IMPLANT

## 2022-09-11 NOTE — Anesthesia Procedure Notes (Signed)
Anesthesia Regional Block: Interscalene brachial plexus block   Pre-Anesthetic Checklist: , timeout performed,  Correct Patient, Correct Site, Correct Laterality,  Correct Procedure, Correct Position, site marked,  Risks and benefits discussed,  Pre-op evaluation,  At surgeon's request and post-op pain management  Laterality: Right  Prep: Maximum Sterile Barrier Precautions used, chloraprep       Needles:  Injection technique: Single-shot  Needle Type: Echogenic Stimulator Needle     Needle Length: 5cm  Needle Gauge: 21     Additional Needles:   Procedures:,,,, ultrasound used (permanent image in chart),,    Narrative:  Start time: 09/11/2022 7:00 AM End time: 09/11/2022 7:03 AM Injection made incrementally with aspirations every 5 mL. Anesthesiologist: Freddrick March, MD

## 2022-09-11 NOTE — Anesthesia Procedure Notes (Signed)
Procedure Name: Intubation Date/Time: 09/11/2022 7:24 AM  Performed by: Gerald Leitz, CRNAPre-anesthesia Checklist: Patient identified, Patient being monitored, Timeout performed, Emergency Drugs available and Suction available Patient Re-evaluated:Patient Re-evaluated prior to induction Oxygen Delivery Method: Circle system utilized Preoxygenation: Pre-oxygenation with 100% oxygen Induction Type: IV induction Ventilation: Mask ventilation without difficulty Laryngoscope Size: Mac and 3 Grade View: Grade I Tube type: Oral Tube size: 7.5 mm Number of attempts: 1 Airway Equipment and Method: Stylet Placement Confirmation: ETT inserted through vocal cords under direct vision, positive ETCO2 and breath sounds checked- equal and bilateral Secured at: 22 cm Tube secured with: Tape Dental Injury: Teeth and Oropharynx as per pre-operative assessment

## 2022-09-11 NOTE — Op Note (Signed)
09/11/2022  11:49 AM  PATIENT:  Emily Wood    PRE-OPERATIVE DIAGNOSIS:  Right shoulder rotator cuff arthropathy  POST-OPERATIVE DIAGNOSIS:  Same  PROCEDURE:  REVERSE SHOULDER ARTHROPLASTY  SURGEON:  Nicholes Stairs, MD  ASSISTANT: Jonelle Sidle, PA-C  Assistant attestation:  PA McClung present for the entire procedure.  ANESTHESIA:   General with interscalene Exparel  ESTIMATED BLOOD LOSS: 50 cc  PREOPERATIVE INDICATIONS:  Emily Wood is a  72 y.o. female with a diagnosis of Right shoulder rotator cuff arthropathy who failed conservative measures and elected for surgical management.    The risks benefits and alternatives were discussed with the patient preoperatively including but not limited to the risks of infection, bleeding, nerve injury, cardiopulmonary complications, the need for revision surgery, dislocation, brachial plexus palsy, incomplete relief of pain, among others, and the patient was willing to proceed.  OPERATIVE IMPLANTS:  Arthrex universe reverse system with size 6 stem 33-36 combo standard humeral tray with a +3 constrained liner 24 mm +2 lateralized glenoid baseplate with a [id number] lateralized glenosphere 35 mm central screw 4 peripheral locking screws   OPERATIVE FINDINGS:  Significant high riding humeral head with chronic greater tuberosity deficiency from abutment in the lateral aspect of the acromion.  Massive and retracted and chronic appearing superior cuff tear with flattened long head of biceps tendon consistent with advanced rotator cuff arthropathy disease.  Teres minor and subscapularis were intact.  There was degenerative change on the humeral side but no degenerative change on the glenoid side.  OPERATIVE PROCEDURE: The patient was brought to the operating room and placed in the supine position. General anesthesia was administered. IV antibiotics were given. A Foley was not placed. Time out was performed. The upper extremity was  prepped and draped in usual sterile fashion. The patient was in a beachchair position. Deltopectoral approach was carried out. The biceps was tenodesed to the pectoralis tendon with #2 Fiberwire. The subscapularis was released off of the bone.   I then performed circumferential releases of the humerus, and then dislocated the head, and then reamed with the reamer to the above named size.  I then applied the jig, and cut the humeral head in 30 of retroversion, and then turned my attention to the glenoid.  Deep retractors were placed, and I resected the labrum, and then placed a guidepin into the center position on the glenoid, with slight inferior inclination. I then reamed over the guidepin, and this created a small metaphyseal cancellus blush inferiorly, removing just the cartilage to the subchondral bone superiorly. The base plate was selected and impacted place, and then I secured it centrally with a nonlocking screw, and I had excellent purchase both inferiorly and superiorly. I placed a short locking screws on anterior and posterior aspects.  I then turned my attention to the glenosphere, and impacted this into place.  The glenoid sphere was completely seated, the central setscrew was then placed and confirmed to have excellent security.  I sequentially broached, and then trialed, and was found to restore soft tissue tension, and it had 2 finger tightness. Therefore the above named components were selected. The shoulder felt stable throughout functional motion.   I then impacted the real prosthesis into place, as well as the real humeral tray, and reduced the shoulder. The shoulder had excellent motion, and was stable, and I irrigated the wounds copiously.   Subscapularis tendon was not repaired due to poor tendon quality as well as the now lateralized humerus.  I then irrigated the shoulder copiously once more, repaired the deltopectoral interval with #2 FiberWire followed by subcutaneous  Vicryl, then monocryl for the skin,  with Steri-Strips and sterile gauze for the skin. The patient was awakened and returned back in stable and satisfactory condition. There no complications and they tolerated the procedure well.  All counts were correct x2.  Disposition:  Ms. Haner will be in her sling for the first 2 weeks postoperatively.  She may begin using the arm immediately for activities of daily living.  She will limit weightbearing however, to no more than 5 pounds for the first few weeks.  I will see her back in the office in 2 weeks for AP and scapular Y x-rays.  She will discharge home today from PACU.

## 2022-09-11 NOTE — Anesthesia Postprocedure Evaluation (Signed)
Anesthesia Post Note  Patient: Emily Wood  Procedure(s) Performed: REVERSE SHOULDER ARTHROPLASTY (Right: Shoulder)     Patient location during evaluation: PACU Anesthesia Type: Regional and General Level of consciousness: awake and alert Pain management: pain level controlled Vital Signs Assessment: post-procedure vital signs reviewed and stable Respiratory status: spontaneous breathing, nonlabored ventilation, respiratory function stable and patient connected to nasal cannula oxygen Cardiovascular status: blood pressure returned to baseline and stable Postop Assessment: no apparent nausea or vomiting Anesthetic complications: no  No notable events documented.  Last Vitals:  Vitals:   09/11/22 1035 09/11/22 1043  BP: (!) 146/74 (!) 144/66  Pulse: (!) 52 (!) 52  Resp: 10 12  Temp: 37.1 C   SpO2: 94% 92%    Last Pain:  Vitals:   09/11/22 1043  TempSrc:   PainSc: 0-No pain                 Joelys Staubs L Kalyn Dimattia

## 2022-09-11 NOTE — Discharge Instructions (Addendum)
Orthopedic surgery discharge instructions:  -Maintain postoperative bandage until follow-up appointment.  This is waterproof, and you may begin showering on postoperative day #3.  Do not submerge underwater.  Maintain that bandage until your follow-up appointment in 2 weeks.  -No lifting over 2 pounds with operateive arm.  You may use the arm immediately for activities of daily living such as bathing, washing your face and brushing your teeth, eating, and getting dressed.  Otherwise maintain your sling when you are out of the house and sleeping.  -Apply ice liberally to the shoulder throughout the day.  For mild to moderate pain use Tylenol and Advil as needed around-the-clock.  For breakthrough pain use oxycodone as necessary.  -You will return to see Dr. Rogers in the office in 2 weeks for routine postoperative check with x-rays.  

## 2022-09-11 NOTE — Brief Op Note (Signed)
09/11/2022  8:40 AM  PATIENT:  Kristine Garbe  72 y.o. female  PRE-OPERATIVE DIAGNOSIS:  Right shoulder rotator cuff arthropathy  POST-OPERATIVE DIAGNOSIS:  Right shoulder rotator cuff arthropathy  PROCEDURE:  Procedure(s) with comments: REVERSE SHOULDER ARTHROPLASTY (Right) - 120  SURGEON:  Surgeon(s) and Role:    * Stann Mainland, Elly Modena, MD - Primary  PHYSICIAN ASSISTANT:   ASSISTANTS: Jonelle Sidle, PA-C   ANESTHESIA:   regional and general  EBL:  50 cc  BLOOD ADMINISTERED:none  DRAINS: none   LOCAL MEDICATIONS USED:  NONE  SPECIMEN:  No Specimen  DISPOSITION OF SPECIMEN:  N/A  COUNTS:  YES  TOURNIQUET:  * No tourniquets in log *  DICTATION: .Note written in EPIC  PLAN OF CARE: Discharge to home after PACU  PATIENT DISPOSITION:  PACU - hemodynamically stable.   Delay start of Pharmacological VTE agent (>24hrs) due to surgical blood loss or risk of bleeding: not applicable

## 2022-09-11 NOTE — Transfer of Care (Signed)
Immediate Anesthesia Transfer of Care Note  Patient: Emily Wood  Procedure(s) Performed: Procedure(s) with comments: REVERSE SHOULDER ARTHROPLASTY (Right) - 120  Patient Location: PACU  Anesthesia Type:General  Level of Consciousness: Alert, Awake, Oriented  Airway & Oxygen Therapy: Patient Spontanous Breathing  Post-op Assessment: Report given to RN  Post vital signs: Reviewed and stable  Last Vitals:  Vitals:   09/11/22 0601  BP: 114/63  Pulse: 61  Resp: 18  Temp: 36.6 C  SpO2: 0000000    Complications: No apparent anesthesia complications

## 2022-09-11 NOTE — H&P (Signed)
ORTHOPAEDIC H&P  REQUESTING PHYSICIAN: Nicholes Stairs, MD  PCP:  Darreld Mclean, MD  Chief Complaint: Right shoulder rotator cuff arthropathy  HPI: Emily Wood is a 72 y.o. female who complains of right shoulder pain and weakness as well as dysfunction since an injury a few months back.  This resulted in a massive and retracted superior rotator cuff tear.  Today for reverse arthroplasty for definitive treatment.  Past Medical History:  Diagnosis Date   Arthritis    Diabetes mellitus without complication ([place])    Family history of adverse reaction to anesthesia    PONV   Graves disease 04/14/2020   Hypertension    Lymphedema    Mixed hyperlipidemia 02/20/2021   Past Surgical History:  Procedure Laterality Date   CATARACT EXTRACTION W/PHACO Right 04/14/2022   Procedure: CATARACT EXTRACTION PHACO AND INTRAOCULAR LENS PLACEMENT (IOC) RIGHT DIABETIC 10.93 00:53.0;  Surgeon: Birder Robson, MD;  Location: [facility];  Service: Ophthalmology;  Laterality: Right;   CATARACT EXTRACTION W/PHACO Left 04/28/2022   Procedure: CATARACT EXTRACTION PHACO AND INTRAOCULAR LENS PLACEMENT (IOC) LEFT DIABETIC 6.05 00:45.7;  Surgeon: Birder Robson, MD;  Location: [facility];  Service: Ophthalmology;  Laterality: Left;  Diabetic   CHOLECYSTECTOMY     HERNIA REPAIR     OTHER SURGICAL HISTORY     scar tissue removal from bowels   PARTIAL HYSTERECTOMY     Still has ovaries   UTERINE FIBROID SURGERY     Social History   Socioeconomic History   Marital status: Married    Spouse name: [name]   Number of children: 0   Years of education: 16   Highest education level: Not on file  Occupational History   Occupation: customer service  Tobacco Use   Smoking status: Never   Smokeless tobacco: Never  Vaping Use   Vaping Use: Never used  Substance and Sexual Activity   Alcohol use: No    Alcohol/week: 0.0 standard drinks of alcohol    Comment: rarely    Drug use: No   Sexual activity: Not Currently    Birth control/protection: Post-menopausal  Other Topics Concern   Not on file  Social History Narrative   Lives with husband   Caffeine use: 16oz coffee per day   Social Determinants of Health   Financial Resource Strain: Medium Risk (06/06/2021)   Overall Financial Resource Strain (CARDIA)    Difficulty of Paying Living Expenses: Somewhat hard  Food Insecurity: No Food Insecurity (05/22/2022)   Hunger Vital Sign    Worried About Running Out of Food in the Last Year: Never true    Ran Out of Food in the Last Year: Never true  Transportation Needs: No Transportation Needs (05/22/2022)   PRAPARE - Hydrologist (Medical): No    Lack of Transportation (Non-Medical): No  Physical Activity: Inactive (06/06/2021)   Exercise Vital Sign    Days of Exercise per Week: 0 days    Minutes of Exercise per Session: 0 min  Stress: No Stress Concern Present (06/06/2021)   [place]    Feeling of Stress : Not at all  Social Connections: Not on file   Family History  Problem Relation Age of Onset   Hyperlipidemia Mother    Cancer Sister    Hyperlipidemia Maternal Aunt    Diabetes Maternal Aunt    Breast cancer Cousin    Diabetes Maternal Uncle  No Known Allergies Prior to Admission medications   Medication Sig Start Date End Date Taking? Authorizing Provider  Accu-Chek Softclix Lancets lancets Check blood sugars once daily 06/09/22  Yes Copland, Gay Filler, MD  Alpha-Lipoic Acid 300 MG CAPS Take 600 mg by mouth daily.   Yes [provider]  amLODipine (NORVASC) 5 MG tablet TAKE 1 AND 1/2 TABLETS BY MOUTH  DAILY 04/21/22  Yes Copland, Gay Filler, MD  Ascorbic Acid (VITAMIN C) 1000 MG tablet Take 1,000 mg by mouth daily.   Yes [provider]  aspirin 81 MG tablet Take 81 mg by mouth daily.   Yes [provider]  atorvastatin  (LIPITOR) 40 MG tablet TAKE 1 TABLET BY MOUTH DAILY 03/03/22  Yes Tobb, Kardie, DO  Biotin w/ Vitamins C & E (HAIR/SKIN/NAILS PO) Take 6,000 mcg by mouth daily.   Yes [provider]  carvedilol (COREG) 12.5 MG tablet TAKE 1 TABLET BY MOUTH TWICE A DAY 07/24/22  Yes Tobb, Kardie, DO  Cholecalciferol (VITAMIN D3) 125 MCG (5000 UT) CAPS Take 1 capsule (5,000 Units total) by mouth daily. 05/23/22  Yes Danford, Suann Larry, MD  cloNIDine (CATAPRES) 0.2 MG tablet TAKE 1 TABLET BY MOUTH 3 TIMES  DAILY 04/21/22  Yes Copland, Gay Filler, MD  Cobalamin Combinations (B-12) [phone] MCG SUBL Place 5,000 mcg under the tongue daily.   Yes [provider]  ferrous sulfate 325 (65 FE) MG tablet Take 325 mg by mouth daily with breakfast.   Yes [provider]  gabapentin (NEURONTIN) 400 MG capsule TAKE 2 CAPSULES BY MOUTH 3 TIMES DAILY Patient taking differently: Take 800 mg by mouth 3 (three) times daily. 05/04/22  Yes Copland, Gay Filler, MD  gatifloxacin (ZYMAXID) 0.5 % SOLN Place 1 drop into both eyes 4 (four) times daily. 05/23/22  Yes Danford, Suann Larry, MD  GEMTESA 75 MG TABS Take 75 mg by mouth daily. 04/08/22  Yes [provider]  glucose blood (ACCU-CHEK GUIDE) test strip Check blood sugars once daily 06/09/22  Yes Copland, Gay Filler, MD  hydrochlorothiazide (HYDRODIURIL) 25 MG tablet TAKE 1 TABLET BY MOUTH DAILY 04/21/22  Yes Copland, Gay Filler, MD  irbesartan (AVAPRO) 300 MG tablet Take 1 tablet (300 mg total) by mouth daily. 06/01/22  Yes Copland, Gay Filler, MD  isosorbide mononitrate (IMDUR) 30 MG 24 hr tablet Take 1 tablet (30 mg total) by mouth daily. 03/06/22  Yes Tobb, Kardie, DO  methimazole (TAPAZOLE) 5 MG tablet Take 2.5 mg by mouth daily. 10/02/20  Yes [provider]  methocarbamol (ROBAXIN) 500 MG tablet Take 1 tablet (500 mg total) by mouth every 8 (eight) hours as needed for muscle spasms. 10/01/21  Yes Copland, Gay Filler, MD  Multiple Vitamin  (MULTIVITAMIN ADULT PO) Take 2 tablets by mouth daily.   Yes [provider]  oxybutynin (DITROPAN-XL) 10 MG 24 hr tablet Take 10 mg by mouth at bedtime.   Yes [provider]  OZEMPIC, 0.25 OR 0.5 MG/DOSE, 2 MG/3ML SOPN Inject 0.5 mg into the skin once a week. 10/15/21  Yes [provider]  potassium chloride SA (KLOR-CON M) 20 MEQ tablet TAKE 1 TABLET BY MOUTH DAILY 04/21/22  Yes Copland, Gay Filler, MD  prednisoLONE acetate (PRED FORTE) 1 % ophthalmic suspension Place 1 drop into both eyes 4 (four) times daily. Patient taking differently: Place 1 drop into both eyes in the morning, at noon, and at bedtime. 05/23/22  Yes Danford, Suann Larry, MD  torsemide (DEMADEX) 10 MG  tablet TAKE 1 TABLET (10 MG TOTAL) BY MOUTH DAILY. USE AS NEEDED FOR LOWER EXTREMITY SWELLING 09/07/22  Yes Copland, Gay Filler, MD  Turmeric 500 MG CAPS Take 500 mg by mouth daily.   Yes [provider]  diclofenac Sodium (VOLTAREN) 1 % GEL Apply 4 g topically 4 (four) times daily. Patient taking differently: Apply 4 g topically daily as needed. 08/04/21   Saguier, Percell Miller, PA-C  nystatin cream (MYCOSTATIN) Apply 1 application topically 2 (two) times daily. Patient taking differently: Apply 1 application  topically 2 (two) times daily as needed for dry skin. 10/07/20   Malachy Mood, MD   No results found.  Positive ROS: All other systems have been reviewed and were otherwise negative with the exception of those mentioned in the HPI and as above.  Physical Exam: General: Alert, no acute distress Cardiovascular: No pedal edema Respiratory: No cyanosis, no use of accessory musculature GI: No organomegaly, abdomen is soft and non-tender Skin: No lesions in the area of chief complaint Neurologic: Sensation intact distally Psychiatric: Patient is competent for consent with normal mood and affect Lymphatic: No axillary or cervical lymphadenopathy  MUSCULOSKELETAL: Right upper extremity is  warm and well-perfused with no open wounds or lesions.  She is neurovascular intact.  Assessment: Right shoulder rotator cuff arthropathy  Plan: Plan to proceed today with reverse arthroplasty for definitive treatment of the rotator cuff arthropathy right shoulder.  We discussed the risk of bleeding, infection, damage to surrounding nerves and vessels, stiffness, fracture, dislocation, need for revision surgery, as well as the risk of anesthesia.  She has provided informed consent.  We will plan for discharge home postoperatively from PACU.    Nicholes Stairs, MD Cell [phone]    09/11/2022 7:11 AM

## 2022-09-14 ENCOUNTER — Encounter (HOSPITAL_COMMUNITY): Payer: Self-pay | Admitting: Orthopedic Surgery

## 2022-09-18 DIAGNOSIS — H2000 Unspecified acute and subacute iridocyclitis: Secondary | ICD-10-CM | POA: Diagnosis not present

## 2022-09-18 DIAGNOSIS — E113311 Type 2 diabetes mellitus with moderate nonproliferative diabetic retinopathy with macular edema, right eye: Secondary | ICD-10-CM | POA: Diagnosis not present

## 2022-09-24 DIAGNOSIS — N1832 Chronic kidney disease, stage 3b: Secondary | ICD-10-CM | POA: Diagnosis not present

## 2022-09-24 DIAGNOSIS — Z4789 Encounter for other orthopedic aftercare: Secondary | ICD-10-CM | POA: Diagnosis not present

## 2022-09-26 ENCOUNTER — Other Ambulatory Visit: Payer: Self-pay | Admitting: Cardiology

## 2022-10-06 ENCOUNTER — Other Ambulatory Visit (INDEPENDENT_AMBULATORY_CARE_PROVIDER_SITE_OTHER): Payer: Self-pay | Admitting: Nurse Practitioner

## 2022-10-06 DIAGNOSIS — I890 Lymphedema, not elsewhere classified: Secondary | ICD-10-CM

## 2022-10-07 DIAGNOSIS — Z471 Aftercare following joint replacement surgery: Secondary | ICD-10-CM | POA: Diagnosis not present

## 2022-10-07 DIAGNOSIS — M1712 Unilateral primary osteoarthritis, left knee: Secondary | ICD-10-CM | POA: Diagnosis not present

## 2022-10-07 DIAGNOSIS — I129 Hypertensive chronic kidney disease with stage 1 through stage 4 chronic kidney disease, or unspecified chronic kidney disease: Secondary | ICD-10-CM | POA: Diagnosis not present

## 2022-10-07 DIAGNOSIS — I272 Pulmonary hypertension, unspecified: Secondary | ICD-10-CM | POA: Diagnosis not present

## 2022-10-07 DIAGNOSIS — D631 Anemia in chronic kidney disease: Secondary | ICD-10-CM | POA: Diagnosis not present

## 2022-10-07 DIAGNOSIS — Z7982 Long term (current) use of aspirin: Secondary | ICD-10-CM | POA: Diagnosis not present

## 2022-10-07 DIAGNOSIS — G8929 Other chronic pain: Secondary | ICD-10-CM | POA: Diagnosis not present

## 2022-10-07 DIAGNOSIS — E11319 Type 2 diabetes mellitus with unspecified diabetic retinopathy without macular edema: Secondary | ICD-10-CM | POA: Diagnosis not present

## 2022-10-07 DIAGNOSIS — E782 Mixed hyperlipidemia: Secondary | ICD-10-CM | POA: Diagnosis not present

## 2022-10-07 DIAGNOSIS — E0500 Thyrotoxicosis with diffuse goiter without thyrotoxic crisis or storm: Secondary | ICD-10-CM | POA: Diagnosis not present

## 2022-10-07 DIAGNOSIS — E1122 Type 2 diabetes mellitus with diabetic chronic kidney disease: Secondary | ICD-10-CM | POA: Diagnosis not present

## 2022-10-07 DIAGNOSIS — I071 Rheumatic tricuspid insufficiency: Secondary | ICD-10-CM | POA: Diagnosis not present

## 2022-10-07 DIAGNOSIS — I2510 Atherosclerotic heart disease of native coronary artery without angina pectoris: Secondary | ICD-10-CM | POA: Diagnosis not present

## 2022-10-07 DIAGNOSIS — Z7985 Long-term (current) use of injectable non-insulin antidiabetic drugs: Secondary | ICD-10-CM | POA: Diagnosis not present

## 2022-10-07 DIAGNOSIS — E1151 Type 2 diabetes mellitus with diabetic peripheral angiopathy without gangrene: Secondary | ICD-10-CM | POA: Diagnosis not present

## 2022-10-07 DIAGNOSIS — E1100 Type 2 diabetes mellitus with hyperosmolarity without nonketotic hyperglycemic-hyperosmolar coma (NKHHC): Secondary | ICD-10-CM | POA: Diagnosis not present

## 2022-10-07 DIAGNOSIS — I890 Lymphedema, not elsewhere classified: Secondary | ICD-10-CM | POA: Diagnosis not present

## 2022-10-07 DIAGNOSIS — I872 Venous insufficiency (chronic) (peripheral): Secondary | ICD-10-CM | POA: Diagnosis not present

## 2022-10-07 DIAGNOSIS — Z96611 Presence of right artificial shoulder joint: Secondary | ICD-10-CM | POA: Diagnosis not present

## 2022-10-07 DIAGNOSIS — N1831 Chronic kidney disease, stage 3a: Secondary | ICD-10-CM | POA: Diagnosis not present

## 2022-10-07 DIAGNOSIS — H409 Unspecified glaucoma: Secondary | ICD-10-CM | POA: Diagnosis not present

## 2022-10-08 DIAGNOSIS — E113311 Type 2 diabetes mellitus with moderate nonproliferative diabetic retinopathy with macular edema, right eye: Secondary | ICD-10-CM | POA: Diagnosis not present

## 2022-10-12 DIAGNOSIS — I872 Venous insufficiency (chronic) (peripheral): Secondary | ICD-10-CM | POA: Diagnosis not present

## 2022-10-12 DIAGNOSIS — Z7985 Long-term (current) use of injectable non-insulin antidiabetic drugs: Secondary | ICD-10-CM | POA: Diagnosis not present

## 2022-10-12 DIAGNOSIS — D631 Anemia in chronic kidney disease: Secondary | ICD-10-CM | POA: Diagnosis not present

## 2022-10-12 DIAGNOSIS — H409 Unspecified glaucoma: Secondary | ICD-10-CM | POA: Diagnosis not present

## 2022-10-12 DIAGNOSIS — I129 Hypertensive chronic kidney disease with stage 1 through stage 4 chronic kidney disease, or unspecified chronic kidney disease: Secondary | ICD-10-CM | POA: Diagnosis not present

## 2022-10-12 DIAGNOSIS — E11319 Type 2 diabetes mellitus with unspecified diabetic retinopathy without macular edema: Secondary | ICD-10-CM | POA: Diagnosis not present

## 2022-10-12 DIAGNOSIS — I071 Rheumatic tricuspid insufficiency: Secondary | ICD-10-CM | POA: Diagnosis not present

## 2022-10-12 DIAGNOSIS — G8929 Other chronic pain: Secondary | ICD-10-CM | POA: Diagnosis not present

## 2022-10-12 DIAGNOSIS — Z96611 Presence of right artificial shoulder joint: Secondary | ICD-10-CM | POA: Diagnosis not present

## 2022-10-12 DIAGNOSIS — E1122 Type 2 diabetes mellitus with diabetic chronic kidney disease: Secondary | ICD-10-CM | POA: Diagnosis not present

## 2022-10-12 DIAGNOSIS — I2510 Atherosclerotic heart disease of native coronary artery without angina pectoris: Secondary | ICD-10-CM | POA: Diagnosis not present

## 2022-10-12 DIAGNOSIS — E782 Mixed hyperlipidemia: Secondary | ICD-10-CM | POA: Diagnosis not present

## 2022-10-12 DIAGNOSIS — Z7982 Long term (current) use of aspirin: Secondary | ICD-10-CM | POA: Diagnosis not present

## 2022-10-12 DIAGNOSIS — E1100 Type 2 diabetes mellitus with hyperosmolarity without nonketotic hyperglycemic-hyperosmolar coma (NKHHC): Secondary | ICD-10-CM | POA: Diagnosis not present

## 2022-10-12 DIAGNOSIS — E1151 Type 2 diabetes mellitus with diabetic peripheral angiopathy without gangrene: Secondary | ICD-10-CM | POA: Diagnosis not present

## 2022-10-12 DIAGNOSIS — Z471 Aftercare following joint replacement surgery: Secondary | ICD-10-CM | POA: Diagnosis not present

## 2022-10-12 DIAGNOSIS — I890 Lymphedema, not elsewhere classified: Secondary | ICD-10-CM | POA: Diagnosis not present

## 2022-10-12 DIAGNOSIS — N1831 Chronic kidney disease, stage 3a: Secondary | ICD-10-CM | POA: Diagnosis not present

## 2022-10-12 DIAGNOSIS — M1712 Unilateral primary osteoarthritis, left knee: Secondary | ICD-10-CM | POA: Diagnosis not present

## 2022-10-12 DIAGNOSIS — I272 Pulmonary hypertension, unspecified: Secondary | ICD-10-CM | POA: Diagnosis not present

## 2022-10-12 DIAGNOSIS — E0500 Thyrotoxicosis with diffuse goiter without thyrotoxic crisis or storm: Secondary | ICD-10-CM | POA: Diagnosis not present

## 2022-10-13 ENCOUNTER — Other Ambulatory Visit: Payer: Self-pay | Admitting: Cardiology

## 2022-10-14 DIAGNOSIS — Z7982 Long term (current) use of aspirin: Secondary | ICD-10-CM | POA: Diagnosis not present

## 2022-10-14 DIAGNOSIS — E0500 Thyrotoxicosis with diffuse goiter without thyrotoxic crisis or storm: Secondary | ICD-10-CM | POA: Diagnosis not present

## 2022-10-14 DIAGNOSIS — I272 Pulmonary hypertension, unspecified: Secondary | ICD-10-CM | POA: Diagnosis not present

## 2022-10-14 DIAGNOSIS — I129 Hypertensive chronic kidney disease with stage 1 through stage 4 chronic kidney disease, or unspecified chronic kidney disease: Secondary | ICD-10-CM | POA: Diagnosis not present

## 2022-10-14 DIAGNOSIS — Z96611 Presence of right artificial shoulder joint: Secondary | ICD-10-CM | POA: Diagnosis not present

## 2022-10-14 DIAGNOSIS — I890 Lymphedema, not elsewhere classified: Secondary | ICD-10-CM | POA: Diagnosis not present

## 2022-10-14 DIAGNOSIS — Z7985 Long-term (current) use of injectable non-insulin antidiabetic drugs: Secondary | ICD-10-CM | POA: Diagnosis not present

## 2022-10-14 DIAGNOSIS — E1122 Type 2 diabetes mellitus with diabetic chronic kidney disease: Secondary | ICD-10-CM | POA: Diagnosis not present

## 2022-10-14 DIAGNOSIS — E782 Mixed hyperlipidemia: Secondary | ICD-10-CM | POA: Diagnosis not present

## 2022-10-14 DIAGNOSIS — Z471 Aftercare following joint replacement surgery: Secondary | ICD-10-CM | POA: Diagnosis not present

## 2022-10-14 DIAGNOSIS — I872 Venous insufficiency (chronic) (peripheral): Secondary | ICD-10-CM | POA: Diagnosis not present

## 2022-10-14 DIAGNOSIS — N1831 Chronic kidney disease, stage 3a: Secondary | ICD-10-CM | POA: Diagnosis not present

## 2022-10-14 DIAGNOSIS — E11319 Type 2 diabetes mellitus with unspecified diabetic retinopathy without macular edema: Secondary | ICD-10-CM | POA: Diagnosis not present

## 2022-10-14 DIAGNOSIS — H409 Unspecified glaucoma: Secondary | ICD-10-CM | POA: Diagnosis not present

## 2022-10-14 DIAGNOSIS — I2510 Atherosclerotic heart disease of native coronary artery without angina pectoris: Secondary | ICD-10-CM | POA: Diagnosis not present

## 2022-10-14 DIAGNOSIS — E1100 Type 2 diabetes mellitus with hyperosmolarity without nonketotic hyperglycemic-hyperosmolar coma (NKHHC): Secondary | ICD-10-CM | POA: Diagnosis not present

## 2022-10-14 DIAGNOSIS — M1712 Unilateral primary osteoarthritis, left knee: Secondary | ICD-10-CM | POA: Diagnosis not present

## 2022-10-14 DIAGNOSIS — D631 Anemia in chronic kidney disease: Secondary | ICD-10-CM | POA: Diagnosis not present

## 2022-10-14 DIAGNOSIS — G8929 Other chronic pain: Secondary | ICD-10-CM | POA: Diagnosis not present

## 2022-10-14 DIAGNOSIS — I071 Rheumatic tricuspid insufficiency: Secondary | ICD-10-CM | POA: Diagnosis not present

## 2022-10-14 DIAGNOSIS — E1151 Type 2 diabetes mellitus with diabetic peripheral angiopathy without gangrene: Secondary | ICD-10-CM | POA: Diagnosis not present

## 2022-10-14 NOTE — Telephone Encounter (Signed)
Refill to pharmacy 

## 2022-10-19 ENCOUNTER — Telehealth: Payer: Self-pay

## 2022-10-19 ENCOUNTER — Telehealth (INDEPENDENT_AMBULATORY_CARE_PROVIDER_SITE_OTHER): Payer: Medicare Other | Admitting: Family Medicine

## 2022-10-19 DIAGNOSIS — E1151 Type 2 diabetes mellitus with diabetic peripheral angiopathy without gangrene: Secondary | ICD-10-CM | POA: Diagnosis not present

## 2022-10-19 DIAGNOSIS — E782 Mixed hyperlipidemia: Secondary | ICD-10-CM | POA: Diagnosis not present

## 2022-10-19 DIAGNOSIS — Z96611 Presence of right artificial shoulder joint: Secondary | ICD-10-CM | POA: Diagnosis not present

## 2022-10-19 DIAGNOSIS — I2510 Atherosclerotic heart disease of native coronary artery without angina pectoris: Secondary | ICD-10-CM | POA: Diagnosis not present

## 2022-10-19 DIAGNOSIS — G8929 Other chronic pain: Secondary | ICD-10-CM | POA: Diagnosis not present

## 2022-10-19 DIAGNOSIS — I071 Rheumatic tricuspid insufficiency: Secondary | ICD-10-CM | POA: Diagnosis not present

## 2022-10-19 DIAGNOSIS — H409 Unspecified glaucoma: Secondary | ICD-10-CM | POA: Diagnosis not present

## 2022-10-19 DIAGNOSIS — N1831 Chronic kidney disease, stage 3a: Secondary | ICD-10-CM | POA: Diagnosis not present

## 2022-10-19 DIAGNOSIS — E1100 Type 2 diabetes mellitus with hyperosmolarity without nonketotic hyperglycemic-hyperosmolar coma (NKHHC): Secondary | ICD-10-CM | POA: Diagnosis not present

## 2022-10-19 DIAGNOSIS — I872 Venous insufficiency (chronic) (peripheral): Secondary | ICD-10-CM | POA: Diagnosis not present

## 2022-10-19 DIAGNOSIS — J40 Bronchitis, not specified as acute or chronic: Secondary | ICD-10-CM | POA: Diagnosis not present

## 2022-10-19 DIAGNOSIS — D631 Anemia in chronic kidney disease: Secondary | ICD-10-CM | POA: Diagnosis not present

## 2022-10-19 DIAGNOSIS — M1712 Unilateral primary osteoarthritis, left knee: Secondary | ICD-10-CM | POA: Diagnosis not present

## 2022-10-19 DIAGNOSIS — Z471 Aftercare following joint replacement surgery: Secondary | ICD-10-CM | POA: Diagnosis not present

## 2022-10-19 DIAGNOSIS — I272 Pulmonary hypertension, unspecified: Secondary | ICD-10-CM | POA: Diagnosis not present

## 2022-10-19 DIAGNOSIS — E0500 Thyrotoxicosis with diffuse goiter without thyrotoxic crisis or storm: Secondary | ICD-10-CM | POA: Diagnosis not present

## 2022-10-19 DIAGNOSIS — E11319 Type 2 diabetes mellitus with unspecified diabetic retinopathy without macular edema: Secondary | ICD-10-CM | POA: Diagnosis not present

## 2022-10-19 DIAGNOSIS — I129 Hypertensive chronic kidney disease with stage 1 through stage 4 chronic kidney disease, or unspecified chronic kidney disease: Secondary | ICD-10-CM | POA: Diagnosis not present

## 2022-10-19 DIAGNOSIS — E1122 Type 2 diabetes mellitus with diabetic chronic kidney disease: Secondary | ICD-10-CM | POA: Diagnosis not present

## 2022-10-19 DIAGNOSIS — I890 Lymphedema, not elsewhere classified: Secondary | ICD-10-CM | POA: Diagnosis not present

## 2022-10-19 DIAGNOSIS — Z7985 Long-term (current) use of injectable non-insulin antidiabetic drugs: Secondary | ICD-10-CM | POA: Diagnosis not present

## 2022-10-19 DIAGNOSIS — Z7982 Long term (current) use of aspirin: Secondary | ICD-10-CM | POA: Diagnosis not present

## 2022-10-19 MED ORDER — CEFDINIR 300 MG PO CAPS: 300.0000 mg | ORAL_CAPSULE | Freq: Two times a day (BID) | ORAL | 0 refills | Status: DC

## 2022-10-19 NOTE — Telephone Encounter (Signed)
Initial Comment Caller states she is needing to be seen today. Caller states she has flem that is not coming up, she just coughs and coughs. Translation No Nurse Assessment Nurse: Gareth Eagle, RN, Raquel Sarna Date/Time Eilene Ghazi Time): 10/16/2022 11:54:11 AM Confirm and document reason for call. If symptomatic, describe symptoms. ---Caller states she has a cough and chest congestion. Cough is non-productive but feels phlegm in chest. No fever. Does the patient have any new or worsening symptoms? ---Yes Will a triage be completed? ---Yes Related visit to physician within the last 2 weeks? ---No Does the PT have any chronic conditions? (i.e. diabetes, asthma, this includes High risk factors for pregnancy, etc.) ---Yes List chronic conditions. ---HTN Is this a behavioral health or substance abuse call? ---No Guidelines Guideline Title Affirmed Question Affirmed Notes Nurse Date/Time (Eastern Time) Cough - Acute NonProductive Cough with cold symptoms (e.g., runny nose, postnasal drip, throat clearing) Theodoro Grist 10/16/2022 11:55:29 AM Disp. Time Eilene Ghazi Time) Disposition Final User 10/16/2022 12:00:14 PM SEE PCP WITHIN 3 DAYS Yes Gareth Eagle, RN, Raquel Sarna PLEASE NOTE: All timestamps contained within this report are represented as [place] Standard Time. CONFIDENTIALTY NOTICE: This fax transmission is intended only for the addressee. It contains information that is legally privileged, confidential or otherwise protected from use or disclosure. If you are not the intended recipient, you are strictly prohibited from reviewing, disclosing, copying using or disseminating any of this information or taking any action in reliance on or regarding this information. If you have received this fax in error, please notify us immediately by telephone so that we can arrange for its return to [place]. Phone: [phone], Toll-Free: [phone], Fax: [phone] Page: 2 of 2 Call Id: [id number] Final  Disposition 10/16/2022 12:00:14 PM SEE PCP WITHIN 3 DAYS Yes Gareth Eagle, RN, Raquel Sarna Disposition Overriden: Home Care Override Reason: Patient's symptoms need a higher level of care Caller Disagree/Comply Comply Caller Understands Yes PreDisposition Did not know what to do Care Advice Given Per Guideline SEE PCP WITHIN 3 DAYS: * You need to be seen within 2 or 3 days. COUGH MEDICINES: * COUGH SYRUP WITH DEXTROMETHORPHAN: An over-the-counter cough syrup can help your cough. The most common cough suppressant in overthe-counter cough medicines is dextromethorphan. * HOME REMEDY - HONEY: This old home remedy has been shown to help decrease coughing at night. The adult dosage is 2 teaspoons (10 ml) at bedtime. CALL BACK IF: * Fever over 103 F (39.4 C) * Difficulty breathing occurs * You become worse CARE ADVICE given per Cough - Acute Non-Productive (Adult) guideline. Referrals [facility] [place]

## 2022-10-19 NOTE — Telephone Encounter (Signed)
Pt is scheduled today.

## 2022-10-19 NOTE — Progress Notes (Signed)
[place] at [facility] [address] [place], [place] [address] [phone]  Date:  10/19/2022   Name:  Emily Wood   DOB:  Dec 10, 1950   MRN:  [id number]  PCP:  Darreld Mclean, MD    Chief Complaint: URI (Cough, Phlem, Congestion)   History of Present Illness:  Emily Wood is a 72 y.o. very pleasant female patient who presents with the following:  Virtual visit today for concern of ilness  Pt location is home, my location is office Pt ID confirmed with 2 factors, she gives consent for a virtual visit today Most recent visit with myself was in February for follow-up DM under very good control  Lab Results  Component Value Date   HGBA1C 5.6 09/01/2022   She had shoulder surgery per Dr Stann Mainland on 2/23- she notes she is doing well in this regard and is doing home PT twice a week   Pt notes a "cough or bronchitis or whatever I had before, it's back now" Covid negative this am  She notes sx for a week now  She has a cough, chest congestion, stuffy nose,  She is using OTC cough and cold preps  She is bringing up some phlegm but it seems really thick and hard to bring up  No fever noted  She does feel cold but not necessarily having chills per se  No GI symptoms  Patient Active Problem List   Diagnosis Date Noted   Hypokalemia 05/23/2022   Normocytic anemia 05/23/2022   Eye pain, right 05/22/2022   AKI (acute kidney injury) 05/21/2022   Coronary artery disease involving native heart 03/06/2022   Tricuspid valve insufficiency 11/05/2021   Hypertension 11/05/2021   Snoring 11/05/2021   Daytime somnolence 11/05/2021   Type 2 diabetes mellitus with hyperosmolarity without coma, without long-term current use of insulin 11/05/2021   Coronary artery disease involving native coronary artery of native heart without angina pectoris 05/15/2021   Nonrheumatic tricuspid valve regurgitation 05/15/2021   Medication  management 05/15/2021   Pulmonary hypertension, unspecified 05/15/2021   Precordial pain 02/20/2021   Nonspecific abnormal electrocardiogram (ECG) (EKG) 02/20/2021   Mixed hyperlipidemia 02/20/2021   Obesity (BMI 30-39.9) 02/20/2021   Arthritis 02/18/2021   Diabetic retinopathy 08/28/2020   Mixed incontinence 05/31/2020   Urinary urgency 05/31/2020   Graves disease 04/14/2020   Right renal mass 11/22/2019   Arthritis of left knee 09/25/2019   Lymphedema 01/25/2017   Fibroid uterus 01/14/2017   Hypertensive heart disease without heart failure 10/01/2016   Chronic kidney disease, stage 3 09/21/2016   Meralgia paresthetica of left side 08/27/2015   Diabetes mellitus type 2, controlled 08/02/2015   Chronic venous insufficiency 06/28/2015   Essential hypertension 02/15/2015   Peripheral edema 02/15/2015   Chronic knee pain 02/15/2015    Past Medical History:  Diagnosis Date   Arthritis    Diabetes mellitus without complication ([place])    Family history of adverse reaction to anesthesia    PONV   Graves disease 04/14/2020   Hypertension    Lymphedema    Mixed hyperlipidemia 02/20/2021    Past Surgical History:  Procedure Laterality Date   CATARACT EXTRACTION W/PHACO Right 04/14/2022   Procedure: CATARACT EXTRACTION PHACO AND INTRAOCULAR LENS PLACEMENT (IOC) RIGHT DIABETIC 10.93 00:53.0;  Surgeon: Birder Robson, MD;  Location: [facility];  Service: Ophthalmology;  Laterality: Right;   CATARACT EXTRACTION W/PHACO Left 04/28/2022   Procedure: CATARACT EXTRACTION  PHACO AND INTRAOCULAR LENS PLACEMENT (IOC) LEFT DIABETIC 6.05 00:45.7;  Surgeon: Birder Robson, MD;  Location: [facility];  Service: Ophthalmology;  Laterality: Left;  Diabetic   CHOLECYSTECTOMY     HERNIA REPAIR     OTHER SURGICAL HISTORY     scar tissue removal from bowels   PARTIAL HYSTERECTOMY     Still has ovaries   REVERSE SHOULDER ARTHROPLASTY Right 09/11/2022   Procedure: REVERSE SHOULDER  ARTHROPLASTY;  Surgeon: Nicholes Stairs, MD;  Location: [facility];  Service: Orthopedics;  Laterality: Right;  120   UTERINE FIBROID SURGERY      Social History   Tobacco Use   Smoking status: Never   Smokeless tobacco: Never  Vaping Use   Vaping Use: Never used  Substance Use Topics   Alcohol use: No    Alcohol/week: 0.0 standard drinks of alcohol    Comment: rarely   Drug use: No    Family History  Problem Relation Age of Onset   Hyperlipidemia Mother    Cancer Sister    Hyperlipidemia Maternal Aunt    Diabetes Maternal Aunt    Breast cancer Cousin    Diabetes Maternal Uncle     No Known Allergies  Medication list has been reviewed and updated.  Current Outpatient Medications on File Prior to Visit  Medication Sig Dispense Refill   Accu-Chek Softclix Lancets lancets Check blood sugars once daily 100 each 12   Alpha-Lipoic Acid 300 MG CAPS Take 600 mg by mouth daily.     amLODipine (NORVASC) 5 MG tablet TAKE 1 AND 1/2 TABLETS BY MOUTH  DAILY 120 tablet 3   Ascorbic Acid (VITAMIN C) 1000 MG tablet Take 1,000 mg by mouth daily.     aspirin 81 MG tablet Take 81 mg by mouth daily.     atorvastatin (LIPITOR) 40 MG tablet TAKE 1 TABLET BY MOUTH DAILY 100 tablet 2   Biotin w/ Vitamins C & E (HAIR/SKIN/NAILS PO) Take 6,000 mcg by mouth daily.     carvedilol (COREG) 12.5 MG tablet TAKE 1 TABLET BY MOUTH TWICE  DAILY 200 tablet 2   Cholecalciferol (VITAMIN D3) 125 MCG (5000 UT) CAPS Take 1 capsule (5,000 Units total) by mouth daily.     cloNIDine (CATAPRES) 0.2 MG tablet TAKE 1 TABLET BY MOUTH 3 TIMES  DAILY 240 tablet 3   Cobalamin Combinations (B-12) [phone] MCG SUBL Place 5,000 mcg under the tongue daily.     diclofenac Sodium (VOLTAREN) 1 % GEL Apply 4 g topically 4 (four) times daily. (Patient taking differently: Apply 4 g topically daily as needed.) 100 g 0   ferrous sulfate 325 (65 FE) MG tablet Take 325 mg by mouth daily with breakfast.     gabapentin (NEURONTIN) 400  MG capsule TAKE 2 CAPSULES BY MOUTH 3 TIMES DAILY (Patient taking differently: Take 800 mg by mouth 3 (three) times daily.) 600 capsule 2   gatifloxacin (ZYMAXID) 0.5 % SOLN Place 1 drop into both eyes 4 (four) times daily.     GEMTESA 75 MG TABS Take 75 mg by mouth daily.     glucose blood (ACCU-CHEK GUIDE) test strip Check blood sugars once daily 100 strip 12   hydrochlorothiazide (HYDRODIURIL) 25 MG tablet TAKE 1 TABLET BY MOUTH DAILY 80 tablet 3   irbesartan (AVAPRO) 300 MG tablet Take 1 tablet (300 mg total) by mouth daily.     isosorbide mononitrate (IMDUR) 30 MG 24 hr tablet Take 1 tablet (30 mg total) by mouth  daily. 90 tablet 3   methimazole (TAPAZOLE) 5 MG tablet Take 2.5 mg by mouth daily.     methocarbamol (ROBAXIN) 500 MG tablet Take 1 tablet (500 mg total) by mouth every 8 (eight) hours as needed for muscle spasms. 30 tablet 0   Multiple Vitamin (MULTIVITAMIN ADULT PO) Take 2 tablets by mouth daily.     nystatin cream (MYCOSTATIN) Apply 1 application topically 2 (two) times daily. (Patient taking differently: Apply 1 application  topically 2 (two) times daily as needed for dry skin.) 30 g 1   ondansetron (ZOFRAN) 4 MG tablet Take 1 tablet (4 mg total) by mouth every 8 (eight) hours as needed for nausea or vomiting. 20 tablet 0   oxybutynin (DITROPAN-XL) 10 MG 24 hr tablet Take 10 mg by mouth at bedtime.     oxyCODONE (ROXICODONE) 5 MG immediate release tablet Take 1 tablet (5 mg total) by mouth every 6 (six) hours as needed for severe pain or breakthrough pain. 20 tablet 0   OZEMPIC, 0.25 OR 0.5 MG/DOSE, 2 MG/3ML SOPN Inject 0.5 mg into the skin once a week.     potassium chloride SA (KLOR-CON M) 20 MEQ tablet TAKE 1 TABLET BY MOUTH DAILY 80 tablet 3   prednisoLONE acetate (PRED FORTE) 1 % ophthalmic suspension Place 1 drop into both eyes 4 (four) times daily. (Patient taking differently: Place 1 drop into both eyes in the morning, at noon, and at bedtime.) 5 mL 0   torsemide (DEMADEX)  10 MG tablet TAKE 1 TABLET (10 MG TOTAL) BY MOUTH DAILY. USE AS NEEDED FOR LOWER EXTREMITY SWELLING 90 tablet 1   Turmeric 500 MG CAPS Take 500 mg by mouth daily.     No current facility-administered medications on file prior to visit.    Review of Systems:  As per HPI- otherwise negative.   Physical Examination: There were no vitals filed for this visit. There were no vitals filed for this visit. There is no height or weight on file to calculate BMI. Ideal Body Weight:    Patient observed over video monitor.  She looks well, no shortness of breath or distress is noted.  Occasional cough  Assessment and Plan: Bronchitis - Plan: cefdinir (OMNICEF) 300 MG capsule Patient seen today virtually for concern of bronchitis.  Video monitor used for duration of visit today We will treat her with cefdinir for possible bacterial bronchitis with developing sinusitis Pt asked to let me know if not getting better in 2-3 days, Sooner if worse.  She is being seen by [facility] this week  Signed [name], MD

## 2022-10-21 DIAGNOSIS — D631 Anemia in chronic kidney disease: Secondary | ICD-10-CM | POA: Diagnosis not present

## 2022-10-21 DIAGNOSIS — H409 Unspecified glaucoma: Secondary | ICD-10-CM | POA: Diagnosis not present

## 2022-10-21 DIAGNOSIS — Z7985 Long-term (current) use of injectable non-insulin antidiabetic drugs: Secondary | ICD-10-CM | POA: Diagnosis not present

## 2022-10-21 DIAGNOSIS — E782 Mixed hyperlipidemia: Secondary | ICD-10-CM | POA: Diagnosis not present

## 2022-10-21 DIAGNOSIS — I071 Rheumatic tricuspid insufficiency: Secondary | ICD-10-CM | POA: Diagnosis not present

## 2022-10-21 DIAGNOSIS — I872 Venous insufficiency (chronic) (peripheral): Secondary | ICD-10-CM | POA: Diagnosis not present

## 2022-10-21 DIAGNOSIS — Z96611 Presence of right artificial shoulder joint: Secondary | ICD-10-CM | POA: Diagnosis not present

## 2022-10-21 DIAGNOSIS — G8929 Other chronic pain: Secondary | ICD-10-CM | POA: Diagnosis not present

## 2022-10-21 DIAGNOSIS — I2510 Atherosclerotic heart disease of native coronary artery without angina pectoris: Secondary | ICD-10-CM | POA: Diagnosis not present

## 2022-10-21 DIAGNOSIS — E1122 Type 2 diabetes mellitus with diabetic chronic kidney disease: Secondary | ICD-10-CM | POA: Diagnosis not present

## 2022-10-21 DIAGNOSIS — M1712 Unilateral primary osteoarthritis, left knee: Secondary | ICD-10-CM | POA: Diagnosis not present

## 2022-10-21 DIAGNOSIS — E0500 Thyrotoxicosis with diffuse goiter without thyrotoxic crisis or storm: Secondary | ICD-10-CM | POA: Diagnosis not present

## 2022-10-21 DIAGNOSIS — I129 Hypertensive chronic kidney disease with stage 1 through stage 4 chronic kidney disease, or unspecified chronic kidney disease: Secondary | ICD-10-CM | POA: Diagnosis not present

## 2022-10-21 DIAGNOSIS — E11319 Type 2 diabetes mellitus with unspecified diabetic retinopathy without macular edema: Secondary | ICD-10-CM | POA: Diagnosis not present

## 2022-10-21 DIAGNOSIS — E1151 Type 2 diabetes mellitus with diabetic peripheral angiopathy without gangrene: Secondary | ICD-10-CM | POA: Diagnosis not present

## 2022-10-21 DIAGNOSIS — Z471 Aftercare following joint replacement surgery: Secondary | ICD-10-CM | POA: Diagnosis not present

## 2022-10-21 DIAGNOSIS — Z7982 Long term (current) use of aspirin: Secondary | ICD-10-CM | POA: Diagnosis not present

## 2022-10-21 DIAGNOSIS — N1831 Chronic kidney disease, stage 3a: Secondary | ICD-10-CM | POA: Diagnosis not present

## 2022-10-21 DIAGNOSIS — I890 Lymphedema, not elsewhere classified: Secondary | ICD-10-CM | POA: Diagnosis not present

## 2022-10-21 DIAGNOSIS — E1100 Type 2 diabetes mellitus with hyperosmolarity without nonketotic hyperglycemic-hyperosmolar coma (NKHHC): Secondary | ICD-10-CM | POA: Diagnosis not present

## 2022-10-21 DIAGNOSIS — I272 Pulmonary hypertension, unspecified: Secondary | ICD-10-CM | POA: Diagnosis not present

## 2022-10-22 DIAGNOSIS — I129 Hypertensive chronic kidney disease with stage 1 through stage 4 chronic kidney disease, or unspecified chronic kidney disease: Secondary | ICD-10-CM | POA: Diagnosis not present

## 2022-10-22 DIAGNOSIS — D631 Anemia in chronic kidney disease: Secondary | ICD-10-CM | POA: Diagnosis not present

## 2022-10-22 DIAGNOSIS — N2581 Secondary hyperparathyroidism of renal origin: Secondary | ICD-10-CM | POA: Diagnosis not present

## 2022-10-22 DIAGNOSIS — E871 Hypo-osmolality and hyponatremia: Secondary | ICD-10-CM | POA: Diagnosis not present

## 2022-10-22 DIAGNOSIS — N1832 Chronic kidney disease, stage 3b: Secondary | ICD-10-CM | POA: Diagnosis not present

## 2022-10-22 LAB — LAB REPORT - SCANNED: EGFR: 35

## 2022-10-24 NOTE — Progress Notes (Unsigned)
[facility] [address], [address] [place], [place] [address] [phone] [phone]  Date:  10/26/2022   Name:  Emily Wood Dunnigan   DOB:  03/13/1951   MRN:  [id number]  PCP:  Pearline Cablesopland, [name] C, MD    Chief Complaint: No chief complaint on file.   History of Present Illness:  Emily Wood Cavanagh is a 72 y.o. very pleasant female patient who presents with the following:  Pt seen today for follow-up History of diabetes, chronic kidney disease, hypertension, venous insufficiency and lymphedema, obesity, Graves' hyperthyroidism in remission    Most recent visit with myself was a virtual visit about a week ago for bronchitis Otherwise we last visited in February for preoperative clearance -she was planned to have a shoulder replacement but first need to follow-up for an unexpected bump in her creatinine Creatinine came back closer to her baseline at 1.6 and she was approved for surgery  She is also managed by nephrology- she was seen by Dr Margot AblesSinah in February   Shingrix Cologuard is now due for update Needs foot exam  Lab Results  Component Value Date   HGBA1C 5.6 09/01/2022     She is still taking the 10 days of cefdinir I gave her on April 1 Amlodipine Ozempic 0.5 Aspirin Lipitor Carvedilol Clonidine 0.2 3 times daily Gabapentin 800 3 times daily HCTZ 25 Torsemide 10 mg daily as needed Potassium 20 mEq daily irbersartan 300 Imdur 30 mg Tapazole 2.5 Gestasa for bladder   Her endocrinologist is [facility] [place]-she was seen in December Assessment 1. Graves' disease * Controlled. Continue methimazole 2.5 mg daily. 2. Graves Eye Disease  * She has minimal thyroid eye disease (TED) and likely does not need medical therapy.  3. Diabetes mellitus with stage 3a CKD and obesity * Diabetes is controlled and Hb A1c remains at target.  * Continue current regimen for her diabetes including Ozempic and  metformin.  * Counseled her to apply annually for patient assistance from [employer].  * Encouraged her to self monitor her blood sugars. Asked her to check sugars daily and bring her glucometer. * She declines a CGM. * Continue statin.  * Up to date on annual dilated eye exams, last was in 02/2022.   Most recent visit with cardiology was in August-she was started on Imdur for chest pain, continue aspirin and Lipitor Patient Active Problem List   Diagnosis Date Noted   Hypokalemia 05/23/2022   Normocytic anemia 05/23/2022   Eye pain, right 05/22/2022   AKI (acute kidney injury) 05/21/2022   Coronary artery disease involving native heart 03/06/2022   Tricuspid valve insufficiency 11/05/2021   Hypertension 11/05/2021   Snoring 11/05/2021   Daytime somnolence 11/05/2021   Type 2 diabetes mellitus with hyperosmolarity without coma, without long-term current use of insulin 11/05/2021   Coronary artery disease involving native coronary artery of native heart without angina pectoris 05/15/2021   Nonrheumatic tricuspid valve regurgitation 05/15/2021   Medication management 05/15/2021   Pulmonary hypertension, unspecified 05/15/2021   Precordial pain 02/20/2021   Nonspecific abnormal electrocardiogram (ECG) (EKG) 02/20/2021   Mixed hyperlipidemia 02/20/2021   Obesity (BMI 30-39.9) 02/20/2021   Arthritis 02/18/2021   Diabetic retinopathy 08/28/2020   Mixed incontinence 05/31/2020   Urinary urgency 05/31/2020   Graves disease 04/14/2020   Right renal mass 11/22/2019   Arthritis of left knee 09/25/2019   Lymphedema 01/25/2017   Fibroid uterus 01/14/2017  Hypertensive heart disease without heart failure 10/01/2016   Chronic kidney disease, stage 3 09/21/2016   Meralgia paresthetica of left side 08/27/2015   Diabetes mellitus type 2, controlled 08/02/2015   Chronic venous insufficiency 06/28/2015   Essential hypertension 02/15/2015   Peripheral edema 02/15/2015   Chronic knee pain  02/15/2015    Past Medical History:  Diagnosis Date   Arthritis    Diabetes mellitus without complication ([facility])    Family history of adverse reaction to anesthesia    PONV   Graves disease 04/14/2020   Hypertension    Lymphedema    Mixed hyperlipidemia 02/20/2021    Past Surgical History:  Procedure Laterality Date   CATARACT EXTRACTION W/PHACO Right 04/14/2022   Procedure: CATARACT EXTRACTION PHACO AND INTRAOCULAR LENS PLACEMENT (IOC) RIGHT DIABETIC 10.93 00:53.0;  Surgeon: Galen Manila, MD;  Location: [facility];  Service: Ophthalmology;  Laterality: Right;   CATARACT EXTRACTION W/PHACO Left 04/28/2022   Procedure: CATARACT EXTRACTION PHACO AND INTRAOCULAR LENS PLACEMENT (IOC) LEFT DIABETIC 6.05 00:45.7;  Surgeon: Galen Manila, MD;  Location[facility];  Service: Ophthalmology;  Laterality: Left;  Diabetic   CHOLECYSTECTOMY     HERNIA REPAIR     OTHER SURGICAL HISTORY     scar tissue removal from bowels   PARTIAL HYSTERECTOMY     Still has ovaries   REVERSE SHOULDER ARTHROPLASTY Right 09/11/2022   Procedure: REVERSE SHOULDER ARTHROPLASTY;  Surgeon: Yolonda Kida, MD;  Location: [facility];  Service: Orthopedics;  Laterality: Right;  120   UTERINE FIBROID SURGERY      Social History   Tobacco Use   Smoking status: Never   Smokeless tobacco: Never  Vaping Use   Vaping Use: Never used  Substance Use Topics   Alcohol use: No    Alcohol/week: 0.0 standard drinks of alcohol    Comment: rarely   Drug use: No    Family History  Problem Relation Age of Onset   Hyperlipidemia Mother    Cancer Sister    Hyperlipidemia Maternal Aunt    Diabetes Maternal Aunt    Breast cancer Cousin    Diabetes Maternal Uncle     No Known Allergies  Medication list has been reviewed and updated.  Current Outpatient Medications on File Prior to Visit  Medication Sig Dispense Refill   Accu-Chek Softclix Lancets lancets Check blood sugars once daily 100  each 12   Alpha-Lipoic Acid 300 MG CAPS Take 600 mg by mouth daily.     amLODipine (NORVASC) 5 MG tablet TAKE 1 AND 1/2 TABLETS BY MOUTH  DAILY 120 tablet 3   Ascorbic Acid (VITAMIN C) 1000 MG tablet Take 1,000 mg by mouth daily.     aspirin 81 MG tablet Take 81 mg by mouth daily.     atorvastatin (LIPITOR) 40 MG tablet TAKE 1 TABLET BY MOUTH DAILY 100 tablet 2   Biotin w/ Vitamins C & E (HAIR/SKIN/NAILS PO) Take 6,000 mcg by mouth daily.     carvedilol (COREG) 12.5 MG tablet TAKE 1 TABLET BY MOUTH TWICE  DAILY 200 tablet 2   cefdinir (OMNICEF) 300 MG capsule Take 1 capsule (300 mg total) by mouth 2 (two) times daily. 20 capsule 0   Cholecalciferol (VITAMIN D3) 125 MCG (5000 UT) CAPS Take 1 capsule (5,000 Units total) by mouth daily.     cloNIDine (CATAPRES) 0.2 MG tablet TAKE 1 TABLET BY MOUTH 3 TIMES  DAILY 240 tablet 3   Cobalamin Combinations (B-12) [phone] MCG SUBL Place  5,000 mcg under the tongue daily.     diclofenac Sodium (VOLTAREN) 1 % GEL Apply 4 Wood topically 4 (four) times daily. (Patient taking differently: Apply 4 Wood topically daily as needed.) 100 Wood 0   ferrous sulfate 325 (65 FE) MG tablet Take 325 mg by mouth daily with breakfast.     gabapentin (NEURONTIN) 400 MG capsule TAKE 2 CAPSULES BY MOUTH 3 TIMES DAILY (Patient taking differently: Take 800 mg by mouth 3 (three) times daily.) 600 capsule 2   gatifloxacin (ZYMAXID) 0.5 % SOLN Place 1 drop into both eyes 4 (four) times daily.     GEMTESA 75 MG TABS Take 75 mg by mouth daily.     glucose blood (ACCU-CHEK GUIDE) test strip Check blood sugars once daily 100 strip 12   hydrochlorothiazide (HYDRODIURIL) 25 MG tablet TAKE 1 TABLET BY MOUTH DAILY 80 tablet 3   irbesartan (AVAPRO) 300 MG tablet Take 1 tablet (300 mg total) by mouth daily.     isosorbide mononitrate (IMDUR) 30 MG 24 hr tablet Take 1 tablet (30 mg total) by mouth daily. 90 tablet 3   methimazole (TAPAZOLE) 5 MG tablet Take 2.5 mg by mouth daily.     methocarbamol  (ROBAXIN) 500 MG tablet Take 1 tablet (500 mg total) by mouth every 8 (eight) hours as needed for muscle spasms. 30 tablet 0   Multiple Vitamin (MULTIVITAMIN ADULT PO) Take 2 tablets by mouth daily.     nystatin cream (MYCOSTATIN) Apply 1 application topically 2 (two) times daily. (Patient taking differently: Apply 1 application  topically 2 (two) times daily as needed for dry skin.) 30 Wood 1   ondansetron (ZOFRAN) 4 MG tablet Take 1 tablet (4 mg total) by mouth every 8 (eight) hours as needed for nausea or vomiting. 20 tablet 0   oxybutynin (DITROPAN-XL) 10 MG 24 hr tablet Take 10 mg by mouth at bedtime.     oxyCODONE (ROXICODONE) 5 MG immediate release tablet Take 1 tablet (5 mg total) by mouth every 6 (six) hours as needed for severe pain or breakthrough pain. 20 tablet 0   OZEMPIC, 0.25 OR 0.5 MG/DOSE, 2 MG/3ML SOPN Inject 0.5 mg into the skin once a week.     potassium chloride SA (KLOR-CON M) 20 MEQ tablet TAKE 1 TABLET BY MOUTH DAILY 80 tablet 3   prednisoLONE acetate (PRED FORTE) 1 % ophthalmic suspension Place 1 drop into both eyes 4 (four) times daily. (Patient taking differently: Place 1 drop into both eyes in the morning, at noon, and at bedtime.) 5 mL 0   torsemide (DEMADEX) 10 MG tablet TAKE 1 TABLET (10 MG TOTAL) BY MOUTH DAILY. USE AS NEEDED FOR LOWER EXTREMITY SWELLING 90 tablet 1   Turmeric 500 MG CAPS Take 500 mg by mouth daily.     No current facility-administered medications on file prior to visit.    Review of Systems:  As per HPI- otherwise negative.   Physical Examination: There were no vitals filed for this visit. There were no vitals filed for this visit. There is no height or weight on file to calculate BMI. Ideal Body Weight:    GEN: no acute distress. HEENT: Atraumatic, Normocephalic.  Ears and Nose: No external deformity. CV: RRR, No M/Wood/R. No JVD. No thrill. No extra heart sounds. PULM: CTA B, no wheezes, crackles, rhonchi. No retractions. No resp. distress.  No accessory muscle use. ABD: S, NT, ND, +BS. No rebound. No HSM. EXTR: No c/c/e PSYCH: Normally interactive. Conversant.  Assessment and Plan: ***  Signed [name], MD

## 2022-10-24 NOTE — Patient Instructions (Incomplete)
It was great to see you again today, I would recommend getting the shingles vaccine series at your pharmacy if not done already  Please stop by imaging to get your chest x-ray; I will be in touch with this report I also made a referral to podiatry to help with your toenails and ordered a cologuard kit for you

## 2022-10-26 ENCOUNTER — Ambulatory Visit (HOSPITAL_BASED_OUTPATIENT_CLINIC_OR_DEPARTMENT_OTHER)
Admission: RE | Admit: 2022-10-26 | Discharge: 2022-10-26 | Disposition: A | Discharge status: Home / Self Care | Payer: Medicare Other | Source: Ambulatory Visit | Attending: Family Medicine | Admitting: Family Medicine

## 2022-10-26 ENCOUNTER — Encounter: Payer: Self-pay | Admitting: Family Medicine

## 2022-10-26 ENCOUNTER — Ambulatory Visit (INDEPENDENT_AMBULATORY_CARE_PROVIDER_SITE_OTHER): Payer: Medicare Other | Admitting: Family Medicine

## 2022-10-26 VITALS — BP 144/88 | HR 64 | Resp 18 | Ht 65.0 in | Wt 193.4 lb

## 2022-10-26 DIAGNOSIS — R052 Subacute cough: Secondary | ICD-10-CM | POA: Insufficient documentation

## 2022-10-26 DIAGNOSIS — I10 Essential (primary) hypertension: Secondary | ICD-10-CM | POA: Diagnosis not present

## 2022-10-26 DIAGNOSIS — M1712 Unilateral primary osteoarthritis, left knee: Secondary | ICD-10-CM | POA: Diagnosis not present

## 2022-10-26 DIAGNOSIS — E11319 Type 2 diabetes mellitus with unspecified diabetic retinopathy without macular edema: Secondary | ICD-10-CM | POA: Diagnosis not present

## 2022-10-26 DIAGNOSIS — R059 Cough, unspecified: Secondary | ICD-10-CM | POA: Diagnosis not present

## 2022-10-26 DIAGNOSIS — E1100 Type 2 diabetes mellitus with hyperosmolarity without nonketotic hyperglycemic-hyperosmolar coma (NKHHC): Secondary | ICD-10-CM | POA: Diagnosis not present

## 2022-10-26 DIAGNOSIS — N1831 Chronic kidney disease, stage 3a: Secondary | ICD-10-CM

## 2022-10-26 DIAGNOSIS — B351 Tinea unguium: Secondary | ICD-10-CM | POA: Diagnosis not present

## 2022-10-26 DIAGNOSIS — I272 Pulmonary hypertension, unspecified: Secondary | ICD-10-CM | POA: Diagnosis not present

## 2022-10-26 DIAGNOSIS — I071 Rheumatic tricuspid insufficiency: Secondary | ICD-10-CM | POA: Diagnosis not present

## 2022-10-26 DIAGNOSIS — Z1211 Encounter for screening for malignant neoplasm of colon: Secondary | ICD-10-CM | POA: Diagnosis not present

## 2022-10-26 DIAGNOSIS — Z7985 Long-term (current) use of injectable non-insulin antidiabetic drugs: Secondary | ICD-10-CM | POA: Diagnosis not present

## 2022-10-26 DIAGNOSIS — I2510 Atherosclerotic heart disease of native coronary artery without angina pectoris: Secondary | ICD-10-CM | POA: Diagnosis not present

## 2022-10-26 DIAGNOSIS — E119 Type 2 diabetes mellitus without complications: Secondary | ICD-10-CM

## 2022-10-26 DIAGNOSIS — R600 Localized edema: Secondary | ICD-10-CM | POA: Diagnosis not present

## 2022-10-26 DIAGNOSIS — Z7982 Long term (current) use of aspirin: Secondary | ICD-10-CM | POA: Diagnosis not present

## 2022-10-26 DIAGNOSIS — E782 Mixed hyperlipidemia: Secondary | ICD-10-CM | POA: Diagnosis not present

## 2022-10-26 DIAGNOSIS — G8929 Other chronic pain: Secondary | ICD-10-CM | POA: Diagnosis not present

## 2022-10-26 DIAGNOSIS — E0500 Thyrotoxicosis with diffuse goiter without thyrotoxic crisis or storm: Secondary | ICD-10-CM | POA: Diagnosis not present

## 2022-10-26 DIAGNOSIS — I890 Lymphedema, not elsewhere classified: Secondary | ICD-10-CM | POA: Diagnosis not present

## 2022-10-26 DIAGNOSIS — E1122 Type 2 diabetes mellitus with diabetic chronic kidney disease: Secondary | ICD-10-CM | POA: Diagnosis not present

## 2022-10-26 DIAGNOSIS — Z471 Aftercare following joint replacement surgery: Secondary | ICD-10-CM | POA: Diagnosis not present

## 2022-10-26 DIAGNOSIS — E1151 Type 2 diabetes mellitus with diabetic peripheral angiopathy without gangrene: Secondary | ICD-10-CM | POA: Diagnosis not present

## 2022-10-26 DIAGNOSIS — Z96611 Presence of right artificial shoulder joint: Secondary | ICD-10-CM | POA: Diagnosis not present

## 2022-10-26 DIAGNOSIS — D631 Anemia in chronic kidney disease: Secondary | ICD-10-CM | POA: Diagnosis not present

## 2022-10-26 DIAGNOSIS — I129 Hypertensive chronic kidney disease with stage 1 through stage 4 chronic kidney disease, or unspecified chronic kidney disease: Secondary | ICD-10-CM | POA: Diagnosis not present

## 2022-10-26 DIAGNOSIS — I872 Venous insufficiency (chronic) (peripheral): Secondary | ICD-10-CM | POA: Diagnosis not present

## 2022-10-26 DIAGNOSIS — H409 Unspecified glaucoma: Secondary | ICD-10-CM | POA: Diagnosis not present

## 2022-10-28 DIAGNOSIS — M1712 Unilateral primary osteoarthritis, left knee: Secondary | ICD-10-CM | POA: Diagnosis not present

## 2022-10-28 DIAGNOSIS — I872 Venous insufficiency (chronic) (peripheral): Secondary | ICD-10-CM | POA: Diagnosis not present

## 2022-10-28 DIAGNOSIS — E782 Mixed hyperlipidemia: Secondary | ICD-10-CM | POA: Diagnosis not present

## 2022-10-28 DIAGNOSIS — E1122 Type 2 diabetes mellitus with diabetic chronic kidney disease: Secondary | ICD-10-CM | POA: Diagnosis not present

## 2022-10-28 DIAGNOSIS — Z96611 Presence of right artificial shoulder joint: Secondary | ICD-10-CM | POA: Diagnosis not present

## 2022-10-28 DIAGNOSIS — I272 Pulmonary hypertension, unspecified: Secondary | ICD-10-CM | POA: Diagnosis not present

## 2022-10-28 DIAGNOSIS — I071 Rheumatic tricuspid insufficiency: Secondary | ICD-10-CM | POA: Diagnosis not present

## 2022-10-28 DIAGNOSIS — N1831 Chronic kidney disease, stage 3a: Secondary | ICD-10-CM | POA: Diagnosis not present

## 2022-10-28 DIAGNOSIS — I129 Hypertensive chronic kidney disease with stage 1 through stage 4 chronic kidney disease, or unspecified chronic kidney disease: Secondary | ICD-10-CM | POA: Diagnosis not present

## 2022-10-28 DIAGNOSIS — Z7982 Long term (current) use of aspirin: Secondary | ICD-10-CM | POA: Diagnosis not present

## 2022-10-28 DIAGNOSIS — D631 Anemia in chronic kidney disease: Secondary | ICD-10-CM | POA: Diagnosis not present

## 2022-10-28 DIAGNOSIS — I890 Lymphedema, not elsewhere classified: Secondary | ICD-10-CM | POA: Diagnosis not present

## 2022-10-28 DIAGNOSIS — Z7985 Long-term (current) use of injectable non-insulin antidiabetic drugs: Secondary | ICD-10-CM | POA: Diagnosis not present

## 2022-10-28 DIAGNOSIS — E1100 Type 2 diabetes mellitus with hyperosmolarity without nonketotic hyperglycemic-hyperosmolar coma (NKHHC): Secondary | ICD-10-CM | POA: Diagnosis not present

## 2022-10-28 DIAGNOSIS — I2510 Atherosclerotic heart disease of native coronary artery without angina pectoris: Secondary | ICD-10-CM | POA: Diagnosis not present

## 2022-10-28 DIAGNOSIS — G8929 Other chronic pain: Secondary | ICD-10-CM | POA: Diagnosis not present

## 2022-10-28 DIAGNOSIS — E1151 Type 2 diabetes mellitus with diabetic peripheral angiopathy without gangrene: Secondary | ICD-10-CM | POA: Diagnosis not present

## 2022-10-28 DIAGNOSIS — E0500 Thyrotoxicosis with diffuse goiter without thyrotoxic crisis or storm: Secondary | ICD-10-CM | POA: Diagnosis not present

## 2022-10-28 DIAGNOSIS — E11319 Type 2 diabetes mellitus with unspecified diabetic retinopathy without macular edema: Secondary | ICD-10-CM | POA: Diagnosis not present

## 2022-10-28 DIAGNOSIS — Z471 Aftercare following joint replacement surgery: Secondary | ICD-10-CM | POA: Diagnosis not present

## 2022-10-28 DIAGNOSIS — H409 Unspecified glaucoma: Secondary | ICD-10-CM | POA: Diagnosis not present

## 2022-11-02 DIAGNOSIS — Z471 Aftercare following joint replacement surgery: Secondary | ICD-10-CM | POA: Diagnosis not present

## 2022-11-02 DIAGNOSIS — E1151 Type 2 diabetes mellitus with diabetic peripheral angiopathy without gangrene: Secondary | ICD-10-CM | POA: Diagnosis not present

## 2022-11-02 DIAGNOSIS — M1712 Unilateral primary osteoarthritis, left knee: Secondary | ICD-10-CM | POA: Diagnosis not present

## 2022-11-02 DIAGNOSIS — I872 Venous insufficiency (chronic) (peripheral): Secondary | ICD-10-CM | POA: Diagnosis not present

## 2022-11-02 DIAGNOSIS — I890 Lymphedema, not elsewhere classified: Secondary | ICD-10-CM | POA: Diagnosis not present

## 2022-11-02 DIAGNOSIS — I272 Pulmonary hypertension, unspecified: Secondary | ICD-10-CM | POA: Diagnosis not present

## 2022-11-02 DIAGNOSIS — Z7982 Long term (current) use of aspirin: Secondary | ICD-10-CM | POA: Diagnosis not present

## 2022-11-02 DIAGNOSIS — G8929 Other chronic pain: Secondary | ICD-10-CM | POA: Diagnosis not present

## 2022-11-02 DIAGNOSIS — E1122 Type 2 diabetes mellitus with diabetic chronic kidney disease: Secondary | ICD-10-CM | POA: Diagnosis not present

## 2022-11-02 DIAGNOSIS — Z7985 Long-term (current) use of injectable non-insulin antidiabetic drugs: Secondary | ICD-10-CM | POA: Diagnosis not present

## 2022-11-02 DIAGNOSIS — I2510 Atherosclerotic heart disease of native coronary artery without angina pectoris: Secondary | ICD-10-CM | POA: Diagnosis not present

## 2022-11-02 DIAGNOSIS — E782 Mixed hyperlipidemia: Secondary | ICD-10-CM | POA: Diagnosis not present

## 2022-11-02 DIAGNOSIS — D631 Anemia in chronic kidney disease: Secondary | ICD-10-CM | POA: Diagnosis not present

## 2022-11-02 DIAGNOSIS — N1831 Chronic kidney disease, stage 3a: Secondary | ICD-10-CM | POA: Diagnosis not present

## 2022-11-02 DIAGNOSIS — I071 Rheumatic tricuspid insufficiency: Secondary | ICD-10-CM | POA: Diagnosis not present

## 2022-11-02 DIAGNOSIS — E11319 Type 2 diabetes mellitus with unspecified diabetic retinopathy without macular edema: Secondary | ICD-10-CM | POA: Diagnosis not present

## 2022-11-02 DIAGNOSIS — H409 Unspecified glaucoma: Secondary | ICD-10-CM | POA: Diagnosis not present

## 2022-11-02 DIAGNOSIS — I129 Hypertensive chronic kidney disease with stage 1 through stage 4 chronic kidney disease, or unspecified chronic kidney disease: Secondary | ICD-10-CM | POA: Diagnosis not present

## 2022-11-02 DIAGNOSIS — E0500 Thyrotoxicosis with diffuse goiter without thyrotoxic crisis or storm: Secondary | ICD-10-CM | POA: Diagnosis not present

## 2022-11-02 DIAGNOSIS — E1100 Type 2 diabetes mellitus with hyperosmolarity without nonketotic hyperglycemic-hyperosmolar coma (NKHHC): Secondary | ICD-10-CM | POA: Diagnosis not present

## 2022-11-02 DIAGNOSIS — Z96611 Presence of right artificial shoulder joint: Secondary | ICD-10-CM | POA: Diagnosis not present

## 2022-11-03 ENCOUNTER — Encounter: Payer: Self-pay | Admitting: Occupational Therapy

## 2022-11-03 ENCOUNTER — Ambulatory Visit: Payer: Medicare Other | Attending: Nurse Practitioner | Admitting: Occupational Therapy

## 2022-11-03 DIAGNOSIS — I890 Lymphedema, not elsewhere classified: Secondary | ICD-10-CM | POA: Diagnosis not present

## 2022-11-03 NOTE — Therapy (Unsigned)
OUTPATIENT PHYSICAL THERAPY LOWER EXTREMITY LYMPHEDEMA EVALUATION  Patient Name: Emily Wood MRN: [id number] DOB:1950-08-22, 72 y.o., female Today's Date: 11/03/2022  END OF SESSION:   OT End of Session - 11/03/22 0823     Visit Number 1    Number of Visits 36    Date for OT Re-Evaluation 02/01/23    OT Start Time 0817    OT Stop Time 0915    OT Time Calculation (min) 58 min    Activity Tolerance Patient tolerated treatment well;No increased pain   Eval limited by Pt arriving late   Behavior During Therapy [facility] for tasks assessed/performed               Past Medical History:  Diagnosis Date   Arthritis    Diabetes mellitus without complication    Family history of adverse reaction to anesthesia    PONV   Graves disease 04/14/2020   Hypertension    Lymphedema    Mixed hyperlipidemia 02/20/2021   Past Surgical History:  Procedure Laterality Date   CATARACT EXTRACTION W/PHACO Right 04/14/2022   Procedure: CATARACT EXTRACTION PHACO AND INTRAOCULAR LENS PLACEMENT (IOC) RIGHT DIABETIC 10.93 00:53.0;  Surgeon: Galen Manila, MD;  Location: [facility];  Service: Ophthalmology;  Laterality: Right;   CATARACT EXTRACTION W/PHACO Left 04/28/2022   Procedure: CATARACT EXTRACTION PHACO AND INTRAOCULAR LENS PLACEMENT (IOC) LEFT DIABETIC 6.05 00:45.7;  Surgeon: Galen Manila, MD;  Location: [facility];  Service: Ophthalmology;  Laterality: Left;  Diabetic   CHOLECYSTECTOMY     HERNIA REPAIR     OTHER SURGICAL HISTORY     scar tissue removal from bowels   PARTIAL HYSTERECTOMY     Still has ovaries   REVERSE SHOULDER ARTHROPLASTY Right 09/11/2022   Procedure: REVERSE SHOULDER ARTHROPLASTY;  Surgeon: Yolonda Kida, MD;  Location: [facility];  Service: Orthopedics;  Laterality: Right;  120   UTERINE FIBROID SURGERY     Patient Active Problem List   Diagnosis Date Noted   Normocytic anemia 05/23/2022   AKI (acute kidney injury) 05/21/2022    Snoring 11/05/2021   Daytime somnolence 11/05/2021   Coronary artery disease involving native coronary artery of native heart without angina pectoris 05/15/2021   Nonrheumatic tricuspid valve regurgitation 05/15/2021   Pulmonary hypertension, unspecified 05/15/2021   Mixed hyperlipidemia 02/20/2021   Obesity (BMI 30-39.9) 02/20/2021   Diabetic retinopathy 08/28/2020   Mixed incontinence 05/31/2020   Graves disease 04/14/2020   Right renal mass 11/22/2019   Arthritis of left knee 09/25/2019   Lymphedema 01/25/2017   Fibroid uterus 01/14/2017   Chronic kidney disease, stage 3 09/21/2016   Meralgia paresthetica of left side 08/27/2015   Diabetes mellitus type 2, controlled 08/02/2015   Chronic venous insufficiency 06/28/2015   Essential hypertension 02/15/2015   Peripheral edema 02/15/2015   Chronic knee pain 02/15/2015    PCP: Pearline Cables, MD  REFERRING PROVIDER: Sheppard Plumber, NP  REFERRING DIAG: I89.0  THERAPY DIAG:  Lymphedema, not elsewhere classified  Rationale for Evaluation and Treatment: Rehabilitation  ONSET DATE: insideous onset >30 years  SUBJECTIVE:  SUBJECTIVE STATEMENT: Emily Wood is referred to Occupational Therapy by Sheppard Plumber, NP, for evaluation and treatment of BLE lymphedema. Pt reports onset of bilateral leg swelling > 30 years ago when she was a [place] adult . Pt reports her mother and aunt, both of whom worked on their feet most of the time, boith had leg swelling, "but it wasn't any bog deal above normal leg swelling". Pt reports leg swelling has wordsened over time, and worsens as the day goes on. She wears knee high , off the shelf, compression socks always, but they are quite difficult for her to getr  on and off herself since she recently had a R rotator  cuff repair. Pt's goals for therapy are to get the swelling under control so she can feel better, move better and look better.  PERTINENT HISTORY:  Medical Hx contributing to, or affected by chronic, progressiove lymphedema: Norvasc prescription-common side effect is leg swelling; HTN, CVI, Obesity, CAD, OA knees, CKD stage 3, DM  PAIN:  Are you having pain? Yes: NPRS scale: 5/10 Pain location: B legs Pain description: throbbing, tight, heavy Aggravating factors: standing , walking,  Relieving factors: medication from cardiologist,  elevation, compression stockings ( can don them herself w extra time. Recovering from recent R rotator cuff repair!)  PRECAUTIONS: Fall and Other: LYMPHEDEMA PRECAUTIONS: for CKD, CAD and DM skin   WEIGHT BEARING RESTRICTIONS: No  FALLS:  Has patient fallen in last 6 months? Yes. Number of falls 1; Oct 2024 fell asleep in chair in kitchen and fell out of chair  LIVING ENVIRONMENT: Lives with: lives with their spouse Lives in: House/apartment Stairs: Yes; Internal: 3 steps inside; 3 steps outside Has following equipment at home: Single point cane, shower chair, and Grab bars  OCCUPATION: retired from medical billing  LEISURE: largely sedentary-no formal exercise regime; doll collecting, 2 dogs, Harrison and [place] maltipoos  HAND DOMINANCE: right   PRIOR LEVEL OF FUNCTION: Requires assistive device for independence, Needs assistance with homemaking, and Needs assistance with gait  PATIENT GOALS: ".... to get this lymphedema under control. It's embarrassing. That's why I wear long skirts."   OBJECTIVE:  COGNITION:  Overall cognitive status: Within functional limits for tasks assessed   OBSERVATIONS / OTHER ASSESSMENTS:  Mild, Stage  II, Bilateral Lower Extremity Lymphedema 2/2 CVI and Obesity  Skin  Description Hyper-Keratosis Peau' de Orange Shiny Tight Fibrotic/ Indurated Fatty Doughy Spongy/ boggy       R>L x  x   Skin dry Flaky WNL  Macerated   mildly      Color Redness Varicosities Blanching Hemosiderin Stain Mottled   x     x   Odor Malodorous Yeast Fungal infection  WNL      x   Temperature Warm Cool wnl    x     Pitting Edema   1+ 2+ 3+ 4+ Non-pitting         x   Girth Symmetrical Asymmetrical                   Distribution    R>L toes to groin    Stemmer Sign Positive Negative   +    Lymphorrhea History Of:  Present Absent     x    Wounds History Of Present Absent Venous Arterial Pressure Sheer     x        Signs of Infection Redness Warmth Erythema Acute Swelling Drainage Borders  Sensation Light Touch Deep pressure Hypersensitivty   Present Impaired Present Impaired Absent Impaired   x Tactile  x  x     Nails WNL   Fungus nail dystrophy   x     Hair Growth Symmetrical Asymmetrical   x    Skin Creases Base of toes  Ankles   Base of Fingers knees       Abdominal pannus Thigh Lobules  Face/neck   x x  x        BLE COMPARATIVE LIMB VOLUMETRICS  LANDMARK RIGHT  03/11/22  R LEG (A-D) N/A  R THIGH (E-G) ml  R FULL LIMB (A-G) ml  Limb Volume differential (LVD)  %  Volume change since initial %  Volume change overall V  (Blank rows = not tested)  LANDMARK LEFT  03/11/22  R LEG (A-D) N/A  R THIGH (E-G) ml  R FULL LIMB (A-G) ml  Limb Volume differential (LVD)  %  Volume change since initial %  Volume change overall %  (Blank rows = not tested)  POSTURE:   LE ROM: limited mildly at ankles knees and hips 2/2 body habitus and skin approximation  LE Muscle Strength: WFL  GAIT: Distance walked: 200' Assistive device utilized: Single point cane Level of assistance: Modified independence Comments: extra time-slow pace  LYMPHEDEMA LIFE IMPACT SCALE (LLIS):Intake 11/03/22:  (The extent to which lymphedema related problems affected tyour life in the last week)  FOTO outcome measure: Intake 11/03/22: TBA  TODAY'S TREATMENT:                                                                                                                                          DATE: ***  PATIENT EDUCATION:  Education details: Provided Pt/ family education regarding lymphatic structure and function, lymphedema etiology, onset patterns and stages of progression. Taught interaction between blood circulatory system and lymphatic circulation.Discussed  impact of gravity and co-morbidities on lymphatic function. Outlined Complete Decongestive Therapy (CDT)  as standard of care and provided in depth information regarding 4 primary components of Intensive and Self Management Phases, including Manual Lymph Drainage (MLD), compression wrapping and garments, skin care, and therapeutic exercise. Homero Fellers discussion with re need for frequent attendance and high burden of care when caregiver is needed, impact of co morbidities. We discussed  the chronic, progressive nature of lymphedema and Importance of daily, ongoing LE self-care essential for limiting progression and infection risk. Discussed the demanding nature of CDT, high burden of care between visits and throughout self-management phase,  and importance of high level of compliance with all home program components for optimal outcome.  Person educated: Patient Education method: Explanation, Demonstration, Tactile cues, Verbal cues, and Handouts Education comprehension: verbalized understanding, returned demonstration, and needs further education  HOME EXERCISE PROGRAM: Intensive Phase Complete Decongestive Therapy (CDT) includes Manual lymphatic drainage- daily Skin care-daily to limit infection risk and improvce  flexibility and excursion Daily, BLE lymphatic pumping therex- seated or supine Daily (23/7) multilayer comrpession bandages below the knee to reduce limb volume, one limb at a time Self-management Phase CDT includes: Appropriate daytime compression garments and HOS devices LE self care home program- all above in  Intensive except for bandaging  ASSESSMENT:  CLINICAL IMPRESSION: Patient is a *** y.o. *** who was seen today for physical therapy evaluation and treatment for ***.   OBJECTIVE IMPAIRMENTS: {opptimpairments:25111}.   ACTIVITY LIMITATIONS: {activitylimitations:27494}  PARTICIPATION LIMITATIONS: meal prep, cleaning, driving, shopping, community activity, occupation, and yard work  PERSONAL FACTORS: {Personal factors:25162} are also affecting patient's functional outcome.   REHAB POTENTIAL: {rehabpotential:25112}  CLINICAL DECISION MAKING: {clinical decision making:25114}  EVALUATION COMPLEXITY: {Evaluation complexity:25115}   GOALS: Goals reviewed with patient? {yes/no:20286}  SHORT TERM GOALS: Target date: ***  *** Baseline: Goal status: {GOALSTATUS:25110}  2.  *** Baseline:  Goal status: {GOALSTATUS:25110}  3.  *** Baseline:  Goal status: {GOALSTATUS:25110}  4.  *** Baseline:  Goal status: {GOALSTATUS:25110}  5.  *** Baseline:  Goal status: {GOALSTATUS:25110}  6.  *** Baseline:  Goal status: {GOALSTATUS:25110}  LONG TERM GOALS: Target date: ***  *** Baseline:  Goal status: {GOALSTATUS:25110}  2.  *** Baseline:  Goal status: {GOALSTATUS:25110}  3.  *** Baseline:  Goal status: {GOALSTATUS:25110}  4.  *** Baseline:  Goal status: {GOALSTATUS:25110}  5.  *** Baseline:  Goal status: {GOALSTATUS:25110}  6.  *** Baseline:  Goal status: {GOALSTATUS:25110}   PLAN:  PT FREQUENCY: 2x/week  PT DURATION: 2 weeks  PLANNED INTERVENTIONS: Therapeutic exercises, Therapeutic activity, Patient/Family education, Self Care, Manual lymph drainage, Compression bandaging, scar mobilization, Manual therapy, and skin care throughout MLD  PLAN FOR NEXT SESSION:  BLE comparative limb volumetrics FOTO assessment Knee length compression wraps to LLE below the knee  Loel Dubonnet, MS, OTR/L, CLT-LANA 11/03/22 3:51 PM

## 2022-11-04 DIAGNOSIS — E11319 Type 2 diabetes mellitus with unspecified diabetic retinopathy without macular edema: Secondary | ICD-10-CM | POA: Diagnosis not present

## 2022-11-04 DIAGNOSIS — D631 Anemia in chronic kidney disease: Secondary | ICD-10-CM | POA: Diagnosis not present

## 2022-11-04 DIAGNOSIS — E0500 Thyrotoxicosis with diffuse goiter without thyrotoxic crisis or storm: Secondary | ICD-10-CM | POA: Diagnosis not present

## 2022-11-04 DIAGNOSIS — I272 Pulmonary hypertension, unspecified: Secondary | ICD-10-CM | POA: Diagnosis not present

## 2022-11-04 DIAGNOSIS — I890 Lymphedema, not elsewhere classified: Secondary | ICD-10-CM | POA: Diagnosis not present

## 2022-11-04 DIAGNOSIS — Z7985 Long-term (current) use of injectable non-insulin antidiabetic drugs: Secondary | ICD-10-CM | POA: Diagnosis not present

## 2022-11-04 DIAGNOSIS — G8929 Other chronic pain: Secondary | ICD-10-CM | POA: Diagnosis not present

## 2022-11-04 DIAGNOSIS — E1100 Type 2 diabetes mellitus with hyperosmolarity without nonketotic hyperglycemic-hyperosmolar coma (NKHHC): Secondary | ICD-10-CM | POA: Diagnosis not present

## 2022-11-04 DIAGNOSIS — I2510 Atherosclerotic heart disease of native coronary artery without angina pectoris: Secondary | ICD-10-CM | POA: Diagnosis not present

## 2022-11-04 DIAGNOSIS — M1712 Unilateral primary osteoarthritis, left knee: Secondary | ICD-10-CM | POA: Diagnosis not present

## 2022-11-04 DIAGNOSIS — E1151 Type 2 diabetes mellitus with diabetic peripheral angiopathy without gangrene: Secondary | ICD-10-CM | POA: Diagnosis not present

## 2022-11-04 DIAGNOSIS — Z96611 Presence of right artificial shoulder joint: Secondary | ICD-10-CM | POA: Diagnosis not present

## 2022-11-04 DIAGNOSIS — I071 Rheumatic tricuspid insufficiency: Secondary | ICD-10-CM | POA: Diagnosis not present

## 2022-11-04 DIAGNOSIS — Z471 Aftercare following joint replacement surgery: Secondary | ICD-10-CM | POA: Diagnosis not present

## 2022-11-04 DIAGNOSIS — I872 Venous insufficiency (chronic) (peripheral): Secondary | ICD-10-CM | POA: Diagnosis not present

## 2022-11-04 DIAGNOSIS — N1831 Chronic kidney disease, stage 3a: Secondary | ICD-10-CM | POA: Diagnosis not present

## 2022-11-04 DIAGNOSIS — I129 Hypertensive chronic kidney disease with stage 1 through stage 4 chronic kidney disease, or unspecified chronic kidney disease: Secondary | ICD-10-CM | POA: Diagnosis not present

## 2022-11-04 DIAGNOSIS — E1122 Type 2 diabetes mellitus with diabetic chronic kidney disease: Secondary | ICD-10-CM | POA: Diagnosis not present

## 2022-11-04 DIAGNOSIS — E782 Mixed hyperlipidemia: Secondary | ICD-10-CM | POA: Diagnosis not present

## 2022-11-04 DIAGNOSIS — Z7982 Long term (current) use of aspirin: Secondary | ICD-10-CM | POA: Diagnosis not present

## 2022-11-04 DIAGNOSIS — H409 Unspecified glaucoma: Secondary | ICD-10-CM | POA: Diagnosis not present

## 2022-11-05 ENCOUNTER — Ambulatory Visit: Payer: Medicare Other | Admitting: Podiatry

## 2022-11-05 DIAGNOSIS — E113311 Type 2 diabetes mellitus with moderate nonproliferative diabetic retinopathy with macular edema, right eye: Secondary | ICD-10-CM | POA: Diagnosis not present

## 2022-11-09 ENCOUNTER — Ambulatory Visit: Payer: Medicare Other | Admitting: Occupational Therapy

## 2022-11-09 ENCOUNTER — Encounter: Payer: Self-pay | Admitting: Occupational Therapy

## 2022-11-09 DIAGNOSIS — I890 Lymphedema, not elsewhere classified: Secondary | ICD-10-CM

## 2022-11-09 NOTE — Therapy (Signed)
OUTPATIENT OCCUPATIONAL THERAPY TREATMENT NOTE LOWER EXTREMITY LYMPHEDEMA Patient Name: Emily Wood MRN: [id number] DOB:[birth year], 72 y.o., female Today's Date: 11/09/2022  END OF SESSION:   OT End of Session - 11/09/22 0919     Visit Number 2    Number of Visits 36    Date for OT Re-Evaluation 02/01/23    OT Start Time 0910    OT Stop Time 1005    OT Time Calculation (min) 55 min    Activity Tolerance Patient tolerated treatment well;No increased pain   Eval limited by Pt arriving late   Behavior During Therapy [facility] for tasks assessed/performed               Past Medical History:  Diagnosis Date   Arthritis    Diabetes mellitus without complication    Family history of adverse reaction to anesthesia    PONV   Graves disease 04/14/2020   Hypertension    Lymphedema    Mixed hyperlipidemia 02/20/2021   Past Surgical History:  Procedure Laterality Date   CATARACT EXTRACTION W/PHACO Right 04/14/2022   Procedure: CATARACT EXTRACTION PHACO AND INTRAOCULAR LENS PLACEMENT (IOC) RIGHT DIABETIC 10.93 00:53.0;  Surgeon: Galen Manila, MD;  Location: [facility];  Service: Ophthalmology;  Laterality: Right;   CATARACT EXTRACTION W/PHACO Left 04/28/2022   Procedure: CATARACT EXTRACTION PHACO AND INTRAOCULAR LENS PLACEMENT (IOC) LEFT DIABETIC 6.05 00:45.7;  Surgeon: Galen Manila, MD;  Location: [facility];  Service: Ophthalmology;  Laterality: Left;  Diabetic   CHOLECYSTECTOMY     HERNIA REPAIR     OTHER SURGICAL HISTORY     scar tissue removal from bowels   PARTIAL HYSTERECTOMY     Still has ovaries   REVERSE SHOULDER ARTHROPLASTY Right 09/11/2022   Procedure: REVERSE SHOULDER ARTHROPLASTY;  Surgeon: Yolonda Kida, MD;  Location: [facility];  Service: Orthopedics;  Laterality: Right;  120   UTERINE FIBROID SURGERY     Patient Active Problem List   Diagnosis Date Noted   Normocytic anemia 05/23/2022   AKI (acute kidney injury) 05/21/2022    Snoring 11/05/2021   Daytime somnolence 11/05/2021   Coronary artery disease involving native coronary artery of native heart without angina pectoris 05/15/2021   Nonrheumatic tricuspid valve regurgitation 05/15/2021   Pulmonary hypertension, unspecified 05/15/2021   Mixed hyperlipidemia 02/20/2021   Obesity (BMI 30-39.9) 02/20/2021   Diabetic retinopathy 08/28/2020   Mixed incontinence 05/31/2020   Graves disease 04/14/2020   Right renal mass 11/22/2019   Arthritis of left knee 09/25/2019   Lymphedema 01/25/2017   Fibroid uterus 01/14/2017   Chronic kidney disease, stage 3 09/21/2016   Meralgia paresthetica of left side 08/27/2015   Diabetes mellitus type 2, controlled 08/02/2015   Chronic venous insufficiency 06/28/2015   Essential hypertension 02/15/2015   Peripheral edema 02/15/2015   Chronic knee pain 02/15/2015    PCP: Pearline Cables, MD  REFERRING PROVIDER: Sheppard Plumber, NP  REFERRING DIAG: I89.0  THERAPY DIAG:  Lymphedema, not elsewhere classified  Rationale for Evaluation and Treatment: Rehabilitation  ONSET DATE: insidious onset >30 years  SUBJECTIVE:  SUBJECTIVE STATEMENT: Emily Wood presents to Occupational Therapy for treatment of BLE lymphedema. Pt drove herself to clinic from her home in [place], and she is unaccompanied today. Pt rates BLE lymphedema (LE)-related pain as 5/10. She rates OA-related bilateral knee pain as 5/10 also. Pt states she has no new complaints since her initial evaluation last week.  PERTINENT HISTORY:  Medical Hx contributing to, or affected by chronic, progressive lymphedema: Norvasc prescription-common side effect is leg swelling; HTN, CVI, Obesity, CAD, OA knees, CKD stage 3, Graves Disease, DM  PAIN:  Are you having pain? Yes: B  knees NPRS scale: 5/10; yes , LE-related B legs 5/10 Pain description: throbbing, tight, heavy Aggravating factors: standing , walking,  Relieving factors: "The medication from my cardiologist", elevation, compression stockings ( Pt has good hip AROM bilaterally and is able to don them herself w extra time in clinic.)  PRECAUTIONS: Fall and Other: LYMPHEDEMA PRECAUTIONS: for CKD, CAD and DM skin   WEIGHT BEARING RESTRICTIONS: No  FALLS:  Has patient fallen since last visit? NO    HAND DOMINANCE: right   PRIOR LEVEL OF FUNCTION: Requires assistive device for independence, Needs assistance with homemaking, and Needs assistance with gait  PATIENT GOALS: ".... to get this lymphedema under control. It's embarrassing. That's why I wear long skirts."   OBJECTIVE:   Mild, Stage  II, Bilateral Lower Extremity Lymphedema 2/2 CVI and Obesity  OBSERVATIONS / OTHER ASSESSMENTS:   Lymphedema Life Impact Scape (LLIS): Intake 11/03/22: 50% (The extent to which L:E-related problems affected your life during the past week)  FOTO functional outcome scale: TBA next session 2/2 time constraints  BLE COMPARATIVE LIMB VOLUMETRICS  11/11/22 Intake: LIMB VOLUME DIFFERENTIAL TBA next visit along with LLE 2/2 time constraints  LANDMARK RIGHT   R LEG (A-D) 5043.4 ml  R THIGH (E-G) 4182.3 ml  R FULL LIMB (A-G) 7643.8 ml  Limb Volume differential (LVD)   TBA next visit%  Volume change since initial %  Volume change overall V  (Blank rows = not tested)  LANDMARK LEFT    R LEG (A-D) N/A  R THIGH (E-G) ml  R FULL LIMB (A-G) ml  Limb Volume differential (LVD)  %  Volume change since initial %  Volume change overall %  (Blank rows = not tested)   TODAY'S TREATMENT:  OT Initial Evaluation                                                                                                                                        PATIENT EDUCATION:  Continued Pt/ CG edu for lymphedema self care home program  throughout session. Topics include outcome of comparative limb volumetrics- starting limb volume differentials (LVDs), technology and gradient techniques used for short stretch, multilayer compression wrapping, simple self-MLD, therapeutic lymphatic pumping exercises, skin/nail care, LE precautions,. compression garment recommendations and specifications, wear and care schedule and compression garment donning /  doffing w assistive devices. Discussed progress towards all OT goals since commencing CDT. All questions answered to the Pt's satisfaction. Good return. Person educated: Patient Education method: Explanation, Demonstration, Tactile cues, Verbal cues, and Handouts Education comprehension: verbalized understanding, returned demonstration, and needs further education  HOME EXERCISE PROGRAM: Intensive Phase Complete Decongestive Therapy (CDT) includes Daily Manual lymphatic drainage, "simple self-MLD) Daily Skin care w/ low ph lotion matching skin ph to limit infection risk and improve tissue flexibility and excursion Daily, BLE lymphatic pumping therex- seated or supine Daily (23/7) multilayer compression bandages below the knee to reduce limb volume, one limb at a time  Self-management Phase CDT includes: All of the above and Appropriate daytime compression garments and HOS devices full time daily. Replace q 3-6 months  Custom-made gradient compression garments and HOS devices are medically necessary in this case because they are uniquely sized and shaped to fit the exact dimensions of the affected extremities with deformities, and to provide accurate and consistent gradient compression and containment, essential to optimally managing this patient's symptoms of chronic, progressive lymphedema. Multiple custom compression garments are needed for optimal hygiene to limit infection risk. Custom compression garments should be replaced q 3-6 months When worn consistently for optimal lipo-lymphedema  self-management over time.   ASSESSMENT:  CLINICAL IMPRESSION: Completed initial comparative limb volumetrics on RLE only today due to time constraints. Please review hart above in OBJECTIVE section for initial data for leg, thigh, and full RLE limb volumes. Applied multilayer, knee length, RLE compression wraps to RLE using gradient techniques. After re-lacing tennis shoe Pt is able to wear the shoe she wore to clinic in an effort to limit falls risk. Pt verbalize understanding of instructions to trey to leave wraps in place for 24 hours, if possible. Next session we'll complete LLE   volumetrics, complete FOTO for intake and continue wrapping with edu. Cont as petr POC.  OBJECTIVE IMPAIRMENTS: low vision, Abnormal gait, decreased activity tolerance, decreased balance, decreased endurance, decreased knowledge of condition, decreased knowledge of use of DME, decreased mobility, decreased ROM, increased edema, impaired flexibility, postural dysfunction, obesity, and pain.   ACTIVITY LIMITATIONS: functional mobility and ambulation (carrying, lifting, bending, sitting, standing, squatting, sleeping, stairs, transfers, bed mobility, continence, Basic and instrumental ADLs (bathing, dressing, hygiene/grooming, Productive activities ( caring for others) and leisure pursuits, body image, and social participation  PERSONAL FACTORS:  multiple co morbidities, length of time with progressive condition, are also affecting patient's functional outcome.   REHAB POTENTIAL: Fair    EVALUATION COMPLEXITY: Moderate   GOALS: Goals reviewed with patient? Yes  SHORT TERM GOALS: Target date: 4th OT Rx visit   Pt will demonstrate understanding of lymphedema precautions and prevention strategies with modified independence using a printed reference to identify at least 5 precautions and discussing how s/he may implement them into daily life to reduce risk of progression with extra time (Modified  Independence). Baseline:Max A Goal status: INITIAL  2.  Pt will be able to apply multilayer, thigh length, gradient, compression wraps to one leg at a time with modified assistance (extra time) to decrease limb volume, to limit infection risk, and to limit lymphedema progression.  Baseline: Dependent Goal status: INITIAL  LONG TERM GOALS: Target date: 02/01/23  Given this patient's Intake score of TBA/100% on the functional outcomes FOTO tool, patient will experience an increase in function of 3 points to improve basic and instrumental ADLs performance, including lymphedema self-care.  Baseline: TBA   Goal status: INITIAL  2.  Given  this patient's Intake score of 50.0 % on the Lymphedema Life Impact Scale (LLIS), patient will experience a reduction of at least 5 points in her perceived level of functional impairment resulting from lymphedema to improve functional performance and quality of life (QOL). Baseline: 50% Goal status: INITIAL  3.  During Intensive phase CDT Pt will achieve at least 85% compliance with all lymphedema self-care home program components, including daily skin care, compression wraps and /or garments, simple self MLD and lymphatic pumping therex to habituate LE self care protocol  into ADLs for optimal LE self-management over time. Baseline: Dependent Goal status: INITIAL  4.  Pt will achieve at least a 10% volume reduction below the knees to return limb to typical size and shape, to limit infection risk and LE progression, to decrease pain, to improve function. Baseline: dependent Goal status: INITIAL  5.  Pt will obtain proper compression garments/compression braces and be modified independent with donning/doffing to optimize/stabilize with fluid reduction. Baseline:  Goal status: INITIAL  PLAN:  PT FREQUENCY: 2x/week  PT DURATION: 12 weeks  PLANNED INTERVENTIONS: Therapeutic exercises, Therapeutic activity, Patient/Family education, Self Care, Manual lymph  drainage, Compression bandaging, scar mobilization, Manual therapy, and skin care throughout MLD  PLAN FOR NEXT SESSION:  LLE comparative limb volumetrics FOTO assessment Knee length compression wraps to LLE below the knee Pt edu for LE self care   Loel Dubonnet, MS, OTR/L, CLT-LANA 11/09/22 11:57 AM

## 2022-11-11 ENCOUNTER — Ambulatory Visit: Payer: Medicare Other | Admitting: Occupational Therapy

## 2022-11-11 DIAGNOSIS — I890 Lymphedema, not elsewhere classified: Secondary | ICD-10-CM | POA: Diagnosis not present

## 2022-11-11 NOTE — Therapy (Signed)
OUTPATIENT OCCUPATIONAL THERAPY TREATMENT NOTE LOWER EXTREMITY LYMPHEDEMA Patient Name: Emily Wood MRN: [id number] DOB:10-19-1950, 72 y.o., female Today's Date: 11/11/2022  END OF SESSION:   OT End of Session - 11/11/22 0800     Visit Number 3    Number of Visits 36    Date for OT Re-Evaluation 02/01/23    OT Start Time 0800    Activity Tolerance Patient tolerated treatment well;No increased pain   Eval limited by Pt arriving late   Behavior During Therapy [facility] for tasks assessed/performed               Past Medical History:  Diagnosis Date   Arthritis    Diabetes mellitus without complication    Family history of adverse reaction to anesthesia    PONV   Graves disease 04/14/2020   Hypertension    Lymphedema    Mixed hyperlipidemia 02/20/2021   Past Surgical History:  Procedure Laterality Date   CATARACT EXTRACTION W/PHACO Right 04/14/2022   Procedure: CATARACT EXTRACTION PHACO AND INTRAOCULAR LENS PLACEMENT (IOC) RIGHT DIABETIC 10.93 00:53.0;  Surgeon: Galen Manila, MD;  Location: [facility];  Service: Ophthalmology;  Laterality: Right;   CATARACT EXTRACTION W/PHACO Left 04/28/2022   Procedure: CATARACT EXTRACTION PHACO AND INTRAOCULAR LENS PLACEMENT (IOC) LEFT DIABETIC 6.05 00:45.7;  Surgeon: Galen Manila, MD;  Location: [facility];  Service: Ophthalmology;  Laterality: Left;  Diabetic   CHOLECYSTECTOMY     HERNIA REPAIR     OTHER SURGICAL HISTORY     scar tissue removal from bowels   PARTIAL HYSTERECTOMY     Still has ovaries   REVERSE SHOULDER ARTHROPLASTY Right 09/11/2022   Procedure: REVERSE SHOULDER ARTHROPLASTY;  Surgeon: Yolonda Kida, MD;  Location: [facility];  Service: Orthopedics;  Laterality: Right;  120   UTERINE FIBROID SURGERY     Patient Active Problem List   Diagnosis Date Noted   Normocytic anemia 05/23/2022   AKI (acute kidney injury) 05/21/2022   Snoring 11/05/2021   Daytime somnolence 11/05/2021    Coronary artery disease involving native coronary artery of native heart without angina pectoris 05/15/2021   Nonrheumatic tricuspid valve regurgitation 05/15/2021   Pulmonary hypertension, unspecified 05/15/2021   Mixed hyperlipidemia 02/20/2021   Obesity (BMI 30-39.9) 02/20/2021   Diabetic retinopathy 08/28/2020   Mixed incontinence 05/31/2020   Graves disease 04/14/2020   Right renal mass 11/22/2019   Arthritis of left knee 09/25/2019   Lymphedema 01/25/2017   Fibroid uterus 01/14/2017   Chronic kidney disease, stage 3 09/21/2016   Meralgia paresthetica of left side 08/27/2015   Diabetes mellitus type 2, controlled 08/02/2015   Chronic venous insufficiency 06/28/2015   Essential hypertension 02/15/2015   Peripheral edema 02/15/2015   Chronic knee pain 02/15/2015    PCP: Pearline Cables, MD  REFERRING PROVIDER: Sheppard Plumber, NP  REFERRING DIAG: I89.0  THERAPY DIAG:  No diagnosis found.  Rationale for Evaluation and Treatment: Rehabilitation  ONSET DATE: insidious onset >30 years  SUBJECTIVE:  SUBJECTIVE STATEMENT: Mrs Pleitez presents to Occupational Therapy for treatment of BLE lymphedema. Pt drove herself to clinic from her home in [place], and she is unaccompanied today. Pt rates BLE lymphedema (LE)-related pain as 5/10. She rates OA-related bilateral knee pain as 5/10 also. Pt states she has no new complaints since her initial evaluation last week. Pt reports she tolerated R knee length compression wraps for 24 hours after last session without any difficulty. She stated her leg looked much smaller than usual when she took the wraps off. She reports the foot has swollen back up somewhat, but the leg still looks smaller.  PERTINENT HISTORY:  Medical Hx contributing to, or affected by  chronic, progressive lymphedema: Norvasc prescription-common side effect is leg swelling; HTN, CVI, Obesity, CAD, OA knees, CKD stage 3, Graves Disease, DM  PAIN:  Are you having pain? Yes: B knees NPRS scale: 5/10; yes , LE-related B legs 5/10 Pain description: throbbing, tight, heavy Aggravating factors: standing , walking,  Relieving factors: "The medication from my cardiologist", elevation, compression stockings ( Pt has good hip AROM bilaterally and is able to don them herself w extra time in clinic.)  PRECAUTIONS: Fall and Other: LYMPHEDEMA PRECAUTIONS: for CKD, CAD and DM skin   WEIGHT BEARING RESTRICTIONS: No  FALLS:  Has patient fallen since last visit? NO    HAND DOMINANCE: right   PRIOR LEVEL OF FUNCTION: Requires assistive device for independence, Needs assistance with homemaking, and Needs assistance with gait  PATIENT GOALS: ".... to get this lymphedema under control. It's embarrassing. That's why I wear long skirts."   OBJECTIVE:   Mild, Stage  II, Bilateral Lower Extremity Lymphedema 2/2 CVI and Obesity  OBSERVATIONS / OTHER ASSESSMENTS:   Lymphedema Life Impact Scape (LLIS): Intake 11/03/22: 50% (The extent to which L:E-related problems affected your life during the past week)  FOTO functional outcome scale: TBA next session 2/2 time constraints  BLE COMPARATIVE LIMB VOLUMETRICS: Intake measurements completed 10/22/22  LANDMARK RIGHT   R LEG (A-D) 5043.4 ml  R THIGH (E-G) 4182.3 ml  R FULL LIMB (A-G) 7643.8 ml  Limb Volume differential (LVD)   Leg LVD =12.14%, L>R Thigh LVD 8.3%, L>R Full limb LVD= 8.3%   Volume change since initial %  Volume change overall V  (Blank rows = not tested)  LANDMARK LEFT    R LEG (A-D) 5656.0 ml  R THIGH (E-G) 4529.5 ml  R FULL LIMB (A-G) 10185.5 ml  Limb Volume differential (LVD)  %  Volume change since initial %  Volume change overall %  (Blank rows = not tested)   TODAY'S TREATMENT:  Completed BLE limb  volumetrics on LLE today. Multilayer, knee length RLE compression wraps                                                                                                                                        PATIENT  EDUCATION:  Pt edu re outcome of initial comparative limb volumetrics. Continued Pt edu for self-compression wrapping.  All questions answered to the Pt's satisfaction. Good return. Person educated: Patient Education method: Explanation, Demonstration, Tactile cues, Verbal cues, and Handouts Education comprehension: verbalized understanding, returned demonstration, and needs further education  HOME EXERCISE PROGRAM: Intensive Phase Complete Decongestive Therapy (CDT) includes Daily Manual lymphatic drainage, "simple self-MLD) Daily Skin care w/ low ph lotion matching skin ph to limit infection risk and improve tissue flexibility and excursion Daily, BLE lymphatic pumping therex- seated or supine Daily (23/7) multilayer compression bandages below the knee to reduce limb volume, one limb at a time (One each 8 cm, 10 cm and 12 cm wide x 5 meters long short stretch compression wrap applied circumferentially using gradient layering techniques over single layer of cotton stockinett an Rosidal foam- toes to popliteal fossa  Self-management Phase CDT includes: All of the above and Appropriate daytime compression garments and HOS devices full time daily. Replace q 3-6 months  Custom-made gradient compression garments and HOS devices are medically necessary in this case because they are uniquely sized and shaped to fit the exact dimensions of the affected extremities with deformities, and to provide accurate and consistent gradient compression and containment, essential to optimally managing this patient's symptoms of chronic, progressive lymphedema. Multiple custom compression garments are needed for optimal hygiene to limit infection risk. Custom compression garments should be replaced q 3-6  months When worn consistently for optimal lipo-lymphedema self-management over time.   ASSESSMENT:  CLINICAL IMPRESSION: Bilateral volumetrics reveal limb volume differential is greater at all segments on the LLE than the despite R dominance. Leg LVD =12.14%,L>R. Thigh LVD = 8.3%, L>R; and L full limb LVD = 8.3%, L>R. Continued teaching simplified version of RLE gradient compression    wrapping for remainder of session. Pt was reluctant to practice in clinic. Issued 2nd set of wraps and taught wash and wear routine. Pt verbalized understanding that next visit will be devoted to assisting her to apply wraps until she is able to apply them  herself with extra time and any other cues she may need to achieve modified independence. Pt states she has no help for wrapping at home. Cont as per POC.  OBJECTIVE IMPAIRMENTS: low vision, Abnormal gait, decreased activity tolerance, decreased balance, decreased endurance, decreased knowledge of condition, decreased knowledge of use of DME, decreased mobility, decreased ROM, increased edema, impaired flexibility, postural dysfunction, obesity, and pain.   ACTIVITY LIMITATIONS: functional mobility and ambulation (carrying, lifting, bending, sitting, standing, squatting, sleeping, stairs, transfers, bed mobility, continence, Basic and instrumental ADLs (bathing, dressing, hygiene/grooming, Productive activities ( caring for others) and leisure pursuits, body image, and social participation  PERSONAL FACTORS:  multiple co morbidities, length of time with progressive condition, are also affecting patient's functional outcome.   REHAB POTENTIAL: Fair    EVALUATION COMPLEXITY: Moderate   GOALS: Goals reviewed with patient? Yes  SHORT TERM GOALS: Target date: 4th OT Rx visit   Pt will demonstrate understanding of lymphedema precautions and prevention strategies with modified independence using a printed reference to identify at least 5 precautions and discussing  how s/he may implement them into daily life to reduce risk of progression with extra time (Modified Independence). Baseline:Max A Goal status: INITIAL  2.  Pt will be able to apply multilayer, thigh length, gradient, compression wraps to one leg at a time with modified assistance (extra time) to decrease limb volume, to limit infection risk, and to limit lymphedema  progression.  Baseline: Dependent Goal status: INITIAL  LONG TERM GOALS: Target date: 02/01/23  Given this patient's Intake score of TBA/100% on the functional outcomes FOTO tool, patient will experience an increase in function of 3 points to improve basic and instrumental ADLs performance, including lymphedema self-care.  Baseline: TBA   Goal status: INITIAL  2.  Given this patient's Intake score of 50.0 % on the Lymphedema Life Impact Scale (LLIS), patient will experience a reduction of at least 5 points in her perceived level of functional impairment resulting from lymphedema to improve functional performance and quality of life (QOL). Baseline: 50% Goal status: INITIAL  3.  During Intensive phase CDT Pt will achieve at least 85% compliance with all lymphedema self-care home program components, including daily skin care, compression wraps and /or garments, simple self MLD and lymphatic pumping therex to habituate LE self care protocol  into ADLs for optimal LE self-management over time. Baseline: Dependent Goal status: INITIAL  4.  Pt will achieve at least a 10% volume reduction below the knees to return limb to typical size and shape, to limit infection risk and LE progression, to decrease pain, to improve function. Baseline: dependent Goal status: INITIAL  5.  Pt will obtain proper compression garments/compression braces and be modified independent with donning/doffing to optimize/stabilize with fluid reduction. Baseline:  Goal status: INITIAL  PLAN:  PT FREQUENCY: 2x/week  PT DURATION: 12 weeks  PLANNED  INTERVENTIONS: Therapeutic exercises, Therapeutic activity, Patient/Family education, Self Care, Manual lymph drainage, Compression bandaging, scar mobilization, Manual therapy, and skin care throughout MLD  PLAN FOR NEXT SESSION:  LLE comparative limb volumetrics FOTO assessment Knee length compression wraps to LLE below the knee Pt edu for LE self care   Loel Dubonnet, MS, OTR/L, CLT-LANA 11/11/22 8:01 AM

## 2022-11-12 ENCOUNTER — Other Ambulatory Visit: Payer: Self-pay | Admitting: Family Medicine

## 2022-11-12 DIAGNOSIS — I10 Essential (primary) hypertension: Secondary | ICD-10-CM

## 2022-11-13 DIAGNOSIS — Z1211 Encounter for screening for malignant neoplasm of colon: Secondary | ICD-10-CM | POA: Diagnosis not present

## 2022-11-16 ENCOUNTER — Encounter: Payer: Self-pay | Admitting: Podiatry

## 2022-11-16 ENCOUNTER — Ambulatory Visit: Payer: Medicare Other | Admitting: Podiatry

## 2022-11-16 ENCOUNTER — Ambulatory Visit: Payer: Medicare Other | Admitting: Occupational Therapy

## 2022-11-16 ENCOUNTER — Encounter: Payer: Self-pay | Admitting: Occupational Therapy

## 2022-11-16 DIAGNOSIS — I890 Lymphedema, not elsewhere classified: Secondary | ICD-10-CM

## 2022-11-16 DIAGNOSIS — B351 Tinea unguium: Secondary | ICD-10-CM | POA: Diagnosis not present

## 2022-11-16 DIAGNOSIS — M79675 Pain in left toe(s): Secondary | ICD-10-CM | POA: Diagnosis not present

## 2022-11-16 DIAGNOSIS — M79674 Pain in right toe(s): Secondary | ICD-10-CM | POA: Diagnosis not present

## 2022-11-16 NOTE — Therapy (Signed)
OUTPATIENT OCCUPATIONAL THERAPY TREATMENT NOTE LOWER EXTREMITY LYMPHEDEMA Patient Name: Emily Wood MRN: [id number] DOB:06-29-1951, 72 y.o., female Today's Date: 11/16/2022  END OF SESSION:   OT End of Session - 11/16/22 1006     Visit Number 4    Number of Visits 36    Date for OT Re-Evaluation 02/01/23    OT Start Time 1000    OT Stop Time 1100    OT Time Calculation (min) 60 min    Activity Tolerance Patient tolerated treatment well;No increased pain   Eval limited by Pt arriving late   Behavior During Therapy [facility] for tasks assessed/performed               Past Medical History:  Diagnosis Date   Arthritis    Diabetes mellitus without complication (HCC)    Family history of adverse reaction to anesthesia    PONV   Graves disease 04/14/2020   Hypertension    Lymphedema    Mixed hyperlipidemia 02/20/2021   Past Surgical History:  Procedure Laterality Date   CATARACT EXTRACTION W/PHACO Right 04/14/2022   Procedure: CATARACT EXTRACTION PHACO AND INTRAOCULAR LENS PLACEMENT (IOC) RIGHT DIABETIC 10.93 00:53.0;  Surgeon: Galen Manila, MD;  Location: [facility];  Service: Ophthalmology;  Laterality: Right;   CATARACT EXTRACTION W/PHACO Left 04/28/2022   Procedure: CATARACT EXTRACTION PHACO AND INTRAOCULAR LENS PLACEMENT (IOC) LEFT DIABETIC 6.05 00:45.7;  Surgeon: Galen Manila, MD;  Location: [facility];  Service: Ophthalmology;  Laterality: Left;  Diabetic   CHOLECYSTECTOMY     HERNIA REPAIR     OTHER SURGICAL HISTORY     scar tissue removal from bowels   PARTIAL HYSTERECTOMY     Still has ovaries   REVERSE SHOULDER ARTHROPLASTY Right 09/11/2022   Procedure: REVERSE SHOULDER ARTHROPLASTY;  Surgeon: Yolonda Kida, MD;  Location: [facility];  Service: Orthopedics;  Laterality: Right;  120   UTERINE FIBROID SURGERY     Patient Active Problem List   Diagnosis Date Noted   Normocytic anemia 05/23/2022   AKI (acute kidney injury) (HCC)  05/21/2022   Snoring 11/05/2021   Daytime somnolence 11/05/2021   Coronary artery disease involving native coronary artery of native heart without angina pectoris 05/15/2021   Nonrheumatic tricuspid valve regurgitation 05/15/2021   Pulmonary hypertension, unspecified (HCC) 05/15/2021   Mixed hyperlipidemia 02/20/2021   Obesity (BMI 30-39.9) 02/20/2021   Diabetic retinopathy (HCC) 08/28/2020   Mixed incontinence 05/31/2020   Graves disease 04/14/2020   Right renal mass 11/22/2019   Arthritis of left knee 09/25/2019   Lymphedema 01/25/2017   Fibroid uterus 01/14/2017   Chronic kidney disease, stage 3 (HCC) 09/21/2016   Meralgia paresthetica of left side 08/27/2015   Diabetes mellitus type 2, controlled (HCC) 08/02/2015   Chronic venous insufficiency 06/28/2015   Essential hypertension 02/15/2015   Peripheral edema 02/15/2015   Chronic knee pain 02/15/2015    PCP: Pearline Cables, MD  REFERRING PROVIDER: Sheppard Plumber, NP  REFERRING DIAG: I89.0  THERAPY DIAG:  Lymphedema, not elsewhere classified  Rationale for Evaluation and Treatment: Rehabilitation  ONSET DATE: insidious onset >30 years  SUBJECTIVE:  SUBJECTIVE STATEMENT: [name] presents to Occupational Therapy for treatment of BLE lymphedema. She arrives with knee length, multilayer compression wraps on R leg.Pt drove herself to clinic from her home in [place], and she is unaccompanied today. OT and Pt discussed concerns re Pt driving herself with low vision. Pt encouraged to have someone drive her to appointments for safety. Pt rates BLE lymphedema (LE)-related pain as 0/10. She rates OA-related bilateral knee pain as 12/10 also. Pt states she walked too much over the weekend and over did it.  PERTINENT HISTORY:  Medical Hx  contributing to, or affected by chronic, progressive lymphedema: Norvasc prescription-common side effect is leg swelling; HTN, CVI, Obesity, CAD, OA knees, CKD stage 3, Graves Disease, DM  PAIN:  Are you having pain? Yes: B knees NPRS scale: 12/10; yes , LE-related B legs 0/10 Pain description: throbbing, tight, heavy Aggravating factors: standing , walking,  Relieving factors: "The medication from my cardiologist", elevation, compression stockings ( Pt has good hip AROM bilaterally and is able to don them herself w extra time in clinic.)  PRECAUTIONS: Fall and Other: LYMPHEDEMA PRECAUTIONS: for CKD, CAD and DM skin   WEIGHT BEARING RESTRICTIONS: No  FALLS:  Has patient fallen since last visit? NO    HAND DOMINANCE: right   PRIOR LEVEL OF FUNCTION: Requires assistive device for independence, Needs assistance with homemaking, and Needs assistance with gait  PATIENT GOALS: ".... to get this lymphedema under control. It's embarrassing. That's why I wear long skirts."   OBJECTIVE:   Mild, Stage  II, Bilateral Lower Extremity Lymphedema 2/2 CVI and Obesity  OBSERVATIONS / OTHER ASSESSMENTS:   Lymphedema Life Impact Scape (LLIS): Intake 11/03/22: 50% (The extent to which L:E-related problems affected your life during the past week)  FOTO functional outcome scale: Intake 11/16/22: 63%  BLE COMPARATIVE LIMB VOLUMETRICS: Intake measurements completed 10/22/22  LANDMARK RIGHT   R LEG (A-D) 5043.4 ml  R THIGH (E-G) 4182.3 ml  R FULL LIMB (A-G) 7643.8 ml  Limb Volume differential (LVD)   Leg LVD =12.14%, L>R Thigh LVD 8.3%, L>R Full limb LVD= 8.3%   Volume change since initial %  Volume change overall V  (Blank rows = not tested)  LANDMARK LEFT    R LEG (A-D) 5656.0 ml  R THIGH (E-G) 4529.5 ml  R FULL LIMB (A-G) 10185.5 ml  Limb Volume differential (LVD)  %  Volume change since initial %  Volume change overall %  (Blank rows = not tested)   TODAY'S TREATMENT:  Completed BLE  limb volumetrics on LLE today. Multilayer, knee length RLE compression wraps                                                                                                                                        PATIENT EDUCATION:  Pt edu for self-compression wrapping.  All questions answered to the Pt's satisfaction. Good return. Person  educated: Patient Education method: Explanation, Demonstration, Tactile cues, Verbal cues, and Handouts Education comprehension: verbalized understanding, returned demonstration, and needs further education  HOME EXERCISE PROGRAM: Intensive Phase Complete Decongestive Therapy (CDT) includes Daily Manual lymphatic drainage, "simple self-MLD) Daily Skin care w/ low ph lotion matching skin ph to limit infection risk and improve tissue flexibility and excursion Daily, BLE lymphatic pumping therex- seated or supine Daily (23/7) multilayer compression bandages below the knee to reduce limb volume, one limb at a time (One each 8 cm, 10 cm and 12 cm wide x 5 meters long short stretch compression wrap applied circumferentially using gradient layering techniques over single layer of cotton stockinett an Rosidal foam- toes to popliteal fossa  Self-management Phase CDT includes: All of the above and Appropriate daytime compression garments and HOS devices full time daily. Replace q 3-6 months  Custom-made gradient compression garments and HOS devices are medically necessary in this case because they are uniquely sized and shaped to fit the exact dimensions of the affected extremities with deformities, and to provide accurate and consistent gradient compression and containment, essential to optimally managing this patient's symptoms of chronic, progressive lymphedema. Multiple custom compression garments are needed for optimal hygiene to limit infection risk. Custom compression garments should be replaced q 3-6 months When worn consistently for optimal lipo-lymphedema  self-management over time.   ASSESSMENT:  CLINICAL IMPRESSION: Pt declined walking from waiting room ~ 300 ' to clinic for there ex this morning as planned. Joint pain and inflammation in B knees limits functional ambulation and contributes to swelling in knees and leg. Despite multilayer compression wraps being applied incorrectly, R leg and foot are obviously decreased in fluid volume since last visit. Today our entire session was devoted to continuing of teach gradient techniques with multiple opportunities to practice. Pt required ongoing verbal and tactile cues along with demonstration . She did improve techniques with practice slightly, but after bandage rolling activity was inserted between practice sessions, Pt demonstrated some short term memory loss for sequencing and recalling specific simple techniques. Pt and therapist also discussed safety issues re Pt driving to appointments with low vision. Pt reports she has applied for rides from a local agency, but doesn't recall the name the agency. Pt removed wraps at end of session in prep for a podiatry appt. Pt agrees to apply wraps at home later. Cont as per POC.  OBJECTIVE IMPAIRMENTS: low vision, Abnormal gait, decreased activity tolerance, decreased balance, decreased endurance, decreased knowledge of condition, decreased knowledge of use of DME, decreased mobility, decreased ROM, increased edema, impaired flexibility, postural dysfunction, obesity, and pain.   ACTIVITY LIMITATIONS: functional mobility and ambulation (carrying, lifting, bending, sitting, standing, squatting, sleeping, stairs, transfers, bed mobility, continence, Basic and instrumental ADLs (bathing, dressing, hygiene/grooming, Productive activities ( caring for others) and leisure pursuits, body image, and social participation  PERSONAL FACTORS:  multiple co morbidities, length of time with progressive condition, are also affecting patient's functional outcome.   REHAB  POTENTIAL: Fair    EVALUATION COMPLEXITY: Moderate   GOALS: Goals reviewed with patient? Yes  SHORT TERM GOALS: Target date: 4th OT Rx visit   Pt will demonstrate understanding of lymphedema precautions and prevention strategies with modified independence using a printed reference to identify at least 5 precautions and discussing how s/he may implement them into daily life to reduce risk of progression with extra time (Modified Independence). Baseline:Max A Goal status: INITIAL  2.  Pt will be able to apply multilayer, thigh length, gradient, compression  wraps to one leg at a time with modified assistance (extra time) to decrease limb volume, to limit infection risk, and to limit lymphedema progression.  Baseline: Dependent Goal status: 11/16/22 PROGRESSING  LONG TERM GOALS: Target date: 02/01/23  Given this patient's Intake score of 63/100% on the functional outcomes FOTO tool, patient will experience an increase in function of 3 points to improve basic and instrumental ADLs performance, including lymphedema self-care.  Baseline: TBA   Goal status: INITIAL  2.  Given this patient's Intake score of 50.0 % on the Lymphedema Life Impact Scale (LLIS), patient will experience a reduction of at least 5 points in her perceived level of functional impairment resulting from lymphedema to improve functional performance and quality of life (QOL). Baseline: 50% Goal status: INITIAL  3.  During Intensive phase CDT Pt will achieve at least 85% compliance with all lymphedema self-care home program components, including daily skin care, compression wraps and /or garments, simple self MLD and lymphatic pumping therex to habituate LE self care protocol  into ADLs for optimal LE self-management over time. Baseline: Dependent Goal status: INITIAL  4.  Pt will achieve at least a 10% volume reduction below the knees to return limb to typical size and shape, to limit infection risk and LE progression, to  decrease pain, to improve function. Baseline: dependent Goal status: INITIAL  5.  Pt will obtain proper compression garments/compression braces and be modified independent with donning/doffing to optimize/stabilize with fluid reduction. Baseline:  Goal status: INITIAL  PLAN:  PT FREQUENCY: 2x/week  PT DURATION: 12 weeks  PLANNED INTERVENTIONS: Therapeutic exercises, Therapeutic activity, Patient/Family education, Self Care, Manual lymph drainage, Compression bandaging, scar mobilization, Manual therapy, and skin care throughout MLD  PLAN FOR NEXT SESSION:  Knee length compression wraps to LLE below the knee Pt edu for multilayer compression wrapping PRN Commence MLD once wrapping is mastered  Loel Dubonnet, MS, OTR/L, CLT-LANA 11/16/22 11:08 AM

## 2022-11-17 NOTE — Progress Notes (Signed)
Subjective:   Patient ID: Emily Wood, female   DOB: 72 y.o.   MRN: [id number]   HPI Patient presents with significant nail disease 1-5 both feet that are thick and she cannot take care of them become painful and also presents with significant lymphedema currently being treated right leg first with improvement that occurred over the last few weeks.  Patient does not smoke likes to be active   Review of Systems  All other systems reviewed and are negative.       Objective:  Physical Exam Vitals and nursing note reviewed.  Constitutional:      Appearance: She is well-developed.  Pulmonary:     Effort: Pulmonary effort is normal.  Musculoskeletal:        General: Normal range of motion.  Skin:    General: Skin is warm.  Neurological:     Mental Status: She is alert.     Neurovascular status found to be moderately diminished because of the swelling so difficult to ascertain vessels but does have good digital perfusion with patient found to have significant thickness of nailbeds 1-5 both feet that are dystrophic and painful when pressed and digging into the corners.  Patient has good digital perfusion well oriented x 3     Assessment:  Chronic mycotic nail infection with pain 1-5 both feet that she cannot take care of with significant lymphedema lower extremities with treatment right showing some improvement     Plan:  H&P all conditions reviewed continue lymphedema treatment and debrided nailbeds 1-5 both feet no angiogenic bleeding reappoint routine care

## 2022-11-18 ENCOUNTER — Ambulatory Visit: Payer: Medicare Other | Attending: Nurse Practitioner | Admitting: Occupational Therapy

## 2022-11-18 DIAGNOSIS — I890 Lymphedema, not elsewhere classified: Secondary | ICD-10-CM | POA: Diagnosis not present

## 2022-11-18 NOTE — Therapy (Signed)
OUTPATIENT OCCUPATIONAL THERAPY TREATMENT NOTE LOWER EXTREMITY LYMPHEDEMA Patient Name: CHAKIA COUNTS MRN: [id number] DOB:[birth year], 72 y.o., female Today's Date: 11/18/2022  END OF SESSION:   OT End of Session - 11/18/22 0857     Visit Number 5    Number of Visits 36    Date for OT Re-Evaluation 02/01/23    OT Start Time 0800    OT Stop Time 0900    OT Time Calculation (min) 60 min    Activity Tolerance Patient tolerated treatment well;No increased pain   Eval limited by Pt arriving late   Behavior During Therapy [facility] for tasks assessed/performed               Past Medical History:  Diagnosis Date   Arthritis    Diabetes mellitus without complication (HCC)    Family history of adverse reaction to anesthesia    PONV   Graves disease 04/14/2020   Hypertension    Lymphedema    Mixed hyperlipidemia 02/20/2021   Past Surgical History:  Procedure Laterality Date   CATARACT EXTRACTION W/PHACO Right 04/14/2022   Procedure: CATARACT EXTRACTION PHACO AND INTRAOCULAR LENS PLACEMENT (IOC) RIGHT DIABETIC 10.93 00:53.0;  Surgeon: Galen Manila, MD;  Location: [facility];  Service: Ophthalmology;  Laterality: Right;   CATARACT EXTRACTION W/PHACO Left 04/28/2022   Procedure: CATARACT EXTRACTION PHACO AND INTRAOCULAR LENS PLACEMENT (IOC) LEFT DIABETIC 6.05 00:45.7;  Surgeon: Galen Manila, MD;  Location: [facility];  Service: Ophthalmology;  Laterality: Left;  Diabetic   CHOLECYSTECTOMY     HERNIA REPAIR     OTHER SURGICAL HISTORY     scar tissue removal from bowels   PARTIAL HYSTERECTOMY     Still has ovaries   REVERSE SHOULDER ARTHROPLASTY Right 09/11/2022   Procedure: REVERSE SHOULDER ARTHROPLASTY;  Surgeon: Yolonda Kida, MD;  Location: [facility];  Service: Orthopedics;  Laterality: Right;  120   UTERINE FIBROID SURGERY     Patient Active Problem List   Diagnosis Date Noted   Normocytic anemia 05/23/2022   AKI (acute kidney injury) (HCC)  05/21/2022   Snoring 11/05/2021   Daytime somnolence 11/05/2021   Coronary artery disease involving native coronary artery of native heart without angina pectoris 05/15/2021   Nonrheumatic tricuspid valve regurgitation 05/15/2021   Pulmonary hypertension, unspecified (HCC) 05/15/2021   Mixed hyperlipidemia 02/20/2021   Obesity (BMI 30-39.9) 02/20/2021   Diabetic retinopathy (HCC) 08/28/2020   Mixed incontinence 05/31/2020   Graves disease 04/14/2020   Right renal mass 11/22/2019   Arthritis of left knee 09/25/2019   Lymphedema 01/25/2017   Fibroid uterus 01/14/2017   Chronic kidney disease, stage 3 (HCC) 09/21/2016   Meralgia paresthetica of left side 08/27/2015   Diabetes mellitus type 2, controlled (HCC) 08/02/2015   Chronic venous insufficiency 06/28/2015   Essential hypertension 02/15/2015   Peripheral edema 02/15/2015   Chronic knee pain 02/15/2015    PCP: Pearline Cables, MD  REFERRING PROVIDER: Sheppard Plumber, NP  REFERRING DIAG: I89.0  THERAPY DIAG:  Lymphedema, not elsewhere classified  Rationale for Evaluation and Treatment: Rehabilitation  ONSET DATE: insidious onset >30 years  SUBJECTIVE:  SUBJECTIVE STATEMENT: Mrs Grobe presents to Occupational Therapy for treatment of BLE lymphedema. She arrives with knee length, multilayer compression wraps on R leg.Pt drove herself to clinic from her home in [place], and she is unaccompanied today.  Pt rates BLE lymphedema (LE)-related pain as 0/10. She rates OA-related bilateral knee pain as 12/10 also. Pt reports she didn't not apply compression wraps after her last visit and just applied them today. She states she has forgotten how to apply them.  PERTINENT HISTORY:  Medical Hx contributing to, or affected by chronic, progressive  lymphedema: Norvasc prescription-common side effect is leg swelling; HTN, CVI, Obesity, CAD, OA knees, CKD stage 3, Graves Disease, DM  PAIN:  Are you having pain? Yes: B knees NPRS scale: 12/10; yes , LE-related B legs 0/10 Pain description: throbbing, tight, heavy Aggravating factors: standing , walking,  Relieving factors: "The medication from my cardiologist", elevation, compression stockings ( Pt has good hip AROM bilaterally and is able to don them herself w extra time in clinic.)  PRECAUTIONS: Fall and Other: LYMPHEDEMA PRECAUTIONS: for CKD, CAD and DM skin   WEIGHT BEARING RESTRICTIONS: No  FALLS:  Has patient fallen since last visit? NO    HAND DOMINANCE: right   PRIOR LEVEL OF FUNCTION: Requires assistive device for independence, Needs assistance with homemaking, and Needs assistance with gait  PATIENT GOALS: ".... to get this lymphedema under control. It's embarrassing. That's why I wear long skirts."   OBJECTIVE:   Mild, Stage  II, Bilateral Lower Extremity Lymphedema 2/2 CVI and Obesity  OBSERVATIONS / OTHER ASSESSMENTS:   Lymphedema Life Impact Scape (LLIS): Intake 11/03/22: 50% (The extent to which L:E-related problems affected your life during the past week)  FOTO functional outcome scale: Intake 11/16/22: 63%  BLE COMPARATIVE LIMB VOLUMETRICS: Intake measurements completed 10/22/22  LANDMARK RIGHT   R LEG (A-D) 5043.4 ml  R THIGH (E-G) 4182.3 ml  R FULL LIMB (A-G) 7643.8 ml  Limb Volume differential (LVD)   Leg LVD =12.14%, L>R Thigh LVD 8.3%, L>R Full limb LVD= 8.3%   Volume change since initial %  Volume change overall V  (Blank rows = not tested)  LANDMARK LEFT    R LEG (A-D) 5656.0 ml  R THIGH (E-G) 4529.5 ml  R FULL LIMB (A-G) 10185.5 ml  Limb Volume differential (LVD)  %  Volume change since initial %  Volume change overall %  (Blank rows = not tested)   TODAY'S TREATMENT:  Completed BLE limb volumetrics on LLE today. Multilayer, knee  length RLE compression wraps                                                                                                                                        PATIENT EDUCATION:  Pt edu for self-compression wrapping.  All questions answered to the Pt's satisfaction. Good return. Person educated: Patient Education method: Explanation, Demonstration, Tactile cues, Verbal  cues, and Handouts Education comprehension: verbalized understanding, returned demonstration, and needs further education  HOME EXERCISE PROGRAM: Intensive Phase Complete Decongestive Therapy (CDT) includes Daily Manual lymphatic drainage, "simple self-MLD) Daily Skin care w/ low ph lotion matching skin ph to limit infection risk and improve tissue flexibility and excursion Daily, BLE lymphatic pumping therex- seated or supine Daily (23/7) multilayer compression bandages below the knee to reduce limb volume, one limb at a time (One each 8 cm, 10 cm and 12 cm wide x 5 meters long short stretch compression wrap applied circumferentially using gradient layering techniques over single layer of cotton stockinett an Rosidal foam- toes to popliteal fossa  Self-management Phase CDT includes: All of the above and Appropriate daytime compression garments and HOS devices full time daily. Replace q 3-6 months  Custom-made gradient compression garments and HOS devices are medically necessary in this case because they are uniquely sized and shaped to fit the exact dimensions of the affected extremities with deformities, and to provide accurate and consistent gradient compression and containment, essential to optimally managing this patient's symptoms of chronic, progressive lymphedema. Multiple custom compression garments are needed for optimal hygiene to limit infection risk. Custom compression garments should be replaced q 3-6 months When worn consistently for optimal lipo-lymphedema self-management over time.   ASSESSMENT:  CLINICAL  IMPRESSION: Pt continues to use transport wc to clinic instead of walking. Initiated MLD to LLE/LLQ today and started teaching J stroke. Remainder of session dedicated to teaching and practicing knee length compression wrapping. Pt required Max A to apply wraps today. Cont as per POC.  OBJECTIVE IMPAIRMENTS: low vision, Abnormal gait, decreased activity tolerance, decreased balance, decreased endurance, decreased knowledge of condition, decreased knowledge of use of DME, decreased mobility, decreased ROM, increased edema, impaired flexibility, postural dysfunction, obesity, and pain.   ACTIVITY LIMITATIONS: functional mobility and ambulation (carrying, lifting, bending, sitting, standing, squatting, sleeping, stairs, transfers, bed mobility, continence, Basic and instrumental ADLs (bathing, dressing, hygiene/grooming, Productive activities ( caring for others) and leisure pursuits, body image, and social participation  PERSONAL FACTORS:  multiple co morbidities, length of time with progressive condition, are also affecting patient's functional outcome.   REHAB POTENTIAL: Fair    EVALUATION COMPLEXITY: Moderate   GOALS: Goals reviewed with patient? Yes  SHORT TERM GOALS: Target date: 4th OT Rx visit   Pt will demonstrate understanding of lymphedema precautions and prevention strategies with modified independence using a printed reference to identify at least 5 precautions and discussing how s/he may implement them into daily life to reduce risk of progression with extra time (Modified Independence). Baseline:Max A Goal status: INITIAL  2.  Pt will be able to apply multilayer, thigh length, gradient, compression wraps to one leg at a time with modified assistance (extra time) to decrease limb volume, to limit infection risk, and to limit lymphedema progression.  Baseline: Dependent Goal status: 11/16/22 PROGRESSING  LONG TERM GOALS: Target date: 02/01/23  Given this patient's Intake score of  63/100% on the functional outcomes FOTO tool, patient will experience an increase in function of 3 points to improve basic and instrumental ADLs performance, including lymphedema self-care.  Baseline: TBA   Goal status: INITIAL  2.  Given this patient's Intake score of 50.0 % on the Lymphedema Life Impact Scale (LLIS), patient will experience a reduction of at least 5 points in her perceived level of functional impairment resulting from lymphedema to improve functional performance and quality of life (QOL). Baseline: 50% Goal status: INITIAL  3.  During  Intensive phase CDT Pt will achieve at least 85% compliance with all lymphedema self-care home program components, including daily skin care, compression wraps and /or garments, simple self MLD and lymphatic pumping therex to habituate LE self care protocol  into ADLs for optimal LE self-management over time. Baseline: Dependent Goal status: INITIAL  4.  Pt will achieve at least a 10% volume reduction below the knees to return limb to typical size and shape, to limit infection risk and LE progression, to decrease pain, to improve function. Baseline: dependent Goal status: INITIAL  5.  Pt will obtain proper compression garments/compression braces and be modified independent with donning/doffing to optimize/stabilize with fluid reduction. Baseline:  Goal status: INITIAL  PLAN:  PT FREQUENCY: 2x/week  PT DURATION: 12 weeks  PLANNED INTERVENTIONS: Therapeutic exercises, Therapeutic activity, Patient/Family education, Self Care, Manual lymph drainage, Compression bandaging, scar mobilization, Manual therapy, and skin care throughout MLD  PLAN FOR NEXT SESSION:  Knee length compression wraps to LLE below the knee Pt edu for multilayer compression wrapping PRN Continue MLD once wrapping is mastered  Loel Dubonnet, MS, OTR/L, CLT-LANA 11/18/22 9:08 AM

## 2022-11-23 ENCOUNTER — Encounter: Payer: Self-pay | Admitting: Family Medicine

## 2022-11-23 LAB — COLOGUARD: COLOGUARD: NEGATIVE

## 2022-11-24 ENCOUNTER — Ambulatory Visit: Payer: Medicare Other | Admitting: Occupational Therapy

## 2022-11-24 DIAGNOSIS — M25511 Pain in right shoulder: Secondary | ICD-10-CM | POA: Diagnosis not present

## 2022-11-26 ENCOUNTER — Encounter: Payer: Self-pay | Admitting: Occupational Therapy

## 2022-11-26 ENCOUNTER — Ambulatory Visit: Payer: Medicare Other | Admitting: Occupational Therapy

## 2022-11-26 DIAGNOSIS — I890 Lymphedema, not elsewhere classified: Secondary | ICD-10-CM

## 2022-11-26 NOTE — Therapy (Signed)
OUTPATIENT OCCUPATIONAL THERAPY TREATMENT NOTE LOWER EXTREMITY LYMPHEDEMA Patient Name: Emily Wood MRN: [id number] DOB:[birth year], 72 y.o., female Today's Date: 11/26/2022  END OF SESSION:   OT End of Session - 11/26/22 0810     Visit Number 6    Number of Visits 36    Date for OT Re-Evaluation 02/01/23    OT Start Time 0804    Activity Tolerance Patient tolerated treatment well;No increased pain   Eval limited by Pt arriving late   Behavior During Therapy [facility] for tasks assessed/performed               Past Medical History:  Diagnosis Date   Arthritis    Diabetes mellitus without complication (HCC)    Family history of adverse reaction to anesthesia    PONV   Graves disease 04/14/2020   Hypertension    Lymphedema    Mixed hyperlipidemia 02/20/2021   Past Surgical History:  Procedure Laterality Date   CATARACT EXTRACTION W/PHACO Right 04/14/2022   Procedure: CATARACT EXTRACTION PHACO AND INTRAOCULAR LENS PLACEMENT (IOC) RIGHT DIABETIC 10.93 00:53.0;  Surgeon: Galen Manila, MD;  Location: [facility];  Service: Ophthalmology;  Laterality: Right;   CATARACT EXTRACTION W/PHACO Left 04/28/2022   Procedure: CATARACT EXTRACTION PHACO AND INTRAOCULAR LENS PLACEMENT (IOC) LEFT DIABETIC 6.05 00:45.7;  Surgeon: Galen Manila, MD;  Location: [facility];  Service: Ophthalmology;  Laterality: Left;  Diabetic   CHOLECYSTECTOMY     HERNIA REPAIR     OTHER SURGICAL HISTORY     scar tissue removal from bowels   PARTIAL HYSTERECTOMY     Still has ovaries   REVERSE SHOULDER ARTHROPLASTY Right 09/11/2022   Procedure: REVERSE SHOULDER ARTHROPLASTY;  Surgeon: Yolonda Kida, MD;  Location: [facility];  Service: Orthopedics;  Laterality: Right;  120   UTERINE FIBROID SURGERY     Patient Active Problem List   Diagnosis Date Noted   Normocytic anemia 05/23/2022   AKI (acute kidney injury) (HCC) 05/21/2022   Snoring 11/05/2021   Daytime somnolence  11/05/2021   Coronary artery disease involving native coronary artery of native heart without angina pectoris 05/15/2021   Nonrheumatic tricuspid valve regurgitation 05/15/2021   Pulmonary hypertension, unspecified (HCC) 05/15/2021   Mixed hyperlipidemia 02/20/2021   Obesity (BMI 30-39.9) 02/20/2021   Diabetic retinopathy (HCC) 08/28/2020   Mixed incontinence 05/31/2020   Graves disease 04/14/2020   Right renal mass 11/22/2019   Arthritis of left knee 09/25/2019   Lymphedema 01/25/2017   Fibroid uterus 01/14/2017   Chronic kidney disease, stage 3 (HCC) 09/21/2016   Meralgia paresthetica of left side 08/27/2015   Diabetes mellitus type 2, controlled (HCC) 08/02/2015   Chronic venous insufficiency 06/28/2015   Essential hypertension 02/15/2015   Peripheral edema 02/15/2015   Chronic knee pain 02/15/2015    PCP: Pearline Cables, MD  REFERRING PROVIDER: Sheppard Plumber, NP  REFERRING DIAG: I89.0  THERAPY DIAG:  Lymphedema, not elsewhere classified  Rationale for Evaluation and Treatment: Rehabilitation  ONSET DATE: insidious onset >30 years  SUBJECTIVE:  SUBJECTIVE STATEMENT: Emily Wood presents to Occupational Therapy for treatment of BLE lymphedema. She arrives with knee length, multilayer compression wraps on She is unaccompanied today.  Pt rates BLE lymphedema (LE)-related pain as 0/10. She rates OA-related bilateral knee pain as 7-8/10 also. Pt reports she feels like she's mastered compression wrapping.  PERTINENT HISTORY:  Medical Hx contributing to, or affected by chronic, progressive lymphedema: Norvasc prescription-common side effect is leg swelling; HTN, CVI, Obesity, CAD, OA knees, CKD stage 3, Graves Disease, DM  PAIN:  Are you having pain? Yes: B knees NPRS scale: 7-8/10; yes ,  LE-related B legs 0/10 Pain description: throbbing, tight, heavy Aggravating factors: standing , walking,  Relieving factors: "The medication from my cardiologist", elevation, compression stockings ( Pt has good hip AROM bilaterally and is able to don them herself w extra time in clinic.)  PRECAUTIONS: Fall and Other: LYMPHEDEMA PRECAUTIONS: for CKD, CAD and DM skin   WEIGHT BEARING RESTRICTIONS: No  FALLS:  Has patient fallen since last visit? NO    HAND DOMINANCE: right   PRIOR LEVEL OF FUNCTION: Requires assistive device for independence, Needs assistance with homemaking, and Needs assistance with gait  PATIENT GOALS: ".... to get this lymphedema under control. It's embarrassing. That's why I wear long skirts."   OBJECTIVE:   Mild, Stage  II, Bilateral Lower Extremity Lymphedema 2/2 CVI and Obesity  OBSERVATIONS / OTHER ASSESSMENTS:   Lymphedema Life Impact Scape (LLIS): Intake 11/03/22: 50% (The extent to which L:E-related problems affected your life during the past week)  FOTO functional outcome scale: Intake 11/16/22: 63%  BLE COMPARATIVE LIMB VOLUMETRICS: Intake measurements completed 10/22/22  LANDMARK RIGHT   R LEG (A-D) 5043.4 ml  R THIGH (E-G) 4182.3 ml  R FULL LIMB (A-G) 7643.8 ml  Limb Volume differential (LVD)   Leg LVD =12.14%, L>R Thigh LVD 8.3%, L>R Full limb LVD= 8.3%   Volume change since initial %  Volume change overall V  (Blank rows = not tested)  LANDMARK LEFT    R LEG (A-D) 5656.0 ml  R THIGH (E-G) 4529.5 ml  R FULL LIMB (A-G) 10185.5 ml  Limb Volume differential (LVD)  %  Volume change since initial %  Volume change overall %  (Blank rows = not tested)   TODAY'S TREATMENT:  MLD to RLE/RLQ Multilayer, knee length RLE compression wraps                                                                                                                                        PATIENT EDUCATION:  Pt edu for simple self- mld and self-compression  wrapping.  All questions answered to the Pt's satisfaction. Good return. Person educated: Patient Education method: Explanation, Demonstration, Tactile cues, Verbal cues, and Handouts Education comprehension: verbalized understanding, returned demonstration, and needs further education  HOME EXERCISE PROGRAM: Intensive Phase Complete Decongestive Therapy (CDT) includes Daily Manual lymphatic drainage: Short  neck sequence bilaterally w diaphragmatic breathing to engage deep abdominal lymphatics. Stationary J strokes to inguinal LN, then proximal to distal dynamic J strokes to thigh, "bottleneck" region, leg and foot. Finally 3 retrograde sweeps back to terminus. Daily Skin care w/ low ph lotion matching skin ph to limit infection risk and improve tissue flexibility and excursion Daily, BLE lymphatic pumping therex- seated or supine Daily (23/7) multilayer compression bandages below the knee to reduce limb volume, one limb at a time (One each 8 cm, 10 cm and 12 cm wide x 5 meters long short stretch compression wrap applied circumferentially using gradient layering techniques over single layer of cotton stockinett an Rosidal foam- toes to popliteal fossa  Self-management Phase CDT includes: All of the above and Appropriate daytime compression garments and HOS devices full time daily. Replace q 3-6 months  Custom-made gradient compression garments and HOS devices are medically necessary in this case because they are uniquely sized and shaped to fit the exact dimensions of the affected extremities with deformities, and to provide accurate and consistent gradient compression and containment, essential to optimally managing this patient's symptoms of chronic, progressive lymphedema. Multiple custom compression garments are needed for optimal hygiene to limit infection risk. Custom compression garments should be replaced q 3-6 months When worn consistently for optimal lipo-lymphedema self-management over  time.   ASSESSMENT:  CLINICAL IMPRESSION: Pt did an excellent job with compression bandage application. She followed sequence correctly and used correct techniques. Goal Met! RLE swelling below the knee is significantly reduced today after improved Pt compliance between visits. Leg is well de congested distally as evidenced bu lose , wrinkled skin at ankles and foot. MLD well tolerated today without pain. Reapplied compression after manual therapy. Pt demonstrates good progress towards goals. Cont as per OC. Cont as per POC.  OBJECTIVE IMPAIRMENTS: low vision, Abnormal gait, decreased activity tolerance, decreased balance, decreased endurance, decreased knowledge of condition, decreased knowledge of use of DME, decreased mobility, decreased ROM, increased edema, impaired flexibility, postural dysfunction, obesity, and pain.   ACTIVITY LIMITATIONS: functional mobility and ambulation (carrying, lifting, bending, sitting, standing, squatting, sleeping, stairs, transfers, bed mobility, continence, Basic and instrumental ADLs (bathing, dressing, hygiene/grooming, Productive activities ( caring for others) and leisure pursuits, body image, and social participation  PERSONAL FACTORS:  multiple co morbidities, length of time with progressive condition, are also affecting patient's functional outcome.   REHAB POTENTIAL: Fair    EVALUATION COMPLEXITY: Moderate   GOALS: Goals reviewed with patient? Yes  SHORT TERM GOALS: Target date: 4th OT Rx visit   Pt will demonstrate understanding of lymphedema precautions and prevention strategies with modified independence using a printed reference to identify at least 5 precautions and discussing how s/he may implement them into daily life to reduce risk of progression with extra time (Modified Independence). Baseline:Max A Goal status: INITIAL  2.  Pt will be able to apply multilayer, thigh length, gradient, compression wraps to one leg at a time with modified  assistance (extra time) to decrease limb volume, to limit infection risk, and to limit lymphedema progression.  Baseline: Dependent Goal status: 11/16/22 PROGRESSING 11/26/22 GOAL MET  LONG TERM GOALS: Target date: 02/01/23  Given this patient's Intake score of 63/100% on the functional outcomes FOTO tool, patient will experience an increase in function of 3 points to improve basic and instrumental ADLs performance, including lymphedema self-care.  Baseline: TBA   Goal status: INITIAL  2.  Given this patient's Intake score of 50.0 % on the Lymphedema  Life Impact Scale (LLIS), patient will experience a reduction of at least 5 points in her perceived level of functional impairment resulting from lymphedema to improve functional performance and quality of life (QOL). Baseline: 50% Goal status: INITIAL  3.  During Intensive phase CDT Pt will achieve at least 85% compliance with all lymphedema self-care home program components, including daily skin care, compression wraps and /or garments, simple self MLD and lymphatic pumping therex to habituate LE self care protocol  into ADLs for optimal LE self-management over time. Baseline: Dependent Goal status: INITIAL  4.  Pt will achieve at least a 10% volume reduction below the knees to return limb to typical size and shape, to limit infection risk and LE progression, to decrease pain, to improve function. Baseline: dependent Goal status: INITIAL  5.  Pt will obtain proper compression garments/compression braces and be modified independent with donning/doffing to optimize/stabilize with fluid reduction. Baseline:  Goal status: INITIAL  PLAN:  PT FREQUENCY: 2x/week  PT DURATION: 12 weeks  PLANNED INTERVENTIONS: Therapeutic exercises, Therapeutic activity, Patient/Family education, Self Care, Manual lymph drainage, Compression bandaging, scar mobilization, Manual therapy, and skin care throughout MLD  PLAN FOR NEXT SESSION:  Knee length compression  wraps to LLE below the knee Pt edu for multilayer compression wrapping PRN Continue MLD once wrapping is mastered  Loel Dubonnet, MS, OTR/L, CLT-LANA 11/26/22 8:55 AM

## 2022-11-30 ENCOUNTER — Encounter: Payer: Self-pay | Admitting: Occupational Therapy

## 2022-11-30 ENCOUNTER — Ambulatory Visit: Payer: Medicare Other | Admitting: Occupational Therapy

## 2022-11-30 DIAGNOSIS — I890 Lymphedema, not elsewhere classified: Secondary | ICD-10-CM | POA: Diagnosis not present

## 2022-11-30 NOTE — Therapy (Signed)
OUTPATIENT OCCUPATIONAL THERAPY TREATMENT NOTE LOWER EXTREMITY LYMPHEDEMA Patient Name: SHONDRA BINION MRN: [id number] DOB:[birth year], 72 y.o., female Today's Date: 11/30/2022  END OF SESSION:   OT End of Session - 11/30/22 0915     Visit Number 7    Number of Visits 36    Date for OT Re-Evaluation 02/01/23    OT Start Time 0904    OT Stop Time 1010    OT Time Calculation (min) 66 min    Activity Tolerance Patient tolerated treatment well;No increased pain   Eval limited by Pt arriving late   Behavior During Therapy [facility] for tasks assessed/performed               Past Medical History:  Diagnosis Date   Arthritis    Diabetes mellitus without complication (HCC)    Family history of adverse reaction to anesthesia    PONV   Graves disease 04/14/2020   Hypertension    Lymphedema    Mixed hyperlipidemia 02/20/2021   Past Surgical History:  Procedure Laterality Date   CATARACT EXTRACTION W/PHACO Right 04/14/2022   Procedure: CATARACT EXTRACTION PHACO AND INTRAOCULAR LENS PLACEMENT (IOC) RIGHT DIABETIC 10.93 00:53.0;  Surgeon: Galen Manila, MD;  Location: [facility];  Service: Ophthalmology;  Laterality: Right;   CATARACT EXTRACTION W/PHACO Left 04/28/2022   Procedure: CATARACT EXTRACTION PHACO AND INTRAOCULAR LENS PLACEMENT (IOC) LEFT DIABETIC 6.05 00:45.7;  Surgeon: Galen Manila, MD;  Location: [facility];  Service: Ophthalmology;  Laterality: Left;  Diabetic   CHOLECYSTECTOMY     HERNIA REPAIR     OTHER SURGICAL HISTORY     scar tissue removal from bowels   PARTIAL HYSTERECTOMY     Still has ovaries   REVERSE SHOULDER ARTHROPLASTY Right 09/11/2022   Procedure: REVERSE SHOULDER ARTHROPLASTY;  Surgeon: Yolonda Kida, MD;  Location: [facility];  Service: Orthopedics;  Laterality: Right;  120   UTERINE FIBROID SURGERY     Patient Active Problem List   Diagnosis Date Noted   Normocytic anemia 05/23/2022   AKI (acute kidney injury) (HCC)  05/21/2022   Snoring 11/05/2021   Daytime somnolence 11/05/2021   Coronary artery disease involving native coronary artery of native heart without angina pectoris 05/15/2021   Nonrheumatic tricuspid valve regurgitation 05/15/2021   Pulmonary hypertension, unspecified (HCC) 05/15/2021   Mixed hyperlipidemia 02/20/2021   Obesity (BMI 30-39.9) 02/20/2021   Diabetic retinopathy (HCC) 08/28/2020   Mixed incontinence 05/31/2020   Graves disease 04/14/2020   Right renal mass 11/22/2019   Arthritis of left knee 09/25/2019   Lymphedema 01/25/2017   Fibroid uterus 01/14/2017   Chronic kidney disease, stage 3 (HCC) 09/21/2016   Meralgia paresthetica of left side 08/27/2015   Diabetes mellitus type 2, controlled (HCC) 08/02/2015   Chronic venous insufficiency 06/28/2015   Essential hypertension 02/15/2015   Peripheral edema 02/15/2015   Chronic knee pain 02/15/2015    PCP: Pearline Cables, MD  REFERRING PROVIDER: Sheppard Plumber, NP  REFERRING DIAG: I89.0  THERAPY DIAG:  Lymphedema, not elsewhere classified  Rationale for Evaluation and Treatment: Rehabilitation  ONSET DATE: insidious onset >30 years  SUBJECTIVE:  SUBJECTIVE STATEMENT: Mrs Engelkes presents to Occupational Therapy for treatment of BLE lymphedema. She arrives with knee length, multilayer compression wraps in place. She is unaccompanied today and walks to clinic. Pt rates BLE lymphedema (LE)-related pain as 0/10. She rates OA-related bilateral knee pain as 7-8/10 also.   PERTINENT HISTORY:  Medical Hx contributing to, or affected by chronic, progressive lymphedema: Norvasc prescription-common side effect is leg swelling; HTN, CVI, Obesity, CAD, OA knees, CKD stage 3, Graves Disease, DM  PAIN:  Are you having pain? Yes: B knees NPRS  scale: 7-8/10; yes , LE-related B legs 0/10 Pain description: throbbing, tight, heavy Aggravating factors: standing , walking,  Relieving factors: "The medication from my cardiologist", elevation, compression stockings ( Pt has good hip AROM bilaterally and is able to don them herself w extra time in clinic.)  PRECAUTIONS: Fall and Other: LYMPHEDEMA PRECAUTIONS: for CKD, CAD and DM skin   WEIGHT BEARING RESTRICTIONS: No  FALLS:  Has patient fallen since last visit? NO    HAND DOMINANCE: right   PRIOR LEVEL OF FUNCTION: Requires assistive device for independence, Needs assistance with homemaking, and Needs assistance with gait  PATIENT GOALS: ".... to get this lymphedema under control. It's embarrassing. That's why I wear long skirts."   OBJECTIVE:   Mild, Stage  II, Bilateral Lower Extremity Lymphedema 2/2 CVI and Obesity  OBSERVATIONS / OTHER ASSESSMENTS:   Lymphedema Life Impact Scape (LLIS): Intake 11/03/22: 50% (The extent to which L:E-related problems affected your life during the past week)  FOTO functional outcome scale: Intake 11/16/22: 63%  BLE COMPARATIVE LIMB VOLUMETRICS: Intake measurements completed 10/22/22  LANDMARK RIGHT   R LEG (A-D) 5043.4 ml  R THIGH (E-G) 4182.3 ml  R FULL LIMB (A-G) 7643.8 ml  Limb Volume differential (LVD)   Leg LVD =12.14%, L>R Thigh LVD 8.3%, L>R Full limb LVD= 8.3%   Volume change since initial %  Volume change overall V  (Blank rows = not tested)  LANDMARK LEFT    R LEG (A-D) 5656.0 ml  R THIGH (E-G) 4529.5 ml  R FULL LIMB (A-G) 10185.5 ml  Limb Volume differential (LVD)  %  Volume change since initial %  Volume change overall %  (Blank rows = not tested)   TODAY'S TREATMENT:  MLD to RLE/RLQ Multilayer, knee length RLE compression wraps                                                                                                                                        PATIENT EDUCATION:  Pt edu for simple self- mld and  self-compression wrapping.  All questions answered to the Pt's satisfaction. Good return. Person educated: Patient Education method: Explanation, Demonstration, Tactile cues, Verbal cues, and Handouts Education comprehension: verbalized understanding, returned demonstration, and needs further education  HOME EXERCISE PROGRAM: Intensive Phase Complete Decongestive Therapy (CDT) includes Daily Manual lymphatic drainage: Short neck sequence bilaterally w  diaphragmatic breathing to engage deep abdominal lymphatics. Stationary J strokes to inguinal LN, then proximal to distal dynamic J strokes to thigh, "bottleneck" region, leg and foot. Finally 3 retrograde sweeps back to terminus. Daily Skin care w/ low ph lotion matching skin ph to limit infection risk and improve tissue flexibility and excursion Daily, BLE lymphatic pumping therex- seated or supine Daily (23/7) multilayer compression bandages below the knee to reduce limb volume, one limb at a time (One each 8 cm, 10 cm and 12 cm wide x 5 meters long short stretch compression wrap applied circumferentially using gradient layering techniques over single layer of cotton stockinett an Rosidal foam- toes to popliteal fossa  Self-management Phase CDT includes: All of the above and Appropriate daytime compression garments and HOS devices full time daily. Replace q 3-6 months  Custom-made gradient compression garments and HOS devices are medically necessary in this case because they are uniquely sized and shaped to fit the exact dimensions of the affected extremities with deformities, and to provide accurate and consistent gradient compression and containment, essential to optimally managing this patient's symptoms of chronic, progressive lymphedema. Multiple custom compression garments are needed for optimal hygiene to limit infection risk. Custom compression garments should be replaced q 3-6 months When worn consistently for optimal lipo-lymphedema  self-management over time.   ASSESSMENT:  CLINICAL IMPRESSION: Pt compliant with compression wraps during visit interval. Increased ankle swelling noted today, perhaps b/c Pt not using enough Rosidal foam on lowest aspect ankle.Reviewed ankle wrapping portion of compression with Pt while reapplying wraps after manual therapy. Fabricating custom [employer] "kidneys and chip bags for medial and lateral malleoli for use with wraps at next visit. RLE appears to be approaching volume reduction plateau. Cont as per POC.  OBJECTIVE IMPAIRMENTS: low vision, Abnormal gait, decreased activity tolerance, decreased balance, decreased endurance, decreased knowledge of condition, decreased knowledge of use of DME, decreased mobility, decreased ROM, increased edema, impaired flexibility, postural dysfunction, obesity, and pain.   ACTIVITY LIMITATIONS: functional mobility and ambulation (carrying, lifting, bending, sitting, standing, squatting, sleeping, stairs, transfers, bed mobility, continence, Basic and instrumental ADLs (bathing, dressing, hygiene/grooming, Productive activities ( caring for others) and leisure pursuits, body image, and social participation  PERSONAL FACTORS:  multiple co morbidities, length of time with progressive condition, are also affecting patient's functional outcome.   REHAB POTENTIAL: Fair    EVALUATION COMPLEXITY: Moderate   GOALS: Goals reviewed with patient? Yes  SHORT TERM GOALS: Target date: 4th OT Rx visit   Pt will demonstrate understanding of lymphedema precautions and prevention strategies with modified independence using a printed reference to identify at least 5 precautions and discussing how s/he may implement them into daily life to reduce risk of progression with extra time (Modified Independence). Baseline:Max A Goal status: INITIAL  2.  Pt will be able to apply multilayer, thigh length, gradient, compression wraps to one leg at a time with modified assistance  (extra time) to decrease limb volume, to limit infection risk, and to limit lymphedema progression.  Baseline: Dependent Goal status: 11/16/22 PROGRESSING 11/26/22 GOAL MET  LONG TERM GOALS: Target date: 02/01/23  Given this patient's Intake score of 63/100% on the functional outcomes FOTO tool, patient will experience an increase in function of 3 points to improve basic and instrumental ADLs performance, including lymphedema self-care.  Baseline: TBA   Goal status: INITIAL  2.  Given this patient's Intake score of 50.0 % on the Lymphedema Life Impact Scale (LLIS), patient will experience a reduction of at  least 5 points in her perceived level of functional impairment resulting from lymphedema to improve functional performance and quality of life (QOL). Baseline: 50% Goal status: INITIAL  3.  During Intensive phase CDT Pt will achieve at least 85% compliance with all lymphedema self-care home program components, including daily skin care, compression wraps and /or garments, simple self MLD and lymphatic pumping therex to habituate LE self care protocol  into ADLs for optimal LE self-management over time. Baseline: Dependent Goal status: INITIAL  4.  Pt will achieve at least a 10% volume reduction below the knees to return limb to typical size and shape, to limit infection risk and LE progression, to decrease pain, to improve function. Baseline: dependent Goal status: INITIAL  5.  Pt will obtain proper compression garments/compression braces and be modified independent with donning/doffing to optimize/stabilize with fluid reduction. Baseline:  Goal status: INITIAL  PLAN:  PT FREQUENCY: 2x/week  PT DURATION: 12 weeks  PLANNED INTERVENTIONS: Therapeutic exercises, Therapeutic activity, Patient/Family education, Self Care, Manual lymph drainage, Compression bandaging, scar mobilization, Manual therapy, and skin care throughout MLD  PLAN FOR NEXT SESSION:  Knee length compression wraps to  LLE below the knee Pt edu for multilayer compression wrapping PRN Continue MLD once wrapping is mastered  Loel Dubonnet, MS, OTR/L, CLT-LANA 11/30/22 10:19 AM

## 2022-12-02 ENCOUNTER — Ambulatory Visit (HOSPITAL_COMMUNITY)
Admission: RE | Admit: 2022-12-02 | Discharge: 2022-12-02 | Disposition: A | Discharge status: Home / Self Care | Payer: Medicare Other | Source: Ambulatory Visit | Attending: Family Medicine | Admitting: Family Medicine

## 2022-12-02 ENCOUNTER — Ambulatory Visit: Payer: Medicare Other | Admitting: Occupational Therapy

## 2022-12-02 DIAGNOSIS — N281 Cyst of kidney, acquired: Secondary | ICD-10-CM | POA: Diagnosis not present

## 2022-12-02 DIAGNOSIS — I890 Lymphedema, not elsewhere classified: Secondary | ICD-10-CM | POA: Diagnosis not present

## 2022-12-02 DIAGNOSIS — D7389 Other diseases of spleen: Secondary | ICD-10-CM | POA: Diagnosis not present

## 2022-12-02 MED ORDER — GADOBUTROL 1 MMOL/ML IV SOLN
9.0000 mL | Freq: Once | INTRAVENOUS | Status: AC | PRN
  Administered 2022-12-02: 9 mL via INTRAVENOUS

## 2022-12-03 ENCOUNTER — Encounter: Payer: Medicare Other | Admitting: Occupational Therapy

## 2022-12-03 ENCOUNTER — Encounter: Payer: Self-pay | Admitting: Occupational Therapy

## 2022-12-03 DIAGNOSIS — E113311 Type 2 diabetes mellitus with moderate nonproliferative diabetic retinopathy with macular edema, right eye: Secondary | ICD-10-CM | POA: Diagnosis not present

## 2022-12-03 DIAGNOSIS — H2000 Unspecified acute and subacute iridocyclitis: Secondary | ICD-10-CM | POA: Diagnosis not present

## 2022-12-03 NOTE — Therapy (Signed)
OUTPATIENT OCCUPATIONAL THERAPY TREATMENT NOTE LOWER EXTREMITY LYMPHEDEMA Patient Name: KIMARIA DEFRANCE MRN: [id number] DOB:Nov 02, 1950, 72 y.o., female Today's Date: 12/03/2022  END OF SESSION:   OT End of Session - 12/02/22 0759     Visit Number 8    Number of Visits 36    Date for OT Re-Evaluation 02/01/23    OT Start Time 0210    OT Stop Time 0315    OT Time Calculation (min) 65 min    Activity Tolerance Patient tolerated treatment well;No increased pain   Eval limited by Pt arriving late   Behavior During Therapy [facility] for tasks assessed/performed               Past Medical History:  Diagnosis Date   Arthritis    Diabetes mellitus without complication (HCC)    Family history of adverse reaction to anesthesia    PONV   Graves disease 04/14/2020   Hypertension    Lymphedema    Mixed hyperlipidemia 02/20/2021   Past Surgical History:  Procedure Laterality Date   CATARACT EXTRACTION W/PHACO Right 04/14/2022   Procedure: CATARACT EXTRACTION PHACO AND INTRAOCULAR LENS PLACEMENT (IOC) RIGHT DIABETIC 10.93 00:53.0;  Surgeon: Galen Manila, MD;  Location: [facility];  Service: Ophthalmology;  Laterality: Right;   CATARACT EXTRACTION W/PHACO Left 04/28/2022   Procedure: CATARACT EXTRACTION PHACO AND INTRAOCULAR LENS PLACEMENT (IOC) LEFT DIABETIC 6.05 00:45.7;  Surgeon: Galen Manila, MD;  Location: [facility];  Service: Ophthalmology;  Laterality: Left;  Diabetic   CHOLECYSTECTOMY     HERNIA REPAIR     OTHER SURGICAL HISTORY     scar tissue removal from bowels   PARTIAL HYSTERECTOMY     Still has ovaries   REVERSE SHOULDER ARTHROPLASTY Right 09/11/2022   Procedure: REVERSE SHOULDER ARTHROPLASTY;  Surgeon: Yolonda Kida, MD;  Location: [facility];  Service: Orthopedics;  Laterality: Right;  120   UTERINE FIBROID SURGERY     Patient Active Problem List   Diagnosis Date Noted   Normocytic anemia 05/23/2022   AKI (acute kidney injury) (HCC)  05/21/2022   Snoring 11/05/2021   Daytime somnolence 11/05/2021   Coronary artery disease involving native coronary artery of native heart without angina pectoris 05/15/2021   Nonrheumatic tricuspid valve regurgitation 05/15/2021   Pulmonary hypertension, unspecified (HCC) 05/15/2021   Mixed hyperlipidemia 02/20/2021   Obesity (BMI 30-39.9) 02/20/2021   Diabetic retinopathy (HCC) 08/28/2020   Mixed incontinence 05/31/2020   Graves disease 04/14/2020   Right renal mass 11/22/2019   Arthritis of left knee 09/25/2019   Lymphedema 01/25/2017   Fibroid uterus 01/14/2017   Chronic kidney disease, stage 3 (HCC) 09/21/2016   Meralgia paresthetica of left side 08/27/2015   Diabetes mellitus type 2, controlled (HCC) 08/02/2015   Chronic venous insufficiency 06/28/2015   Essential hypertension 02/15/2015   Peripheral edema 02/15/2015   Chronic knee pain 02/15/2015    PCP: Pearline Cables, MD  REFERRING PROVIDER: Sheppard Plumber, NP  REFERRING DIAG: I89.0  THERAPY DIAG:  Lymphedema, not elsewhere classified  Rationale for Evaluation and Treatment: Rehabilitation  ONSET DATE: insidious onset >30 years  SUBJECTIVE:  SUBJECTIVE STATEMENT: Mrs Chlebowski presents to Occupational Therapy for treatment of BLE lymphedema. She arrives with knee length, multilayer compression wraps in place. She is unaccompanied today and walks to clinic. Pt rates BLE lymphedema (LE)-related pain as 0/10. She rates OA-related bilateral knee pain as 7-8/10.  PERTINENT HISTORY:  Medical Hx contributing to, or affected by chronic, progressive lymphedema: Norvasc prescription-common side effect is leg swelling; HTN, CVI, Obesity, CAD, OA knees, CKD stage 3, Graves Disease, DM  PAIN:  Are you having pain? Yes: B knees NPRS scale:  7-8/10; yes , LE-related B legs 0/10 Pain description: throbbing, tight, heavy Aggravating factors: standing , walking,  Relieving factors: "The medication from my cardiologist", elevation, compression stockings ( Pt has good hip AROM bilaterally and is able to don them herself w extra time in clinic.)  PRECAUTIONS: Fall and Other: LYMPHEDEMA PRECAUTIONS: for CKD, CAD and DM skin   WEIGHT BEARING RESTRICTIONS: No  FALLS:  Has patient fallen since last visit? NO    HAND DOMINANCE: right   PRIOR LEVEL OF FUNCTION: Requires assistive device for independence, Needs assistance with homemaking, and Needs assistance with gait  PATIENT GOALS: ".... to get this lymphedema under control. It's embarrassing. That's why I wear long skirts."   OBJECTIVE:   Mild, Stage  II, Bilateral Lower Extremity Lymphedema 2/2 CVI and Obesity  OBSERVATIONS / OTHER ASSESSMENTS:   Lymphedema Life Impact Scape (LLIS): Intake 11/03/22: 50% (The extent to which L:E-related problems affected your life during the past week)  FOTO functional outcome scale: Intake 11/16/22: 63%  BLE COMPARATIVE LIMB VOLUMETRICS: Intake measurements completed 10/22/22  LANDMARK RIGHT   R LEG (A-D) 5043.4 ml  R THIGH (E-G) 4182.3 ml  R FULL LIMB (A-G) 7643.8 ml  Limb Volume differential (LVD)   Leg LVD =12.14%, L>R Thigh LVD 8.3%, L>R Full limb LVD= 8.3%   Volume change since initial %  Volume change overall V  (Blank rows = not tested)  LANDMARK LEFT    R LEG (A-D) 5656.0 ml  R THIGH (E-G) 4529.5 ml  R FULL LIMB (A-G) 10185.5 ml  Limb Volume differential (LVD)  %  Volume change since initial %  Volume change overall %  (Blank rows = not tested)   TODAY'S TREATMENT:  MLD to RLE/RLQ Multilayer, knee length RLE compression wraps                                                                                                                                        PATIENT EDUCATION:  Pt edu for simple self- mld and  self-compression wrapping.  All questions answered to the Pt's satisfaction. Good return. Person educated: Patient Education method: Explanation, Demonstration, Tactile cues, Verbal cues, and Handouts Education comprehension: verbalized understanding, returned demonstration, and needs further education  HOME EXERCISE PROGRAM: Intensive Phase Complete Decongestive Therapy (CDT) includes Daily Manual lymphatic drainage: Short neck sequence bilaterally w diaphragmatic breathing  to engage deep abdominal lymphatics. Stationary J strokes to inguinal LN, then proximal to distal dynamic J strokes to thigh, "bottleneck" region, leg and foot. Finally 3 retrograde sweeps back to terminus. Daily Skin care w/ low ph lotion matching skin ph to limit infection risk and improve tissue flexibility and excursion Daily, BLE lymphatic pumping therex- seated or supine Daily (23/7) multilayer compression bandages below the knee to reduce limb volume, one limb at a time (One each 8 cm, 10 cm and 12 cm wide x 5 meters long short stretch compression wrap applied circumferentially using gradient layering techniques over single layer of cotton stockinett an Rosidal foam- toes to popliteal fossa  Self-management Phase CDT includes: All of the above and Appropriate daytime compression garments and HOS devices full time daily. Replace q 3-6 months  Custom-made gradient compression garments and HOS devices are medically necessary in this case because they are uniquely sized and shaped to fit the exact dimensions of the affected extremities with deformities, and to provide accurate and consistent gradient compression and containment, essential to optimally managing this patient's symptoms of chronic, progressive lymphedema. Multiple custom compression garments are needed for optimal hygiene to limit infection risk. Custom compression garments should be replaced q 3-6 months When worn consistently for optimal lipo-lymphedema  self-management over time.   ASSESSMENT:  CLINICAL IMPRESSION: Pt compliant with compression wraps during visit interval. Increased ankle swelling noted today, perhaps b/c Pt not using enough Rosidal foam on lowest aspect ankle.Reviewed ankle wrapping portion of compression with Pt while reapplying wraps after manual therapy. Fabricating custom [employer] "kidneys and chip bags for medial and lateral malleoli for use with wraps at next visit. RLE appears to be approaching volume reduction plateau. Pt demonstrates steady progress towards all goals.Cont as per POC.  OBJECTIVE IMPAIRMENTS: low vision, Abnormal gait, decreased activity tolerance, decreased balance, decreased endurance, decreased knowledge of condition, decreased knowledge of use of DME, decreased mobility, decreased ROM, increased edema, impaired flexibility, postural dysfunction, obesity, and pain.   ACTIVITY LIMITATIONS: functional mobility and ambulation (carrying, lifting, bending, sitting, standing, squatting, sleeping, stairs, transfers, bed mobility, continence, Basic and instrumental ADLs (bathing, dressing, hygiene/grooming, Productive activities ( caring for others) and leisure pursuits, body image, and social participation  PERSONAL FACTORS:  multiple co morbidities, length of time with progressive condition, are also affecting patient's functional outcome.   REHAB POTENTIAL: Fair    EVALUATION COMPLEXITY: Moderate   GOALS: Goals reviewed with patient? Yes  SHORT TERM GOALS: Target date: 4th OT Rx visit   Pt will demonstrate understanding of lymphedema precautions and prevention strategies with modified independence using a printed reference to identify at least 5 precautions and discussing how s/he may implement them into daily life to reduce risk of progression with extra time (Modified Independence). Baseline:Max A Goal status: INITIAL  2.  Pt will be able to apply multilayer, thigh length, gradient, compression  wraps to one leg at a time with modified assistance (extra time) to decrease limb volume, to limit infection risk, and to limit lymphedema progression.  Baseline: Dependent Goal status: 11/16/22 PROGRESSING 11/26/22 GOAL MET  LONG TERM GOALS: Target date: 02/01/23  Given this patient's Intake score of 63/100% on the functional outcomes FOTO tool, patient will experience an increase in function of 3 points to improve basic and instrumental ADLs performance, including lymphedema self-care.  Baseline: TBA   Goal status: INITIAL  2.  Given this patient's Intake score of 50.0 % on the Lymphedema Life Impact Scale (LLIS), patient will experience  a reduction of at least 5 points in her perceived level of functional impairment resulting from lymphedema to improve functional performance and quality of life (QOL). Baseline: 50% Goal status: INITIAL  3.  During Intensive phase CDT Pt will achieve at least 85% compliance with all lymphedema self-care home program components, including daily skin care, compression wraps and /or garments, simple self MLD and lymphatic pumping therex to habituate LE self care protocol  into ADLs for optimal LE self-management over time. Baseline: Dependent Goal status: INITIAL  4.  Pt will achieve at least a 10% volume reduction below the knees to return limb to typical size and shape, to limit infection risk and LE progression, to decrease pain, to improve function. Baseline: dependent Goal status: INITIAL  5.  Pt will obtain proper compression garments/compression braces and be modified independent with donning/doffing to optimize/stabilize with fluid reduction. Baseline:  Goal status: INITIAL  PLAN:  PT FREQUENCY: 2x/week  PT DURATION: 12 weeks  PLANNED INTERVENTIONS: Therapeutic exercises, Therapeutic activity, Patient/Family education, Self Care, Manual lymph drainage, Compression bandaging, scar mobilization, Manual therapy, and skin care throughout MLD  PLAN  FOR NEXT SESSION:  Knee length compression wraps to LLE below the knee Pt edu for multilayer compression wrapping PRN Continue MLD once wrapping is mastered  Loel Dubonnet, MS, OTR/L, CLT-LANA 12/03/22 8:01 AM

## 2022-12-04 ENCOUNTER — Encounter: Payer: Self-pay | Admitting: Family Medicine

## 2022-12-07 ENCOUNTER — Encounter: Payer: Self-pay | Admitting: Occupational Therapy

## 2022-12-07 ENCOUNTER — Ambulatory Visit: Payer: Medicare Other | Admitting: Occupational Therapy

## 2022-12-07 DIAGNOSIS — I890 Lymphedema, not elsewhere classified: Secondary | ICD-10-CM

## 2022-12-07 NOTE — Therapy (Signed)
OUTPATIENT OCCUPATIONAL THERAPY TREATMENT NOTE  LOWER EXTREMITY LYMPHEDEMA Patient Name: Emily Wood MRN: [id number] DOB:October 30, 1950, 72 y.o., female Today's Date: 12/07/2022  END OF SESSION:   OT End of Session - 12/07/22 0811     Visit Number 9    Number of Visits 36    Date for OT Re-Evaluation 02/01/23    OT Start Time 0804    OT Stop Time 0904    OT Time Calculation (min) 60 min    Activity Tolerance Patient tolerated treatment well;No increased pain   Eval limited by Pt arriving late   Behavior During Therapy [facility] for tasks assessed/performed               Past Medical History:  Diagnosis Date   Arthritis    Diabetes mellitus without complication (HCC)    Family history of adverse reaction to anesthesia    PONV   Graves disease 04/14/2020   Hypertension    Lymphedema    Mixed hyperlipidemia 02/20/2021   Past Surgical History:  Procedure Laterality Date   CATARACT EXTRACTION W/PHACO Right 04/14/2022   Procedure: CATARACT EXTRACTION PHACO AND INTRAOCULAR LENS PLACEMENT (IOC) RIGHT DIABETIC 10.93 00:53.0;  Surgeon: Galen Manila, MD;  Location: [facility];  Service: Ophthalmology;  Laterality: Right;   CATARACT EXTRACTION W/PHACO Left 04/28/2022   Procedure: CATARACT EXTRACTION PHACO AND INTRAOCULAR LENS PLACEMENT (IOC) LEFT DIABETIC 6.05 00:45.7;  Surgeon: Galen Manila, MD;  Location: [facility];  Service: Ophthalmology;  Laterality: Left;  Diabetic   CHOLECYSTECTOMY     HERNIA REPAIR     OTHER SURGICAL HISTORY     scar tissue removal from bowels   PARTIAL HYSTERECTOMY     Still has ovaries   REVERSE SHOULDER ARTHROPLASTY Right 09/11/2022   Procedure: REVERSE SHOULDER ARTHROPLASTY;  Surgeon: Yolonda Kida, MD;  Location: [facility];  Service: Orthopedics;  Laterality: Right;  120   UTERINE FIBROID SURGERY     Patient Active Problem List   Diagnosis Date Noted   Normocytic anemia 05/23/2022   AKI (acute kidney injury)  (HCC) 05/21/2022   Snoring 11/05/2021   Daytime somnolence 11/05/2021   Coronary artery disease involving native coronary artery of native heart without angina pectoris 05/15/2021   Nonrheumatic tricuspid valve regurgitation 05/15/2021   Pulmonary hypertension, unspecified (HCC) 05/15/2021   Mixed hyperlipidemia 02/20/2021   Obesity (BMI 30-39.9) 02/20/2021   Diabetic retinopathy (HCC) 08/28/2020   Mixed incontinence 05/31/2020   Graves disease 04/14/2020   Right renal mass 11/22/2019   Arthritis of left knee 09/25/2019   Lymphedema 01/25/2017   Fibroid uterus 01/14/2017   Chronic kidney disease, stage 3 (HCC) 09/21/2016   Meralgia paresthetica of left side 08/27/2015   Diabetes mellitus type 2, controlled (HCC) 08/02/2015   Chronic venous insufficiency 06/28/2015   Essential hypertension 02/15/2015   Peripheral edema 02/15/2015   Chronic knee pain 02/15/2015    PCP: Pearline Cables, MD  REFERRING PROVIDER: Sheppard Plumber, NP  REFERRING DIAG: I89.0  THERAPY DIAG:  Lymphedema, not elsewhere classified  Rationale for Evaluation and Treatment: Rehabilitation  ONSET DATE: insidious onset >30 years  SUBJECTIVE:  SUBJECTIVE STATEMENT: Emily Wood presents to Occupational Therapy for treatment of BLE lymphedema. She arrives with knee length, multilayer compression wraps in place on RLE. She is unaccompanied and walks to clinic. Pt endorses LE-related leg pain , but does not rate it numerically. Pt has no new complaints.   PERTINENT HISTORY:  Medical Hx contributing to, or affected by chronic, progressive lymphedema: Norvasc prescription-common side effect is leg swelling; HTN, CVI, Obesity, CAD, OA knees, CKD stage 3, Graves Disease, DM  PAIN:  Are you having pain? Yes: B knees ; yes ,  LE-related B legs not rated/10 Pain description: throbbing, tight, heavy Aggravating factors: standing , walking,  Relieving factors: "The medication from my cardiologist", elevation, compression stockings ( Pt has good hip AROM bilaterally and is able to don them herself w extra time in clinic.)  PRECAUTIONS: Fall and Other: LYMPHEDEMA PRECAUTIONS: for CKD, CAD and DM skin   WEIGHT BEARING RESTRICTIONS: No  FALLS:  Has patient fallen since last visit? NO    HAND DOMINANCE: right   PRIOR LEVEL OF FUNCTION: Requires assistive device for independence, Needs assistance with homemaking, and Needs assistance with gait  PATIENT GOALS: ".... to get this lymphedema under control. It's embarrassing. That's why I wear long skirts."   OBJECTIVE:   Mild, Stage  II, Bilateral Lower Extremity Lymphedema 2/2 CVI and Obesity  OBSERVATIONS / OTHER ASSESSMENTS:   Lymphedema Life Impact Scape (LLIS): Intake 11/03/22: 50% (The extent to which L:E-related problems affected your life during the past week)  FOTO functional outcome scale: Intake 11/16/22: 63%  BLE COMPARATIVE LIMB VOLUMETRICS: Intake measurements completed 10/22/22  LANDMARK RIGHT   R LEG (A-D) 5043.4 ml  R THIGH (E-G) 4182.3 ml  R FULL LIMB (A-G) 7643.8 ml  Limb Volume differential (LVD)   Leg LVD =12.14%, L>R Thigh LVD 8.3%, L>R Full limb LVD= 8.3%   Volume change since initial %  Volume change overall V  (Blank rows = not tested)  LANDMARK LEFT    R LEG (A-D) 5656.0 ml  R THIGH (E-G) 4529.5 ml  R FULL LIMB (A-G) 10185.5 ml  Limb Volume differential (LVD)  %  Volume change since initial %  Volume change overall %  (Blank rows = not tested)   9th Visit: 12/07/22  [facility] RIGHT   R LEG (A-D) 5116.3 ml  R THIGH (E-G) 5047.6  ml  R FULL LIMB (A-G) 10163.9  ml  Limb Volume differential (LVD)    Volume change since initial R LEG volume INCREASED by 1.4%;  R THIGH INCREASED by 20.7%, and R FULL LIMB INCREASED by 10.17%  since initial on 10/22/22.  Volume change overall V  (Blank rows = not tested)   TODAY'S TREATMENT:  MLD to RLE/RLQ Multilayer, knee length RLE compression wraps  PATIENT EDUCATION:  Pt edu for LE self care:  All questions answered to the Pt's satisfaction. Good return. Person educated: Patient Education method: Explanation, Demonstration, Tactile cues, Verbal cues, and Handouts Education comprehension: verbalized understanding, returned demonstration, and needs further education  HOME EXERCISE PROGRAM: Intensive Phase Complete Decongestive Therapy (CDT) includes Daily Manual lymphatic drainage: Short neck sequence bilaterally w diaphragmatic breathing to engage deep abdominal lymphatics. Stationary J strokes to inguinal LN, then proximal to distal dynamic J strokes to thigh, "bottleneck" region, leg and foot. Finally 3 retrograde sweeps back to terminus. Daily Skin care w/ low ph lotion matching skin ph to limit infection risk and improve tissue flexibility and excursion Daily, BLE lymphatic pumping therex- seated or supine Daily (23/7) multilayer compression bandages below the knee to reduce limb volume, one limb at a time (One each 8 cm, 10 cm and 12 cm wide x 5 meters long short stretch compression wrap applied circumferentially using gradient layering techniques over single layer of cotton stockinett an Rosidal foam- toes to popliteal fossa  Self-management Phase CDT includes: All of the above and Appropriate daytime compression garments and HOS devices full time daily. Replace q 3-6 months  Custom-made gradient compression garments and HOS devices are medically necessary in this case because they are uniquely sized and shaped to fit the exact dimensions of the affected extremities with deformities, and to provide accurate and consistent gradient  compression and containment, essential to optimally managing this patient's symptoms of chronic, progressive lymphedema. Multiple custom compression garments are needed for optimal hygiene to limit infection risk. Custom compression garments should be replaced q 3-6 months When worn consistently for optimal lipo-lymphedema self-management over time.   ASSESSMENT:  CLINICAL IMPRESSION: Emphasis of visit today on reviewing progress to date and on Pt edu for lymphedema self-care. After skilled teaching Pt able to use the printed reference in her Lymphedema Workbook  to discuss lymphedema precautions and how to apply at least 5 of them to her daily life. Excellent return. RLE comparative limb volumetrics  surprisingly reveal increased limb volumes at all landmarks and full RLE. R LEG volume is INCREASED by 1.4%;  R THIGH volume is  INCREASED by 20.7%, and R FULL LIMB volume is INCREASED by 10.17% since initial on 10/22/22.Pt denies recent weight gain. She reports no other symptoms of systemic fluid retention, and she confirms that she has been wrapping and elevating her legs daily since commencing Rx. Pt continues to demonstrate steady progress towards all goals. Today's surprise increases in RLE volumes must be due to volume overload or weight gain. Unfortunately We did not have time available to complete bilateral volumetrics for comparison. No signs/ symptoms of DVT or infection observed. Cont as per POC.  OBJECTIVE IMPAIRMENTS: low vision, Abnormal gait, decreased activity tolerance, decreased balance, decreased endurance, decreased knowledge of condition, decreased knowledge of use of DME, decreased mobility, decreased ROM, increased edema, impaired flexibility, postural dysfunction, obesity, and pain.   ACTIVITY LIMITATIONS: functional mobility and ambulation (carrying, lifting, bending, sitting, standing, squatting, sleeping, stairs, transfers, bed mobility, continence, Basic and instrumental ADLs (bathing,  dressing, hygiene/grooming, Productive activities ( caring for others) and leisure pursuits, body image, and social participation  PERSONAL FACTORS:  multiple co morbidities, length of time with progressive condition, are also affecting patient's functional outcome.   REHAB POTENTIAL: Fair    EVALUATION COMPLEXITY: Moderate   GOALS: Goals reviewed with patient? Yes  SHORT TERM GOALS: Target date: 4th OT Rx visit   Pt will demonstrate understanding of  lymphedema precautions and prevention strategies with modified independence using a printed reference to identify at least 5 precautions and discussing how s/he may implement them into daily life to reduce risk of progression with extra time (Modified Independence). Baseline:Max A Goal status: 12/07/22 GOAL MET  2.  Pt will be able to apply multilayer, thigh length, gradient, compression wraps to one leg at a time with modified assistance (extra time) to decrease limb volume, to limit infection risk, and to limit lymphedema progression.  Baseline: Dependent Goal status: 11/16/22 PROGRESSING 11/26/22 GOAL MET  LONG TERM GOALS: Target date: 02/01/23  Given this patient's Intake score of 63/100% on the functional outcomes FOTO tool, patient will experience an increase in function of 3 points to improve basic and instrumental ADLs performance, including lymphedema self-care.  Baseline: ONGOING   Goal status:12/07/22 GOAL ONGOING  2.  Given this patient's Intake score of 50.0 % on the Lymphedema Life Impact Scale (LLIS), patient will experience a reduction of at least 5 points in her perceived level of functional impairment resulting from lymphedema to improve functional performance and quality of life (QOL). Baseline: 50% Goal status: 12/07/22 GOAL ONGOING  3.  During Intensive phase CDT Pt will achieve at least 85% compliance with all lymphedema self-care home program components, including daily skin care, compression wraps and /or garments, simple  self MLD and lymphatic pumping therex to habituate LE self care protocol  into ADLs for optimal LE self-management over time. Baseline: Dependent Goal status: 12/07/22 GOAL MET  4.  Pt will achieve at least a 10% volume reduction below the knees to return limb to typical size and shape, to limit infection risk and LE progression, to decrease pain, to improve function. Baseline: dependent Goal status: 12/07/22 GOAL ONGOING  5.  Pt will obtain proper compression garments/compression braces and be modified independent with donning/doffing to optimize/stabilize with fluid reduction. Baseline:  Goal status: 12/07/22 GOAL ONGOING  PLAN:  PT FREQUENCY: 2x/week  PT DURATION: 12 weeks  PLANNED INTERVENTIONS: Therapeutic exercises, Therapeutic activity, Patient/Family education, Self Care, Manual lymph drainage, Compression bandaging, scar mobilization, Manual therapy, and skin care throughout MLD  PLAN FOR NEXT SESSION:  Knee length compression wraps to RLE below the knee Cont Pt edu for LE self care Continue MLD to RLE Finalize Plan for BLE compression garment recommendations and specifications   Loel Dubonnet, MS, OTR/L, CLT-LANA 12/07/22 9:10 AM`

## 2022-12-08 DIAGNOSIS — M25511 Pain in right shoulder: Secondary | ICD-10-CM | POA: Diagnosis not present

## 2022-12-09 ENCOUNTER — Encounter: Payer: Self-pay | Admitting: Occupational Therapy

## 2022-12-09 ENCOUNTER — Ambulatory Visit: Payer: Medicare Other | Admitting: Occupational Therapy

## 2022-12-09 DIAGNOSIS — I890 Lymphedema, not elsewhere classified: Secondary | ICD-10-CM

## 2022-12-09 NOTE — Therapy (Signed)
OUTPATIENT OCCUPATIONAL THERAPY TREATMENT NOTE AND PROGRESS REPORT  LOWER EXTREMITY LYMPHEDEMA Patient Name: Emily Wood MRN: [id number] DOB:[birth year], 72 y.o., female Today's Date: 12/09/2022  REPORTING PERIOD: 11/03/22 - 12/08/22  END OF SESSION:   OT End of Session - 12/09/22 0812     Visit Number 10    Number of Visits 36    Date for OT Re-Evaluation 02/01/23    OT Start Time 0804    Activity Tolerance Patient tolerated treatment well;No increased pain   Eval limited by Pt arriving late   Behavior During Therapy [facility] for tasks assessed/performed               Past Medical History:  Diagnosis Date   Arthritis    Diabetes mellitus without complication (HCC)    Family history of adverse reaction to anesthesia    PONV   Graves disease 04/14/2020   Hypertension    Lymphedema    Mixed hyperlipidemia 02/20/2021   Past Surgical History:  Procedure Laterality Date   CATARACT EXTRACTION W/PHACO Right 04/14/2022   Procedure: CATARACT EXTRACTION PHACO AND INTRAOCULAR LENS PLACEMENT (IOC) RIGHT DIABETIC 10.93 00:53.0;  Surgeon: Emily Manila, MD;  Location: [facility];  Service: Ophthalmology;  Laterality: Right;   CATARACT EXTRACTION W/PHACO Left 04/28/2022   Procedure: CATARACT EXTRACTION PHACO AND INTRAOCULAR LENS PLACEMENT (IOC) LEFT DIABETIC 6.05 00:45.7;  Surgeon: Emily Manila, MD;  Location: [facility];  Service: Ophthalmology;  Laterality: Left;  Diabetic   CHOLECYSTECTOMY     HERNIA REPAIR     OTHER SURGICAL HISTORY     scar tissue removal from bowels   PARTIAL HYSTERECTOMY     Still has ovaries   REVERSE SHOULDER ARTHROPLASTY Right 09/11/2022   Procedure: REVERSE SHOULDER ARTHROPLASTY;  Surgeon: Emily Kida, MD;  Location: [facility];  Service: Orthopedics;  Laterality: Right;  120   UTERINE FIBROID SURGERY     Patient Active Problem List   Diagnosis Date Noted   Normocytic anemia 05/23/2022   AKI (acute kidney injury)  (HCC) 05/21/2022   Snoring 11/05/2021   Daytime somnolence 11/05/2021   Coronary artery disease involving native coronary artery of native heart without angina pectoris 05/15/2021   Nonrheumatic tricuspid valve regurgitation 05/15/2021   Pulmonary hypertension, unspecified (HCC) 05/15/2021   Mixed hyperlipidemia 02/20/2021   Obesity (BMI 30-39.9) 02/20/2021   Diabetic retinopathy (HCC) 08/28/2020   Mixed incontinence 05/31/2020   Graves disease 04/14/2020   Right renal mass 11/22/2019   Arthritis of left knee 09/25/2019   Lymphedema 01/25/2017   Fibroid uterus 01/14/2017   Chronic kidney disease, stage 3 (HCC) 09/21/2016   Meralgia paresthetica of left side 08/27/2015   Diabetes mellitus type 2, controlled (HCC) 08/02/2015   Chronic venous insufficiency 06/28/2015   Essential hypertension 02/15/2015   Peripheral edema 02/15/2015   Chronic knee pain 02/15/2015    PCP: Emily Cables, MD  REFERRING PROVIDER: Sheppard Plumber, NP  REFERRING DIAG: I89.0  THERAPY DIAG:  Lymphedema, not elsewhere classified  Rationale for Evaluation and Treatment: Rehabilitation  ONSET DATE: insidious onset >30 years  SUBJECTIVE:  SUBJECTIVE STATEMENT: Emily Wood presents to Occupational Therapy for treatment of BLE lymphedema. She arrives with knee length, multilayer compression wraps in place on RLE. She is unaccompanied and walks to clinic. Pt endorses LE-related leg pain , but does not rate it numerically. Pt has no new complaints.   PERTINENT HISTORY:  Medical Hx contributing to, or affected by chronic, progressive lymphedema: Norvasc prescription-common side effect is leg swelling; HTN, CVI, Obesity, CAD, OA knees, CKD stage 3, Graves Disease, DM  PAIN:  Are you having pain? Yes: B arthritis knees  pain;  not rated/10 Pain description: throbbing, tight, heavy Aggravating factors: standing , walking,  Relieving factors: "The medication from my cardiologist", elevation, compression stockings ( Pt has good hip AROM bilaterally and is able to don them herself w extra time in clinic.)  PRECAUTIONS: Fall and Other: LYMPHEDEMA PRECAUTIONS: for CKD, CAD and DM skin   WEIGHT BEARING RESTRICTIONS: No  FALLS:  Has patient fallen since last visit? NO    HAND DOMINANCE: right   PRIOR LEVEL OF FUNCTION: Requires assistive device for independence, Needs assistance with homemaking, and Needs assistance with gait  PATIENT GOALS: ".... to get this lymphedema under control. It's embarrassing. That's why I wear long skirts."   OBJECTIVE:   Mild, Stage  II, Bilateral Lower Extremity Lymphedema 2/2 CVI and Obesity  OBSERVATIONS / OTHER ASSESSMENTS:   Lymphedema Life Impact Scape (LLIS): Intake 11/03/22: 50% (The extent to which L:E-related problems affected your life during the past week)  FOTO functional outcome scale: Intake 11/16/22: 63%  BLE COMPARATIVE LIMB VOLUMETRICS:   Intake measurements completed 10/22/22  LANDMARK RIGHT   R LEG (A-D) 5043.4 ml  R THIGH (E-G) 4182.3 ml  R FULL LIMB (A-G) 7643.8 ml  Limb Volume differential (LVD)   Leg LVD =12.14%, L>R Thigh LVD 8.3%, L>R Full limb LVD= 8.3%   Volume change since initial %  Volume change overall V  (Blank rows = not tested)  LANDMARK LEFT    R LEG (A-D) 5656.0 ml  R THIGH (E-G) 4529.5 ml  R FULL LIMB (A-G) 10185.5 ml  Limb Volume differential (LVD)  %  Volume change since initial %  Volume change overall %  (Blank rows = not tested)   9th Visit: 12/07/22  [facility] RIGHT   R LEG (A-D) 5116.3 ml  R THIGH (E-G) 5047.6  ml  R FULL LIMB (A-G) 10163.9  ml  Limb Volume differential (LVD)    Volume change since initial R LEG volume INCREASED by 1.4%;  R THIGH INCREASED by 20.7%, and R FULL LIMB INCREASED by 10.17% since  initial on 10/22/22.  Volume change overall V  (Blank rows = not tested)   TODAY'S TREATMENT:  MLD to RLE/RLQ Multilayer, knee length RLE compression wraps  PATIENT EDUCATION:  Pt edu for LE self care:  MLD, compression bandaging. All questions answered to the Pt's satisfaction. Good return. Person educated: Patient Education method: Explanation, Demonstration, Tactile cues, Verbal cues, and Handouts Education comprehension: verbalized understanding, returned demonstration, and needs further education  HOME EXERCISE PROGRAM: Intensive Phase Complete Decongestive Therapy (CDT) includes Daily Manual lymphatic drainage: Short neck sequence bilaterally w diaphragmatic breathing to engage deep abdominal lymphatics. Stationary J strokes to inguinal LN, then proximal to distal dynamic J strokes to thigh, "bottleneck" region, leg and foot. Finally 3 retrograde sweeps back to terminus. Daily Skin care w/ low ph lotion matching skin ph to limit infection risk and improve tissue flexibility and excursion Daily, BLE lymphatic pumping therex- seated or supine Daily (23/7) multilayer compression bandages below the knee to reduce limb volume, one limb at a time (One each 8 cm, 10 cm and 12 cm wide x 5 meters long short stretch compression wrap applied circumferentially using gradient layering techniques over single layer of cotton stockinett an Rosidal foam- toes to popliteal fossa  Self-management Phase CDT includes: All of the above and Appropriate daytime compression garments and HOS devices full time daily. Replace q 3-6 months  Custom-made gradient compression garments and HOS devices are medically necessary in this case because they are uniquely sized and shaped to fit the exact dimensions of the affected extremities with deformities, and to provide accurate and  consistent gradient compression and containment, essential to optimally managing this patient's symptoms of chronic, progressive lymphedema. Multiple custom compression garments are needed for optimal hygiene to limit infection risk. Custom compression garments should be replaced q 3-6 months When worn consistently for optimal lipo-lymphedema self-management over time.   ASSESSMENT:  CLINICAL IMPRESSION:  LLE comparative limb volumetrics reveal R LEG volume INCREASED by 1.4%;  R THIGH INCREASED by 20.7%, and R FULL LIMB INCREASED by 10.17% since initially measured on 10/22/22. Pt denies recent weight gain. She reports no known symptoms of systemic fluid retention, and she confirms that she has been re wrapping legs every  2-3 days. She confides that she has not been elevating her legs since commencing CDT. There are no observable signs/ symptoms of DVT or infection observed. Pt is also not changing wraps daily as directed, but will work on increasing elevation anytime she is seated for next progress report. Pt will increase frequency of re-wrapping her leg to ensure optimal volume reduction. Please see LONG and SHORT TERM GOALS sections for additional details re progress to date.   Emphasis ef Pt edu on teaching self MLD while providing manual therapy. Pt has mastered short neck sequence . Handout provided. Performed skin care throughout MLD. Re-applied RLE knee length multilayer compression wraps using gradient techniques.Cont as per POC.  OBJECTIVE IMPAIRMENTS: low vision, Abnormal gait, decreased activity tolerance, decreased balance, decreased endurance, decreased knowledge of condition, decreased knowledge of use of DME, decreased mobility, decreased ROM, increased edema, impaired flexibility, postural dysfunction, obesity, and pain.   ACTIVITY LIMITATIONS: functional mobility and ambulation (carrying, lifting, bending, sitting, standing, squatting, sleeping, stairs, transfers, bed mobility, continence,  Basic and instrumental ADLs (bathing, dressing, hygiene/grooming, Productive activities ( caring for others) and leisure pursuits, body image, and social participation  PERSONAL FACTORS:  multiple co morbidities, length of time with progressive condition, are also affecting patient's functional outcome.   REHAB POTENTIAL: Fair    EVALUATION COMPLEXITY: Moderate   GOALS: Goals reviewed with patient? Yes  SHORT TERM GOALS: Target date: 4th OT Rx visit   Pt  will demonstrate understanding of lymphedema precautions and prevention strategies with modified independence using a printed reference to identify at least 5 precautions and discussing how s/he may implement them into daily life to reduce risk of progression with extra time (Modified Independence). Baseline:Max A Goal status: 12/07/22 GOAL MET  2.  Pt will be able to apply multilayer, thigh length, gradient, compression wraps to one leg at a time with modified assistance (extra time) to decrease limb volume, to limit infection risk, and to limit lymphedema progression.  Baseline: Dependent Goal status: 11/16/22 PROGRESSING 11/26/22 GOAL MET  LONG TERM GOALS: Target date: 02/01/23  Given this patient's Intake score of 63/100% on the functional outcomes FOTO tool, patient will experience an increase in function of 3 points to improve basic and instrumental ADLs performance, including lymphedema self-care.  Baseline: ONGOING   Goal status:12/07/22 GOAL ONGOING  2.  Given this patient's Intake score of 50.0 % on the Lymphedema Life Impact Scale (LLIS), patient will experience a reduction of at least 5 points in her perceived level of functional impairment resulting from lymphedema to improve functional performance and quality of life (QOL). Baseline: 50% Goal status: 12/07/22 GOAL ONGOING  3.  During Intensive phase CDT Pt will achieve at least 85% compliance with all lymphedema self-care home program components, including daily skin care,  compression wraps and /or garments, simple self MLD and lymphatic pumping therex to habituate LE self care protocol  into ADLs for optimal LE self-management over time. Baseline: Dependent Goal status: 12/08/22 ONGOING. Pt not changing wraps daily as directed, but will work on increasing elevation anytime she is seated for next progress report. Pt will also increase frequency of re-wrapping her leg to ensure optimal volume reduction.   4.  Pt will achieve at least a 10% volume reduction below the knees to return limb to typical size and shape, to limit infection risk and LE progression, to decrease pain, to improve function. Baseline: dependent Goal status: 12/07/22 GOAL ONGOING  5.  Pt will obtain proper compression garments/compression braces and be modified independent with donning/doffing to optimize/stabilize with fluid reduction. Baseline:  Goal status: 12/07/22 GOAL ONGOING  PLAN:  PT FREQUENCY: 2x/week  PT DURATION: 12 weeks  PLANNED INTERVENTIONS: Therapeutic exercises, Therapeutic activity, Patient/Family education, Self Care, Manual lymph drainage, Compression bandaging, scar mobilization, Manual therapy, and skin care throughout MLD  PLAN FOR NEXT SESSION:  Knee length compression wraps to RLE below the knee Cont Pt edu for LE self care Continue MLD to RLE Finalize Plan for BLE compression garment recommendations and specifications   Loel Dubonnet, MS, OTR/L, CLT-LANA 12/09/22 8:13 AM`

## 2022-12-10 DIAGNOSIS — H2000 Unspecified acute and subacute iridocyclitis: Secondary | ICD-10-CM | POA: Diagnosis not present

## 2022-12-10 DIAGNOSIS — E113311 Type 2 diabetes mellitus with moderate nonproliferative diabetic retinopathy with macular edema, right eye: Secondary | ICD-10-CM | POA: Diagnosis not present

## 2022-12-15 ENCOUNTER — Encounter: Payer: Medicare Other | Admitting: Occupational Therapy

## 2022-12-15 DIAGNOSIS — M25511 Pain in right shoulder: Secondary | ICD-10-CM | POA: Diagnosis not present

## 2022-12-16 ENCOUNTER — Ambulatory Visit: Payer: Medicare Other | Admitting: Occupational Therapy

## 2022-12-16 ENCOUNTER — Other Ambulatory Visit: Payer: Self-pay | Admitting: Cardiology

## 2022-12-16 DIAGNOSIS — I890 Lymphedema, not elsewhere classified: Secondary | ICD-10-CM | POA: Diagnosis not present

## 2022-12-16 DIAGNOSIS — Z96611 Presence of right artificial shoulder joint: Secondary | ICD-10-CM | POA: Diagnosis not present

## 2022-12-16 NOTE — Therapy (Signed)
OUTPATIENT OCCUPATIONAL THERAPY TREATMENT NOTE   LOWER EXTREMITY LYMPHEDEMA Patient Name: Emily Wood MRN: [id number] DOB:1951/04/11, 72 y.o., female Today's Date: 12/16/2022  END OF SESSION:   OT End of Session - 12/16/22 1317     Visit Number 11    Number of Visits 36    Date for OT Re-Evaluation 02/01/23    OT Start Time 0105    OT Stop Time 0205    OT Time Calculation (min) 60 min    Activity Tolerance Patient tolerated treatment well;No increased pain   Eval limited by Pt arriving late   Behavior During Therapy [facility] for tasks assessed/performed               Past Medical History:  Diagnosis Date   Arthritis    Diabetes mellitus without complication (HCC)    Family history of adverse reaction to anesthesia    PONV   Graves disease 04/14/2020   Hypertension    Lymphedema    Mixed hyperlipidemia 02/20/2021   Past Surgical History:  Procedure Laterality Date   CATARACT EXTRACTION W/PHACO Right 04/14/2022   Procedure: CATARACT EXTRACTION PHACO AND INTRAOCULAR LENS PLACEMENT (IOC) RIGHT DIABETIC 10.93 00:53.0;  Surgeon: Galen Manila, MD;  Location: [facility];  Service: Ophthalmology;  Laterality: Right;   CATARACT EXTRACTION W/PHACO Left 04/28/2022   Procedure: CATARACT EXTRACTION PHACO AND INTRAOCULAR LENS PLACEMENT (IOC) LEFT DIABETIC 6.05 00:45.7;  Surgeon: Galen Manila, MD;  Location: [facility];  Service: Ophthalmology;  Laterality: Left;  Diabetic   CHOLECYSTECTOMY     HERNIA REPAIR     OTHER SURGICAL HISTORY     scar tissue removal from bowels   PARTIAL HYSTERECTOMY     Still has ovaries   REVERSE SHOULDER ARTHROPLASTY Right 09/11/2022   Procedure: REVERSE SHOULDER ARTHROPLASTY;  Surgeon: Yolonda Kida, MD;  Location: [facility];  Service: Orthopedics;  Laterality: Right;  120   UTERINE FIBROID SURGERY     Patient Active Problem List   Diagnosis Date Noted   Normocytic anemia 05/23/2022   AKI (acute kidney injury)  (HCC) 05/21/2022   Snoring 11/05/2021   Daytime somnolence 11/05/2021   Coronary artery disease involving native coronary artery of native heart without angina pectoris 05/15/2021   Nonrheumatic tricuspid valve regurgitation 05/15/2021   Pulmonary hypertension, unspecified (HCC) 05/15/2021   Mixed hyperlipidemia 02/20/2021   Obesity (BMI 30-39.9) 02/20/2021   Diabetic retinopathy (HCC) 08/28/2020   Mixed incontinence 05/31/2020   Graves disease 04/14/2020   Right renal mass 11/22/2019   Arthritis of left knee 09/25/2019   Lymphedema 01/25/2017   Fibroid uterus 01/14/2017   Chronic kidney disease, stage 3 (HCC) 09/21/2016   Meralgia paresthetica of left side 08/27/2015   Diabetes mellitus type 2, controlled (HCC) 08/02/2015   Chronic venous insufficiency 06/28/2015   Essential hypertension 02/15/2015   Peripheral edema 02/15/2015   Chronic knee pain 02/15/2015    PCP: Pearline Cables, MD  REFERRING PROVIDER: Sheppard Plumber, NP  REFERRING DIAG: I89.0  THERAPY DIAG:  Lymphedema, not elsewhere classified  Rationale for Evaluation and Treatment: Rehabilitation  ONSET DATE: insidious onset >30 years  SUBJECTIVE:  SUBJECTIVE STATEMENT: Emily Wood presents to Occupational Therapy for treatment of BLE lymphedema. She arrives with knee length, multilayer compression wraps in place on RLE. She is unaccompanied and walks to clinic. Pt endorses LE-related leg pain , but does not rate it numerically. Pt has no new complaints.   PERTINENT HISTORY:  Medical Hx contributing to, or affected by chronic, progressive lymphedema: Norvasc prescription-common side effect is leg swelling; HTN, CVI, Obesity, CAD, OA knees, CKD stage 3, Graves Disease, DM  PAIN:  Are you having pain? Yes: B arthritis knees  pain;  not rated/10 Pain description: throbbing, tight, heavy Aggravating factors: standing , walking,  Relieving factors: "The medication from my cardiologist", elevation, compression stockings ( Pt has good hip AROM bilaterally and is able to don them herself w extra time in clinic.)  PRECAUTIONS: Fall and Other: LYMPHEDEMA PRECAUTIONS: for CKD, CAD and DM skin   WEIGHT BEARING RESTRICTIONS: No  FALLS:  Has patient fallen since last visit? NO    HAND DOMINANCE: right   PRIOR LEVEL OF FUNCTION: Requires assistive device for independence, Needs assistance with homemaking, and Needs assistance with gait  PATIENT GOALS: ".... to get this lymphedema under control. It's embarrassing. That's why I wear long skirts."   OBJECTIVE:   Mild, Stage  II, Bilateral Lower Extremity Lymphedema 2/2 CVI and Obesity  OBSERVATIONS / OTHER ASSESSMENTS:   Lymphedema Life Impact Scape (LLIS): Intake 11/03/22: 50% (The extent to which L:E-related problems affected your life during the past week)  FOTO functional outcome scale: Intake 11/16/22: 63%  BLE COMPARATIVE LIMB VOLUMETRICS:   Intake measurements completed 10/22/22  LANDMARK RIGHT   R LEG (A-D) 5043.4 ml  R THIGH (E-G) 4182.3 ml  R FULL LIMB (A-G) 7643.8 ml  Limb Volume differential (LVD)   Leg LVD =12.14%, L>R Thigh LVD 8.3%, L>R Full limb LVD= 8.3%   Volume change since initial %  Volume change overall V  (Blank rows = not tested)  LANDMARK LEFT    R LEG (A-D) 5656.0 ml  R THIGH (E-G) 4529.5 ml  R FULL LIMB (A-G) 10185.5 ml  Limb Volume differential (LVD)  %  Volume change since initial %  Volume change overall %  (Blank rows = not tested)   9th Visit: 12/07/22  [facility] RIGHT   R LEG (A-D) 5116.3 ml  R THIGH (E-G) 5047.6  ml  R FULL LIMB (A-G) 10163.9  ml  Limb Volume differential (LVD)    Volume change since initial R LEG volume INCREASED by 1.4%;  R THIGH INCREASED by 20.7%, and R FULL LIMB INCREASED by 10.17% since  initial on 10/22/22.  Volume change overall V  (Blank rows = not tested)   TODAY'S TREATMENT:  MLD to RLE/RLQ Multilayer, knee length RLE compression wraps  PATIENT EDUCATION:  Pt edu for LE self care:  simple self-MLD; All questions answered to the Pt's satisfaction. Good return. Person educated: Patient Education method: Explanation, Demonstration, Tactile cues, Verbal cues, and Handouts Education comprehension: verbalized understanding, returned demonstration, and needs further education  HOME EXERCISE PROGRAM: Intensive Phase Complete Decongestive Therapy (CDT) includes Daily Manual lymphatic drainage: Short neck sequence bilaterally w diaphragmatic breathing to engage deep abdominal lymphatics. Stationary J strokes to inguinal LN, then proximal to distal dynamic J strokes to thigh, "bottleneck" region, leg and foot. Finally 3 retrograde sweeps back to terminus. Daily Skin care w/ low ph lotion matching skin ph to limit infection risk and improve tissue flexibility and excursion Daily, BLE lymphatic pumping therex- seated or supine Daily (23/7) multilayer compression bandages below the knee to reduce limb volume, one limb at a time (One each 8 cm, 10 cm and 12 cm wide x 5 meters long short stretch compression wrap applied circumferentially using gradient layering techniques over single layer of cotton stockinett an Rosidal foam- toes to popliteal fossa  Self-management Phase CDT includes: All of the above and Appropriate daytime compression garments and HOS devices full time daily. Replace q 3-6 months  Custom-made gradient compression garments and HOS devices are medically necessary in this case because they are uniquely sized and shaped to fit the exact dimensions of the affected extremities with deformities, and to provide accurate and consistent gradient  compression and containment, essential to optimally managing this patient's symptoms of chronic, progressive lymphedema. Multiple custom compression garments are needed for optimal hygiene to limit infection risk. Custom compression garments should be replaced q 3-6 months When worn consistently for optimal lipo-lymphedema self-management over time.   ASSESSMENT:  CLINICAL IMPRESSION: Pt not getting compression wraps up high enough. Added 1/2 roll of Rosidal to wrap configuration to ensure upper part of leg is decongested to level of LN at posterior knee. Pt tolerated MLD without increased pain. Cont as per POC.  OBJECTIVE IMPAIRMENTS: low vision, Abnormal gait, decreased activity tolerance, decreased balance, decreased endurance, decreased knowledge of condition, decreased knowledge of use of DME, decreased mobility, decreased ROM, increased edema, impaired flexibility, postural dysfunction, obesity, and pain.   ACTIVITY LIMITATIONS: functional mobility and ambulation (carrying, lifting, bending, sitting, standing, squatting, sleeping, stairs, transfers, bed mobility, continence, Basic and instrumental ADLs (bathing, dressing, hygiene/grooming, Productive activities ( caring for others) and leisure pursuits, body image, and social participation  PERSONAL FACTORS:  multiple co morbidities, length of time with progressive condition, are also affecting patient's functional outcome.   REHAB POTENTIAL: Fair    EVALUATION COMPLEXITY: Moderate   GOALS: Goals reviewed with patient? Yes  SHORT TERM GOALS: Target date: 4th OT Rx visit   Pt will demonstrate understanding of lymphedema precautions and prevention strategies with modified independence using a printed reference to identify at least 5 precautions and discussing how s/he may implement them into daily life to reduce risk of progression with extra time (Modified Independence). Baseline:Max A Goal status: 12/07/22 GOAL MET  2.  Pt will be able  to apply multilayer, thigh length, gradient, compression wraps to one leg at a time with modified assistance (extra time) to decrease limb volume, to limit infection risk, and to limit lymphedema progression.  Baseline: Dependent Goal status: 11/16/22 PROGRESSING 11/26/22 GOAL MET  LONG TERM GOALS: Target date: 02/01/23  Given this patient's Intake score of 63/100% on the functional outcomes FOTO tool, patient will experience an increase in function of 3 points to improve basic and instrumental  ADLs performance, including lymphedema self-care.  Baseline: ONGOING   Goal status:12/07/22 GOAL ONGOING  2.  Given this patient's Intake score of 50.0 % on the Lymphedema Life Impact Scale (LLIS), patient will experience a reduction of at least 5 points in her perceived level of functional impairment resulting from lymphedema to improve functional performance and quality of life (QOL). Baseline: 50% Goal status: 12/07/22 GOAL ONGOING  3.  During Intensive phase CDT Pt will achieve at least 85% compliance with all lymphedema self-care home program components, including daily skin care, compression wraps and /or garments, simple self MLD and lymphatic pumping therex to habituate LE self care protocol  into ADLs for optimal LE self-management over time. Baseline: Dependent Goal status: 12/08/22 ONGOING. Pt not changing wraps daily as directed, but will work on increasing elevation anytime she is seated for next progress report. Pt will also increase frequency of re-wrapping her leg to ensure optimal volume reduction.   4.  Pt will achieve at least a 10% volume reduction below the knees to return limb to typical size and shape, to limit infection risk and LE progression, to decrease pain, to improve function. Baseline: dependent Goal status: 12/07/22 GOAL ONGOING  5.  Pt will obtain proper compression garments/compression braces and be modified independent with donning/doffing to optimize/stabilize with fluid  reduction. Baseline:  Goal status: 12/07/22 GOAL ONGOING  PLAN:  PT FREQUENCY: 2x/week  PT DURATION: 12 weeks  PLANNED INTERVENTIONS: Therapeutic exercises, Therapeutic activity, Patient/Family education, Self Care, Manual lymph drainage, Compression bandaging, scar mobilization, Manual therapy, and skin care throughout MLD  PLAN FOR NEXT SESSION:  Knee length compression wraps to RLE below the knee Cont Pt edu for LE self care Continue MLD to RLE Finalize Plan for BLE compression garment recommendations and specifications   Loel Dubonnet, MS, OTR/L, CLT-LANA 12/16/22 2:02 PM`

## 2022-12-17 ENCOUNTER — Encounter: Payer: Medicare Other | Admitting: Occupational Therapy

## 2022-12-21 ENCOUNTER — Ambulatory Visit: Payer: Medicare Other | Attending: Nurse Practitioner | Admitting: Occupational Therapy

## 2022-12-21 DIAGNOSIS — H209 Unspecified iridocyclitis: Secondary | ICD-10-CM | POA: Insufficient documentation

## 2022-12-21 DIAGNOSIS — I890 Lymphedema, not elsewhere classified: Secondary | ICD-10-CM | POA: Insufficient documentation

## 2022-12-21 NOTE — Therapy (Signed)
OUTPATIENT OCCUPATIONAL THERAPY TREATMENT NOTE   LOWER EXTREMITY LYMPHEDEMA Patient Name: Emily Wood MRN: [id number] DOB:[birth year], 72 y.o., female Today's Date: 12/21/2022  END OF SESSION:   OT End of Session - 12/21/22 0758     Visit Number 12    Number of Visits 36    Date for OT Re-Evaluation 02/01/23    OT Start Time 0800    Activity Tolerance Patient tolerated treatment well;No increased pain   Eval limited by Pt arriving late   Behavior During Therapy [facility] for tasks assessed/performed               Past Medical History:  Diagnosis Date   Arthritis    Diabetes mellitus without complication (HCC)    Family history of adverse reaction to anesthesia    PONV   Graves disease 04/14/2020   Hypertension    Lymphedema    Mixed hyperlipidemia 02/20/2021   Past Surgical History:  Procedure Laterality Date   CATARACT EXTRACTION W/PHACO Right 04/14/2022   Procedure: CATARACT EXTRACTION PHACO AND INTRAOCULAR LENS PLACEMENT (IOC) RIGHT DIABETIC 10.93 00:53.0;  Surgeon: Galen Manila, MD;  Location: [facility];  Service: Ophthalmology;  Laterality: Right;   CATARACT EXTRACTION W/PHACO Left 04/28/2022   Procedure: CATARACT EXTRACTION PHACO AND INTRAOCULAR LENS PLACEMENT (IOC) LEFT DIABETIC 6.05 00:45.7;  Surgeon: Galen Manila, MD;  Location: [facility];  Service: Ophthalmology;  Laterality: Left;  Diabetic   CHOLECYSTECTOMY     HERNIA REPAIR     OTHER SURGICAL HISTORY     scar tissue removal from bowels   PARTIAL HYSTERECTOMY     Still has ovaries   REVERSE SHOULDER ARTHROPLASTY Right 09/11/2022   Procedure: REVERSE SHOULDER ARTHROPLASTY;  Surgeon: Yolonda Kida, MD;  Location: [facility];  Service: Orthopedics;  Laterality: Right;  120   UTERINE FIBROID SURGERY     Patient Active Problem List   Diagnosis Date Noted   Normocytic anemia 05/23/2022   AKI (acute kidney injury) (HCC) 05/21/2022   Snoring 11/05/2021   Daytime somnolence  11/05/2021   Coronary artery disease involving native coronary artery of native heart without angina pectoris 05/15/2021   Nonrheumatic tricuspid valve regurgitation 05/15/2021   Pulmonary hypertension, unspecified (HCC) 05/15/2021   Mixed hyperlipidemia 02/20/2021   Obesity (BMI 30-39.9) 02/20/2021   Diabetic retinopathy (HCC) 08/28/2020   Mixed incontinence 05/31/2020   Graves disease 04/14/2020   Right renal mass 11/22/2019   Arthritis of left knee 09/25/2019   Lymphedema 01/25/2017   Fibroid uterus 01/14/2017   Chronic kidney disease, stage 3 (HCC) 09/21/2016   Meralgia paresthetica of left side 08/27/2015   Diabetes mellitus type 2, controlled (HCC) 08/02/2015   Chronic venous insufficiency 06/28/2015   Essential hypertension 02/15/2015   Peripheral edema 02/15/2015   Chronic knee pain 02/15/2015    PCP: Pearline Cables, MD  REFERRING PROVIDER: Sheppard Plumber, NP  REFERRING DIAG: I89.0  THERAPY DIAG:  Lymphedema, not elsewhere classified  Rationale for Evaluation and Treatment: Rehabilitation  ONSET DATE: insidious onset >30 years  SUBJECTIVE:  SUBJECTIVE STATEMENT: Emily Wood presents to Occupational Therapy for treatment of BLE lymphedema. She arrives with knee length, multilayer compression wraps in place on RLE. She is unaccompanied and walks to clinic. Pt endorses LE-related leg pain , but does not rate it numerically. Pt has no new complaints.   PERTINENT HISTORY:  Medical Hx contributing to, or affected by chronic, progressive lymphedema: Norvasc prescription-common side effect is leg swelling; HTN, CVI, Obesity, CAD, OA knees, CKD stage 3, Graves Disease, DM  PAIN:  Are you having pain? Yes: B arthritis knees pain;  not rated/10 Pain description: throbbing, tight,  heavy Aggravating factors: standing , walking,  Relieving factors: "The medication from my cardiologist", elevation, compression stockings ( Pt has good hip AROM bilaterally and is able to don them herself w extra time in clinic.)  PRECAUTIONS: Fall and Other: LYMPHEDEMA PRECAUTIONS: for CKD, CAD and DM skin   WEIGHT BEARING RESTRICTIONS: No  FALLS:  Has patient fallen since last visit? NO    HAND DOMINANCE: right   PRIOR LEVEL OF FUNCTION: Requires assistive device for independence, Needs assistance with homemaking, and Needs assistance with gait  PATIENT GOALS: ".... to get this lymphedema under control. It's embarrassing. That's why I wear long skirts."   OBJECTIVE:   Mild, Stage  II, Bilateral Lower Extremity Lymphedema 2/2 CVI and Obesity  OBSERVATIONS / OTHER ASSESSMENTS:   Lymphedema Life Impact Scape (LLIS): Intake 11/03/22: 50% (The extent to which L:E-related problems affected your life during the past week)  FOTO functional outcome scale: Intake 11/16/22: 63%  BLE COMPARATIVE LIMB VOLUMETRICS:   Intake measurements completed 10/22/22  LANDMARK RIGHT   R LEG (A-D) 5043.4 ml  R THIGH (E-G) 4182.3 ml  R FULL LIMB (A-G) 7643.8 ml  Limb Volume differential (LVD)   Leg LVD =12.14%, L>R Thigh LVD 8.3%, L>R Full limb LVD= 8.3%   Volume change since initial %  Volume change overall V  (Blank rows = not tested)  LANDMARK LEFT    R LEG (A-D) 5656.0 ml  R THIGH (E-G) 4529.5 ml  R FULL LIMB (A-G) 10185.5 ml  Limb Volume differential (LVD)  %  Volume change since initial %  Volume change overall %  (Blank rows = not tested)   9th Visit: 12/07/22  [facility] RIGHT   R LEG (A-D) 5116.3 ml  R THIGH (E-G) 5047.6  ml  R FULL LIMB (A-G) 10163.9  ml  Limb Volume differential (LVD)    Volume change since initial R LEG volume INCREASED by 1.4%;  R THIGH INCREASED by 20.7%, and R FULL LIMB INCREASED by 10.17% since initial on 10/22/22.  Volume change overall V  (Blank rows =  not tested)   TODAY'S TREATMENT:  Anatomical measurements for custom knee length R leg compression garment and HOS device Multilayer, knee length RLE compression wraps       Pt edu for LE self-care: focus today on garment options, recommended specifications, including compression class, wear/care and replacement schedule, garment ordering and fitting process and timeline, including DME vendor role and insurance concerns.  PATIENT EDUCATION:  Pt edu for LE self care:  Continued Pt/ CG edu for lymphedema self care home program throughout session. Topics include outcome of comparative limb volumetrics- starting limb volume differentials (LVDs), technology and gradient techniques used for short stretch, multilayer compression wrapping, simple self-MLD, therapeutic lymphatic pumping exercises, skin/nail care, LE precautions,. compression garment recommendations and specifications, wear and care schedule and compression garment donning / doffing w assistive devices. Discussed progress towards all OT goals since commencing CDT. All questions answered to the Pt's satisfaction. Good return. Person educated: Patient Education method: Explanation, Demonstration, Tactile cues, Verbal cues, and Handouts Education comprehension: verbalized understanding, returned demonstration, and needs further education  HOME EXERCISE PROGRAM: Intensive Phase Complete Decongestive Therapy (CDT) includes Daily Manual lymphatic drainage: Short neck sequence bilaterally w diaphragmatic breathing to engage deep abdominal lymphatics. Stationary J strokes to inguinal LN, then proximal to distal dynamic J strokes to thigh, "bottleneck" region, leg and foot. Finally 3 retrograde sweeps back to terminus. Daily Skin care w/ low ph lotion matching skin ph to limit infection risk and improve tissue flexibility  and excursion Daily, BLE lymphatic pumping therex- seated or supine Daily (23/7) multilayer compression bandages below the knee to reduce limb volume, one limb at a time (One each 8 cm, 10 cm and 12 cm wide x 5 meters long short stretch compression wrap applied circumferentially using gradient layering techniques over single layer of cotton stockinett an Rosidal foam- toes to popliteal fossa  Self-management Phase CDT includes: All of the above and Appropriate daytime compression garments and HOS devices full time daily. Replace q 3-6 months Elvarex, flat knit, ccl 225-32 mmHg) knee highs w open toe, t heel, oblique top edge, 2.5 cm wide SB on top, tricot patch at anterior ankle sewn on all 4 sides. To be work full time waking hours. [employer]st RELAX knee high HOS device  Custom-made gradient compression garments and HOS devices are medically necessary in this case because they are uniquely sized and shaped to fit the exact dimensions of the affected extremities with deformities, and to provide accurate and consistent gradient compression and containment, essential to optimally managing this patient's symptoms of chronic, progressive lymphedema. Multiple custom compression garments are needed for optimal hygiene to limit infection risk. Custom compression garments should be replaced q 3-6 months When worn consistently for optimal lipo-lymphedema self-management over time.   ASSESSMENT:  CLINICAL IMPRESSION: Completed anatomical measurements for RLE custom knee high daytime compression stocking, and HOS device. Selected ccl 2 ( 23-51mmHg) flat knit instead of ccl 3 in effort to make donning and doffing easier for patient. If this lower class fabric does not contain swelling adequately we'll consider increased garment to ccl 3 ( 34-46 mmHg). Pt educated throughout session about options, recommendations, measurement and fitting process. Cont as per POC. Commence LLE CDT one RLE garments are  fitted.  OBJECTIVE IMPAIRMENTS: low vision, Abnormal gait, decreased activity tolerance, decreased balance, decreased endurance, decreased knowledge of condition, decreased knowledge of use of DME, decreased mobility, decreased ROM, increased edema, impaired flexibility, postural dysfunction, obesity, and pain.   ACTIVITY LIMITATIONS: functional mobility and ambulation (carrying, lifting, bending, sitting, standing, squatting, sleeping, stairs, transfers, bed mobility, continence, Basic and instrumental ADLs (bathing, dressing, hygiene/grooming, Productive activities ( caring for others) and leisure pursuits, body image, and social participation  PERSONAL FACTORS:  multiple co morbidities, length of time with progressive condition, are also affecting patient's functional outcome.   REHAB POTENTIAL: Fair    EVALUATION COMPLEXITY: Moderate   GOALS:  Goals reviewed with patient? Yes  SHORT TERM GOALS: Target date: 4th OT Rx visit   Pt will demonstrate understanding of lymphedema precautions and prevention strategies with modified independence using a printed reference to identify at least 5 precautions and discussing how s/he may implement them into daily life to reduce risk of progression with extra time (Modified Independence). Baseline:Max A Goal status: 12/07/22 GOAL MET  2.  Pt will be able to apply multilayer, thigh length, gradient, compression wraps to one leg at a time with modified assistance (extra time) to decrease limb volume, to limit infection risk, and to limit lymphedema progression.  Baseline: Dependent Goal status: 11/16/22 PROGRESSING 11/26/22 GOAL MET  LONG TERM GOALS: Target date: 02/01/23  Given this patient's Intake score of 63/100% on the functional outcomes FOTO tool, patient will experience an increase in function of 3 points to improve basic and instrumental ADLs performance, including lymphedema self-care.  Baseline: ONGOING   Goal status:12/07/22 GOAL ONGOING  2.   Given this patient's Intake score of 50.0 % on the Lymphedema Life Impact Scale (LLIS), patient will experience a reduction of at least 5 points in her perceived level of functional impairment resulting from lymphedema to improve functional performance and quality of life (QOL). Baseline: 50% Goal status: 12/07/22 GOAL ONGOING  3.  During Intensive phase CDT Pt will achieve at least 85% compliance with all lymphedema self-care home program components, including daily skin care, compression wraps and /or garments, simple self MLD and lymphatic pumping therex to habituate LE self care protocol  into ADLs for optimal LE self-management over time. Baseline: Dependent Goal status: 12/08/22 ONGOING. Pt not changing wraps daily as directed, but will work on increasing elevation anytime she is seated for next progress report. Pt will also increase frequency of re-wrapping her leg to ensure optimal volume reduction.   4.  Pt will achieve at least a 10% volume reduction below the knees to return limb to typical size and shape, to limit infection risk and LE progression, to decrease pain, to improve function. Baseline: dependent Goal status: 12/07/22 GOAL ONGOING  5.  Pt will obtain proper compression garments/compression braces and be modified independent with donning/doffing to optimize/stabilize with fluid reduction. Baseline:  Goal status: 12/07/22 GOAL ONGOING  PLAN:  PT FREQUENCY: 2x/week  PT DURATION: 12 weeks  PLANNED INTERVENTIONS: Therapeutic exercises, Therapeutic activity, Patient/Family education, Self Care, Manual lymph drainage, Compression bandaging, scar mobilization, Manual therapy, and skin care throughout MLD  PLAN FOR NEXT SESSION:  Knee length compression wraps to RLE below the knee Cont Pt edu for LE self care Continue MLD to RLE  Loel Dubonnet, MS, OTR/L, CLT-LANA 12/21/22 7:59 AM`

## 2022-12-22 DIAGNOSIS — M25511 Pain in right shoulder: Secondary | ICD-10-CM | POA: Diagnosis not present

## 2022-12-23 ENCOUNTER — Ambulatory Visit: Payer: Medicare Other | Admitting: Occupational Therapy

## 2022-12-23 ENCOUNTER — Encounter: Payer: Self-pay | Admitting: Occupational Therapy

## 2022-12-23 DIAGNOSIS — I890 Lymphedema, not elsewhere classified: Secondary | ICD-10-CM | POA: Diagnosis not present

## 2022-12-23 DIAGNOSIS — H209 Unspecified iridocyclitis: Secondary | ICD-10-CM | POA: Diagnosis not present

## 2022-12-23 NOTE — Therapy (Signed)
OUTPATIENT OCCUPATIONAL THERAPY TREATMENT NOTE   LOWER EXTREMITY LYMPHEDEMA Patient Name: Emily Wood MRN: [id number] DOB:02-16-1951, 72 y.o., female Today's Date: 12/23/2022  END OF SESSION:   OT End of Session - 12/23/22 0815     Visit Number 13    Number of Visits 36    Date for OT Re-Evaluation 02/01/23    OT Start Time 0800    OT Stop Time 0900    OT Time Calculation (min) 60 min    Activity Tolerance Patient tolerated treatment well;No increased pain   Eval limited by Pt arriving late   Behavior During Therapy [facility] for tasks assessed/performed               Past Medical History:  Diagnosis Date   Arthritis    Diabetes mellitus without complication (HCC)    Family history of adverse reaction to anesthesia    PONV   Graves disease 04/14/2020   Hypertension    Lymphedema    Mixed hyperlipidemia 02/20/2021   Past Surgical History:  Procedure Laterality Date   CATARACT EXTRACTION W/PHACO Right 04/14/2022   Procedure: CATARACT EXTRACTION PHACO AND INTRAOCULAR LENS PLACEMENT (IOC) RIGHT DIABETIC 10.93 00:53.0;  Surgeon: Galen Manila, MD;  Location: [facility];  Service: Ophthalmology;  Laterality: Right;   CATARACT EXTRACTION W/PHACO Left 04/28/2022   Procedure: CATARACT EXTRACTION PHACO AND INTRAOCULAR LENS PLACEMENT (IOC) LEFT DIABETIC 6.05 00:45.7;  Surgeon: Galen Manila, MD;  Location: [facility];  Service: Ophthalmology;  Laterality: Left;  Diabetic   CHOLECYSTECTOMY     HERNIA REPAIR     OTHER SURGICAL HISTORY     scar tissue removal from bowels   PARTIAL HYSTERECTOMY     Still has ovaries   REVERSE SHOULDER ARTHROPLASTY Right 09/11/2022   Procedure: REVERSE SHOULDER ARTHROPLASTY;  Surgeon: Yolonda Kida, MD;  Location: [facility];  Service: Orthopedics;  Laterality: Right;  120   UTERINE FIBROID SURGERY     Patient Active Problem List   Diagnosis Date Noted   Normocytic anemia 05/23/2022   AKI (acute kidney injury)  (HCC) 05/21/2022   Snoring 11/05/2021   Daytime somnolence 11/05/2021   Coronary artery disease involving native coronary artery of native heart without angina pectoris 05/15/2021   Nonrheumatic tricuspid valve regurgitation 05/15/2021   Pulmonary hypertension, unspecified (HCC) 05/15/2021   Mixed hyperlipidemia 02/20/2021   Obesity (BMI 30-39.9) 02/20/2021   Diabetic retinopathy (HCC) 08/28/2020   Mixed incontinence 05/31/2020   Graves disease 04/14/2020   Right renal mass 11/22/2019   Arthritis of left knee 09/25/2019   Lymphedema 01/25/2017   Fibroid uterus 01/14/2017   Chronic kidney disease, stage 3 (HCC) 09/21/2016   Meralgia paresthetica of left side 08/27/2015   Diabetes mellitus type 2, controlled (HCC) 08/02/2015   Chronic venous insufficiency 06/28/2015   Essential hypertension 02/15/2015   Peripheral edema 02/15/2015   Chronic knee pain 02/15/2015    PCP: Pearline Cables, MD  REFERRING PROVIDER: Sheppard Plumber, NP  REFERRING DIAG: I89.0  THERAPY DIAG:  Lymphedema, not elsewhere classified  Rationale for Evaluation and Treatment: Rehabilitation  ONSET DATE: insidious onset >30 years  SUBJECTIVE:  SUBJECTIVE STATEMENT: Mrs Fiest presents to Occupational Therapy for treatment of BLE lymphedema. She arrives with knee length, multilayer compression wraps in place on RLE. She is unaccompanied and walks to clinic. Pt denies LE-related leg pain, but endorses knee joint pain bilaterally at 7/10. Pt reports she has shoulder PT yesterday.  PERTINENT HISTORY:  Medical Hx contributing to, or affected by chronic, progressive lymphedema: Norvasc prescription-common side effect is leg swelling; HTN, CVI, Obesity, CAD, OA knees, CKD stage 3, Graves Disease, DM  PAIN:  Are you having pain?  Yes: B arthritis knees pain;  7/10 Pain description: throbbing, tight, heavy Aggravating factors: standing , walking,  Relieving factors: "The medication from my cardiologist", elevation, compression stockings ( Pt has good hip AROM bilaterally and is able to don them herself w extra time in clinic.)  PRECAUTIONS: Fall and Other: LYMPHEDEMA PRECAUTIONS: (CKD, CAD and DM)   WEIGHT BEARING RESTRICTIONS: No  FALLS:  Has patient fallen since last visit? NO    HAND DOMINANCE: right   PRIOR LEVEL OF FUNCTION: Requires assistive device for independence, Needs assistance with homemaking, and Needs assistance with gait  PATIENT GOALS: ".... to get this lymphedema under control. It's embarrassing. That's why I wear long skirts."   OBJECTIVE:   Mild, Stage  II, Bilateral Lower Extremity Lymphedema 2/2 CVI and Obesity  OBSERVATIONS / OTHER ASSESSMENTS:   Lymphedema Life Impact Scape (LLIS): Intake 11/03/22: 50% (The extent to which L:E-related problems affected your life during the past week)  FOTO functional outcome scale: Intake 11/16/22: 63%  BLE COMPARATIVE LIMB VOLUMETRICS:   Intake measurements completed 10/22/22  LANDMARK RIGHT   R LEG (A-D) 5043.4 ml  R THIGH (E-G) 4182.3 ml  R FULL LIMB (A-G) 7643.8 ml  Limb Volume differential (LVD)   Leg LVD =12.14%, L>R Thigh LVD 8.3%, L>R Full limb LVD= 8.3%   Volume change since initial %  Volume change overall V  (Blank rows = not tested)  LANDMARK LEFT    R LEG (A-D) 5656.0 ml  R THIGH (E-G) 4529.5 ml  R FULL LIMB (A-G) 10185.5 ml  Limb Volume differential (LVD)  %  Volume change since initial %  Volume change overall %  (Blank rows = not tested)   9th Visit: 12/07/22  [facility] RIGHT   R LEG (A-D) 5116.3 ml  R THIGH (E-G) 5047.6  ml  R FULL LIMB (A-G) 10163.9  ml  Limb Volume differential (LVD)    Volume change since initial R LEG volume INCREASED by 1.4%;  R THIGH INCREASED by 20.7%, and R FULL LIMB INCREASED by 10.17%  since initial on 10/22/22.  Volume change overall V  (Blank rows = not tested)   TODAY'S TREATMENT:  MLD to RLE as established with simultaneous skin care RLE Multilayer, knee length, compression wraps                        PATIENT EDUCATION:  Pt edu for LE self care:  Continued Pt/ CG edu for lymphedema self care home program throughout session. Topics include outcome of comparative limb volumetrics- starting limb volume differentials (LVDs), technology and gradient techniques used for short stretch, multilayer compression wrapping, simple self-MLD, therapeutic lymphatic pumping exercises, skin/nail care, LE precautions,. compression garment recommendations and specifications, wear and care schedule and compression garment donning / doffing w assistive devices. Discussed progress towards all OT goals since commencing CDT. All questions answered to the Pt's satisfaction. Good return. Person educated: Patient Education method: Explanation,  Demonstration, Tactile cues, Verbal cues, and Handouts Education comprehension: verbalized understanding, returned demonstration, and needs further education  HOME EXERCISE PROGRAM: Intensive Phase Complete Decongestive Therapy (CDT) includes Daily Manual lymphatic drainage: Short neck sequence bilaterally w diaphragmatic breathing to engage deep abdominal lymphatics. Stationary J strokes to inguinal LN, then proximal to distal dynamic J strokes to thigh, "bottleneck" region, leg and foot. Finally 3 retrograde sweeps back to terminus. Daily Skin care w/ low ph lotion matching skin ph to limit infection risk and improve tissue flexibility and excursion Daily, BLE lymphatic pumping therex- seated or supine Daily (23/7) multilayer compression bandages below the knee to reduce limb volume, one limb at a time (One each 8 cm, 10 cm and 12 cm wide x 5 meters long short stretch compression wrap applied circumferentially using gradient layering techniques over single  layer of cotton stockinett an Rosidal foam- toes to popliteal fossa  Self-management Phase CDT includes: All of the above and Appropriate daytime compression garments and HOS devices full time daily. Replace q 3-6 months Elvarex, flat knit, ccl 225-32 mmHg) knee highs w open toe, t heel, oblique top edge, 2.5 cm wide SB on top, tricot patch at anterior ankle sewn on all 4 sides. To be work full time waking hours. Jobst RELAX knee high HOS device  Custom-made gradient compression garments and HOS devices are medically necessary in this case because they are uniquely sized and shaped to fit the exact dimensions of the affected extremities with deformities, and to provide accurate and consistent gradient compression and containment, essential to optimally managing this patient's symptoms of chronic, progressive lymphedema. Multiple custom compression garments are needed for optimal hygiene to limit infection risk. Custom compression garments should be replaced q 3-6 months When worn consistently for optimal lipo-lymphedema self-management over time.   ASSESSMENT:  CLINICAL IMPRESSION: Pt tolerated RLE/RLQ MLD, also including fibrosis techniques to distal leg, ankle and dorsal foot. Multilayer compression wraps applied below the knee on R. Pt denies increased pain with Rx. Cont as per POC.  OBJECTIVE IMPAIRMENTS: low vision, Abnormal gait, decreased activity tolerance, decreased balance, decreased endurance, decreased knowledge of condition, decreased knowledge of use of DME, decreased mobility, decreased ROM, increased edema, impaired flexibility, postural dysfunction, obesity, and pain.   ACTIVITY LIMITATIONS: functional mobility and ambulation (carrying, lifting, bending, sitting, standing, squatting, sleeping, stairs, transfers, bed mobility, continence, Basic and instrumental ADLs (bathing, dressing, hygiene/grooming, Productive activities ( caring for others) and leisure pursuits, body image, and  social participation  PERSONAL FACTORS:  multiple co morbidities, length of time with progressive condition, are also affecting patient's functional outcome.   REHAB POTENTIAL: Fair    EVALUATION COMPLEXITY: Moderate   GOALS: Goals reviewed with patient? Yes  SHORT TERM GOALS: Target date: 4th OT Rx visit   Pt will demonstrate understanding of lymphedema precautions and prevention strategies with modified independence using a printed reference to identify at least 5 precautions and discussing how s/he may implement them into daily life to reduce risk of progression with extra time (Modified Independence). Baseline:Max A Goal status: 12/07/22 GOAL MET  2.  Pt will be able to apply multilayer, thigh length, gradient, compression wraps to one leg at a time with modified assistance (extra time) to decrease limb volume, to limit infection risk, and to limit lymphedema progression.  Baseline: Dependent Goal status: 11/16/22 PROGRESSING 11/26/22 GOAL MET  LONG TERM GOALS: Target date: 02/01/23  Given this patient's Intake score of 63/100% on the functional outcomes FOTO tool, patient will experience an  increase in function of 3 points to improve basic and instrumental ADLs performance, including lymphedema self-care.  Baseline: ONGOING   Goal status:12/07/22 GOAL ONGOING  2.  Given this patient's Intake score of 50.0 % on the Lymphedema Life Impact Scale (LLIS), patient will experience a reduction of at least 5 points in her perceived level of functional impairment resulting from lymphedema to improve functional performance and quality of life (QOL). Baseline: 50% Goal status: 12/07/22 GOAL ONGOING  3.  During Intensive phase CDT Pt will achieve at least 85% compliance with all lymphedema self-care home program components, including daily skin care, compression wraps and /or garments, simple self MLD and lymphatic pumping therex to habituate LE self care protocol  into ADLs for optimal LE  self-management over time. Baseline: Dependent Goal status: 12/08/22 ONGOING. Pt not changing wraps daily as directed, but will work on increasing elevation anytime she is seated for next progress report. Pt will also increase frequency of re-wrapping her leg to ensure optimal volume reduction.   4.  Pt will achieve at least a 10% volume reduction below the knees to return limb to typical size and shape, to limit infection risk and LE progression, to decrease pain, to improve function. Baseline: dependent Goal status: 12/07/22 GOAL ONGOING  5.  Pt will obtain proper compression garments/compression braces and be modified independent with donning/doffing to optimize/stabilize with fluid reduction. Baseline:  Goal status: 12/07/22 GOAL ONGOING  PLAN:  PT FREQUENCY: 2x/week  PT DURATION: 12 weeks  PLANNED INTERVENTIONS: Therapeutic exercises, Therapeutic activity, Patient/Family education, Self Care, Manual lymph drainage, Compression bandaging, scar mobilization, Manual therapy, and skin care throughout MLD  PLAN FOR NEXT SESSION:  Knee length compression wraps to RLE below the knee Cont Pt edu for LE self care Continue MLD to RLE  Loel Dubonnet, MS, OTR/L, CLT-LANA 12/23/22 9:01 AM`

## 2022-12-28 ENCOUNTER — Encounter: Payer: Self-pay | Admitting: Occupational Therapy

## 2022-12-28 ENCOUNTER — Ambulatory Visit: Payer: Medicare Other | Admitting: Occupational Therapy

## 2022-12-28 DIAGNOSIS — I890 Lymphedema, not elsewhere classified: Secondary | ICD-10-CM

## 2022-12-28 DIAGNOSIS — H209 Unspecified iridocyclitis: Secondary | ICD-10-CM | POA: Diagnosis not present

## 2022-12-28 NOTE — Therapy (Signed)
OUTPATIENT OCCUPATIONAL THERAPY TREATMENT NOTE   LOWER EXTREMITY LYMPHEDEMA Patient Name: Emily Wood MRN: [id number] DOB:05-19-1951, 72 y.o., female Today's Date: 12/28/2022  END OF SESSION:   OT End of Session - 12/28/22 0813     Visit Number 14    Number of Visits 36    Date for OT Re-Evaluation 02/01/23    OT Stop Time 0800    Activity Tolerance Patient tolerated treatment well;No increased pain   Eval limited by Pt arriving late   Behavior During Therapy [facility] for tasks assessed/performed               Past Medical History:  Diagnosis Date   Arthritis    Diabetes mellitus without complication (HCC)    Family history of adverse reaction to anesthesia    PONV   Graves disease 04/14/2020   Hypertension    Lymphedema    Mixed hyperlipidemia 02/20/2021   Past Surgical History:  Procedure Laterality Date   CATARACT EXTRACTION W/PHACO Right 04/14/2022   Procedure: CATARACT EXTRACTION PHACO AND INTRAOCULAR LENS PLACEMENT (IOC) RIGHT DIABETIC 10.93 00:53.0;  Surgeon: Galen Manila, MD;  Location: [facility];  Service: Ophthalmology;  Laterality: Right;   CATARACT EXTRACTION W/PHACO Left 04/28/2022   Procedure: CATARACT EXTRACTION PHACO AND INTRAOCULAR LENS PLACEMENT (IOC) LEFT DIABETIC 6.05 00:45.7;  Surgeon: Galen Manila, MD;  Location: [facility];  Service: Ophthalmology;  Laterality: Left;  Diabetic   CHOLECYSTECTOMY     HERNIA REPAIR     OTHER SURGICAL HISTORY     scar tissue removal from bowels   PARTIAL HYSTERECTOMY     Still has ovaries   REVERSE SHOULDER ARTHROPLASTY Right 09/11/2022   Procedure: REVERSE SHOULDER ARTHROPLASTY;  Surgeon: Yolonda Kida, MD;  Location: [facility];  Service: Orthopedics;  Laterality: Right;  120   UTERINE FIBROID SURGERY     Patient Active Problem List   Diagnosis Date Noted   Normocytic anemia 05/23/2022   AKI (acute kidney injury) (HCC) 05/21/2022   Snoring 11/05/2021   Daytime somnolence  11/05/2021   Coronary artery disease involving native coronary artery of native heart without angina pectoris 05/15/2021   Nonrheumatic tricuspid valve regurgitation 05/15/2021   Pulmonary hypertension, unspecified (HCC) 05/15/2021   Mixed hyperlipidemia 02/20/2021   Obesity (BMI 30-39.9) 02/20/2021   Diabetic retinopathy (HCC) 08/28/2020   Mixed incontinence 05/31/2020   Graves disease 04/14/2020   Right renal mass 11/22/2019   Arthritis of left knee 09/25/2019   Lymphedema 01/25/2017   Fibroid uterus 01/14/2017   Chronic kidney disease, stage 3 (HCC) 09/21/2016   Meralgia paresthetica of left side 08/27/2015   Diabetes mellitus type 2, controlled (HCC) 08/02/2015   Chronic venous insufficiency 06/28/2015   Essential hypertension 02/15/2015   Peripheral edema 02/15/2015   Chronic knee pain 02/15/2015    PCP: Pearline Cables, MD  REFERRING PROVIDER: Sheppard Plumber, NP  REFERRING DIAG: I89.0  THERAPY DIAG:  Lymphedema, not elsewhere classified  Rationale for Evaluation and Treatment: Rehabilitation  ONSET DATE: insidious onset >30 years  SUBJECTIVE:  SUBJECTIVE STATEMENT: Emily Wood presents to Occupational Therapy for treatment of BLE lymphedema. She arrives with knee length, multilayer compression wraps in place on RLE. She is unaccompanied and walks to clinic. Pt denies LE-related leg pain this morning. She reports that shoulder PT is helping w AROM, "although I still can't do my bra".   PERTINENT HISTORY:  Medical Hx contributing to, or affected by chronic, progressive lymphedema: Norvasc prescription-common side effect is leg swelling; HTN, CVI, Obesity, CAD, OA knees, CKD stage 3, Graves Disease, DM  PAIN:  Are you having pain? Yes: B arthritis knees pain;  un rated Pain  description: throbbing, tight, heavy Aggravating factors: standing , walking,  Relieving factors: "The medication from my cardiologist", elevation, compression stockings ( Pt has good hip AROM bilaterally and is able to don them herself w extra time in clinic.)  PRECAUTIONS: Fall and Other: LYMPHEDEMA PRECAUTIONS: (CKD, CAD and DM)   WEIGHT BEARING RESTRICTIONS: No  FALLS:  Has patient fallen since last visit? NO    HAND DOMINANCE: right   PRIOR LEVEL OF FUNCTION: Requires assistive device for independence, Needs assistance with homemaking, and Needs assistance with gait  PATIENT GOALS: ".... to get this lymphedema under control. It's embarrassing. That's why I wear long skirts."   OBJECTIVE:   Mild, Stage  II, Bilateral Lower Extremity Lymphedema 2/2 CVI and Obesity  OBSERVATIONS / OTHER ASSESSMENTS:   Lymphedema Life Impact Scape (LLIS): Intake 11/03/22: 50% (The extent to which L:E-related problems affected your life during the past week)  FOTO functional outcome scale: Intake 11/16/22: 63%  BLE COMPARATIVE LIMB VOLUMETRICS:   Intake measurements completed 10/22/22  LANDMARK RIGHT   R LEG (A-D) 5043.4 ml  R THIGH (E-G) 4182.3 ml  R FULL LIMB (A-G) 7643.8 ml  Limb Volume differential (LVD)   Leg LVD =12.14%, L>R Thigh LVD 8.3%, L>R Full limb LVD= 8.3%   Volume change since initial %  Volume change overall V  (Blank rows = not tested)  LANDMARK LEFT    R LEG (A-D) 5656.0 ml  R THIGH (E-G) 4529.5 ml  R FULL LIMB (A-G) 10185.5 ml  Limb Volume differential (LVD)  %  Volume change since initial %  Volume change overall %  (Blank rows = not tested)   9th Visit: 12/07/22  [facility] RIGHT   R LEG (A-D) 5116.3 ml  R THIGH (E-G) 5047.6  ml  R FULL LIMB (A-G) 10163.9  ml  Limb Volume differential (LVD)    Volume change since initial R LEG volume INCREASED by 1.4%;  R THIGH INCREASED by 20.7%, and R FULL LIMB INCREASED by 10.17% since initial on 10/22/22.  Volume change  overall V  (Blank rows = not tested)   TODAY'S TREATMENT:  MLD to RLE as established with simultaneous skin care RLE Multilayer, knee length, compression wraps                        PATIENT EDUCATION:  Pt edu for LE self care:  Continued Pt/ CG edu for lymphedema self care home program throughout session. Topics include outcome of comparative limb volumetrics- starting limb volume differentials (LVDs), technology and gradient techniques used for short stretch, multilayer compression wrapping, simple self-MLD, therapeutic lymphatic pumping exercises, skin/nail care, LE precautions,. compression garment recommendations and specifications, wear and care schedule and compression garment donning / doffing w assistive devices. Discussed progress towards all OT goals since commencing CDT. All questions answered to the Pt's satisfaction. Good return. Person  educated: Patient Education method: Explanation, Demonstration, Tactile cues, Verbal cues, and Handouts Education comprehension: verbalized understanding, returned demonstration, and needs further education  HOME EXERCISE PROGRAM: Intensive Phase Complete Decongestive Therapy (CDT) includes Daily Manual lymphatic drainage: Short neck sequence bilaterally w diaphragmatic breathing to engage deep abdominal lymphatics. Stationary J strokes to inguinal LN, then proximal to distal dynamic J strokes to thigh, "bottleneck" region, leg and foot. Finally 3 retrograde sweeps back to terminus. Daily Skin care w/ low ph lotion matching skin ph to limit infection risk and improve tissue flexibility and excursion Daily, BLE lymphatic pumping therex- seated or supine Daily (23/7) multilayer compression bandages below the knee to reduce limb volume, one limb at a time (One each 8 cm, 10 cm and 12 cm wide x 5 meters long short stretch compression wrap applied circumferentially using gradient layering techniques over single layer of cotton stockinett an Rosidal foam-  toes to popliteal fossa  Self-management Phase CDT includes: All of the above and Appropriate daytime compression garments and HOS devices full time daily. Replace q 3-6 months Elvarex, flat knit, ccl 225-32 mmHg) knee highs w open toe, t heel, oblique top edge, 2.5 cm wide SB on top, tricot patch at anterior ankle sewn on all 4 sides. To be work full time waking hours. [employer]st RELAX knee high HOS device  Custom-made gradient compression garments and HOS devices are medically necessary in this case because they are uniquely sized and shaped to fit the exact dimensions of the affected extremities with deformities, and to provide accurate and consistent gradient compression and containment, essential to optimally managing this patient's symptoms of chronic, progressive lymphedema. Multiple custom compression garments are needed for optimal hygiene to limit infection risk. Custom compression garments should be replaced q 3-6 months When worn consistently for optimal lipo-lymphedema self-management over time.   ASSESSMENT:  CLINICAL IMPRESSION: Pt tolerated RLE/RLQ MLD, including deeper fibrosis techniques to distal leg, ankle and dorsal foot. Multilayer compression wraps applied below the knee on R. Pt denies increased pain with Rx. Once custom RLE compression garment/ devices are fitted, Pt will transition to self-management CDT on R and we'll commence Intensive CDT on the L. Cont as per POC.  OBJECTIVE IMPAIRMENTS: low vision, Abnormal gait, decreased activity tolerance, decreased balance, decreased endurance, decreased knowledge of condition, decreased knowledge of use of DME, decreased mobility, decreased ROM, increased edema, impaired flexibility, postural dysfunction, obesity, and pain.   ACTIVITY LIMITATIONS: functional mobility and ambulation (carrying, lifting, bending, sitting, standing, squatting, sleeping, stairs, transfers, bed mobility, continence, Basic and instrumental ADLs (bathing,  dressing, hygiene/grooming, Productive activities ( caring for others) and leisure pursuits, body image, and social participation  PERSONAL FACTORS:  multiple co morbidities, length of time with progressive condition, are also affecting patient's functional outcome.   REHAB POTENTIAL: Fair    EVALUATION COMPLEXITY: Moderate   GOALS: Goals reviewed with patient? Yes  SHORT TERM GOALS: Target date: 4th OT Rx visit   Pt will demonstrate understanding of lymphedema precautions and prevention strategies with modified independence using a printed reference to identify at least 5 precautions and discussing how s/he may implement them into daily life to reduce risk of progression with extra time (Modified Independence). Baseline:Max A Goal status: 12/07/22 GOAL MET  2.  Pt will be able to apply multilayer, thigh length, gradient, compression wraps to one leg at a time with modified assistance (extra time) to decrease limb volume, to limit infection risk, and to limit lymphedema progression.  Baseline: Dependent Goal status: 11/16/22  PROGRESSING 11/26/22 GOAL MET  LONG TERM GOALS: Target date: 02/01/23  Given this patient's Intake score of 63/100% on the functional outcomes FOTO tool, patient will experience an increase in function of 3 points to improve basic and instrumental ADLs performance, including lymphedema self-care.  Baseline: ONGOING   Goal status:12/07/22 GOAL ONGOING  2.  Given this patient's Intake score of 50.0 % on the Lymphedema Life Impact Scale (LLIS), patient will experience a reduction of at least 5 points in her perceived level of functional impairment resulting from lymphedema to improve functional performance and quality of life (QOL). Baseline: 50% Goal status: 12/07/22 GOAL ONGOING  3.  During Intensive phase CDT Pt will achieve at least 85% compliance with all lymphedema self-care home program components, including daily skin care, compression wraps and /or garments, simple  self MLD and lymphatic pumping therex to habituate LE self care protocol  into ADLs for optimal LE self-management over time. Baseline: Dependent Goal status: 12/08/22 ONGOING. Pt not changing wraps daily as directed, but will work on increasing elevation anytime she is seated for next progress report. Pt will also increase frequency of re-wrapping her leg to ensure optimal volume reduction.   4.  Pt will achieve at least a 10% volume reduction below the knees to return limb to typical size and shape, to limit infection risk and LE progression, to decrease pain, to improve function. Baseline: dependent Goal status: 12/07/22 PROGRESSING  5.  Pt will obtain proper compression garments/compression braces and be modified independent with donning/doffing to optimize/stabilize with fluid reduction. Baseline:  Goal status: 12/07/22 PROGRESSING  PLAN:  PT FREQUENCY: 2x/week  PT DURATION: 12 weeks  PLANNED INTERVENTIONS: Therapeutic exercises, Therapeutic activity, Patient/Family education, Self Care, Manual lymph drainage, Compression bandaging, scar mobilization, Manual therapy, and skin care throughout MLD  PLAN FOR NEXT SESSION:  Knee length compression wraps to RLE below the knee Cont Pt edu for LE self care Continue MLD to RLE  Loel Dubonnet, MS, OTR/L, CLT-LANA 12/28/22 8:13 AM`

## 2022-12-29 DIAGNOSIS — H2000 Unspecified acute and subacute iridocyclitis: Secondary | ICD-10-CM | POA: Diagnosis not present

## 2022-12-29 DIAGNOSIS — H30023 Focal chorioretinal inflammation of posterior pole, bilateral: Secondary | ICD-10-CM | POA: Diagnosis not present

## 2022-12-29 DIAGNOSIS — E113311 Type 2 diabetes mellitus with moderate nonproliferative diabetic retinopathy with macular edema, right eye: Secondary | ICD-10-CM | POA: Diagnosis not present

## 2022-12-30 ENCOUNTER — Ambulatory Visit
Admission: RE | Admit: 2022-12-30 | Discharge: 2022-12-30 | Disposition: A | Discharge status: Home / Self Care | Payer: Medicare Other | Source: Ambulatory Visit | Attending: Ophthalmology | Admitting: Ophthalmology

## 2022-12-30 ENCOUNTER — Other Ambulatory Visit: Payer: Self-pay | Admitting: Ophthalmology

## 2022-12-30 ENCOUNTER — Ambulatory Visit: Payer: Medicare Other | Admitting: Occupational Therapy

## 2022-12-30 ENCOUNTER — Ambulatory Visit
Admission: RE | Admit: 2022-12-30 | Discharge: 2022-12-30 | Disposition: A | Discharge status: Home / Self Care | Payer: Medicare Other | Source: Home / Self Care | Attending: Ophthalmology | Admitting: Ophthalmology

## 2022-12-30 ENCOUNTER — Other Ambulatory Visit
Admission: RE | Admit: 2022-12-30 | Discharge: 2022-12-30 | Disposition: A | Discharge status: Home / Self Care | Payer: Medicare Other | Source: Home / Self Care | Attending: Ophthalmology | Admitting: Ophthalmology

## 2022-12-30 ENCOUNTER — Encounter: Payer: Self-pay | Admitting: Occupational Therapy

## 2022-12-30 DIAGNOSIS — R918 Other nonspecific abnormal finding of lung field: Secondary | ICD-10-CM | POA: Diagnosis not present

## 2022-12-30 DIAGNOSIS — H209 Unspecified iridocyclitis: Secondary | ICD-10-CM | POA: Insufficient documentation

## 2022-12-30 DIAGNOSIS — I890 Lymphedema, not elsewhere classified: Secondary | ICD-10-CM

## 2022-12-30 LAB — CBC WITH DIFFERENTIAL/PLATELET
Abs Immature Granulocytes: 0.01 10*3/uL (ref 0.00–0.07)
Basophils Absolute: 0 10*3/uL (ref 0.0–0.1)
Basophils Relative: 0 %
Eosinophils Absolute: 0.6 10*3/uL — ABNORMAL HIGH (ref 0.0–0.5)
Eosinophils Relative: 13 %
HCT: 31.6 % — ABNORMAL LOW (ref 36.0–46.0)
Hemoglobin: 9.8 g/dL — ABNORMAL LOW (ref 12.0–15.0)
Immature Granulocytes: 0 %
Lymphocytes Relative: 20 %
Lymphs Abs: 1 10*3/uL (ref 0.7–4.0)
MCH: 27.5 pg (ref 26.0–34.0)
MCHC: 31 g/dL (ref 30.0–36.0)
MCV: 88.8 fL (ref 80.0–100.0)
Monocytes Absolute: 0.4 10*3/uL (ref 0.1–1.0)
Monocytes Relative: 8 %
Neutro Abs: 2.9 10*3/uL (ref 1.7–7.7)
Neutrophils Relative %: 59 %
Platelets: 170 10*3/uL (ref 150–400)
RBC: 3.56 MIL/uL — ABNORMAL LOW (ref 3.87–5.11)
RDW: 14.3 % (ref 11.5–15.5)
WBC: 4.9 10*3/uL (ref 4.0–10.5)
nRBC: 0 % (ref 0.0–0.2)

## 2022-12-30 NOTE — Therapy (Signed)
OUTPATIENT OCCUPATIONAL THERAPY TREATMENT NOTE   LOWER EXTREMITY LYMPHEDEMA Patient Name: Emily Wood MRN: [id number] DOB:[birth year], 72 y.o., female Today's Date: 12/30/2022  END OF SESSION:   OT End of Session - 12/30/22 0801     Visit Number 15    Number of Visits 36    Date for OT Re-Evaluation 02/01/23    OT Start Time 0801    OT Stop Time 0903    OT Time Calculation (min) 62 min    Activity Tolerance Patient tolerated treatment well;No increased pain   Eval limited by Pt arriving late   Behavior During Therapy [facility] for tasks assessed/performed               Past Medical History:  Diagnosis Date   Arthritis    Diabetes mellitus without complication (HCC)    Family history of adverse reaction to anesthesia    PONV   Graves disease 04/14/2020   Hypertension    Lymphedema    Mixed hyperlipidemia 02/20/2021   Past Surgical History:  Procedure Laterality Date   CATARACT EXTRACTION W/PHACO Right 04/14/2022   Procedure: CATARACT EXTRACTION PHACO AND INTRAOCULAR LENS PLACEMENT (IOC) RIGHT DIABETIC 10.93 00:53.0;  Surgeon: Galen Manila, MD;  Location: [facility];  Service: Ophthalmology;  Laterality: Right;   CATARACT EXTRACTION W/PHACO Left 04/28/2022   Procedure: CATARACT EXTRACTION PHACO AND INTRAOCULAR LENS PLACEMENT (IOC) LEFT DIABETIC 6.05 00:45.7;  Surgeon: Galen Manila, MD;  Location: [facility];  Service: Ophthalmology;  Laterality: Left;  Diabetic   CHOLECYSTECTOMY     HERNIA REPAIR     OTHER SURGICAL HISTORY     scar tissue removal from bowels   PARTIAL HYSTERECTOMY     Still has ovaries   REVERSE SHOULDER ARTHROPLASTY Right 09/11/2022   Procedure: REVERSE SHOULDER ARTHROPLASTY;  Surgeon: Yolonda Kida, MD;  Location: [facility];  Service: Orthopedics;  Laterality: Right;  120   UTERINE FIBROID SURGERY     Patient Active Problem List   Diagnosis Date Noted   Normocytic anemia 05/23/2022   AKI (acute kidney injury)  (HCC) 05/21/2022   Snoring 11/05/2021   Daytime somnolence 11/05/2021   Coronary artery disease involving native coronary artery of native heart without angina pectoris 05/15/2021   Nonrheumatic tricuspid valve regurgitation 05/15/2021   Pulmonary hypertension, unspecified (HCC) 05/15/2021   Mixed hyperlipidemia 02/20/2021   Obesity (BMI 30-39.9) 02/20/2021   Diabetic retinopathy (HCC) 08/28/2020   Mixed incontinence 05/31/2020   Graves disease 04/14/2020   Right renal mass 11/22/2019   Arthritis of left knee 09/25/2019   Lymphedema 01/25/2017   Fibroid uterus 01/14/2017   Chronic kidney disease, stage 3 (HCC) 09/21/2016   Meralgia paresthetica of left side 08/27/2015   Diabetes mellitus type 2, controlled (HCC) 08/02/2015   Chronic venous insufficiency 06/28/2015   Essential hypertension 02/15/2015   Peripheral edema 02/15/2015   Chronic knee pain 02/15/2015    PCP: Pearline Cables, MD  REFERRING PROVIDER: Sheppard Plumber, NP  REFERRING DIAG: I89.0  THERAPY DIAG:  Lymphedema, not elsewhere classified  Rationale for Evaluation and Treatment: Rehabilitation  ONSET DATE: insidious onset >30 years  SUBJECTIVE:  SUBJECTIVE STATEMENT: Emily Wood presents to Occupational Therapy for treatment of BLE lymphedema. She arrives with knee length, multilayer compression wraps in place on RLE. She is unaccompanied and walks to clinic. Pt denies LE-related leg pain this morning. Pt has no new complaints.  PERTINENT HISTORY:  Medical Hx contributing to, or affected by chronic, progressive lymphedema: Norvasc prescription-common side effect is leg swelling; HTN, CVI, Obesity, CAD, OA knees, CKD stage 3, Graves Disease, DM  PAIN:  Are you having pain? Yes: B arthritis knees pain;  un rated  numerically/10 Pain description: throbbing, tight, heavy Aggravating factors: standing , walking,  Relieving factors: "The medication from my cardiologist", elevation, compression stockings ( Pt has good hip AROM bilaterally and is able to don them herself w extra time in clinic.)  PRECAUTIONS: Fall and Other: LYMPHEDEMA PRECAUTIONS: (CKD, CAD and DM)   WEIGHT BEARING RESTRICTIONS: No  FALLS:  Has patient fallen since last visit? NO    HAND DOMINANCE: right   PRIOR LEVEL OF FUNCTION: Requires assistive device for independence, Needs assistance with homemaking, and Needs assistance with gait  PATIENT GOALS: ".... to get this lymphedema under control. It's embarrassing. That's why I wear long skirts."   OBJECTIVE:   Mild, Stage  II, Bilateral Lower Extremity Lymphedema 2/2 CVI and Obesity  OBSERVATIONS / OTHER ASSESSMENTS:   Lymphedema Life Impact Scape (LLIS): Intake 11/03/22: 50% (The extent to which L:E-related problems affected your life during the past week)  FOTO functional outcome scale: Intake 11/16/22: 63%  BLE COMPARATIVE LIMB VOLUMETRICS:   Intake measurements completed 10/22/22  LANDMARK RIGHT   R LEG (A-D) 5043.4 ml  R THIGH (E-G) 4182.3 ml  R FULL LIMB (A-G) 7643.8 ml  Limb Volume differential (LVD)   Leg LVD =12.14%, L>R Thigh LVD 8.3%, L>R Full limb LVD= 8.3%   Volume change since initial %  Volume change overall V  (Blank rows = not tested)  LANDMARK LEFT    R LEG (A-D) 5656.0 ml  R THIGH (E-G) 4529.5 ml  R FULL LIMB (A-G) 10185.5 ml  Limb Volume differential (LVD)  %  Volume change since initial %  Volume change overall %  (Blank rows = not tested)   9th Visit: 12/07/22  [facility] RIGHT   R LEG (A-D) 5116.3 ml  R THIGH (E-G) 5047.6  ml  R FULL LIMB (A-G) 10163.9  ml  Limb Volume differential (LVD)    Volume change since initial R LEG volume INCREASED by 1.4%;  R THIGH INCREASED by 20.7%, and R FULL LIMB INCREASED by 10.17% since initial on  10/22/22.  Volume change overall V  (Blank rows = not tested)   TODAY'S TREATMENT:  MLD to RLE as established with simultaneous skin care RLE Multilayer, knee length, compression wraps                        PATIENT EDUCATION:  Pt edu for LE self care:  Continued Pt/ CG edu for lymphedema self care home program throughout session. Topics include outcome of comparative limb volumetrics- starting limb volume differentials (LVDs), technology and gradient techniques used for short stretch, multilayer compression wrapping, simple self-MLD, therapeutic lymphatic pumping exercises, skin/nail care, LE precautions,. compression garment recommendations and specifications, wear and care schedule and compression garment donning / doffing w assistive devices. Discussed progress towards all OT goals since commencing CDT. All questions answered to the Pt's satisfaction. Good return. Person educated: Patient Education Wood: Explanation, Demonstration, Tactile cues, Verbal cues, and  Handouts Education comprehension: verbalized understanding, returned demonstration, and needs further education  HOME EXERCISE PROGRAM: Intensive Phase Complete Decongestive Therapy (CDT) includes Daily Manual lymphatic drainage: Short neck sequence bilaterally w diaphragmatic breathing to engage deep abdominal lymphatics. Stationary J strokes to inguinal LN, then proximal to distal dynamic J strokes to thigh, "bottleneck" region, leg and foot. Finally 3 retrograde sweeps back to terminus. Daily Skin care w/ low ph lotion matching skin ph to limit infection risk and improve tissue flexibility and excursion Daily, BLE lymphatic pumping therex- seated or supine Daily (23/7) multilayer compression bandages below the knee to reduce limb volume, one limb at a time (One each 8 cm, 10 cm and 12 cm wide x 5 meters long short stretch compression wrap applied circumferentially using gradient layering techniques over single layer of cotton  stockinett an Rosidal foam- toes to popliteal fossa  Self-management Phase CDT includes: All of the above and Appropriate daytime compression garments and HOS devices full time daily. Replace q 3-6 months Elvarex, flat knit, ccl 225-32 mmHg) knee highs w open toe, t heel, oblique top edge, 2.5 cm wide SB on top, tricot patch at anterior ankle sewn on all 4 sides. To be work full time waking hours. Jobst RELAX knee high HOS device  Custom-made gradient compression garments and HOS devices are medically necessary in this case because they are uniquely sized and shaped to fit the exact dimensions of the affected extremities with deformities, and to provide accurate and consistent gradient compression and containment, essential to optimally managing this patient's symptoms of chronic, progressive lymphedema. Multiple custom compression garments are needed for optimal hygiene to limit infection risk. Custom compression garments should be replaced q 3-6 months When worn consistently for optimal lipo-lymphedema self-management over time.   ASSESSMENT:  CLINICAL IMPRESSION: Continued reviewing simple self MLD. Pt still needs moderate assistance to perform J stroke and short neck sequence correctly. Pt tolerated RLE/RLQ MLD, including deeper fibrosis techniques to distal leg, ankle and dorsal foot. Multilayer compression wraps applied below the knee on R. Pt denies increased pain with Rx. Once custom RLE compression garment/ devices are fitted, Pt will transition to self-management CDT on R and we'll commence Intensive CDT on the L. Cont as per POC.  OBJECTIVE IMPAIRMENTS: low vision, Abnormal gait, decreased activity tolerance, decreased balance, decreased endurance, decreased knowledge of condition, decreased knowledge of use of DME, decreased mobility, decreased ROM, increased edema, impaired flexibility, postural dysfunction, obesity, and pain.   ACTIVITY LIMITATIONS: functional mobility and ambulation  (carrying, lifting, bending, sitting, standing, squatting, sleeping, stairs, transfers, bed mobility, continence, Basic and instrumental ADLs (bathing, dressing, hygiene/grooming, Productive activities ( caring for others) and leisure pursuits, body image, and social participation  PERSONAL FACTORS:  multiple co morbidities, length of time with progressive condition, are also affecting patient's functional outcome.   REHAB POTENTIAL: Fair    EVALUATION COMPLEXITY: Moderate   GOALS: Goals reviewed with patient? Yes  SHORT TERM GOALS: Target date: 4th OT Rx visit   Pt will demonstrate understanding of lymphedema precautions and prevention strategies with modified independence using a printed reference to identify at least 5 precautions and discussing how s/he may implement them into daily life to reduce risk of progression with extra time (Modified Independence). Baseline:Max A Goal status: 12/07/22 GOAL MET  2.  Pt will be able to apply multilayer, thigh length, gradient, compression wraps to one leg at a time with modified assistance (extra time) to decrease limb volume, to limit infection risk, and to limit  lymphedema progression.  Baseline: Dependent Goal status: 11/16/22 PROGRESSING 11/26/22 GOAL MET  LONG TERM GOALS: Target date: 02/01/23  Given this patient's Intake score of 63/100% on the functional outcomes FOTO tool, patient will experience an increase in function of 3 points to improve basic and instrumental ADLs performance, including lymphedema self-care.  Baseline: ONGOING   Goal status:12/07/22 GOAL ONGOING  2.  Given this patient's Intake score of 50.0 % on the Lymphedema Life Impact Scale (LLIS), patient will experience a reduction of at least 5 points in her perceived level of functional impairment resulting from lymphedema to improve functional performance and quality of life (QOL). Baseline: 50% Goal status: 12/07/22 GOAL ONGOING  3.  During Intensive phase CDT Pt will  achieve at least 85% compliance with all lymphedema self-care home program components, including daily skin care, compression wraps and /or garments, simple self MLD and lymphatic pumping therex to habituate LE self care protocol  into ADLs for optimal LE self-management over time. Baseline: Dependent Goal status: 12/08/22 ONGOING. Pt not changing wraps daily as directed, but will work on increasing elevation anytime she is seated for next progress report. Pt will also increase frequency of re-wrapping her leg to ensure optimal volume reduction.   4.  Pt will achieve at least a 10% volume reduction below the knees to return limb to typical size and shape, to limit infection risk and LE progression, to decrease pain, to improve function. Baseline: dependent Goal status: 12/07/22 PROGRESSING  5.  Pt will obtain proper compression garments/compression braces and be modified independent with donning/doffing to optimize/stabilize with fluid reduction. Baseline:  Goal status: 12/07/22 PROGRESSING  PLAN:  PT FREQUENCY: 2x/week  PT DURATION: 12 weeks  PLANNED INTERVENTIONS: Therapeutic exercises, Therapeutic activity, Patient/Family education, Self Care, Manual lymph drainage, Compression bandaging, scar mobilization, Manual therapy, and skin care throughout MLD  PLAN FOR NEXT SESSION:  Knee length compression wraps to RLE below the knee Cont Pt edu for LE self care Continue MLD to RLE  Loel Dubonnet, MS, OTR/L, CLT-LANA 12/30/22 9:07 AM`

## 2022-12-31 LAB — ANGIOTENSIN CONVERTING ENZYME: Angiotensin-Converting Enzyme: 69 U/L (ref 14–82)

## 2022-12-31 LAB — ANCA PROFILE
Anti-MPO Antibodies: <0.2 units (ref 0.0–0.9)
Anti-PR3 Antibodies: <0.2 units (ref 0.0–0.9)
Atypical P-ANCA titer: <1:20 {titer}
C-ANCA: <1:20 {titer}
P-ANCA: <1:20 {titer}

## 2022-12-31 LAB — RPR: RPR Ser Ql: NONREACTIVE

## 2023-01-04 ENCOUNTER — Ambulatory Visit: Payer: Medicare Other | Admitting: Occupational Therapy

## 2023-01-04 DIAGNOSIS — H209 Unspecified iridocyclitis: Secondary | ICD-10-CM | POA: Diagnosis not present

## 2023-01-04 DIAGNOSIS — E1122 Type 2 diabetes mellitus with diabetic chronic kidney disease: Secondary | ICD-10-CM | POA: Diagnosis not present

## 2023-01-04 DIAGNOSIS — I890 Lymphedema, not elsewhere classified: Secondary | ICD-10-CM | POA: Diagnosis not present

## 2023-01-04 DIAGNOSIS — N1831 Chronic kidney disease, stage 3a: Secondary | ICD-10-CM | POA: Diagnosis not present

## 2023-01-04 DIAGNOSIS — E0500 Thyrotoxicosis with diffuse goiter without thyrotoxic crisis or storm: Secondary | ICD-10-CM | POA: Diagnosis not present

## 2023-01-04 NOTE — Therapy (Signed)
OUTPATIENT OCCUPATIONAL THERAPY TREATMENT NOTE   LOWER EXTREMITY LYMPHEDEMA Patient Name: Emily Wood MRN: [id number] DOB:26-Dec-1950, 72 y.o., female Today's Date: 01/04/2023  END OF SESSION:   OT End of Session - 01/04/23 0811     Visit Number 16    Number of Visits 36    Date for OT Re-Evaluation 02/01/23    OT Start Time 0804    OT Stop Time 0904    OT Time Calculation (min) 60 min    Activity Tolerance Patient tolerated treatment well;No increased pain   Eval limited by Pt arriving late   Behavior During Therapy [facility] for tasks assessed/performed               Past Medical History:  Diagnosis Date   Arthritis    Diabetes mellitus without complication (HCC)    Family history of adverse reaction to anesthesia    PONV   Graves disease 04/14/2020   Hypertension    Lymphedema    Mixed hyperlipidemia 02/20/2021   Past Surgical History:  Procedure Laterality Date   CATARACT EXTRACTION W/PHACO Right 04/14/2022   Procedure: CATARACT EXTRACTION PHACO AND INTRAOCULAR LENS PLACEMENT (IOC) RIGHT DIABETIC 10.93 00:53.0;  Surgeon: Galen Manila, MD;  Location: [facility];  Service: Ophthalmology;  Laterality: Right;   CATARACT EXTRACTION W/PHACO Left 04/28/2022   Procedure: CATARACT EXTRACTION PHACO AND INTRAOCULAR LENS PLACEMENT (IOC) LEFT DIABETIC 6.05 00:45.7;  Surgeon: Galen Manila, MD;  Location: [facility];  Service: Ophthalmology;  Laterality: Left;  Diabetic   CHOLECYSTECTOMY     HERNIA REPAIR     OTHER SURGICAL HISTORY     scar tissue removal from bowels   PARTIAL HYSTERECTOMY     Still has ovaries   REVERSE SHOULDER ARTHROPLASTY Right 09/11/2022   Procedure: REVERSE SHOULDER ARTHROPLASTY;  Surgeon: Yolonda Kida, MD;  Location: [facility];  Service: Orthopedics;  Laterality: Right;  120   UTERINE FIBROID SURGERY     Patient Active Problem List   Diagnosis Date Noted   Normocytic anemia 05/23/2022   AKI (acute kidney injury)  (HCC) 05/21/2022   Snoring 11/05/2021   Daytime somnolence 11/05/2021   Coronary artery disease involving native coronary artery of native heart without angina pectoris 05/15/2021   Nonrheumatic tricuspid valve regurgitation 05/15/2021   Pulmonary hypertension, unspecified (HCC) 05/15/2021   Mixed hyperlipidemia 02/20/2021   Obesity (BMI 30-39.9) 02/20/2021   Diabetic retinopathy (HCC) 08/28/2020   Mixed incontinence 05/31/2020   Graves disease 04/14/2020   Right renal mass 11/22/2019   Arthritis of left knee 09/25/2019   Lymphedema 01/25/2017   Fibroid uterus 01/14/2017   Chronic kidney disease, stage 3 (HCC) 09/21/2016   Meralgia paresthetica of left side 08/27/2015   Diabetes mellitus type 2, controlled (HCC) 08/02/2015   Chronic venous insufficiency 06/28/2015   Essential hypertension 02/15/2015   Peripheral edema 02/15/2015   Chronic knee pain 02/15/2015    PCP: Pearline Cables, MD  REFERRING PROVIDER: Sheppard Plumber, NP  REFERRING DIAG: I89.0  THERAPY DIAG:  Lymphedema, not elsewhere classified  Rationale for Evaluation and Treatment: Rehabilitation  ONSET DATE: insidious onset >30 years  SUBJECTIVE:  SUBJECTIVE STATEMENT: Emily Wood presents to Occupational Therapy for treatment of BLE lymphedema. She arrives with knee length, multilayer compression wraps in place on RLE. She is unaccompanied and walks to clinic. Pt denies LE-related leg pain this morning. She reports 4/10 R OA knee pain 2/2. Pt has no new complaints.  PERTINENT HISTORY:  Medical Hx contributing to, or affected by chronic, progressive lymphedema: Norvasc prescription-common side effect is leg swelling; HTN, CVI, Obesity, CAD, OA knees, CKD stage 3, Graves Disease, DM  PAIN:  Are you having pain? Yes: B  arthritis knees pain;  4/10 Pain description: stiff, aching, throbbing, tight, heavy Aggravating factors: standing , walking,  Relieving factors: "The medication from my cardiologist", elevation, compression stockings ( Pt has good hip AROM bilaterally and is able to don them herself w extra time in clinic.)  PRECAUTIONS: Fall and Other: LYMPHEDEMA PRECAUTIONS: (CKD, CAD and DM)   WEIGHT BEARING RESTRICTIONS: No  FALLS:  Has patient fallen since last visit? NO    HAND DOMINANCE: right   PRIOR LEVEL OF FUNCTION: Requires assistive device for independence, Needs assistance with homemaking, and Needs assistance with gait  PATIENT GOALS: ".... to get this lymphedema under control. It's embarrassing. That's why I wear long skirts."   OBJECTIVE:   Mild, Stage  II, Bilateral Lower Extremity Lymphedema 2/2 CVI and Obesity  OBSERVATIONS / OTHER ASSESSMENTS:   Lymphedema Life Impact Scape (LLIS): Intake 11/03/22: 50% (The extent to which L:E-related problems affected your life during the past week)  FOTO functional outcome scale: Intake 11/16/22: 63%  BLE COMPARATIVE LIMB VOLUMETRICS:   Intake measurements completed 10/22/22  LANDMARK RIGHT   R LEG (A-D) 5043.4 ml  R THIGH (E-G) 4182.3 ml  R FULL LIMB (A-G) 7643.8 ml  Limb Volume differential (LVD)   Leg LVD =12.14%, L>R Thigh LVD 8.3%, L>R Full limb LVD= 8.3%   Volume change since initial %  Volume change overall V  (Blank rows = not tested)  LANDMARK LEFT    R LEG (A-D) 5656.0 ml  R THIGH (E-G) 4529.5 ml  R FULL LIMB (A-G) 10185.5 ml  Limb Volume differential (LVD)  %  Volume change since initial %  Volume change overall %  (Blank rows = not tested)   9th Visit: 12/07/22  [facility] RIGHT   R LEG (A-D) 5116.3 ml  R THIGH (E-G) 5047.6  ml  R FULL LIMB (A-G) 10163.9  ml  Limb Volume differential (LVD)    Volume change since initial R LEG volume INCREASED by 1.4%;  R THIGH INCREASED by 20.7%, and R FULL LIMB INCREASED by  10.17% since initial on 10/22/22.  Volume change overall V  (Blank rows = not tested)   TODAY'S TREATMENT:  MLD to RLE as established with simultaneous skin care RLE Multilayer, knee length, compression wraps                        PATIENT EDUCATION:  Pt edu for LE self care:  Continued Pt/ CG edu for lymphedema self care home program throughout session. Emphasis today on lymphedema precautions and cellulitis ID, prevention and Rx. Discussed again fluid dynamics re elevation, and importance of elevation for lymphedema management and venous return. Other topics include outcome of comparative limb volumetrics- starting limb volume differentials (LVDs), technology and gradient techniques used for short stretch, multilayer compression wrapping, simple self-MLD, therapeutic lymphatic pumping exercises, skin/nail care, LE precautions,. compression garment recommendations and specifications, wear and care schedule and compression garment  donning / doffing w assistive devices. Discussed progress towards all OT goals since commencing CDT. All questions answered to the Pt's satisfaction. Good return. Person educated: Patient Education method: Explanation, Demonstration, Tactile cues, Verbal cues, and Handouts Education comprehension: verbalized understanding, returned demonstration, and needs further education  HOME EXERCISE PROGRAM: Intensive Phase Complete Decongestive Therapy (CDT) includes Daily Manual lymphatic drainage: Short neck sequence bilaterally w diaphragmatic breathing to engage deep abdominal lymphatics. Stationary J strokes to inguinal LN, then proximal to distal dynamic J strokes to thigh, "bottleneck" region, leg and foot. Finally 3 retrograde sweeps back to terminus. Daily Skin care w/ low ph lotion matching skin ph to limit infection risk and improve tissue flexibility and excursion Daily, BLE lymphatic pumping therex- seated or supine Daily (23/7) multilayer compression bandages below  the knee to reduce limb volume, one limb at a time (One each 8 cm, 10 cm and 12 cm wide x 5 meters long short stretch compression wrap applied circumferentially using gradient layering techniques over single layer of cotton stockinett an Rosidal foam- toes to popliteal fossa  Self-management Phase CDT includes: All of the above and Appropriate daytime compression garments and HOS devices full time daily. Replace q 3-6 months Elvarex, flat knit, ccl 225-32 mmHg) knee highs w open toe, t heel, oblique top edge, 2.5 cm wide SB on top, tricot patch at anterior ankle sewn on all 4 sides. To be work full time waking hours. Jobst RELAX knee high HOS device  Custom-made gradient compression garments and HOS devices are medically necessary in this case because they are uniquely sized and shaped to fit the exact dimensions of the affected extremities with deformities, and to provide accurate and consistent gradient compression and containment, essential to optimally managing this patient's symptoms of chronic, progressive lymphedema. Multiple custom compression garments are needed for optimal hygiene to limit infection risk. Custom compression garments should be replaced q 3-6 months When worn consistently for optimal lipo-lymphedema self-management over time.   ASSESSMENT:  CLINICAL IMPRESSION: Continued Pt edu throughout session w emphasis on lymphedema self-care, including lymphedema and cellulitis precautions, skin care and leg elevation.  Pt tolerated RLE/RLQ MLD, including deeper fibrosis techniques to distal leg, ankle and dorsal foot. Multilayer compression wraps applied below the knee on R. Pt denies increased pain with Rx. Once custom RLE compression garment/ devices are fitted, Pt will transition to self-management CDT on R and we'll commence Intensive CDT on the L. Cont as per POC.  OBJECTIVE IMPAIRMENTS: low vision, Abnormal gait, decreased activity tolerance, decreased balance, decreased endurance,  decreased knowledge of condition, decreased knowledge of use of DME, decreased mobility, decreased ROM, increased edema, impaired flexibility, postural dysfunction, obesity, and pain.   ACTIVITY LIMITATIONS: functional mobility and ambulation (carrying, lifting, bending, sitting, standing, squatting, sleeping, stairs, transfers, bed mobility, continence, Basic and instrumental ADLs (bathing, dressing, hygiene/grooming, Productive activities ( caring for others) and leisure pursuits, body image, and social participation  PERSONAL FACTORS:  multiple co morbidities, length of time with progressive condition, are also affecting patient's functional outcome.   REHAB POTENTIAL: Fair    EVALUATION COMPLEXITY: Moderate   GOALS: Goals reviewed with patient? Yes  SHORT TERM GOALS: Target date: 4th OT Rx visit   Pt will demonstrate understanding of lymphedema precautions and prevention strategies with modified independence using a printed reference to identify at least 5 precautions and discussing how s/he may implement them into daily life to reduce risk of progression with extra time (Modified Independence). Baseline:Max A Goal status: 12/07/22 GOAL  MET  2.  Pt will be able to apply multilayer, thigh length, gradient, compression wraps to one leg at a time with modified assistance (extra time) to decrease limb volume, to limit infection risk, and to limit lymphedema progression.  Baseline: Dependent Goal status: 11/16/22 PROGRESSING 11/26/22 GOAL MET  LONG TERM GOALS: Target date: 02/01/23  Given this patient's Intake score of 63/100% on the functional outcomes FOTO tool, patient will experience an increase in function of 3 points to improve basic and instrumental ADLs performance, including lymphedema self-care.  Baseline: ONGOING   Goal status:12/07/22 GOAL ONGOING  2.  Given this patient's Intake score of 50.0 % on the Lymphedema Life Impact Scale (LLIS), patient will experience a reduction of at  least 5 points in her perceived level of functional impairment resulting from lymphedema to improve functional performance and quality of life (QOL). Baseline: 50% Goal status: 12/07/22 GOAL ONGOING  3.  During Intensive phase CDT Pt will achieve at least 85% compliance with all lymphedema self-care home program components, including daily skin care, compression wraps and /or garments, simple self MLD and lymphatic pumping therex to habituate LE self care protocol  into ADLs for optimal LE self-management over time. Baseline: Dependent Goal status: 12/08/22 ONGOING. Pt not changing wraps daily as directed, but will work on increasing elevation anytime she is seated for next progress report. Pt will also increase frequency of re-wrapping her leg to ensure optimal volume reduction.   4.  Pt will achieve at least a 10% volume reduction below the knees to return limb to typical size and shape, to limit infection risk and LE progression, to decrease pain, to improve function. Baseline: dependent Goal status: 12/07/22 PROGRESSING  5.  Pt will obtain proper compression garments/compression braces and be modified independent with donning/doffing to optimize/stabilize with fluid reduction. Baseline:  Goal status: 12/07/22 PROGRESSING  PLAN:  PT FREQUENCY: 2x/week  PT DURATION: 12 weeks  PLANNED INTERVENTIONS: Therapeutic exercises, Therapeutic activity, Patient/Family education, Self Care, Manual lymph drainage, Compression bandaging, scar mobilization, Manual therapy, and skin care throughout MLD  PLAN FOR NEXT SESSION:  Knee length compression wraps to RLE below the knee Cont Pt edu for LE self care Continue MLD to RLE  Loel Dubonnet, MS, OTR/L, CLT-LANA 01/04/23 9:16 AM`

## 2023-01-05 DIAGNOSIS — M25511 Pain in right shoulder: Secondary | ICD-10-CM | POA: Diagnosis not present

## 2023-01-05 LAB — QUANTIFERON-TB GOLD PLUS (RQFGPL)
QuantiFERON Mitogen Value: >10 IU/mL
QuantiFERON Nil Value: 0.01 IU/mL
QuantiFERON TB1 Ag Value: 0.02 IU/mL
QuantiFERON TB2 Ag Value: 0.01 IU/mL

## 2023-01-05 LAB — QUANTIFERON-TB GOLD PLUS: QuantiFERON-TB Gold Plus: NEGATIVE

## 2023-01-06 ENCOUNTER — Ambulatory Visit: Payer: Medicare Other | Admitting: Occupational Therapy

## 2023-01-06 ENCOUNTER — Encounter: Payer: Self-pay | Admitting: Occupational Therapy

## 2023-01-06 DIAGNOSIS — E1122 Type 2 diabetes mellitus with diabetic chronic kidney disease: Secondary | ICD-10-CM | POA: Diagnosis not present

## 2023-01-06 DIAGNOSIS — I890 Lymphedema, not elsewhere classified: Secondary | ICD-10-CM

## 2023-01-06 DIAGNOSIS — N1831 Chronic kidney disease, stage 3a: Secondary | ICD-10-CM | POA: Diagnosis not present

## 2023-01-06 DIAGNOSIS — H209 Unspecified iridocyclitis: Secondary | ICD-10-CM | POA: Diagnosis not present

## 2023-01-06 NOTE — Therapy (Signed)
OUTPATIENT OCCUPATIONAL THERAPY TREATMENT NOTE   LOWER EXTREMITY LYMPHEDEMA Patient Name: Emily Wood MRN: [id number] DOB:06/21/1951, 72 y.o., female Today's Date: 01/06/2023  END OF SESSION:   OT End of Session - 01/06/23 0816     Visit Number 17    Number of Visits 36    Date for OT Re-Evaluation 02/01/23    OT Start Time 0803    OT Stop Time 0915    OT Time Calculation (min) 72 min    Activity Tolerance Patient tolerated treatment well;No increased pain   Eval limited by Pt arriving late   Behavior During Therapy [facility] for tasks assessed/performed               Past Medical History:  Diagnosis Date   Arthritis    Diabetes mellitus without complication (HCC)    Family history of adverse reaction to anesthesia    PONV   Graves disease 04/14/2020   Hypertension    Lymphedema    Mixed hyperlipidemia 02/20/2021   Past Surgical History:  Procedure Laterality Date   CATARACT EXTRACTION W/PHACO Right 04/14/2022   Procedure: CATARACT EXTRACTION PHACO AND INTRAOCULAR LENS PLACEMENT (IOC) RIGHT DIABETIC 10.93 00:53.0;  Surgeon: Galen Manila, MD;  Location: [facility];  Service: Ophthalmology;  Laterality: Right;   CATARACT EXTRACTION W/PHACO Left 04/28/2022   Procedure: CATARACT EXTRACTION PHACO AND INTRAOCULAR LENS PLACEMENT (IOC) LEFT DIABETIC 6.05 00:45.7;  Surgeon: Galen Manila, MD;  Location: [facility];  Service: Ophthalmology;  Laterality: Left;  Diabetic   CHOLECYSTECTOMY     HERNIA REPAIR     OTHER SURGICAL HISTORY     scar tissue removal from bowels   PARTIAL HYSTERECTOMY     Still has ovaries   REVERSE SHOULDER ARTHROPLASTY Right 09/11/2022   Procedure: REVERSE SHOULDER ARTHROPLASTY;  Surgeon: Yolonda Kida, MD;  Location: [facility];  Service: Orthopedics;  Laterality: Right;  120   UTERINE FIBROID SURGERY     Patient Active Problem List   Diagnosis Date Noted   Normocytic anemia 05/23/2022   AKI (acute kidney injury)  (HCC) 05/21/2022   Snoring 11/05/2021   Daytime somnolence 11/05/2021   Coronary artery disease involving native coronary artery of native heart without angina pectoris 05/15/2021   Nonrheumatic tricuspid valve regurgitation 05/15/2021   Pulmonary hypertension, unspecified (HCC) 05/15/2021   Mixed hyperlipidemia 02/20/2021   Obesity (BMI 30-39.9) 02/20/2021   Diabetic retinopathy (HCC) 08/28/2020   Mixed incontinence 05/31/2020   Graves disease 04/14/2020   Right renal mass 11/22/2019   Arthritis of left knee 09/25/2019   Lymphedema 01/25/2017   Fibroid uterus 01/14/2017   Chronic kidney disease, stage 3 (HCC) 09/21/2016   Meralgia paresthetica of left side 08/27/2015   Diabetes mellitus type 2, controlled (HCC) 08/02/2015   Chronic venous insufficiency 06/28/2015   Essential hypertension 02/15/2015   Peripheral edema 02/15/2015   Chronic knee pain 02/15/2015    PCP: Pearline Cables, MD  REFERRING PROVIDER: Sheppard Plumber, NP  REFERRING DIAG: I89.0  THERAPY DIAG:  Lymphedema, not elsewhere classified  Rationale for Evaluation and Treatment: Rehabilitation  ONSET DATE: insidious onset >30 years  SUBJECTIVE:  SUBJECTIVE STATEMENT: [name] presents to Occupational Therapy for treatment of BLE lymphedema. She arrives with knee length, multilayer compression wraps in place on RLE. She is unaccompanied and walks to clinic. Pt denies LE-related leg pain this morning. She reports 4/10 R OA knee pain is unchanged. Pt has no new complaints.  PERTINENT HISTORY:  Medical Hx contributing to, or affected by chronic, progressive lymphedema: Norvasc prescription-common side effect is leg swelling; HTN, CVI, Obesity, CAD, OA knees, CKD stage 3, Graves Disease, DM  PAIN:  Are you having pain? Yes: B  arthritis knees pain;  4/10 Pain description: stiff, aching, throbbing, tight, heavy Aggravating factors: standing , walking,  Relieving factors: "The medication from my cardiologist", elevation, compression stockings ( Pt has good hip AROM bilaterally and is able to don them herself w extra time in clinic.)  PRECAUTIONS: Fall and Other: LYMPHEDEMA PRECAUTIONS: (CKD, CAD and DM, GRAVES )   WEIGHT BEARING RESTRICTIONS: No  FALLS:  Has patient fallen since last visit? NO    HAND DOMINANCE: right   PRIOR LEVEL OF FUNCTION: Requires assistive device for independence, Needs assistance with homemaking, and Needs assistance with gait  PATIENT GOALS: ".... to get this lymphedema under control. It's embarrassing. That's why I wear long skirts."   OBJECTIVE:   Mild, Stage  II, Bilateral Lower Extremity Lymphedema 2/2 CVI and Obesity  OBSERVATIONS / OTHER ASSESSMENTS:   Lymphedema Life Impact Scape (LLIS): Intake 11/03/22: 50% (The extent to which L:E-related problems affected your life during the past week)  FOTO functional outcome scale: Intake 11/16/22: 63%  BLE COMPARATIVE LIMB VOLUMETRICS:   Intake measurements completed 10/22/22  LANDMARK RIGHT   R LEG (A-D) 5043.4 ml  R THIGH (E-G) 4182.3 ml  R FULL LIMB (A-G) 7643.8 ml  Limb Volume differential (LVD)   Leg LVD =12.14%, L>R Thigh LVD 8.3%, L>R Full limb LVD= 8.3%   Volume change since initial %  Volume change overall V  (Blank rows = not tested)  LANDMARK LEFT    R LEG (A-D) 5656.0 ml  R THIGH (E-G) 4529.5 ml  R FULL LIMB (A-G) 10185.5 ml  Limb Volume differential (LVD)  %  Volume change since initial %  Volume change overall %  (Blank rows = not tested)   9th Visit: 12/07/22  [facility] RIGHT   R LEG (A-D) 5116.3 ml  R THIGH (E-G) 5047.6  ml  R FULL LIMB (A-G) 10163.9  ml  Limb Volume differential (LVD)    Volume change since initial R LEG volume INCREASED by 1.4%;  R THIGH INCREASED by 20.7%, and R FULL LIMB  INCREASED by 10.17% since initial on 10/22/22.  Volume change overall V  (Blank rows = not tested)   TODAY'S TREATMENT:  MLD to RLE as established with simultaneous skin care RLE Multilayer, knee length, compression wraps   Pt edu for LE self-care                      PATIENT EDUCATION:  Pt edu for LE self care:  Continued Pt/ CG edu for lymphedema self care home program throughout session. Emphasis today on lymphedema precautions and cellulitis ID, prevention and Rx. Discussed again fluid dynamics re elevation, and importance of elevation for lymphedema management and venous return. Other topics include outcome of comparative limb volumetrics- starting limb volume differentials (LVDs), technology and gradient techniques used for short stretch, multilayer compression wrapping, simple self-MLD, therapeutic lymphatic pumping exercises, skin/nail care, LE precautions,. compression garment recommendations and  specifications, wear and care schedule and compression garment donning / doffing w assistive devices. Discussed progress towards all OT goals since commencing CDT. All questions answered to the Pt's satisfaction. Good return. Person educated: Patient Education method: Explanation, Demonstration, Tactile cues, Verbal cues, and Handouts Education comprehension: verbalized understanding, returned demonstration, and needs further education  HOME EXERCISE PROGRAM: Intensive Phase Complete Decongestive Therapy (CDT) includes Daily Manual lymphatic drainage: Short neck sequence bilaterally w diaphragmatic breathing to engage deep abdominal lymphatics. Stationary J strokes to inguinal LN, then proximal to distal dynamic J strokes to thigh, "bottleneck" region, leg and foot. Finally 3 retrograde sweeps back to terminus. Daily Skin care w/ low ph lotion matching skin ph to limit infection risk and improve tissue flexibility and excursion Daily, BLE lymphatic pumping therex- seated or supine Daily (23/7)  multilayer compression bandages below the knee to reduce limb volume, one limb at a time (One each 8 cm, 10 cm and 12 cm wide x 5 meters long short stretch compression wrap applied circumferentially using gradient layering techniques over single layer of cotton stockinett an Rosidal foam- toes to popliteal fossa  Self-management Phase CDT includes: All of the above and Appropriate daytime compression garments and HOS devices full time daily. Replace q 3-6 months Elvarex, flat knit, ccl 225-32 mmHg) knee highs w open toe, t heel, oblique top edge, 2.5 cm wide SB on top, tricot patch at anterior ankle sewn on all 4 sides. To be work full time waking hours. Jobst RELAX knee high HOS device  Custom-made gradient compression garments and HOS devices are medically necessary in this case because they are uniquely sized and shaped to fit the exact dimensions of the affected extremities with deformities, and to provide accurate and consistent gradient compression and containment, essential to optimally managing this patient's symptoms of chronic, progressive lymphedema. Multiple custom compression garments are needed for optimal hygiene to limit infection risk. Custom compression garments should be replaced q 3-6 months When worn consistently for optimal lipo-lymphedema self-management over time.   ASSESSMENT:  CLINICAL IMPRESSION: Continued Pt edu for self care throughout session.  Pt tolerated RLE/RLQ MLD, including deeper fibrosis techniques to distal leg, ankle and dorsal foot. Multilayer compression wraps applied below the knee on R. Pt denies increased pain with Rx. Once custom RLE compression garment/ devices are fitted, Pt will transition to self-management CDT on R and we'll commence Intensive CDT on the L. Cont as per POC.  OBJECTIVE IMPAIRMENTS: low vision, Abnormal gait, decreased activity tolerance, decreased balance, decreased endurance, decreased knowledge of condition, decreased knowledge of use  of DME, decreased mobility, decreased ROM, increased edema, impaired flexibility, postural dysfunction, obesity, and pain.   ACTIVITY LIMITATIONS: functional mobility and ambulation (carrying, lifting, bending, sitting, standing, squatting, sleeping, stairs, transfers, bed mobility, continence, Basic and instrumental ADLs (bathing, dressing, hygiene/grooming, Productive activities ( caring for others) and leisure pursuits, body image, and social participation  PERSONAL FACTORS:  multiple co morbidities, length of time with progressive condition, are also affecting patient's functional outcome.   REHAB POTENTIAL: Fair    EVALUATION COMPLEXITY: Moderate   GOALS: Goals reviewed with patient? Yes  SHORT TERM GOALS: Target date: 4th OT Rx visit   Pt will demonstrate understanding of lymphedema precautions and prevention strategies with modified independence using a printed reference to identify at least 5 precautions and discussing how s/he may implement them into daily life to reduce risk of progression with extra time (Modified Independence). Baseline:Max A Goal status: 12/07/22 GOAL MET  2.  Pt will be able to apply multilayer, thigh length, gradient, compression wraps to one leg at a time with modified assistance (extra time) to decrease limb volume, to limit infection risk, and to limit lymphedema progression.  Baseline: Dependent Goal status: 11/16/22 PROGRESSING 11/26/22 GOAL MET  LONG TERM GOALS: Target date: 02/01/23  Given this patient's Intake score of 63/100% on the functional outcomes FOTO tool, patient will experience an increase in function of 3 points to improve basic and instrumental ADLs performance, including lymphedema self-care.  Baseline: ONGOING   Goal status:12/07/22 GOAL ONGOING  2.  Given this patient's Intake score of 50.0 % on the Lymphedema Life Impact Scale (LLIS), patient will experience a reduction of at least 5 points in her perceived level of functional impairment  resulting from lymphedema to improve functional performance and quality of life (QOL). Baseline: 50% Goal status: 12/07/22 GOAL ONGOING  3.  During Intensive phase CDT Pt will achieve at least 85% compliance with all lymphedema self-care home program components, including daily skin care, compression wraps and /or garments, simple self MLD and lymphatic pumping therex to habituate LE self care protocol  into ADLs for optimal LE self-management over time. Baseline: Dependent Goal status: 12/08/22 ONGOING. Pt not changing wraps daily as directed, but will work on increasing elevation anytime she is seated for next progress report. Pt will also increase frequency of re-wrapping her leg to ensure optimal volume reduction.   4.  Pt will achieve at least a 10% volume reduction below the knees to return limb to typical size and shape, to limit infection risk and LE progression, to decrease pain, to improve function. Baseline: dependent Goal status: 12/07/22 PROGRESSING  5.  Pt will obtain proper compression garments/compression braces and be modified independent with donning/doffing to optimize/stabilize with fluid reduction. Baseline:  Goal status: 12/07/22 PROGRESSING  PLAN:  PT FREQUENCY: 2x/week  PT DURATION: 12 weeks  PLANNED INTERVENTIONS: Therapeutic exercises, Therapeutic activity, Patient/Family education, Self Care, Manual lymph drainage, Compression bandaging, scar mobilization, Manual therapy, and skin care throughout MLD  PLAN FOR NEXT SESSION:  Knee length compression wraps to RLE below the knee Cont Pt edu for LE self care Continue MLD to RLE  Loel Dubonnet, MS, OTR/L, CLT-LANA 01/06/23 11:41 AM`

## 2023-01-08 LAB — HLA-B27 ANTIGEN: HLA-B27: NEGATIVE

## 2023-01-11 ENCOUNTER — Ambulatory Visit: Payer: Medicare Other | Admitting: Occupational Therapy

## 2023-01-11 DIAGNOSIS — E1122 Type 2 diabetes mellitus with diabetic chronic kidney disease: Secondary | ICD-10-CM | POA: Diagnosis not present

## 2023-01-11 DIAGNOSIS — R809 Proteinuria, unspecified: Secondary | ICD-10-CM | POA: Diagnosis not present

## 2023-01-11 DIAGNOSIS — E1129 Type 2 diabetes mellitus with other diabetic kidney complication: Secondary | ICD-10-CM | POA: Diagnosis not present

## 2023-01-11 DIAGNOSIS — N1832 Chronic kidney disease, stage 3b: Secondary | ICD-10-CM | POA: Diagnosis not present

## 2023-01-11 DIAGNOSIS — E0500 Thyrotoxicosis with diffuse goiter without thyrotoxic crisis or storm: Secondary | ICD-10-CM | POA: Diagnosis not present

## 2023-01-12 DIAGNOSIS — H30023 Focal chorioretinal inflammation of posterior pole, bilateral: Secondary | ICD-10-CM | POA: Diagnosis not present

## 2023-01-13 ENCOUNTER — Ambulatory Visit: Payer: Medicare Other | Admitting: Occupational Therapy

## 2023-01-13 ENCOUNTER — Encounter: Payer: Self-pay | Admitting: Occupational Therapy

## 2023-01-13 DIAGNOSIS — I890 Lymphedema, not elsewhere classified: Secondary | ICD-10-CM | POA: Diagnosis not present

## 2023-01-13 DIAGNOSIS — H209 Unspecified iridocyclitis: Secondary | ICD-10-CM | POA: Diagnosis not present

## 2023-01-13 NOTE — Therapy (Signed)
OUTPATIENT OCCUPATIONAL THERAPY TREATMENT NOTE   LOWER EXTREMITY LYMPHEDEMA Patient Name: Emily Wood MRN: [id number] DOB:Jul 25, 1950, 72 y.o., female Today's Date: 01/13/2023  END OF SESSION:   OT End of Session - 01/13/23 0815     Visit Number 18    Number of Visits 36    Date for OT Re-Evaluation 02/01/23    OT Start Time 0803    OT Stop Time 0905    OT Time Calculation (min) 62 min    Activity Tolerance Patient tolerated treatment well;No increased pain   Eval limited by Pt arriving late   Behavior During Therapy Emily Wood for tasks assessed/performed               Past Medical History:  Diagnosis Date   Arthritis    Diabetes mellitus without complication (HCC)    Family history of adverse reaction to anesthesia    PONV   Graves disease 04/14/2020   Hypertension    Lymphedema    Mixed hyperlipidemia 02/20/2021   Past Surgical History:  Procedure Laterality Date   CATARACT EXTRACTION W/PHACO Right 04/14/2022   Procedure: CATARACT EXTRACTION PHACO AND INTRAOCULAR LENS PLACEMENT (IOC) RIGHT DIABETIC 10.93 00:53.0;  Surgeon: Galen Manila, MD;  Location: [facility];  Service: Ophthalmology;  Laterality: Right;   CATARACT EXTRACTION W/PHACO Left 04/28/2022   Procedure: CATARACT EXTRACTION PHACO AND INTRAOCULAR LENS PLACEMENT (IOC) LEFT DIABETIC 6.05 00:45.7;  Surgeon: Galen Manila, MD;  Location: [facility];  Service: Ophthalmology;  Laterality: Left;  Diabetic   CHOLECYSTECTOMY     HERNIA REPAIR     OTHER SURGICAL HISTORY     scar tissue removal from bowels   PARTIAL HYSTERECTOMY     Still has ovaries   REVERSE SHOULDER ARTHROPLASTY Right 09/11/2022   Procedure: REVERSE SHOULDER ARTHROPLASTY;  Surgeon: Yolonda Kida, MD;  Location: [facility];  Service: Orthopedics;  Laterality: Right;  120   UTERINE FIBROID SURGERY     Patient Active Problem List   Diagnosis Date Noted   Normocytic anemia 05/23/2022   AKI (acute kidney injury)  (HCC) 05/21/2022   Snoring 11/05/2021   Daytime somnolence 11/05/2021   Coronary artery disease involving native coronary artery of native heart without angina pectoris 05/15/2021   Nonrheumatic tricuspid valve regurgitation 05/15/2021   Pulmonary hypertension, unspecified (HCC) 05/15/2021   Mixed hyperlipidemia 02/20/2021   Obesity (BMI 30-39.9) 02/20/2021   Diabetic retinopathy (HCC) 08/28/2020   Mixed incontinence 05/31/2020   Graves disease 04/14/2020   Right renal mass 11/22/2019   Arthritis of left knee 09/25/2019   Lymphedema 01/25/2017   Fibroid uterus 01/14/2017   Chronic kidney disease, stage 3 (HCC) 09/21/2016   Meralgia paresthetica of left side 08/27/2015   Diabetes mellitus type 2, controlled (HCC) 08/02/2015   Chronic venous insufficiency 06/28/2015   Essential hypertension 02/15/2015   Peripheral edema 02/15/2015   Chronic knee pain 02/15/2015    PCP: Pearline Cables, MD  REFERRING PROVIDER: Sheppard Plumber, NP  REFERRING DIAG: I89.0  THERAPY DIAG:  Lymphedema, not elsewhere classified  Rationale for Evaluation and Treatment: Rehabilitation  ONSET DATE: insidious onset >30 years  SUBJECTIVE:  SUBJECTIVE STATEMENT: Emily Wood presents to Occupational Therapy for treatment of BLE lymphedema. She arrives with knee length, multilayer compression wraps in place on RLE. She is unaccompanied and walks to clinic. Pt denies LE-related leg pain this morning. She reports 4/10 R OA knee pain is unchanged. Pt has no new complaints.  PERTINENT HISTORY:  Medical Hx contributing to, or affected by chronic, progressive lymphedema: Norvasc prescription-common side effect is leg swelling; HTN, CVI, Obesity, CAD, OA knees, CKD stage 3, Graves Disease, DM  PAIN:  Are you having pain? Yes: B  arthritis knees pain;  4/10 Pain description: stiff, aching, throbbing, tight, heavy Aggravating factors: standing , walking,  Relieving factors: "The medication from my cardiologist", elevation, compression stockings ( Pt has good hip AROM bilaterally and is able to don them herself w extra time in clinic.)  PRECAUTIONS: Fall and Other: LYMPHEDEMA PRECAUTIONS: (CKD, CAD and DM, GRAVES )   WEIGHT BEARING RESTRICTIONS: No  FALLS:  Has patient fallen since last visit? NO    HAND DOMINANCE: right   PRIOR LEVEL OF FUNCTION: Requires assistive device for independence, Needs assistance with homemaking, and Needs assistance with gait  PATIENT GOALS: ".... to get this lymphedema under control. It's embarrassing. That's why I wear long skirts."   OBJECTIVE:   Mild, Stage  II, Bilateral Lower Extremity Lymphedema 2/2 CVI and Obesity  OBSERVATIONS / OTHER ASSESSMENTS:   Lymphedema Life Impact Scape (LLIS): Intake 11/03/22: 50% (The extent to which L:E-related problems affected your life during the past week)  FOTO functional outcome scale: Intake 11/16/22: 63%  BLE COMPARATIVE LIMB VOLUMETRICS:   Intake measurements completed 10/22/22  LANDMARK RIGHT   R LEG (A-D) 5043.4 ml  R THIGH (E-G) 4182.3 ml  R FULL LIMB (A-G) 7643.8 ml  Limb Volume differential (LVD)   Leg LVD =12.14%, L>R Thigh LVD 8.3%, L>R Full limb LVD= 8.3%   Volume change since initial %  Volume change overall V  (Blank rows = not tested)  LANDMARK LEFT    R LEG (A-D) 5656.0 ml  R THIGH (E-G) 4529.5 ml  R FULL LIMB (A-G) 10185.5 ml  Limb Volume differential (LVD)  %  Volume change since initial %  Volume change overall %  (Blank rows = not tested)   9th Visit: 12/07/22  [facility] RIGHT   R LEG (A-D) 5116.3 ml  R THIGH (E-G) 5047.6  ml  R FULL LIMB (A-G) 10163.9  ml  Limb Volume differential (LVD)    Volume change since initial R LEG volume INCREASED by 1.4%;  R THIGH INCREASED by 20.7%, and R FULL LIMB  INCREASED by 10.17% since initial on 10/22/22.  Volume change overall V  (Blank rows = not tested)   TODAY'S TREATMENT:  MLD to RLE as established with simultaneous skin care RLE Multilayer, knee length, compression wraps   Pt edu for LE self-care                      PATIENT EDUCATION:  Pt edu for LE self care:  Continued Pt/ CG edu for lymphedema self care home program throughout session. Emphasis today on lymphedema precautions and cellulitis ID, prevention and Rx. Discussed again fluid dynamics re elevation, and importance of elevation for lymphedema management and venous return. Other topics include outcome of comparative limb volumetrics- starting limb volume differentials (LVDs), technology and gradient techniques used for short stretch, multilayer compression wrapping, simple self-MLD, therapeutic lymphatic pumping exercises, skin/nail care, LE precautions,. compression garment recommendations and  specifications, wear and care schedule and compression garment donning / doffing w assistive devices. Discussed progress towards all OT goals since commencing CDT. All questions answered to the Pt's satisfaction. Good return. Person educated: Patient Education method: Explanation, Demonstration, Tactile cues, Verbal cues, and Handouts Education comprehension: verbalized understanding, returned demonstration, and needs further education  HOME EXERCISE PROGRAM: Intensive Phase Complete Decongestive Therapy (CDT) includes Daily Manual lymphatic drainage: Short neck sequence bilaterally w diaphragmatic breathing to engage deep abdominal lymphatics. Stationary J strokes to inguinal LN, then proximal to distal dynamic J strokes to thigh, "bottleneck" region, leg and foot. Finally 3 retrograde sweeps back to terminus. Daily Skin care w/ low ph lotion matching skin ph to limit infection risk and improve tissue flexibility and excursion Daily, BLE lymphatic pumping therex- seated or supine Daily (23/7)  multilayer compression bandages below the knee to reduce limb volume, one limb at a time (One each 8 cm, 10 cm and 12 cm wide x 5 meters long short stretch compression wrap applied circumferentially using gradient layering techniques over single layer of cotton stockinett an Rosidal foam- toes to popliteal fossa  Self-management Phase CDT includes: All of the above and Appropriate daytime compression garments and HOS devices full time daily. Replace q 3-6 months Elvarex, flat knit, ccl 225-32 mmHg) knee highs w open toe, t heel, oblique top edge, 2.5 cm wide SB on top, tricot patch at anterior ankle sewn on all 4 sides. To be work full time waking hours. [employer]st RELAX knee high HOS device  Custom-made gradient compression garments and HOS devices are medically necessary in this case because they are uniquely sized and shaped to fit the exact dimensions of the affected extremities with deformities, and to provide accurate and consistent gradient compression and containment, essential to optimally managing this patient's symptoms of chronic, progressive lymphedema. Multiple custom compression garments are needed for optimal hygiene to limit infection risk. Custom compression garments should be replaced q 3-6 months When worn consistently for optimal lipo-lymphedema self-management over time.   ASSESSMENT:  CLINICAL IMPRESSION: Continued Pt edu for self care throughout session.  Pt tolerated RLE/RLQ MLD, including deeper fibrosis techniques to distal leg, ankle and dorsal foot. Multilayer compression wraps applied below the knee on R. Pt denies increased pain with Rx. Once custom RLE compression garment/ devices are fitted, Pt will transition to self-management CDT on R and we'll commence Intensive CDT on the L. Cont as per POC.  OBJECTIVE IMPAIRMENTS: low vision, Abnormal gait, decreased activity tolerance, decreased balance, decreased endurance, decreased knowledge of condition, decreased knowledge of use  of DME, decreased mobility, decreased ROM, increased edema, impaired flexibility, postural dysfunction, obesity, and pain.   ACTIVITY LIMITATIONS: functional mobility and ambulation (carrying, lifting, bending, sitting, standing, squatting, sleeping, stairs, transfers, bed mobility, continence, Basic and instrumental ADLs (bathing, dressing, hygiene/grooming, Productive activities ( caring for others) and leisure pursuits, body image, and social participation  PERSONAL FACTORS:  multiple co morbidities, length of time with progressive condition, are also affecting patient's functional outcome.   REHAB POTENTIAL: Fair    EVALUATION COMPLEXITY: Moderate   GOALS: Goals reviewed with patient? Yes  SHORT TERM GOALS: Target date: 4th OT Rx visit   Pt will demonstrate understanding of lymphedema precautions and prevention strategies with modified independence using a printed reference to identify at least 5 precautions and discussing how s/he may implement them into daily life to reduce risk of progression with extra time (Modified Independence). Baseline:Max A Goal status: 12/07/22 GOAL MET  2.  Pt will be able to apply multilayer, thigh length, gradient, compression wraps to one leg at a time with modified assistance (extra time) to decrease limb volume, to limit infection risk, and to limit lymphedema progression.  Baseline: Dependent Goal status: 11/16/22 PROGRESSING 11/26/22 GOAL MET  LONG TERM GOALS: Target date: 02/01/23  Given this patient's Intake score of 63/100% on the functional outcomes FOTO tool, patient will experience an increase in function of 3 points to improve basic and instrumental ADLs performance, including lymphedema self-care.  Baseline: ONGOING   Goal status:12/07/22 GOAL ONGOING  2.  Given this patient's Intake score of 50.0 % on the Lymphedema Life Impact Scale (LLIS), patient will experience a reduction of at least 5 points in her perceived level of functional impairment  resulting from lymphedema to improve functional performance and quality of life (QOL). Baseline: 50% Goal status: 12/07/22 GOAL ONGOING  3.  During Intensive phase CDT Pt will achieve at least 85% compliance with all lymphedema self-care home program components, including daily skin care, compression wraps and /or garments, simple self MLD and lymphatic pumping therex to habituate LE self care protocol  into ADLs for optimal LE self-management over time. Baseline: Dependent Goal status: 12/08/22 ONGOING. Pt not changing wraps daily as directed, but will work on increasing elevation anytime she is seated for next progress report. Pt will also increase frequency of re-wrapping her leg to ensure optimal volume reduction.   4.  Pt will achieve at least a 10% volume reduction below the knees to return limb to typical size and shape, to limit infection risk and LE progression, to decrease pain, to improve function. Baseline: dependent Goal status: 12/07/22 PROGRESSING  5.  Pt will obtain proper compression garments/compression braces and be modified independent with donning/doffing to optimize/stabilize with fluid reduction. Baseline:  Goal status: 12/07/22 PROGRESSING  PLAN:  PT FREQUENCY: 2x/week  PT DURATION: 12 weeks  PLANNED INTERVENTIONS: Therapeutic exercises, Therapeutic activity, Patient/Family education, Self Care, Manual lymph drainage, Compression bandaging, scar mobilization, Manual therapy, and skin care throughout MLD  PLAN FOR NEXT SESSION:  Knee length compression wraps to RLE below the knee Cont Pt edu for LE self care Continue MLD to RLE  Loel Dubonnet, MS, OTR/L, CLT-LANA 01/13/23 11:59 AM`

## 2023-01-16 ENCOUNTER — Other Ambulatory Visit: Payer: Self-pay | Admitting: Family Medicine

## 2023-01-16 DIAGNOSIS — M5432 Sciatica, left side: Secondary | ICD-10-CM

## 2023-01-18 ENCOUNTER — Ambulatory Visit: Payer: Medicare Other | Attending: Nurse Practitioner | Admitting: Occupational Therapy

## 2023-01-18 DIAGNOSIS — I890 Lymphedema, not elsewhere classified: Secondary | ICD-10-CM | POA: Insufficient documentation

## 2023-01-18 NOTE — Therapy (Signed)
OUTPATIENT OCCUPATIONAL THERAPY TREATMENT NOTE   LOWER EXTREMITY LYMPHEDEMA Patient Name: Emily Wood MRN: [id number] DOB:[birth year], 72 y.o., female Today's Date: 01/18/2023  END OF SESSION:   OT End of Session - 01/18/23 0823     Visit Number 19    Number of Visits 36    Date for OT Re-Evaluation 02/01/23    OT Start Time 0803    OT Stop Time 0920    OT Time Calculation (min) 77 min    Activity Tolerance Patient tolerated treatment well;No increased pain   Eval limited by Pt arriving late   Behavior During Therapy [facility] for tasks assessed/performed               Past Medical History:  Diagnosis Date   Arthritis    Diabetes mellitus without complication (HCC)    Family history of adverse reaction to anesthesia    PONV   Graves disease 04/14/2020   Hypertension    Lymphedema    Mixed hyperlipidemia 02/20/2021   Past Surgical History:  Procedure Laterality Date   CATARACT EXTRACTION W/PHACO Right 04/14/2022   Procedure: CATARACT EXTRACTION PHACO AND INTRAOCULAR LENS PLACEMENT (IOC) RIGHT DIABETIC 10.93 00:53.0;  Surgeon: Galen Manila, MD;  Location: [facility];  Service: Ophthalmology;  Laterality: Right;   CATARACT EXTRACTION W/PHACO Left 04/28/2022   Procedure: CATARACT EXTRACTION PHACO AND INTRAOCULAR LENS PLACEMENT (IOC) LEFT DIABETIC 6.05 00:45.7;  Surgeon: Galen Manila, MD;  Location: [facility];  Service: Ophthalmology;  Laterality: Left;  Diabetic   CHOLECYSTECTOMY     HERNIA REPAIR     OTHER SURGICAL HISTORY     scar tissue removal from bowels   PARTIAL HYSTERECTOMY     Still has ovaries   REVERSE SHOULDER ARTHROPLASTY Right 09/11/2022   Procedure: REVERSE SHOULDER ARTHROPLASTY;  Surgeon: Yolonda Kida, MD;  Location: [facility];  Service: Orthopedics;  Laterality: Right;  120   UTERINE FIBROID SURGERY     Patient Active Problem List   Diagnosis Date Noted   Normocytic anemia 05/23/2022   AKI (acute kidney injury)  (HCC) 05/21/2022   Snoring 11/05/2021   Daytime somnolence 11/05/2021   Coronary artery disease involving native coronary artery of native heart without angina pectoris 05/15/2021   Nonrheumatic tricuspid valve regurgitation 05/15/2021   Pulmonary hypertension, unspecified (HCC) 05/15/2021   Mixed hyperlipidemia 02/20/2021   Obesity (BMI 30-39.9) 02/20/2021   Diabetic retinopathy (HCC) 08/28/2020   Mixed incontinence 05/31/2020   Graves disease 04/14/2020   Right renal mass 11/22/2019   Arthritis of left knee 09/25/2019   Lymphedema 01/25/2017   Fibroid uterus 01/14/2017   Chronic kidney disease, stage 3 (HCC) 09/21/2016   Meralgia paresthetica of left side 08/27/2015   Diabetes mellitus type 2, controlled (HCC) 08/02/2015   Chronic venous insufficiency 06/28/2015   Essential hypertension 02/15/2015   Peripheral edema 02/15/2015   Chronic knee pain 02/15/2015    PCP: Pearline Cables, MD  REFERRING PROVIDER: Sheppard Plumber, NP  REFERRING DIAG: I89.0  THERAPY DIAG:  Lymphedema, not elsewhere classified  Rationale for Evaluation and Treatment: Rehabilitation  ONSET DATE: insidious onset >30 years  SUBJECTIVE:  SUBJECTIVE STATEMENT: Emily Wood presents to Occupational Therapy for treatment of BLE lymphedema. She arrives with knee length, multilayer compression wraps in place on RLE. She is unaccompanied and walks to clinic. Pt denies LE-related leg pain this morning. She reports 5/10 R OA knee pain. Pt has no new complaints.  PERTINENT HISTORY:  Medical Hx contributing to, or affected by chronic, progressive lymphedema: Norvasc prescription-common side effect is leg swelling; HTN, CVI, Obesity, CAD, OA knees, CKD stage 3, Graves Disease, DM  PAIN:  Are you having pain? Yes: R arthritis  knees pain;  5/10 Pain description: stiff, aching, throbbing, tight, heavy Aggravating factors: standing , walking,  Relieving factors: "The medication from my cardiologist", elevation, compression stockings ( Pt has good hip AROM bilaterally and is able to don them herself w extra time in clinic.)  PRECAUTIONS: Fall and Other: LYMPHEDEMA PRECAUTIONS: (CKD, CAD and DM, GRAVES )   WEIGHT BEARING RESTRICTIONS: No  FALLS:  Has patient fallen since last visit? NO    HAND DOMINANCE: right   PRIOR LEVEL OF FUNCTION: Requires assistive device for independence, Needs assistance with homemaking, and Needs assistance with gait  PATIENT GOALS: ".... to get this lymphedema under control. It's embarrassing. That's why I wear long skirts."   OBJECTIVE:   Mild, Stage  II, Bilateral Lower Extremity Lymphedema 2/2 CVI and Obesity  OBSERVATIONS / OTHER ASSESSMENTS:   Lymphedema Life Impact Scape (LLIS): Intake 11/03/22: 50% (The extent to which L:E-related problems affected your life during the past week)  FOTO functional outcome scale: Intake 11/16/22: 63%  BLE COMPARATIVE LIMB VOLUMETRICS:   Intake measurements completed 10/22/22  LANDMARK RIGHT   R LEG (A-D) 5043.4 ml  R THIGH (E-G) 4182.3 ml  R FULL LIMB (A-G) 7643.8 ml  Limb Volume differential (LVD)   Leg LVD =12.14%, L>R Thigh LVD 8.3%, L>R Full limb LVD= 8.3%   Volume change since initial %  Volume change overall V  (Blank rows = not tested)  LANDMARK LEFT    R LEG (A-D) 5656.0 ml  R THIGH (E-G) 4529.5 ml  R FULL LIMB (A-G) 10185.5 ml  Limb Volume differential (LVD)  %  Volume change since initial %  Volume change overall %  (Blank rows = not tested)   9th Visit: 12/07/22  [facility] RIGHT   R LEG (A-D) 5116.3 ml  R THIGH (E-G) 5047.6  ml  R FULL LIMB (A-G) 10163.9  ml  Limb Volume differential (LVD)    Volume change since initial R LEG volume INCREASED by 1.4%;  R THIGH INCREASED by 20.7%, and R FULL LIMB INCREASED by  10.17% since initial on 10/22/22.  Volume change overall V  (Blank rows = not tested)       19th visit 01/18/23  LANDMARK RIGHT   R LEG (A-D) 4606.5 ml  R THIGH (E-G) 4617.0 ml  R FULL LIMB (A-G) 9224 ml  Limb Volume differential (LVD)    Volume change since initial Since last measured on 9th Rx visit on 12/07/22, R LEG volume is DECREASED by 9.96 %, R THIGH volume is DECREASED by 8.5 %, and R FULL LIMB volume is DECREASED by 9.25 %   Volume change overall Since initially measured on 10/22/22 on 1st Rx visit,  R LEG volume is DECREASED by 8.67 % overall, R THIGH volume is INCREASED by 10 %, and R FULL LIMB volume is DECREASED by 0.018 %   (Blank rows = not tested)  TODAY'S TREATMENT:  RLE comparative limb volumetrics RLE  Multilayer, knee length, compression wraps   Pt edu for LE self-care and progress towards goals to date                  PATIENT EDUCATION:  Pt edu for LE self care:  Continued Pt/ CG edu for lymphedema self care home program throughout session. Emphasis today on lymphedema precautions and cellulitis ID, prevention and Rx. Discussed again fluid dynamics re elevation, and importance of elevation for lymphedema management and venous return. Other topics include outcome of comparative limb volumetrics- starting limb volume differentials (LVDs), technology and gradient techniques used for short stretch, multilayer compression wrapping, simple self-MLD, therapeutic lymphatic pumping exercises, skin/nail care, LE precautions,. compression garment recommendations and specifications, wear and care schedule and compression garment donning / doffing w assistive devices. Discussed progress towards all OT goals since commencing CDT. All questions answered to the Pt's satisfaction. Good return. Person educated: Patient Education method: Explanation, Demonstration, Tactile cues, Verbal cues, and Handouts Education comprehension: verbalized understanding, returned demonstration, and needs further  education  HOME EXERCISE PROGRAM: Intensive Phase Complete Decongestive Therapy (CDT) includes Daily Manual lymphatic drainage: Short neck sequence bilaterally w diaphragmatic breathing to engage deep abdominal lymphatics. Stationary J strokes to inguinal LN, then proximal to distal dynamic J strokes to thigh, "bottleneck" region, leg and foot. Finally 3 retrograde sweeps back to terminus. Daily Skin care w/ low ph lotion matching skin ph to limit infection risk and improve tissue flexibility and excursion Daily, BLE lymphatic pumping therex- seated or supine Daily (23/7) multilayer compression bandages below the knee to reduce limb volume, one limb at a time (One each 8 cm, 10 cm and 12 cm wide x 5 meters long short stretch compression wrap applied circumferentially using gradient layering techniques over single layer of cotton stockinett an Rosidal foam- toes to popliteal fossa  Self-management Phase CDT includes: All of the above and Appropriate daytime compression garments and HOS devices full time daily. Replace q 3-6 months Elvarex, flat knit, ccl 225-32 mmHg) knee highs w open toe, t heel, oblique top edge, 2.5 cm wide SB on top, tricot patch at anterior ankle sewn on all 4 sides. To be work full time waking hours. Jobst RELAX knee high HOS device  Custom-made gradient compression garments and HOS devices are medically necessary in this case because they are uniquely sized and shaped to fit the exact dimensions of the affected extremities with deformities, and to provide accurate and consistent gradient compression and containment, essential to optimally managing this patient's symptoms of chronic, progressive lymphedema. Multiple custom compression garments are needed for optimal hygiene to limit infection risk. Custom compression garments should be replaced q 3-6 months When worn consistently for optimal lipo-lymphedema self-management over time.   ASSESSMENT:  CLINICAL IMPRESSION: In prep  for upcoming progress report we completed RLE comaprative limb volumetrics, which reveal progress towards limb volume reduction goal for leg, thigh and full limb. Since last measured on 9th Rx visit on 12/07/22, R LEG volume is DECREASED by 9.96 %, R THIGH volume is DECREASED by 8.5 %, and R FULL LIMB volume is DECREASED by 9.25 % . Since initially measured on 10/22/22 on 1st Rx visit,  R LEG volume is DECREASED by 8.67 % overall, R THIGH volume is INCREASED by 10 %, and R FULL LIMB volume is DECREASED by 0.018 % . Volumetrics taken on the 9th visit revealed negligible increase in volume below the knee, but dramatic increase in the thigh, again, skewing results for the leg,  which is the segment we are primarily focused on treating. Reviewed results with Pt explaining that with the 8.67% reduction in the leg she is close to achieving her 10% reduction goal. Reapplied compression wraps to R leg as established. Complete full goal review next session. Ont as per POC.  Multilayer compression wraps applied below the knee on R. Pt denies increased pain with Rx. Once custom RLE compression garment/ devices are fitted, Pt will transition to self-management CDT on R and we'll commence Intensive CDT on the L. Cont as per POC.  OBJECTIVE IMPAIRMENTS: low vision, Abnormal gait, decreased activity tolerance, decreased balance, decreased endurance, decreased knowledge of condition, decreased knowledge of use of DME, decreased mobility, decreased ROM, increased edema, impaired flexibility, postural dysfunction, obesity, and pain.   ACTIVITY LIMITATIONS: functional mobility and ambulation (carrying, lifting, bending, sitting, standing, squatting, sleeping, stairs, transfers, bed mobility, continence, Basic and instrumental ADLs (bathing, dressing, hygiene/grooming, Productive activities ( caring for others) and leisure pursuits, body image, and social participation  PERSONAL FACTORS:  multiple co morbidities, length of time  with progressive condition, are also affecting patient's functional outcome.   REHAB POTENTIAL: Fair    EVALUATION COMPLEXITY: Moderate   GOALS: Goals reviewed with patient? Yes  SHORT TERM GOALS: Target date: 4th OT Rx visit   Pt will demonstrate understanding of lymphedema precautions and prevention strategies with modified independence using a printed reference to identify at least 5 precautions and discussing how s/he may implement them into daily life to reduce risk of progression with extra time (Modified Independence). Baseline:Max A Goal status: 12/07/22 GOAL MET  2.  Pt will be able to apply multilayer, thigh length, gradient, compression wraps to one leg at a time with modified assistance (extra time) to decrease limb volume, to limit infection risk, and to limit lymphedema progression.  Baseline: Dependent Goal status: 11/16/22 PROGRESSING 11/26/22 GOAL MET  LONG TERM GOALS: Target date: 02/01/23  Given this patient's Intake score of 63/100% on the functional outcomes FOTO tool, patient will experience an increase in function of 3 points to improve basic and instrumental ADLs performance, including lymphedema self-care.  Baseline: ONGOING   Goal status:12/07/22 GOAL ONGOING  2.  Given this patient's Intake score of 50.0 % on the Lymphedema Life Impact Scale (LLIS), patient will experience a reduction of at least 5 points in her perceived level of functional impairment resulting from lymphedema to improve functional performance and quality of life (QOL). Baseline: 50% Goal status: 12/07/22 GOAL ONGOING  3.  During Intensive phase CDT Pt will achieve at least 85% compliance with all lymphedema self-care home program components, including daily skin care, compression wraps and /or garments, simple self MLD and lymphatic pumping therex to habituate LE self care protocol  into ADLs for optimal LE self-management over time. Baseline: Dependent Goal status: 12/08/22 ONGOING. Pt not  changing wraps daily as directed, but will work on increasing elevation anytime she is seated for next progress report. Pt will also increase frequency of re-wrapping her leg to ensure optimal volume reduction.   4.  Pt will achieve at least a 10% volume reduction below the knees to return limb to typical size and shape, to limit infection risk and LE progression, to decrease pain, to improve function. Baseline: dependent Goal status: 12/07/22 PROGRESSING 01/18/23 : Leg reduction= 8.67% since initial. Thigh  is INCreased 10% since initial, And full LE is decreased by 0.018% since 11/11/22  5.  Pt will obtain proper compression garments/compression braces and be modified independent  with donning/doffing to optimize/stabilize with fluid reduction. Baseline:  Goal status: 12/07/22 PROGRESSING  PLAN:  PT FREQUENCY: 2x/week  PT DURATION: 12 weeks  PLANNED INTERVENTIONS: Therapeutic exercises, Therapeutic activity, Patient/Family education, Self Care, Manual lymph drainage, Compression bandaging, scar mobilization, Manual therapy, and skin care throughout MLD  PLAN FOR NEXT SESSION:  Knee length compression wraps to RLE below the knee Cont Pt edu for LE self care Continue MLD to RLE  Loel Dubonnet, MS, OTR/L, CLT-LANA 01/18/23 9:41 AM`

## 2023-01-19 DIAGNOSIS — M25511 Pain in right shoulder: Secondary | ICD-10-CM | POA: Diagnosis not present

## 2023-01-20 ENCOUNTER — Ambulatory Visit: Payer: Medicare Other | Admitting: Occupational Therapy

## 2023-01-20 ENCOUNTER — Encounter: Payer: Self-pay | Admitting: Occupational Therapy

## 2023-01-20 DIAGNOSIS — I890 Lymphedema, not elsewhere classified: Secondary | ICD-10-CM

## 2023-01-20 NOTE — Addendum Note (Signed)
Addended by: Judithann Sauger on: 01/20/2023 10:08 AM   Modules accepted: Orders

## 2023-01-20 NOTE — Addendum Note (Signed)
Addended by: Judithann Sauger on: 01/20/2023 10:06 AM   Modules accepted: Orders

## 2023-01-20 NOTE — Therapy (Signed)
OUTPATIENT OCCUPATIONAL THERAPY TREATMENT NOTE AND PROGRESS REPORT  LOWER EXTREMITY LYMPHEDEMA Patient Name: Emily Wood MRN: [id number] DOB:March 11, 1951, 72 y.o., female Today's Date: 01/20/2023  REPORTING PERIOD: 12/16/22 - 01/20/23  END OF SESSION:   OT End of Session - 01/20/23 0804     Visit Number 20    Number of Visits 36    Date for OT Re-Evaluation 02/01/23    OT Start Time 0804    OT Stop Time 0905    OT Time Calculation (min) 61 min    Activity Tolerance Patient tolerated treatment well;No increased pain   Eval limited by Pt arriving late   Behavior During Therapy [facility] for tasks assessed/performed               Past Medical History:  Diagnosis Date   Arthritis    Diabetes mellitus without complication (HCC)    Family history of adverse reaction to anesthesia    PONV   Graves disease 04/14/2020   Hypertension    Lymphedema    Mixed hyperlipidemia 02/20/2021   Past Surgical History:  Procedure Laterality Date   CATARACT EXTRACTION W/PHACO Right 04/14/2022   Procedure: CATARACT EXTRACTION PHACO AND INTRAOCULAR LENS PLACEMENT (IOC) RIGHT DIABETIC 10.93 00:53.0;  Surgeon: Galen Manila, MD;  Location: [facility];  Service: Ophthalmology;  Laterality: Right;   CATARACT EXTRACTION W/PHACO Left 04/28/2022   Procedure: CATARACT EXTRACTION PHACO AND INTRAOCULAR LENS PLACEMENT (IOC) LEFT DIABETIC 6.05 00:45.7;  Surgeon: Galen Manila, MD;  Location: [facility];  Service: Ophthalmology;  Laterality: Left;  Diabetic   CHOLECYSTECTOMY     HERNIA REPAIR     OTHER SURGICAL HISTORY     scar tissue removal from bowels   PARTIAL HYSTERECTOMY     Still has ovaries   REVERSE SHOULDER ARTHROPLASTY Right 09/11/2022   Procedure: REVERSE SHOULDER ARTHROPLASTY;  Surgeon: Yolonda Kida, MD;  Location: [facility];  Service: Orthopedics;  Laterality: Right;  120   UTERINE FIBROID SURGERY     Patient Active Problem List   Diagnosis Date Noted    Normocytic anemia 05/23/2022   AKI (acute kidney injury) (HCC) 05/21/2022   Snoring 11/05/2021   Daytime somnolence 11/05/2021   Coronary artery disease involving native coronary artery of native heart without angina pectoris 05/15/2021   Nonrheumatic tricuspid valve regurgitation 05/15/2021   Pulmonary hypertension, unspecified (HCC) 05/15/2021   Mixed hyperlipidemia 02/20/2021   Obesity (BMI 30-39.9) 02/20/2021   Diabetic retinopathy (HCC) 08/28/2020   Mixed incontinence 05/31/2020   Graves disease 04/14/2020   Right renal mass 11/22/2019   Arthritis of left knee 09/25/2019   Lymphedema 01/25/2017   Fibroid uterus 01/14/2017   Chronic kidney disease, stage 3 (HCC) 09/21/2016   Meralgia paresthetica of left side 08/27/2015   Diabetes mellitus type 2, controlled (HCC) 08/02/2015   Chronic venous insufficiency 06/28/2015   Essential hypertension 02/15/2015   Peripheral edema 02/15/2015   Chronic knee pain 02/15/2015    PCP: Pearline Cables, MD  REFERRING PROVIDER: Sheppard Plumber, NP  REFERRING DIAG: I89.0  THERAPY DIAG:  Lymphedema, not elsewhere classified  Rationale for Evaluation and Treatment: Rehabilitation  ONSET DATE: insidious onset >30 years  SUBJECTIVE:  SUBJECTIVE STATEMENT: Emily Wood presents to Occupational Therapy for treatment of BLE lymphedema. She arrives with knee length, multilayer compression wraps in place on RLE. She is unaccompanied and walks to clinic. Pt denies LE-related leg pain this morning. She reports OA related R knee pain at  6/10 R. "Look! I was able to wear smaller shoes today."  PERTINENT HISTORY:  Medical Hx contributing to, or affected by chronic, progressive lymphedema: Norvasc prescription-common side effect is leg swelling; HTN, CVI, Obesity, CAD,  OA knees, CKD stage 3, Graves Disease, DM  PAIN:  Are you having pain? Yes: R arthritis knee pain;  6/10 Pain description: stiff, aching, throbbing, tight, heavy Aggravating factors: standing , walking,  Relieving factors: "The medication from my cardiologist", elevation, compression stockings ( Pt has good hip AROM bilaterally and is able to don them herself w extra time in clinic.)  PRECAUTIONS: Fall and Other: LYMPHEDEMA PRECAUTIONS: (CKD, CAD and DM, GRAVES )   WEIGHT BEARING RESTRICTIONS: No  FALLS:  Has patient fallen since last visit? NO    HAND DOMINANCE: right   PRIOR LEVEL OF FUNCTION: Requires assistive device for independence, Needs assistance with homemaking, and Needs assistance with gait  PATIENT GOALS: ".... to get this lymphedema under control. It's embarrassing. That's why I wear long skirts."   OBJECTIVE:   Mild, Stage  II, Bilateral Lower Extremity Lymphedema 2/2 CVI and Obesity  OBSERVATIONS / OTHER ASSESSMENTS:   Lymphedema Life Impact Scape (LLIS): Intake 11/03/22: 50% (The extent to which L:E-related problems affected your life during the past week)  FOTO functional outcome scale: Intake 11/16/22: 63%  BLE COMPARATIVE LIMB VOLUMETRICS:   Intake measurements completed 10/22/22  LANDMARK RIGHT   R LEG (A-D) 5043.4 ml  R THIGH (E-G) 4182.3 ml  R FULL LIMB (A-G) 7643.8 ml  Limb Volume differential (LVD)   Leg LVD =12.14%, L>R Thigh LVD 8.3%, L>R Full limb LVD= 8.3%   Volume change since initial %  Volume change overall V  (Blank rows = not tested)  LANDMARK LEFT    R LEG (A-D) 5656.0 ml  R THIGH (E-G) 4529.5 ml  R FULL LIMB (A-G) 10185.5 ml  Limb Volume differential (LVD)  %  Volume change since initial %  Volume change overall %  (Blank rows = not tested)   9th Visit: 12/07/22  [facility] RIGHT   R LEG (A-D) 5116.3 ml  R THIGH (E-G) 5047.6  ml  R FULL LIMB (A-G) 10163.9  ml  Limb Volume differential (LVD)    Volume change since initial R  LEG volume INCREASED by 1.4%;  R THIGH INCREASED by 20.7%, and R FULL LIMB INCREASED by 10.17% since initial on 10/22/22.  Volume change overall V  (Blank rows = not tested)       19th visit 01/18/23  LANDMARK RIGHT   R LEG (A-D) 4606.5 ml  R THIGH (E-G) 4617.0 ml  R FULL LIMB (A-G) 9224 ml  Limb Volume differential (LVD)    Volume change since initial Since last measured on 9th Rx visit on 12/07/22, R LEG volume is DECREASED by 9.96 %, R THIGH volume is DECREASED by 8.5 %, and R FULL LIMB volume is DECREASED by 9.25 %   Volume change overall Since initially measured on 10/22/22 on 1st Rx visit,  R LEG volume is DECREASED by 8.67 % overall, R THIGH volume is INCREASED by 10 %, and R FULL LIMB volume is DECREASED by 0.018 %   (Blank rows = not tested)  TODAY'S TREATMENT:  Progress review RLE Multilayer, knee length, compression wraps   Pt edu for LE self-care and progress towards goals to date                  PATIENT EDUCATION:  Pt edu for LE self care:  Continued Pt/ CG edu for lymphedema self care home program throughout session. Emphasis today on lymphedema precautions and cellulitis ID, prevention and Rx. Discussed again fluid dynamics re elevation, and importance of elevation for lymphedema management and venous return. Other topics include outcome of comparative limb volumetrics- starting limb volume differentials (LVDs), technology and gradient techniques used for short stretch, multilayer compression wrapping, simple self-MLD, therapeutic lymphatic pumping exercises, skin/nail care, LE precautions,. compression garment recommendations and specifications, wear and care schedule and compression garment donning / doffing w assistive devices. Discussed progress towards all OT goals since commencing CDT. All questions answered to the Pt's satisfaction. Good return. Person educated: Patient Education method: Explanation, Demonstration, Tactile cues, Verbal cues, and Handouts Education  comprehension: verbalized understanding, returned demonstration, and needs further education  HOME EXERCISE PROGRAM: Intensive Phase Complete Decongestive Therapy (CDT) includes Daily Manual lymphatic drainage: Short neck sequence bilaterally w diaphragmatic breathing to engage deep abdominal lymphatics. Stationary J strokes to inguinal LN, then proximal to distal dynamic J strokes to thigh, "bottleneck" region, leg and foot. Finally 3 retrograde sweeps back to terminus. Daily Skin care w/ low ph lotion matching skin ph to limit infection risk and improve tissue flexibility and excursion Daily, BLE lymphatic pumping therex- seated or supine Daily (23/7) multilayer compression bandages below the knee to reduce limb volume, one limb at a time (One each 8 cm, 10 cm and 12 cm wide x 5 meters long short stretch compression wrap applied circumferentially using gradient layering techniques over single layer of cotton stockinett an Rosidal foam- toes to popliteal fossa  Self-management Phase CDT includes: All of the above and Appropriate daytime compression garments and HOS devices full time daily. Replace q 3-6 months Elvarex, flat knit, ccl 225-32 mmHg) knee highs w open toe, t heel, oblique top edge, 2.5 cm wide SB on top, tricot patch at anterior ankle sewn on all 4 sides. To be work full time waking hours. [employer] RELAX knee high HOS device  Custom-made gradient compression garments and HOS devices are medically necessary in this case because they are uniquely sized and shaped to fit the exact dimensions of the affected extremities with deformities, and to provide accurate and consistent gradient compression and containment, essential to optimally managing this patient's symptoms of chronic, progressive lymphedema. Multiple custom compression garments are needed for optimal hygiene to limit infection risk. Custom compression garments should be replaced q 3-6 months When worn consistently for optimal  lipo-lymphedema self-management over time.   ASSESSMENT:  CLINICAL IMPRESSION: Reviewed progress towards all OT goals for LE care to date w/ Pt RLE. Pt demonstrates excellent progress. She remains diligent with all self-care   between visits and is fully engaged in all aspects of her rehab program. Comparative limb volumetrics, which reveal progress towards limb volume reduction goal for leg, thigh and full limb. Since last measured on 9th Rx visit on 12/07/22, R LEG volume is DECREASED by 9.96 %, R THIGH volume is DECREASED by 8.5 %, and R FULL LIMB volume is DECREASED by 9.25 % . Since initially measured on 10/22/22 on 1st Rx visit,  R LEG volume is DECREASED by 8.67 % overall, R THIGH volume is INCREASED by 10 %, and  R FULL LIMB volume is DECREASED by 0.018 % . Volumetrics taken on the 9th visit revealed negligible increase in volume below the knee, but dramatic increase in the thigh, again, skewing results for the leg, which is the segment we are primarily focused on treating. Reviewed results with Pt explaining that with the 8.67% reduction in the leg she has essentially  achieved 10% reduction goal. Please review GOALs section for details on remaining goals. Reapplied compression wraps to R leg as established. Fit custom garment and device to RLE as soon as they arrive from DME vendor , and commence CDT to LLE once this is complete.Vickey Huger as per POC.  Multilayer compression wraps applied below the knee on R. Pt denies increased pain with Rx. Once custom RLE compression garment/ devices are fitted, Pt will transition to self-management CDT on R and we'll commence Intensive CDT on the L. Cont as per POC.  OBJECTIVE IMPAIRMENTS: low vision, Abnormal gait, decreased activity tolerance, decreased balance, decreased endurance, decreased knowledge of condition, decreased knowledge of use of DME, decreased mobility, decreased ROM, increased edema, impaired flexibility, postural dysfunction, obesity, and pain.    ACTIVITY LIMITATIONS: functional mobility and ambulation (carrying, lifting, bending, sitting, standing, squatting, sleeping, stairs, transfers, bed mobility, continence, Basic and instrumental ADLs (bathing, dressing, hygiene/grooming, Productive activities ( caring for others) and leisure pursuits, body image, and social participation  PERSONAL FACTORS:  multiple co morbidities, length of time with progressive condition, are also affecting patient's functional outcome.   REHAB POTENTIAL: Fair    EVALUATION COMPLEXITY: Moderate   GOALS: Goals reviewed with patient? Yes  SHORT TERM GOALS: Target date: 4th OT Rx visit   Pt will demonstrate understanding of lymphedema precautions and prevention strategies with modified independence using a printed reference to identify at least 5 precautions and discussing how s/he may implement them into daily life to reduce risk of progression with extra time (Modified Independence). Baseline:Max A Goal status: 12/07/22 GOAL MET  2.  Pt will be able to apply multilayer, thigh length, gradient, compression wraps to one leg at a time with modified assistance (extra time) to decrease limb volume, to limit infection risk, and to limit lymphedema progression.  Baseline: Dependent Goal status: 11/16/22 PROGRESSING 11/26/22 GOAL MET  LONG TERM GOALS: Target date: 02/01/23  Given this patient's Intake score of 63/100% on the functional outcomes FOTO tool, patient will experience an increase in function of 3 points to improve basic and instrumental ADLs performance, including lymphedema self-care.  Baseline: ONGOING   Goal status:12/07/22 GOAL ONGOING,  01/20/23 PROGRESSING  2.  Given this patient's Intake score of 50.0 % on the Lymphedema Life Impact Scale (LLIS), patient will experience a reduction of at least 5 points in her perceived level of functional impairment resulting from lymphedema to improve functional performance and quality of life (QOL). Baseline:  50% Goal status: 12/07/22 GOAL ONGOING; 01/20/23 PROGRESSING  3.  During Intensive phase CDT Pt will achieve at least 85% compliance with all lymphedema self-care home program components, including daily skin care, compression wraps and /or garments, simple self MLD and lymphatic pumping therex, and elevation when sitting to habituate LE self care protocol  into ADLs for optimal LE self-management over time. Baseline: Dependent Goal status: 12/08/22 ONGOING. Pt not changing wraps daily as directed, but will work on increasing elevation anytime she is seated for next progress report. Pt will also increase frequency of re-wrapping her leg to ensure optimal volume reduction. GOAL MET 01/20/23  4.  Pt will  achieve at least a 10% volume reduction below the knees to return limb to typical size and shape, to limit infection risk and LE progression, to decrease pain, to improve function. Baseline: dependent Goal status: 12/07/22 PROGRESSING 01/18/23 : Leg reduction= 8.67% since initial. Thigh  is Increased 10% since initial, And full LE is decreased by 0.018% since 11/11/22;  01/20/23 GOAL MET for L LEG w 9.96 % reduction . See volumetric chart for additional details.  5.  Pt will obtain proper compression garments/compression braces and be modified independent with donning/doffing to optimize/stabilize with fluid reduction. Baseline:  Goal status: 12/07/22 PROGRESSING. 01/20/23 PROGRESSING.  PLAN:  PT FREQUENCY: 2x/week  PT DURATION: 12 weeks  PLANNED INTERVENTIONS: Therapeutic exercises, Therapeutic activity, Patient/Family education, Self Care, Manual lymph drainage, Compression bandaging, scar mobilization, Manual therapy, and skin care throughout MLD  PLAN FOR NEXT SESSION:  Knee length compression wraps to RLE below the knee Cont Pt edu for LE self care Continue MLD to RLE  Loel Dubonnet, MS, OTR/L, CLT-LANA 01/20/23 9:56 AM`

## 2023-01-27 ENCOUNTER — Encounter: Payer: Self-pay | Admitting: Occupational Therapy

## 2023-01-27 ENCOUNTER — Ambulatory Visit: Payer: Medicare Other | Admitting: Occupational Therapy

## 2023-01-27 DIAGNOSIS — I890 Lymphedema, not elsewhere classified: Secondary | ICD-10-CM

## 2023-01-27 NOTE — Therapy (Signed)
OUTPATIENT OCCUPATIONAL THERAPY TREATMENT NOTE   LOWER EXTREMITY LYMPHEDEMA Patient Name: Emily Wood MRN: [id number] DOB:1950-12-18, 72 y.o., female Today's Date: 01/27/2023  END OF SESSION:   OT End of Session - 01/27/23 0811     Visit Number 21    Number of Visits 36    Date for OT Re-Evaluation 04/20/23    OT Start Time 0810    Activity Tolerance Patient tolerated treatment well;No increased pain   Eval limited by Pt arriving late   Behavior During Therapy [facility] for tasks assessed/performed               Past Medical History:  Diagnosis Date   Arthritis    Diabetes mellitus without complication (HCC)    Family history of adverse reaction to anesthesia    PONV   Graves disease 04/14/2020   Hypertension    Lymphedema    Mixed hyperlipidemia 02/20/2021   Past Surgical History:  Procedure Laterality Date   CATARACT EXTRACTION W/PHACO Right 04/14/2022   Procedure: CATARACT EXTRACTION PHACO AND INTRAOCULAR LENS PLACEMENT (IOC) RIGHT DIABETIC 10.93 00:53.0;  Surgeon: Galen Manila, MD;  Location: [facility];  Service: Ophthalmology;  Laterality: Right;   CATARACT EXTRACTION W/PHACO Left 04/28/2022   Procedure: CATARACT EXTRACTION PHACO AND INTRAOCULAR LENS PLACEMENT (IOC) LEFT DIABETIC 6.05 00:45.7;  Surgeon: Galen Manila, MD;  Location: [facility];  Service: Ophthalmology;  Laterality: Left;  Diabetic   CHOLECYSTECTOMY     HERNIA REPAIR     OTHER SURGICAL HISTORY     scar tissue removal from bowels   PARTIAL HYSTERECTOMY     Still has ovaries   REVERSE SHOULDER ARTHROPLASTY Right 09/11/2022   Procedure: REVERSE SHOULDER ARTHROPLASTY;  Surgeon: Yolonda Kida, MD;  Location: [facility];  Service: Orthopedics;  Laterality: Right;  120   UTERINE FIBROID SURGERY     Patient Active Problem List   Diagnosis Date Noted   Normocytic anemia 05/23/2022   AKI (acute kidney injury) (HCC) 05/21/2022   Snoring 11/05/2021   Daytime somnolence  11/05/2021   Coronary artery disease involving native coronary artery of native heart without angina pectoris 05/15/2021   Nonrheumatic tricuspid valve regurgitation 05/15/2021   Pulmonary hypertension, unspecified (HCC) 05/15/2021   Mixed hyperlipidemia 02/20/2021   Obesity (BMI 30-39.9) 02/20/2021   Diabetic retinopathy (HCC) 08/28/2020   Mixed incontinence 05/31/2020   Graves disease 04/14/2020   Right renal mass 11/22/2019   Arthritis of left knee 09/25/2019   Lymphedema 01/25/2017   Fibroid uterus 01/14/2017   Chronic kidney disease, stage 3 (HCC) 09/21/2016   Meralgia paresthetica of left side 08/27/2015   Diabetes mellitus type 2, controlled (HCC) 08/02/2015   Chronic venous insufficiency 06/28/2015   Essential hypertension 02/15/2015   Peripheral edema 02/15/2015   Chronic knee pain 02/15/2015    PCP: Pearline Cables, MD  REFERRING PROVIDER: Sheppard Plumber, NP  REFERRING DIAG: I89.0  THERAPY DIAG:  Lymphedema, not elsewhere classified  Rationale for Evaluation and Treatment: Rehabilitation  ONSET DATE: insidious onset >30 years  SUBJECTIVE:  SUBJECTIVE STATEMENT: Mrs Emily Wood presents to Occupational Therapy for treatment of BLE lymphedema. She arrives with knee length, multilayer compression wraps in place on RLE. She is unaccompanied and walks to clinic. Pt denies LE-related leg pain this morning. She reports OA related R knee pain at  6/10 R. "I  get my shot on Friday and talk about surgery."  PERTINENT HISTORY:  Medical Hx contributing to, or affected by chronic, progressive lymphedema: Norvasc prescription-common side effect is leg swelling; HTN, CVI, Obesity, CAD, OA knees, CKD stage 3, Graves Disease, DM  PAIN:  Are you having pain? Yes: R arthritis knee pain;  6/10 Pain  description: stiff, aching, throbbing, tight, heavy Aggravating factors: standing , walking,  Relieving factors: "The medication from my cardiologist", elevation, compression stockings ( Pt has good hip AROM bilaterally and is able to don them herself w extra time in clinic.)  PRECAUTIONS: Fall and Other: LYMPHEDEMA PRECAUTIONS: (CKD, CAD and DM, GRAVES )   WEIGHT BEARING RESTRICTIONS: No  FALLS:  Has patient fallen since last visit? NO    HAND DOMINANCE: right   PRIOR LEVEL OF FUNCTION: Requires assistive device for independence, Needs assistance with homemaking, and Needs assistance with gait  PATIENT GOALS: ".... to get this lymphedema under control. It's embarrassing. That's why I wear long skirts."   OBJECTIVE:   Mild, Stage  II, Bilateral Lower Extremity Lymphedema 2/2 CVI and Obesity  OBSERVATIONS / OTHER ASSESSMENTS:   Lymphedema Life Impact Scape (LLIS): Intake 11/03/22: 50% (The extent to which L:E-related problems affected your life during the past week)  FOTO functional outcome scale: Intake 11/16/22: 63%  BLE COMPARATIVE LIMB VOLUMETRICS:   Intake measurements completed 10/22/22  LANDMARK RIGHT   R LEG (A-D) 5043.4 ml  R THIGH (E-G) 4182.3 ml  R FULL LIMB (A-G) 7643.8 ml  Limb Volume differential (LVD)   Leg LVD =12.14%, L>R Thigh LVD 8.3%, L>R Full limb LVD= 8.3%   Volume change since initial %  Volume change overall V  (Blank rows = not tested)  LANDMARK LEFT    R LEG (A-D) 5656.0 ml  R THIGH (E-G) 4529.5 ml  R FULL LIMB (A-G) 10185.5 ml  Limb Volume differential (LVD)  %  Volume change since initial %  Volume change overall %  (Blank rows = not tested)   9th Visit: 12/07/22  [facility] RIGHT   R LEG (A-D) 5116.3 ml  R THIGH (E-G) 5047.6  ml  R FULL LIMB (A-G) 10163.9  ml  Limb Volume differential (LVD)    Volume change since initial R LEG volume INCREASED by 1.4%;  R THIGH INCREASED by 20.7%, and R FULL LIMB INCREASED by 10.17% since initial on  10/22/22.  Volume change overall V  (Blank rows = not tested)       19 th visit 01/18/23  LANDMARK RIGHT   R LEG (A-D) 4606.5 ml  R THIGH (E-G) 4617.0 ml  R FULL LIMB (A-G) 9224 ml  Limb Volume differential (LVD)    Volume change since initial Since last measured on 9th Rx visit on 12/07/22, R LEG volume is DECREASED by 9.96 %, R THIGH volume is DECREASED by 8.5 %, and R FULL LIMB volume is DECREASED by 9.25 %   Volume change overall Since initially measured on 10/22/22 on 1st Rx visit,  R LEG volume is DECREASED by 8.67 % overall, R THIGH volume is INCREASED by 10 %, and R FULL LIMB volume is DECREASED by 0.018 %   (Blank rows =  not tested)  TODAY'S TREATMENT:  Progress review RLE Multilayer, knee length, compression wraps   Pt edu for LE self-care and progress towards goals to date                  PATIENT EDUCATION:  Pt edu for LE self care:  Continued Pt/ CG edu for lymphedema self care home program throughout session. Emphasis today on lymphedema precautions and cellulitis ID, prevention and Rx. Discussed again fluid dynamics re elevation, and importance of elevation for lymphedema management and venous return. Other topics include outcome of comparative limb volumetrics- starting limb volume differentials (LVDs), technology and gradient techniques used for short stretch, multilayer compression wrapping, simple self-MLD, therapeutic lymphatic pumping exercises, skin/nail care, LE precautions,. compression garment recommendations and specifications, wear and care schedule and compression garment donning / doffing w assistive devices. Discussed progress towards all OT goals since commencing CDT. All questions answered to the Pt's satisfaction. Good return. Person educated: Patient Education method: Explanation, Demonstration, Tactile cues, Verbal cues, and Handouts Education comprehension: verbalized understanding, returned demonstration, and needs further education  HOME EXERCISE  PROGRAM: Intensive Phase Complete Decongestive Therapy (CDT) includes Daily Manual lymphatic drainage: Short neck sequence bilaterally w diaphragmatic breathing to engage deep abdominal lymphatics. Stationary J strokes to inguinal LN, then proximal to distal dynamic J strokes to thigh, "bottleneck" region, leg and foot. Finally 3 retrograde sweeps back to terminus. Daily Skin care w/ low ph lotion matching skin ph to limit infection risk and improve tissue flexibility and excursion Daily, BLE lymphatic pumping therex- seated or supine Daily (23/7) multilayer compression bandages below the knee to reduce limb volume, one limb at a time (One each 8 cm, 10 cm and 12 cm wide x 5 meters long short stretch compression wrap applied circumferentially using gradient layering techniques over single layer of cotton stockinett an Rosidal foam- toes to popliteal fossa  Self-management Phase CDT includes: All of the above and Appropriate daytime compression garments and HOS devices full time daily. Replace q 3-6 months Elvarex, flat knit, ccl 225-32 mmHg) knee highs w open toe, t heel, oblique top edge, 2.5 cm wide SB on top, tricot patch at anterior ankle sewn on all 4 sides. To be work full time waking hours. [employer] RELAX knee high HOS device  Custom-made gradient compression garments and HOS devices are medically necessary in this case because they are uniquely sized and shaped to fit the exact dimensions of the affected extremities with deformities, and to provide accurate and consistent gradient compression and containment, essential to optimally managing this patient's symptoms of chronic, progressive lymphedema. Multiple custom compression garments are needed for optimal hygiene to limit infection risk. Custom compression garments should be replaced q 3-6 months When worn consistently for optimal lipo-lymphedema self-management over time.   ASSESSMENT:  CLINICAL IMPRESSION:Pt tolerated MLD with simultaneous  skin care to RLE without increased pain. We'll fit custom garment and device to RLE as soon as they arrive from DME vendor , and commence CDT to LLE once this is complete. Cont as per POC. Multilayer compression wraps applied below the knee on R. Pt denies increased pain with Rx. Once custom RLE compression garment/ devices are fitted, Pt will transition to self-management CDT on R and we'll commence Intensive CDT on the L. OT happy to assist Pt before/ after knee arthroplasty PRN. Cont as per POC.  OBJECTIVE IMPAIRMENTS: low vision, Abnormal gait, decreased activity tolerance, decreased balance, decreased endurance, decreased knowledge of condition, decreased knowledge of use of  DME, decreased mobility, decreased ROM, increased edema, impaired flexibility, postural dysfunction, obesity, and pain.   ACTIVITY LIMITATIONS: functional mobility and ambulation (carrying, lifting, bending, sitting, standing, squatting, sleeping, stairs, transfers, bed mobility, continence, Basic and instrumental ADLs (bathing, dressing, hygiene/grooming, Productive activities ( caring for others) and leisure pursuits, body image, and social participation  PERSONAL FACTORS:  multiple co morbidities, length of time with progressive condition, are also affecting patient's functional outcome.   REHAB POTENTIAL: Fair    EVALUATION COMPLEXITY: Moderate   GOALS: Goals reviewed with patient? Yes  SHORT TERM GOALS: Target date: 4th OT Rx visit   Pt will demonstrate understanding of lymphedema precautions and prevention strategies with modified independence using a printed reference to identify at least 5 precautions and discussing how s/he may implement them into daily life to reduce risk of progression with extra time (Modified Independence). Baseline:Max A Goal status: 12/07/22 GOAL MET  2.  Pt will be able to apply multilayer, thigh length, gradient, compression wraps to one leg at a time with modified assistance (extra  time) to decrease limb volume, to limit infection risk, and to limit lymphedema progression.  Baseline: Dependent Goal status: 11/16/22 PROGRESSING 11/26/22 GOAL MET  LONG TERM GOALS: Target date: 02/01/23  Given this patient's Intake score of 63/100% on the functional outcomes FOTO tool, patient will experience an increase in function of 3 points to improve basic and instrumental ADLs performance, including lymphedema self-care.  Baseline: ONGOING   Goal status:12/07/22 GOAL ONGOING,  01/20/23 PROGRESSING  2.  Given this patient's Intake score of 50.0 % on the Lymphedema Life Impact Scale (LLIS), patient will experience a reduction of at least 5 points in her perceived level of functional impairment resulting from lymphedema to improve functional performance and quality of life (QOL). Baseline: 50% Goal status: 12/07/22 GOAL ONGOING; 01/20/23 PROGRESSING  3.  During Intensive phase CDT Pt will achieve at least 85% compliance with all lymphedema self-care home program components, including daily skin care, compression wraps and /or garments, simple self MLD and lymphatic pumping therex, and elevation when sitting to habituate LE self care protocol  into ADLs for optimal LE self-management over time. Baseline: Dependent Goal status: 12/08/22 ONGOING. Pt not changing wraps daily as directed, but will work on increasing elevation anytime she is seated for next progress report. Pt will also increase frequency of re-wrapping her leg to ensure optimal volume reduction. GOAL MET 01/20/23  4.  Pt will achieve at least a 10% volume reduction below the knees to return limb to typical size and shape, to limit infection risk and LE progression, to decrease pain, to improve function. Baseline: dependent Goal status: 12/07/22 PROGRESSING 01/18/23 : Leg reduction= 8.67% since initial. Thigh  is Increased 10% since initial, And full LE is decreased by 0.018% since 11/11/22;  01/20/23 GOAL MET for L LEG w 9.96 % reduction . See  volumetric chart for additional details.  5.  Pt will obtain proper compression garments/compression braces and be modified independent with donning/doffing to optimize/stabilize with fluid reduction. Baseline:  Goal status: 12/07/22 PROGRESSING. 01/20/23 PROGRESSING.  PLAN:  PT FREQUENCY: 2x/week  PT DURATION: 12 weeks  PLANNED INTERVENTIONS: Therapeutic exercises, Therapeutic activity, Patient/Family education, Self Care, Manual lymph drainage, Compression bandaging, scar mobilization, Manual therapy, and skin care throughout MLD  PLAN FOR NEXT SESSION:  Knee length compression wraps to RLE below the knee Cont Pt edu for LE self care Continue MLD to RLE  Loel Dubonnet, MS, OTR/L, CLT-LANA 01/27/23 8:12 AM`

## 2023-01-29 ENCOUNTER — Other Ambulatory Visit: Payer: Self-pay | Admitting: Cardiology

## 2023-01-29 DIAGNOSIS — M1711 Unilateral primary osteoarthritis, right knee: Secondary | ICD-10-CM | POA: Diagnosis not present

## 2023-02-01 ENCOUNTER — Ambulatory Visit: Payer: Medicare Other | Admitting: Occupational Therapy

## 2023-02-01 ENCOUNTER — Encounter: Payer: Self-pay | Admitting: Occupational Therapy

## 2023-02-01 DIAGNOSIS — I890 Lymphedema, not elsewhere classified: Secondary | ICD-10-CM

## 2023-02-01 NOTE — Therapy (Signed)
OUTPATIENT OCCUPATIONAL THERAPY TREATMENT NOTE   LOWER EXTREMITY LYMPHEDEMA Patient Name: Emily Wood MRN: [id number] DOB:06-28-1951, 72 y.o., female Today's Date: 02/01/2023  END OF SESSION:   OT End of Session - 02/01/23 0817     Number of Visits 36    Date for OT Re-Evaluation 04/20/23    OT Start Time 0806    OT Stop Time 0910    OT Time Calculation (min) 64 min    Activity Tolerance Patient tolerated treatment well;No increased pain   Eval limited by Pt arriving late   Behavior During Therapy [facility] for tasks assessed/performed               Past Medical History:  Diagnosis Date   Arthritis    Diabetes mellitus without complication (HCC)    Family history of adverse reaction to anesthesia    PONV   Graves disease 04/14/2020   Hypertension    Lymphedema    Mixed hyperlipidemia 02/20/2021   Past Surgical History:  Procedure Laterality Date   CATARACT EXTRACTION W/PHACO Right 04/14/2022   Procedure: CATARACT EXTRACTION PHACO AND INTRAOCULAR LENS PLACEMENT (IOC) RIGHT DIABETIC 10.93 00:53.0;  Surgeon: Galen Manila, MD;  Location: [facility];  Service: Ophthalmology;  Laterality: Right;   CATARACT EXTRACTION W/PHACO Left 04/28/2022   Procedure: CATARACT EXTRACTION PHACO AND INTRAOCULAR LENS PLACEMENT (IOC) LEFT DIABETIC 6.05 00:45.7;  Surgeon: Galen Manila, MD;  Location: [facility];  Service: Ophthalmology;  Laterality: Left;  Diabetic   CHOLECYSTECTOMY     HERNIA REPAIR     OTHER SURGICAL HISTORY     scar tissue removal from bowels   PARTIAL HYSTERECTOMY     Still has ovaries   REVERSE SHOULDER ARTHROPLASTY Right 09/11/2022   Procedure: REVERSE SHOULDER ARTHROPLASTY;  Surgeon: Yolonda Kida, MD;  Location: [facility];  Service: Orthopedics;  Laterality: Right;  120   UTERINE FIBROID SURGERY     Patient Active Problem List   Diagnosis Date Noted   Normocytic anemia 05/23/2022   AKI (acute kidney injury) (HCC) 05/21/2022    Snoring 11/05/2021   Daytime somnolence 11/05/2021   Coronary artery disease involving native coronary artery of native heart without angina pectoris 05/15/2021   Nonrheumatic tricuspid valve regurgitation 05/15/2021   Pulmonary hypertension, unspecified (HCC) 05/15/2021   Mixed hyperlipidemia 02/20/2021   Obesity (BMI 30-39.9) 02/20/2021   Diabetic retinopathy (HCC) 08/28/2020   Mixed incontinence 05/31/2020   Graves disease 04/14/2020   Right renal mass 11/22/2019   Arthritis of left knee 09/25/2019   Lymphedema 01/25/2017   Fibroid uterus 01/14/2017   Chronic kidney disease, stage 3 (HCC) 09/21/2016   Meralgia paresthetica of left side 08/27/2015   Diabetes mellitus type 2, controlled (HCC) 08/02/2015   Chronic venous insufficiency 06/28/2015   Essential hypertension 02/15/2015   Peripheral edema 02/15/2015   Chronic knee pain 02/15/2015    PCP: Pearline Cables, MD  REFERRING PROVIDER: Sheppard Plumber, NP  REFERRING DIAG: I89.0  THERAPY DIAG:  Lymphedema, not elsewhere classified  Rationale for Evaluation and Treatment: Rehabilitation  ONSET DATE: insidious onset >30 years  SUBJECTIVE:  SUBJECTIVE STATEMENT: Emily Wood presents to Occupational Therapy for treatment of BLE lymphedema. She arrives with knee length, multilayer compression wraps in place on RLE. She is unaccompanied and walks to clinic. Pt denies LE-related leg pain this morning. She reports OA related R knee pain at  6/10 R. Pt denies new complaints or concerns.  PERTINENT HISTORY:  Medical Hx contributing to, or affected by chronic, progressive lymphedema: Norvasc prescription-common side effect is leg swelling; HTN, CVI, Obesity, CAD, OA knees, CKD stage 3, Graves Disease, DM  PAIN:  Are you having pain? Yes: R  arthritis knee pain;  6/10 Pain description: stiff, aching, throbbing, tight, heavy Aggravating factors: standing , walking,  Relieving factors: "The medication from my cardiologist", elevation, compression stockings ( Pt has good hip AROM bilaterally and is able to don them herself w extra time in clinic.)  PRECAUTIONS: Fall and Other: LYMPHEDEMA PRECAUTIONS: (CKD, CAD and DM, GRAVES )   WEIGHT BEARING RESTRICTIONS: No  FALLS:  Has patient fallen since last visit? NO    HAND DOMINANCE: right   PRIOR LEVEL OF FUNCTION: Requires assistive device for independence, Needs assistance with homemaking, and Needs assistance with gait  PATIENT GOALS: ".... to get this lymphedema under control. It's embarrassing. That's why I wear long skirts."   OBJECTIVE:   Mild, Stage  II, Bilateral Lower Extremity Lymphedema 2/2 CVI and Obesity  OBSERVATIONS / OTHER ASSESSMENTS:   Lymphedema Life Impact Scape (LLIS): Intake 11/03/22: 50% (The extent to which L:E-related problems affected your life during the past week)  FOTO functional outcome scale: Intake 11/16/22: 63%  BLE COMPARATIVE LIMB VOLUMETRICS:   Intake measurements completed 10/22/22  LANDMARK RIGHT   R LEG (A-D) 5043.4 ml  R THIGH (E-G) 4182.3 ml  R FULL LIMB (A-G) 7643.8 ml  Limb Volume differential (LVD)   Leg LVD =12.14%, L>R Thigh LVD 8.3%, L>R Full limb LVD= 8.3%   Volume change since initial %  Volume change overall V  (Blank rows = not tested)  LANDMARK LEFT    R LEG (A-D) 5656.0 ml  R THIGH (E-G) 4529.5 ml  R FULL LIMB (A-G) 10185.5 ml  Limb Volume differential (LVD)  %  Volume change since initial %  Volume change overall %  (Blank rows = not tested)   9th Visit: 12/07/22  [facility] RIGHT   R LEG (A-D) 5116.3 ml  R THIGH (E-G) 5047.6  ml  R FULL LIMB (A-G) 10163.9  ml  Limb Volume differential (LVD)    Volume change since initial R LEG volume INCREASED by 1.4%;  R THIGH INCREASED by 20.7%, and R FULL LIMB  INCREASED by 10.17% since initial on 10/22/22.  Volume change overall V  (Blank rows = not tested)       19 th visit 01/18/23  LANDMARK RIGHT   R LEG (A-D) 4606.5 ml  R THIGH (E-G) 4617.0 ml  R FULL LIMB (A-G) 9224 ml  Limb Volume differential (LVD)    Volume change since initial Since last measured on 9th Rx visit on 12/07/22, R LEG volume is DECREASED by 9.96 %, R THIGH volume is DECREASED by 8.5 %, and R FULL LIMB volume is DECREASED by 9.25 %   Volume change overall Since initially measured on 10/22/22 on 1st Rx visit,  R LEG volume is DECREASED by 8.67 % overall, R THIGH volume is INCREASED by 10 %, and R FULL LIMB volume is DECREASED by 0.018 %   (Blank rows = not tested)  TODAY'S TREATMENT:  RLE/RLQ MLD with simultaneous skin care RLE Multilayer, knee length, compression wraps   Pt edu for LE self-care and progress towards goals to date                  PATIENT EDUCATION:  Pt edu for LE self care:  Continued Pt/ CG edu for lymphedema self care home program throughout session. Emphasis today on lymphedema precautions and cellulitis ID, prevention and Rx. Discussed again fluid dynamics re elevation, and importance of elevation for lymphedema management and venous return. Other topics include outcome of comparative limb volumetrics- starting limb volume differentials (LVDs), technology and gradient techniques used for short stretch, multilayer compression wrapping, simple self-MLD, therapeutic lymphatic pumping exercises, skin/nail care, LE precautions,. compression garment recommendations and specifications, wear and care schedule and compression garment donning / doffing w assistive devices. Discussed progress towards all OT goals since commencing CDT. All questions answered to the Pt's satisfaction. Good return. Person educated: Patient Education method: Explanation, Demonstration, Tactile cues, Verbal cues, and Handouts Education comprehension: verbalized understanding, returned  demonstration, and needs further education  HOME EXERCISE PROGRAM: Intensive Phase Complete Decongestive Therapy (CDT) includes Daily Manual lymphatic drainage: Short neck sequence bilaterally w diaphragmatic breathing to engage deep abdominal lymphatics. Stationary J strokes to inguinal LN, then proximal to distal dynamic J strokes to thigh, "bottleneck" region, leg and foot. Finally 3 retrograde sweeps back to terminus. Daily Skin care w/ low ph lotion matching skin ph to limit infection risk and improve tissue flexibility and excursion Daily, BLE lymphatic pumping therex- seated or supine Daily (23/7) multilayer compression bandages below the knee to reduce limb volume, one limb at a time (One each 8 cm, 10 cm and 12 cm wide x 5 meters long short stretch compression wrap applied circumferentially using gradient layering techniques over single layer of cotton stockinett an Rosidal foam- toes to popliteal fossa  Self-management Phase CDT includes: All of the above and Appropriate daytime compression garments and HOS devices full time daily. Replace q 3-6 months Elvarex, flat knit, ccl 225-32 mmHg) knee highs w open toe, t heel, oblique top edge, 2.5 cm wide SB on top, tricot patch at anterior ankle sewn on all 4 sides. To be work full time waking hours. [employer] RELAX knee high HOS device  Custom-made gradient compression garments and HOS devices are medically necessary in this case because they are uniquely sized and shaped to fit the exact dimensions of the affected extremities with deformities, and to provide accurate and consistent gradient compression and containment, essential to optimally managing this patient's symptoms of chronic, progressive lymphedema. Multiple custom compression garments are needed for optimal hygiene to limit infection risk. Custom compression garments should be replaced q 3-6 months When worn consistently for optimal lipo-lymphedema self-management over time.    ASSESSMENT:  CLINICAL IMPRESSION:Pt tolerated MLD with simultaneous skin care to reduce infection risk.  Multilayer compression wraps applied below the knee on R. Pt denies increased pain with Rx. Once custom RLE compression garment/ devices are fitted, Pt will transition to self-management CDT on R and we'll commence Intensive CDT on the L. Cont as per POC.  OBJECTIVE IMPAIRMENTS: low vision, Abnormal gait, decreased activity tolerance, decreased balance, decreased endurance, decreased knowledge of condition, decreased knowledge of use of DME, decreased mobility, decreased ROM, increased edema, impaired flexibility, postural dysfunction, obesity, and pain.   ACTIVITY LIMITATIONS: functional mobility and ambulation (carrying, lifting, bending, sitting, standing, squatting, sleeping, stairs, transfers, bed mobility, continence, Basic and instrumental ADLs (bathing, dressing, hygiene/grooming, Productive  activities ( caring for others) and leisure pursuits, body image, and social participation  PERSONAL FACTORS:  multiple co morbidities, length of time with progressive condition, are also affecting patient's functional outcome.   REHAB POTENTIAL: Fair    EVALUATION COMPLEXITY: Moderate   GOALS: Goals reviewed with patient? Yes  SHORT TERM GOALS: Target date: 4th OT Rx visit   Pt will demonstrate understanding of lymphedema precautions and prevention strategies with modified independence using a printed reference to identify at least 5 precautions and discussing how s/he may implement them into daily life to reduce risk of progression with extra time (Modified Independence). Baseline:Max A Goal status: 12/07/22 GOAL MET  2.  Pt will be able to apply multilayer, thigh length, gradient, compression wraps to one leg at a time with modified assistance (extra time) to decrease limb volume, to limit infection risk, and to limit lymphedema progression.  Baseline: Dependent Goal status: 11/16/22  PROGRESSING 11/26/22 GOAL MET  LONG TERM GOALS: Target date: 02/01/23  Given this patient's Intake score of 63/100% on the functional outcomes FOTO tool, patient will experience an increase in function of 3 points to improve basic and instrumental ADLs performance, including lymphedema self-care.  Baseline: ONGOING   Goal status:12/07/22 GOAL ONGOING,  01/20/23 PROGRESSING  2.  Given this patient's Intake score of 50.0 % on the Lymphedema Life Impact Scale (LLIS), patient will experience a reduction of at least 5 points in her perceived level of functional impairment resulting from lymphedema to improve functional performance and quality of life (QOL). Baseline: 50% Goal status: 12/07/22 GOAL ONGOING; 01/20/23 PROGRESSING  3.  During Intensive phase CDT Pt will achieve at least 85% compliance with all lymphedema self-care home program components, including daily skin care, compression wraps and /or garments, simple self MLD and lymphatic pumping therex, and elevation when sitting to habituate LE self care protocol  into ADLs for optimal LE self-management over time. Baseline: Dependent Goal status: 12/08/22 ONGOING. Pt not changing wraps daily as directed, but will work on increasing elevation anytime she is seated for next progress report. Pt will also increase frequency of re-wrapping her leg to ensure optimal volume reduction. GOAL MET 01/20/23  4.  Pt will achieve at least a 10% volume reduction below the knees to return limb to typical size and shape, to limit infection risk and LE progression, to decrease pain, to improve function. Baseline: dependent Goal status: 12/07/22 PROGRESSING 01/18/23 : Leg reduction= 8.67% since initial. Thigh  is Increased 10% since initial, And full LE is decreased by 0.018% since 11/11/22;  01/20/23 GOAL MET for L LEG w 9.96 % reduction . See volumetric chart for additional details.  5.  Pt will obtain proper compression garments/compression braces and be modified independent  with donning/doffing to optimize/stabilize with fluid reduction. Baseline:  Goal status: 12/07/22 PROGRESSING. 01/20/23 PROGRESSING.  PLAN:  PT FREQUENCY: 2x/week  PT DURATION: 12 weeks  PLANNED INTERVENTIONS: Therapeutic exercises, Therapeutic activity, Patient/Family education, Self Care, Manual lymph drainage, Compression bandaging, scar mobilization, Manual therapy, and skin care throughout MLD  PLAN FOR NEXT SESSION:  Knee length compression wraps to RLE below the knee Cont Pt edu for LE self care Continue MLD to RLE  Loel Dubonnet, MS, OTR/L, CLT-LANA 02/01/23 11:38 AM`

## 2023-02-03 ENCOUNTER — Encounter: Payer: Self-pay | Admitting: Occupational Therapy

## 2023-02-03 ENCOUNTER — Ambulatory Visit: Payer: Medicare Other | Admitting: Occupational Therapy

## 2023-02-03 DIAGNOSIS — I890 Lymphedema, not elsewhere classified: Secondary | ICD-10-CM

## 2023-02-03 NOTE — Therapy (Signed)
OUTPATIENT OCCUPATIONAL THERAPY TREATMENT NOTE   LOWER EXTREMITY LYMPHEDEMA Patient Name: Emily Wood MRN: [id number] DOB:26-May-1951, 72 y.o., female Today's Date: 02/03/2023  END OF SESSION:   OT End of Session - 02/03/23 0809     Visit Number 23    Number of Visits 36    Date for OT Re-Evaluation 04/20/23    OT Start Time 0805    OT Stop Time 0905    OT Time Calculation (min) 60 min    Activity Tolerance Patient tolerated treatment well;No increased pain   Eval limited by Pt arriving late   Behavior During Therapy [facility] for tasks assessed/performed               Past Medical History:  Diagnosis Date   Arthritis    Diabetes mellitus without complication (HCC)    Family history of adverse reaction to anesthesia    PONV   Graves disease 04/14/2020   Hypertension    Lymphedema    Mixed hyperlipidemia 02/20/2021   Past Surgical History:  Procedure Laterality Date   CATARACT EXTRACTION W/PHACO Right 04/14/2022   Procedure: CATARACT EXTRACTION PHACO AND INTRAOCULAR LENS PLACEMENT (IOC) RIGHT DIABETIC 10.93 00:53.0;  Surgeon: Galen Manila, MD;  Location: [facility];  Service: Ophthalmology;  Laterality: Right;   CATARACT EXTRACTION W/PHACO Left 04/28/2022   Procedure: CATARACT EXTRACTION PHACO AND INTRAOCULAR LENS PLACEMENT (IOC) LEFT DIABETIC 6.05 00:45.7;  Surgeon: Galen Manila, MD;  Location: [facility];  Service: Ophthalmology;  Laterality: Left;  Diabetic   CHOLECYSTECTOMY     HERNIA REPAIR     OTHER SURGICAL HISTORY     scar tissue removal from bowels   PARTIAL HYSTERECTOMY     Still has ovaries   REVERSE SHOULDER ARTHROPLASTY Right 09/11/2022   Procedure: REVERSE SHOULDER ARTHROPLASTY;  Surgeon: Yolonda Kida, MD;  Location: [facility];  Service: Orthopedics;  Laterality: Right;  120   UTERINE FIBROID SURGERY     Patient Active Problem List   Diagnosis Date Noted   Normocytic anemia 05/23/2022   AKI (acute kidney injury)  (HCC) 05/21/2022   Snoring 11/05/2021   Daytime somnolence 11/05/2021   Coronary artery disease involving native coronary artery of native heart without angina pectoris 05/15/2021   Nonrheumatic tricuspid valve regurgitation 05/15/2021   Pulmonary hypertension, unspecified (HCC) 05/15/2021   Mixed hyperlipidemia 02/20/2021   Obesity (BMI 30-39.9) 02/20/2021   Diabetic retinopathy (HCC) 08/28/2020   Mixed incontinence 05/31/2020   Graves disease 04/14/2020   Right renal mass 11/22/2019   Arthritis of left knee 09/25/2019   Lymphedema 01/25/2017   Fibroid uterus 01/14/2017   Chronic kidney disease, stage 3 (HCC) 09/21/2016   Meralgia paresthetica of left side 08/27/2015   Diabetes mellitus type 2, controlled (HCC) 08/02/2015   Chronic venous insufficiency 06/28/2015   Essential hypertension 02/15/2015   Peripheral edema 02/15/2015   Chronic knee pain 02/15/2015    PCP: Pearline Cables, MD  REFERRING PROVIDER: Sheppard Plumber, NP  REFERRING DIAG: I89.0  THERAPY DIAG:  Lymphedema, not elsewhere classified  Rationale for Evaluation and Treatment: Rehabilitation  ONSET DATE: insidious onset >30 years  SUBJECTIVE:  SUBJECTIVE STATEMENT: Mrs Crunkleton presents to Occupational Therapy for treatment of BLE lymphedema. She arrives with knee length, multilayer compression wraps in place on RLE. She is unaccompanied and walks to clinic. Pt denies LE-related leg pain this morning. Pt reports 1-2/ R knee pain since getting steroid    injection in it last week.   PERTINENT HISTORY:  Medical Hx contributing to, or affected by chronic, progressive lymphedema: Norvasc prescription-common side effect is leg swelling; HTN, CVI, Obesity, CAD, OA knees, CKD stage 3, Graves Disease, DM  PAIN:  Are you having  pain? Yes: R arthritis knee pain;  1-2/10 Pain description: stiff, aching, throbbing, tight, heavy Aggravating factors: standing , walking,  Relieving factors: "The medication from my cardiologist", elevation, compression stockings ( Pt has good hip AROM bilaterally and is able to don them herself w extra time in clinic.)  PRECAUTIONS: Fall and Other: LYMPHEDEMA PRECAUTIONS: (CKD, CAD and DM, GRAVES )   WEIGHT BEARING RESTRICTIONS: No  FALLS:  Has patient fallen since last visit? NO    HAND DOMINANCE: right   PRIOR LEVEL OF FUNCTION: Requires assistive device for independence, Needs assistance with homemaking, and Needs assistance with gait  PATIENT GOALS: ".... to get this lymphedema under control. It's embarrassing. That's why I wear long skirts."   OBJECTIVE:   Mild, Stage  II, Bilateral Lower Extremity Lymphedema 2/2 CVI and Obesity  OBSERVATIONS / OTHER ASSESSMENTS:   Lymphedema Life Impact Scape (LLIS): Intake 11/03/22: 50% (The extent to which L:E-related problems affected your life during the past week)  FOTO functional outcome scale: Intake 11/16/22: 63%  BLE COMPARATIVE LIMB VOLUMETRICS:   Intake measurements completed 10/22/22  LANDMARK RIGHT   R LEG (A-D) 5043.4 ml  R THIGH (E-G) 4182.3 ml  R FULL LIMB (A-G) 7643.8 ml  Limb Volume differential (LVD)   Leg LVD =12.14%, L>R Thigh LVD 8.3%, L>R Full limb LVD= 8.3%   Volume change since initial %  Volume change overall V  (Blank rows = not tested)  LANDMARK LEFT    R LEG (A-D) 5656.0 ml  R THIGH (E-G) 4529.5 ml  R FULL LIMB (A-G) 10185.5 ml  Limb Volume differential (LVD)  %  Volume change since initial %  Volume change overall %  (Blank rows = not tested)   9th Visit: 12/07/22  [facility] RIGHT   R LEG (A-D) 5116.3 ml  R THIGH (E-G) 5047.6  ml  R FULL LIMB (A-G) 10163.9  ml  Limb Volume differential (LVD)    Volume change since initial R LEG volume INCREASED by 1.4%;  R THIGH INCREASED by 20.7%, and R  FULL LIMB INCREASED by 10.17% since initial on 10/22/22.  Volume change overall V  (Blank rows = not tested)       19 th visit 01/18/23  LANDMARK RIGHT   R LEG (A-D) 4606.5 ml  R THIGH (E-G) 4617.0 ml  R FULL LIMB (A-G) 9224 ml  Limb Volume differential (LVD)    Volume change since initial Since last measured on 9th Rx visit on 12/07/22, R LEG volume is DECREASED by 9.96 %, R THIGH volume is DECREASED by 8.5 %, and R FULL LIMB volume is DECREASED by 9.25 %   Volume change overall Since initially measured on 10/22/22 on 1st Rx visit,  R LEG volume is DECREASED by 8.67 % overall, R THIGH volume is INCREASED by 10 %, and R FULL LIMB volume is DECREASED by 0.018 %   (Blank rows = not tested)  TODAY'S  TREATMENT:  RLE/RLQ MLD with simultaneous skin care RLE Multilayer, knee length, compression wraps   Pt edu for LE self-care and progress towards goals to date                  PATIENT EDUCATION:  Pt edu for LE self care:  Continued Pt/ CG edu for lymphedema self care home program throughout session. Emphasis today on lymphedema precautions and cellulitis ID, prevention and Rx. Discussed again fluid dynamics re elevation, and importance of elevation for lymphedema management and venous return. Other topics include outcome of comparative limb volumetrics- starting limb volume differentials (LVDs), technology and gradient techniques used for short stretch, multilayer compression wrapping, simple self-MLD, therapeutic lymphatic pumping exercises, skin/nail care, LE precautions,. compression garment recommendations and specifications, wear and care schedule and compression garment donning / doffing w assistive devices. Discussed progress towards all OT goals since commencing CDT. All questions answered to the Pt's satisfaction. Good return. Person educated: Patient Education method: Explanation, Demonstration, Tactile cues, Verbal cues, and Handouts Education comprehension: verbalized understanding, returned  demonstration, and needs further education  HOME EXERCISE PROGRAM: Intensive Phase Complete Decongestive Therapy (CDT) includes Daily Manual lymphatic drainage: Short neck sequence bilaterally w diaphragmatic breathing to engage deep abdominal lymphatics. Stationary J strokes to inguinal LN, then proximal to distal dynamic J strokes to thigh, "bottleneck" region, leg and foot. Finally 3 retrograde sweeps back to terminus. Daily Skin care w/ low ph lotion matching skin ph to limit infection risk and improve tissue flexibility and excursion Daily, BLE lymphatic pumping therex- seated or supine Daily (23/7) multilayer compression bandages below the knee to reduce limb volume, one limb at a time (One each 8 cm, 10 cm and 12 cm wide x 5 meters long short stretch compression wrap applied circumferentially using gradient layering techniques over single layer of cotton stockinett an Rosidal foam- toes to popliteal fossa  Self-management Phase CDT includes: All of the above and Appropriate daytime compression garments and HOS devices full time daily. Replace q 3-6 months Elvarex, flat knit, ccl 225-32 mmHg) knee highs w open toe, t heel, oblique top edge, 2.5 cm wide SB on top, tricot patch at anterior ankle sewn on all 4 sides. To be work full time waking hours. [employer]st RELAX knee high HOS device  Custom-made gradient compression garments and HOS devices are medically necessary in this case because they are uniquely sized and shaped to fit the exact dimensions of the affected extremities with deformities, and to provide accurate and consistent gradient compression and containment, essential to optimally managing this patient's symptoms of chronic, progressive lymphedema. Multiple custom compression garments are needed for optimal hygiene to limit infection risk. Custom compression garments should be replaced q 3-6 months When worn consistently for optimal lipo-lymphedema self-management over time.    ASSESSMENT:  CLINICAL IMPRESSION:Pt tolerated MLD with simultaneous skin care to reduce infection risk.  Multilayer compression wraps applied below the knee on R. Pt denies increased pain with Rx. Once custom RLE compression garment/ devices are fitted, Pt will transition to self-management CDT on R and we'll commence Intensive CDT on the L. Cont as per POC.  OBJECTIVE IMPAIRMENTS: low vision, Abnormal gait, decreased activity tolerance, decreased balance, decreased endurance, decreased knowledge of condition, decreased knowledge of use of DME, decreased mobility, decreased ROM, increased edema, impaired flexibility, postural dysfunction, obesity, and pain.   ACTIVITY LIMITATIONS: functional mobility and ambulation (carrying, lifting, bending, sitting, standing, squatting, sleeping, stairs, transfers, bed mobility, continence, Basic and instrumental ADLs (bathing, dressing,  hygiene/grooming, Productive activities ( caring for others) and leisure pursuits, body image, and social participation  PERSONAL FACTORS:  multiple co morbidities, length of time with progressive condition, are also affecting patient's functional outcome.   REHAB POTENTIAL: Fair    EVALUATION COMPLEXITY: Moderate   GOALS: Goals reviewed with patient? Yes  SHORT TERM GOALS: Target date: 4th OT Rx visit   Pt will demonstrate understanding of lymphedema precautions and prevention strategies with modified independence using a printed reference to identify at least 5 precautions and discussing how s/he may implement them into daily life to reduce risk of progression with extra time (Modified Independence). Baseline:Max A Goal status: 12/07/22 GOAL MET  2.  Pt will be able to apply multilayer, thigh length, gradient, compression wraps to one leg at a time with modified assistance (extra time) to decrease limb volume, to limit infection risk, and to limit lymphedema progression.  Baseline: Dependent Goal status: 11/16/22  PROGRESSING 11/26/22 GOAL MET  LONG TERM GOALS: Target date: 02/01/23  Given this patient's Intake score of 63/100% on the functional outcomes FOTO tool, patient will experience an increase in function of 3 points to improve basic and instrumental ADLs performance, including lymphedema self-care.  Baseline: ONGOING   Goal status:12/07/22 GOAL ONGOING,  01/20/23 PROGRESSING  2.  Given this patient's Intake score of 50.0 % on the Lymphedema Life Impact Scale (LLIS), patient will experience a reduction of at least 5 points in her perceived level of functional impairment resulting from lymphedema to improve functional performance and quality of life (QOL). Baseline: 50% Goal status: 12/07/22 GOAL ONGOING; 01/20/23 PROGRESSING  3.  During Intensive phase CDT Pt will achieve at least 85% compliance with all lymphedema self-care home program components, including daily skin care, compression wraps and /or garments, simple self MLD and lymphatic pumping therex, and elevation when sitting to habituate LE self care protocol  into ADLs for optimal LE self-management over time. Baseline: Dependent Goal status: 12/08/22 ONGOING. Pt not changing wraps daily as directed, but will work on increasing elevation anytime she is seated for next progress report. Pt will also increase frequency of re-wrapping her leg to ensure optimal volume reduction. GOAL MET 01/20/23  4.  Pt will achieve at least a 10% volume reduction below the knees to return limb to typical size and shape, to limit infection risk and LE progression, to decrease pain, to improve function. Baseline: dependent Goal status: 12/07/22 PROGRESSING 01/18/23 : Leg reduction= 8.67% since initial. Thigh  is Increased 10% since initial, And full LE is decreased by 0.018% since 11/11/22;  01/20/23 GOAL MET for L LEG w 9.96 % reduction . See volumetric chart for additional details.  5.  Pt will obtain proper compression garments/compression braces and be modified independent  with donning/doffing to optimize/stabilize with fluid reduction. Baseline:  Goal status: 12/07/22 PROGRESSING. 01/20/23 PROGRESSING.  PLAN:  PT FREQUENCY: 2x/week  PT DURATION: 12 weeks  PLANNED INTERVENTIONS: Therapeutic exercises, Therapeutic activity, Patient/Family education, Self Care, Manual lymph drainage, Compression bandaging, scar mobilization, Manual therapy, and skin care throughout MLD  PLAN FOR NEXT SESSION:  Knee length compression wraps to RLE below the knee Cont Pt edu for LE self care Continue MLD to RLE  Loel Dubonnet, MS, OTR/L, CLT-LANA 02/03/23 10:15 AM`

## 2023-02-08 ENCOUNTER — Ambulatory Visit: Payer: Medicare Other | Admitting: Occupational Therapy

## 2023-02-08 DIAGNOSIS — I890 Lymphedema, not elsewhere classified: Secondary | ICD-10-CM | POA: Diagnosis not present

## 2023-02-08 NOTE — Therapy (Signed)
OUTPATIENT OCCUPATIONAL THERAPY TREATMENT NOTE   LOWER EXTREMITY LYMPHEDEMA Patient Name: Emily NEUROTH MRN: [id number] DOB:18-Sep-1950, 72 y.o., female Today's Date: 02/08/2023  END OF SESSION:   OT End of Session - 02/08/23 0900     Visit Number 24    Number of Visits 36    Date for OT Re-Evaluation 04/20/23    OT Start Time 0800    OT Stop Time 0855    OT Time Calculation (min) 55 min    Activity Tolerance Patient tolerated treatment well;No increased pain   Eval limited by Pt arriving late   Behavior During Therapy [facility] for tasks assessed/performed               Past Medical History:  Diagnosis Date   Arthritis    Diabetes mellitus without complication (HCC)    Family history of adverse reaction to anesthesia    PONV   Graves disease 04/14/2020   Hypertension    Lymphedema    Mixed hyperlipidemia 02/20/2021   Past Surgical History:  Procedure Laterality Date   CATARACT EXTRACTION W/PHACO Right 04/14/2022   Procedure: CATARACT EXTRACTION PHACO AND INTRAOCULAR LENS PLACEMENT (IOC) RIGHT DIABETIC 10.93 00:53.0;  Surgeon: Galen Manila, MD;  Location: [facility];  Service: Ophthalmology;  Laterality: Right;   CATARACT EXTRACTION W/PHACO Left 04/28/2022   Procedure: CATARACT EXTRACTION PHACO AND INTRAOCULAR LENS PLACEMENT (IOC) LEFT DIABETIC 6.05 00:45.7;  Surgeon: Galen Manila, MD;  Location: [facility];  Service: Ophthalmology;  Laterality: Left;  Diabetic   CHOLECYSTECTOMY     HERNIA REPAIR     OTHER SURGICAL HISTORY     scar tissue removal from bowels   PARTIAL HYSTERECTOMY     Still has ovaries   REVERSE SHOULDER ARTHROPLASTY Right 09/11/2022   Procedure: REVERSE SHOULDER ARTHROPLASTY;  Surgeon: Yolonda Kida, MD;  Location: [facility];  Service: Orthopedics;  Laterality: Right;  120   UTERINE FIBROID SURGERY     Patient Active Problem List   Diagnosis Date Noted   Normocytic anemia 05/23/2022   AKI (acute kidney injury)  (HCC) 05/21/2022   Snoring 11/05/2021   Daytime somnolence 11/05/2021   Coronary artery disease involving native coronary artery of native heart without angina pectoris 05/15/2021   Nonrheumatic tricuspid valve regurgitation 05/15/2021   Pulmonary hypertension, unspecified (HCC) 05/15/2021   Mixed hyperlipidemia 02/20/2021   Obesity (BMI 30-39.9) 02/20/2021   Diabetic retinopathy (HCC) 08/28/2020   Mixed incontinence 05/31/2020   Graves disease 04/14/2020   Right renal mass 11/22/2019   Arthritis of left knee 09/25/2019   Lymphedema 01/25/2017   Fibroid uterus 01/14/2017   Chronic kidney disease, stage 3 (HCC) 09/21/2016   Meralgia paresthetica of left side 08/27/2015   Diabetes mellitus type 2, controlled (HCC) 08/02/2015   Chronic venous insufficiency 06/28/2015   Essential hypertension 02/15/2015   Peripheral edema 02/15/2015   Chronic knee pain 02/15/2015    PCP: Pearline Cables, MD  REFERRING PROVIDER: Sheppard Plumber, NP  REFERRING DIAG: I89.0  THERAPY DIAG:  Lymphedema, not elsewhere classified  Rationale for Evaluation and Treatment: Rehabilitation  ONSET DATE: insidious onset >30 years  SUBJECTIVE:  SUBJECTIVE STATEMENT: Emily Wood presents to Occupational Therapy for treatment of BLE lymphedema. She arrives with knee length, multilayer compression wraps in place on RLE. She is unaccompanied and walks to clinic. Pt denies LE-related leg pain this morning. Pt 's RLE custom garments are here for fitting.  PERTINENT HISTORY:  Medical Hx contributing to, or affected by chronic, progressive lymphedema: Norvasc prescription-common side effect is leg swelling; HTN, CVI, Obesity, CAD, OA knees, CKD stage 3, Graves Disease, DM  PAIN:  Are you having pain? Yes: R arthritis knee pain;   1-2/10 Pain description: stiff, aching, throbbing, tight, heavy Aggravating factors: standing , walking,  Relieving factors: "The medication from my cardiologist", elevation, compression stockings ( Pt has good hip AROM bilaterally and is able to don them herself w extra time in clinic.)  PRECAUTIONS: Fall and Other: LYMPHEDEMA PRECAUTIONS: (CKD, CAD and DM, GRAVES )   WEIGHT BEARING RESTRICTIONS: No  FALLS:  Has patient fallen since last visit? NO    HAND DOMINANCE: right   PRIOR LEVEL OF FUNCTION: Requires assistive device for independence, Needs assistance with homemaking, and Needs assistance with gait  PATIENT GOALS: ".... to get this lymphedema under control. It's embarrassing. That's why I wear long skirts."   OBJECTIVE:   Mild, Stage  II, Bilateral Lower Extremity Lymphedema 2/2 CVI and Obesity  OBSERVATIONS / OTHER ASSESSMENTS:   Lymphedema Life Impact Scape (LLIS): Intake 11/03/22: 50% (The extent to which L:E-related problems affected your life during the past week)  FOTO functional outcome scale: Intake 11/16/22: 63%  BLE COMPARATIVE LIMB VOLUMETRICS:   Intake measurements completed 10/22/22  LANDMARK RIGHT   R LEG (A-D) 5043.4 ml  R THIGH (E-G) 4182.3 ml  R FULL LIMB (A-G) 7643.8 ml  Limb Volume differential (LVD)   Leg LVD =12.14%, L>R Thigh LVD 8.3%, L>R Full limb LVD= 8.3%   Volume change since initial %  Volume change overall V  (Blank rows = not tested)  LANDMARK LEFT    R LEG (A-D) 5656.0 ml  R THIGH (E-G) 4529.5 ml  R FULL LIMB (A-G) 10185.5 ml  Limb Volume differential (LVD)  %  Volume change since initial %  Volume change overall %  (Blank rows = not tested)   9th Visit: 12/07/22  [facility] RIGHT   R LEG (A-D) 5116.3 ml  R THIGH (E-G) 5047.6  ml  R FULL LIMB (A-G) 10163.9  ml  Limb Volume differential (LVD)    Volume change since initial R LEG volume INCREASED by 1.4%;  R THIGH INCREASED by 20.7%, and R FULL LIMB INCREASED by 10.17% since  initial on 10/22/22.  Volume change overall V  (Blank rows = not tested)       19 th visit 01/18/23  LANDMARK RIGHT   R LEG (A-D) 4606.5 ml  R THIGH (E-G) 4617.0 ml  R FULL LIMB (A-G) 9224 ml  Limb Volume differential (LVD)    Volume change since initial Since last measured on 9th Rx visit on 12/07/22, R LEG volume is DECREASED by 9.96 %, R THIGH volume is DECREASED by 8.5 %, and R FULL LIMB volume is DECREASED by 9.25 %   Volume change overall Since initially measured on 10/22/22 on 1st Rx visit,  R LEG volume is DECREASED by 8.67 % overall, R THIGH volume is INCREASED by 10 %, and R FULL LIMB volume is DECREASED by 0.018 %   (Blank rows = not tested)  TODAY'S TREATMENT:  RLE/RLQ MLD with simultaneous skin care RLE  Multilayer, knee length, compression wraps   Pt edu for LE self-care and progress towards goals to date                  PATIENT EDUCATION:  Pt edu for LE self care:  Continued Pt/ CG edu for lymphedema self care home program throughout session. Emphasis today on lymphedema precautions and cellulitis ID, prevention and Rx. Discussed again fluid dynamics re elevation, and importance of elevation for lymphedema management and venous return. Other topics include outcome of comparative limb volumetrics- starting limb volume differentials (LVDs), technology and gradient techniques used for short stretch, multilayer compression wrapping, simple self-MLD, therapeutic lymphatic pumping exercises, skin/nail care, LE precautions,. compression garment recommendations and specifications, wear and care schedule and compression garment donning / doffing w assistive devices. Discussed progress towards all OT goals since commencing CDT. All questions answered to the Pt's satisfaction. Good return. Person educated: Patient Education method: Explanation, Demonstration, Tactile cues, Verbal cues, and Handouts Education comprehension: verbalized understanding, returned demonstration, and needs further  education  HOME EXERCISE PROGRAM: Intensive Phase Complete Decongestive Therapy (CDT) includes Daily Manual lymphatic drainage: Short neck sequence bilaterally w diaphragmatic breathing to engage deep abdominal lymphatics. Stationary J strokes to inguinal LN, then proximal to distal dynamic J strokes to thigh, "bottleneck" region, leg and foot. Finally 3 retrograde sweeps back to terminus. Daily Skin care w/ low ph lotion matching skin ph to limit infection risk and improve tissue flexibility and excursion Daily, BLE lymphatic pumping therex- seated or supine Daily (23/7) multilayer compression bandages below the knee to reduce limb volume, one limb at a time (One each 8 cm, 10 cm and 12 cm wide x 5 meters long short stretch compression wrap applied circumferentially using gradient layering techniques over single layer of cotton stockinett an Rosidal foam- toes to popliteal fossa  Self-management Phase CDT includes: All of the above and Appropriate daytime compression garments and HOS devices full time daily. Replace q 3-6 months Elvarex, flat knit, ccl 225-32 mmHg) knee highs w open toe, t heel, oblique top edge, 2.5 cm wide SB on top, tricot patch at anterior ankle sewn on all 4 sides. To be work full time waking hours. Jobst RELAX knee high HOS device  Custom-made gradient compression garments and HOS devices are medically necessary in this case because they are uniquely sized and shaped to fit the exact dimensions of the affected extremities with deformities, and to provide accurate and consistent gradient compression and containment, essential to optimally managing this patient's symptoms of chronic, progressive lymphedema. Multiple custom compression garments are needed for optimal hygiene to limit infection risk. Custom compression garments should be replaced q 3-6 months When worn consistently for optimal lipo-lymphedema self-management over time.   ASSESSMENT:  CLINICAL IMPRESSIONAfter  skilled teaching Pt able to don and doffinitial RLE custom compression knee high and HOS device using friction gloves and Tyvek slipper. Pt verbalized understanding of garment/ device wear schedule, replacement schedule, precautions and laundry instructions. Cnot as per POC w emphasis on LLE next session.  OBJECTIVE IMPAIRMENTS: low vision, Abnormal gait, decreased activity tolerance, decreased balance, decreased endurance, decreased knowledge of condition, decreased knowledge of use of DME, decreased mobility, decreased ROM, increased edema, impaired flexibility, postural dysfunction, obesity, and pain.   ACTIVITY LIMITATIONS: functional mobility and ambulation (carrying, lifting, bending, sitting, standing, squatting, sleeping, stairs, transfers, bed mobility, continence, Basic and instrumental ADLs (bathing, dressing, hygiene/grooming, Productive activities ( caring for others) and leisure pursuits, body image, and social participation  PERSONAL  FACTORS:  multiple co morbidities, length of time with progressive condition, are also affecting patient's functional outcome.   REHAB POTENTIAL: Fair    EVALUATION COMPLEXITY: Moderate   GOALS: Goals reviewed with patient? Yes  SHORT TERM GOALS: Target date: 4th OT Rx visit   Pt will demonstrate understanding of lymphedema precautions and prevention strategies with modified independence using a printed reference to identify at least 5 precautions and discussing how s/he may implement them into daily life to reduce risk of progression with extra time (Modified Independence). Baseline:Max A Goal status: 12/07/22 GOAL MET  2.  Pt will be able to apply multilayer, thigh length, gradient, compression wraps to one leg at a time with modified assistance (extra time) to decrease limb volume, to limit infection risk, and to limit lymphedema progression.  Baseline: Dependent Goal status: 11/16/22 PROGRESSING 11/26/22 GOAL MET  LONG TERM GOALS: Target date:  02/01/23  Given this patient's Intake score of 63/100% on the functional outcomes FOTO tool, patient will experience an increase in function of 3 points to improve basic and instrumental ADLs performance, including lymphedema self-care.  Baseline: ONGOING   Goal status:12/07/22 GOAL ONGOING,  01/20/23 PROGRESSING  2.  Given this patient's Intake score of 50.0 % on the Lymphedema Life Impact Scale (LLIS), patient will experience a reduction of at least 5 points in her perceived level of functional impairment resulting from lymphedema to improve functional performance and quality of life (QOL). Baseline: 50% Goal status: 12/07/22 GOAL ONGOING; 01/20/23 PROGRESSING  3.  During Intensive phase CDT Pt will achieve at least 85% compliance with all lymphedema self-care home program components, including daily skin care, compression wraps and /or garments, simple self MLD and lymphatic pumping therex, and elevation when sitting to habituate LE self care protocol  into ADLs for optimal LE self-management over time. Baseline: Dependent Goal status: 12/08/22 ONGOING. Pt not changing wraps daily as directed, but will work on increasing elevation anytime she is seated for next progress report. Pt will also increase frequency of re-wrapping her leg to ensure optimal volume reduction. GOAL MET 01/20/23  4.  Pt will achieve at least a 10% volume reduction below the knees to return limb to typical size and shape, to limit infection risk and LE progression, to decrease pain, to improve function. Baseline: dependent Goal status: 12/07/22 PROGRESSING 01/18/23 : Leg reduction= 8.67% since initial. Thigh  is Increased 10% since initial, And full LE is decreased by 0.018% since 11/11/22;  01/20/23 GOAL MET for L LEG w 9.96 % reduction . See volumetric chart for additional details.  5.  Pt will obtain proper compression garments/compression braces and be modified independent with donning/doffing to optimize/stabilize with fluid  reduction. Baseline:  Goal status: 12/07/22 PROGRESSING. 01/20/23 PROGRESSING.02/08/23 GOAL MET.  PLAN:  PT FREQUENCY: 2x/week  PT DURATION: 12 weeks  PLANNED INTERVENTIONS: Therapeutic exercises, Therapeutic activity, Patient/Family education, Self Care, Manual lymph drainage, Compression bandaging, scar mobilization, Manual therapy, and skin care throughout MLD  PLAN FOR NEXT SESSION:  MLD and Knee length compression wraps to LLE below the knee Cont Pt edu for LE self care Commence CDT to LLE. Complete assessment of R   custom garment and device.  Loel Dubonnet, MS, OTR/L, CLT-LANA 02/08/23 4:09 PM`

## 2023-02-10 ENCOUNTER — Ambulatory Visit: Payer: Medicare Other | Admitting: Occupational Therapy

## 2023-02-10 DIAGNOSIS — I890 Lymphedema, not elsewhere classified: Secondary | ICD-10-CM

## 2023-02-10 NOTE — Therapy (Signed)
OUTPATIENT OCCUPATIONAL THERAPY TREATMENT NOTE   LOWER EXTREMITY LYMPHEDEMA Patient Name: Emily Wood MRN: [id number] DOB:03/29/1951, 72 y.o., female Today's Date: 02/10/2023  END OF SESSION:   OT End of Session - 02/10/23 0822     Visit Number 25    Number of Visits 36    Date for OT Re-Evaluation 04/20/23    OT Start Time 0805    OT Stop Time 0905    OT Time Calculation (min) 60 min    Activity Tolerance Patient tolerated treatment well;No increased pain   Eval limited by Pt arriving late   Behavior During Therapy [facility] for tasks assessed/performed               Past Medical History:  Diagnosis Date   Arthritis    Diabetes mellitus without complication (HCC)    Family history of adverse reaction to anesthesia    PONV   Graves disease 04/14/2020   Hypertension    Lymphedema    Mixed hyperlipidemia 02/20/2021   Past Surgical History:  Procedure Laterality Date   CATARACT EXTRACTION W/PHACO Right 04/14/2022   Procedure: CATARACT EXTRACTION PHACO AND INTRAOCULAR LENS PLACEMENT (IOC) RIGHT DIABETIC 10.93 00:53.0;  Surgeon: Galen Manila, MD;  Location: [facility];  Service: Ophthalmology;  Laterality: Right;   CATARACT EXTRACTION W/PHACO Left 04/28/2022   Procedure: CATARACT EXTRACTION PHACO AND INTRAOCULAR LENS PLACEMENT (IOC) LEFT DIABETIC 6.05 00:45.7;  Surgeon: Galen Manila, MD;  Location: [facility];  Service: Ophthalmology;  Laterality: Left;  Diabetic   CHOLECYSTECTOMY     HERNIA REPAIR     OTHER SURGICAL HISTORY     scar tissue removal from bowels   PARTIAL HYSTERECTOMY     Still has ovaries   REVERSE SHOULDER ARTHROPLASTY Right 09/11/2022   Procedure: REVERSE SHOULDER ARTHROPLASTY;  Surgeon: Yolonda Kida, MD;  Location: [facility];  Service: Orthopedics;  Laterality: Right;  120   UTERINE FIBROID SURGERY     Patient Active Problem List   Diagnosis Date Noted   Normocytic anemia 05/23/2022   AKI (acute kidney injury)  (HCC) 05/21/2022   Snoring 11/05/2021   Daytime somnolence 11/05/2021   Coronary artery disease involving native coronary artery of native heart without angina pectoris 05/15/2021   Nonrheumatic tricuspid valve regurgitation 05/15/2021   Pulmonary hypertension, unspecified (HCC) 05/15/2021   Mixed hyperlipidemia 02/20/2021   Obesity (BMI 30-39.9) 02/20/2021   Diabetic retinopathy (HCC) 08/28/2020   Mixed incontinence 05/31/2020   Graves disease 04/14/2020   Right renal mass 11/22/2019   Arthritis of left knee 09/25/2019   Lymphedema 01/25/2017   Fibroid uterus 01/14/2017   Chronic kidney disease, stage 3 (HCC) 09/21/2016   Meralgia paresthetica of left side 08/27/2015   Diabetes mellitus type 2, controlled (HCC) 08/02/2015   Chronic venous insufficiency 06/28/2015   Essential hypertension 02/15/2015   Peripheral edema 02/15/2015   Chronic knee pain 02/15/2015    PCP: Pearline Cables, MD  REFERRING PROVIDER: Sheppard Plumber, NP  REFERRING DIAG: I89.0  THERAPY DIAG:  Lymphedema, not elsewhere classified  Rationale for Evaluation and Treatment: Rehabilitation  ONSET DATE: insidious onset >30 years  SUBJECTIVE:  SUBJECTIVE STATEMENT: Emily Wood presents to Occupational Therapy for treatment of BLE lymphedema. She arrives with knee length, multilayer compression wraps in place on RLE. She is unaccompanied and walks to clinic. Pt denies LE-related leg pain this morning. Pt  states garment and HOS device issued last session are comfotrtable and fit well, but front opening of garment at toes seems a bit short.   PERTINENT HISTORY:  Medical Hx contributing to, or affected by chronic, progressive lymphedema: Norvasc prescription-common side effect is leg swelling; HTN, CVI, Obesity, CAD, OA knees,  CKD stage 3, Graves Disease, DM  PAIN:  Are you having pain? Yes: R arthritis knee pain;  1-2/10 Pain description: stiff, aching, throbbing, tight, heavy Aggravating factors: standing , walking,  Relieving factors: "The medication from my cardiologist", elevation, compression stockings ( Pt has good hip AROM bilaterally and is able to don them herself w extra time in clinic.)  PRECAUTIONS: Fall and Other: LYMPHEDEMA PRECAUTIONS: (CKD, CAD and DM, GRAVES )   WEIGHT BEARING RESTRICTIONS: No  FALLS:  Has patient fallen since last visit? NO    HAND DOMINANCE: right   PRIOR LEVEL OF FUNCTION: Requires assistive device for independence, Needs assistance with homemaking, and Needs assistance with gait  PATIENT GOALS: ".... to get this lymphedema under control. It's embarrassing. That's why I wear long skirts."   OBJECTIVE:   Mild, Stage  II, Bilateral Lower Extremity Lymphedema 2/2 CVI and Obesity  OBSERVATIONS / OTHER ASSESSMENTS:   Lymphedema Life Impact Scape (LLIS): Intake 11/03/22: 50% (The extent to which L:E-related problems affected your life during the past week)  FOTO functional outcome scale: Intake 11/16/22: 63%  BLE COMPARATIVE LIMB VOLUMETRICS:   Intake measurements completed 10/22/22  LANDMARK RIGHT   R LEG (A-D) 5043.4 ml  R THIGH (E-G) 4182.3 ml  R FULL LIMB (A-G) 7643.8 ml  Limb Volume differential (LVD)   Leg LVD =12.14%, L>R Thigh LVD 8.3%, L>R Full limb LVD= 8.3%   Volume change since initial %  Volume change overall V  (Blank rows = not tested)  LANDMARK LEFT    R LEG (A-D) 5656.0 ml  R THIGH (E-G) 4529.5 ml  R FULL LIMB (A-G) 10185.5 ml  Limb Volume differential (LVD)  %  Volume change since initial %  Volume change overall %  (Blank rows = not tested)   9th Visit: 12/07/22  [facility] RIGHT   R LEG (A-D) 5116.3 ml  R THIGH (E-G) 5047.6  ml  R FULL LIMB (A-G) 10163.9  ml  Limb Volume differential (LVD)    Volume change since initial R LEG  volume INCREASED by 1.4%;  R THIGH INCREASED by 20.7%, and R FULL LIMB INCREASED by 10.17% since initial on 10/22/22.  Volume change overall V  (Blank rows = not tested)       19 th visit 01/18/23  LANDMARK RIGHT   R LEG (A-D) 4606.5 ml  R THIGH (E-G) 4617.0 ml  R FULL LIMB (A-G) 9224 ml  Limb Volume differential (LVD)    Volume change since initial Since last measured on 9th Rx visit on 12/07/22, R LEG volume is DECREASED by 9.96 %, R THIGH volume is DECREASED by 8.5 %, and R FULL LIMB volume is DECREASED by 9.25 %   Volume change overall Since initially measured on 10/22/22 on 1st Rx visit,  R LEG volume is DECREASED by 8.67 % overall, R THIGH volume is INCREASED by 10 %, and R FULL LIMB volume is DECREASED by 0.018 %   (  Blank rows = not tested)  TODAY'S TREATMENT:  RLE/RLQ MLD with simultaneous skin care RLE Multilayer, knee length, compression wraps   Pt edu for LE self-care and progress towards goals to date                  PATIENT EDUCATION:  Pt edu for LE self care:  Continued Pt/ CG edu for lymphedema self care home program throughout session. Emphasis today on lymphedema precautions and cellulitis ID, prevention and Rx. Discussed again fluid dynamics re elevation, and importance of elevation for lymphedema management and venous return. Other topics include outcome of comparative limb volumetrics- starting limb volume differentials (LVDs), technology and gradient techniques used for short stretch, multilayer compression wrapping, simple self-MLD, therapeutic lymphatic pumping exercises, skin/nail care, LE precautions,. compression garment recommendations and specifications, wear and care schedule and compression garment donning / doffing w assistive devices. Discussed progress towards all OT goals since commencing CDT. All questions answered to the Pt's satisfaction. Good return. Person educated: Patient Education method: Explanation, Demonstration, Tactile cues, Verbal cues, and  Handouts Education comprehension: verbalized understanding, returned demonstration, and needs further education  HOME EXERCISE PROGRAM: Intensive Phase Complete Decongestive Therapy (CDT) includes Daily Manual lymphatic drainage: Short neck sequence bilaterally w diaphragmatic breathing to engage deep abdominal lymphatics. Stationary J strokes to inguinal LN, then proximal to distal dynamic J strokes to thigh, "bottleneck" region, leg and foot. Finally 3 retrograde sweeps back to terminus. Daily Skin care w/ low ph lotion matching skin ph to limit infection risk and improve tissue flexibility and excursion Daily, BLE lymphatic pumping therex- seated or supine Daily (23/7) multilayer compression bandages below the knee to reduce limb volume, one limb at a time (One each 8 cm, 10 cm and 12 cm wide x 5 meters long short stretch compression wrap applied circumferentially using gradient layering techniques over single layer of cotton stockinett an Rosidal foam- toes to popliteal fossa  Self-management Phase CDT includes: All of the above and Appropriate daytime compression garments and HOS devices full time daily. Replace q 3-6 months Elvarex, flat knit, ccl 225-32 mmHg) knee highs w open toe, t heel, oblique top edge, 2.5 cm wide SB on top, tricot patch at anterior ankle sewn on all 4 sides. To be work full time waking hours. Jobst RELAX knee high HOS device  Custom-made gradient compression garments and HOS devices are medically necessary in this case because they are uniquely sized and shaped to fit the exact dimensions of the affected extremities with deformities, and to provide accurate and consistent gradient compression and containment, essential to optimally managing this patient's symptoms of chronic, progressive lymphedema. Multiple custom compression garments are needed for optimal hygiene to limit infection risk. Custom compression garments should be replaced q 3-6 months When worn consistently  for optimal lipo-lymphedema self-management over time.   ASSESSMENT:  CLINICAL IMPRESSIOn: cOMPLETED rle GARMENT REMEASUREMENTS EXTENDING FOOT OPENING AND REQUESTING 2.5 CM WIDE SB. fAXED TO dme VENDOR AFTER SESSION.Commenced CDT to LLE/LLQ as established and applied compression wraps below the knee. Good tolerance for Rx without increased pain.  Cont as per POC>  OBJECTIVE IMPAIRMENTS: low vision, Abnormal gait, decreased activity tolerance, decreased balance, decreased endurance, decreased knowledge of condition, decreased knowledge of use of DME, decreased mobility, decreased ROM, increased edema, impaired flexibility, postural dysfunction, obesity, and pain.   ACTIVITY LIMITATIONS: functional mobility and ambulation (carrying, lifting, bending, sitting, standing, squatting, sleeping, stairs, transfers, bed mobility, continence, Basic and instrumental ADLs (bathing, dressing, hygiene/grooming, Productive activities (  caring for others) and leisure pursuits, body image, and social participation  PERSONAL FACTORS:  multiple co morbidities, length of time with progressive condition, are also affecting patient's functional outcome.   REHAB POTENTIAL: Fair    EVALUATION COMPLEXITY: Moderate   GOALS: Goals reviewed with patient? Yes  SHORT TERM GOALS: Target date: 4th OT Rx visit   Pt will demonstrate understanding of lymphedema precautions and prevention strategies with modified independence using a printed reference to identify at least 5 precautions and discussing how s/he may implement them into daily life to reduce risk of progression with extra time (Modified Independence). Baseline:Max A Goal status: 12/07/22 GOAL MET  2.  Pt will be able to apply multilayer, thigh length, gradient, compression wraps to one leg at a time with modified assistance (extra time) to decrease limb volume, to limit infection risk, and to limit lymphedema progression.  Baseline: Dependent Goal status: 11/16/22  PROGRESSING 11/26/22 GOAL MET  LONG TERM GOALS: Target date: 02/01/23  Given this patient's Intake score of 63/100% on the functional outcomes FOTO tool, patient will experience an increase in function of 3 points to improve basic and instrumental ADLs performance, including lymphedema self-care.  Baseline: ONGOING   Goal status:12/07/22 GOAL ONGOING,  01/20/23 PROGRESSING  2.  Given this patient's Intake score of 50.0 % on the Lymphedema Life Impact Scale (LLIS), patient will experience a reduction of at least 5 points in her perceived level of functional impairment resulting from lymphedema to improve functional performance and quality of life (QOL). Baseline: 50% Goal status: 12/07/22 GOAL ONGOING; 01/20/23 PROGRESSING  3.  During Intensive phase CDT Pt will achieve at least 85% compliance with all lymphedema self-care home program components, including daily skin care, compression wraps and /or garments, simple self MLD and lymphatic pumping therex, and elevation when sitting to habituate LE self care protocol  into ADLs for optimal LE self-management over time. Baseline: Dependent Goal status: 12/08/22 ONGOING. Pt not changing wraps daily as directed, but will work on increasing elevation anytime she is seated for next progress report. Pt will also increase frequency of re-wrapping her leg to ensure optimal volume reduction. GOAL MET 01/20/23  4.  Pt will achieve at least a 10% volume reduction below the knees to return limb to typical size and shape, to limit infection risk and LE progression, to decrease pain, to improve function. Baseline: dependent Goal status: 12/07/22 PROGRESSING 01/18/23 : Leg reduction= 8.67% since initial. Thigh  is Increased 10% since initial, And full LE is decreased by 0.018% since 11/11/22;  01/20/23 GOAL MET for L LEG w 9.96 % reduction . See volumetric chart for additional details.  5.  Pt will obtain proper compression garments/compression braces and be modified independent  with donning/doffing to optimize/stabilize with fluid reduction. Baseline:  Goal status: 12/07/22 PROGRESSING. 01/20/23 PROGRESSING.02/08/23 GOAL MET.  PLAN:  PT FREQUENCY: 2x/week  PT DURATION: 12 weeks  PLANNED INTERVENTIONS: Therapeutic exercises, Therapeutic activity, Patient/Family education, Self Care, Manual lymph drainage, Compression bandaging, scar mobilization, Manual therapy, and skin care throughout MLD  PLAN FOR NEXT SESSION:  MLD and Knee length compression wraps to LLE below the knee Cont Pt edu for LE self care Commence CDT to LLE. Complete assessment of R   custom garment and device.  Loel Dubonnet, MS, OTR/L, CLT-LANA 02/10/23 4:07 PM`

## 2023-02-15 ENCOUNTER — Ambulatory Visit: Payer: Medicare Other | Admitting: Occupational Therapy

## 2023-02-15 ENCOUNTER — Encounter: Payer: Self-pay | Admitting: Occupational Therapy

## 2023-02-15 DIAGNOSIS — I890 Lymphedema, not elsewhere classified: Secondary | ICD-10-CM | POA: Diagnosis not present

## 2023-02-15 NOTE — Therapy (Signed)
OUTPATIENT OCCUPATIONAL THERAPY TREATMENT NOTE   LOWER EXTREMITY LYMPHEDEMA Patient Name: Emily Wood MRN: [id number] DOB:[birth year], 72 y.o., female Today's Date: 02/15/2023  END OF SESSION:   OT End of Session - 02/15/23 0916     Visit Number 26    Number of Visits 36    Date for OT Re-Evaluation 04/20/23    OT Start Time 0810    OT Stop Time 0916    OT Time Calculation (min) 66 min    Activity Tolerance Patient tolerated treatment well;No increased pain   Eval limited by Pt arriving late   Behavior During Therapy [facility] for tasks assessed/performed               Past Medical History:  Diagnosis Date   Arthritis    Diabetes mellitus without complication (HCC)    Family history of adverse reaction to anesthesia    PONV   Graves disease 04/14/2020   Hypertension    Lymphedema    Mixed hyperlipidemia 02/20/2021   Past Surgical History:  Procedure Laterality Date   CATARACT EXTRACTION W/PHACO Right 04/14/2022   Procedure: CATARACT EXTRACTION PHACO AND INTRAOCULAR LENS PLACEMENT (IOC) RIGHT DIABETIC 10.93 00:53.0;  Surgeon: Galen Manila, MD;  Location: [facility];  Service: Ophthalmology;  Laterality: Right;   CATARACT EXTRACTION W/PHACO Left 04/28/2022   Procedure: CATARACT EXTRACTION PHACO AND INTRAOCULAR LENS PLACEMENT (IOC) LEFT DIABETIC 6.05 00:45.7;  Surgeon: Galen Manila, MD;  Location: [facility];  Service: Ophthalmology;  Laterality: Left;  Diabetic   CHOLECYSTECTOMY     HERNIA REPAIR     OTHER SURGICAL HISTORY     scar tissue removal from bowels   PARTIAL HYSTERECTOMY     Still has ovaries   REVERSE SHOULDER ARTHROPLASTY Right 09/11/2022   Procedure: REVERSE SHOULDER ARTHROPLASTY;  Surgeon: Yolonda Kida, MD;  Location: [facility];  Service: Orthopedics;  Laterality: Right;  120   UTERINE FIBROID SURGERY     Patient Active Problem List   Diagnosis Date Noted   Normocytic anemia 05/23/2022   AKI (acute kidney injury)  (HCC) 05/21/2022   Snoring 11/05/2021   Daytime somnolence 11/05/2021   Coronary artery disease involving native coronary artery of native heart without angina pectoris 05/15/2021   Nonrheumatic tricuspid valve regurgitation 05/15/2021   Pulmonary hypertension, unspecified (HCC) 05/15/2021   Mixed hyperlipidemia 02/20/2021   Obesity (BMI 30-39.9) 02/20/2021   Diabetic retinopathy (HCC) 08/28/2020   Mixed incontinence 05/31/2020   Graves disease 04/14/2020   Right renal mass 11/22/2019   Arthritis of left knee 09/25/2019   Lymphedema 01/25/2017   Fibroid uterus 01/14/2017   Chronic kidney disease, stage 3 (HCC) 09/21/2016   Meralgia paresthetica of left side 08/27/2015   Diabetes mellitus type 2, controlled (HCC) 08/02/2015   Chronic venous insufficiency 06/28/2015   Essential hypertension 02/15/2015   Peripheral edema 02/15/2015   Chronic knee pain 02/15/2015    PCP: Pearline Cables, MD  REFERRING PROVIDER: Sheppard Plumber, NP  REFERRING DIAG: I89.0  THERAPY DIAG:  Lymphedema, not elsewhere classified  Rationale for Evaluation and Treatment: Rehabilitation  ONSET DATE: insidious onset >30 years  SUBJECTIVE:  SUBJECTIVE STATEMENT: Emily Wood presents to Occupational Therapy for treatment of BLE lymphedema. She arrives without knee length, multilayer compression wraps in place on RLE. She is unaccompanied and walks to clinic. Pt denies LE-related leg pain this morning. Pt  states garment and HOS device issued last session are comfortable and fit well, but front opening of garment at toes seems a bit short. We re-measured garment to extend foot opening at toe end and requested narrower silicone band last session. Remake has not yet been delivered.  PERTINENT HISTORY:  Medical Hx contributing  to, or affected by chronic, progressive lymphedema: Norvasc prescription-common side effect is leg swelling; HTN, CVI, Obesity, CAD, OA knees, CKD stage 3, Graves Disease, DM  PAIN:  Are you having pain? Yes: R arthritis knee pain;  1-2/10 Pain description: stiff, aching, throbbing, tight, heavy Aggravating factors: standing , walking,  Relieving factors: "The medication from my cardiologist", elevation, compression stockings ( Pt has good hip AROM bilaterally and is able to don them herself w extra time in clinic.)  PRECAUTIONS: Fall and Other: LYMPHEDEMA PRECAUTIONS: (CKD, CAD and DM, GRAVES )   WEIGHT BEARING RESTRICTIONS: No  FALLS:  Has patient fallen since last visit? NO    HAND DOMINANCE: right   PRIOR LEVEL OF FUNCTION: Requires assistive device for independence, Needs assistance with homemaking, and Needs assistance with gait  PATIENT GOALS: ".... to get this lymphedema under control. It's embarrassing. That's why I wear long skirts."   OBJECTIVE:   Mild, Stage  II, Bilateral Lower Extremity Lymphedema 2/2 CVI and Obesity  OBSERVATIONS / OTHER ASSESSMENTS:   Lymphedema Life Impact Scape (LLIS): Intake 11/03/22: 50% (The extent to which L:E-related problems affected your life during the past week)  FOTO functional outcome scale: Intake 11/16/22: 63%  BLE COMPARATIVE LIMB VOLUMETRICS:   Intake measurements completed 10/22/22  LANDMARK RIGHT   R LEG (A-D) 5043.4 ml  R THIGH (E-G) 4182.3 ml  R FULL LIMB (A-G) 7643.8 ml  Limb Volume differential (LVD)   Leg LVD =12.14%, L>R Thigh LVD 8.3%, L>R Full limb LVD= 8.3%   Volume change since initial %  Volume change overall V  (Blank rows = not tested)  LANDMARK LEFT    R LEG (A-D) 5656.0 ml  R THIGH (E-G) 4529.5 ml  R FULL LIMB (A-G) 10185.5 ml  Limb Volume differential (LVD)  %  Volume change since initial %  Volume change overall %  (Blank rows = not tested)   9th Visit: 12/07/22  [facility] RIGHT   R LEG (A-D)  5116.3 ml  R THIGH (E-G) 5047.6  ml  R FULL LIMB (A-G) 10163.9  ml  Limb Volume differential (LVD)    Volume change since initial R LEG volume INCREASED by 1.4%;  R THIGH INCREASED by 20.7%, and R FULL LIMB INCREASED by 10.17% since initial on 10/22/22.  Volume change overall V  (Blank rows = not tested)       19 th visit 01/18/23  LANDMARK RIGHT   R LEG (A-D) 4606.5 ml  R THIGH (E-G) 4617.0 ml  R FULL LIMB (A-G) 9224 ml  Limb Volume differential (LVD)    Volume change since initial Since last measured on 9th Rx visit on 12/07/22, R LEG volume is DECREASED by 9.96 %, R THIGH volume is DECREASED by 8.5 %, and R FULL LIMB volume is DECREASED by 9.25 %   Volume change overall Since initially measured on 10/22/22 on 1st Rx visit,  R LEG volume is DECREASED  by 8.67 % overall, R THIGH volume is INCREASED by 10 %, and R FULL LIMB volume is DECREASED by 0.018 %   (Blank rows = not tested)  TODAY'S TREATMENT:  RLE/RLQ MLD with simultaneous skin care RLE Multilayer, knee length, compression wraps   Pt edu for LE self-care and progress towards goals to date                  PATIENT EDUCATION:  Pt edu for LE self care:  Continued Pt/ CG edu for lymphedema self care home program throughout session. Emphasis today on lymphedema precautions and cellulitis ID, prevention and Rx. Discussed again fluid dynamics re elevation, and importance of elevation for lymphedema management and venous return. Other topics include outcome of comparative limb volumetrics- starting limb volume differentials (LVDs), technology and gradient techniques used for short stretch, multilayer compression wrapping, simple self-MLD, therapeutic lymphatic pumping exercises, skin/nail care, LE precautions,. compression garment recommendations and specifications, wear and care schedule and compression garment donning / doffing w assistive devices. Discussed progress towards all OT goals since commencing CDT. All questions answered to the Pt's  satisfaction. Good return. Person educated: Patient Education method: Explanation, Demonstration, Tactile cues, Verbal cues, and Handouts Education comprehension: verbalized understanding, returned demonstration, and needs further education  HOME EXERCISE PROGRAM: Intensive Phase Complete Decongestive Therapy (CDT) includes Daily Manual lymphatic drainage: Short neck sequence bilaterally w diaphragmatic breathing to engage deep abdominal lymphatics. Stationary J strokes to inguinal LN, then proximal to distal dynamic J strokes to thigh, "bottleneck" region, leg and foot. Finally 3 retrograde sweeps back to terminus. Daily Skin care w/ low ph lotion matching skin ph to limit infection risk and improve tissue flexibility and excursion Daily, BLE lymphatic pumping therex- seated or supine Daily (23/7) multilayer compression bandages below the knee to reduce limb volume, one limb at a time (One each 8 cm, 10 cm and 12 cm wide x 5 meters long short stretch compression wrap applied circumferentially using gradient layering techniques over single layer of cotton stockinett an Rosidal foam- toes to popliteal fossa  Self-management Phase CDT includes: All of the above and Appropriate daytime compression garments and HOS devices full time daily. Replace q 3-6 months Elvarex, flat knit, ccl 225-32 mmHg) knee highs w open toe, t heel, oblique top edge, 2.5 cm wide SB on top, tricot patch at anterior ankle sewn on all 4 sides. To be work full time waking hours. [employer] RELAX knee high HOS device  Custom-made gradient compression garments and HOS devices are medically necessary in this case because they are uniquely sized and shaped to fit the exact dimensions of the affected extremities with deformities, and to provide accurate and consistent gradient compression and containment, essential to optimally managing this patient's symptoms of chronic, progressive lymphedema. Multiple custom compression garments are  needed for optimal hygiene to limit infection risk. Custom compression garments should be replaced q 3-6 months When worn consistently for optimal lipo-lymphedema self-management over time.   ASSESSMENT: CLINICAL IMPRESSION: Awaiting remake for LLE custom garment. Remake ordered on 02/10/23. No volumetric change observed in RLE below the knee since commencing CDT . LLE garment fit is excellent and contains swelling very well. Pt is compliant w wear schedule. Continued RLE/RLQ MLD today with skin care without increased pain. Applied knee length compression wraps using 2 circumferential layers at base of toes and using herringbone pattern at distal leg only. Good tolerance. Cont as per POC.  OBJECTIVE IMPAIRMENTS: low vision, Abnormal gait, decreased activity tolerance, decreased  balance, decreased endurance, decreased knowledge of condition, decreased knowledge of use of DME, decreased mobility, decreased ROM, increased edema, impaired flexibility, postural dysfunction, obesity, and pain.   ACTIVITY LIMITATIONS: functional mobility and ambulation (carrying, lifting, bending, sitting, standing, squatting, sleeping, stairs, transfers, bed mobility, continence, Basic and instrumental ADLs (bathing, dressing, hygiene/grooming, Productive activities ( caring for others) and leisure pursuits, body image, and social participation  PERSONAL FACTORS:  multiple co morbidities, length of time with progressive condition, are also affecting patient's functional outcome.   REHAB POTENTIAL: Fair    EVALUATION COMPLEXITY: Moderate   GOALS: Goals reviewed with patient? Yes  SHORT TERM GOALS: Target date: 4th OT Rx visit   Pt will demonstrate understanding of lymphedema precautions and prevention strategies with modified independence using a printed reference to identify at least 5 precautions and discussing how s/he may implement them into daily life to reduce risk of progression with extra time (Modified  Independence). Baseline:Max A Goal status: 12/07/22 GOAL MET  2.  Pt will be able to apply multilayer, thigh length, gradient, compression wraps to one leg at a time with modified assistance (extra time) to decrease limb volume, to limit infection risk, and to limit lymphedema progression.  Baseline: Dependent Goal status: 11/16/22 PROGRESSING 11/26/22 GOAL MET  LONG TERM GOALS: Target date: 02/01/23  Given this patient's Intake score of 63/100% on the functional outcomes FOTO tool, patient will experience an increase in function of 3 points to improve basic and instrumental ADLs performance, including lymphedema self-care.  Baseline: ONGOING   Goal status:12/07/22 GOAL ONGOING,  01/20/23 PROGRESSING  2.  Given this patient's Intake score of 50.0 % on the Lymphedema Life Impact Scale (LLIS), patient will experience a reduction of at least 5 points in her perceived level of functional impairment resulting from lymphedema to improve functional performance and quality of life (QOL). Baseline: 50% Goal status: 12/07/22 GOAL ONGOING; 01/20/23 PROGRESSING  3.  During Intensive phase CDT Pt will achieve at least 85% compliance with all lymphedema self-care home program components, including daily skin care, compression wraps and /or garments, simple self MLD and lymphatic pumping therex, and elevation when sitting to habituate LE self care protocol  into ADLs for optimal LE self-management over time. Baseline: Dependent Goal status: 12/08/22 ONGOING. Pt not changing wraps daily as directed, but will work on increasing elevation anytime she is seated for next progress report. Pt will also increase frequency of re-wrapping her leg to ensure optimal volume reduction. GOAL MET 01/20/23  4.  Pt will achieve at least a 10% volume reduction below the knees to return limb to typical size and shape, to limit infection risk and LE progression, to decrease pain, to improve function. Baseline: dependent Goal status: 12/07/22  PROGRESSING 01/18/23 : Leg reduction= 8.67% since initial. Thigh  is Increased 10% since initial, And full LE is decreased by 0.018% since 11/11/22;  01/20/23 GOAL MET for L LEG w 9.96 % reduction . See volumetric chart for additional details.  5.  Pt will obtain proper compression garments/compression braces and be modified independent with donning/doffing to optimize/stabilize with fluid reduction. Baseline:  Goal status: 12/07/22 PROGRESSING. 01/20/23 PROGRESSING.02/08/23 GOAL MET.  PLAN:  PT FREQUENCY: 2x/week  PT DURATION: 12 weeks  PLANNED INTERVENTIONS: Therapeutic exercises, Therapeutic activity, Patient/Family education, Self Care, Manual lymph drainage, Compression bandaging, scar mobilization, Manual therapy, and skin care throughout MLD  PLAN FOR NEXT SESSION:  MLD and Knee length compression wraps to LLE below the knee Cont Pt edu for LE self  care Commence CDT to LLE. Complete assessment of R   custom garment and device.  Loel Dubonnet, MS, OTR/L, CLT-LANA 02/15/23 9:22 AM`

## 2023-02-16 DIAGNOSIS — H30002 Unspecified focal chorioretinal inflammation, left eye: Secondary | ICD-10-CM | POA: Diagnosis not present

## 2023-02-17 ENCOUNTER — Ambulatory Visit: Payer: Medicare Other | Admitting: Occupational Therapy

## 2023-02-22 ENCOUNTER — Encounter: Payer: Self-pay | Admitting: Occupational Therapy

## 2023-02-22 ENCOUNTER — Ambulatory Visit: Payer: Medicare Other | Attending: Nurse Practitioner | Admitting: Occupational Therapy

## 2023-02-22 DIAGNOSIS — I890 Lymphedema, not elsewhere classified: Secondary | ICD-10-CM | POA: Diagnosis not present

## 2023-02-22 NOTE — Therapy (Signed)
OUTPATIENT OCCUPATIONAL THERAPY TREATMENT NOTE   LOWER EXTREMITY LYMPHEDEMA Patient Name: Emily Wood MRN: [id number] DOB:April 21, 1951, 72 y.o., female Today's Date: 02/22/2023  END OF SESSION:   OT End of Session - 02/22/23 0818     Visit Number 27    Number of Visits 36    Date for OT Re-Evaluation 04/20/23    OT Start Time 0803    OT Stop Time 0905    OT Time Calculation (min) 62 min    Activity Tolerance Patient tolerated treatment well;No increased pain   Eval limited by Pt arriving late   Behavior During Therapy [facility] for tasks assessed/performed               Past Medical History:  Diagnosis Date   Arthritis    Diabetes mellitus without complication (HCC)    Family history of adverse reaction to anesthesia    PONV   Graves disease 04/14/2020   Hypertension    Lymphedema    Mixed hyperlipidemia 02/20/2021   Past Surgical History:  Procedure Laterality Date   CATARACT EXTRACTION W/PHACO Right 04/14/2022   Procedure: CATARACT EXTRACTION PHACO AND INTRAOCULAR LENS PLACEMENT (IOC) RIGHT DIABETIC 10.93 00:53.0;  Surgeon: Galen Manila, MD;  Location: [facility];  Service: Ophthalmology;  Laterality: Right;   CATARACT EXTRACTION W/PHACO Left 04/28/2022   Procedure: CATARACT EXTRACTION PHACO AND INTRAOCULAR LENS PLACEMENT (IOC) LEFT DIABETIC 6.05 00:45.7;  Surgeon: Galen Manila, MD;  Location: [facility];  Service: Ophthalmology;  Laterality: Left;  Diabetic   CHOLECYSTECTOMY     HERNIA REPAIR     OTHER SURGICAL HISTORY     scar tissue removal from bowels   PARTIAL HYSTERECTOMY     Still has ovaries   REVERSE SHOULDER ARTHROPLASTY Right 09/11/2022   Procedure: REVERSE SHOULDER ARTHROPLASTY;  Surgeon: Yolonda Kida, MD;  Location: [facility];  Service: Orthopedics;  Laterality: Right;  120   UTERINE FIBROID SURGERY     Patient Active Problem List   Diagnosis Date Noted   Normocytic anemia 05/23/2022   AKI (acute kidney injury)  (HCC) 05/21/2022   Snoring 11/05/2021   Daytime somnolence 11/05/2021   Coronary artery disease involving native coronary artery of native heart without angina pectoris 05/15/2021   Nonrheumatic tricuspid valve regurgitation 05/15/2021   Pulmonary hypertension, unspecified (HCC) 05/15/2021   Mixed hyperlipidemia 02/20/2021   Obesity (BMI 30-39.9) 02/20/2021   Diabetic retinopathy (HCC) 08/28/2020   Mixed incontinence 05/31/2020   Graves disease 04/14/2020   Right renal mass 11/22/2019   Arthritis of left knee 09/25/2019   Lymphedema 01/25/2017   Fibroid uterus 01/14/2017   Chronic kidney disease, stage 3 (HCC) 09/21/2016   Meralgia paresthetica of left side 08/27/2015   Diabetes mellitus type 2, controlled (HCC) 08/02/2015   Chronic venous insufficiency 06/28/2015   Essential hypertension 02/15/2015   Peripheral edema 02/15/2015   Chronic knee pain 02/15/2015    PCP: Pearline Cables, MD  REFERRING PROVIDER: Sheppard Plumber, NP  REFERRING DIAG: I89.0  THERAPY DIAG:  Lymphedema, not elsewhere classified  Rationale for Evaluation and Treatment: Rehabilitation  ONSET DATE: insidious onset >30 years  SUBJECTIVE:  SUBJECTIVE STATEMENT: Emily Wood presents to Occupational Therapy for treatment of BLE lymphedema. She arrives with knee length, multilayer compression wraps in place on RLE and compression stocking in place on the L.  She is unaccompanied and walks to clinic. Pt denies LE-related leg pain, but continues with mild OA related knee pain. She has no new concerns this morning. Pt opts to change colors originally selected for remaining compression garments. Emailed DME vendor re request after session.   PERTINENT HISTORY:  Medical Hx contributing to, or affected by chronic, progressive  lymphedema: Norvasc prescription-common side effect is leg swelling; HTN, CVI, Obesity, CAD, OA knees, CKD stage 3, Graves Disease, DM  PAIN:  Are you having pain? Yes: R arthritis knee pain;  1-2/10 Pain description: stiff, aching, throbbing, tight, heavy Aggravating factors: standing , walking,  Relieving factors: "The medication from my cardiologist", elevation, compression stockings ( Pt has good hip AROM bilaterally and is able to don them herself w extra time in clinic.)  PRECAUTIONS: Fall and Other: LYMPHEDEMA PRECAUTIONS: (CKD, CAD and DM, GRAVES )   WEIGHT BEARING RESTRICTIONS: No  FALLS:  Has patient fallen since last visit? NO    HAND DOMINANCE: right   PRIOR LEVEL OF FUNCTION: Requires assistive device for independence, Needs assistance with homemaking, and Needs assistance with gait  PATIENT GOALS: ".... to get this lymphedema under control. It's embarrassing. That's why I wear long skirts."   OBJECTIVE:   Mild, Stage  II, Bilateral Lower Extremity Lymphedema 2/2 CVI and Obesity  OBSERVATIONS / OTHER ASSESSMENTS:   Lymphedema Life Impact Scape (LLIS): Intake 11/03/22: 50% (The extent to which L:E-related problems affected your life during the past week)  FOTO functional outcome scale: Intake 11/16/22: 63%  BLE COMPARATIVE LIMB VOLUMETRICS:   Intake measurements completed 10/22/22  LANDMARK RIGHT   R LEG (A-D) 5043.4 ml  R THIGH (E-G) 4182.3 ml  R FULL LIMB (A-G) 7643.8 ml  Limb Volume differential (LVD)   Leg LVD =12.14%, L>R Thigh LVD 8.3%, L>R Full limb LVD= 8.3%   Volume change since initial %  Volume change overall V  (Blank rows = not tested)  LANDMARK LEFT    R LEG (A-D) 5656.0 ml  R THIGH (E-G) 4529.5 ml  R FULL LIMB (A-G) 10185.5 ml  Limb Volume differential (LVD)  %  Volume change since initial %  Volume change overall %  (Blank rows = not tested)   9th Visit: 12/07/22  [facility] RIGHT   R LEG (A-D) 5116.3 ml  R THIGH (E-G) 5047.6  ml  R  FULL LIMB (A-G) 10163.9  ml  Limb Volume differential (LVD)    Volume change since initial R LEG volume INCREASED by 1.4%;  R THIGH INCREASED by 20.7%, and R FULL LIMB INCREASED by 10.17% since initial on 10/22/22.  Volume change overall V  (Blank rows = not tested)       19 th visit 01/18/23  LANDMARK RIGHT   R LEG (A-D) 4606.5 ml  R THIGH (E-G) 4617.0 ml  R FULL LIMB (A-G) 9224 ml  Limb Volume differential (LVD)    Volume change since initial Since last measured on 9th Rx visit on 12/07/22, R LEG volume is DECREASED by 9.96 %, R THIGH volume is DECREASED by 8.5 %, and R FULL LIMB volume is DECREASED by 9.25 %   Volume change overall Since initially measured on 10/22/22 on 1st Rx visit,  R LEG volume is DECREASED by 8.67 % overall, R THIGH volume is  INCREASED by 10 %, and R FULL LIMB volume is DECREASED by 0.018 %   (Blank rows = not tested)  TODAY'S TREATMENT:  RLE/RLQ MLD with simultaneous skin care RLE Multilayer, knee length, compression wraps   Pt edu for LE self-care and progress towards goals to date                  PATIENT EDUCATION:  Pt edu for LE self care:  Continued Pt/ CG edu for lymphedema self care home program throughout session. Emphasis today on lymphedema precautions and cellulitis ID, prevention and Rx. Discussed again fluid dynamics re elevation, and importance of elevation for lymphedema management and venous return. Other topics include outcome of comparative limb volumetrics- starting limb volume differentials (LVDs), technology and gradient techniques used for short stretch, multilayer compression wrapping, simple self-MLD, therapeutic lymphatic pumping exercises, skin/nail care, LE precautions,. compression garment recommendations and specifications, wear and care schedule and compression garment donning / doffing w assistive devices. Discussed progress towards all OT goals since commencing CDT. All questions answered to the Pt's satisfaction. Good return. Person  educated: Patient Education method: Explanation, Demonstration, Tactile cues, Verbal cues, and Handouts Education comprehension: verbalized understanding, returned demonstration, and needs further education  HOME EXERCISE PROGRAM: Intensive Phase Complete Decongestive Therapy (CDT) includes Daily Manual lymphatic drainage: Short neck sequence bilaterally w diaphragmatic breathing to engage deep abdominal lymphatics. Stationary J strokes to inguinal LN, then proximal to distal dynamic J strokes to thigh, "bottleneck" region, leg and foot. Finally 3 retrograde sweeps back to terminus. Daily Skin care w/ low ph lotion matching skin ph to limit infection risk and improve tissue flexibility and excursion Daily, BLE lymphatic pumping therex- seated or supine Daily (23/7) multilayer compression bandages below the knee to reduce limb volume, one limb at a time (One each 8 cm, 10 cm and 12 cm wide x 5 meters long short stretch compression wrap applied circumferentially using gradient layering techniques over single layer of cotton stockinett an Rosidal foam- toes to popliteal fossa  Self-management Phase CDT includes: All of the above and Appropriate daytime compression garments and HOS devices full time daily. Replace q 3-6 months Elvarex, flat knit, ccl 225-32 mmHg) knee highs w open toe, t heel, oblique top edge, 2.5 cm wide SB on top, tricot patch at anterior ankle sewn on all 4 sides. To be work full time waking hours. [employer]st RELAX knee high HOS device  Custom-made gradient compression garments and HOS devices are medically necessary in this case because they are uniquely sized and shaped to fit the exact dimensions of the affected extremities with deformities, and to provide accurate and consistent gradient compression and containment, essential to optimally managing this patient's symptoms of chronic, progressive lymphedema. Multiple custom compression garments are needed for optimal hygiene to limit  infection risk. Custom compression garments should be replaced q 3-6 months When worn consistently for optimal lipo-lymphedema self-management over time.   ASSESSMENT: CLINICAL IMPRESSION: Awaiting status update on remake for LLE custom garment. Remake ordered on 02/10/23. Continued RLE/RLQ MLD today with skin care without increased pain. Applied RLE knee length compression wraps using 2 circumferential layers at base of toes and using herringbone pattern at distal leg only. Good tolerance. Cont as per POC.  OBJECTIVE IMPAIRMENTS: low vision, Abnormal gait, decreased activity tolerance, decreased balance, decreased endurance, decreased knowledge of condition, decreased knowledge of use of DME, decreased mobility, decreased ROM, increased edema, impaired flexibility, postural dysfunction, obesity, and pain.   ACTIVITY LIMITATIONS: functional mobility and  ambulation (carrying, lifting, bending, sitting, standing, squatting, sleeping, stairs, transfers, bed mobility, continence, Basic and instrumental ADLs (bathing, dressing, hygiene/grooming, Productive activities ( caring for others) and leisure pursuits, body image, and social participation  PERSONAL FACTORS:  multiple co morbidities, length of time with progressive condition, are also affecting patient's functional outcome.   REHAB POTENTIAL: Fair    EVALUATION COMPLEXITY: Moderate   GOALS: Goals reviewed with patient? Yes  SHORT TERM GOALS: Target date: 4th OT Rx visit   Pt will demonstrate understanding of lymphedema precautions and prevention strategies with modified independence using a printed reference to identify at least 5 precautions and discussing how s/he may implement them into daily life to reduce risk of progression with extra time (Modified Independence). Baseline:Max A Goal status: 12/07/22 GOAL MET  2.  Pt will be able to apply multilayer, thigh length, gradient, compression wraps to one leg at a time with modified  assistance (extra time) to decrease limb volume, to limit infection risk, and to limit lymphedema progression.  Baseline: Dependent Goal status: 11/16/22 PROGRESSING 11/26/22 GOAL MET  LONG TERM GOALS: Target date: 02/01/23  Given this patient's Intake score of 63/100% on the functional outcomes FOTO tool, patient will experience an increase in function of 3 points to improve basic and instrumental ADLs performance, including lymphedema self-care.  Baseline: ONGOING   Goal status:12/07/22 GOAL ONGOING,  01/20/23 PROGRESSING  2.  Given this patient's Intake score of 50.0 % on the Lymphedema Life Impact Scale (LLIS), patient will experience a reduction of at least 5 points in her perceived level of functional impairment resulting from lymphedema to improve functional performance and quality of life (QOL). Baseline: 50% Goal status: 12/07/22 GOAL ONGOING; 01/20/23 PROGRESSING  3.  During Intensive phase CDT Pt will achieve at least 85% compliance with all lymphedema self-care home program components, including daily skin care, compression wraps and /or garments, simple self MLD and lymphatic pumping therex, and elevation when sitting to habituate LE self care protocol  into ADLs for optimal LE self-management over time. Baseline: Dependent Goal status: 12/08/22 ONGOING. Pt not changing wraps daily as directed, but will work on increasing elevation anytime she is seated for next progress report. Pt will also increase frequency of re-wrapping her leg to ensure optimal volume reduction. GOAL MET 01/20/23  4.  Pt will achieve at least a 10% volume reduction below the knees to return limb to typical size and shape, to limit infection risk and LE progression, to decrease pain, to improve function. Baseline: dependent Goal status: 12/07/22 PROGRESSING 01/18/23 : Leg reduction= 8.67% since initial. Thigh  is Increased 10% since initial, And full LE is decreased by 0.018% since 11/11/22;  01/20/23 GOAL MET for L LEG w 9.96 %  reduction . See volumetric chart for additional details.  5.  Pt will obtain proper compression garments/compression braces and be modified independent with donning/doffing to optimize/stabilize with fluid reduction. Baseline:  Goal status: 12/07/22 PROGRESSING. 01/20/23 PROGRESSING.02/08/23 GOAL MET.  PLAN:  PT FREQUENCY: 2x/week  PT DURATION: 12 weeks  PLANNED INTERVENTIONS: Therapeutic exercises, Therapeutic activity, Patient/Family education, Self Care, Manual lymph drainage, Compression bandaging, scar mobilization, Manual therapy, and skin care throughout MLD  PLAN FOR NEXT SESSION:  MLD and simultaneous skin care to RLE  Knee length multilayer compression wraps using gradient techniques to RLE Cont Pt edu for LE self care   Loel Dubonnet, MS, OTR/L, CLT-LANA 02/22/23 9:36 AM`

## 2023-02-24 ENCOUNTER — Ambulatory Visit: Payer: Medicare Other | Admitting: Occupational Therapy

## 2023-02-24 ENCOUNTER — Encounter: Payer: Self-pay | Admitting: Occupational Therapy

## 2023-02-24 DIAGNOSIS — I890 Lymphedema, not elsewhere classified: Secondary | ICD-10-CM

## 2023-02-24 NOTE — Therapy (Signed)
OUTPATIENT OCCUPATIONAL THERAPY TREATMENT NOTE   LOWER EXTREMITY LYMPHEDEMA Patient Name: Emily Wood MRN: [id number] DOB:12/20/1950, 72 y.o., female Today's Date: 02/24/2023  END OF SESSION:   OT End of Session - 02/24/23 0803     Visit Number 28    Number of Visits 36    Date for OT Re-Evaluation 04/20/23    OT Start Time 0803    OT Stop Time 0903    OT Time Calculation (min) 60 min    Activity Tolerance Patient tolerated treatment well;No increased pain   Eval limited by Pt arriving late   Behavior During Therapy [facility] for tasks assessed/performed               Past Medical History:  Diagnosis Date   Arthritis    Diabetes mellitus without complication (HCC)    Family history of adverse reaction to anesthesia    PONV   Graves disease 04/14/2020   Hypertension    Lymphedema    Mixed hyperlipidemia 02/20/2021   Past Surgical History:  Procedure Laterality Date   CATARACT EXTRACTION W/PHACO Right 04/14/2022   Procedure: CATARACT EXTRACTION PHACO AND INTRAOCULAR LENS PLACEMENT (IOC) RIGHT DIABETIC 10.93 00:53.0;  Surgeon: Galen Manila, MD;  Location: [facility];  Service: Ophthalmology;  Laterality: Right;   CATARACT EXTRACTION W/PHACO Left 04/28/2022   Procedure: CATARACT EXTRACTION PHACO AND INTRAOCULAR LENS PLACEMENT (IOC) LEFT DIABETIC 6.05 00:45.7;  Surgeon: Galen Manila, MD;  Location: [facility];  Service: Ophthalmology;  Laterality: Left;  Diabetic   CHOLECYSTECTOMY     HERNIA REPAIR     OTHER SURGICAL HISTORY     scar tissue removal from bowels   PARTIAL HYSTERECTOMY     Still has ovaries   REVERSE SHOULDER ARTHROPLASTY Right 09/11/2022   Procedure: REVERSE SHOULDER ARTHROPLASTY;  Surgeon: Yolonda Kida, MD;  Location: [facility];  Service: Orthopedics;  Laterality: Right;  120   UTERINE FIBROID SURGERY     Patient Active Problem List   Diagnosis Date Noted   Normocytic anemia 05/23/2022   AKI (acute kidney injury)  (HCC) 05/21/2022   Snoring 11/05/2021   Daytime somnolence 11/05/2021   Coronary artery disease involving native coronary artery of native heart without angina pectoris 05/15/2021   Nonrheumatic tricuspid valve regurgitation 05/15/2021   Pulmonary hypertension, unspecified (HCC) 05/15/2021   Mixed hyperlipidemia 02/20/2021   Obesity (BMI 30-39.9) 02/20/2021   Diabetic retinopathy (HCC) 08/28/2020   Mixed incontinence 05/31/2020   Graves disease 04/14/2020   Right renal mass 11/22/2019   Arthritis of left knee 09/25/2019   Lymphedema 01/25/2017   Fibroid uterus 01/14/2017   Chronic kidney disease, stage 3 (HCC) 09/21/2016   Meralgia paresthetica of left side 08/27/2015   Diabetes mellitus type 2, controlled (HCC) 08/02/2015   Chronic venous insufficiency 06/28/2015   Essential hypertension 02/15/2015   Peripheral edema 02/15/2015   Chronic knee pain 02/15/2015    PCP: Pearline Cables, MD  REFERRING PROVIDER: Sheppard Plumber, NP  REFERRING DIAG: I89.0  THERAPY DIAG:  Lymphedema, not elsewhere classified  Rationale for Evaluation and Treatment: Rehabilitation  ONSET DATE: insidious onset >30 years  SUBJECTIVE:  SUBJECTIVE STATEMENT: Mrs Ryser presents to Occupational Therapy for treatment of BLE lymphedema. She arrives with knee length, multilayer compression wraps in place on RLE and compression stocking in place on the L.  She is unaccompanied and walks to clinic. Pt denies LE-related leg pain, but continues with mild OA related knee pain. She has no new concerns.  PERTINENT HISTORY:  Medical Hx contributing to, or affected by chronic, progressive lymphedema: Norvasc prescription-common side effect is leg swelling; HTN, CVI, Obesity, CAD, OA knees, CKD stage 3, Graves Disease, DM  PAIN:   Are you having pain? Yes: R arthritis knee pain;  1-2/10 Pain description: stiff, aching, throbbing, tight, heavy Aggravating factors: standing , walking,  Relieving factors: "The medication from my cardiologist", elevation, compression stockings ( Pt has good hip AROM bilaterally and is able to don them herself w extra time in clinic.)  PRECAUTIONS: Fall and Other: LYMPHEDEMA PRECAUTIONS: (CKD, CAD and DM, GRAVES )   WEIGHT BEARING RESTRICTIONS: No  FALLS:  Has patient fallen since last visit? NO    HAND DOMINANCE: right   PRIOR LEVEL OF FUNCTION: Requires assistive device for independence, Needs assistance with homemaking, and Needs assistance with gait  PATIENT GOALS: ".... to get this lymphedema under control. It's embarrassing. That's why I wear long skirts."   OBJECTIVE:   Mild, Stage  II, Bilateral Lower Extremity Lymphedema 2/2 CVI and Obesity  OBSERVATIONS / OTHER ASSESSMENTS:   Lymphedema Life Impact Scape (LLIS): Intake 11/03/22: 50% (The extent to which L:E-related problems affected your life during the past week)  FOTO functional outcome scale: Intake 11/16/22: 63%  BLE COMPARATIVE LIMB VOLUMETRICS:   Intake measurements completed 10/22/22  LANDMARK RIGHT   R LEG (A-D) 5043.4 ml  R THIGH (E-G) 4182.3 ml  R FULL LIMB (A-G) 7643.8 ml  Limb Volume differential (LVD)   Leg LVD =12.14%, L>R Thigh LVD 8.3%, L>R Full limb LVD= 8.3%   Volume change since initial %  Volume change overall V  (Blank rows = not tested)  LANDMARK LEFT    R LEG (A-D) 5656.0 ml  R THIGH (E-G) 4529.5 ml  R FULL LIMB (A-G) 10185.5 ml  Limb Volume differential (LVD)  %  Volume change since initial %  Volume change overall %  (Blank rows = not tested)   9th Visit: 12/07/22  [facility] RIGHT   R LEG (A-D) 5116.3 ml  R THIGH (E-G) 5047.6  ml  R FULL LIMB (A-G) 10163.9  ml  Limb Volume differential (LVD)    Volume change since initial R LEG volume INCREASED by 1.4%;  R THIGH INCREASED  by 20.7%, and R FULL LIMB INCREASED by 10.17% since initial on 10/22/22.  Volume change overall V  (Blank rows = not tested)       19 th visit 01/18/23  LANDMARK RIGHT   R LEG (A-D) 4606.5 ml  R THIGH (E-G) 4617.0 ml  R FULL LIMB (A-G) 9224 ml  Limb Volume differential (LVD)    Volume change since initial Since last measured on 9th Rx visit on 12/07/22, R LEG volume is DECREASED by 9.96 %, R THIGH volume is DECREASED by 8.5 %, and R FULL LIMB volume is DECREASED by 9.25 %   Volume change overall Since initially measured on 10/22/22 on 1st Rx visit,  R LEG volume is DECREASED by 8.67 % overall, R THIGH volume is INCREASED by 10 %, and R FULL LIMB volume is DECREASED by 0.018 %   (Blank rows = not tested)  TODAY'S TREATMENT:  RLE/RLQ MLD with simultaneous skin care RLE Multilayer, knee length, compression wraps   Pt edu for LE self-care and progress towards goals to date                  PATIENT EDUCATION:  Pt edu for LE self care:  Continued Pt/ CG edu for lymphedema self care home program throughout session. Emphasis today on lymphedema precautions and cellulitis ID, prevention and Rx. Discussed again fluid dynamics re elevation, and importance of elevation for lymphedema management and venous return. Other topics include outcome of comparative limb volumetrics- starting limb volume differentials (LVDs), technology and gradient techniques used for short stretch, multilayer compression wrapping, simple self-MLD, therapeutic lymphatic pumping exercises, skin/nail care, LE precautions,. compression garment recommendations and specifications, wear and care schedule and compression garment donning / doffing w assistive devices. Discussed progress towards all OT goals since commencing CDT. All questions answered to the Pt's satisfaction. Good return. Person educated: Patient Education method: Explanation, Demonstration, Tactile cues, Verbal cues, and Handouts Education comprehension: verbalized  understanding, returned demonstration, and needs further education  HOME EXERCISE PROGRAM: Intensive Phase Complete Decongestive Therapy (CDT) includes Daily Manual lymphatic drainage: Short neck sequence bilaterally w diaphragmatic breathing to engage deep abdominal lymphatics. Stationary J strokes to inguinal LN, then proximal to distal dynamic J strokes to thigh, "bottleneck" region, leg and foot. Finally 3 retrograde sweeps back to terminus. Daily Skin care w/ low ph lotion matching skin ph to limit infection risk and improve tissue flexibility and excursion Daily, BLE lymphatic pumping therex- seated or supine Daily (23/7) multilayer compression bandages below the knee to reduce limb volume, one limb at a time (One each 8 cm, 10 cm and 12 cm wide x 5 meters long short stretch compression wrap applied circumferentially using gradient layering techniques over single layer of cotton stockinett an Rosidal foam- toes to popliteal fossa  Self-management Phase CDT includes: All of the above and Appropriate daytime compression garments and HOS devices full time daily. Replace q 3-6 months Elvarex, flat knit, ccl 225-32 mmHg) knee highs w open toe, t heel, oblique top edge, 2.5 cm wide SB on top, tricot patch at anterior ankle sewn on all 4 sides. To be work full time waking hours. Jobst RELAX knee high HOS device  Custom-made gradient compression garments and HOS devices are medically necessary in this case because they are uniquely sized and shaped to fit the exact dimensions of the affected extremities with deformities, and to provide accurate and consistent gradient compression and containment, essential to optimally managing this patient's symptoms of chronic, progressive lymphedema. Multiple custom compression garments are needed for optimal hygiene to limit infection risk. Custom compression garments should be replaced q 3-6 months When worn consistently for optimal lipo-lymphedema self-management  over time.   ASSESSMENT: CLINICAL IMPRESSION: Awaiting status update on remake for LLE custom garment. Remake ordered on 02/10/23. Continued RLE/RLQ MLD today with skin care without increased pain. Applied RLE knee length compression wraps using 2 circumferential layers at base of toes and using herringbone pattern at distal leg only. Good tolerance. Cont as per POC.  OBJECTIVE IMPAIRMENTS: low vision, Abnormal gait, decreased activity tolerance, decreased balance, decreased endurance, decreased knowledge of condition, decreased knowledge of use of DME, decreased mobility, decreased ROM, increased edema, impaired flexibility, postural dysfunction, obesity, and pain.   ACTIVITY LIMITATIONS: functional mobility and ambulation (carrying, lifting, bending, sitting, standing, squatting, sleeping, stairs, transfers, bed mobility, continence, Basic and instrumental ADLs (bathing, dressing, hygiene/grooming, Productive activities (  caring for others) and leisure pursuits, body image, and social participation  PERSONAL FACTORS:  multiple co morbidities, length of time with progressive condition, are also affecting patient's functional outcome.   REHAB POTENTIAL: Fair    EVALUATION COMPLEXITY: Moderate   GOALS: Goals reviewed with patient? Yes  SHORT TERM GOALS: Target date: 4th OT Rx visit   Pt will demonstrate understanding of lymphedema precautions and prevention strategies with modified independence using a printed reference to identify at least 5 precautions and discussing how s/he may implement them into daily life to reduce risk of progression with extra time (Modified Independence). Baseline:Max A Goal status: 12/07/22 GOAL MET  2.  Pt will be able to apply multilayer, thigh length, gradient, compression wraps to one leg at a time with modified assistance (extra time) to decrease limb volume, to limit infection risk, and to limit lymphedema progression.  Baseline: Dependent Goal status:  11/16/22 PROGRESSING 11/26/22 GOAL MET  LONG TERM GOALS: Target date: 02/01/23  Given this patient's Intake score of 63/100% on the functional outcomes FOTO tool, patient will experience an increase in function of 3 points to improve basic and instrumental ADLs performance, including lymphedema self-care.  Baseline: ONGOING   Goal status:12/07/22 GOAL ONGOING,  01/20/23 PROGRESSING  2.  Given this patient's Intake score of 50.0 % on the Lymphedema Life Impact Scale (LLIS), patient will experience a reduction of at least 5 points in her perceived level of functional impairment resulting from lymphedema to improve functional performance and quality of life (QOL). Baseline: 50% Goal status: 12/07/22 GOAL ONGOING; 01/20/23 PROGRESSING  3.  During Intensive phase CDT Pt will achieve at least 85% compliance with all lymphedema self-care home program components, including daily skin care, compression wraps and /or garments, simple self MLD and lymphatic pumping therex, and elevation when sitting to habituate LE self care protocol  into ADLs for optimal LE self-management over time. Baseline: Dependent Goal status: 12/08/22 ONGOING. Pt not changing wraps daily as directed, but will work on increasing elevation anytime she is seated for next progress report. Pt will also increase frequency of re-wrapping her leg to ensure optimal volume reduction. GOAL MET 01/20/23  4.  Pt will achieve at least a 10% volume reduction below the knees to return limb to typical size and shape, to limit infection risk and LE progression, to decrease pain, to improve function. Baseline: dependent Goal status: 12/07/22 PROGRESSING 01/18/23 : Leg reduction= 8.67% since initial. Thigh  is Increased 10% since initial, And full LE is decreased by 0.018% since 11/11/22;  01/20/23 GOAL MET for L LEG w 9.96 % reduction . See volumetric chart for additional details.  5.  Pt will obtain proper compression garments/compression braces and be modified  independent with donning/doffing to optimize/stabilize with fluid reduction. Baseline:  Goal status: 12/07/22 PROGRESSING. 01/20/23 PROGRESSING.02/08/23 GOAL MET.  PLAN:  PT FREQUENCY: 2x/week  PT DURATION: 12 weeks  PLANNED INTERVENTIONS: Therapeutic exercises, Therapeutic activity, Patient/Family education, Self Care, Manual lymph drainage, Compression bandaging, scar mobilization, Manual therapy, and skin care throughout MLD  PLAN FOR NEXT SESSION:  MLD and simultaneous skin care to RLE  RLE multilayer compression wraps as established Cont Pt edu for LE self care   Loel Dubonnet, MS, OTR/L, CLT-LANA 02/24/23 2:00 PM`

## 2023-03-01 ENCOUNTER — Ambulatory Visit: Payer: Medicare Other | Admitting: Occupational Therapy

## 2023-03-01 DIAGNOSIS — I890 Lymphedema, not elsewhere classified: Secondary | ICD-10-CM | POA: Diagnosis not present

## 2023-03-01 NOTE — Therapy (Signed)
+- OUTPATIENT OCCUPATIONAL THERAPY TREATMENT NOTE   LOWER EXTREMITY LYMPHEDEMA Patient Name: Emily Wood MRN: [id number] DOB:21-Jan-1951, 72 y.o., female Today's Date: 03/01/2023  END OF SESSION:   OT End of Session - 03/01/23 0802     Visit Number 29    Number of Visits 36    Date for OT Re-Evaluation 04/20/23    OT Start Time 0801    OT Stop Time 0901    OT Time Calculation (min) 60 min    Activity Tolerance Patient tolerated treatment well;No increased pain   Eval limited by Pt arriving late   Behavior During Therapy [facility] for tasks assessed/performed               Past Medical History:  Diagnosis Date   Arthritis    Diabetes mellitus without complication (HCC)    Family history of adverse reaction to anesthesia    PONV   Graves disease 04/14/2020   Hypertension    Lymphedema    Mixed hyperlipidemia 02/20/2021   Past Surgical History:  Procedure Laterality Date   CATARACT EXTRACTION W/PHACO Right 04/14/2022   Procedure: CATARACT EXTRACTION PHACO AND INTRAOCULAR LENS PLACEMENT (IOC) RIGHT DIABETIC 10.93 00:53.0;  Surgeon: Galen Manila, MD;  Location: [facility];  Service: Ophthalmology;  Laterality: Right;   CATARACT EXTRACTION W/PHACO Left 04/28/2022   Procedure: CATARACT EXTRACTION PHACO AND INTRAOCULAR LENS PLACEMENT (IOC) LEFT DIABETIC 6.05 00:45.7;  Surgeon: Galen Manila, MD;  Location: [facility];  Service: Ophthalmology;  Laterality: Left;  Diabetic   CHOLECYSTECTOMY     HERNIA REPAIR     OTHER SURGICAL HISTORY     scar tissue removal from bowels   PARTIAL HYSTERECTOMY     Still has ovaries   REVERSE SHOULDER ARTHROPLASTY Right 09/11/2022   Procedure: REVERSE SHOULDER ARTHROPLASTY;  Surgeon: Yolonda Kida, MD;  Location: [facility];  Service: Orthopedics;  Laterality: Right;  120   UTERINE FIBROID SURGERY     Patient Active Problem List   Diagnosis Date Noted   Normocytic anemia 05/23/2022   AKI (acute kidney  injury) (HCC) 05/21/2022   Snoring 11/05/2021   Daytime somnolence 11/05/2021   Coronary artery disease involving native coronary artery of native heart without angina pectoris 05/15/2021   Nonrheumatic tricuspid valve regurgitation 05/15/2021   Pulmonary hypertension, unspecified (HCC) 05/15/2021   Mixed hyperlipidemia 02/20/2021   Obesity (BMI 30-39.9) 02/20/2021   Diabetic retinopathy (HCC) 08/28/2020   Mixed incontinence 05/31/2020   Graves disease 04/14/2020   Right renal mass 11/22/2019   Arthritis of left knee 09/25/2019   Lymphedema 01/25/2017   Fibroid uterus 01/14/2017   Chronic kidney disease, stage 3 (HCC) 09/21/2016   Meralgia paresthetica of left side 08/27/2015   Diabetes mellitus type 2, controlled (HCC) 08/02/2015   Chronic venous insufficiency 06/28/2015   Essential hypertension 02/15/2015   Peripheral edema 02/15/2015   Chronic knee pain 02/15/2015    PCP: Pearline Cables, MD  REFERRING PROVIDER: Sheppard Plumber, NP  REFERRING DIAG: I89.0  THERAPY DIAG:  Lymphedema, not elsewhere classified  Rationale for Evaluation and Treatment: Rehabilitation  ONSET DATE: insidious onset >30 years  SUBJECTIVE:  SUBJECTIVE STATEMENT: Emily Wood presents to Occupational Therapy for treatment of BLE lymphedema. She arrives with knee length, multilayer compression wraps in place on RLE and compression stocking in place on the L.  She is unaccompanied and walks to clinic. Pt denies LE-related leg pain, but continues with mild OA related knee pain. She has no new concerns.  PERTINENT HISTORY:  Medical Hx contributing to, or affected by chronic, progressive lymphedema: Norvasc prescription-common side effect is leg swelling; HTN, CVI, Obesity, CAD, OA knees, CKD stage 3, Graves Disease,  DM  PAIN:  Are you having pain? Yes: R arthritis knee pain;  1-2/10 Pain description: stiff, aching, throbbing, tight, heavy Aggravating factors: standing , walking,  Relieving factors: "The medication from my cardiologist", elevation, compression stockings ( Pt has good hip AROM bilaterally and is able to don them herself w extra time in clinic.)  PRECAUTIONS: Fall and Other: LYMPHEDEMA PRECAUTIONS: (CKD, CAD and DM, GRAVES )   WEIGHT BEARING RESTRICTIONS: No  FALLS:  Has patient fallen since last visit? NO    HAND DOMINANCE: right   PRIOR LEVEL OF FUNCTION: Requires assistive device for independence, Needs assistance with homemaking, and Needs assistance with gait  PATIENT GOALS: ".... to get this lymphedema under control. It's embarrassing. That's why I wear long skirts."   OBJECTIVE:   Mild, Stage  II, Bilateral Lower Extremity Lymphedema 2/2 CVI and Obesity  OBSERVATIONS / OTHER ASSESSMENTS:   Lymphedema Life Impact Scape (LLIS): Intake 11/03/22: 50% (The extent to which L:E-related problems affected your life during the past week)  FOTO functional outcome scale: Intake 11/16/22: 63%  BLE COMPARATIVE LIMB VOLUMETRICS:   Intake measurements completed 10/22/22  LANDMARK RIGHT   R LEG (A-D) 5043.4 ml  R THIGH (E-G) 4182.3 ml  R FULL LIMB (A-G) 7643.8 ml  Limb Volume differential (LVD)   Leg LVD =12.14%, L>R Thigh LVD 8.3%, L>R Full limb LVD= 8.3%   Volume change since initial %  Volume change overall V  (Blank rows = not tested)  LANDMARK LEFT    R LEG (A-D) 5656.0 ml  R THIGH (E-G) 4529.5 ml  R FULL LIMB (A-G) 10185.5 ml  Limb Volume differential (LVD)  %  Volume change since initial %  Volume change overall %  (Blank rows = not tested)   9th Visit: 12/07/22  [facility] RIGHT   R LEG (A-D) 5116.3 ml  R THIGH (E-G) 5047.6  ml  R FULL LIMB (A-G) 10163.9  ml  Limb Volume differential (LVD)    Volume change since initial R LEG volume INCREASED by 1.4%;  R  THIGH INCREASED by 20.7%, and R FULL LIMB INCREASED by 10.17% since initial on 10/22/22.  Volume change overall V  (Blank rows = not tested)       19 th visit 01/18/23  LANDMARK RIGHT   R LEG (A-D) 4606.5 ml  R THIGH (E-G) 4617.0 ml  R FULL LIMB (A-G) 9224 ml  Limb Volume differential (LVD)    Volume change since initial Since last measured on 9th Rx visit on 12/07/22, R LEG volume is DECREASED by 9.96 %, R THIGH volume is DECREASED by 8.5 %, and R FULL LIMB volume is DECREASED by 9.25 %   Volume change overall Since initially measured on 10/22/22 on 1st Rx visit,  R LEG volume is DECREASED by 8.67 % overall, R THIGH volume is INCREASED by 10 %, and R FULL LIMB volume is DECREASED by 0.018 %    29 th Visit 03/02/23  LANDMARK RIGHT   R LEG (A-D) 5749.7 ml  R THIGH (E-G)  ml  R FULL LIMB (A-G)  ml  Limb Volume differential (LVD)    Volume change since last Since 01/18/23 R leg  is INCREASED by 15.15%.  Volume change overall   (Blank rows = not tested)  LANDMARK LEFT    L LEG (A-D) Since initial 11/11/22 L LEG is INCREASED by 1.66%.   L THIGH (E-G)   L FULL LIMB (A-G)   Limb Volume differential (LVD)  %  Volume change since initial 1.66%  Volume change overall %     (Blank rows = not tested)  TODAY'S TREATMENT:  BLE comparative limb volumetrics in prep for progress report RLE knee length compression stocking LLE multilayer gradient compression wraps-knee high Pt edu for LE self-care and progress towards goals to date                  PATIENT EDUCATION:  Pt edu for LE self care:  Continued Pt/ CG edu for lymphedema self care home program throughout session. Emphasis today on lymphedema precautions and cellulitis ID, prevention and Rx. Discussed again fluid dynamics re elevation, and importance of elevation for lymphedema management and venous return. Other topics include outcome of comparative limb volumetrics- starting limb volume differentials (LVDs), technology and gradient  techniques used for short stretch, multilayer compression wrapping, simple self-MLD, therapeutic lymphatic pumping exercises, skin/nail care, LE precautions,. compression garment recommendations and specifications, wear and care schedule and compression garment donning / doffing w assistive devices. Discussed progress towards all OT goals since commencing CDT. All questions answered to the Pt's satisfaction. Good return. Person educated: Patient Education method: Explanation, Demonstration, Tactile cues, Verbal cues, and Handouts Education comprehension: verbalized understanding, returned demonstration, and needs further education  HOME EXERCISE PROGRAM: Intensive Phase Complete Decongestive Therapy (CDT) includes Daily Manual lymphatic drainage: Short neck sequence bilaterally w diaphragmatic breathing to engage deep abdominal lymphatics. Stationary J strokes to inguinal LN, then proximal to distal dynamic J strokes to thigh, "bottleneck" region, leg and foot. Finally 3 retrograde sweeps back to terminus. Daily Skin care w/ low ph lotion matching skin ph to limit infection risk and improve tissue flexibility and excursion Daily, BLE lymphatic pumping therex- seated or supine Daily (23/7) multilayer compression bandages below the knee to reduce limb volume, one limb at a time (One each 8 cm, 10 cm and 12 cm wide x 5 meters long short stretch compression wrap applied circumferentially using gradient layering techniques over single layer of cotton stockinett an Rosidal foam- toes to popliteal fossa  Self-management Phase CDT includes: All of the above and Appropriate daytime compression garments and HOS devices full time daily. Replace q 3-6 months Elvarex, flat knit, ccl 225-32 mmHg) knee highs w open toe, t heel, oblique top edge, 2.5 cm wide SB on top, tricot patch at anterior ankle sewn on all 4 sides. To be work full time waking hours. [employer]st RELAX knee high HOS device  Custom-made gradient  compression garments and HOS devices are medically necessary in this case because they are uniquely sized and shaped to fit the exact dimensions of the affected extremities with deformities, and to provide accurate and consistent gradient compression and containment, essential to optimally managing this patient's symptoms of chronic, progressive lymphedema. Multiple custom compression garments are needed for optimal hygiene to limit infection risk. Custom compression garments should be replaced q 3-6 months When worn consistently for optimal lipo-lymphedema self-management over time.   ASSESSMENT: CLINICAL  IMPRESSION: BLE comparative limb volumetrics reveal a  significant 15.15% increase in limb volume since last measured on 01/18/23. The LLE , the current Rx limb, is increased by 1.66% since initially measured in 4/24. Pt reports she thinks she has gained some weight, but volumetric increases due to systemic issues are typically symmetrical. Another possible issue may be that ccl 2 compression garments need to be increased to ccl 3 to contain RLE swelling, and possibly both limbs, despite RLE being worse initially. No MLD due to time constraints today. Reapplied LLE wraps. Progress report next session. Cont as per POC.  OBJECTIVE IMPAIRMENTS: low vision, Abnormal gait, decreased activity tolerance, decreased balance, decreased endurance, decreased knowledge of condition, decreased knowledge of use of DME, decreased mobility, decreased ROM, increased edema, impaired flexibility, postural dysfunction, obesity, and pain.   ACTIVITY LIMITATIONS: functional mobility and ambulation (carrying, lifting, bending, sitting, standing, squatting, sleeping, stairs, transfers, bed mobility, continence, Basic and instrumental ADLs (bathing, dressing, hygiene/grooming, Productive activities ( caring for others) and leisure pursuits, body image, and social participation  PERSONAL FACTORS:  multiple co morbidities, length of  time with progressive condition, are also affecting patient's functional outcome.   REHAB POTENTIAL: Fair    EVALUATION COMPLEXITY: Moderate   GOALS: Goals reviewed with patient? Yes  SHORT TERM GOALS: Target date: 4th OT Rx visit   Pt will demonstrate understanding of lymphedema precautions and prevention strategies with modified independence using a printed reference to identify at least 5 precautions and discussing how s/he may implement them into daily life to reduce risk of progression with extra time (Modified Independence). Baseline:Max A Goal status: 12/07/22 GOAL MET  2.  Pt will be able to apply multilayer, thigh length, gradient, compression wraps to one leg at a time with modified assistance (extra time) to decrease limb volume, to limit infection risk, and to limit lymphedema progression.  Baseline: Dependent Goal status: 11/16/22 PROGRESSING 11/26/22 GOAL MET  LONG TERM GOALS: Target date: 02/01/23  Given this patient's Intake score of 63/100% on the functional outcomes FOTO tool, patient will experience an increase in function of 3 points to improve basic and instrumental ADLs performance, including lymphedema self-care.  Baseline: ONGOING   Goal status:12/07/22 GOAL ONGOING,  01/20/23 PROGRESSING  2.  Given this patient's Intake score of 50.0 % on the Lymphedema Life Impact Scale (LLIS), patient will experience a reduction of at least 5 points in her perceived level of functional impairment resulting from lymphedema to improve functional performance and quality of life (QOL). Baseline: 50% Goal status: 12/07/22 GOAL ONGOING; 01/20/23 PROGRESSING  3.  During Intensive phase CDT Pt will achieve at least 85% compliance with all lymphedema self-care home program components, including daily skin care, compression wraps and /or garments, simple self MLD and lymphatic pumping therex, and elevation when sitting to habituate LE self care protocol  into ADLs for optimal LE self-management  over time. Baseline: Dependent Goal status: 12/08/22 ONGOING. Pt not changing wraps daily as directed, but will work on increasing elevation anytime she is seated for next progress report. Pt will also increase frequency of re-wrapping her leg to ensure optimal volume reduction. GOAL MET 01/20/23  4.  Pt will achieve at least a 10% volume reduction below the knees to return limb to typical size and shape, to limit infection risk and LE progression, to decrease pain, to improve function. Baseline: dependent Goal status: 12/07/22 PROGRESSING 01/18/23 : Leg reduction= 8.67% since initial. Thigh  is Increased 10% since initial, And full LE is  decreased by 0.018% since 11/11/22;  01/20/23 GOAL MET for L LEG w 9.96 % reduction . 03/01/23 BLE comparative limb volumetrics reveal a  significant 15.15% increase in limb volume since last measured on 01/18/23. The LLE , the current Rx limb, is increased by 1.66% since initially measured in 4/24.  See volumetric chart for additional details.  5.  Pt will obtain proper compression garments/compression braces and be modified independent with donning/doffing to optimize/stabilize with fluid reduction. Baseline:  Goal status: 12/07/22 PROGRESSING. 01/20/23 PROGRESSING.02/08/23 GOAL MET for RLE  PLAN:  PT FREQUENCY: 2x/week  PT DURATION: 12 weeks  PLANNED INTERVENTIONS: Therapeutic exercises, Therapeutic activity, Patient/Family education, Self Care, Manual lymph drainage, Compression bandaging, scar mobilization, Manual therapy, and skin care throughout MLD  PLAN FOR NEXT SESSION:  Review progress towards all goals to date. Add new goals PRN MLD and simultaneous skin care to LLE LLE multilayer compression wraps as established Cont Pt edu for LE self care   Loel Dubonnet, MS, OTR/L, CLT-LANA 03/01/23 11:40 AM`

## 2023-03-03 ENCOUNTER — Ambulatory Visit: Payer: Medicare Other | Admitting: Occupational Therapy

## 2023-03-03 DIAGNOSIS — I890 Lymphedema, not elsewhere classified: Secondary | ICD-10-CM

## 2023-03-03 NOTE — Therapy (Signed)
+- OUTPATIENT OCCUPATIONAL THERAPY TREATMENT NOTE AND PROGRESS REPORT  LOWER EXTREMITY LYMPHEDEMA Patient Name: Emily Wood MRN: [id number] DOB:1950-07-29, 72 y.o., female Today's Date: 03/03/2023  REPORTING PERIOD : 01/27/23 - 03/03/23  END OF SESSION:   OT End of Session - 03/03/23 0902     Visit Number 30    Number of Visits 36    Date for OT Re-Evaluation 04/20/23    OT Start Time 0803    OT Stop Time 0903    OT Time Calculation (min) 60 min    Activity Tolerance Patient tolerated treatment well;No increased pain   Eval limited by Pt arriving late   Behavior During Therapy [facility] for tasks assessed/performed               Past Medical History:  Diagnosis Date   Arthritis    Diabetes mellitus without complication (HCC)    Family history of adverse reaction to anesthesia    PONV   Graves disease 04/14/2020   Hypertension    Lymphedema    Mixed hyperlipidemia 02/20/2021   Past Surgical History:  Procedure Laterality Date   CATARACT EXTRACTION W/PHACO Right 04/14/2022   Procedure: CATARACT EXTRACTION PHACO AND INTRAOCULAR LENS PLACEMENT (IOC) RIGHT DIABETIC 10.93 00:53.0;  Surgeon: Galen Manila, MD;  Location: [facility];  Service: Ophthalmology;  Laterality: Right;   CATARACT EXTRACTION W/PHACO Left 04/28/2022   Procedure: CATARACT EXTRACTION PHACO AND INTRAOCULAR LENS PLACEMENT (IOC) LEFT DIABETIC 6.05 00:45.7;  Surgeon: Galen Manila, MD;  Location: [facility];  Service: Ophthalmology;  Laterality: Left;  Diabetic   CHOLECYSTECTOMY     HERNIA REPAIR     OTHER SURGICAL HISTORY     scar tissue removal from bowels   PARTIAL HYSTERECTOMY     Still has ovaries   REVERSE SHOULDER ARTHROPLASTY Right 09/11/2022   Procedure: REVERSE SHOULDER ARTHROPLASTY;  Surgeon: Yolonda Kida, MD;  Location: [facility];  Service: Orthopedics;  Laterality: Right;  120   UTERINE FIBROID SURGERY     Patient Active Problem List   Diagnosis Date Noted    Normocytic anemia 05/23/2022   AKI (acute kidney injury) (HCC) 05/21/2022   Snoring 11/05/2021   Daytime somnolence 11/05/2021   Coronary artery disease involving native coronary artery of native heart without angina pectoris 05/15/2021   Nonrheumatic tricuspid valve regurgitation 05/15/2021   Pulmonary hypertension, unspecified (HCC) 05/15/2021   Mixed hyperlipidemia 02/20/2021   Obesity (BMI 30-39.9) 02/20/2021   Diabetic retinopathy (HCC) 08/28/2020   Mixed incontinence 05/31/2020   Graves disease 04/14/2020   Right renal mass 11/22/2019   Arthritis of left knee 09/25/2019   Lymphedema 01/25/2017   Fibroid uterus 01/14/2017   Chronic kidney disease, stage 3 (HCC) 09/21/2016   Meralgia paresthetica of left side 08/27/2015   Diabetes mellitus type 2, controlled (HCC) 08/02/2015   Chronic venous insufficiency 06/28/2015   Essential hypertension 02/15/2015   Peripheral edema 02/15/2015   Chronic knee pain 02/15/2015    PCP: Pearline Cables, MD  REFERRING PROVIDER: Sheppard Plumber, NP  REFERRING DIAG: I89.0  THERAPY DIAG:  Lymphedema, not elsewhere classified  Rationale for Evaluation and Treatment: Rehabilitation  ONSET DATE: insidious onset >30 years  SUBJECTIVE:  SUBJECTIVE STATEMENT: Mrs Raboin presents to Occupational Therapy for treatment of BLE lymphedema. She arrives with knee length, multilayer compression wraps in place on RLE and compression stocking in place on the L.  She is unaccompanied and walks to clinic. Pt denies LE-related leg pain, but continues with mild OA related knee pain. She has no new concerns.  PERTINENT HISTORY:  Medical Hx contributing to, or affected by chronic, progressive lymphedema: Norvasc prescription-common side effect is leg swelling; HTN, CVI,  Obesity, CAD, OA knees, CKD stage 3, Graves Disease, DM  PAIN:  Are you having pain? Yes: R arthritis knee pain;  1-2/10 Pain description: stiff, aching, throbbing, tight, heavy Aggravating factors: standing , walking,  Relieving factors: "The medication from my cardiologist", elevation, compression stockings ( Pt has good hip AROM bilaterally and is able to don them herself w extra time in clinic.)  PRECAUTIONS: Fall and Other: LYMPHEDEMA PRECAUTIONS: (CKD, CAD and DM, GRAVES )   WEIGHT BEARING RESTRICTIONS: No  FALLS:  Has patient fallen since last visit? NO    HAND DOMINANCE: right   PRIOR LEVEL OF FUNCTION: Requires assistive device for independence, Needs assistance with homemaking, and Needs assistance with gait  PATIENT GOALS: ".... to get this lymphedema under control. It's embarrassing. That's why I wear long skirts."   OBJECTIVE:   Mild, Stage  II, Bilateral Lower Extremity Lymphedema 2/2 CVI and Obesity  OBSERVATIONS / OTHER ASSESSMENTS:   Lymphedema Life Impact Scape (LLIS): Intake 11/03/22: 50% (The extent to which L:E-related problems affected your life during the past week)  FOTO functional outcome scale: Intake 11/16/22: 63%  BLE COMPARATIVE LIMB VOLUMETRICS:   Intake measurements completed 10/22/22  LANDMARK RIGHT   R LEG (A-D) 5043.4 ml  R THIGH (E-G) 4182.3 ml  R FULL LIMB (A-G) 7643.8 ml  Limb Volume differential (LVD)   Leg LVD =12.14%, L>R Thigh LVD 8.3%, L>R Full limb LVD= 8.3%   Volume change since initial %  Volume change overall V  (Blank rows = not tested)  LANDMARK LEFT    R LEG (A-D) 5656.0 ml  R THIGH (E-G) 4529.5 ml  R FULL LIMB (A-G) 10185.5 ml  Limb Volume differential (LVD)  %  Volume change since initial %  Volume change overall %  (Blank rows = not tested)   9th Visit: 12/07/22  [facility] RIGHT   R LEG (A-D) 5116.3 ml  R THIGH (E-G) 5047.6  ml  R FULL LIMB (A-G) 10163.9  ml  Limb Volume differential (LVD)    Volume change  since initial R LEG volume INCREASED by 1.4%;  R THIGH INCREASED by 20.7%, and R FULL LIMB INCREASED by 10.17% since initial on 10/22/22.  Volume change overall V  (Blank rows = not tested)       19 th visit 01/18/23  LANDMARK RIGHT   R LEG (A-D) 4606.5 ml  R THIGH (E-G) 4617.0 ml  R FULL LIMB (A-G) 9224 ml  Limb Volume differential (LVD)    Volume change since initial Since last measured on 9th Rx visit on 12/07/22, R LEG volume is DECREASED by 9.96 %, R THIGH volume is DECREASED by 8.5 %, and R FULL LIMB volume is DECREASED by 9.25 %   Volume change overall Since initially measured on 10/22/22 on 1st Rx visit,  R LEG volume is DECREASED by 8.67 % overall, R THIGH volume is INCREASED by 10 %, and R FULL LIMB volume is DECREASED by 0.018 %    29 th Visit 03/02/23  LANDMARK RIGHT   R LEG (A-D) 5749.7 ml  R THIGH (E-G)  ml  R FULL LIMB (A-G)  ml  Limb Volume differential (LVD)    Volume change since last Since 01/18/23 R leg  is INCREASED by 15.15%.  Volume change overall   (Blank rows = not tested)  LANDMARK LEFT    L LEG (A-D) Since initial 11/11/22 L LEG is INCREASED by 1.66%.   L THIGH (E-G)   L FULL LIMB (A-G)   Limb Volume differential (LVD)  %  Volume change since initial 1.66%  Volume change overall %     (Blank rows = not tested)  TODAY'S TREATMENT:  BLE comparative limb volumetrics in prep for progress report RLE knee length compression stocking LLE multilayer gradient compression wraps-knee high Pt edu for LE self-care and progress towards goals to date                  PATIENT EDUCATION:  Pt edu for LE self care:  Continued Pt/ CG edu for lymphedema self care home program throughout session. Emphasis today on lymphedema precautions and cellulitis ID, prevention and Rx. Discussed again fluid dynamics re elevation, and importance of elevation for lymphedema management and venous return. Other topics include outcome of comparative limb volumetrics- starting limb volume  differentials (LVDs), technology and gradient techniques used for short stretch, multilayer compression wrapping, simple self-MLD, therapeutic lymphatic pumping exercises, skin/nail care, LE precautions,. compression garment recommendations and specifications, wear and care schedule and compression garment donning / doffing w assistive devices. Discussed progress towards all OT goals since commencing CDT. All questions answered to the Pt's satisfaction. Good return. Person educated: Patient Education method: Explanation, Demonstration, Tactile cues, Verbal cues, and Handouts Education comprehension: verbalized understanding, returned demonstration, and needs further education  HOME EXERCISE PROGRAM: Intensive Phase Complete Decongestive Therapy (CDT) includes Daily Manual lymphatic drainage: Short neck sequence bilaterally w diaphragmatic breathing to engage deep abdominal lymphatics. Stationary J strokes to inguinal LN, then proximal to distal dynamic J strokes to thigh, "bottleneck" region, leg and foot. Finally 3 retrograde sweeps back to terminus. Daily Skin care w/ low ph lotion matching skin ph to limit infection risk and improve tissue flexibility and excursion Daily, BLE lymphatic pumping therex- seated or supine Daily (23/7) multilayer compression bandages below the knee to reduce limb volume, one limb at a time (One each 8 cm, 10 cm and 12 cm wide x 5 meters long short stretch compression wrap applied circumferentially using gradient layering techniques over single layer of cotton stockinett an Rosidal foam- toes to popliteal fossa  Self-management Phase CDT includes: All of the above and Appropriate daytime compression garments and HOS devices full time daily. Replace q 3-6 months Elvarex, flat knit, ccl 225-32 mmHg) knee highs w open toe, t heel, oblique top edge, 2.5 cm wide SB on top, tricot patch at anterior ankle sewn on all 4 sides. To be work full time waking hours. Jobst RELAX knee  high HOS device  Custom-made gradient compression garments and HOS devices are medically necessary in this case because they are uniquely sized and shaped to fit the exact dimensions of the affected extremities with deformities, and to provide accurate and consistent gradient compression and containment, essential to optimally managing this patient's symptoms of chronic, progressive lymphedema. Multiple custom compression garments are needed for optimal hygiene to limit infection risk. Custom compression garments should be replaced q 3-6 months When worn consistently for optimal lipo-lymphedema self-management over time.   ASSESSMENT: CLINICAL  IMPRESSION: reviewed PROGRESS TOWARDS ALL GOALS FOR PROGRESS REPORT. BLE comparative limb volumetrics reveal a  significant 15.15% increase in RLE limb volume since last measured on 01/18/23. The LLE , the current Rx limb, is increased by 1.66% since initially measured in 4/24. Pt reports she thinks she has gained some weight, but volumetric increases due to systemic issues are typically symmetrical. Another possible issue may be that ccl 2 compression garment need to be increased to ccl 3 ( flat knit 34-40 mmHg) to contain RLE swelling, and possibly both limbs, despite RLE being worse initially.  Please refer to GOALS section for additional details re progress  to date. Reapplied LLE wraps. Progress report next session. Cont as per POC.  OBJECTIVE IMPAIRMENTS: low vision, Abnormal gait, decreased activity tolerance, decreased balance, decreased endurance, decreased knowledge of condition, decreased knowledge of use of DME, decreased mobility, decreased ROM, increased edema, impaired flexibility, postural dysfunction, obesity, and pain.   ACTIVITY LIMITATIONS: functional mobility and ambulation (carrying, lifting, bending, sitting, standing, squatting, sleeping, stairs, transfers, bed mobility, continence, Basic and instrumental ADLs (bathing, dressing,  hygiene/grooming, Productive activities ( caring for others) and leisure pursuits, body image, and social participation  PERSONAL FACTORS:  multiple co morbidities, length of time with progressive condition, are also affecting patient's functional outcome.   REHAB POTENTIAL: Fair    EVALUATION COMPLEXITY: Moderate   GOALS: Goals reviewed with patient? Yes  SHORT TERM GOALS: Target date: 4th OT Rx visit   Pt will demonstrate understanding of lymphedema precautions and prevention strategies with modified independence using a printed reference to identify at least 5 precautions and discussing how s/he may implement them into daily life to reduce risk of progression with extra time (Modified Independence). Baseline:Max A Goal status: 12/07/22 GOAL MET  2.  Pt will be able to apply multilayer, thigh length, gradient, compression wraps to one leg at a time with modified assistance (extra time) to decrease limb volume, to limit infection risk, and to limit lymphedema progression.  Baseline: Dependent Goal status: 11/16/22 PROGRESSING 11/26/22 GOAL MET  LONG TERM GOALS: Target date: 02/01/23  Given this patient's Intake score of 63/100% on the functional outcomes FOTO tool, patient will experience an increase in function of 3 points to improve basic and instrumental ADLs performance, including lymphedema self-care.  Baseline: ONGOING   Goal status:12/07/22 GOAL ONGOING,  01/20/23 PROGRESSING 03/03/23 PROGRESSING   2.  Given this patient's Intake score of 50.0 % on the Lymphedema Life Impact Scale (LLIS), patient will experience a reduction of at least 5 points in her perceived level of functional impairment resulting from lymphedema to improve functional performance and quality of life (QOL). Baseline: 50% Goal status: 12/07/22 GOAL ONGOING; 01/20/23 PROGRESSING,  03/03/23 PROGRESSING  3.  During Intensive phase CDT Pt will achieve at least 85% compliance with all lymphedema self-care home program  components, including daily skin care, compression wraps and /or garments, simple self MLD and lymphatic pumping therex, and elevation when sitting to habituate LE self care protocol  into ADLs for optimal LE self-management over time. Baseline: Dependent Goal status: 12/08/22 ONGOING. Pt not changing wraps daily as directed, but will work on increasing elevation anytime she is seated for next progress report. Pt will also increase frequency of re-wrapping her leg to ensure optimal volume reduction. GOAL MET 01/20/23  4.  Pt will achieve at least a 10% volume reduction below the knees to return limb to typical size and shape, to limit infection risk and LE progression, to decrease pain,  to improve function. Baseline: dependent Goal status: 12/07/22 PROGRESSING 01/18/23 : Leg reduction= 8.67% since initial. Thigh  is Increased 10% since initial, And full LE is decreased by 0.018% since 11/11/22;  01/20/23 GOAL MET for L LEG w 9.96 % reduction . 03/01/23 BLE comparative limb volumetrics reveal a  significant 15.15% increase in limb volume since last measured on 01/18/23. The LLE , the current Rx limb, is increased by 1.66% since initially measured in 4/24.  See volumetric chart for additional details.  5.  Pt will obtain proper compression garments/compression braces and be modified independent with donning/doffing to optimize/stabilize with fluid reduction. Baseline:  Goal status: 12/07/22 PROGRESSING. 01/20/23 PROGRESSING.02/08/23 GOAL MET for RLE  PLAN:  PT FREQUENCY: 2x/week  PT DURATION: 12 weeks  PLANNED INTERVENTIONS: Therapeutic exercises, Therapeutic activity, Patient/Family education, Self Care, Manual lymph drainage, Compression bandaging, scar mobilization, Manual therapy, and skin care throughout MLD  PLAN FOR NEXT SESSION:  MLD and simultaneous skin care to LLE LLE multilayer compression wraps as established Cont Pt edu for LE self care   Loel Dubonnet, MS, OTR/L, CLT-LANA 03/03/23 9:07  AM`

## 2023-03-08 ENCOUNTER — Ambulatory Visit: Payer: Medicare Other | Admitting: Occupational Therapy

## 2023-03-08 DIAGNOSIS — I890 Lymphedema, not elsewhere classified: Secondary | ICD-10-CM

## 2023-03-08 NOTE — Therapy (Signed)
[facility] [address] [place], [place], [address] Phone: [phone]   Fax:  [phone]  Patient Details  Name: Emily Wood MRN: [id number] Date of Birth: 21-Jun-1951 Referring Provider:  Pearline Cables, MD  Encounter Date: 03/08/2023  Loel Dubonnet, MS, OTR/L, [facility] 03/08/23 9:09 AM   [facility] [address] [place], [place], [address] Phone: [phone]   Fax:  [phone]

## 2023-03-08 NOTE — Therapy (Signed)
+- OUTPATIENT OCCUPATIONAL THERAPY TREATMENT NOTE AND PROGRESS REPORT  LOWER EXTREMITY LYMPHEDEMA Patient Name: LENNYN MIDDAUGH MRN: [id number] DOB:1950-10-27, 72 y.o., female Today's Date: 03/08/2023  REPORTING PERIOD : 01/27/23 - 03/03/23  END OF SESSION:   OT End of Session - 03/08/23 0803     Visit Number 31    Number of Visits 36    Date for OT Re-Evaluation 04/20/23    OT Start Time 0803    OT Stop Time 0905    OT Time Calculation (min) 62 min    Activity Tolerance Patient tolerated treatment well;No increased pain   Eval limited by Pt arriving late   Behavior During Therapy [facility] for tasks assessed/performed               Past Medical History:  Diagnosis Date   Arthritis    Diabetes mellitus without complication (HCC)    Family history of adverse reaction to anesthesia    PONV   Graves disease 04/14/2020   Hypertension    Lymphedema    Mixed hyperlipidemia 02/20/2021   Past Surgical History:  Procedure Laterality Date   CATARACT EXTRACTION W/PHACO Right 04/14/2022   Procedure: CATARACT EXTRACTION PHACO AND INTRAOCULAR LENS PLACEMENT (IOC) RIGHT DIABETIC 10.93 00:53.0;  Surgeon: Galen Manila, MD;  Location: [facility];  Service: Ophthalmology;  Laterality: Right;   CATARACT EXTRACTION W/PHACO Left 04/28/2022   Procedure: CATARACT EXTRACTION PHACO AND INTRAOCULAR LENS PLACEMENT (IOC) LEFT DIABETIC 6.05 00:45.7;  Surgeon: Galen Manila, MD;  Location: [facility];  Service: Ophthalmology;  Laterality: Left;  Diabetic   CHOLECYSTECTOMY     HERNIA REPAIR     OTHER SURGICAL HISTORY     scar tissue removal from bowels   PARTIAL HYSTERECTOMY     Still has ovaries   REVERSE SHOULDER ARTHROPLASTY Right 09/11/2022   Procedure: REVERSE SHOULDER ARTHROPLASTY;  Surgeon: Yolonda Kida, MD;  Location: [facility];  Service: Orthopedics;  Laterality: Right;  120   UTERINE FIBROID SURGERY     Patient Active Problem List   Diagnosis Date Noted    Normocytic anemia 05/23/2022   AKI (acute kidney injury) (HCC) 05/21/2022   Snoring 11/05/2021   Daytime somnolence 11/05/2021   Coronary artery disease involving native coronary artery of native heart without angina pectoris 05/15/2021   Nonrheumatic tricuspid valve regurgitation 05/15/2021   Pulmonary hypertension, unspecified (HCC) 05/15/2021   Mixed hyperlipidemia 02/20/2021   Obesity (BMI 30-39.9) 02/20/2021   Diabetic retinopathy (HCC) 08/28/2020   Mixed incontinence 05/31/2020   Graves disease 04/14/2020   Right renal mass 11/22/2019   Arthritis of left knee 09/25/2019   Lymphedema 01/25/2017   Fibroid uterus 01/14/2017   Chronic kidney disease, stage 3 (HCC) 09/21/2016   Meralgia paresthetica of left side 08/27/2015   Diabetes mellitus type 2, controlled (HCC) 08/02/2015   Chronic venous insufficiency 06/28/2015   Essential hypertension 02/15/2015   Peripheral edema 02/15/2015   Chronic knee pain 02/15/2015    PCP: Pearline Cables, MD  REFERRING PROVIDER: Sheppard Plumber, NP  REFERRING DIAG: I89.0  THERAPY DIAG:  Lymphedema, not elsewhere classified  Rationale for Evaluation and Treatment: Rehabilitation  ONSET DATE: insidious onset >30 years  SUBJECTIVE:  SUBJECTIVE STATEMENT: Mrs Rudden presents to Occupational Therapy for treatment of BLE lymphedema. She arrives with knee length, multilayer compression wraps in place on RLE and compression stocking in place on the L.  She is unaccompanied and walks to clinic. Pt denies LE-related leg pain, but continues with mild OA related knee pain. She has no new concerns.  PERTINENT HISTORY:  Medical Hx contributing to, or affected by chronic, progressive lymphedema: Norvasc prescription-common side effect is leg swelling; HTN, CVI,  Obesity, CAD, OA knees, CKD stage 3, Graves Disease, DM  PAIN:  Are you having pain? Yes: R arthritis knee pain;  1-2/10 Pain description: stiff, aching, throbbing, tight, heavy Aggravating factors: standing , walking,  Relieving factors: "The medication from my cardiologist", elevation, compression stockings ( Pt has good hip AROM bilaterally and is able to don them herself w extra time in clinic.)  PRECAUTIONS: Fall and Other: LYMPHEDEMA PRECAUTIONS: (CKD, CAD and DM, GRAVES )   WEIGHT BEARING RESTRICTIONS: No  FALLS:  Has patient fallen since last visit? NO    HAND DOMINANCE: right   PRIOR LEVEL OF FUNCTION: Requires assistive device for independence, Needs assistance with homemaking, and Needs assistance with gait  PATIENT GOALS: ".... to get this lymphedema under control. It's embarrassing. That's why I wear long skirts."   OBJECTIVE:   Mild, Stage  II, Bilateral Lower Extremity Lymphedema 2/2 CVI and Obesity  OBSERVATIONS / OTHER ASSESSMENTS:   Lymphedema Life Impact Scape (LLIS): Intake 11/03/22: 50% (The extent to which L:E-related problems affected your life during the past week)  FOTO functional outcome scale: Intake 11/16/22: 63%  BLE COMPARATIVE LIMB VOLUMETRICS:   Intake measurements completed 10/22/22  LANDMARK RIGHT   R LEG (A-D) 5043.4 ml  R THIGH (E-G) 4182.3 ml  R FULL LIMB (A-G) 7643.8 ml  Limb Volume differential (LVD)   Leg LVD =12.14%, L>R Thigh LVD 8.3%, L>R Full limb LVD= 8.3%   Volume change since initial %  Volume change overall V  (Blank rows = not tested)  LANDMARK LEFT    R LEG (A-D) 5656.0 ml  R THIGH (E-G) 4529.5 ml  R FULL LIMB (A-G) 10185.5 ml  Limb Volume differential (LVD)  %  Volume change since initial %  Volume change overall %  (Blank rows = not tested)   9th Visit: 12/07/22  [facility] RIGHT   R LEG (A-D) 5116.3 ml  R THIGH (E-G) 5047.6  ml  R FULL LIMB (A-G) 10163.9  ml  Limb Volume differential (LVD)    Volume change  since initial R LEG volume INCREASED by 1.4%;  R THIGH INCREASED by 20.7%, and R FULL LIMB INCREASED by 10.17% since initial on 10/22/22.  Volume change overall V  (Blank rows = not tested)       19 th visit 01/18/23  LANDMARK RIGHT   R LEG (A-D) 4606.5 ml  R THIGH (E-G) 4617.0 ml  R FULL LIMB (A-G) 9224 ml  Limb Volume differential (LVD)    Volume change since initial Since last measured on 9th Rx visit on 12/07/22, R LEG volume is DECREASED by 9.96 %, R THIGH volume is DECREASED by 8.5 %, and R FULL LIMB volume is DECREASED by 9.25 %   Volume change overall Since initially measured on 10/22/22 on 1st Rx visit,  R LEG volume is DECREASED by 8.67 % overall, R THIGH volume is INCREASED by 10 %, and R FULL LIMB volume is DECREASED by 0.018 %    29 th Visit 03/02/23  LANDMARK RIGHT   R LEG (A-D) 5749.7 ml  R THIGH (E-G)  ml  R FULL LIMB (A-G)  ml  Limb Volume differential (LVD)    Volume change since last Since 01/18/23 R leg  is INCREASED by 15.15%.  Volume change overall   (Blank rows = not tested)  LANDMARK LEFT    L LEG (A-D) Since initial 11/11/22 L LEG is INCREASED by 1.66%.   L THIGH (E-G)   L FULL LIMB (A-G)   Limb Volume differential (LVD)    Volume change since initial 1.66%  Volume change overall      (Blank rows = not tested)  TODAY'S TREATMENT:  LLE/LLQ MLD as established LLE multilayer gradient compression wraps-knee high Pt edu for LE self-care and progress towards goals to date                  PATIENT EDUCATION:  Pt edu for LE self care:  Continued Pt/ CG edu for lymphedema self care home program throughout session. Emphasis today on lymphedema precautions and cellulitis ID, prevention and Rx. Discussed again fluid dynamics re elevation, and importance of elevation for lymphedema management and venous return. Other topics include outcome of comparative limb volumetrics- starting limb volume differentials (LVDs), technology and gradient techniques used for short stretch,  multilayer compression wrapping, simple self-MLD, therapeutic lymphatic pumping exercises, skin/nail care, LE precautions,. compression garment recommendations and specifications, wear and care schedule and compression garment donning / doffing w assistive devices. Discussed progress towards all OT goals since commencing CDT. All questions answered to the Pt's satisfaction. Good return. Person educated: Patient Education method: Explanation, Demonstration, Tactile cues, Verbal cues, and Handouts Education comprehension: verbalized understanding, returned demonstration, and needs further education  HOME EXERCISE PROGRAM: Intensive Phase Complete Decongestive Therapy (CDT) includes Daily Manual lymphatic drainage: Short neck sequence bilaterally w diaphragmatic breathing to engage deep abdominal lymphatics. Stationary J strokes to inguinal LN, then proximal to distal dynamic J strokes to thigh, "bottleneck" region, leg and foot. Finally 3 retrograde sweeps back to terminus. Daily Skin care w/ low ph lotion matching skin ph to limit infection risk and improve tissue flexibility and excursion Daily, BLE lymphatic pumping therex- seated or supine Daily (23/7) multilayer compression bandages below the knee to reduce limb volume, one limb at a time (One each 8 cm, 10 cm and 12 cm wide x 5 meters long short stretch compression wrap applied circumferentially using gradient layering techniques over single layer of cotton stockinett an Rosidal foam- toes to popliteal fossa  Self-management Phase CDT includes: All of the above and Appropriate daytime compression garments and HOS devices full time daily. Replace q 3-6 months Elvarex, flat knit, ccl 225-32 mmHg) knee highs w open toe, t heel, oblique top edge, 2.5 cm wide SB on top, tricot patch at anterior ankle sewn on all 4 sides. To be work full time waking hours. [employer]st RELAX knee high HOS device  Custom-made gradient compression garments and HOS devices are  medically necessary in this case because they are uniquely sized and shaped to fit the exact dimensions of the affected extremities with deformities, and to provide accurate and consistent gradient compression and containment, essential to optimally managing this patient's symptoms of chronic, progressive lymphedema. Multiple custom compression garments are needed for optimal hygiene to limit infection risk. Custom compression garments should be replaced q 3-6 months When worn consistently for optimal lipo-lymphedema self-management over time.   ASSESSMENT: CLINICAL IMPRESSION: LLE skin condition shows improvement with reduction in hyperkeratosis  at distal; leg. Volume reduction and deep fibrosis are slow to decrease at distal leg and ankle. LLE appears more swollen overall today from toes to groin than at last session. Pt attributes this to salty diet over the weekend. Pt tolerated MLD  with simultaneous skin care w emphasis on leg and ankle. Manual therapy and compression wraps well tolerated without increased pain. Cont as per POC.    OBJECTIVE IMPAIRMENTS: low vision, Abnormal gait, decreased activity tolerance, decreased balance, decreased endurance, decreased knowledge of condition, decreased knowledge of use of DME, decreased mobility, decreased ROM, increased edema, impaired flexibility, postural dysfunction, obesity, and pain.   ACTIVITY LIMITATIONS: functional mobility and ambulation (carrying, lifting, bending, sitting, standing, squatting, sleeping, stairs, transfers, bed mobility, continence, Basic and instrumental ADLs (bathing, dressing, hygiene/grooming, Productive activities ( caring for others) and leisure pursuits, body image, and social participation  PERSONAL FACTORS:  multiple co morbidities, length of time with progressive condition, are also affecting patient's functional outcome.   REHAB POTENTIAL: Fair    EVALUATION COMPLEXITY: Moderate   GOALS: Goals reviewed with  patient? Yes  SHORT TERM GOALS: Target date: 4th OT Rx visit   Pt will demonstrate understanding of lymphedema precautions and prevention strategies with modified independence using a printed reference to identify at least 5 precautions and discussing how s/he may implement them into daily life to reduce risk of progression with extra time (Modified Independence). Baseline:Max A Goal status: 12/07/22 GOAL MET  2.  Pt will be able to apply multilayer, thigh length, gradient, compression wraps to one leg at a time with modified assistance (extra time) to decrease limb volume, to limit infection risk, and to limit lymphedema progression.  Baseline: Dependent Goal status: 11/16/22 PROGRESSING 11/26/22 GOAL MET  LONG TERM GOALS: Target date: 02/01/23  Given this patient's Intake score of 63/100% on the functional outcomes FOTO tool, patient will experience an increase in function of 3 points to improve basic and instrumental ADLs performance, including lymphedema self-care.  Baseline: ONGOING   Goal status:12/07/22 GOAL ONGOING,  01/20/23 PROGRESSING 03/03/23 PROGRESSING   2.  Given this patient's Intake score of 50.0 % on the Lymphedema Life Impact Scale (LLIS), patient will experience a reduction of at least 5 points in her perceived level of functional impairment resulting from lymphedema to improve functional performance and quality of life (QOL). Baseline: 50% Goal status: 12/07/22 GOAL ONGOING; 01/20/23 PROGRESSING,  03/03/23 PROGRESSING  3.  During Intensive phase CDT Pt will achieve at least 85% compliance with all lymphedema self-care home program components, including daily skin care, compression wraps and /or garments, simple self MLD and lymphatic pumping therex, and elevation when sitting to habituate LE self care protocol  into ADLs for optimal LE self-management over time. Baseline: Dependent Goal status: 12/08/22 ONGOING. Pt not changing wraps daily as directed, but will work on increasing  elevation anytime she is seated for next progress report. Pt will also increase frequency of re-wrapping her leg to ensure optimal volume reduction. GOAL MET 01/20/23  4.  Pt will achieve at least a 10% volume reduction below the knees to return limb to typical size and shape, to limit infection risk and LE progression, to decrease pain, to improve function. Baseline: dependent Goal status: 12/07/22 PROGRESSING 01/18/23 : Leg reduction= 8.67% since initial. Thigh  is Increased 10% since initial, And full LE is decreased by 0.018% since 11/11/22;  01/20/23 GOAL MET for L LEG w 9.96 % reduction . 03/01/23 BLE comparative limb volumetrics reveal a  significant  15.15% increase in limb volume since last measured on 01/18/23. The LLE , the current Rx limb, is increased by 1.66% since initially measured in 4/24.  See volumetric chart for additional details.  5.  Pt will obtain proper compression garments/compression braces and be modified independent with donning/doffing to optimize/stabilize with fluid reduction. Baseline:  Goal status: 12/07/22 PROGRESSING. 01/20/23 PROGRESSING.02/08/23 GOAL MET for RLE  PLAN:  PT FREQUENCY: 2x/week  PT DURATION: 12 weeks  PLANNED INTERVENTIONS: Therapeutic exercises, Therapeutic activity, Patient/Family education, Self Care, Manual lymph drainage, Compression bandaging, scar mobilization, Manual therapy, and skin care throughout MLD  PLAN FOR NEXT SESSION:  MLD and simultaneous skin care to LLE LLE multilayer compression wraps as established Cont Pt edu for LE self care   Loel Dubonnet, MS, OTR/L, CLT-LANA 03/08/23 9:11 AM`

## 2023-03-10 ENCOUNTER — Ambulatory Visit: Payer: Medicare Other | Admitting: Occupational Therapy

## 2023-03-10 ENCOUNTER — Encounter: Payer: Self-pay | Admitting: Occupational Therapy

## 2023-03-10 DIAGNOSIS — I890 Lymphedema, not elsewhere classified: Secondary | ICD-10-CM | POA: Diagnosis not present

## 2023-03-10 NOTE — Therapy (Signed)
+- OUTPATIENT OCCUPATIONAL THERAPY TREATMENT NOTE  LOWER EXTREMITY LYMPHEDEMA Patient Name: Emily Wood MRN: [id number] DOB:1950/12/05, 72 y.o., female Today's Date: 03/10/2023  END OF SESSION:   OT End of Session - 03/10/23 0808     Visit Number 32    Number of Visits 36    Date for OT Re-Evaluation 04/20/23    OT Start Time 0803    OT Stop Time 0903    OT Time Calculation (min) 60 min    Activity Tolerance Patient tolerated treatment well;No increased pain   Eval limited by Pt arriving late   Behavior During Therapy [facility] for tasks assessed/performed               Past Medical History:  Diagnosis Date   Arthritis    Diabetes mellitus without complication (HCC)    Family history of adverse reaction to anesthesia    PONV   Graves disease 04/14/2020   Hypertension    Lymphedema    Mixed hyperlipidemia 02/20/2021   Past Surgical History:  Procedure Laterality Date   CATARACT EXTRACTION W/PHACO Right 04/14/2022   Procedure: CATARACT EXTRACTION PHACO AND INTRAOCULAR LENS PLACEMENT (IOC) RIGHT DIABETIC 10.93 00:53.0;  Surgeon: Galen Manila, MD;  Location: [facility];  Service: Ophthalmology;  Laterality: Right;   CATARACT EXTRACTION W/PHACO Left 04/28/2022   Procedure: CATARACT EXTRACTION PHACO AND INTRAOCULAR LENS PLACEMENT (IOC) LEFT DIABETIC 6.05 00:45.7;  Surgeon: Galen Manila, MD;  Location: [facility];  Service: Ophthalmology;  Laterality: Left;  Diabetic   CHOLECYSTECTOMY     HERNIA REPAIR     OTHER SURGICAL HISTORY     scar tissue removal from bowels   PARTIAL HYSTERECTOMY     Still has ovaries   REVERSE SHOULDER ARTHROPLASTY Right 09/11/2022   Procedure: REVERSE SHOULDER ARTHROPLASTY;  Surgeon: Yolonda Kida, MD;  Location: [facility];  Service: Orthopedics;  Laterality: Right;  120   UTERINE FIBROID SURGERY     Patient Active Problem List   Diagnosis Date Noted   Normocytic anemia 05/23/2022   AKI (acute kidney injury)  (HCC) 05/21/2022   Snoring 11/05/2021   Daytime somnolence 11/05/2021   Coronary artery disease involving native coronary artery of native heart without angina pectoris 05/15/2021   Nonrheumatic tricuspid valve regurgitation 05/15/2021   Pulmonary hypertension, unspecified (HCC) 05/15/2021   Mixed hyperlipidemia 02/20/2021   Obesity (BMI 30-39.9) 02/20/2021   Diabetic retinopathy (HCC) 08/28/2020   Mixed incontinence 05/31/2020   Graves disease 04/14/2020   Right renal mass 11/22/2019   Arthritis of left knee 09/25/2019   Lymphedema 01/25/2017   Fibroid uterus 01/14/2017   Chronic kidney disease, stage 3 (HCC) 09/21/2016   Meralgia paresthetica of left side 08/27/2015   Diabetes mellitus type 2, controlled (HCC) 08/02/2015   Chronic venous insufficiency 06/28/2015   Essential hypertension 02/15/2015   Peripheral edema 02/15/2015   Chronic knee pain 02/15/2015    PCP: Pearline Cables, MD  REFERRING PROVIDER: Sheppard Plumber, NP  REFERRING DIAG: I89.0  THERAPY DIAG:  Lymphedema, not elsewhere classified  Rationale for Evaluation and Treatment: Rehabilitation  ONSET DATE: insidious onset >30 years  SUBJECTIVE:  SUBJECTIVE STATEMENT: Emily Wood presents to Occupational Therapy for treatment of BLE lymphedema. She arrives with knee length, multilayer compression wraps in place on RLE and compression stocking in place on the L.  She is unaccompanied and walks to clinic. Pt denies LE-related leg pain, but continues with mild OA related knee pain. She has no new concerns.  PERTINENT HISTORY:  Medical Hx contributing to, or affected by chronic, progressive lymphedema: Norvasc prescription-common side effect is leg swelling; HTN, CVI, Obesity, CAD, OA knees, CKD stage 3, Graves Disease, DM  PAIN:   Are you having pain? Yes: R arthritis knee pain;  1-2/10 Pain description: stiff, aching, throbbing, tight, heavy Aggravating factors: standing , walking,  Relieving factors: "The medication from my cardiologist", elevation, compression stockings ( Pt has good hip AROM bilaterally and is able to don them herself w extra time in clinic.)  PRECAUTIONS: Fall and Other: LYMPHEDEMA PRECAUTIONS: (CKD, CAD and DM, GRAVES )   WEIGHT BEARING RESTRICTIONS: No  FALLS:  Has patient fallen since last visit? NO    HAND DOMINANCE: right   PRIOR LEVEL OF FUNCTION: Requires assistive device for independence, Needs assistance with homemaking, and Needs assistance with gait  PATIENT GOALS: ".... to get this lymphedema under control. It's embarrassing. That's why I wear long skirts."   OBJECTIVE:   Mild, Stage  II, Bilateral Lower Extremity Lymphedema 2/2 CVI and Obesity  OBSERVATIONS / OTHER ASSESSMENTS:   Lymphedema Life Impact Scape (LLIS): Intake 11/03/22: 50% (The extent to which L:E-related problems affected your life during the past week)  FOTO functional outcome scale: Intake 11/16/22: 63%  BLE COMPARATIVE LIMB VOLUMETRICS:   Intake measurements completed 10/22/22  LANDMARK RIGHT   R LEG (A-D) 5043.4 ml  R THIGH (E-G) 4182.3 ml  R FULL LIMB (A-G) 7643.8 ml  Limb Volume differential (LVD)   Leg LVD =12.14%, L>R Thigh LVD 8.3%, L>R Full limb LVD= 8.3%   Volume change since initial %  Volume change overall V  (Blank rows = not tested)  LANDMARK LEFT    R LEG (A-D) 5656.0 ml  R THIGH (E-G) 4529.5 ml  R FULL LIMB (A-G) 10185.5 ml  Limb Volume differential (LVD)  %  Volume change since initial %  Volume change overall %  (Blank rows = not tested)   9th Visit: 12/07/22  [facility] RIGHT   R LEG (A-D) 5116.3 ml  R THIGH (E-G) 5047.6  ml  R FULL LIMB (A-G) 10163.9  ml  Limb Volume differential (LVD)    Volume change since initial R LEG volume INCREASED by 1.4%;  R THIGH INCREASED  by 20.7%, and R FULL LIMB INCREASED by 10.17% since initial on 10/22/22.  Volume change overall V  (Blank rows = not tested)       19 th visit 01/18/23  LANDMARK RIGHT   R LEG (A-D) 4606.5 ml  R THIGH (E-G) 4617.0 ml  R FULL LIMB (A-G) 9224 ml  Limb Volume differential (LVD)    Volume change since initial Since last measured on 9th Rx visit on 12/07/22, R LEG volume is DECREASED by 9.96 %, R THIGH volume is DECREASED by 8.5 %, and R FULL LIMB volume is DECREASED by 9.25 %   Volume change overall Since initially measured on 10/22/22 on 1st Rx visit,  R LEG volume is DECREASED by 8.67 % overall, R THIGH volume is INCREASED by 10 %, and R FULL LIMB volume is DECREASED by 0.018 %    29 th Visit 03/02/23  LANDMARK RIGHT   R LEG (A-D) 5749.7 ml  R THIGH (E-G)  ml  R FULL LIMB (A-G)  ml  Limb Volume differential (LVD)    Volume change since last Since 01/18/23 R leg  is INCREASED by 15.15%.  Volume change overall   (Blank rows = not tested)  LANDMARK LEFT    L LEG (A-D) Since initial 11/11/22 L LEG is INCREASED by 1.66%.   L THIGH (E-G)   L FULL LIMB (A-G)   Limb Volume differential (LVD)    Volume change since initial 1.66%  Volume change overall      (Blank rows = not tested)  TODAY'S TREATMENT:  LLE/LLQ MLD as established LLE multilayer gradient compression wraps-knee high Pt edu for LE self-care and progress towards goals to date                  PATIENT EDUCATION:  Pt edu for LE self care:  Continued Pt/ CG edu for lymphedema self care home program throughout session. Emphasis today on lymphedema precautions and cellulitis ID, prevention and Rx. Discussed again fluid dynamics re elevation, and importance of elevation for lymphedema management and venous return. Other topics include outcome of comparative limb volumetrics- starting limb volume differentials (LVDs), technology and gradient techniques used for short stretch, multilayer compression wrapping, simple self-MLD, therapeutic  lymphatic pumping exercises, skin/nail care, LE precautions,. compression garment recommendations and specifications, wear and care schedule and compression garment donning / doffing w assistive devices. Discussed progress towards all OT goals since commencing CDT. All questions answered to the Pt's satisfaction. Good return. Person educated: Patient Education method: Explanation, Demonstration, Tactile cues, Verbal cues, and Handouts Education comprehension: verbalized understanding, returned demonstration, and needs further education  HOME EXERCISE PROGRAM: Intensive Phase Complete Decongestive Therapy (CDT) includes Daily Manual lymphatic drainage: Short neck sequence bilaterally w diaphragmatic breathing to engage deep abdominal lymphatics. Stationary J strokes to inguinal LN, then proximal to distal dynamic J strokes to thigh, "bottleneck" region, leg and foot. Finally 3 retrograde sweeps back to terminus. Daily Skin care w/ low ph lotion matching skin ph to limit infection risk and improve tissue flexibility and excursion Daily, BLE lymphatic pumping therex- seated or supine Daily (23/7) multilayer compression bandages below the knee to reduce limb volume, one limb at a time (One each 8 cm, 10 cm and 12 cm wide x 5 meters long short stretch compression wrap applied circumferentially using gradient layering techniques over single layer of cotton stockinett an Rosidal foam- toes to popliteal fossa  Self-management Phase CDT includes: All of the above and Appropriate daytime compression garments and HOS devices full time daily. Replace q 3-6 months Elvarex, flat knit, ccl 225-32 mmHg) knee highs w open toe, t heel, oblique top edge, 2.5 cm wide SB on top, tricot patch at anterior ankle sewn on all 4 sides. To be work full time waking hours. [employer]st RELAX knee high HOS device  Custom-made gradient compression garments and HOS devices are medically necessary in this case because they are uniquely  sized and shaped to fit the exact dimensions of the affected extremities with deformities, and to provide accurate and consistent gradient compression and containment, essential to optimally managing this patient's symptoms of chronic, progressive lymphedema. Multiple custom compression garments are needed for optimal hygiene to limit infection risk. Custom compression garments should be replaced q 3-6 months When worn consistently for optimal lipo-lymphedema self-management over time.   ASSESSMENT: CLINICAL IMPRESSION: LLE skin condition shows improvement with reduction in hyperkeratosis  at distal; leg. Volume reduction and deep fibrosis are slow to decrease at distal leg and ankle. LLE swelling is reduced slightly today after several consecutive days in wraps. Pt tolerated MLD  with simultaneous skin care w emphasis on leg and ankle. Manual therapy and compression wraps well tolerated without increased pain. Cont as per POC.    OBJECTIVE IMPAIRMENTS: low vision, Abnormal gait, decreased activity tolerance, decreased balance, decreased endurance, decreased knowledge of condition, decreased knowledge of use of DME, decreased mobility, decreased ROM, increased edema, impaired flexibility, postural dysfunction, obesity, and pain.   ACTIVITY LIMITATIONS: functional mobility and ambulation (carrying, lifting, bending, sitting, standing, squatting, sleeping, stairs, transfers, bed mobility, continence, Basic and instrumental ADLs (bathing, dressing, hygiene/grooming, Productive activities ( caring for others) and leisure pursuits, body image, and social participation  PERSONAL FACTORS:  multiple co morbidities, length of time with progressive condition, are also affecting patient's functional outcome.   REHAB POTENTIAL: Fair    EVALUATION COMPLEXITY: Moderate   GOALS: Goals reviewed with patient? Yes  SHORT TERM GOALS: Target date: 4th OT Rx visit   Pt will demonstrate understanding of lymphedema  precautions and prevention strategies with modified independence using a printed reference to identify at least 5 precautions and discussing how s/he may implement them into daily life to reduce risk of progression with extra time (Modified Independence). Baseline:Max A Goal status: 12/07/22 GOAL MET  2.  Pt will be able to apply multilayer, thigh length, gradient, compression wraps to one leg at a time with modified assistance (extra time) to decrease limb volume, to limit infection risk, and to limit lymphedema progression.  Baseline: Dependent Goal status: 11/16/22 PROGRESSING 11/26/22 GOAL MET  LONG TERM GOALS: Target date: 02/01/23  Given this patient's Intake score of 63/100% on the functional outcomes FOTO tool, patient will experience an increase in function of 3 points to improve basic and instrumental ADLs performance, including lymphedema self-care.  Baseline: ONGOING   Goal status:12/07/22 GOAL ONGOING,  01/20/23 PROGRESSING 03/03/23 PROGRESSING   2.  Given this patient's Intake score of 50.0 % on the Lymphedema Life Impact Scale (LLIS), patient will experience a reduction of at least 5 points in her perceived level of functional impairment resulting from lymphedema to improve functional performance and quality of life (QOL). Baseline: 50% Goal status: 12/07/22 GOAL ONGOING; 01/20/23 PROGRESSING,  03/03/23 PROGRESSING  3.  During Intensive phase CDT Pt will achieve at least 85% compliance with all lymphedema self-care home program components, including daily skin care, compression wraps and /or garments, simple self MLD and lymphatic pumping therex, and elevation when sitting to habituate LE self care protocol  into ADLs for optimal LE self-management over time. Baseline: Dependent Goal status: 12/08/22 ONGOING. Pt not changing wraps daily as directed, but will work on increasing elevation anytime she is seated for next progress report. Pt will also increase frequency of re-wrapping her leg to  ensure optimal volume reduction. GOAL MET 01/20/23  4.  Pt will achieve at least a 10% volume reduction below the knees to return limb to typical size and shape, to limit infection risk and LE progression, to decrease pain, to improve function. Baseline: dependent Goal status: 12/07/22 PROGRESSING 01/18/23 : Leg reduction= 8.67% since initial. Thigh  is Increased 10% since initial, And full LE is decreased by 0.018% since 11/11/22;  01/20/23 GOAL MET for L LEG w 9.96 % reduction . 03/01/23 BLE comparative limb volumetrics reveal a  significant 15.15% increase in limb volume since last measured on 01/18/23. The  LLE , the current Rx limb, is increased by 1.66% since initially measured in 4/24.  See volumetric chart for additional details.  5.  Pt will obtain proper compression garments/compression braces and be modified independent with donning/doffing to optimize/stabilize with fluid reduction. Baseline:  Goal status: 12/07/22 PROGRESSING. 01/20/23 PROGRESSING.02/08/23 GOAL MET for RLE  PLAN:  PT FREQUENCY: 2x/week  PT DURATION: 12 weeks  PLANNED INTERVENTIONS: Therapeutic exercises, Therapeutic activity, Patient/Family education, Self Care, Manual lymph drainage, Compression bandaging, scar mobilization, Manual therapy, and skin care throughout MLD  PLAN FOR NEXT SESSION:  MLD and simultaneous skin care to LLE LLE multilayer compression wraps as established Cont Pt edu for LE self care   Loel Dubonnet, MS, OTR/L, CLT-LANA 03/10/23 8:56 AM`

## 2023-03-13 ENCOUNTER — Other Ambulatory Visit: Payer: Self-pay | Admitting: Family Medicine

## 2023-03-15 ENCOUNTER — Encounter: Payer: Self-pay | Admitting: Occupational Therapy

## 2023-03-15 ENCOUNTER — Ambulatory Visit: Payer: Medicare Other | Admitting: Occupational Therapy

## 2023-03-15 DIAGNOSIS — I890 Lymphedema, not elsewhere classified: Secondary | ICD-10-CM | POA: Diagnosis not present

## 2023-03-15 NOTE — Therapy (Signed)
+- OUTPATIENT OCCUPATIONAL THERAPY TREATMENT NOTE  LOWER EXTREMITY LYMPHEDEMA Patient Name: Emily Wood MRN: [id number] DOB:11/07/1950, 72 y.o., female Today's Date: 03/15/2023  END OF SESSION:   OT End of Session - 03/15/23 0922     Visit Number 33    Number of Visits 36    Date for OT Re-Evaluation 04/20/23    OT Start Time 0803    OT Stop Time 0905    OT Time Calculation (min) 62 min    Activity Tolerance Patient tolerated treatment well;No increased pain    Behavior During Therapy [facility] for tasks assessed/performed               Past Medical History:  Diagnosis Date   Arthritis    Diabetes mellitus without complication (HCC)    Family history of adverse reaction to anesthesia    PONV   Graves disease 04/14/2020   Hypertension    Lymphedema    Mixed hyperlipidemia 02/20/2021   Past Surgical History:  Procedure Laterality Date   CATARACT EXTRACTION W/PHACO Right 04/14/2022   Procedure: CATARACT EXTRACTION PHACO AND INTRAOCULAR LENS PLACEMENT (IOC) RIGHT DIABETIC 10.93 00:53.0;  Surgeon: Galen Manila, MD;  Location: [facility];  Service: Ophthalmology;  Laterality: Right;   CATARACT EXTRACTION W/PHACO Left 04/28/2022   Procedure: CATARACT EXTRACTION PHACO AND INTRAOCULAR LENS PLACEMENT (IOC) LEFT DIABETIC 6.05 00:45.7;  Surgeon: Galen Manila, MD;  Location: [facility];  Service: Ophthalmology;  Laterality: Left;  Diabetic   CHOLECYSTECTOMY     HERNIA REPAIR     OTHER SURGICAL HISTORY     scar tissue removal from bowels   PARTIAL HYSTERECTOMY     Still has ovaries   REVERSE SHOULDER ARTHROPLASTY Right 09/11/2022   Procedure: REVERSE SHOULDER ARTHROPLASTY;  Surgeon: Yolonda Kida, MD;  Location: [facility];  Service: Orthopedics;  Laterality: Right;  120   UTERINE FIBROID SURGERY     Patient Active Problem List   Diagnosis Date Noted   Normocytic anemia 05/23/2022   AKI (acute kidney injury) (HCC) 05/21/2022   Snoring  11/05/2021   Daytime somnolence 11/05/2021   Coronary artery disease involving native coronary artery of native heart without angina pectoris 05/15/2021   Nonrheumatic tricuspid valve regurgitation 05/15/2021   Pulmonary hypertension, unspecified (HCC) 05/15/2021   Mixed hyperlipidemia 02/20/2021   Obesity (BMI 30-39.9) 02/20/2021   Diabetic retinopathy (HCC) 08/28/2020   Mixed incontinence 05/31/2020   Graves disease 04/14/2020   Right renal mass 11/22/2019   Arthritis of left knee 09/25/2019   Lymphedema 01/25/2017   Fibroid uterus 01/14/2017   Chronic kidney disease, stage 3 (HCC) 09/21/2016   Meralgia paresthetica of left side 08/27/2015   Diabetes mellitus type 2, controlled (HCC) 08/02/2015   Chronic venous insufficiency 06/28/2015   Essential hypertension 02/15/2015   Peripheral edema 02/15/2015   Chronic knee pain 02/15/2015    PCP: Pearline Cables, MD  REFERRING PROVIDER: Sheppard Plumber, NP  REFERRING DIAG: I89.0  THERAPY DIAG:  Lymphedema, not elsewhere classified  Rationale for Evaluation and Treatment: Rehabilitation  ONSET DATE: insidious onset >30 years  SUBJECTIVE:  SUBJECTIVE STATEMENT: [name] presents to Occupational Therapy for treatment of BLE lymphedema. She arrives with knee length, multilayer compression wraps in place on RLE and compression stocking in place on the L.  She is unaccompanied and walks to clinic. Pt denies LE-related leg pain, but continues with mild OA related knee pain. She has no new concerns.  PERTINENT HISTORY:  Medical Hx contributing to, or affected by chronic, progressive lymphedema: Norvasc prescription-common side effect is leg swelling; HTN, CVI, Obesity, CAD, OA knees, CKD stage 3, Graves Disease, DM  PAIN:  Are you having pain? Yes:  R arthritis knee pain;  1-2/10 Pain description: stiff, aching, throbbing, tight, heavy Aggravating factors: standing , walking,  Relieving factors: "The medication from my cardiologist", elevation, compression stockings ( Pt has good hip AROM bilaterally and is able to don them herself w extra time in clinic.)  PRECAUTIONS: Fall and Other: LYMPHEDEMA PRECAUTIONS: (CKD, CAD and DM, GRAVES )   WEIGHT BEARING RESTRICTIONS: No  FALLS:  Has patient fallen since last visit? NO    HAND DOMINANCE: right   PRIOR LEVEL OF FUNCTION: Requires assistive device for independence, Needs assistance with homemaking, and Needs assistance with gait  PATIENT GOALS: ".... to get this lymphedema under control. It's embarrassing. That's why I wear long skirts."   OBJECTIVE:   Mild, Stage  II, Bilateral Lower Extremity Lymphedema 2/2 CVI and Obesity  OBSERVATIONS / OTHER ASSESSMENTS:   Lymphedema Life Impact Scape (LLIS): Intake 11/03/22: 50% (The extent to which L:E-related problems affected your life during the past week)  FOTO functional outcome scale: Intake 11/16/22: 63%  BLE COMPARATIVE LIMB VOLUMETRICS:   Intake measurements completed 10/22/22  LANDMARK RIGHT   R LEG (A-D) 5043.4 ml  R THIGH (E-G) 4182.3 ml  R FULL LIMB (A-G) 7643.8 ml  Limb Volume differential (LVD)   Leg LVD =12.14%, L>R Thigh LVD 8.3%, L>R Full limb LVD= 8.3%   Volume change since initial %  Volume change overall V  (Blank rows = not tested)  LANDMARK LEFT    R LEG (A-D) 5656.0 ml  R THIGH (E-G) 4529.5 ml  R FULL LIMB (A-G) 10185.5 ml  Limb Volume differential (LVD)  %  Volume change since initial %  Volume change overall %  (Blank rows = not tested)   9th Visit: 12/07/22  [facility] RIGHT   R LEG (A-D) 5116.3 ml  R THIGH (E-G) 5047.6  ml  R FULL LIMB (A-G) 10163.9  ml  Limb Volume differential (LVD)    Volume change since initial R LEG volume INCREASED by 1.4%;  R THIGH INCREASED by 20.7%, and R FULL LIMB  INCREASED by 10.17% since initial on 10/22/22.  Volume change overall V  (Blank rows = not tested)       19 th visit 01/18/23  LANDMARK RIGHT   R LEG (A-D) 4606.5 ml  R THIGH (E-G) 4617.0 ml  R FULL LIMB (A-G) 9224 ml  Limb Volume differential (LVD)    Volume change since initial Since last measured on 9th Rx visit on 12/07/22, R LEG volume is DECREASED by 9.96 %, R THIGH volume is DECREASED by 8.5 %, and R FULL LIMB volume is DECREASED by 9.25 %   Volume change overall Since initially measured on 10/22/22 on 1st Rx visit,  R LEG volume is DECREASED by 8.67 % overall, R THIGH volume is INCREASED by 10 %, and R FULL LIMB volume is DECREASED by 0.018 %    29 th Visit 03/02/23  LANDMARK RIGHT   R LEG (A-D) 5749.7 ml  R THIGH (E-G)  ml  R FULL LIMB (A-G)  ml  Limb Volume differential (LVD)    Volume change since last Since 01/18/23 R leg  is INCREASED by 15.15%.  Volume change overall   (Blank rows = not tested)  LANDMARK LEFT    L LEG (A-D) Since initial 11/11/22 L LEG is INCREASED by 1.66%.   L THIGH (E-G)   L FULL LIMB (A-G)   Limb Volume differential (LVD)    Volume change since initial 1.66%  Volume change overall      (Blank rows = not tested)  TODAY'S TREATMENT:  LLE/LLQ MLD as established LLE multilayer gradient compression wraps-knee high Pt edu for LE self-care and progress towards goals to date                  PATIENT EDUCATION:  Pt edu for LE self care:  Continued Pt/ CG edu for lymphedema self care home program throughout session. Emphasis today on lymphedema precautions and cellulitis ID, prevention and Rx. Discussed again fluid dynamics re elevation, and importance of elevation for lymphedema management and venous return. Other topics include outcome of comparative limb volumetrics- starting limb volume differentials (LVDs), technology and gradient techniques used for short stretch, multilayer compression wrapping, simple self-MLD, therapeutic lymphatic pumping exercises,  skin/nail care, LE precautions,. compression garment recommendations and specifications, wear and care schedule and compression garment donning / doffing w assistive devices. Discussed progress towards all OT goals since commencing CDT. All questions answered to the Pt's satisfaction. Good return. Person educated: Patient Education method: Explanation, Demonstration, Tactile cues, Verbal cues, and Handouts Education comprehension: verbalized understanding, returned demonstration, and needs further education  HOME EXERCISE PROGRAM: Intensive Phase Complete Decongestive Therapy (CDT) includes Daily Manual lymphatic drainage: Short neck sequence bilaterally w diaphragmatic breathing to engage deep abdominal lymphatics. Stationary J strokes to inguinal LN, then proximal to distal dynamic J strokes to thigh, "bottleneck" region, leg and foot. Finally 3 retrograde sweeps back to terminus. Daily Skin care w/ low ph lotion matching skin ph to limit infection risk and improve tissue flexibility and excursion Daily, BLE lymphatic pumping therex- seated or supine Daily (23/7) multilayer compression bandages below the knee to reduce limb volume, one limb at a time (One each 8 cm, 10 cm and 12 cm wide x 5 meters long short stretch compression wrap applied circumferentially using gradient layering techniques over single layer of cotton stockinett an Rosidal foam- toes to popliteal fossa  Self-management Phase CDT includes: All of the above and Appropriate daytime compression garments and HOS devices full time daily. Replace q 3-6 months Elvarex, flat knit, ccl 225-32 mmHg) knee highs w open toe, t heel, oblique top edge, 2.5 cm wide SB on top, tricot patch at anterior ankle sewn on all 4 sides. To be work full time waking hours. Jobst RELAX knee high HOS device  Custom-made gradient compression garments and HOS devices are medically necessary in this case because they are uniquely sized and shaped to fit the  exact dimensions of the affected extremities with deformities, and to provide accurate and consistent gradient compression and containment, essential to optimally managing this patient's symptoms of chronic, progressive lymphedema. Multiple custom compression garments are needed for optimal hygiene to limit infection risk. Custom compression garments should be replaced q 3-6 months When worn consistently for optimal lipo-lymphedema self-management over time.   ASSESSMENT: CLINICAL IMPRESSION: LLE skin condition continues to improve as hyperkeratosis at  distal leg slowly recedes. Volume reduction fibrosis also slowly but steadily reducing.   Pt tolerated MLD  with simultaneous skin care w emphasis on leg and ankle without increased pain.  Cont as per POC.    OBJECTIVE IMPAIRMENTS: low vision, Abnormal gait, decreased activity tolerance, decreased balance, decreased endurance, decreased knowledge of condition, decreased knowledge of use of DME, decreased mobility, decreased ROM, increased edema, impaired flexibility, postural dysfunction, obesity, and pain.   ACTIVITY LIMITATIONS: functional mobility and ambulation (carrying, lifting, bending, sitting, standing, squatting, sleeping, stairs, transfers, bed mobility, continence, Basic and instrumental ADLs (bathing, dressing, hygiene/grooming, Productive activities ( caring for others) and leisure pursuits, body image, and social participation  PERSONAL FACTORS:  multiple co morbidities, length of time with progressive condition, are also affecting patient's functional outcome.   REHAB POTENTIAL: Fair    EVALUATION COMPLEXITY: Moderate   GOALS: Goals reviewed with patient? Yes  SHORT TERM GOALS: Target date: 4th OT Rx visit   Pt will demonstrate understanding of lymphedema precautions and prevention strategies with modified independence using a printed reference to identify at least 5 precautions and discussing how s/he may implement them into  daily life to reduce risk of progression with extra time (Modified Independence). Baseline:Max A Goal status: 12/07/22 GOAL MET  2.  Pt will be able to apply multilayer, thigh length, gradient, compression wraps to one leg at a time with modified assistance (extra time) to decrease limb volume, to limit infection risk, and to limit lymphedema progression.  Baseline: Dependent Goal status: 11/16/22 PROGRESSING 11/26/22 GOAL MET  LONG TERM GOALS: Target date: 02/01/23  Given this patient's Intake score of 63/100% on the functional outcomes FOTO tool, patient will experience an increase in function of 3 points to improve basic and instrumental ADLs performance, including lymphedema self-care.  Baseline: ONGOING   Goal status:12/07/22 GOAL ONGOING,  01/20/23 PROGRESSING 03/03/23 PROGRESSING   2.  Given this patient's Intake score of 50.0 % on the Lymphedema Life Impact Scale (LLIS), patient will experience a reduction of at least 5 points in her perceived level of functional impairment resulting from lymphedema to improve functional performance and quality of life (QOL). Baseline: 50% Goal status: 12/07/22 GOAL ONGOING; 01/20/23 PROGRESSING,  03/03/23 PROGRESSING  3.  During Intensive phase CDT Pt will achieve at least 85% compliance with all lymphedema self-care home program components, including daily skin care, compression wraps and /or garments, simple self MLD and lymphatic pumping therex, and elevation when sitting to habituate LE self care protocol  into ADLs for optimal LE self-management over time. Baseline: Dependent Goal status: 12/08/22 ONGOING. Pt not changing wraps daily as directed, but will work on increasing elevation anytime she is seated for next progress report. Pt will also increase frequency of re-wrapping her leg to ensure optimal volume reduction. GOAL MET 01/20/23  4.  Pt will achieve at least a 10% volume reduction below the knees to return limb to typical size and shape, to limit  infection risk and LE progression, to decrease pain, to improve function. Baseline: dependent Goal status: 12/07/22 PROGRESSING 01/18/23 : Leg reduction= 8.67% since initial. Thigh  is Increased 10% since initial, And full LE is decreased by 0.018% since 11/11/22;  01/20/23 GOAL MET for L LEG w 9.96 % reduction . 03/01/23 BLE comparative limb volumetrics reveal a  significant 15.15% increase in limb volume since last measured on 01/18/23. The LLE , the current Rx limb, is increased by 1.66% since initially measured in 4/24.  See volumetric chart for additional  details.  5.  Pt will obtain proper compression garments/compression braces and be modified independent with donning/doffing to optimize/stabilize with fluid reduction. Baseline:  Goal status: 12/07/22 PROGRESSING. 01/20/23 PROGRESSING.02/08/23 GOAL MET for RLE  PLAN:  PT FREQUENCY: 2x/week  PT DURATION: 12 weeks  PLANNED INTERVENTIONS: Therapeutic exercises, Therapeutic activity, Patient/Family education, Self Care, Manual lymph drainage, Compression bandaging, scar mobilization, Manual therapy, and skin care throughout MLD  PLAN FOR NEXT SESSION:  MLD and simultaneous skin care to LLE LLE multilayer compression wraps as established Cont Pt edu for LE self care   Loel Dubonnet, MS, OTR/L, CLT-LANA 03/15/23 9:23 AM`

## 2023-03-17 ENCOUNTER — Ambulatory Visit: Payer: Medicare Other | Admitting: Occupational Therapy

## 2023-03-17 ENCOUNTER — Encounter: Payer: Self-pay | Admitting: Occupational Therapy

## 2023-03-17 DIAGNOSIS — I890 Lymphedema, not elsewhere classified: Secondary | ICD-10-CM

## 2023-03-17 NOTE — Therapy (Signed)
+- OUTPATIENT OCCUPATIONAL THERAPY TREATMENT NOTE  LOWER EXTREMITY LYMPHEDEMA Patient Name: SANIAH MANALAC MRN: [id number] DOB:[birth year], 72 y.o., female Today's Date: 03/17/2023  END OF SESSION:   OT End of Session - 03/17/23 0907     Visit Number 34    Number of Visits 36    Date for OT Re-Evaluation 04/20/23    OT Start Time 0902    OT Stop Time 1004    OT Time Calculation (min) 62 min    Activity Tolerance Patient tolerated treatment well;No increased pain    Behavior During Therapy [facility] for tasks assessed/performed               Past Medical History:  Diagnosis Date   Arthritis    Diabetes mellitus without complication (HCC)    Family history of adverse reaction to anesthesia    PONV   Graves disease 04/14/2020   Hypertension    Lymphedema    Mixed hyperlipidemia 02/20/2021   Past Surgical History:  Procedure Laterality Date   CATARACT EXTRACTION W/PHACO Right 04/14/2022   Procedure: CATARACT EXTRACTION PHACO AND INTRAOCULAR LENS PLACEMENT (IOC) RIGHT DIABETIC 10.93 00:53.0;  Surgeon: Galen Manila, MD;  Location: [facility];  Service: Ophthalmology;  Laterality: Right;   CATARACT EXTRACTION W/PHACO Left 04/28/2022   Procedure: CATARACT EXTRACTION PHACO AND INTRAOCULAR LENS PLACEMENT (IOC) LEFT DIABETIC 6.05 00:45.7;  Surgeon: Galen Manila, MD;  Location: [facility];  Service: Ophthalmology;  Laterality: Left;  Diabetic   CHOLECYSTECTOMY     HERNIA REPAIR     OTHER SURGICAL HISTORY     scar tissue removal from bowels   PARTIAL HYSTERECTOMY     Still has ovaries   REVERSE SHOULDER ARTHROPLASTY Right 09/11/2022   Procedure: REVERSE SHOULDER ARTHROPLASTY;  Surgeon: Yolonda Kida, MD;  Location: [facility];  Service: Orthopedics;  Laterality: Right;  120   UTERINE FIBROID SURGERY     Patient Active Problem List   Diagnosis Date Noted   Normocytic anemia 05/23/2022   AKI (acute kidney injury) (HCC) 05/21/2022   Snoring  11/05/2021   Daytime somnolence 11/05/2021   Coronary artery disease involving native coronary artery of native heart without angina pectoris 05/15/2021   Nonrheumatic tricuspid valve regurgitation 05/15/2021   Pulmonary hypertension, unspecified (HCC) 05/15/2021   Mixed hyperlipidemia 02/20/2021   Obesity (BMI 30-39.9) 02/20/2021   Diabetic retinopathy (HCC) 08/28/2020   Mixed incontinence 05/31/2020   Graves disease 04/14/2020   Right renal mass 11/22/2019   Arthritis of left knee 09/25/2019   Lymphedema 01/25/2017   Fibroid uterus 01/14/2017   Chronic kidney disease, stage 3 (HCC) 09/21/2016   Meralgia paresthetica of left side 08/27/2015   Diabetes mellitus type 2, controlled (HCC) 08/02/2015   Chronic venous insufficiency 06/28/2015   Essential hypertension 02/15/2015   Peripheral edema 02/15/2015   Chronic knee pain 02/15/2015    PCP: Pearline Cables, MD  REFERRING PROVIDER: Sheppard Plumber, NP  REFERRING DIAG: I89.0  THERAPY DIAG:  Lymphedema, not elsewhere classified  Rationale for Evaluation and Treatment: Rehabilitation  ONSET DATE: insidious onset >30 years  SUBJECTIVE:  SUBJECTIVE STATEMENT: Mrs Calzado presents to Occupational Therapy for treatment of BLE lymphedema. She arrives with knee length, multilayer compression wraps in place on RLE and compression stocking in place on the L.  She is unaccompanied and walks to clinic. Pt denies LE-related leg pain, but continues with mild OA related knee pain. She has no new concerns.  PERTINENT HISTORY:  Medical Hx contributing to, or affected by chronic, progressive lymphedema: Norvasc prescription-common side effect is leg swelling; HTN, CVI, Obesity, CAD, OA knees, CKD stage 3, Graves Disease, DM  PAIN:  Are you having pain? Yes:  R arthritis knee pain;  7/10 Pain description: stiff, aching, throbbing, tight, heavy Aggravating factors: standing , walking,  Relieving factors: "The medication from my cardiologist", elevation, compression stockings ( Pt has good hip AROM bilaterally and is able to don them herself w extra time in clinic.)  PRECAUTIONS: Fall and Other: LYMPHEDEMA PRECAUTIONS: (CKD, CAD and DM, GRAVES )   WEIGHT BEARING RESTRICTIONS: No  FALLS:  Has patient fallen since last visit? NO    HAND DOMINANCE: right   PRIOR LEVEL OF FUNCTION: Requires assistive device for independence, Needs assistance with homemaking, and Needs assistance with gait  PATIENT GOALS: ".... to get this lymphedema under control. It's embarrassing. That's why I wear long skirts."   OBJECTIVE:   Mild, Stage  II, Bilateral Lower Extremity Lymphedema 2/2 CVI and Obesity  OBSERVATIONS / OTHER ASSESSMENTS:   Lymphedema Life Impact Scape (LLIS): Intake 11/03/22: 50% (The extent to which L:E-related problems affected your life during the past week)  FOTO functional outcome scale: Intake 11/16/22: 63%  BLE COMPARATIVE LIMB VOLUMETRICS:   Intake measurements completed 10/22/22  LANDMARK RIGHT   R LEG (A-D) 5043.4 ml  R THIGH (E-G) 4182.3 ml  R FULL LIMB (A-G) 7643.8 ml  Limb Volume differential (LVD)   Leg LVD =12.14%, L>R Thigh LVD 8.3%, L>R Full limb LVD= 8.3%   Volume change since initial %  Volume change overall V  (Blank rows = not tested)  LANDMARK LEFT    R LEG (A-D) 5656.0 ml  R THIGH (E-G) 4529.5 ml  R FULL LIMB (A-G) 10185.5 ml  Limb Volume differential (LVD)  %  Volume change since initial %  Volume change overall %  (Blank rows = not tested)   9th Visit: 12/07/22  [facility] RIGHT   R LEG (A-D) 5116.3 ml  R THIGH (E-G) 5047.6  ml  R FULL LIMB (A-G) 10163.9  ml  Limb Volume differential (LVD)    Volume change since initial R LEG volume INCREASED by 1.4%;  R THIGH INCREASED by 20.7%, and R FULL LIMB  INCREASED by 10.17% since initial on 10/22/22.  Volume change overall V  (Blank rows = not tested)       19 th visit 01/18/23  LANDMARK RIGHT   R LEG (A-D) 4606.5 ml  R THIGH (E-G) 4617.0 ml  R FULL LIMB (A-G) 9224 ml  Limb Volume differential (LVD)    Volume change since initial Since last measured on 9th Rx visit on 12/07/22, R LEG volume is DECREASED by 9.96 %, R THIGH volume is DECREASED by 8.5 %, and R FULL LIMB volume is DECREASED by 9.25 %   Volume change overall Since initially measured on 10/22/22 on 1st Rx visit,  R LEG volume is DECREASED by 8.67 % overall, R THIGH volume is INCREASED by 10 %, and R FULL LIMB volume is DECREASED by 0.018 %    29 th Visit 03/02/23  LANDMARK RIGHT   R LEG (A-D) 5749.7 ml  R THIGH (E-G)  ml  R FULL LIMB (A-G)  ml  Limb Volume differential (LVD)    Volume change since last Since 01/18/23 R leg  is INCREASED by 15.15%.  Volume change overall   (Blank rows = not tested)  LANDMARK LEFT    L LEG (A-D) Since initial 11/11/22 L LEG is INCREASED by 1.66%.   L THIGH (E-G)   L FULL LIMB (A-G)   Limb Volume differential (LVD)    Volume change since initial 1.66%  Volume change overall      (Blank rows = not tested)  TODAY'S TREATMENT:  LLE/LLQ MLD as established LLE multilayer gradient compression wraps-knee high Pt edu for LE self-care and progress towards goals to date                  PATIENT EDUCATION:  Pt edu for LE self care:  Continued Pt/ CG edu for lymphedema self care home program throughout session. Emphasis today on lymphedema precautions and cellulitis ID, prevention and Rx. Discussed again fluid dynamics re elevation, and importance of elevation for lymphedema management and venous return. Other topics include outcome of comparative limb volumetrics- starting limb volume differentials (LVDs), technology and gradient techniques used for short stretch, multilayer compression wrapping, simple self-MLD, therapeutic lymphatic pumping exercises,  skin/nail care, LE precautions,. compression garment recommendations and specifications, wear and care schedule and compression garment donning / doffing w assistive devices. Discussed progress towards all OT goals since commencing CDT. All questions answered to the Pt's satisfaction. Good return. Person educated: Patient Education method: Explanation, Demonstration, Tactile cues, Verbal cues, and Handouts Education comprehension: verbalized understanding, returned demonstration, and needs further education  HOME EXERCISE PROGRAM: Intensive Phase Complete Decongestive Therapy (CDT) includes Daily Manual lymphatic drainage: Short neck sequence bilaterally w diaphragmatic breathing to engage deep abdominal lymphatics. Stationary J strokes to inguinal LN, then proximal to distal dynamic J strokes to thigh, "bottleneck" region, leg and foot. Finally 3 retrograde sweeps back to terminus. Daily Skin care w/ low ph lotion matching skin ph to limit infection risk and improve tissue flexibility and excursion Daily, BLE lymphatic pumping therex- seated or supine Daily (23/7) multilayer compression bandages below the knee to reduce limb volume, one limb at a time (One each 8 cm, 10 cm and 12 cm wide x 5 meters long short stretch compression wrap applied circumferentially using gradient layering techniques over single layer of cotton stockinett an Rosidal foam- toes to popliteal fossa  Self-management Phase CDT includes: All of the above and Appropriate daytime compression garments and HOS devices full time daily. Replace q 3-6 months Elvarex, flat knit, ccl 225-32 mmHg) knee highs w open toe, t heel, oblique top edge, 2.5 cm wide SB on top, tricot patch at anterior ankle sewn on all 4 sides. To be work full time waking hours. Jobst RELAX knee high HOS device  Custom-made gradient compression garments and HOS devices are medically necessary in this case because they are uniquely sized and shaped to fit the  exact dimensions of the affected extremities with deformities, and to provide accurate and consistent gradient compression and containment, essential to optimally managing this patient's symptoms of chronic, progressive lymphedema. Multiple custom compression garments are needed for optimal hygiene to limit infection risk. Custom compression garments should be replaced q 3-6 months When worn consistently for optimal lipo-lymphedema self-management over time.   ASSESSMENT:  CLINICAL IMPRESSION: LLE skin condition continues to improve as hyperkeratosis  at distal leg slowly recedes. Volume reduction fibrosis also slowly but steadily reducing.   Pt tolerated MLD  with simultaneous skin care w emphasis on leg and ankle without increased pain.  Cont as per POC.    OBJECTIVE IMPAIRMENTS: low vision, Abnormal gait, decreased activity tolerance, decreased balance, decreased endurance, decreased knowledge of condition, decreased knowledge of use of DME, decreased mobility, decreased ROM, increased edema, impaired flexibility, postural dysfunction, obesity, and pain.   ACTIVITY LIMITATIONS: functional mobility and ambulation (carrying, lifting, bending, sitting, standing, squatting, sleeping, stairs, transfers, bed mobility, continence, Basic and instrumental ADLs (bathing, dressing, hygiene/grooming, Productive activities ( caring for others) and leisure pursuits, body image, and social participation  PERSONAL FACTORS:  multiple co morbidities, length of time with progressive condition, are also affecting patient's functional outcome.   REHAB POTENTIAL: Fair    EVALUATION COMPLEXITY: Moderate   GOALS: Goals reviewed with patient? Yes  SHORT TERM GOALS: Target date: 4th OT Rx visit   Pt will demonstrate understanding of lymphedema precautions and prevention strategies with modified independence using a printed reference to identify at least 5 precautions and discussing how s/he may implement them into  daily life to reduce risk of progression with extra time (Modified Independence). Baseline:Max A Goal status: 12/07/22 GOAL MET  2.  Pt will be able to apply multilayer, thigh length, gradient, compression wraps to one leg at a time with modified assistance (extra time) to decrease limb volume, to limit infection risk, and to limit lymphedema progression.  Baseline: Dependent Goal status: 11/16/22 PROGRESSING 11/26/22 GOAL MET  LONG TERM GOALS: Target date: 02/01/23  Given this patient's Intake score of 63/100% on the functional outcomes FOTO tool, patient will experience an increase in function of 3 points to improve basic and instrumental ADLs performance, including lymphedema self-care.  Baseline: ONGOING   Goal status:12/07/22 GOAL ONGOING,  01/20/23 PROGRESSING 03/03/23 PROGRESSING   2.  Given this patient's Intake score of 50.0 % on the Lymphedema Life Impact Scale (LLIS), patient will experience a reduction of at least 5 points in her perceived level of functional impairment resulting from lymphedema to improve functional performance and quality of life (QOL). Baseline: 50% Goal status: 12/07/22 GOAL ONGOING; 01/20/23 PROGRESSING,  03/03/23 PROGRESSING  3.  During Intensive phase CDT Pt will achieve at least 85% compliance with all lymphedema self-care home program components, including daily skin care, compression wraps and /or garments, simple self MLD and lymphatic pumping therex, and elevation when sitting to habituate LE self care protocol  into ADLs for optimal LE self-management over time. Baseline: Dependent Goal status: 12/08/22 ONGOING. Pt not changing wraps daily as directed, but will work on increasing elevation anytime she is seated for next progress report. Pt will also increase frequency of re-wrapping her leg to ensure optimal volume reduction. GOAL MET 01/20/23  4.  Pt will achieve at least a 10% volume reduction below the knees to return limb to typical size and shape, to limit  infection risk and LE progression, to decrease pain, to improve function. Baseline: dependent Goal status: 12/07/22 PROGRESSING 01/18/23 : Leg reduction= 8.67% since initial. Thigh  is Increased 10% since initial, And full LE is decreased by 0.018% since 11/11/22;  01/20/23 GOAL MET for L LEG w 9.96 % reduction . 03/01/23 BLE comparative limb volumetrics reveal a  significant 15.15% increase in limb volume since last measured on 01/18/23. The LLE , the current Rx limb, is increased by 1.66% since initially measured in 4/24.  See volumetric chart for  additional details.  5.  Pt will obtain proper compression garments/compression braces and be modified independent with donning/doffing to optimize/stabilize with fluid reduction. Baseline:  Goal status: 12/07/22 PROGRESSING. 01/20/23 PROGRESSING.02/08/23 GOAL MET for RLE  PLAN:  PT FREQUENCY: 2x/week  PT DURATION: 12 weeks  PLANNED INTERVENTIONS: Therapeutic exercises, Therapeutic activity, Patient/Family education, Self Care, Manual lymph drainage, Compression bandaging, scar mobilization, Manual therapy, and skin care throughout MLD  PLAN FOR NEXT SESSION:  MLD and simultaneous skin care to LLE LLE multilayer compression wraps as established Cont Pt edu for LE self care   Loel Dubonnet, MS, OTR/L, CLT-LANA 03/17/23 10:04 AM`

## 2023-03-18 ENCOUNTER — Encounter: Payer: Self-pay | Admitting: Podiatry

## 2023-03-18 ENCOUNTER — Ambulatory Visit: Payer: Medicare Other | Admitting: Podiatry

## 2023-03-18 DIAGNOSIS — M79675 Pain in left toe(s): Secondary | ICD-10-CM | POA: Diagnosis not present

## 2023-03-18 DIAGNOSIS — M21619 Bunion of unspecified foot: Secondary | ICD-10-CM

## 2023-03-18 DIAGNOSIS — M79674 Pain in right toe(s): Secondary | ICD-10-CM

## 2023-03-18 DIAGNOSIS — I890 Lymphedema, not elsewhere classified: Secondary | ICD-10-CM

## 2023-03-18 DIAGNOSIS — B351 Tinea unguium: Secondary | ICD-10-CM | POA: Diagnosis not present

## 2023-03-18 NOTE — Progress Notes (Signed)
Subjective:   Patient ID: [name], female   DOB: 72 y.o.   MRN: [id number]   HPI Patient presents with several problems 1 being the thick toenails that she cannot cut that gets sore #2 she is developing increased bunion structure right that she is concerned about and #3 chronic lymphedema   ROS      Objective:  Physical Exam  Neurovascular status intact patient is noted to have thick yellow brittle nailbeds 1-5 both feet dystrophic painful and difficult to wear shoe gear comfortably along with significant lymphedema being treated and structural bunion deformity right with slight redness and deviation     Assessment:  Chronic mycotic nail infection with pain 1-5 both feet along with bunion that is gradually intensifying right moderate discomfort but not significant and lymphedema being treated     Plan:  H&P reviewed all problems.  For the bunion we did discuss correction but I do not currently recommend with her lymphedema and diabetes that is under good control.  I did debride nailbeds 1-5 both feet Neutra genic bleeding reappoint routine care

## 2023-03-19 ENCOUNTER — Encounter: Payer: Medicare Other | Admitting: Occupational Therapy

## 2023-03-23 ENCOUNTER — Encounter: Payer: Medicare Other | Admitting: Occupational Therapy

## 2023-03-24 ENCOUNTER — Ambulatory Visit: Payer: Medicare Other | Attending: Nurse Practitioner | Admitting: Occupational Therapy

## 2023-03-24 ENCOUNTER — Encounter: Payer: Self-pay | Admitting: Occupational Therapy

## 2023-03-24 DIAGNOSIS — I890 Lymphedema, not elsewhere classified: Secondary | ICD-10-CM | POA: Diagnosis not present

## 2023-03-24 NOTE — Therapy (Signed)
+- OUTPATIENT OCCUPATIONAL THERAPY TREATMENT NOTE  LOWER EXTREMITY LYMPHEDEMA Patient Name: EMAHNI MEINHART MRN: [id number] DOB:Dec 10, 1950, 72 y.o., female Today's Date: 03/24/2023  END OF SESSION:   OT End of Session - 03/24/23 0816     Visit Number 35    Number of Visits 36    Date for OT Re-Evaluation 04/20/23    OT Start Time 0809    OT Stop Time 0901    OT Time Calculation (min) 52 min    Activity Tolerance Patient tolerated treatment well;No increased pain    Behavior During Therapy [facility] for tasks assessed/performed               Past Medical History:  Diagnosis Date   Arthritis    Diabetes mellitus without complication (HCC)    Family history of adverse reaction to anesthesia    PONV   Graves disease 04/14/2020   Hypertension    Lymphedema    Mixed hyperlipidemia 02/20/2021   Past Surgical History:  Procedure Laterality Date   CATARACT EXTRACTION W/PHACO Right 04/14/2022   Procedure: CATARACT EXTRACTION PHACO AND INTRAOCULAR LENS PLACEMENT (IOC) RIGHT DIABETIC 10.93 00:53.0;  Surgeon: Galen Manila, MD;  Location: [facility];  Service: Ophthalmology;  Laterality: Right;   CATARACT EXTRACTION W/PHACO Left 04/28/2022   Procedure: CATARACT EXTRACTION PHACO AND INTRAOCULAR LENS PLACEMENT (IOC) LEFT DIABETIC 6.05 00:45.7;  Surgeon: Galen Manila, MD;  Location: [facility];  Service: Ophthalmology;  Laterality: Left;  Diabetic   CHOLECYSTECTOMY     HERNIA REPAIR     OTHER SURGICAL HISTORY     scar tissue removal from bowels   PARTIAL HYSTERECTOMY     Still has ovaries   REVERSE SHOULDER ARTHROPLASTY Right 09/11/2022   Procedure: REVERSE SHOULDER ARTHROPLASTY;  Surgeon: Yolonda Kida, MD;  Location: [facility];  Service: Orthopedics;  Laterality: Right;  120   UTERINE FIBROID SURGERY     Patient Active Problem List   Diagnosis Date Noted   Normocytic anemia 05/23/2022   AKI (acute kidney injury) (HCC) 05/21/2022   Snoring  11/05/2021   Daytime somnolence 11/05/2021   Coronary artery disease involving native coronary artery of native heart without angina pectoris 05/15/2021   Nonrheumatic tricuspid valve regurgitation 05/15/2021   Pulmonary hypertension, unspecified (HCC) 05/15/2021   Mixed hyperlipidemia 02/20/2021   Obesity (BMI 30-39.9) 02/20/2021   Diabetic retinopathy (HCC) 08/28/2020   Mixed incontinence 05/31/2020   Graves disease 04/14/2020   Right renal mass 11/22/2019   Arthritis of left knee 09/25/2019   Lymphedema 01/25/2017   Fibroid uterus 01/14/2017   Chronic kidney disease, stage 3 (HCC) 09/21/2016   Meralgia paresthetica of left side 08/27/2015   Diabetes mellitus type 2, controlled (HCC) 08/02/2015   Chronic venous insufficiency 06/28/2015   Essential hypertension 02/15/2015   Peripheral edema 02/15/2015   Chronic knee pain 02/15/2015    PCP: Pearline Cables, MD  REFERRING PROVIDER: Sheppard Plumber, NP  REFERRING DIAG: I89.0  THERAPY DIAG:  Lymphedema, not elsewhere classified  Rationale for Evaluation and Treatment: Rehabilitation  ONSET DATE: insidious onset >30 years  SUBJECTIVE:  SUBJECTIVE STATEMENT: Mrs Hardwell presents to Occupational Therapy for treatment of BLE lymphedema. She arrives with knee length, multilayer compression wraps in place on RLE and compression stocking in place on the L.  She is unaccompanied and walks to clinic. Pt denies LE-related leg pain, but continues with OA related R knee pain 6/10, despite recent gel injection.   PERTINENT HISTORY:  Medical Hx contributing to, or affected by chronic, progressive lymphedema: Norvasc prescription-common side effect is leg swelling; HTN, CVI, Obesity, CAD, OA knees, CKD stage 3, Graves Disease, DM  PAIN:  Are you having  pain? Yes: R arthritis knee pain;  6/10 Pain description: stiff, aching, throbbing, tight, heavy Aggravating factors: standing , walking,  Relieving factors: "The medication from my cardiologist", elevation, compression stockings ( Pt has good hip AROM bilaterally and is able to don them herself w extra time in clinic.)  PRECAUTIONS: Fall and Other: LYMPHEDEMA PRECAUTIONS: (CKD, CAD and DM, GRAVES )   WEIGHT BEARING RESTRICTIONS: No  FALLS:  Has patient fallen since last visit? NO    HAND DOMINANCE: right   PRIOR LEVEL OF FUNCTION: Requires assistive device for independence, Needs assistance with homemaking, and Needs assistance with gait  PATIENT GOALS: ".... to get this lymphedema under control. It's embarrassing. That's why I wear long skirts."   OBJECTIVE:   Mild, Stage  II, Bilateral Lower Extremity Lymphedema 2/2 CVI and Obesity  OBSERVATIONS / OTHER ASSESSMENTS:   Lymphedema Life Impact Scape (LLIS): Intake 11/03/22: 50% (The extent to which L:E-related problems affected your life during the past week)  FOTO functional outcome scale: Intake 11/16/22: 63%  BLE COMPARATIVE LIMB VOLUMETRICS:   Intake measurements completed 10/22/22  LANDMARK RIGHT   R LEG (A-D) 5043.4 ml  R THIGH (E-G) 4182.3 ml  R FULL LIMB (A-G) 7643.8 ml  Limb Volume differential (LVD)   Leg LVD =12.14%, L>R Thigh LVD 8.3%, L>R Full limb LVD= 8.3%   Volume change since initial %  Volume change overall V  (Blank rows = not tested)  LANDMARK LEFT    R LEG (A-D) 5656.0 ml  R THIGH (E-G) 4529.5 ml  R FULL LIMB (A-G) 10185.5 ml  Limb Volume differential (LVD)  %  Volume change since initial %  Volume change overall %  (Blank rows = not tested)   9th Visit: 12/07/22  [facility] RIGHT   R LEG (A-D) 5116.3 ml  R THIGH (E-G) 5047.6  ml  R FULL LIMB (A-G) 10163.9  ml  Limb Volume differential (LVD)    Volume change since initial R LEG volume INCREASED by 1.4%;  R THIGH INCREASED by 20.7%, and R  FULL LIMB INCREASED by 10.17% since initial on 10/22/22.  Volume change overall V  (Blank rows = not tested)       19 th visit 01/18/23  LANDMARK RIGHT   R LEG (A-D) 4606.5 ml  R THIGH (E-G) 4617.0 ml  R FULL LIMB (A-G) 9224 ml  Limb Volume differential (LVD)    Volume change since initial Since last measured on 9th Rx visit on 12/07/22, R LEG volume is DECREASED by 9.96 %, R THIGH volume is DECREASED by 8.5 %, and R FULL LIMB volume is DECREASED by 9.25 %   Volume change overall Since initially measured on 10/22/22 on 1st Rx visit,  R LEG volume is DECREASED by 8.67 % overall, R THIGH volume is INCREASED by 10 %, and R FULL LIMB volume is DECREASED by 0.018 %    29 th Visit  03/02/23   LANDMARK RIGHT   R LEG (A-D) 5749.7 ml  R THIGH (E-G)  ml  R FULL LIMB (A-G)  ml  Limb Volume differential (LVD)    Volume change since last Since 01/18/23 R leg  is INCREASED by 15.15%.  Volume change overall   (Blank rows = not tested)  LANDMARK LEFT    L LEG (A-D) Since initial 11/11/22 L LEG is INCREASED by 1.66%.   L THIGH (E-G)   L FULL LIMB (A-G)   Limb Volume differential (LVD)    Volume change since initial 1.66%  Volume change overall      (Blank rows = not tested)  TODAY'S TREATMENT:  LLE/LLQ MLD as established LLE multilayer gradient compression wraps-knee high Pt edu for LE self-care and reviewed progress towards goals to date                  PATIENT EDUCATION:  Pt edu for LE self care:  Continued Pt/ CG edu for lymphedema self care home program throughout session. Emphasis today on lymphedema precautions and cellulitis ID, prevention and Rx. Discussed again fluid dynamics re elevation, and importance of elevation for lymphedema management and venous return. Other topics include outcome of comparative limb volumetrics- starting limb volume differentials (LVDs), technology and gradient techniques used for short stretch, multilayer compression wrapping, simple self-MLD, therapeutic lymphatic  pumping exercises, skin/nail care, LE precautions,. compression garment recommendations and specifications, wear and care schedule and compression garment donning / doffing w assistive devices. Discussed progress towards all OT goals since commencing CDT. All questions answered to the Pt's satisfaction. Good return. Person educated: Patient Education method: Explanation, Demonstration, Tactile cues, Verbal cues, and Handouts Education comprehension: verbalized understanding, returned demonstration, and needs further education  HOME EXERCISE PROGRAM: Intensive Phase Complete Decongestive Therapy (CDT) includes Daily Manual lymphatic drainage: Short neck sequence bilaterally w diaphragmatic breathing to engage deep abdominal lymphatics. Stationary J strokes to inguinal LN, then proximal to distal dynamic J strokes to thigh, "bottleneck" region, leg and foot. Finally 3 retrograde sweeps back to terminus. Daily Skin care w/ low ph lotion matching skin ph to limit infection risk and improve tissue flexibility and excursion Daily, BLE lymphatic pumping therex- seated or supine Daily (23/7) multilayer compression bandages below the knee to reduce limb volume, one limb at a time (One each 8 cm, 10 cm and 12 cm wide x 5 meters long short stretch compression wrap applied circumferentially using gradient layering techniques over single layer of cotton stockinett an Rosidal foam- toes to popliteal fossa  Self-management Phase CDT includes: All of the above and Appropriate daytime compression garments and HOS devices full time daily. Replace q 3-6 months Elvarex, flat knit, ccl 225-32 mmHg) knee highs w open toe, t heel, oblique top edge, 2.5 cm wide SB on top, tricot patch at anterior ankle sewn on all 4 sides. To be work full time waking hours. Jobst RELAX knee high HOS device  Custom-made gradient compression garments and HOS devices are medically necessary in this case because they are uniquely sized and  shaped to fit the exact dimensions of the affected extremities with deformities, and to provide accurate and consistent gradient compression and containment, essential to optimally managing this patient's symptoms of chronic, progressive lymphedema. Multiple custom compression garments are needed for optimal hygiene to limit infection risk. Custom compression garments should be replaced q 3-6 months When worn consistently for optimal lipo-lymphedema self-management over time.   ASSESSMENT:  CLINICAL IMPRESSION: LLE skin condition continues  to improve as hyperkeratosis at distal leg slowly recedes. Volume reduction fibrosis also slowly but steadily reducing.   Pt tolerated MLD  with simultaneous skin care w emphasis on leg and ankle without increased pain.  Cont as per POC.    OBJECTIVE IMPAIRMENTS: low vision, Abnormal gait, decreased activity tolerance, decreased balance, decreased endurance, decreased knowledge of condition, decreased knowledge of use of DME, decreased mobility, decreased ROM, increased edema, impaired flexibility, postural dysfunction, obesity, and pain.   ACTIVITY LIMITATIONS: functional mobility and ambulation (carrying, lifting, bending, sitting, standing, squatting, sleeping, stairs, transfers, bed mobility, continence, Basic and instrumental ADLs (bathing, dressing, hygiene/grooming, Productive activities ( caring for others) and leisure pursuits, body image, and social participation  PERSONAL FACTORS:  multiple co morbidities, length of time with progressive condition, are also affecting patient's functional outcome.   REHAB POTENTIAL: Fair    EVALUATION COMPLEXITY: Moderate   GOALS: Goals reviewed with patient? Yes  SHORT TERM GOALS: Target date: 4th OT Rx visit   Pt will demonstrate understanding of lymphedema precautions and prevention strategies with modified independence using a printed reference to identify at least 5 precautions and discussing how s/he may  implement them into daily life to reduce risk of progression with extra time (Modified Independence). Baseline:Max A Goal status: 12/07/22 GOAL MET  2.  Pt will be able to apply multilayer, thigh length, gradient, compression wraps to one leg at a time with modified assistance (extra time) to decrease limb volume, to limit infection risk, and to limit lymphedema progression.  Baseline: Dependent Goal status: 11/16/22 PROGRESSING 11/26/22 GOAL MET  LONG TERM GOALS: Target date: 02/01/23  Given this patient's Intake score of 63/100% on the functional outcomes FOTO tool, patient will experience an increase in function of 3 points to improve basic and instrumental ADLs performance, including lymphedema self-care.  Baseline: ONGOING   Goal status:12/07/22 GOAL ONGOING,  01/20/23 PROGRESSING 03/03/23 PROGRESSING   2.  Given this patient's Intake score of 50.0 % on the Lymphedema Life Impact Scale (LLIS), patient will experience a reduction of at least 5 points in her perceived level of functional impairment resulting from lymphedema to improve functional performance and quality of life (QOL). Baseline: 50% Goal status: 12/07/22 GOAL ONGOING; 01/20/23 PROGRESSING,  03/03/23 PROGRESSING  3.  During Intensive phase CDT Pt will achieve at least 85% compliance with all lymphedema self-care home program components, including daily skin care, compression wraps and /or garments, simple self MLD and lymphatic pumping therex, and elevation when sitting to habituate LE self care protocol  into ADLs for optimal LE self-management over time. Baseline: Dependent Goal status: 12/08/22 ONGOING. Pt not changing wraps daily as directed, but will work on increasing elevation anytime she is seated for next progress report. Pt will also increase frequency of re-wrapping her leg to ensure optimal volume reduction. GOAL MET 01/20/23  4.  Pt will achieve at least a 10% volume reduction below the knees to return limb to typical size and  shape, to limit infection risk and LE progression, to decrease pain, to improve function. Baseline: dependent Goal status: 12/07/22 PROGRESSING 01/18/23 : Leg reduction= 8.67% since initial. Thigh  is Increased 10% since initial, And full LE is decreased by 0.018% since 11/11/22;  01/20/23 GOAL MET for L LEG w 9.96 % reduction . 03/01/23 BLE comparative limb volumetrics reveal a  significant 15.15% increase in limb volume since last measured on 01/18/23. The LLE , the current Rx limb, is increased by 1.66% since initially measured in 4/24.  See volumetric chart for additional details.  5.  Pt will obtain proper compression garments/compression braces and be modified independent with donning/doffing to optimize/stabilize with fluid reduction. Baseline:  Goal status: 12/07/22 PROGRESSING. 01/20/23 PROGRESSING.02/08/23 GOAL MET for RLE  PLAN:  PT FREQUENCY: 2x/week  PT DURATION: 12 weeks  PLANNED INTERVENTIONS: Therapeutic exercises, Therapeutic activity, Patient/Family education, Self Care, Manual lymph drainage, Compression bandaging, scar mobilization, Manual therapy, and skin care throughout MLD  PLAN FOR NEXT SESSION:  MLD and simultaneous skin care to LLE LLE multilayer compression wraps as established Cont Pt edu for LE self care   Loel Dubonnet, MS, OTR/L, CLT-LANA 03/24/23 12:46 PM`

## 2023-03-25 ENCOUNTER — Encounter: Payer: Medicare Other | Admitting: Occupational Therapy

## 2023-03-25 DIAGNOSIS — E1122 Type 2 diabetes mellitus with diabetic chronic kidney disease: Secondary | ICD-10-CM | POA: Diagnosis not present

## 2023-03-25 DIAGNOSIS — D631 Anemia in chronic kidney disease: Secondary | ICD-10-CM | POA: Diagnosis not present

## 2023-03-25 DIAGNOSIS — N1832 Chronic kidney disease, stage 3b: Secondary | ICD-10-CM | POA: Diagnosis not present

## 2023-03-25 DIAGNOSIS — E871 Hypo-osmolality and hyponatremia: Secondary | ICD-10-CM | POA: Diagnosis not present

## 2023-03-25 DIAGNOSIS — N2581 Secondary hyperparathyroidism of renal origin: Secondary | ICD-10-CM | POA: Diagnosis not present

## 2023-03-25 DIAGNOSIS — I129 Hypertensive chronic kidney disease with stage 1 through stage 4 chronic kidney disease, or unspecified chronic kidney disease: Secondary | ICD-10-CM | POA: Diagnosis not present

## 2023-03-25 DIAGNOSIS — I890 Lymphedema, not elsewhere classified: Secondary | ICD-10-CM | POA: Diagnosis not present

## 2023-03-29 ENCOUNTER — Ambulatory Visit: Payer: Medicare Other | Admitting: Occupational Therapy

## 2023-03-29 ENCOUNTER — Telehealth: Payer: Self-pay

## 2023-03-29 DIAGNOSIS — I890 Lymphedema, not elsewhere classified: Secondary | ICD-10-CM | POA: Diagnosis not present

## 2023-03-29 DIAGNOSIS — I10 Essential (primary) hypertension: Secondary | ICD-10-CM

## 2023-03-29 LAB — BASIC METABOLIC PANEL
BUN: 19 (ref 4–21)
CO2: 25 — AB (ref 13–22)
Chloride: 105 (ref 99–108)
Creatinine: 1.3 — AB (ref 0.5–1.1)
Glucose: 87
Potassium: 4 meq/L (ref 3.5–5.1)
Sodium: 141 (ref 137–147)

## 2023-03-29 LAB — COMPREHENSIVE METABOLIC PANEL
Albumin: 4.2 (ref 3.5–5.0)
Calcium: 9.5 (ref 8.7–10.7)
eGFR: 44

## 2023-03-29 LAB — CBC AND DIFFERENTIAL: Hemoglobin: 9.8 — AB (ref 12.0–16.0)

## 2023-03-29 NOTE — Patient Outreach (Signed)
  Care Coordination   Initial Visit Note   03/29/2023 Name: Emily Wood MRN: [id number] DOB: 1951-02-01  Emily Wood is a 72 y.o. year old female who sees Copland, Gwenlyn Found, MD for primary care. I spoke with  Emily Wood by phone today.  What matters to the patients health and wellness today?  RNCM called to discuss care coordination program. Emily Wood reports history of HTN reports has a BP cuff, but needs a larger size. Encouraged to contact insurance provider to discuss OTC benefit with them and the need for a larger size cuff. History of Lymphedema-she reports is managed by Lymphedema clinic/therapy. History of diabetes-does not check blood sugar, but reports is on Ozempic. A1C 6.0 on 01/04/23. She denies disease education support. She expressed Resource needs- would like assistance with finding transportation resources to get to provider appointments. She states she lives in [place] and has appointments in [place] and [place]. She states she cannot afford Gemtesa-reports $130 for ?90 day supply. She is receptive to referral to clinical pharmacist and care guide.  Goals Addressed             This Visit's Progress    Community resources       Interventions Today    Flowsheet Row Most Recent Value  Chronic Disease   Chronic disease during today's visit Other, Hypertension (HTN), Diabetes, Chronic Kidney Disease/End Stage Renal Disease (ESRD)  [lymphedema]  General Interventions   General Interventions Discussed/Reviewed General Interventions Discussed, [facility], Doctor Visits, Communication with  Doctor Visits Discussed/Reviewed Doctor Visits Discussed, PCP, Specialist  PCP/Specialist Visits Compliance with follow-up visit  [reviewed upcoming provider visits]  Communication with Pharmacists  [re: patient inability to afford Gemtesa]  Education Interventions   Education Provided Provided Education  Provided Verbal Education On  Medication, Insurance Plans  [advised to take medications as prescribed, advised to attend provider visits as scheduled, advised to contact provider with health questions or concerns]  Pharmacy Interventions   Pharmacy Dicussed/Reviewed Pharmacy Topics Discussed            SDOH assessments and interventions completed:  Yes  SDOH Interventions Today    Flowsheet Row Most Recent Value  SDOH Interventions   Food Insecurity Interventions Intervention Not Indicated  Housing Interventions Intervention Not Indicated  Transportation Interventions Other (Comment)  [referral to care guide]  Utilities Interventions Other (Comment)  [referral to care guide]     Care Coordination Interventions:  Yes, provided   Follow up plan: Follow up call scheduled for 04/21/23    Encounter Outcome:  Patient Visit Completed   Kathyrn Sheriff, RN, MSN, BSN, CCM Care Management Coordinator [phone]

## 2023-03-29 NOTE — Patient Instructions (Signed)
Visit Information  Thank you for taking time to visit with me today. Please don't hesitate to contact me if I can be of assistance to you.   Following are the goals we discussed today:  Continue to take medications as prescribed. Continue to attend provider visits as scheduled Contact your insurance provider regarding Over the counter benefits and assistance with getting right size BP cuff for you. Contact your provider for health questions/concerns as needed  Our next appointment is by telephone on 04/21/23 at 1:15 pm  Please call the care guide team at [phone] if you need to cancel or reschedule your appointment.   If you are experiencing a Mental Health or Behavioral Health Crisis or need someone to talk to, please call the Suicide and Crisis Lifeline: 988 call the [place] National Suicide Prevention Lifeline: (361)053-8425 or TTY: 574-789-5179 TTY 779-830-1033) to talk to a trained counselor call 1-800-273-TALK (toll free, 24 hour hotline)  Kathyrn Sheriff, RN, MSN, BSN, CCM Care Management Coordinator [phone]

## 2023-03-29 NOTE — Therapy (Signed)
+- OUTPATIENT OCCUPATIONAL THERAPY TREATMENT NOTE  LOWER EXTREMITY LYMPHEDEMA Patient Name: Emily Wood MRN: [id number] DOB:Jun 25, 1951, 72 y.o., female Today's Date: 03/29/2023  END OF SESSION:   OT End of Session - 03/29/23 0809     Visit Number 36    Number of Visits 72    Date for OT Re-Evaluation 04/20/23    OT Start Time 0808    OT Stop Time 0900    OT Time Calculation (min) 52 min    Activity Tolerance Patient tolerated treatment well;No increased pain    Behavior During Therapy [facility] for tasks assessed/performed               Past Medical History:  Diagnosis Date   Arthritis    Diabetes mellitus without complication (HCC)    Family history of adverse reaction to anesthesia    PONV   Graves disease 04/14/2020   Hypertension    Lymphedema    Mixed hyperlipidemia 02/20/2021   Past Surgical History:  Procedure Laterality Date   CATARACT EXTRACTION W/PHACO Right 04/14/2022   Procedure: CATARACT EXTRACTION PHACO AND INTRAOCULAR LENS PLACEMENT (IOC) RIGHT DIABETIC 10.93 00:53.0;  Surgeon: Galen Manila, MD;  Location: [facility];  Service: Ophthalmology;  Laterality: Right;   CATARACT EXTRACTION W/PHACO Left 04/28/2022   Procedure: CATARACT EXTRACTION PHACO AND INTRAOCULAR LENS PLACEMENT (IOC) LEFT DIABETIC 6.05 00:45.7;  Surgeon: Galen Manila, MD;  Location: [facility];  Service: Ophthalmology;  Laterality: Left;  Diabetic   CHOLECYSTECTOMY     HERNIA REPAIR     OTHER SURGICAL HISTORY     scar tissue removal from bowels   PARTIAL HYSTERECTOMY     Still has ovaries   REVERSE SHOULDER ARTHROPLASTY Right 09/11/2022   Procedure: REVERSE SHOULDER ARTHROPLASTY;  Surgeon: Yolonda Kida, MD;  Location: [facility];  Service: Orthopedics;  Laterality: Right;  120   UTERINE FIBROID SURGERY     Patient Active Problem List   Diagnosis Date Noted   Normocytic anemia 05/23/2022   AKI (acute kidney injury) (HCC) 05/21/2022   Snoring  11/05/2021   Daytime somnolence 11/05/2021   Coronary artery disease involving native coronary artery of native heart without angina pectoris 05/15/2021   Nonrheumatic tricuspid valve regurgitation 05/15/2021   Pulmonary hypertension, unspecified (HCC) 05/15/2021   Mixed hyperlipidemia 02/20/2021   Obesity (BMI 30-39.9) 02/20/2021   Diabetic retinopathy (HCC) 08/28/2020   Mixed incontinence 05/31/2020   Graves disease 04/14/2020   Right renal mass 11/22/2019   Arthritis of left knee 09/25/2019   Lymphedema 01/25/2017   Fibroid uterus 01/14/2017   Chronic kidney disease, stage 3 (HCC) 09/21/2016   Meralgia paresthetica of left side 08/27/2015   Diabetes mellitus type 2, controlled (HCC) 08/02/2015   Chronic venous insufficiency 06/28/2015   Essential hypertension 02/15/2015   Peripheral edema 02/15/2015   Chronic knee pain 02/15/2015    PCP: Pearline Cables, MD  REFERRING PROVIDER: Sheppard Plumber, NP  REFERRING DIAG: I89.0  THERAPY DIAG:  Lymphedema, not elsewhere classified  Rationale for Evaluation and Treatment: Rehabilitation  ONSET DATE: insidious onset >30 years  SUBJECTIVE:  SUBJECTIVE STATEMENT: Emily Wood presents to Occupational Therapy for treatment of BLE lymphedema. She arrives with knee length, multilayer compression wraps in place on RLE and compression stocking in place on the L.  She is unaccompanied and walks to clinic. Pt denies LE-related leg pain, but endorses pain n her R eye and OA related R knee pain 7/10, despite recent gel injection. Pt has eye doctor appointment tomorrow. She has not yet called her orthopedic doctor.  PERTINENT HISTORY:  Medical Hx contributing to, or affected by chronic, progressive lymphedema: Norvasc prescription-common side effect is leg  swelling; HTN, CVI, Obesity, CAD, OA knees, CKD stage 3, Graves Disease, DM  PAIN:  Are you having pain? Yes: R arthritis knee pain;  7/10 Pain description: stiff, aching, throbbing, tight, heavy Aggravating factors: standing , walking,  Relieving factors: "The medication from my cardiologist", elevation, compression stockings ( Pt has good hip AROM bilaterally and is able to don them herself w extra time in clinic.)  PRECAUTIONS: Fall and Other: LYMPHEDEMA PRECAUTIONS: (CKD, CAD and DM, GRAVES )   WEIGHT BEARING RESTRICTIONS: No  FALLS:  Has patient fallen since last visit? NO    HAND DOMINANCE: right   PRIOR LEVEL OF FUNCTION: Requires assistive device for independence, Needs assistance with homemaking, and Needs assistance with gait  PATIENT GOALS: ".... to get this lymphedema under control. It's embarrassing. That's why I wear long skirts."   OBJECTIVE:   Mild, Stage  II, Bilateral Lower Extremity Lymphedema 2/2 CVI and Obesity  OBSERVATIONS / OTHER ASSESSMENTS:   Lymphedema Life Impact Scape (LLIS): Intake 11/03/22: 50% (The extent to which L:E-related problems affected your life during the past week)  FOTO functional outcome scale: Intake 11/16/22: 63%  BLE COMPARATIVE LIMB VOLUMETRICS:   Intake measurements completed 10/22/22  LANDMARK RIGHT   R LEG (A-D) 5043.4 ml  R THIGH (E-G) 4182.3 ml  R FULL LIMB (A-G) 7643.8 ml  Limb Volume differential (LVD)   Leg LVD =12.14%, L>R Thigh LVD 8.3%, L>R Full limb LVD= 8.3%   Volume change since initial %  Volume change overall V  (Blank rows = not tested)  LANDMARK LEFT    R LEG (A-D) 5656.0 ml  R THIGH (E-G) 4529.5 ml  R FULL LIMB (A-G) 10185.5 ml  Limb Volume differential (LVD)  %  Volume change since initial %  Volume change overall %  (Blank rows = not tested)   9th Visit: 12/07/22  [facility] RIGHT   R LEG (A-D) 5116.3 ml  R THIGH (E-G) 5047.6  ml  R FULL LIMB (A-G) 10163.9  ml  Limb Volume differential (LVD)     Volume change since initial R LEG volume INCREASED by 1.4%;  R THIGH INCREASED by 20.7%, and R FULL LIMB INCREASED by 10.17% since initial on 10/22/22.  Volume change overall V  (Blank rows = not tested)       19 th visit 01/18/23  LANDMARK RIGHT   R LEG (A-D) 4606.5 ml  R THIGH (E-G) 4617.0 ml  R FULL LIMB (A-G) 9224 ml  Limb Volume differential (LVD)    Volume change since initial Since last measured on 9th Rx visit on 12/07/22, R LEG volume is DECREASED by 9.96 %, R THIGH volume is DECREASED by 8.5 %, and R FULL LIMB volume is DECREASED by 9.25 %   Volume change overall Since initially measured on 10/22/22 on 1st Rx visit,  R LEG volume is DECREASED by 8.67 % overall, R THIGH volume is INCREASED by  10 %, and R FULL LIMB volume is DECREASED by 0.018 %    29 th Visit 03/02/23   LANDMARK RIGHT   R LEG (A-D) 5749.7 ml  R THIGH (E-G)  ml  R FULL LIMB (A-G)  ml  Limb Volume differential (LVD)    Volume change since last Since 01/18/23 R leg  is INCREASED by 15.15%.  Volume change overall   (Blank rows = not tested)  LANDMARK LEFT    L LEG (A-D) Since initial 11/11/22 L LEG is INCREASED by 1.66%.   L THIGH (E-G)   L FULL LIMB (A-G)   Limb Volume differential (LVD)    Volume change since initial 1.66%  Volume change overall      (Blank rows = not tested)  TODAY'S TREATMENT:  LLE/LLQ MLD as established LLE multilayer gradient compression wraps-knee high Pt edu for LE self-care and reviewed progress towards goals to date                  PATIENT EDUCATION:  Pt edu for LE self care:  Continued Pt/ CG edu for lymphedema self care home program throughout session. Emphasis today on lymphedema precautions and cellulitis ID, prevention and Rx. Discussed again fluid dynamics re elevation, and importance of elevation for lymphedema management and venous return. Other topics include outcome of comparative limb volumetrics- starting limb volume differentials (LVDs), technology and gradient  techniques used for short stretch, multilayer compression wrapping, simple self-MLD, therapeutic lymphatic pumping exercises, skin/nail care, LE precautions,. compression garment recommendations and specifications, wear and care schedule and compression garment donning / doffing w assistive devices. Discussed progress towards all OT goals since commencing CDT. All questions answered to the Pt's satisfaction. Good return. Person educated: Patient Education method: Explanation, Demonstration, Tactile cues, Verbal cues, and Handouts Education comprehension: verbalized understanding, returned demonstration, and needs further education  HOME EXERCISE PROGRAM: Intensive Phase Complete Decongestive Therapy (CDT) includes Daily Manual lymphatic drainage: Short neck sequence bilaterally w diaphragmatic breathing to engage deep abdominal lymphatics. Stationary J strokes to inguinal LN, then proximal to distal dynamic J strokes to thigh, "bottleneck" region, leg and foot. Finally 3 retrograde sweeps back to terminus. Daily Skin care w/ low ph lotion matching skin ph to limit infection risk and improve tissue flexibility and excursion Daily, BLE lymphatic pumping therex- seated or supine Daily (23/7) multilayer compression bandages below the knee to reduce limb volume, one limb at a time (One each 8 cm, 10 cm and 12 cm wide x 5 meters long short stretch compression wrap applied circumferentially using gradient layering techniques over single layer of cotton stockinett an Rosidal foam- toes to popliteal fossa  Self-management Phase CDT includes: All of the above and Appropriate daytime compression garments and HOS devices full time daily. Replace q 3-6 months Elvarex, flat knit, ccl 225-32 mmHg) knee highs w open toe, t heel, oblique top edge, 2.5 cm wide SB on top, tricot patch at anterior ankle sewn on all 4 sides. To be work full time waking hours. Jobst RELAX knee high HOS device  Custom-made gradient  compression garments and HOS devices are medically necessary in this case because they are uniquely sized and shaped to fit the exact dimensions of the affected extremities with deformities, and to provide accurate and consistent gradient compression and containment, essential to optimally managing this patient's symptoms of chronic, progressive lymphedema. Multiple custom compression garments are needed for optimal hygiene to limit infection risk. Custom compression garments should be replaced q 3-6 months When  worn consistently for optimal lipo-lymphedema self-management over time.   ASSESSMENT:  CLINICAL IMPRESSION: LLE skin condition continues to improve as hyperkeratosis at distal leg slowly recedes. Volume reduction fibrosis also slowly but steadily reducing.   Pt tolerated MLD  with simultaneous skin care w emphasis on leg and ankle without increased pain. Pt agrees with plan to complete LLE for custom garment in the next few sessions. Cont as per POC.   OBJECTIVE IMPAIRMENTS: low vision, Abnormal gait, decreased activity tolerance, decreased balance, decreased endurance, decreased knowledge of condition, decreased knowledge of use of DME, decreased mobility, decreased ROM, increased edema, impaired flexibility, postural dysfunction, obesity, and pain.   ACTIVITY LIMITATIONS: functional mobility and ambulation (carrying, lifting, bending, sitting, standing, squatting, sleeping, stairs, transfers, bed mobility, continence, Basic and instrumental ADLs (bathing, dressing, hygiene/grooming, Productive activities ( caring for others) and leisure pursuits, body image, and social participation  PERSONAL FACTORS:  multiple co morbidities, length of time with progressive condition, are also affecting patient's functional outcome.   REHAB POTENTIAL: Fair    EVALUATION COMPLEXITY: Moderate   GOALS: Goals reviewed with patient? Yes  SHORT TERM GOALS: Target date: 4th OT Rx visit   Pt will  demonstrate understanding of lymphedema precautions and prevention strategies with modified independence using a printed reference to identify at least 5 precautions and discussing how s/he may implement them into daily life to reduce risk of progression with extra time (Modified Independence). Baseline:Max A Goal status: 12/07/22 GOAL MET  2.  Pt will be able to apply multilayer, thigh length, gradient, compression wraps to one leg at a time with modified assistance (extra time) to decrease limb volume, to limit infection risk, and to limit lymphedema progression.  Baseline: Dependent Goal status: 11/16/22 PROGRESSING 11/26/22 GOAL MET  LONG TERM GOALS: Target date: 02/01/23  Given this patient's Intake score of 63/100% on the functional outcomes FOTO tool, patient will experience an increase in function of 3 points to improve basic and instrumental ADLs performance, including lymphedema self-care.  Baseline: ONGOING   Goal status:12/07/22 GOAL ONGOING,  01/20/23 PROGRESSING 03/03/23 PROGRESSING   2.  Given this patient's Intake score of 50.0 % on the Lymphedema Life Impact Scale (LLIS), patient will experience a reduction of at least 5 points in her perceived level of functional impairment resulting from lymphedema to improve functional performance and quality of life (QOL). Baseline: 50% Goal status: 12/07/22 GOAL ONGOING; 01/20/23 PROGRESSING,  03/03/23 PROGRESSING  3.  During Intensive phase CDT Pt will achieve at least 85% compliance with all lymphedema self-care home program components, including daily skin care, compression wraps and /or garments, simple self MLD and lymphatic pumping therex, and elevation when sitting to habituate LE self care protocol  into ADLs for optimal LE self-management over time. Baseline: Dependent Goal status: 12/08/22 ONGOING. Pt not changing wraps daily as directed, but will work on increasing elevation anytime she is seated for next progress report. Pt will also increase  frequency of re-wrapping her leg to ensure optimal volume reduction. GOAL MET 01/20/23  4.  Pt will achieve at least a 10% volume reduction below the knees to return limb to typical size and shape, to limit infection risk and LE progression, to decrease pain, to improve function. Baseline: dependent Goal status: 12/07/22 PROGRESSING 01/18/23 : Leg reduction= 8.67% since initial. Thigh  is Increased 10% since initial, And full LE is decreased by 0.018% since 11/11/22;  01/20/23 GOAL MET for L LEG w 9.96 % reduction . 03/01/23 BLE comparative limb volumetrics  reveal a  significant 15.15% increase in limb volume since last measured on 01/18/23. The LLE , the current Rx limb, is increased by 1.66% since initially measured in 4/24.  See volumetric chart for additional details.  5.  Pt will obtain proper compression garments/compression braces and be modified independent with donning/doffing to optimize/stabilize with fluid reduction. Baseline:  Goal status: 12/07/22 PROGRESSING. 01/20/23 PROGRESSING.02/08/23 GOAL MET for RLE  PLAN:  PT FREQUENCY: 2x/week  PT DURATION: 12 weeks  PLANNED INTERVENTIONS: Therapeutic exercises, Therapeutic activity, Patient/Family education, Self Care, Manual lymph drainage, Compression bandaging, scar mobilization, Manual therapy, and skin care throughout MLD  PLAN FOR NEXT SESSION:  MLD and simultaneous skin care to LLE LLE multilayer compression wraps as established Cont Pt edu for LE self care   Loel Dubonnet, MS, OTR/L, CLT-LANA 03/29/23 9:02 AM`

## 2023-03-30 ENCOUNTER — Telehealth: Payer: Self-pay | Admitting: *Deleted

## 2023-03-30 DIAGNOSIS — H30002 Unspecified focal chorioretinal inflammation, left eye: Secondary | ICD-10-CM | POA: Diagnosis not present

## 2023-03-30 DIAGNOSIS — H35352 Cystoid macular degeneration, left eye: Secondary | ICD-10-CM | POA: Diagnosis not present

## 2023-03-30 NOTE — Progress Notes (Signed)
  Care Coordination  Outreach Note  03/30/2023 Name: Emily Wood MRN: [id number] DOB: 1951/06/14   Care Coordination Outreach Attempts: An unsuccessful telephone outreach was attempted today to offer the patient information about available care coordination services.  Follow Up Plan:  Additional outreach attempts will be made to offer the patient care coordination information and services.   Encounter Outcome:  No Answer  Burman Nieves, CCMA Care Coordination Care Guide Direct Dial: [phone]

## 2023-03-31 ENCOUNTER — Ambulatory Visit: Payer: Medicare Other | Admitting: Occupational Therapy

## 2023-03-31 ENCOUNTER — Telehealth: Payer: Self-pay

## 2023-03-31 DIAGNOSIS — I890 Lymphedema, not elsewhere classified: Secondary | ICD-10-CM

## 2023-03-31 NOTE — Progress Notes (Signed)
  Care Coordination  Outreach Note  03/31/2023 Name: Emily Wood MRN: [id number] DOB: [birth year]   Care Coordination Outreach Attempts: A second unsuccessful outreach was attempted today to offer the patient with information about available care coordination services.  Follow Up Plan:  Additional outreach attempts will be made to offer the patient care coordination information and services.   Encounter Outcome:  No Answer  Burman Nieves, CCMA Care Coordination Care Guide Direct Dial: [phone]

## 2023-03-31 NOTE — Therapy (Signed)
+- OUTPATIENT OCCUPATIONAL THERAPY TREATMENT NOTE  LOWER EXTREMITY LYMPHEDEMA Patient Name: Emily Wood MRN: [id number] DOB:07/19/1951, 72 y.o., female Today's Date: 03/31/2023  END OF SESSION:   OT End of Session - 03/31/23 0804     Visit Number 37    Number of Visits 72    Date for OT Re-Evaluation 04/20/23    OT Start Time 0804    OT Stop Time 0910    OT Time Calculation (min) 66 min    Activity Tolerance Patient tolerated treatment well;No increased pain    Behavior During Therapy [facility] for tasks assessed/performed               Past Medical History:  Diagnosis Date   Arthritis    Diabetes mellitus without complication (HCC)    Family history of adverse reaction to anesthesia    PONV   Graves disease 04/14/2020   Hypertension    Lymphedema    Mixed hyperlipidemia 02/20/2021   Past Surgical History:  Procedure Laterality Date   CATARACT EXTRACTION W/PHACO Right 04/14/2022   Procedure: CATARACT EXTRACTION PHACO AND INTRAOCULAR LENS PLACEMENT (IOC) RIGHT DIABETIC 10.93 00:53.0;  Surgeon: Galen Manila, MD;  Location: [facility];  Service: Ophthalmology;  Laterality: Right;   CATARACT EXTRACTION W/PHACO Left 04/28/2022   Procedure: CATARACT EXTRACTION PHACO AND INTRAOCULAR LENS PLACEMENT (IOC) LEFT DIABETIC 6.05 00:45.7;  Surgeon: Galen Manila, MD;  Location: [facility];  Service: Ophthalmology;  Laterality: Left;  Diabetic   CHOLECYSTECTOMY     HERNIA REPAIR     OTHER SURGICAL HISTORY     scar tissue removal from bowels   PARTIAL HYSTERECTOMY     Still has ovaries   REVERSE SHOULDER ARTHROPLASTY Right 09/11/2022   Procedure: REVERSE SHOULDER ARTHROPLASTY;  Surgeon: Yolonda Kida, MD;  Location: [facility];  Service: Orthopedics;  Laterality: Right;  120   UTERINE FIBROID SURGERY     Patient Active Problem List   Diagnosis Date Noted   Normocytic anemia 05/23/2022   AKI (acute kidney injury) (HCC) 05/21/2022   Snoring  11/05/2021   Daytime somnolence 11/05/2021   Coronary artery disease involving native coronary artery of native heart without angina pectoris 05/15/2021   Nonrheumatic tricuspid valve regurgitation 05/15/2021   Pulmonary hypertension, unspecified (HCC) 05/15/2021   Mixed hyperlipidemia 02/20/2021   Obesity (BMI 30-39.9) 02/20/2021   Diabetic retinopathy (HCC) 08/28/2020   Mixed incontinence 05/31/2020   Graves disease 04/14/2020   Right renal mass 11/22/2019   Arthritis of left knee 09/25/2019   Lymphedema 01/25/2017   Fibroid uterus 01/14/2017   Chronic kidney disease, stage 3 (HCC) 09/21/2016   Meralgia paresthetica of left side 08/27/2015   Diabetes mellitus type 2, controlled (HCC) 08/02/2015   Chronic venous insufficiency 06/28/2015   Essential hypertension 02/15/2015   Peripheral edema 02/15/2015   Chronic knee pain 02/15/2015    PCP: Pearline Cables, MD  REFERRING PROVIDER: Sheppard Plumber, NP  REFERRING DIAG: I89.0  THERAPY DIAG:  Lymphedema, not elsewhere classified  Rationale for Evaluation and Treatment: Rehabilitation  ONSET DATE: insidious onset >30 years  SUBJECTIVE:  SUBJECTIVE STATEMENT: [name] presents to Occupational Therapy for treatment of BLE lymphedema. She arrives with knee length, multilayer compression wraps in place on LLE and compression stocking in place on the R.  She is unaccompanied and walks to clinic. Pt denies LE-related leg pain, but endorses pain n her R eye and OA related R knee pain 7/10, despite recent gel injection. Pt states eye pain is some better since restarting steroid.   PERTINENT HISTORY:  Medical Hx contributing to, or affected by chronic, progressive lymphedema: Norvasc prescription-common side effect is leg swelling; HTN, CVI, Obesity,  CAD, OA knees, CKD stage 3, Graves Disease, DM  PAIN:  Are you having pain? Yes: R arthritis knee pain;  7/10 Pain description: stiff, aching, throbbing, tight, heavy Aggravating factors: standing , walking,  Relieving factors: "The medication from my cardiologist", elevation, compression stockings ( Pt has good hip AROM bilaterally and is able to don them herself w extra time in clinic.)  PRECAUTIONS: Fall and Other: LYMPHEDEMA PRECAUTIONS: (CKD, CAD and DM, GRAVES )   WEIGHT BEARING RESTRICTIONS: No  FALLS:  Has patient fallen since last visit? NO    HAND DOMINANCE: right   PRIOR LEVEL OF FUNCTION: Requires assistive device for independence, Needs assistance with homemaking, and Needs assistance with gait  PATIENT GOALS: ".... to get this lymphedema under control. It's embarrassing. That's why I wear long skirts."   OBJECTIVE:   Mild, Stage  II, Bilateral Lower Extremity Lymphedema 2/2 CVI and Obesity  OBSERVATIONS / OTHER ASSESSMENTS:   Lymphedema Life Impact Scape (LLIS): Intake 11/03/22: 50% (The extent to which L:E-related problems affected your life during the past week)  FOTO functional outcome scale: Intake 11/16/22: 63%  BLE COMPARATIVE LIMB VOLUMETRICS:   Intake measurements completed 10/22/22  LANDMARK RIGHT   R LEG (A-D) 5043.4 ml  R THIGH (E-G) 4182.3 ml  R FULL LIMB (A-G) 7643.8 ml  Limb Volume differential (LVD)   Leg LVD =12.14%, L>R Thigh LVD 8.3%, L>R Full limb LVD= 8.3%   Volume change since initial %  Volume change overall V  (Blank rows = not tested)  LANDMARK LEFT    R LEG (A-D) 5656.0 ml  R THIGH (E-G) 4529.5 ml  R FULL LIMB (A-G) 10185.5 ml  Limb Volume differential (LVD)  %  Volume change since initial %  Volume change overall %  (Blank rows = not tested)   9th Visit: 12/07/22  [facility] RIGHT   R LEG (A-D) 5116.3 ml  R THIGH (E-G) 5047.6  ml  R FULL LIMB (A-G) 10163.9  ml  Limb Volume differential (LVD)    Volume change since  initial R LEG volume INCREASED by 1.4%;  R THIGH INCREASED by 20.7%, and R FULL LIMB INCREASED by 10.17% since initial on 10/22/22.  Volume change overall V  (Blank rows = not tested)       19 th visit 01/18/23  LANDMARK RIGHT   R LEG (A-D) 4606.5 ml  R THIGH (E-G) 4617.0 ml  R FULL LIMB (A-G) 9224 ml  Limb Volume differential (LVD)    Volume change since initial Since last measured on 9th Rx visit on 12/07/22, R LEG volume is DECREASED by 9.96 %, R THIGH volume is DECREASED by 8.5 %, and R FULL LIMB volume is DECREASED by 9.25 %   Volume change overall Since initially measured on 10/22/22 on 1st Rx visit,  R LEG volume is DECREASED by 8.67 % overall, R THIGH volume is INCREASED by 10 %, and  R FULL LIMB volume is DECREASED by 0.018 %    29 th Visit 03/02/23   LANDMARK RIGHT   R LEG (A-D) 5749.7 ml  R THIGH (E-G)  ml  R FULL LIMB (A-G)  ml  Limb Volume differential (LVD)    Volume change since last Since 01/18/23 R leg  is INCREASED by 15.15%.  Volume change overall   (Blank rows = not tested)  LANDMARK LEFT    L LEG (A-D) Since initial 11/11/22 L LEG is INCREASED by 1.66%.   L THIGH (E-G)   L FULL LIMB (A-G)   Limb Volume differential (LVD)    Volume change since initial 1.66%  Volume change overall      (Blank rows = not tested)  TODAY'S TREATMENT:  LLE/LLQ MLD as established w simultaneous skin care to limit infection risk and increase skin excursion LLE multilayer gradient compression wraps-knee high Pt edu for LE self-care and reviewed progress towards goals to date                  PATIENT EDUCATION:  Pt edu for LE self care:  Continued Pt/ CG edu for lymphedema self care home program throughout session. Emphasis today on lymphedema precautions and cellulitis ID, prevention and Rx. Discussed again fluid dynamics re elevation, and importance of elevation for lymphedema management and venous return. Other topics include outcome of comparative limb volumetrics- starting limb volume  differentials (LVDs), technology and gradient techniques used for short stretch, multilayer compression wrapping, simple self-MLD, therapeutic lymphatic pumping exercises, skin/nail care, LE precautions,. compression garment recommendations and specifications, wear and care schedule and compression garment donning / doffing w assistive devices. Discussed progress towards all OT goals since commencing CDT. All questions answered to the Pt's satisfaction. Good return. Person educated: Patient Education method: Explanation, Demonstration, Tactile cues, Verbal cues, and Handouts Education comprehension: verbalized understanding, returned demonstration, and needs further education  HOME EXERCISE PROGRAM: Intensive Phase Complete Decongestive Therapy (CDT) includes Daily Manual lymphatic drainage: Short neck sequence bilaterally w diaphragmatic breathing to engage deep abdominal lymphatics. Stationary J strokes to inguinal LN, then proximal to distal dynamic J strokes to thigh, "bottleneck" region, leg and foot. Finally 3 retrograde sweeps back to terminus. Daily Skin care w/ low ph lotion matching skin ph to limit infection risk and improve tissue flexibility and excursion Daily, BLE lymphatic pumping therex- seated or supine Daily (23/7) multilayer compression bandages below the knee to reduce limb volume, one limb at a time (One each 8 cm, 10 cm and 12 cm wide x 5 meters long short stretch compression wrap applied circumferentially using gradient layering techniques over single layer of cotton stockinett an Rosidal foam- toes to popliteal fossa  Self-management Phase CDT includes: Appropriate daytime compression garments and HOS devices full time daily. Multiples needed  for hygiene. Replace q 3-6 months Elvarex, flat knit, ccl 225-32 mmHg) knee highs w open toe, t heel, oblique top edge, 2.5 cm wide SB on top, tricot patch at anterior ankle sewn on all 4 sides. To be work full time waking hours. [employer]  RELAX knee high HOS device  Custom-made gradient compression garments and HOS devices are medically necessary in this case because they are uniquely sized and shaped to fit the exact dimensions of the affected extremities with deformities, and to provide accurate and consistent gradient compression and containment, essential to optimally managing this patient's symptoms of chronic, progressive lymphedema. Multiple custom compression garments are needed for optimal hygiene to limit infection risk. Custom  compression garments should be replaced q 3-6 months When worn consistently for optimal lipo-lymphedema self-management over time.   ASSESSMENT:  CLINICAL IMPRESSION: LLE skin condition continues to improve as hyperkeratosis at distal leg slowly recedes. Volume reduction fibrosis also slowly but steadily reducing.   Pt tolerated MLD  with simultaneous skin care w emphasis on leg and ankle without increased pain. Cont as per POC.   OBJECTIVE IMPAIRMENTS: low vision, Abnormal gait, decreased activity tolerance, decreased balance, decreased endurance, decreased knowledge of condition, decreased knowledge of use of DME, decreased mobility, decreased ROM, increased edema, impaired flexibility, postural dysfunction, obesity, and pain.   ACTIVITY LIMITATIONS: functional mobility and ambulation (carrying, lifting, bending, sitting, standing, squatting, sleeping, stairs, transfers, bed mobility, continence, Basic and instrumental ADLs (bathing, dressing, hygiene/grooming, Productive activities ( caring for others) and leisure pursuits, body image, and social participation  PERSONAL FACTORS:  multiple co morbidities, length of time with progressive condition, are also affecting patient's functional outcome.   REHAB POTENTIAL: Fair    EVALUATION COMPLEXITY: Moderate   GOALS: Goals reviewed with patient? Yes  SHORT TERM GOALS: Target date: 4th OT Rx visit   Pt will demonstrate understanding of lymphedema  precautions and prevention strategies with modified independence using a printed reference to identify at least 5 precautions and discussing how s/he may implement them into daily life to reduce risk of progression with extra time (Modified Independence). Baseline:Max A Goal status: 12/07/22 GOAL MET  2.  Pt will be able to apply multilayer, thigh length, gradient, compression wraps to one leg at a time with modified assistance (extra time) to decrease limb volume, to limit infection risk, and to limit lymphedema progression.  Baseline: Dependent Goal status: 11/16/22 PROGRESSING 11/26/22 GOAL MET  LONG TERM GOALS: Target date: 02/01/23  Given this patient's Intake score of 63/100% on the functional outcomes FOTO tool, patient will experience an increase in function of 3 points to improve basic and instrumental ADLs performance, including lymphedema self-care.  Baseline: ONGOING   Goal status:12/07/22 GOAL ONGOING,  01/20/23 PROGRESSING 03/03/23 PROGRESSING   2.  Given this patient's Intake score of 50.0 % on the Lymphedema Life Impact Scale (LLIS), patient will experience a reduction of at least 5 points in her perceived level of functional impairment resulting from lymphedema to improve functional performance and quality of life (QOL). Baseline: 50% Goal status: 12/07/22 GOAL ONGOING; 01/20/23 PROGRESSING,  03/03/23 PROGRESSING  3.  During Intensive phase CDT Pt will achieve at least 85% compliance with all lymphedema self-care home program components, including daily skin care, compression wraps and /or garments, simple self MLD and lymphatic pumping therex, and elevation when sitting to habituate LE self care protocol  into ADLs for optimal LE self-management over time. Baseline: Dependent Goal status: 12/08/22 ONGOING. Pt not changing wraps daily as directed, but will work on increasing elevation anytime she is seated for next progress report. Pt will also increase frequency of re-wrapping her leg to  ensure optimal volume reduction. GOAL MET 01/20/23  4.  Pt will achieve at least a 10% volume reduction below the knees to return limb to typical size and shape, to limit infection risk and LE progression, to decrease pain, to improve function. Baseline: dependent Goal status: 12/07/22 PROGRESSING 01/18/23 : Leg reduction= 8.67% since initial. Thigh  is Increased 10% since initial, And full LE is decreased by 0.018% since 11/11/22;  01/20/23 GOAL MET for L LEG w 9.96 % reduction . 03/01/23 BLE comparative limb volumetrics reveal a  significant 15.15% increase  in limb volume since last measured on 01/18/23. The LLE , the current Rx limb, is increased by 1.66% since initially measured in 4/24.  See volumetric chart for additional details.  5.  Pt will obtain proper compression garments/compression braces and be modified independent with donning/doffing to optimize/stabilize with fluid reduction. Baseline:  Goal status: 12/07/22 PROGRESSING. 01/20/23 PROGRESSING.02/08/23 GOAL MET for RLE  PLAN:  PT FREQUENCY: 2x/week  PT DURATION: 12 weeks  PLANNED INTERVENTIONS: Therapeutic exercises, Therapeutic activity, Patient/Family education, Self Care, Manual lymph drainage, Compression bandaging, scar mobilization, Manual therapy, and skin care throughout MLD  PLAN FOR NEXT SESSION:  MLD and simultaneous skin care to LLE LLE multilayer compression wraps as established Cont Pt edu for LE self care   Loel Dubonnet, MS, OTR/L, CLT-LANA 03/31/23 9:17 AM`

## 2023-03-31 NOTE — Progress Notes (Signed)
   Care Guide Note  03/31/2023 Name: Emily Wood MRN: [id number] DOB: [birth year]  Referred by: Pearline Cables, MD Reason for referral : Care Coordination (Outreach to schedule with Pharm d )   Emily Wood is a 72 y.o. year old female who is a primary care patient of Copland, Gwenlyn Found, MD. Emily Wood was referred to the pharmacist for assistance related to HTN and DM.    An unsuccessful telephone outreach was attempted today to contact the patient who was referred to the pharmacy team for assistance with medication assistance. Additional attempts will be made to contact the patient.   Emily Wood, RMA Care Guide [facility]  [place], [place] [address] Direct Dial: [phone] [email or web address]

## 2023-03-31 NOTE — Progress Notes (Signed)
   Care Guide Note  03/31/2023 Name: Emily Wood MRN: [id number] DOB: Aug 01, 1950  Referred by: Pearline Cables, MD Reason for referral : Care Coordination (Outreach to schedule with Pharm d )   Emily Wood is a 72 y.o. year old female who is a primary care patient of Copland, Gwenlyn Found, MD. [name] was referred to the pharmacist for assistance related to HTN and DM.    Successful contact was made with the patient to discuss pharmacy services including being ready for the pharmacist to call at least 5 minutes before the scheduled appointment time, to have medication bottles and any blood sugar or blood pressure readings ready for review. The patient agreed to meet with the pharmacist via with the pharmacist via telephone visit on (date/time).  04/12/2023  [name], RMA Care Guide [facility]  [place], [place] [address] Direct Dial: [phone] [email or web address]

## 2023-04-02 ENCOUNTER — Encounter: Payer: Medicare Other | Admitting: Occupational Therapy

## 2023-04-02 NOTE — Progress Notes (Signed)
Care Coordination   Note   04/02/2023 Name: Emily Wood MRN: [id number] DOB: 1950/10/11  Emily Wood is a 72 y.o. year old female who sees Copland, Gwenlyn Found, MD for primary care. I reached out to [name] by phone today to offer care coordination services.  Emily Wood was given information about Care Coordination services today including:   The Care Coordination services include support from the care team which includes your Nurse Coordinator, Clinical Social Worker, or Pharmacist.  The Care Coordination team is here to help remove barriers to the health concerns and goals most important to you. Care Coordination services are voluntary, and the patient may decline or stop services at any time by request to their care team member.   Care Coordination Consent Status: Patient agreed to services and verbal consent obtained.   Follow up plan:  Telephone appointment with care coordination team member scheduled for:  04/13/2023  Encounter Outcome:  Patient Scheduled from referral   Burman Nieves, [facility] Care Coordination Care Guide Direct Dial: [phone]

## 2023-04-05 ENCOUNTER — Ambulatory Visit: Payer: Medicare Other | Admitting: Occupational Therapy

## 2023-04-05 ENCOUNTER — Encounter: Payer: Self-pay | Admitting: Occupational Therapy

## 2023-04-05 DIAGNOSIS — I890 Lymphedema, not elsewhere classified: Secondary | ICD-10-CM

## 2023-04-05 NOTE — Therapy (Signed)
+- OUTPATIENT OCCUPATIONAL THERAPY TREATMENT NOTE  LOWER EXTREMITY LYMPHEDEMA Patient Name: JAYLINNE SLAPPEY MRN: [id number] DOB:[birth year], 72 y.o., female Today's Date: 04/05/2023  END OF SESSION:   OT End of Session - 04/05/23 0815     Visit Number 38    Number of Visits 72    Date for OT Re-Evaluation 04/20/23    OT Start Time 0758    OT Stop Time 0902    OT Time Calculation (min) 64 min    Activity Tolerance Patient tolerated treatment well;No increased pain    Behavior During Therapy [facility] for tasks assessed/performed               Past Medical History:  Diagnosis Date   Arthritis    Diabetes mellitus without complication (HCC)    Family history of adverse reaction to anesthesia    PONV   Graves disease 04/14/2020   Hypertension    Lymphedema    Mixed hyperlipidemia 02/20/2021   Past Surgical History:  Procedure Laterality Date   CATARACT EXTRACTION W/PHACO Right 04/14/2022   Procedure: CATARACT EXTRACTION PHACO AND INTRAOCULAR LENS PLACEMENT (IOC) RIGHT DIABETIC 10.93 00:53.0;  Surgeon: Galen Manila, MD;  Location: [facility];  Service: Ophthalmology;  Laterality: Right;   CATARACT EXTRACTION W/PHACO Left 04/28/2022   Procedure: CATARACT EXTRACTION PHACO AND INTRAOCULAR LENS PLACEMENT (IOC) LEFT DIABETIC 6.05 00:45.7;  Surgeon: Galen Manila, MD;  Location: [facility];  Service: Ophthalmology;  Laterality: Left;  Diabetic   CHOLECYSTECTOMY     HERNIA REPAIR     OTHER SURGICAL HISTORY     scar tissue removal from bowels   PARTIAL HYSTERECTOMY     Still has ovaries   REVERSE SHOULDER ARTHROPLASTY Right 09/11/2022   Procedure: REVERSE SHOULDER ARTHROPLASTY;  Surgeon: Yolonda Kida, MD;  Location: [facility];  Service: Orthopedics;  Laterality: Right;  120   UTERINE FIBROID SURGERY     Patient Active Problem List   Diagnosis Date Noted   Normocytic anemia 05/23/2022   AKI (acute kidney injury) (HCC) 05/21/2022   Snoring  11/05/2021   Daytime somnolence 11/05/2021   Coronary artery disease involving native coronary artery of native heart without angina pectoris 05/15/2021   Nonrheumatic tricuspid valve regurgitation 05/15/2021   Pulmonary hypertension, unspecified (HCC) 05/15/2021   Mixed hyperlipidemia 02/20/2021   Obesity (BMI 30-39.9) 02/20/2021   Diabetic retinopathy (HCC) 08/28/2020   Mixed incontinence 05/31/2020   Graves disease 04/14/2020   Right renal mass 11/22/2019   Arthritis of left knee 09/25/2019   Lymphedema 01/25/2017   Fibroid uterus 01/14/2017   Chronic kidney disease, stage 3 (HCC) 09/21/2016   Meralgia paresthetica of left side 08/27/2015   Diabetes mellitus type 2, controlled (HCC) 08/02/2015   Chronic venous insufficiency 06/28/2015   Essential hypertension 02/15/2015   Peripheral edema 02/15/2015   Chronic knee pain 02/15/2015    PCP: Pearline Cables, MD  REFERRING PROVIDER: Sheppard Plumber, NP  REFERRING DIAG: I89.0  THERAPY DIAG:  Lymphedema, not elsewhere classified  Rationale for Evaluation and Treatment: Rehabilitation  ONSET DATE: insidious onset >30 years  SUBJECTIVE:  SUBJECTIVE STATEMENT: Mrs Canete presents to Occupational Therapy for treatment of BLE lymphedema. She arrives with knee length, multilayer compression wraps in place on LLE and compression stocking in place on the R.  She is unaccompanied and walks to clinic. Pt denies LE-related leg pain, but endorses OA related R knee pain 7/10, despite recent gel injection.  PERTINENT HISTORY:  Medical Hx contributing to, or affected by chronic, progressive lymphedema: Norvasc prescription-common side effect is leg swelling; HTN, CVI, Obesity, CAD, OA knees, CKD stage 3, Graves Disease, DM  PAIN:  Are you having pain?  Yes: R arthritis knee pain;  7/10 Pain description: stiff, aching, throbbing, tight, heavy Aggravating factors: standing , walking,  Relieving factors: "The medication from my cardiologist", elevation, compression stockings ( Pt has good hip AROM bilaterally and is able to don them herself w extra time in clinic.)  PRECAUTIONS: Fall and Other: LYMPHEDEMA PRECAUTIONS: (CKD, CAD and DM, GRAVES )   WEIGHT BEARING RESTRICTIONS: No  FALLS:  Has patient fallen since last visit? NO    HAND DOMINANCE: right   PRIOR LEVEL OF FUNCTION: Requires assistive device for independence, Needs assistance with homemaking, and Needs assistance with gait  PATIENT GOALS: ".... to get this lymphedema under control. It's embarrassing. That's why I wear long skirts."   OBJECTIVE:   Mild, Stage  II, Bilateral Lower Extremity Lymphedema 2/2 CVI and Obesity  OBSERVATIONS / OTHER ASSESSMENTS:   Lymphedema Life Impact Scape (LLIS): Intake 11/03/22: 50% (The extent to which L:E-related problems affected your life during the past week)  FOTO functional outcome scale: Intake 11/16/22: 63%  BLE COMPARATIVE LIMB VOLUMETRICS:   Intake measurements completed 10/22/22  LANDMARK RIGHT   R LEG (A-D) 5043.4 ml  R THIGH (E-G) 4182.3 ml  R FULL LIMB (A-G) 7643.8 ml  Limb Volume differential (LVD)   Leg LVD =12.14%, L>R Thigh LVD 8.3%, L>R Full limb LVD= 8.3%   Volume change since initial %  Volume change overall V  (Blank rows = not tested)  LANDMARK LEFT    R LEG (A-D) 5656.0 ml  R THIGH (E-G) 4529.5 ml  R FULL LIMB (A-G) 10185.5 ml  Limb Volume differential (LVD)  %  Volume change since initial %  Volume change overall %  (Blank rows = not tested)   9th Visit: 12/07/22  [facility] RIGHT   R LEG (A-D) 5116.3 ml  R THIGH (E-G) 5047.6  ml  R FULL LIMB (A-G) 10163.9  ml  Limb Volume differential (LVD)    Volume change since initial R LEG volume INCREASED by 1.4%;  R THIGH INCREASED by 20.7%, and R FULL LIMB  INCREASED by 10.17% since initial on 10/22/22.  Volume change overall V  (Blank rows = not tested)       19 th visit 01/18/23  LANDMARK RIGHT   R LEG (A-D) 4606.5 ml  R THIGH (E-G) 4617.0 ml  R FULL LIMB (A-G) 9224 ml  Limb Volume differential (LVD)    Volume change since initial Since last measured on 9th Rx visit on 12/07/22, R LEG volume is DECREASED by 9.96 %, R THIGH volume is DECREASED by 8.5 %, and R FULL LIMB volume is DECREASED by 9.25 %   Volume change overall Since initially measured on 10/22/22 on 1st Rx visit,  R LEG volume is DECREASED by 8.67 % overall, R THIGH volume is INCREASED by 10 %, and R FULL LIMB volume is DECREASED by 0.018 %    29 th Visit 03/02/23  LANDMARK RIGHT   R LEG (A-D) 5749.7 ml  R THIGH (E-G)  ml  R FULL LIMB (A-G)  ml  Limb Volume differential (LVD)    Volume change since last Since 01/18/23 R leg  is INCREASED by 15.15%.  Volume change overall   (Blank rows = not tested)  LANDMARK LEFT    L LEG (A-D) Since initial 11/11/22 L LEG is INCREASED by 1.66%.   L THIGH (E-G)   L FULL LIMB (A-G)   Limb Volume differential (LVD)    Volume change since initial 1.66%  Volume change overall    39 th Visit TBA next session   LANDMARK RIGHT   R LEG (A-D)  ml  R THIGH (E-G)  ml  R FULL LIMB (A-G)  ml  Limb Volume differential (LVD)    Volume change since last   Volume change overall     TODAY'S TREATMENT:  LLE/LLQ MLD as established w simultaneous skin care to limit infection risk and increase skin excursion LLE multilayer gradient compression wraps-knee high Pt edu for LE self-care and reviewed progress towards goals to date                  PATIENT EDUCATION:  Pt edu for LE self care:  Continued Pt/ CG edu for lymphedema self care home program throughout session. Emphasis today on lymphedema precautions and cellulitis ID, prevention and Rx. Discussed again fluid dynamics re elevation, and importance of elevation for lymphedema management and venous  return. Other topics include outcome of comparative limb volumetrics- starting limb volume differentials (LVDs), technology and gradient techniques used for short stretch, multilayer compression wrapping, simple self-MLD, therapeutic lymphatic pumping exercises, skin/nail care, LE precautions,. compression garment recommendations and specifications, wear and care schedule and compression garment donning / doffing w assistive devices. Discussed progress towards all OT goals since commencing CDT. All questions answered to the Pt's satisfaction. Good return. Person educated: Patient Education method: Explanation, Demonstration, Tactile cues, Verbal cues, and Handouts Education comprehension: verbalized understanding, returned demonstration, and needs further education  HOME EXERCISE PROGRAM: Intensive Phase Complete Decongestive Therapy (CDT) includes Daily Manual lymphatic drainage: Short neck sequence bilaterally w diaphragmatic breathing to engage deep abdominal lymphatics. Stationary J strokes to inguinal LN, then proximal to distal dynamic J strokes to thigh, "bottleneck" region, leg and foot. Finally 3 retrograde sweeps back to terminus. Daily Skin care w/ low ph lotion matching skin ph to limit infection risk and improve tissue flexibility and excursion Daily, BLE lymphatic pumping therex- seated or supine Daily (23/7) multilayer compression bandages below the knee to reduce limb volume, one limb at a time (One each 8 cm, 10 cm and 12 cm wide x 5 meters long short stretch compression wrap applied circumferentially using gradient layering techniques over single layer of cotton stockinett an Rosidal foam- toes to popliteal fossa  Self-management Phase CDT includes: Appropriate daytime compression garments and HOS devices full time daily. Multiples needed  for hygiene. Replace q 3-6 months Elvarex, flat knit, ccl 225-32 mmHg) knee highs w open toe, t heel, oblique top edge, 2.5 cm wide SB on top,  tricot patch at anterior ankle sewn on all 4 sides. To be work full time waking hours. Jobst RELAX knee high HOS device  Custom-made gradient compression garments and HOS devices are medically necessary in this case because they are uniquely sized and shaped to fit the exact dimensions of the affected extremities with deformities, and to provide accurate and consistent gradient compression and containment,  essential to optimally managing this patient's symptoms of chronic, progressive lymphedema. Multiple custom compression garments are needed for optimal hygiene to limit infection risk. Custom compression garments should be replaced q 3-6 months When worn consistently for optimal lipo-lymphedema self-management over time.   ASSESSMENT:  CLINICAL IMPRESSION: Continued RLE/RLQ MLD as established with fibrosis techniques to distal leg and ankle. Fibrosis in this region is very stubborn and , to date, somewhat intractable. Provided simultaneous skin care to limit infection risk. Applied knee length, multi layer compression wraps as established. Plan to complete volumetrics at next visit to check  progress on reduction in hopes of being able to move forward on garment measurements  ASAP. Cont as per POC.   OBJECTIVE IMPAIRMENTS: low vision, Abnormal gait, decreased activity tolerance, decreased balance, decreased endurance, decreased knowledge of condition, decreased knowledge of use of DME, decreased mobility, decreased ROM, increased edema, impaired flexibility, postural dysfunction, obesity, and pain.   ACTIVITY LIMITATIONS: functional mobility and ambulation (carrying, lifting, bending, sitting, standing, squatting, sleeping, stairs, transfers, bed mobility, continence, Basic and instrumental ADLs (bathing, dressing, hygiene/grooming, Productive activities ( caring for others) and leisure pursuits, body image, and social participation  PERSONAL FACTORS:  multiple co morbidities, length of time with  progressive condition, are also affecting patient's functional outcome.   REHAB POTENTIAL: Fair    EVALUATION COMPLEXITY: Moderate   GOALS: Goals reviewed with patient? Yes  SHORT TERM GOALS: Target date: 4th OT Rx visit   Pt will demonstrate understanding of lymphedema precautions and prevention strategies with modified independence using a printed reference to identify at least 5 precautions and discussing how s/he may implement them into daily life to reduce risk of progression with extra time (Modified Independence). Baseline:Max A Goal status: 12/07/22 GOAL MET  2.  Pt will be able to apply multilayer, thigh length, gradient, compression wraps to one leg at a time with modified assistance (extra time) to decrease limb volume, to limit infection risk, and to limit lymphedema progression.  Baseline: Dependent Goal status: 11/16/22 PROGRESSING 11/26/22 GOAL MET  LONG TERM GOALS: Target date: 02/01/23  Given this patient's Intake score of 63/100% on the functional outcomes FOTO tool, patient will experience an increase in function of 3 points to improve basic and instrumental ADLs performance, including lymphedema self-care.  Baseline: ONGOING   Goal status:12/07/22 GOAL ONGOING,  01/20/23 PROGRESSING 03/03/23 PROGRESSING   2.  Given this patient's Intake score of 50.0 % on the Lymphedema Life Impact Scale (LLIS), patient will experience a reduction of at least 5 points in her perceived level of functional impairment resulting from lymphedema to improve functional performance and quality of life (QOL). Baseline: 50% Goal status: 12/07/22 GOAL ONGOING; 01/20/23 PROGRESSING,  03/03/23 PROGRESSING  3.  During Intensive phase CDT Pt will achieve at least 85% compliance with all lymphedema self-care home program components, including daily skin care, compression wraps and /or garments, simple self MLD and lymphatic pumping therex, and elevation when sitting to habituate LE self care protocol  into ADLs  for optimal LE self-management over time. Baseline: Dependent Goal status: 12/08/22 ONGOING. Pt not changing wraps daily as directed, but will work on increasing elevation anytime she is seated for next progress report. Pt will also increase frequency of re-wrapping her leg to ensure optimal volume reduction. GOAL MET 01/20/23  4.  Pt will achieve at least a 10% volume reduction below the knees to return limb to typical size and shape, to limit infection risk and LE progression, to decrease pain, to  improve function. Baseline: dependent Goal status: 12/07/22 PROGRESSING 01/18/23 : Leg reduction= 8.67% since initial. Thigh  is Increased 10% since initial, And full LE is decreased by 0.018% since 11/11/22;  01/20/23 GOAL MET for L LEG w 9.96 % reduction . 03/01/23 BLE comparative limb volumetrics reveal a  significant 15.15% increase in limb volume since last measured on 01/18/23. The LLE , the current Rx limb, is increased by 1.66% since initially measured in 4/24.  See volumetric chart for additional details.  5.  Pt will obtain proper compression garments/compression braces and be modified independent with donning/doffing to optimize/stabilize with fluid reduction. Baseline:  Goal status: 12/07/22 PROGRESSING. 01/20/23 PROGRESSING.02/08/23 GOAL MET for RLE  PLAN:  PT FREQUENCY: 2x/week  PT DURATION: 12 weeks  PLANNED INTERVENTIONS: Therapeutic exercises, Therapeutic activity, Patient/Family education, Self Care, Manual lymph drainage, Compression bandaging, scar mobilization, Manual therapy, and skin care throughout MLD  PLAN FOR NEXT SESSION:  LLE comparative limb volumetrics in prep for upcoming progress report and for Rx planning MLD and simultaneous skin care to LLE LLE multilayer compression wraps as established Cont Pt edu for LE self care   Loel Dubonnet, MS, OTR/L, CLT-LANA 04/05/23 12:45 PM`

## 2023-04-07 ENCOUNTER — Encounter: Payer: Self-pay | Admitting: Occupational Therapy

## 2023-04-07 ENCOUNTER — Ambulatory Visit: Payer: Medicare Other | Admitting: Occupational Therapy

## 2023-04-07 DIAGNOSIS — I890 Lymphedema, not elsewhere classified: Secondary | ICD-10-CM | POA: Diagnosis not present

## 2023-04-07 NOTE — Therapy (Signed)
+- OUTPATIENT OCCUPATIONAL THERAPY TREATMENT NOTE  LOWER EXTREMITY LYMPHEDEMA Patient Name: Emily Wood MRN: [id number] DOB:Aug 08, 1950, 72 y.o., female Today's Date: 04/07/2023  END OF SESSION:   OT End of Session - 04/07/23 0804     Visit Number 39    Number of Visits 72    Date for OT Re-Evaluation 04/20/23    OT Start Time 0804    OT Stop Time 0903    OT Time Calculation (min) 59 min    Activity Tolerance Patient tolerated treatment well;No increased pain    Behavior During Therapy [facility] for tasks assessed/performed               Past Medical History:  Diagnosis Date   Arthritis    Diabetes mellitus without complication (HCC)    Family history of adverse reaction to anesthesia    PONV   Graves disease 04/14/2020   Hypertension    Lymphedema    Mixed hyperlipidemia 02/20/2021   Past Surgical History:  Procedure Laterality Date   CATARACT EXTRACTION W/PHACO Right 04/14/2022   Procedure: CATARACT EXTRACTION PHACO AND INTRAOCULAR LENS PLACEMENT (IOC) RIGHT DIABETIC 10.93 00:53.0;  Surgeon: Galen Manila, MD;  Location: [facility];  Service: Ophthalmology;  Laterality: Right;   CATARACT EXTRACTION W/PHACO Left 04/28/2022   Procedure: CATARACT EXTRACTION PHACO AND INTRAOCULAR LENS PLACEMENT (IOC) LEFT DIABETIC 6.05 00:45.7;  Surgeon: Galen Manila, MD;  Location: [facility];  Service: Ophthalmology;  Laterality: Left;  Diabetic   CHOLECYSTECTOMY     HERNIA REPAIR     OTHER SURGICAL HISTORY     scar tissue removal from bowels   PARTIAL HYSTERECTOMY     Still has ovaries   REVERSE SHOULDER ARTHROPLASTY Right 09/11/2022   Procedure: REVERSE SHOULDER ARTHROPLASTY;  Surgeon: Yolonda Kida, MD;  Location: [facility];  Service: Orthopedics;  Laterality: Right;  120   UTERINE FIBROID SURGERY     Patient Active Problem List   Diagnosis Date Noted   Normocytic anemia 05/23/2022   AKI (acute kidney injury) (HCC) 05/21/2022   Snoring  11/05/2021   Daytime somnolence 11/05/2021   Coronary artery disease involving native coronary artery of native heart without angina pectoris 05/15/2021   Nonrheumatic tricuspid valve regurgitation 05/15/2021   Pulmonary hypertension, unspecified (HCC) 05/15/2021   Mixed hyperlipidemia 02/20/2021   Obesity (BMI 30-39.9) 02/20/2021   Diabetic retinopathy (HCC) 08/28/2020   Mixed incontinence 05/31/2020   Graves disease 04/14/2020   Right renal mass 11/22/2019   Arthritis of left knee 09/25/2019   Lymphedema 01/25/2017   Fibroid uterus 01/14/2017   Chronic kidney disease, stage 3 (HCC) 09/21/2016   Meralgia paresthetica of left side 08/27/2015   Diabetes mellitus type 2, controlled (HCC) 08/02/2015   Chronic venous insufficiency 06/28/2015   Essential hypertension 02/15/2015   Peripheral edema 02/15/2015   Chronic knee pain 02/15/2015    PCP: Pearline Cables, MD  REFERRING PROVIDER: Sheppard Plumber, NP  REFERRING DIAG: I89.0  THERAPY DIAG:  Lymphedema, not elsewhere classified  Rationale for Evaluation and Treatment: Rehabilitation  ONSET DATE: insidious onset >30 years  SUBJECTIVE:  SUBJECTIVE STATEMENT: Emily Wood presents to Occupational Therapy for treatment of BLE lymphedema. She arrives with knee length, multilayer compression wraps in place on LLE and compression stocking in place on the R.  She is unaccompanied and walks to clinic. Pt denies LE-related leg pain, but endorses OA related R knee pain 7/10, despite recent gel injection.  PERTINENT HISTORY:  Medical Hx contributing to, or affected by chronic, progressive lymphedema: Norvasc prescription-common side effect is leg swelling; HTN, CVI, Obesity, CAD, OA knees, CKD stage 3, Graves Disease, DM  PAIN:  Are you having pain?  Yes: R arthritis knee pain;  7/10 Pain description: stiff, aching, throbbing, tight, heavy Aggravating factors: standing , walking,  Relieving factors: "The medication from my cardiologist", elevation, compression stockings ( Pt has good hip AROM bilaterally and is able to don them herself w extra time in clinic.)  PRECAUTIONS: Fall and Other: LYMPHEDEMA PRECAUTIONS: (CKD, CAD and DM, GRAVES )   WEIGHT BEARING RESTRICTIONS: No  FALLS:  Has patient fallen since last visit? NO    HAND DOMINANCE: right   PRIOR LEVEL OF FUNCTION: Requires assistive device for independence, Needs assistance with homemaking, and Needs assistance with gait  PATIENT GOALS: ".... to get this lymphedema under control. It's embarrassing. That's why I wear long skirts."   OBJECTIVE:   Mild, Stage  II, Bilateral Lower Extremity Lymphedema 2/2 CVI and Obesity  OBSERVATIONS / OTHER ASSESSMENTS:   Lymphedema Life Impact Scape (LLIS): Intake 11/03/22: 50% (The extent to which L:E-related problems affected your life during the past week)  FOTO functional outcome scale: Intake 11/16/22: 63%  BLE COMPARATIVE LIMB VOLUMETRICS:   Intake measurements completed 10/22/22  LANDMARK RIGHT   R LEG (A-D) 5043.4 ml  R THIGH (E-G) 4182.3 ml  R FULL LIMB (A-G) 7643.8 ml  Limb Volume differential (LVD)   Leg LVD =12.14%, L>R Thigh LVD 8.3%, L>R Full limb LVD= 8.3%   Volume change since initial %  Volume change overall V  (Blank rows = not tested)  LANDMARK LEFT    R LEG (A-D) 5656.0 ml  R THIGH (E-G) 4529.5 ml  R FULL LIMB (A-G) 10185.5 ml  Limb Volume differential (LVD)  %  Volume change since initial %  Volume change overall %  (Blank rows = not tested)   9th Visit: 12/07/22  [facility] RIGHT   R LEG (A-D) 5116.3 ml  R THIGH (E-G) 5047.6  ml  R FULL LIMB (A-G) 10163.9  ml  Limb Volume differential (LVD)    Volume change since initial R LEG volume INCREASED by 1.4%;  R THIGH INCREASED by 20.7%, and R FULL LIMB  INCREASED by 10.17% since initial on 10/22/22.  Volume change overall V  (Blank rows = not tested)       19 th visit 01/18/23  LANDMARK RIGHT   R LEG (A-D) 4606.5 ml  R THIGH (E-G) 4617.0 ml  R FULL LIMB (A-G) 9224 ml  Limb Volume differential (LVD)    Volume change since initial Since last measured on 9th Rx visit on 12/07/22, R LEG volume is DECREASED by 9.96 %, R THIGH volume is DECREASED by 8.5 %, and R FULL LIMB volume is DECREASED by 9.25 %   Volume change overall Since initially measured on 10/22/22 on 1st Rx visit,  R LEG volume is DECREASED by 8.67 % overall, R THIGH volume is INCREASED by 10 %, and R FULL LIMB volume is DECREASED by 0.018 %    29 th Visit 03/02/23  LANDMARK RIGHT   R LEG (A-D) 5749.7 ml  R THIGH (E-G)  ml  R FULL LIMB (A-G)  ml  Limb Volume differential (LVD)    Volume change since last Since 01/18/23 R leg  is INCREASED by 15.15%.  Volume change overall   (Blank rows = not tested)  LANDMARK LEFT    L LEG (A-D) Since initial 11/11/22 L LEG is INCREASED by 1.66%.   L THIGH (E-G)   L FULL LIMB (A-G)   Limb Volume differential (LVD)    Volume change since initial +1.66%  Volume change overall    39 th Visit 04/07/23   LANDMARK RIGHT   R LEG (A-D) 5297.8  ml  R THIGH (E-G)  ml  R FULL LIMB (A-G)  ml  Limb Volume differential (LVD)    Volume change since last -7.9%  Volume change overall     TODAY'S TREATMENT:  LLE comparative limb volumetrics LLE multilayer gradient compression wraps-knee high Pt edu for LE self-care and reviewed progress towards goals to date                  PATIENT EDUCATION:  Pt edu for LE self care:  Continued Pt/ CG edu for lymphedema self care home program throughout session. Emphasis today on lymphedema precautions and cellulitis ID, prevention and Rx. Discussed again fluid dynamics re elevation, and importance of elevation for lymphedema management and venous return. Other topics include outcome of comparative limb volumetrics-  starting limb volume differentials (LVDs), technology and gradient techniques used for short stretch, multilayer compression wrapping, simple self-MLD, therapeutic lymphatic pumping exercises, skin/nail care, LE precautions,. compression garment recommendations and specifications, wear and care schedule and compression garment donning / doffing w assistive devices. Discussed progress towards all OT goals since commencing CDT. All questions answered to the Pt's satisfaction. Good return. Person educated: Patient Education method: Explanation, Demonstration, Tactile cues, Verbal cues, and Handouts Education comprehension: verbalized understanding, returned demonstration, and needs further education  HOME EXERCISE PROGRAM: Intensive Phase Complete Decongestive Therapy (CDT) includes Daily Manual lymphatic drainage: Short neck sequence bilaterally w diaphragmatic breathing to engage deep abdominal lymphatics. Stationary J strokes to inguinal LN, then proximal to distal dynamic J strokes to thigh, "bottleneck" region, leg and foot. Finally 3 retrograde sweeps back to terminus. Daily Skin care w/ low ph lotion matching skin ph to limit infection risk and improve tissue flexibility and excursion Daily, BLE lymphatic pumping therex- seated or supine Daily (23/7) multilayer compression bandages below the knee to reduce limb volume, one limb at a time (One each 8 cm, 10 cm and 12 cm wide x 5 meters long short stretch compression wrap applied circumferentially using gradient layering techniques over single layer of cotton stockinett an Rosidal foam- toes to popliteal fossa  Self-management Phase CDT includes: Appropriate daytime compression garments and HOS devices full time daily. Multiples needed  for hygiene. Replace q 3-6 months Elvarex, flat knit, ccl 225-32 mmHg) knee highs w open toe, t heel, oblique top edge, 2.5 cm wide SB on top, tricot patch at anterior ankle sewn on all 4 sides. To be work full time  waking hours. Jobst RELAX knee high HOS device  Custom-made gradient compression garments and HOS devices are medically necessary in this case because they are uniquely sized and shaped to fit the exact dimensions of the affected extremities with deformities, and to provide accurate and consistent gradient compression and containment, essential to optimally managing this patient's symptoms of chronic, progressive lymphedema. Multiple custom  compression garments are needed for optimal hygiene to limit infection risk. Custom compression garments should be replaced q 3-6 months When worn consistently for optimal lipo-lymphedema self-management over time.   ASSESSMENT:  CLINICAL IMPRESSION: L LEG comparative limb volumetrics reveal a DECREASE in limb volume of 7.9% since least measured on 03/01/23. While this value does not yet meet 10% goal reduction for L LEG, it demonstrates a positive response to CDT good progress  toward OT goals for LE self care. Applied knee length, multi layer compression wraps as established. Plan to complete LLE custom compression garment measurements  next session. Cont as per POC.   OBJECTIVE IMPAIRMENTS: low vision, Abnormal gait, decreased activity tolerance, decreased balance, decreased endurance, decreased knowledge of condition, decreased knowledge of use of DME, decreased mobility, decreased ROM, increased edema, impaired flexibility, postural dysfunction, obesity, and pain.   ACTIVITY LIMITATIONS: functional mobility and ambulation (carrying, lifting, bending, sitting, standing, squatting, sleeping, stairs, transfers, bed mobility, continence, Basic and instrumental ADLs (bathing, dressing, hygiene/grooming, Productive activities ( caring for others) and leisure pursuits, body image, and social participation  PERSONAL FACTORS:  multiple co morbidities, length of time with progressive condition, are also affecting patient's functional outcome.   REHAB POTENTIAL: Fair     EVALUATION COMPLEXITY: Moderate   GOALS: Goals reviewed with patient? Yes  SHORT TERM GOALS: Target date: 4th OT Rx visit   Pt will demonstrate understanding of lymphedema precautions and prevention strategies with modified independence using a printed reference to identify at least 5 precautions and discussing how s/he may implement them into daily life to reduce risk of progression with extra time (Modified Independence). Baseline:Max A Goal status: 12/07/22 GOAL MET  2.  Pt will be able to apply multilayer, thigh length, gradient, compression wraps to one leg at a time with modified assistance (extra time) to decrease limb volume, to limit infection risk, and to limit lymphedema progression.  Baseline: Dependent Goal status: 11/16/22 PROGRESSING 11/26/22 GOAL MET  LONG TERM GOALS: Target date: 02/01/23  Given this patient's Intake score of 63/100% on the functional outcomes FOTO tool, patient will experience an increase in function of 3 points to improve basic and instrumental ADLs performance, including lymphedema self-care.  Baseline: ONGOING   Goal status:12/07/22 GOAL ONGOING,  01/20/23 PROGRESSING 03/03/23 PROGRESSING   2.  Given this patient's Intake score of 50.0 % on the Lymphedema Life Impact Scale (LLIS), patient will experience a reduction of at least 5 points in her perceived level of functional impairment resulting from lymphedema to improve functional performance and quality of life (QOL). Baseline: 50% Goal status: 12/07/22 GOAL ONGOING; 01/20/23 PROGRESSING,  03/03/23 PROGRESSING  3.  During Intensive phase CDT Pt will achieve at least 85% compliance with all lymphedema self-care home program components, including daily skin care, compression wraps and /or garments, simple self MLD and lymphatic pumping therex, and elevation when sitting to habituate LE self care protocol  into ADLs for optimal LE self-management over time. Baseline: Dependent Goal status: 12/08/22 ONGOING. Pt  not changing wraps daily as directed, but will work on increasing elevation anytime she is seated for next progress report. Pt will also increase frequency of re-wrapping her leg to ensure optimal volume reduction. GOAL MET 01/20/23  4.  Pt will achieve at least a 10% volume reduction below the knees to return limb to typical size and shape, to limit infection risk and LE progression, to decrease pain, to improve function. Baseline: dependent Goal status: 12/07/22 PROGRESSING 01/18/23 : Leg reduction= 8.67% since  initial. Thigh  is Increased 10% since initial, And full LE is decreased by 0.018% since 11/11/22;  01/20/23 GOAL MET for L LEG w 9.96 % reduction . 03/01/23 BLE comparative limb volumetrics reveal a  significant 15.15% increase in limb volume since last measured on 01/18/23. The LLE , the current Rx limb, is increased by 1.66% since initially measured in 4/24.  04/07/23 PROGRESSING with 7.9% reduction in the L leg since 03/01/23. See volumetric chart for additional details.  5.  Pt will obtain proper compression garments/compression braces and be modified independent with donning/doffing to optimize/stabilize with fluid reduction. Baseline:  Goal status: 12/07/22 PROGRESSING. 01/20/23 PROGRESSING.02/08/23 GOAL MET for RLE  PLAN:  PT FREQUENCY: 2x/week  PT DURATION: 12 weeks  PLANNED INTERVENTIONS: Therapeutic exercises, Therapeutic activity, Patient/Family education, Self Care, Manual lymph drainage, Compression bandaging, scar mobilization, Manual therapy, and skin care throughout MLD  PLAN FOR NEXT SESSION:  LLE compression garment measurements and specifications LLE multilayer compression wraps as established Cont Pt edu for LE self care and progress  to date   Loel Dubonnet, MS, OTR/L, CLT-LANA 04/07/23 10:22 AM`

## 2023-04-12 ENCOUNTER — Ambulatory Visit: Payer: Medicare Other | Admitting: Occupational Therapy

## 2023-04-12 ENCOUNTER — Ambulatory Visit (INDEPENDENT_AMBULATORY_CARE_PROVIDER_SITE_OTHER): Payer: Medicare Other | Admitting: Pharmacist

## 2023-04-12 ENCOUNTER — Encounter: Payer: Self-pay | Admitting: Occupational Therapy

## 2023-04-12 DIAGNOSIS — Z79899 Other long term (current) drug therapy: Secondary | ICD-10-CM

## 2023-04-12 DIAGNOSIS — I890 Lymphedema, not elsewhere classified: Secondary | ICD-10-CM

## 2023-04-12 DIAGNOSIS — I10 Essential (primary) hypertension: Secondary | ICD-10-CM

## 2023-04-12 NOTE — Progress Notes (Unsigned)
04/13/2023 Name: Emily Wood MRN: [id number] DOB: February 18, 1951  Chief Complaint  Patient presents with   Hypertension   Medication Management    Emily Wood is a 72 y.o. year old female who presented for a telephone visit.   They were referred to the pharmacist by their PCP for assistance in managing hypertension, medication access, and complex medication management.  Patient is also on list as having uncontrolled hypertension.    Subjective:  Care Team: Primary Care Provider: Pearline Cables, MD ; Next Scheduled Visit: not currently scheduled Cardiologist: Dr Servando Salina; Next Scheduled Visit: 05/14/2023 Endocrinologist [employer]; Next Scheduled Visit: unable to see Orthopedist: Dr Charlann Boxer; Next Scheduled Visit: unable to see  Medication Access/Adherence  Current Pharmacy:  [id number] - [place], [place] - [address] AT [facility] [address] [place] [address] Phone: [phone] Fax: [phone]  [facility]) - [place], [place] - [address] [place] [address] [place] [address] Phone: [phone] Fax: [phone]  [facility] - [place], [place] - [address] [place] [address] Phone: [phone] Fax: [phone]   Patient reports affordability concerns with their medications: Yes  - has not been able to afford Gemtesa. She is still taking oxybutynin Patient is getting Ozempic from [employer] patient assistance program thru her endocrinology office.  Patient reports access/transportation concerns to their pharmacy: No  Patient reports adherence concerns with their medications:  No      Hypertension:  Current medications: clonidine 0.2mg  3 times a day, hydrochlorothiazide 25mg  daily (per patient not taking - states she was instructed not to take by nephrologist), irbesartan 370m daily, amlodipine 10mg  daily,  carvedilol 12.5mg  twice a day   Patient has a validated, automated, upper arm home BP cuff that was supplied by cardiology office (brand is CAZON) but she states that the normal sized cuff is too small for her.  Current blood pressure readings readings: none to report.   Patient denies hypotensive s/sx including no dizziness, lightheadedness.  Patient denies hypertensive symptoms including no headache, chest pain, shortness of breath  Current physical activity: has join Silver [employer]neakers    Medication Management: [place] has not bee able to continue to take Gemtesa due to cost. She has gotten samples from urologist tin past but she needs to make a follow up appointment with them.   She has continues to take oxybutynin She also mentions that her incontinence pads are expensive.   Patient reports Good adherence to medications  Patient reports the following barriers to adherence:  Complex medication regimen Medication cost Lack of tools and confidence for self-management of hypertension  Fear of medication side effects    Objective:  BP Readings from Last 3 Encounters:  10/26/22 (!) 144/88  09/11/22 (!) 144/66  09/09/22 (!) 140/80    Lab Results  Component Value Date   HGBA1C 5.6 09/01/2022    Lab Results  Component Value Date   CREATININE 1.3 (A) 03/29/2023   BUN 19 03/29/2023   NA 141 03/29/2023   K 4.0 03/29/2023   CL 105 03/29/2023   CO2 25 (A) 03/29/2023    Lab Results  Component Value Date   CHOL 128 05/20/2022   HDL 47.10 05/20/2022   LDLCALC 60 05/20/2022   TRIG 100.0 05/20/2022   CHOLHDL 3 05/20/2022    Medications Reviewed Today     Reviewed by Henrene Pastor, RPH-CPP (  Pharmacist) on 04/13/23 at 1653  Med List Status: <None>   Medication Order Taking? Sig Documenting Provider Last Dose Status Informant  Accu-Chek Softclix Lancets lancets [id number]  Check blood sugars once daily Copland, Gwenlyn Found, MD  Active Self  Alpha-Lipoic Acid 300 MG CAPS  [id number] Yes Take 600 mg by mouth daily. [provider] Taking Active Self  amLODipine (NORVASC) 5 MG tablet [id number] No TAKE 1 AND 1/2 TABLETS BY MOUTH  DAILY  Patient not taking: Reported on 03/29/2023   Copland, Gwenlyn Found, MD Not Taking Active Self  Ascorbic Acid (VITAMIN C) 1000 MG tablet [id number] Yes Take 1,000 mg by mouth daily. [provider] Taking Active Self  aspirin 81 MG tablet [id number] Yes Take 81 mg by mouth daily. [provider] Taking Active Self  atorvastatin (LIPITOR) 40 MG tablet [id number] Yes TAKE 1 TABLET BY MOUTH DAILY Tobb, Kardie, DO Taking Active   Biotin w/ Vitamins C & E (HAIR/SKIN/NAILS PO) [id number] Yes Take 6,000 mcg by mouth daily. [provider] Taking Active Self  carvedilol (COREG) 12.5 MG tablet [id number] Yes TAKE 1 TABLET BY MOUTH TWICE  DAILY Tobb, Kardie, DO Taking Active   Cholecalciferol (VITAMIN D3) 125 MCG (5000 UT) CAPS [id number] Yes Take 1 capsule (5,000 Units total) by mouth daily. Alberteen Sam, MD Taking Active Self  cloNIDine (CATAPRES) 0.2 MG tablet [id number] Yes TAKE 1 TABLET BY MOUTH 3 TIMES  DAILY Copland, Gwenlyn Found, MD Taking Active Self  Cobalamin Combinations (B-12) [phone] MCG SUBL [id number] Yes Place 5,000 mcg under the tongue daily. [provider] Taking Active Self  diclofenac Sodium (VOLTAREN) 1 % GEL [id number] Yes Apply 4 g topically 4 (four) times daily.  Patient taking differently: Apply 4 g topically daily as needed.   Saguier, Ramon Dredge, PA-C Taking Active Self  gabapentin (NEURONTIN) 400 MG capsule [id number] Yes Take 2 capsules (800 mg total) by mouth 3 (three) times daily. Copland, Gwenlyn Found, MD Taking Active   GEMTESA 75 MG TABS [id number] No Take 75 mg by mouth daily.  Patient not taking: Reported on 03/29/2023   [provider] Not Taking Active Self           Med Note Marius Ditch Apr 12, 2023 11:25 AM) Not taking due to cost  glucose blood (ACCU-CHEK  GUIDE) test strip [id number]  Check blood sugars once daily Copland, Gwenlyn Found, MD  Active Self  hydrochlorothiazide (HYDRODIURIL) 25 MG tablet [id number] No TAKE 1 TABLET BY MOUTH DAILY  Patient not taking: Reported on 04/12/2023   Copland, Gwenlyn Found, MD Not Taking Consider Medication Status and Discontinue (Change in therapy) Self  irbesartan (AVAPRO) 300 MG tablet [id number] Yes TAKE 1 TABLET BY MOUTH DAILY Copland, Gwenlyn Found, MD Taking Active   isosorbide mononitrate (IMDUR) 30 MG 24 hr tablet [id number] Yes TAKE 1 TABLET BY MOUTH DAILY Tobb, Kardie, DO Taking Active   methimazole (TAPAZOLE) 5 MG tablet [id number] Yes Take 2.5 mg by mouth daily. [provider] Taking Active Self  Multiple Vitamin (MULTIVITAMIN ADULT PO) [id number] Yes Take 2 tablets by mouth daily. [provider] Taking Active Self  nystatin cream (MYCOSTATIN) [id number]  Apply 1 application topically 2 (two) times daily.  Patient taking differently: Apply 1 application  topically 2 (two) times daily as needed for dry skin.   Vena Austria, MD  Active Self  oxybutynin (DITROPAN-XL) 10 MG 24 hr tablet [id number] Yes Take 10 mg by mouth at bedtime. [provider] Taking  Active Self  OZEMPIC, 0.25 OR 0.5 MG/DOSE, 2 MG/3ML SOPN [id number] Yes Inject 0.5 mg into the skin once a week. [provider] Taking Active Self           Med Note Marius Ditch Apr 12, 2023 11:34 AM) Getting thru [employer] from endocrinologist office - thru 07/20/2023  potassium chloride SA (KLOR-CON M) 20 MEQ tablet [id number] Yes TAKE 1 TABLET BY MOUTH DAILY Copland, Gwenlyn Found, MD Taking Active Self  prednisoLONE acetate (PRED FORTE) 1 % ophthalmic suspension [id number] Yes Place 1 drop into both eyes 4 (four) times daily. Alberteen Sam, MD Taking Active Self  torsemide (DEMADEX) 10 MG tablet [id number] Yes TAKE 1 TABLET (10 MG TOTAL) BY MOUTH DAILY. USE AS NEEDED FOR LOWER EXTREMITY SWELLING Copland, Gwenlyn Found, MD Taking Active   Turmeric 500 MG CAPS [id number] Yes Take 500 mg by mouth daily. [provider] Taking Active Self              Assessment/Plan:   Hypertension: - Currently uncontrolled per last office blood pressure.  - Reviewed long term cardiovascular and renal outcomes of uncontrolled blood pressure - Called cardiology office to see if they had a larger cuff that would work with her CAZON blood pressure monitor and they did not. Checked on line to see if [employer] has larger cuffs for this machine but not able to find one.  - Reviewed over-the-counter benefits. She can use to purchase a new blood pressure monitor with large cuff. Mailed patient [facility] over-the-counter catalog  - Recommend to continue amlodipine, clonidine, carvedilol and irbesartan. Once she starts checking blood pressure at home might need to consider adding back diuretic)  - Requested last nephrology visit notes and labs from [facility].    Hyperlipidemia/ASCVD Risk Reduction:Last LDL at goal but due to recheck.   - Reviewed long term complications of uncontrolled cholesterol - Recommend to take atorvastatin 40mg  every day   Medication Management: - Checked for medication assistance program for [place] but there is not currently a medication assistance program for either Gemtesa or Myrbetriq. There is a generic myrbetriq but cost is still > $100 per month which patient states is too high. She will continue oxybutynin and follow up with urologist about samples or possible alternatives.  - Provided over-the-counter catalog which has incontinence pad as possible item she can purchase with her quarterly over-the-counter benefits.    Follow Up Plan: 1 month; will see Dr Patsy Lager in 2 weeks.   Henrene Pastor, PharmD Clinical Pharmacist [facility]

## 2023-04-12 NOTE — Therapy (Signed)
+- OUTPATIENT OCCUPATIONAL THERAPY TREATMENT NOTE AND PROGRESS REPORT  LOWER EXTREMITY LYMPHEDEMA Patient Name: Emily Wood MRN: [id number] DOB:1951-07-06, 72 y.o., female Today's Date: 04/12/2023  END OF SESSION:   OT End of Session - 04/12/23 0807     Visit Number 40    Number of Visits 72    Date for OT Re-Evaluation 04/20/23    OT Start Time 0800    OT Stop Time 0900    OT Time Calculation (min) 60 min    Activity Tolerance Patient tolerated treatment well;No increased pain    Behavior During Therapy [facility] for tasks assessed/performed               Past Medical History:  Diagnosis Date   Arthritis    Diabetes mellitus without complication (HCC)    Family history of adverse reaction to anesthesia    PONV   Graves disease 04/14/2020   Hypertension    Lymphedema    Mixed hyperlipidemia 02/20/2021   Past Surgical History:  Procedure Laterality Date   CATARACT EXTRACTION W/PHACO Right 04/14/2022   Procedure: CATARACT EXTRACTION PHACO AND INTRAOCULAR LENS PLACEMENT (IOC) RIGHT DIABETIC 10.93 00:53.0;  Surgeon: Galen Manila, MD;  Location: [facility];  Service: Ophthalmology;  Laterality: Right;   CATARACT EXTRACTION W/PHACO Left 04/28/2022   Procedure: CATARACT EXTRACTION PHACO AND INTRAOCULAR LENS PLACEMENT (IOC) LEFT DIABETIC 6.05 00:45.7;  Surgeon: Galen Manila, MD;  Location: [facility];  Service: Ophthalmology;  Laterality: Left;  Diabetic   CHOLECYSTECTOMY     HERNIA REPAIR     OTHER SURGICAL HISTORY     scar tissue removal from bowels   PARTIAL HYSTERECTOMY     Still has ovaries   REVERSE SHOULDER ARTHROPLASTY Right 09/11/2022   Procedure: REVERSE SHOULDER ARTHROPLASTY;  Surgeon: Yolonda Kida, MD;  Location: [facility];  Service: Orthopedics;  Laterality: Right;  120   UTERINE FIBROID SURGERY     Patient Active Problem List   Diagnosis Date Noted   Normocytic anemia 05/23/2022   AKI (acute kidney injury) (HCC)  05/21/2022   Snoring 11/05/2021   Daytime somnolence 11/05/2021   Coronary artery disease involving native coronary artery of native heart without angina pectoris 05/15/2021   Nonrheumatic tricuspid valve regurgitation 05/15/2021   Pulmonary hypertension, unspecified (HCC) 05/15/2021   Mixed hyperlipidemia 02/20/2021   Obesity (BMI 30-39.9) 02/20/2021   Diabetic retinopathy (HCC) 08/28/2020   Mixed incontinence 05/31/2020   Graves disease 04/14/2020   Right renal mass 11/22/2019   Arthritis of left knee 09/25/2019   Lymphedema 01/25/2017   Fibroid uterus 01/14/2017   Chronic kidney disease, stage 3 (HCC) 09/21/2016   Meralgia paresthetica of left side 08/27/2015   Diabetes mellitus type 2, controlled (HCC) 08/02/2015   Chronic venous insufficiency 06/28/2015   Essential hypertension 02/15/2015   Peripheral edema 02/15/2015   Chronic knee pain 02/15/2015    PCP: Pearline Cables, MD  REFERRING PROVIDER: Sheppard Plumber, NP  REFERRING DIAG: I89.0  THERAPY DIAG:  Lymphedema, not elsewhere classified  Rationale for Evaluation and Treatment: Rehabilitation  ONSET DATE: insidious onset >30 years  SUBJECTIVE:  SUBJECTIVE STATEMENT: Mrs Brazil presents to Occupational Therapy for treatment of BLE lymphedema. She arrives with knee length, multilayer compression wraps in place on LLE and compression stocking in place on the R.  She is unaccompanied and walks to clinic. Pt denies LE-related leg pain, but endorses OA related R knee pain 10/10, despite recent gel injection.  PERTINENT HISTORY:  Medical Hx contributing to, or affected by chronic, progressive lymphedema: Norvasc prescription-common side effect is leg swelling; HTN, CVI, Obesity, CAD, OA knees, CKD stage 3, Graves Disease, DM  PAIN:   Are you having pain? Yes: R arthritis knee pain;  10/10 Pain description: stiff, aching, throbbing, tight, heavy Aggravating factors: standing , walking,  Relieving factors: "The medication from my cardiologist", elevation, compression stockings ( Pt has good hip AROM bilaterally and is able to don them herself w extra time in clinic.)  PRECAUTIONS: Fall and Other: LYMPHEDEMA PRECAUTIONS: (CKD, CAD and DM, GRAVES )   WEIGHT BEARING RESTRICTIONS: No  FALLS: Has patient fallen since last visit? NO    HAND DOMINANCE: right   PRIOR LEVEL OF FUNCTION: Requires assistive device for independence, Needs assistance with homemaking, and Needs assistance with gait  PATIENT GOALS: ".... to get this lymphedema under control. It's embarrassing. That's why I wear long skirts."   OBJECTIVE:   Mild, Stage  II, Bilateral Lower Extremity Lymphedema 2/2 CVI and Obesity  OBSERVATIONS / OTHER ASSESSMENTS:   Lymphedema Life Impact Scape (LLIS): Intake 11/03/22: 50% (The extent to which L:E-related problems affected your life during the past week)  FOTO functional outcome scale: Intake 11/16/22: 63%  BLE COMPARATIVE LIMB VOLUMETRICS:   Intake measurements completed 10/22/22  LANDMARK RIGHT   R LEG (A-D) 5043.4 ml  R THIGH (E-G) 4182.3 ml  R FULL LIMB (A-G) 7643.8 ml  Limb Volume differential (LVD)   Leg LVD =12.14%, L>R Thigh LVD 8.3%, L>R Full limb LVD= 8.3%   Volume change since initial %  Volume change overall V  (Blank rows = not tested)  LANDMARK LEFT    R LEG (A-D) 5656.0 ml  R THIGH (E-G) 4529.5 ml  R FULL LIMB (A-G) 10185.5 ml  Limb Volume differential (LVD)  %  Volume change since initial %  Volume change overall %  (Blank rows = not tested)   9th Visit: 12/07/22  [facility] RIGHT   R LEG (A-D) 5116.3 ml  R THIGH (E-G) 5047.6  ml  R FULL LIMB (A-G) 10163.9  ml  Limb Volume differential (LVD)    Volume change since initial R LEG volume INCREASED by 1.4%;  R THIGH INCREASED by  20.7%, and R FULL LIMB INCREASED by 10.17% since initial on 10/22/22.  Volume change overall V  (Blank rows = not tested)       19 th visit 01/18/23  LANDMARK RIGHT   R LEG (A-D) 4606.5 ml  R THIGH (E-G) 4617.0 ml  R FULL LIMB (A-G) 9224 ml  Limb Volume differential (LVD)    Volume change since initial Since last measured on 9th Rx visit on 12/07/22, R LEG volume is DECREASED by 9.96 %, R THIGH volume is DECREASED by 8.5 %, and R FULL LIMB volume is DECREASED by 9.25 %   Volume change overall Since initially measured on 10/22/22 on 1st Rx visit,  R LEG volume is DECREASED by 8.67 % overall, R THIGH volume is INCREASED by 10 %, and R FULL LIMB volume is DECREASED by 0.018 %    29 th Visit 03/02/23  LANDMARK RIGHT   R LEG (A-D) 5749.7 ml  R THIGH (E-G)  ml  R FULL LIMB (A-G)  ml  Limb Volume differential (LVD)    Volume change since last Since 01/18/23 R leg  is INCREASED by 15.15%.  Volume change overall   (Blank rows = not tested)  LANDMARK LEFT    L LEG (A-D) Since initial 11/11/22 L LEG is INCREASED by 1.66%.   L THIGH (E-G)   L FULL LIMB (A-G)   Limb Volume differential (LVD)    Volume change since initial +1.66%  Volume change overall    39 th Visit 04/07/23  LANDMARK LEFT  L  LEG (A-D) 5297.8  ml  L THIGH (E-G)  ml  L FULL LIMB (A-G)  ml  Limb Volume differential (LVD)    Volume change since last -7.9%  Volume change overall     TODAY'S TREATMENT:  LLE multilayer gradient compression wraps-knee high Pt edu for LE self-care and reviewed progress towards goals to date                  PATIENT EDUCATION:  Pt edu for LE self care:  Continued Pt/ CG edu for lymphedema self care home program throughout session. Emphasis today on lymphedema precautions and cellulitis ID, prevention and Rx. Discussed again fluid dynamics re elevation, and importance of elevation for lymphedema management and venous return. Other topics include outcome of comparative limb volumetrics- starting limb  volume differentials (LVDs), technology and gradient techniques used for short stretch, multilayer compression wrapping, simple self-MLD, therapeutic lymphatic pumping exercises, skin/nail care, LE precautions,. compression garment recommendations and specifications, wear and care schedule and compression garment donning / doffing w assistive devices. Discussed progress towards all OT goals since commencing CDT. All questions answered to the Pt's satisfaction. Good return. Person educated: Patient Education method: Explanation, Demonstration, Tactile cues, Verbal cues, and Handouts Education comprehension: verbalized understanding, returned demonstration, and needs further education  HOME EXERCISE PROGRAM: Intensive Phase Complete Decongestive Therapy (CDT) includes Daily Manual lymphatic drainage: Short neck sequence bilaterally w diaphragmatic breathing to engage deep abdominal lymphatics. Stationary J strokes to inguinal LN, then proximal to distal dynamic J strokes to thigh, "bottleneck" region, leg and foot. Finally 3 retrograde sweeps back to terminus. Daily Skin care w/ low ph lotion matching skin ph to limit infection risk and improve tissue flexibility and excursion Daily, BLE lymphatic pumping therex- seated or supine Daily (23/7) multilayer compression bandages below the knee to reduce limb volume, one limb at a time (One each 8 cm, 10 cm and 12 cm wide x 5 meters long short stretch compression wrap applied circumferentially using gradient layering techniques over single layer of cotton stockinett an Rosidal foam- toes to popliteal fossa  Self-management Phase CDT includes: Appropriate daytime compression garments and HOS devices full time daily. Multiples needed  for hygiene. Replace q 3-6 months Elvarex, flat knit, ccl 225-32 mmHg) knee highs w open toe, t heel, oblique top edge, 2.5 cm wide SB on top, tricot patch at anterior ankle sewn on all 4 sides. To be work full time waking  hours. Jobst RELAX knee high HOS device  Custom-made gradient compression garments and HOS devices are medically necessary in this case because they are uniquely sized and shaped to fit the exact dimensions of the affected extremities with deformities, and to provide accurate and consistent gradient compression and containment, essential to optimally managing this patient's symptoms of chronic, progressive lymphedema. Multiple custom compression garments are needed for  optimal hygiene to limit infection risk. Custom compression garments should be replaced q 3-6 months When worn consistently for optimal lipo-lymphedema self-management over time.   ASSESSMENT:  CLINICAL IMPRESSION: Completed anatomical measurements and finalized specifications for LLE custom compression gatrments and hours of sleep device. Faxed to DME vendor with instructions to complete order ASAP. Applied L knee length compression wraps as established.  Reviewed progress towards goals to date with patient. She continues to be > 85% compliant with all home program componenets and consistently attends therapy sessions. L LEG comparative limb volumetrics completed last session reveal a DECREASE in L LEG limb volume of 7.9% since least measured on 03/01/23. While this value does not yet meet 10% reduction goal for L LEG, it demonstrates a positive response to CDT and good progress. Please revirew GOALS sections for details on each goal. Applied L knee length, multi layer compression wraps as established. Cont as per POC.   OBJECTIVE IMPAIRMENTS: low vision, Abnormal gait, decreased activity tolerance, decreased balance, decreased endurance, decreased knowledge of condition, decreased knowledge of use of DME, decreased mobility, decreased ROM, increased edema, impaired flexibility, postural dysfunction, obesity, and pain.   ACTIVITY LIMITATIONS: functional mobility and ambulation (carrying, lifting, bending, sitting, standing, squatting,  sleeping, stairs, transfers, bed mobility, continence, Basic and instrumental ADLs (bathing, dressing, hygiene/grooming, Productive activities ( caring for others) and leisure pursuits, body image, and social participation  PERSONAL FACTORS:  multiple co morbidities, length of time with progressive condition, are also affecting patient's functional outcome.   REHAB POTENTIAL: Fair    EVALUATION COMPLEXITY: Moderate   GOALS: Goals reviewed with patient? Yes  SHORT TERM GOALS: Target date: 4th OT Rx visit   Pt will demonstrate understanding of lymphedema precautions and prevention strategies with modified independence using a printed reference to identify at least 5 precautions and discussing how s/he may implement them into daily life to reduce risk of progression with extra time (Modified Independence). Baseline:Max A Goal status: 12/07/22 GOAL MET  2.  Pt will be able to apply multilayer, thigh length, gradient, compression wraps to one leg at a time with modified assistance (extra time) to decrease limb volume, to limit infection risk, and to limit lymphedema progression.  Baseline: Dependent Goal status: 11/16/22 PROGRESSING 11/26/22 GOAL MET  LONG TERM GOALS: Target date: 02/01/23  Given this patient's Intake score of 63/100% on the functional outcomes FOTO tool, patient will experience an increase in function of 3 points to improve basic and instrumental ADLs performance, including lymphedema self-care.  Baseline: ONGOING   Goal status:12/07/22 GOAL ONGOING,  01/20/23 PROGRESSING 03/03/23 PROGRESSING; 04/12/23 PROGRESSING  2.  Given this patient's Intake score of 50.0 % on the Lymphedema Life Impact Scale (LLIS), patient will experience a reduction of at least 5 points in her perceived level of functional impairment resulting from lymphedema to improve functional performance and quality of life (QOL). Baseline: 50% Goal status: 12/07/22 GOAL ONGOING; 01/20/23 PROGRESSING,  03/03/23 PROGRESSING  04/12/23 PROGRESSING  3.  During Intensive phase CDT Pt will achieve at least 85% compliance with all lymphedema self-care home program components, including daily skin care, compression wraps and /or garments, simple self MLD and lymphatic pumping therex, and elevation when sitting to habituate LE self care protocol  into ADLs for optimal LE self-management over time. Baseline: Dependent Goal status: 12/08/22 ONGOING. Pt not changing wraps daily as directed, but will work on increasing elevation anytime she is seated for next progress report. Pt will also increase frequency of re-wrapping her leg to  ensure optimal volume reduction. GOAL MET 01/20/23  4.  Pt will achieve at least a 10% volume reduction below the knees to return limb to typical size and shape, to limit infection risk and LE progression, to decrease pain, to improve function. Baseline: dependent Goal status:  12/07/22 PROGRESSING 01/18/23 : Leg reduction= 8.67% since initial. Thigh  is Increased 10% since initial, And full LE is decreased by 0.018% since 11/11/22;   01/20/23 GOAL MET for R LEG w 9.96 % reduction  03/01/23 BLE comparative limb volumetrics reveal a  significant 15.15% increase in limb volume since last measured on 01/18/23. The LLE , the current Rx limb, is increased by 1.66% since initially measured in 4/24.   04/07/23 PROGRESSING with 7.9% reduction in the L leg since 03/01/23. See volumetric chart for additional details.  5.  Pt will obtain proper compression garments/compression braces and be modified independent with donning/doffing to optimize/stabilize with fluid reduction. Baseline:  Goal status: 12/07/22 PROGRESSING. 01/20/23 PROGRESSING.02/08/23 GOAL MET for RLE, 04/12/23 PROGRESSING for LLE  PLAN:  PT FREQUENCY: 2x/week  PT DURATION: 12 weeks  PLANNED INTERVENTIONS: Therapeutic exercises, Therapeutic activity, Patient/Family education, Self Care, Manual lymph drainage, Compression bandaging, scar mobilization, Manual  therapy, and skin care throughout MLD  PLAN FOR NEXT SESSION:  LLE compression garment measurements and specifications LLE multilayer compression wraps as established Cont Pt edu for LE self care and progress  to date   Loel Dubonnet, MS, OTR/L, CLT-LANA 04/12/23 9:03 AM`

## 2023-04-13 ENCOUNTER — Ambulatory Visit: Payer: Self-pay

## 2023-04-13 ENCOUNTER — Encounter: Payer: Medicare Other | Admitting: Occupational Therapy

## 2023-04-13 NOTE — Patient Outreach (Signed)
Care Coordination   Initial Visit Note   04/13/2023 Name: Emily Wood MRN: [id number] DOB: [birth year]  Emily Wood is a 72 y.o. year old female who sees Copland, Gwenlyn Found, MD for primary care. I spoke with  Emily Wood by phone today.  What matters to the patients health and wellness today?  Patient needs transportation. Patient has visual issues and has difficulty driving. Her husband looses wages when she has a medical appointment because he has to take off the entire day (truck driver). SW does discouraged patient from driving if she can't see, but patient feels she can drive safety during the daytime.   Goals Addressed             This Visit's Progress    Transportation       Interventions Today    Flowsheet Row Most Recent Value  Chronic Disease   Chronic disease during today's visit Diabetes, Chronic Kidney Disease/End Stage Renal Disease (ESRD), Hypertension (HTN)  General Interventions   General Interventions Discussed/Reviewed General Interventions Discussed, General Interventions Reviewed, Communication with, [employer] reports she had transportation last year but it stopped. Husband works and losses a day of work when she has appts. Pt plans to pick a new insurance plan w/transportation. Pt will use both [employer] & Insurance transportation.]  Doctor Visits Discussed/Reviewed --  [SW T/c [facility] & insurance doesnt provide transportation.SW t/c [employer] & pt is eligible.Tues to [place] and Fri to [employer] 9am-11am apt time. Pt can't go to eye md on Fri in [place].]              SDOH assessments and interventions completed:  Yes  SDOH Interventions Today    Flowsheet Row Most Recent Value  SDOH Interventions   Food Insecurity Interventions Intervention Not Indicated  Housing Interventions Intervention Not Indicated  Transportation Interventions Intervention Not Indicated, Other (Comment)  [Has a vehicle and husband assist. Struggle for  patient to drive due to visual issues]  Utilities Interventions Intervention Not Indicated        Care Coordination Interventions:  Yes, provided   Follow up plan: Follow up call scheduled for 04/22/23 at 10am.    Encounter Outcome:  Patient Visit Completed

## 2023-04-13 NOTE — Patient Instructions (Signed)
Visit Information  Thank you for taking time to visit with me today. Please don't hesitate to contact me if I can be of assistance to you.   Following are the goals we discussed today:  Patient plans to contact Medicare to select a different plan that provides transportation. Patient plans to use [employer] for transportation.  Our next appointment is by telephone on 04/22/23 at 10am  Please call the care guide team at [phone] if you need to cancel or reschedule your appointment.   If you are experiencing a Mental Health or Behavioral Health Crisis or need someone to talk to, please call 911  Patient verbalizes understanding of instructions and care plan provided today and agrees to view in MyChart. Active MyChart status and patient understanding of how to access instructions and care plan via MyChart confirmed with patient.     Telephone follow up appointment with care management team member scheduled for: 04/22/23 at 10am  Lysle Morales, BSW Social Worker [phone]

## 2023-04-14 ENCOUNTER — Encounter: Payer: Self-pay | Admitting: Occupational Therapy

## 2023-04-14 ENCOUNTER — Other Ambulatory Visit: Payer: Self-pay | Admitting: Cardiology

## 2023-04-14 ENCOUNTER — Ambulatory Visit: Payer: Medicare Other | Admitting: Occupational Therapy

## 2023-04-14 DIAGNOSIS — I890 Lymphedema, not elsewhere classified: Secondary | ICD-10-CM | POA: Diagnosis not present

## 2023-04-14 NOTE — Therapy (Signed)
+- OUTPATIENT OCCUPATIONAL THERAPY TREATMENT NOTE  LOWER EXTREMITY LYMPHEDEMA Patient Name: Emily Wood MRN: [id number] DOB:October 31, 1950, 72 y.o., female Today's Date: 04/14/2023  END OF SESSION:   OT End of Session - 04/14/23 0808     Visit Number 41    Number of Visits 72    Date for OT Re-Evaluation 04/20/23    OT Start Time 0800    Activity Tolerance Patient tolerated treatment well;No increased pain    Behavior During Therapy [facility] for tasks assessed/performed               Past Medical History:  Diagnosis Date   Arthritis    Diabetes mellitus without complication (HCC)    Family history of adverse reaction to anesthesia    PONV   Graves disease 04/14/2020   Hypertension    Lymphedema    Mixed hyperlipidemia 02/20/2021   Past Surgical History:  Procedure Laterality Date   CATARACT EXTRACTION W/PHACO Right 04/14/2022   Procedure: CATARACT EXTRACTION PHACO AND INTRAOCULAR LENS PLACEMENT (IOC) RIGHT DIABETIC 10.93 00:53.0;  Surgeon: Galen Manila, MD;  Location: [facility];  Service: Ophthalmology;  Laterality: Right;   CATARACT EXTRACTION W/PHACO Left 04/28/2022   Procedure: CATARACT EXTRACTION PHACO AND INTRAOCULAR LENS PLACEMENT (IOC) LEFT DIABETIC 6.05 00:45.7;  Surgeon: Galen Manila, MD;  Location: [facility];  Service: Ophthalmology;  Laterality: Left;  Diabetic   CHOLECYSTECTOMY     HERNIA REPAIR     OTHER SURGICAL HISTORY     scar tissue removal from bowels   PARTIAL HYSTERECTOMY     Still has ovaries   REVERSE SHOULDER ARTHROPLASTY Right 09/11/2022   Procedure: REVERSE SHOULDER ARTHROPLASTY;  Surgeon: Yolonda Kida, MD;  Location: [facility];  Service: Orthopedics;  Laterality: Right;  120   UTERINE FIBROID SURGERY     Patient Active Problem List   Diagnosis Date Noted   Normocytic anemia 05/23/2022   AKI (acute kidney injury) (HCC) 05/21/2022   Snoring 11/05/2021   Daytime somnolence 11/05/2021   Coronary artery  disease involving native coronary artery of native heart without angina pectoris 05/15/2021   Nonrheumatic tricuspid valve regurgitation 05/15/2021   Pulmonary hypertension, unspecified (HCC) 05/15/2021   Mixed hyperlipidemia 02/20/2021   Obesity (BMI 30-39.9) 02/20/2021   Diabetic retinopathy (HCC) 08/28/2020   Mixed incontinence 05/31/2020   Graves disease 04/14/2020   Right renal mass 11/22/2019   Arthritis of left knee 09/25/2019   Lymphedema 01/25/2017   Fibroid uterus 01/14/2017   Chronic kidney disease, stage 3 (HCC) 09/21/2016   Meralgia paresthetica of left side 08/27/2015   Diabetes mellitus type 2, controlled (HCC) 08/02/2015   Chronic venous insufficiency 06/28/2015   Essential hypertension 02/15/2015   Peripheral edema 02/15/2015   Chronic knee pain 02/15/2015    PCP: Pearline Cables, MD  REFERRING PROVIDER: Sheppard Plumber, NP  REFERRING DIAG: I89.0  THERAPY DIAG:  Lymphedema, not elsewhere classified  Rationale for Evaluation and Treatment: Rehabilitation  ONSET DATE: insidious onset >30 years  SUBJECTIVE:  SUBJECTIVE STATEMENT: [name] presents to Occupational Therapy for treatment of BLE lymphedema. She arrives with knee length, multilayer compression wraps in place on LLE and compression stocking in place on the R.  She is unaccompanied and walks to clinic. Pt denies LE-related leg pain, but endorses OA related R knee pain 7/10, despite recent gel injection. Pt reports she forgot her clean bandages. She reports she got a cramp behind her R knee in the middle of the night. She states her thigh above the R knee feels hard and full. We discussed the impact of arthritis on the chronic lymphatic response throughout the session.  PERTINENT HISTORY:  Medical Hx contributing to,  or affected by chronic, progressive lymphedema: Norvasc prescription-common side effect is leg swelling; HTN, CVI, Obesity, CAD, OA knees, CKD stage 3, Graves Disease, DM  PAIN:  Are you having pain? Yes: R arthritis knee pain;  6/10 Pain description: stiff, aching, throbbing, tight, heavy Aggravating factors: standing , walking,  Relieving factors: "The medication from my cardiologist", elevation, compression stockings ( Pt has good hip AROM bilaterally and is able to don them herself w extra time in clinic.)  PRECAUTIONS: Fall and Other: LYMPHEDEMA PRECAUTIONS: (CKD, CAD and DM, GRAVES )   WEIGHT BEARING RESTRICTIONS: No  FALLS: Has patient fallen since last visit? NO    HAND DOMINANCE: right   PRIOR LEVEL OF FUNCTION: Requires assistive device for independence, Needs assistance with homemaking, and Needs assistance with gait  PATIENT GOALS: ".... to get this lymphedema under control. It's embarrassing. That's why I wear long skirts."   OBJECTIVE:   Mild, Stage  II, Bilateral Lower Extremity Lymphedema 2/2 CVI and Obesity  OBSERVATIONS / OTHER ASSESSMENTS:   Lymphedema Life Impact Scape (LLIS): Intake 11/03/22: 50% (The extent to which L:E-related problems affected your life during the past week)  FOTO functional outcome scale: Intake 11/16/22: 63%  BLE COMPARATIVE LIMB VOLUMETRICS:   Intake measurements completed 10/22/22  LANDMARK RIGHT   R LEG (A-D) 5043.4 ml  R THIGH (E-G) 4182.3 ml  R FULL LIMB (A-G) 7643.8 ml  Limb Volume differential (LVD)   Leg LVD =12.14%, L>R Thigh LVD 8.3%, L>R Full limb LVD= 8.3%   Volume change since initial %  Volume change overall V  (Blank rows = not tested)  LANDMARK LEFT    R LEG (A-D) 5656.0 ml  R THIGH (E-G) 4529.5 ml  R FULL LIMB (A-G) 10185.5 ml  Limb Volume differential (LVD)  %  Volume change since initial %  Volume change overall %  (Blank rows = not tested)   9th Visit: 12/07/22  [facility] RIGHT   R LEG (A-D) 5116.3  ml  R THIGH (E-G) 5047.6  ml  R FULL LIMB (A-G) 10163.9  ml  Limb Volume differential (LVD)    Volume change since initial R LEG volume INCREASED by 1.4%;  R THIGH INCREASED by 20.7%, and R FULL LIMB INCREASED by 10.17% since initial on 10/22/22.  Volume change overall V  (Blank rows = not tested)       19 th visit 01/18/23  LANDMARK RIGHT   R LEG (A-D) 4606.5 ml  R THIGH (E-G) 4617.0 ml  R FULL LIMB (A-G) 9224 ml  Limb Volume differential (LVD)    Volume change since initial Since last measured on 9th Rx visit on 12/07/22, R LEG volume is DECREASED by 9.96 %, R THIGH volume is DECREASED by 8.5 %, and R FULL LIMB volume is DECREASED by 9.25 %  Volume change overall Since initially measured on 10/22/22 on 1st Rx visit,  R LEG volume is DECREASED by 8.67 % overall, R THIGH volume is INCREASED by 10 %, and R FULL LIMB volume is DECREASED by 0.018 %    29 th Visit 03/02/23   LANDMARK RIGHT   R LEG (A-D) 5749.7 ml  R THIGH (E-G)  ml  R FULL LIMB (A-G)  ml  Limb Volume differential (LVD)    Volume change since last Since 01/18/23 R leg  is INCREASED by 15.15%.  Volume change overall   (Blank rows = not tested)  LANDMARK LEFT    L LEG (A-D) Since initial 11/11/22 L LEG is INCREASED by 1.66%.   L THIGH (E-G)   L FULL LIMB (A-G)   Limb Volume differential (LVD)    Volume change since initial +1.66%  Volume change overall    39 th Visit 04/07/23  LANDMARK LEFT  L  LEG (A-D) 5297.8  ml  L THIGH (E-G)  ml  L FULL LIMB (A-G)  ml  Limb Volume differential (LVD)    Volume change since last -7.9%  Volume change overall     TODAY'S TREATMENT:  LLE multilayer gradient compression wraps-knee high Pt edu for LE self-care and reviewed progress towards goals to date                  PATIENT EDUCATION:  Pt edu for LE self care:  Continued Pt/ CG edu for lymphedema self care home program throughout session. Emphasis today on lymphedema precautions and cellulitis ID, prevention and Rx. Discussed  again fluid dynamics re elevation, and importance of elevation for lymphedema management and venous return. Other topics include outcome of comparative limb volumetrics- starting limb volume differentials (LVDs), technology and gradient techniques used for short stretch, multilayer compression wrapping, simple self-MLD, therapeutic lymphatic pumping exercises, skin/nail care, LE precautions,. compression garment recommendations and specifications, wear and care schedule and compression garment donning / doffing w assistive devices. Discussed progress towards all OT goals since commencing CDT. All questions answered to the Pt's satisfaction. Good return. Person educated: Patient Education method: Explanation, Demonstration, Tactile cues, Verbal cues, and Handouts Education comprehension: verbalized understanding, returned demonstration, and needs further education  HOME EXERCISE PROGRAM: Intensive Phase Complete Decongestive Therapy (CDT) includes Daily Manual lymphatic drainage: Short neck sequence bilaterally w diaphragmatic breathing to engage deep abdominal lymphatics. Stationary J strokes to inguinal LN, then proximal to distal dynamic J strokes to thigh, "bottleneck" region, leg and foot. Finally 3 retrograde sweeps back to terminus. Daily Skin care w/ low ph lotion matching skin ph to limit infection risk and improve tissue flexibility and excursion Daily, BLE lymphatic pumping therex- seated or supine Daily (23/7) multilayer compression bandages below the knee to reduce limb volume, one limb at a time (One each 8 cm, 10 cm and 12 cm wide x 5 meters long short stretch compression wrap applied circumferentially using gradient layering techniques over single layer of cotton stockinett an Rosidal foam- toes to popliteal fossa  Self-management Phase CDT includes: Appropriate daytime compression garments and HOS devices full time daily. Multiples needed  for hygiene. Replace q 3-6 months Elvarex, flat  knit, ccl 225-32 mmHg) knee highs w open toe, t heel, oblique top edge, 2.5 cm wide SB on top, tricot patch at anterior ankle sewn on all 4 sides. To be work full time waking hours. Jobst RELAX knee high HOS device  Custom-made gradient compression garments and HOS devices are medically necessary  in this case because they are uniquely sized and shaped to fit the exact dimensions of the affected extremities with deformities, and to provide accurate and consistent gradient compression and containment, essential to optimally managing this patient's symptoms of chronic, progressive lymphedema. Multiple custom compression garments are needed for optimal hygiene to limit infection risk. Custom compression garments should be replaced q 3-6 months When worn consistently for optimal lipo-lymphedema self-management over time.   ASSESSMENT:  CLINICAL IMPRESSION: Pt's legs and thighs more swollen than is typical this morning. Pt denies feelings of abdominal swelling. Pt instructed to weigh herself each morning upon rising and take data to her upcoming PCP appointment to track fluctuation and possible systemic fluid   retention. Continued MLD to LLE/LLQ with simultaneous skin care w emphasis on decongestion above te knee. Pt tolerated compression wraps below the knee without difficulty. Cont as per POC.   OBJECTIVE IMPAIRMENTS: low vision, Abnormal gait, decreased activity tolerance, decreased balance, decreased endurance, decreased knowledge of condition, decreased knowledge of use of DME, decreased mobility, decreased ROM, increased edema, impaired flexibility, postural dysfunction, obesity, and pain.   ACTIVITY LIMITATIONS: functional mobility and ambulation (carrying, lifting, bending, sitting, standing, squatting, sleeping, stairs, transfers, bed mobility, continence, Basic and instrumental ADLs (bathing, dressing, hygiene/grooming, Productive activities ( caring for others) and leisure pursuits, body image, and  social participation  PERSONAL FACTORS:  multiple co morbidities, length of time with progressive condition, are also affecting patient's functional outcome.   REHAB POTENTIAL: Fair    EVALUATION COMPLEXITY: Moderate   GOALS: Goals reviewed with patient? Yes  SHORT TERM GOALS: Target date: 4th OT Rx visit   Pt will demonstrate understanding of lymphedema precautions and prevention strategies with modified independence using a printed reference to identify at least 5 precautions and discussing how s/he may implement them into daily life to reduce risk of progression with extra time (Modified Independence). Baseline:Max A Goal status: 12/07/22 GOAL MET  2.  Pt will be able to apply multilayer, thigh length, gradient, compression wraps to one leg at a time with modified assistance (extra time) to decrease limb volume, to limit infection risk, and to limit lymphedema progression.  Baseline: Dependent Goal status: 11/16/22 PROGRESSING 11/26/22 GOAL MET  LONG TERM GOALS: Target date: 02/01/23  Given this patient's Intake score of 63/100% on the functional outcomes FOTO tool, patient will experience an increase in function of 3 points to improve basic and instrumental ADLs performance, including lymphedema self-care.  Baseline: ONGOING   Goal status:12/07/22 GOAL ONGOING,  01/20/23 PROGRESSING 03/03/23 PROGRESSING; 04/12/23 PROGRESSING  2.  Given this patient's Intake score of 50.0 % on the Lymphedema Life Impact Scale (LLIS), patient will experience a reduction of at least 5 points in her perceived level of functional impairment resulting from lymphedema to improve functional performance and quality of life (QOL). Baseline: 50% Goal status: 12/07/22 GOAL ONGOING; 01/20/23 PROGRESSING,  03/03/23 PROGRESSING 04/12/23 PROGRESSING  3.  During Intensive phase CDT Pt will achieve at least 85% compliance with all lymphedema self-care home program components, including daily skin care, compression wraps and /or  garments, simple self MLD and lymphatic pumping therex, and elevation when sitting to habituate LE self care protocol  into ADLs for optimal LE self-management over time. Baseline: Dependent Goal status: 12/08/22 ONGOING. Pt not changing wraps daily as directed, but will work on increasing elevation anytime she is seated for next progress report. Pt will also increase frequency of re-wrapping her leg to ensure optimal volume reduction. GOAL MET 01/20/23  4.  Pt will achieve at least a 10% volume reduction below the knees to return limb to typical size and shape, to limit infection risk and LE progression, to decrease pain, to improve function. Baseline: dependent Goal status:  12/07/22 PROGRESSING 01/18/23 : Leg reduction= 8.67% since initial. Thigh  is Increased 10% since initial, And full LE is decreased by 0.018% since 11/11/22;   01/20/23 GOAL MET for R LEG w 9.96 % reduction  03/01/23 BLE comparative limb volumetrics reveal a  significant 15.15% increase in limb volume since last measured on 01/18/23. The LLE , the current Rx limb, is increased by 1.66% since initially measured in 4/24.   04/07/23 PROGRESSING with 7.9% reduction in the L leg since 03/01/23. See volumetric chart for additional details.  5.  Pt will obtain proper compression garments/compression braces and be modified independent with donning/doffing to optimize/stabilize with fluid reduction. Baseline:  Goal status: 12/07/22 PROGRESSING. 01/20/23 PROGRESSING.02/08/23 GOAL MET for RLE, 04/12/23 PROGRESSING for LLE  PLAN:  PT FREQUENCY: 2x/week  PT DURATION: 12 weeks  PLANNED INTERVENTIONS: Therapeutic exercises, Therapeutic activity, Patient/Family education, Self Care, Manual lymph drainage, Compression bandaging, scar mobilization, Manual therapy, and skin care throughout MLD  PLAN FOR NEXT SESSION:  MLD to LLE as established with simultaneous skin care LLE multilayer compression wraps as established Cont Pt edu for LE self care and  progress  to date   Loel Dubonnet, MS, OTR/L, CLT-LANA 04/14/23 8:09 AM`

## 2023-04-15 ENCOUNTER — Encounter: Payer: Medicare Other | Admitting: Occupational Therapy

## 2023-04-19 ENCOUNTER — Ambulatory Visit: Payer: Medicare Other | Admitting: Occupational Therapy

## 2023-04-19 DIAGNOSIS — I890 Lymphedema, not elsewhere classified: Secondary | ICD-10-CM | POA: Diagnosis not present

## 2023-04-20 NOTE — Therapy (Signed)
+- OUTPATIENT OCCUPATIONAL THERAPY TREATMENT NOTE  LOWER EXTREMITY LYMPHEDEMA Patient Name: NANDI TEAHAN MRN: [id number] DOB:1950-07-26, 72 y.o., female Today's Date: 04/20/2023  END OF SESSION:   OT End of Session - 04/19/23 0820     Visit Number 42    Number of Visits 72    Date for OT Re-Evaluation 04/20/23    OT Start Time 0805    OT Stop Time 0905    OT Time Calculation (min) 60 min    Activity Tolerance Patient tolerated treatment well;No increased pain    Behavior During Therapy [facility] for tasks assessed/performed               Past Medical History:  Diagnosis Date   Arthritis    Diabetes mellitus without complication (HCC)    Family history of adverse reaction to anesthesia    PONV   Graves disease 04/14/2020   Hypertension    Lymphedema    Mixed hyperlipidemia 02/20/2021   Past Surgical History:  Procedure Laterality Date   CATARACT EXTRACTION W/PHACO Right 04/14/2022   Procedure: CATARACT EXTRACTION PHACO AND INTRAOCULAR LENS PLACEMENT (IOC) RIGHT DIABETIC 10.93 00:53.0;  Surgeon: Galen Manila, MD;  Location: [facility];  Service: Ophthalmology;  Laterality: Right;   CATARACT EXTRACTION W/PHACO Left 04/28/2022   Procedure: CATARACT EXTRACTION PHACO AND INTRAOCULAR LENS PLACEMENT (IOC) LEFT DIABETIC 6.05 00:45.7;  Surgeon: Galen Manila, MD;  Location: [facility];  Service: Ophthalmology;  Laterality: Left;  Diabetic   CHOLECYSTECTOMY     HERNIA REPAIR     OTHER SURGICAL HISTORY     scar tissue removal from bowels   PARTIAL HYSTERECTOMY     Still has ovaries   REVERSE SHOULDER ARTHROPLASTY Right 09/11/2022   Procedure: REVERSE SHOULDER ARTHROPLASTY;  Surgeon: Yolonda Kida, MD;  Location: [facility];  Service: Orthopedics;  Laterality: Right;  120   UTERINE FIBROID SURGERY     Patient Active Problem List   Diagnosis Date Noted   Normocytic anemia 05/23/2022   AKI (acute kidney injury) (HCC) 05/21/2022   Snoring  11/05/2021   Daytime somnolence 11/05/2021   Coronary artery disease involving native coronary artery of native heart without angina pectoris 05/15/2021   Nonrheumatic tricuspid valve regurgitation 05/15/2021   Pulmonary hypertension, unspecified (HCC) 05/15/2021   Mixed hyperlipidemia 02/20/2021   Obesity (BMI 30-39.9) 02/20/2021   Diabetic retinopathy (HCC) 08/28/2020   Mixed incontinence 05/31/2020   Graves disease 04/14/2020   Right renal mass 11/22/2019   Arthritis of left knee 09/25/2019   Lymphedema 01/25/2017   Fibroid uterus 01/14/2017   Chronic kidney disease, stage 3 (HCC) 09/21/2016   Meralgia paresthetica of left side 08/27/2015   Diabetes mellitus type 2, controlled (HCC) 08/02/2015   Chronic venous insufficiency 06/28/2015   Essential hypertension 02/15/2015   Peripheral edema 02/15/2015   Chronic knee pain 02/15/2015    PCP: Pearline Cables, MD  REFERRING PROVIDER: Sheppard Plumber, NP  REFERRING DIAG: I89.0  THERAPY DIAG:  Lymphedema, not elsewhere classified  Rationale for Evaluation and Treatment: Rehabilitation  ONSET DATE: insidious onset >30 years  SUBJECTIVE:  SUBJECTIVE STATEMENT: Mrs Jagiello presents to Occupational Therapy for treatment of BLE lymphedema. She arrives with knee length, multilayer compression wraps in place on LLE and compression stocking in place on the R.  She is unaccompanied and walks to clinic. Pt denies LE-related leg pain, but endorses OA related R knee pain 7/10, despite recent gel injection. Pt reports she forgot her clean bandages. She reports she got a cramp behind her R knee in the middle of the night. She states her thigh above the R knee feels hard and full. We discussed the impact of arthritis on the chronic lymphatic response throughout  the session.  PERTINENT HISTORY:  Medical Hx contributing to, or affected by chronic, progressive lymphedema: Norvasc prescription-common side effect is leg swelling; HTN, CVI, Obesity, CAD, OA knees, CKD stage 3, Graves Disease, DM  PAIN:  Are you having pain? Yes: R arthritis knee pain;  6/10 Pain description: stiff, aching, throbbing, tight, heavy Aggravating factors: standing , walking,  Relieving factors: "The medication from my cardiologist", elevation, compression stockings ( Pt has good hip AROM bilaterally and is able to don them herself w extra time in clinic.)  PRECAUTIONS: Fall and Other: LYMPHEDEMA PRECAUTIONS: (CKD, CAD and DM, GRAVES )   WEIGHT BEARING RESTRICTIONS: No  FALLS: Has patient fallen since last visit? NO    HAND DOMINANCE: right   PRIOR LEVEL OF FUNCTION: Requires assistive device for independence, Needs assistance with homemaking, and Needs assistance with gait  PATIENT GOALS: ".... to get this lymphedema under control. It's embarrassing. That's why I wear long skirts."   OBJECTIVE:   Mild, Stage  II, Bilateral Lower Extremity Lymphedema 2/2 CVI and Obesity  OBSERVATIONS / OTHER ASSESSMENTS:   Lymphedema Life Impact Scape (LLIS): Intake 11/03/22: 50% (The extent to which L:E-related problems affected your life during the past week)  FOTO functional outcome scale: Intake 11/16/22: 63%  BLE COMPARATIVE LIMB VOLUMETRICS:   Intake measurements completed 10/22/22  LANDMARK RIGHT   R LEG (A-D) 5043.4 ml  R THIGH (E-G) 4182.3 ml  R FULL LIMB (A-G) 7643.8 ml  Limb Volume differential (LVD)   Leg LVD =12.14%, L>R Thigh LVD 8.3%, L>R Full limb LVD= 8.3%   Volume change since initial %  Volume change overall V  (Blank rows = not tested)  LANDMARK LEFT    R LEG (A-D) 5656.0 ml  R THIGH (E-G) 4529.5 ml  R FULL LIMB (A-G) 10185.5 ml  Limb Volume differential (LVD)  %  Volume change since initial %  Volume change overall %  (Blank rows = not  tested)   9th Visit: 12/07/22  [facility] RIGHT   R LEG (A-D) 5116.3 ml  R THIGH (E-G) 5047.6  ml  R FULL LIMB (A-G) 10163.9  ml  Limb Volume differential (LVD)    Volume change since initial R LEG volume INCREASED by 1.4%;  R THIGH INCREASED by 20.7%, and R FULL LIMB INCREASED by 10.17% since initial on 10/22/22.  Volume change overall V  (Blank rows = not tested)       19 th visit 01/18/23  LANDMARK RIGHT   R LEG (A-D) 4606.5 ml  R THIGH (E-G) 4617.0 ml  R FULL LIMB (A-G) 9224 ml  Limb Volume differential (LVD)    Volume change since initial Since last measured on 9th Rx visit on 12/07/22, R LEG volume is DECREASED by 9.96 %, R THIGH volume is DECREASED by 8.5 %, and R FULL LIMB volume is DECREASED by 9.25 %  Volume change overall Since initially measured on 10/22/22 on 1st Rx visit,  R LEG volume is DECREASED by 8.67 % overall, R THIGH volume is INCREASED by 10 %, and R FULL LIMB volume is DECREASED by 0.018 %    29 th Visit 03/02/23   LANDMARK RIGHT   R LEG (A-D) 5749.7 ml  R THIGH (E-G)  ml  R FULL LIMB (A-G)  ml  Limb Volume differential (LVD)    Volume change since last Since 01/18/23 R leg  is INCREASED by 15.15%.  Volume change overall   (Blank rows = not tested)  LANDMARK LEFT    L LEG (A-D) Since initial 11/11/22 L LEG is INCREASED by 1.66%.   L THIGH (E-G)   L FULL LIMB (A-G)   Limb Volume differential (LVD)    Volume change since initial +1.66%  Volume change overall    39 th Visit 04/07/23  LANDMARK LEFT  L  LEG (A-D) 5297.8  ml  L THIGH (E-G)  ml  L FULL LIMB (A-G)  ml  Limb Volume differential (LVD)    Volume change since last -7.9%  Volume change overall     TODAY'S TREATMENT:  LLE multilayer gradient compression wraps-knee high Pt edu for LE self-care and reviewed progress towards goals to date                  PATIENT EDUCATION:  Pt edu for LE self care:  Continued Pt/ CG edu for lymphedema self care home program throughout session. Emphasis today on  lymphedema precautions and cellulitis ID, prevention and Rx. Discussed again fluid dynamics re elevation, and importance of elevation for lymphedema management and venous return. Other topics include outcome of comparative limb volumetrics- starting limb volume differentials (LVDs), technology and gradient techniques used for short stretch, multilayer compression wrapping, simple self-MLD, therapeutic lymphatic pumping exercises, skin/nail care, LE precautions,. compression garment recommendations and specifications, wear and care schedule and compression garment donning / doffing w assistive devices. Discussed progress towards all OT goals since commencing CDT. All questions answered to the Pt's satisfaction. Good return. Person educated: Patient Education method: Explanation, Demonstration, Tactile cues, Verbal cues, and Handouts Education comprehension: verbalized understanding, returned demonstration, and needs further education  HOME EXERCISE PROGRAM: Intensive Phase Complete Decongestive Therapy (CDT) includes Daily Manual lymphatic drainage: Short neck sequence bilaterally w diaphragmatic breathing to engage deep abdominal lymphatics. Stationary J strokes to inguinal LN, then proximal to distal dynamic J strokes to thigh, "bottleneck" region, leg and foot. Finally 3 retrograde sweeps back to terminus. Daily Skin care w/ low ph lotion matching skin ph to limit infection risk and improve tissue flexibility and excursion Daily, BLE lymphatic pumping therex- seated or supine Daily (23/7) multilayer compression bandages below the knee to reduce limb volume, one limb at a time (One each 8 cm, 10 cm and 12 cm wide x 5 meters long short stretch compression wrap applied circumferentially using gradient layering techniques over single layer of cotton stockinett an Rosidal foam- toes to popliteal fossa  Self-management Phase CDT includes: Appropriate daytime compression garments and HOS devices full time  daily. Multiples needed  for hygiene. Replace q 3-6 months Elvarex, flat knit, ccl 225-32 mmHg) knee highs w open toe, t heel, oblique top edge, 2.5 cm wide SB on top, tricot patch at anterior ankle sewn on all 4 sides. To be work full time waking hours. Jobst RELAX knee high HOS device  Custom-made gradient compression garments and HOS devices are medically necessary  in this case because they are uniquely sized and shaped to fit the exact dimensions of the affected extremities with deformities, and to provide accurate and consistent gradient compression and containment, essential to optimally managing this patient's symptoms of chronic, progressive lymphedema. Multiple custom compression garments are needed for optimal hygiene to limit infection risk. Custom compression garments should be replaced q 3-6 months When worn consistently for optimal lipo-lymphedema self-management over time.   ASSESSMENT:  CLINICAL IMPRESSION: Continued MLD to LLE/LLQ with simultaneous skin care w emphasis on decongestion above te knee. Pt tolerated compression wraps below the knee without difficulty. Pt in agreement with plan to reduce OT frequency to 1 x weekly in effort to conserve visits while sustaining clinical gains and awaiting custom garment delivery.    OBJECTIVE IMPAIRMENTS: low vision, Abnormal gait, decreased activity tolerance, decreased balance, decreased endurance, decreased knowledge of condition, decreased knowledge of use of DME, decreased mobility, decreased ROM, increased edema, impaired flexibility, postural dysfunction, obesity, and pain.   ACTIVITY LIMITATIONS: functional mobility and ambulation (carrying, lifting, bending, sitting, standing, squatting, sleeping, stairs, transfers, bed mobility, continence, Basic and instrumental ADLs (bathing, dressing, hygiene/grooming, Productive activities ( caring for others) and leisure pursuits, body image, and social participation  PERSONAL FACTORS:   multiple co morbidities, length of time with progressive condition, are also affecting patient's functional outcome.   REHAB POTENTIAL: Fair    EVALUATION COMPLEXITY: Moderate   GOALS: Goals reviewed with patient? Yes  SHORT TERM GOALS: Target date: 4th OT Rx visit   Pt will demonstrate understanding of lymphedema precautions and prevention strategies with modified independence using a printed reference to identify at least 5 precautions and discussing how s/he may implement them into daily life to reduce risk of progression with extra time (Modified Independence). Baseline:Max A Goal status: 12/07/22 GOAL MET  2.  Pt will be able to apply multilayer, thigh length, gradient, compression wraps to one leg at a time with modified assistance (extra time) to decrease limb volume, to limit infection risk, and to limit lymphedema progression.  Baseline: Dependent Goal status: 11/16/22 PROGRESSING 11/26/22 GOAL MET  LONG TERM GOALS: Target date: 02/01/23  Given this patient's Intake score of 63/100% on the functional outcomes FOTO tool, patient will experience an increase in function of 3 points to improve basic and instrumental ADLs performance, including lymphedema self-care.  Baseline: ONGOING   Goal status:12/07/22 GOAL ONGOING,  01/20/23 PROGRESSING 03/03/23 PROGRESSING; 04/12/23 PROGRESSING  2.  Given this patient's Intake score of 50.0 % on the Lymphedema Life Impact Scale (LLIS), patient will experience a reduction of at least 5 points in her perceived level of functional impairment resulting from lymphedema to improve functional performance and quality of life (QOL). Baseline: 50% Goal status: 12/07/22 GOAL ONGOING; 01/20/23 PROGRESSING,  03/03/23 PROGRESSING 04/12/23 PROGRESSING  3.  During Intensive phase CDT Pt will achieve at least 85% compliance with all lymphedema self-care home program components, including daily skin care, compression wraps and /or garments, simple self MLD and lymphatic  pumping therex, and elevation when sitting to habituate LE self care protocol  into ADLs for optimal LE self-management over time. Baseline: Dependent Goal status: 12/08/22 ONGOING. Pt not changing wraps daily as directed, but will work on increasing elevation anytime she is seated for next progress report. Pt will also increase frequency of re-wrapping her leg to ensure optimal volume reduction. GOAL MET 01/20/23  4.  Pt will achieve at least a 10% volume reduction below the knees to return limb to typical size  and shape, to limit infection risk and LE progression, to decrease pain, to improve function. Baseline: dependent Goal status:  12/07/22 PROGRESSING 01/18/23 : Leg reduction= 8.67% since initial. Thigh  is Increased 10% since initial, And full LE is decreased by 0.018% since 11/11/22;   01/20/23 GOAL MET for R LEG w 9.96 % reduction  03/01/23 BLE comparative limb volumetrics reveal a  significant 15.15% increase in limb volume since last measured on 01/18/23. The LLE , the current Rx limb, is increased by 1.66% since initially measured in 4/24.   04/07/23 PROGRESSING with 7.9% reduction in the L leg since 03/01/23. See volumetric chart for additional details.  5.  Pt will obtain proper compression garments/compression braces and be modified independent with donning/doffing to optimize/stabilize with fluid reduction. Baseline:  Goal status: 12/07/22 PROGRESSING. 01/20/23 PROGRESSING.02/08/23 GOAL MET for RLE, 04/12/23 PROGRESSING for LLE  PLAN:  PT FREQUENCY: 2x/week  PT DURATION: 12 weeks  PLANNED INTERVENTIONS: Therapeutic exercises, Therapeutic activity, Patient/Family education, Self Care, Manual lymph drainage, Compression bandaging, scar mobilization, Manual therapy, and skin care throughout MLD  PLAN FOR NEXT SESSION:  MLD to LLE as established with simultaneous skin care LLE multilayer compression wraps as established Cont Pt edu for LE self care and progress  to date   Loel Dubonnet,  MS, OTR/L, CLT-LANA 04/20/23 8:06 AM`

## 2023-04-21 ENCOUNTER — Ambulatory Visit: Payer: Medicare Other | Admitting: Occupational Therapy

## 2023-04-21 ENCOUNTER — Telehealth: Payer: Self-pay

## 2023-04-21 NOTE — Patient Outreach (Signed)
Care Coordination   04/21/2023 Name: Emily Wood MRN: [id number] DOB: 06/18/1951   Care Coordination Outreach Attempts:  An unsuccessful telephone outreach was attempted for a scheduled appointment today.  Follow Up Plan:  Additional outreach attempts will be made to offer the patient care coordination information and services.   Encounter Outcome:  No Answer   Care Coordination Interventions:  No, not indicated    Kathyrn Sheriff, RN, MSN, BSN, CCM Care Management Coordinator [phone]

## 2023-04-22 ENCOUNTER — Ambulatory Visit: Payer: Self-pay

## 2023-04-22 NOTE — Patient Outreach (Signed)
Care Coordination   04/22/2023 Name: LYNETTE PETTIT MRN: [id number] DOB: 02-15-1951   Care Coordination Outreach Attempts:  An unsuccessful telephone outreach was attempted for a scheduled appointment today.  Follow Up Plan:  Additional outreach attempts will be made to offer the patient care coordination information and services.   Encounter Outcome:  No Answer   Care Coordination Interventions:  No, not indicated     Lysle Morales, BSW Social Worker [phone]

## 2023-04-23 ENCOUNTER — Other Ambulatory Visit: Payer: Self-pay | Admitting: Family Medicine

## 2023-04-23 DIAGNOSIS — R600 Localized edema: Secondary | ICD-10-CM

## 2023-04-23 DIAGNOSIS — M5432 Sciatica, left side: Secondary | ICD-10-CM

## 2023-04-24 ENCOUNTER — Other Ambulatory Visit: Payer: Self-pay | Admitting: Family Medicine

## 2023-04-24 DIAGNOSIS — I10 Essential (primary) hypertension: Secondary | ICD-10-CM

## 2023-04-26 ENCOUNTER — Ambulatory Visit: Payer: Medicare Other | Admitting: Occupational Therapy

## 2023-04-27 ENCOUNTER — Telehealth: Payer: Self-pay | Admitting: *Deleted

## 2023-04-27 NOTE — Progress Notes (Signed)
Care Coordination Note  04/27/2023 Name: Emily Wood MRN: [id number] DOB: May 19, 1951  Emily Wood is a 72 y.o. year old female who is a primary care patient of Copland, Gwenlyn Found, MD and is actively engaged with the care management team. I reached out to [name] by phone today to assist with re-scheduling a follow up visit with the RN Case Manager and BSW  Follow up plan: Unsuccessful telephone outreach attempt made. A HIPAA compliant phone message was left for the patient providing contact information and requesting a return call.   Burman Nieves, CCMA Care Coordination Care Guide Direct Dial: [phone]

## 2023-04-28 ENCOUNTER — Ambulatory Visit: Payer: Medicare Other | Admitting: Occupational Therapy

## 2023-05-02 NOTE — Patient Instructions (Incomplete)
It was good to see again today, I will be in touch with your lab work  Let's have you try taking 5 mg total of amlodipine- I think this may help bring your BP down a bit.  Let me know if your swelling seems worse with the addition of this medication.  Please let me know what your home nurse has to say about your BP when they visit soon   We will see you soon for a nurse visit to check your BP Otherwise, assuming all is well we can plan to visit in 6 months or so   Recommend covid booster this fall and Shingles vaccine series at your convenience- both available at your drug store

## 2023-05-02 NOTE — Progress Notes (Unsigned)
[facility] [address] [place], [place] [address] [phone]  Date:  05/06/2023   Name:  Emily Wood   DOB:  15-Nov-1950   MRN:  [id number]  PCP:  Pearline Cables, MD    Chief Complaint: No chief complaint on file.   History of Present Illness:  Emily Wood is a 72 y.o. very pleasant female patient who presents with the following:  Patient seen today for periodic follow-up Most recent visit with myself was in April  History of diabetes, chronic kidney disease, hypertension, venous insufficiency and lymphedema, obesity, Graves' hyperthyroidism in remission    She follows up with nephrology, Dr. Margot Ables Cardiologist is Dr. Tedd Sias with the [facility] clinic in [place]-she was seen in June: Assessment 1. Graves' disease * Controlled. Continue methimazole 2.5 mg daily.  2. Graves Eye Disease  * She has minimal thyroid eye disease (TED) and likely does not need medical therapy.  3. Diabetes mellitus with stage 3a CKD and microalbuminuria / diabetic nephropathy * Diabetes is controlled and Hb A1c remains at target.  * Continue Ozempic only for diabetes.  * Will not restart metformin at this time.  * Counseled her to apply annually for patient assistance from [employer].  * Encouraged her to self monitor her blood sugars. Asked her to check sugars daily and bring her glucometer. * She declines a CGM. * Continue statin.  * Up to date on annual dilated eye exams, last was in 02/2022.  4. Hypertension  * Her hypertension is uncontrolled. She will follow up with primary care provider soon about hypertension.   Cardiology-most recent visit in January with Dr.Tobb  Aspirin 81 Amlodipine Lipitor Carvedilol Clonidine Gabapentin HCTZ 25 Irbersartan Imdur Ozempic Torsemide as needed/potassium  Flu vaccine Eye exam A1c-per endocrinology COVID booster Shingrix  Patient Active Problem List    Diagnosis Date Noted   Normocytic anemia 05/23/2022   AKI (acute kidney injury) (HCC) 05/21/2022   Snoring 11/05/2021   Daytime somnolence 11/05/2021   Coronary artery disease involving native coronary artery of native heart without angina pectoris 05/15/2021   Nonrheumatic tricuspid valve regurgitation 05/15/2021   Pulmonary hypertension, unspecified (HCC) 05/15/2021   Mixed hyperlipidemia 02/20/2021   Obesity (BMI 30-39.9) 02/20/2021   Diabetic retinopathy (HCC) 08/28/2020   Mixed incontinence 05/31/2020   Graves disease 04/14/2020   Right renal mass 11/22/2019   Arthritis of left knee 09/25/2019   Lymphedema 01/25/2017   Fibroid uterus 01/14/2017   Chronic kidney disease, stage 3 (HCC) 09/21/2016   Meralgia paresthetica of left side 08/27/2015   Diabetes mellitus type 2, controlled (HCC) 08/02/2015   Chronic venous insufficiency 06/28/2015   Essential hypertension 02/15/2015   Peripheral edema 02/15/2015   Chronic knee pain 02/15/2015    Past Medical History:  Diagnosis Date   Arthritis    Diabetes mellitus without complication (HCC)    Family history of adverse reaction to anesthesia    PONV   Graves disease 04/14/2020   Hypertension    Lymphedema    Mixed hyperlipidemia 02/20/2021    Past Surgical History:  Procedure Laterality Date   CATARACT EXTRACTION W/PHACO Right 04/14/2022   Procedure: CATARACT EXTRACTION PHACO AND INTRAOCULAR LENS PLACEMENT (IOC) RIGHT DIABETIC 10.93 00:53.0;  Surgeon: Galen Manila, MD;  Location: [facility];  Service: Ophthalmology;  Laterality: Right;   CATARACT EXTRACTION W/PHACO Left 04/28/2022   Procedure: CATARACT EXTRACTION PHACO AND INTRAOCULAR LENS PLACEMENT (IOC) LEFT DIABETIC 6.05  00:45.7;  Surgeon: Galen Manila, MD;  Location: [facility];  Service: Ophthalmology;  Laterality: Left;  Diabetic   CHOLECYSTECTOMY     HERNIA REPAIR     OTHER SURGICAL HISTORY     scar tissue removal from bowels   PARTIAL  HYSTERECTOMY     Still has ovaries   REVERSE SHOULDER ARTHROPLASTY Right 09/11/2022   Procedure: REVERSE SHOULDER ARTHROPLASTY;  Surgeon: Yolonda Kida, MD;  Location: [facility];  Service: Orthopedics;  Laterality: Right;  120   UTERINE FIBROID SURGERY      Social History   Tobacco Use   Smoking status: Never   Smokeless tobacco: Never  Vaping Use   Vaping status: Never Used  Substance Use Topics   Alcohol use: No    Alcohol/week: 0.0 standard drinks of alcohol    Comment: rarely   Drug use: No    Family History  Problem Relation Age of Onset   Hyperlipidemia Mother    Cancer Sister    Hyperlipidemia Maternal Aunt    Diabetes Maternal Aunt    Breast cancer Cousin    Diabetes Maternal Uncle     No Known Allergies  Medication list has been reviewed and updated.  Current Outpatient Medications on File Prior to Visit  Medication Sig Dispense Refill   Accu-Chek Softclix Lancets lancets Check blood sugars once daily 100 each 12   Alpha-Lipoic Acid 300 MG CAPS Take 600 mg by mouth daily.     amLODipine (NORVASC) 5 MG tablet Take 1.5 tablets (7.5 mg total) by mouth daily. 135 tablet 0   Ascorbic Acid (VITAMIN C) 1000 MG tablet Take 1,000 mg by mouth daily.     aspirin 81 MG tablet Take 81 mg by mouth daily.     atorvastatin (LIPITOR) 40 MG tablet TAKE 1 TABLET BY MOUTH DAILY 100 tablet 2   Biotin w/ Vitamins C & E (HAIR/SKIN/NAILS PO) Take 6,000 mcg by mouth daily.     carvedilol (COREG) 12.5 MG tablet TAKE 1 TABLET BY MOUTH TWICE  DAILY 200 tablet 2   Cholecalciferol (VITAMIN D3) 125 MCG (5000 UT) CAPS Take 1 capsule (5,000 Units total) by mouth daily.     cloNIDine (CATAPRES) 0.2 MG tablet Take 1 tablet (0.2 mg total) by mouth 3 (three) times daily. 270 tablet 0   Cobalamin Combinations (B-12) [phone] MCG SUBL Place 5,000 mcg under the tongue daily.     diclofenac Sodium (VOLTAREN) 1 % GEL Apply 4 g topically 4 (four) times daily. (Patient taking differently: Apply 4 g  topically daily as needed.) 100 g 0   gabapentin (NEURONTIN) 400 MG capsule Take 2 capsules (800 mg total) by mouth 3 (three) times daily. 540 capsule 0   GEMTESA 75 MG TABS Take 75 mg by mouth daily. (Patient not taking: Reported on 03/29/2023)     glucose blood (ACCU-CHEK GUIDE) test strip Check blood sugars once daily 100 strip 12   hydrochlorothiazide (HYDRODIURIL) 25 MG tablet TAKE 1 TABLET BY MOUTH DAILY (Patient not taking: Reported on 04/12/2023) 80 tablet 3   irbesartan (AVAPRO) 300 MG tablet TAKE 1 TABLET BY MOUTH DAILY 100 tablet 2   isosorbide mononitrate (IMDUR) 30 MG 24 hr tablet TAKE 1 TABLET BY MOUTH DAILY 100 tablet 0   methimazole (TAPAZOLE) 5 MG tablet Take 2.5 mg by mouth daily.     Multiple Vitamin (MULTIVITAMIN ADULT PO) Take 2 tablets by mouth daily.     nystatin cream (MYCOSTATIN) Apply 1 application topically 2 (two)  times daily. (Patient taking differently: Apply 1 application  topically 2 (two) times daily as needed for dry skin.) 30 g 1   oxybutynin (DITROPAN-XL) 10 MG 24 hr tablet Take 10 mg by mouth at bedtime.     OZEMPIC, 0.25 OR 0.5 MG/DOSE, 2 MG/3ML SOPN Inject 0.5 mg into the skin once a week.     potassium chloride SA (KLOR-CON M) 20 MEQ tablet Take 1 tablet (20 mEq total) by mouth daily. 90 tablet 0   prednisoLONE acetate (PRED FORTE) 1 % ophthalmic suspension Place 1 drop into both eyes 4 (four) times daily. 5 mL 0   torsemide (DEMADEX) 10 MG tablet TAKE 1 TABLET (10 MG TOTAL) BY MOUTH DAILY. USE AS NEEDED FOR LOWER EXTREMITY SWELLING 90 tablet 1   Turmeric 500 MG CAPS Take 500 mg by mouth daily.     No current facility-administered medications on file prior to visit.    Review of Systems:  As per HPI- otherwise negative.   Physical Examination: There were no vitals filed for this visit. There were no vitals filed for this visit. There is no height or weight on file to calculate BMI. Ideal Body Weight:    GEN: no acute distress. HEENT: Atraumatic,  Normocephalic.  Ears and Nose: No external deformity. CV: RRR, No M/G/R. No JVD. No thrill. No extra heart sounds. PULM: CTA B, no wheezes, crackles, rhonchi. No retractions. No resp. distress. No accessory muscle use. ABD: S, NT, ND, +BS. No rebound. No HSM. EXTR: No c/c/e PSYCH: Normally interactive. Conversant.    Assessment and Plan: ***  Signed Abbe Amsterdam, MD

## 2023-05-03 NOTE — Progress Notes (Signed)
Care Coordination Note  05/03/2023 Name: Emily Wood MRN: [id number] DOB: [birth year]  Emily Wood is a 72 y.o. year old female who is a primary care patient of Copland, Gwenlyn Found, MD and is actively engaged with the care management team. I reached out to [name] by phone today to assist with re-scheduling a follow up visit with the RN Case Manager and BSW  Follow up plan: Telephone appointment with care management team member scheduled for: 05/18/2023  Burman Nieves, [facility] Care Coordination Care Guide Direct Dial: [phone]

## 2023-05-05 ENCOUNTER — Ambulatory Visit: Payer: Medicare Other | Attending: Nurse Practitioner | Admitting: Occupational Therapy

## 2023-05-05 ENCOUNTER — Encounter: Payer: Self-pay | Admitting: Occupational Therapy

## 2023-05-05 DIAGNOSIS — I890 Lymphedema, not elsewhere classified: Secondary | ICD-10-CM | POA: Insufficient documentation

## 2023-05-05 NOTE — Therapy (Signed)
+- OUTPATIENT OCCUPATIONAL THERAPY TREATMENT NOTE  LOWER EXTREMITY LYMPHEDEMA Patient Name: Emily Wood MRN: [id number] DOB:Feb 09, 1951, 72 y.o., female Today's Date: 05/05/2023  END OF SESSION:   OT End of Session - 05/05/23 0811     Visit Number 43    Number of Visits 72    Date for OT Re-Evaluation 08/03/23    OT Start Time 0805    OT Stop Time 0900    OT Time Calculation (min) 55 min    Activity Tolerance Patient tolerated treatment well;No increased pain    Behavior During Therapy [facility] for tasks assessed/performed               Past Medical History:  Diagnosis Date   Arthritis    Diabetes mellitus without complication (HCC)    Family history of adverse reaction to anesthesia    PONV   Graves disease 04/14/2020   Hypertension    Lymphedema    Mixed hyperlipidemia 02/20/2021   Past Surgical History:  Procedure Laterality Date   CATARACT EXTRACTION W/PHACO Right 04/14/2022   Procedure: CATARACT EXTRACTION PHACO AND INTRAOCULAR LENS PLACEMENT (IOC) RIGHT DIABETIC 10.93 00:53.0;  Surgeon: Galen Manila, MD;  Location: [facility];  Service: Ophthalmology;  Laterality: Right;   CATARACT EXTRACTION W/PHACO Left 04/28/2022   Procedure: CATARACT EXTRACTION PHACO AND INTRAOCULAR LENS PLACEMENT (IOC) LEFT DIABETIC 6.05 00:45.7;  Surgeon: Galen Manila, MD;  Location: [facility];  Service: Ophthalmology;  Laterality: Left;  Diabetic   CHOLECYSTECTOMY     HERNIA REPAIR     OTHER SURGICAL HISTORY     scar tissue removal from bowels   PARTIAL HYSTERECTOMY     Still has ovaries   REVERSE SHOULDER ARTHROPLASTY Right 09/11/2022   Procedure: REVERSE SHOULDER ARTHROPLASTY;  Surgeon: Yolonda Kida, MD;  Location: [facility];  Service: Orthopedics;  Laterality: Right;  120   UTERINE FIBROID SURGERY     Patient Active Problem List   Diagnosis Date Noted   Normocytic anemia 05/23/2022   AKI (acute kidney injury) (HCC) 05/21/2022   Snoring  11/05/2021   Daytime somnolence 11/05/2021   Coronary artery disease involving native coronary artery of native heart without angina pectoris 05/15/2021   Nonrheumatic tricuspid valve regurgitation 05/15/2021   Pulmonary hypertension, unspecified (HCC) 05/15/2021   Mixed hyperlipidemia 02/20/2021   Obesity (BMI 30-39.9) 02/20/2021   Diabetic retinopathy (HCC) 08/28/2020   Mixed incontinence 05/31/2020   Graves disease 04/14/2020   Right renal mass 11/22/2019   Arthritis of left knee 09/25/2019   Lymphedema 01/25/2017   Fibroid uterus 01/14/2017   Chronic kidney disease, stage 3 (HCC) 09/21/2016   Meralgia paresthetica of left side 08/27/2015   Diabetes mellitus type 2, controlled (HCC) 08/02/2015   Chronic venous insufficiency 06/28/2015   Essential hypertension 02/15/2015   Peripheral edema 02/15/2015   Chronic knee pain 02/15/2015    PCP: Pearline Cables, MD  REFERRING PROVIDER: Sheppard Plumber, NP  REFERRING DIAG: I89.0  THERAPY DIAG:  Lymphedema, not elsewhere classified  Rationale for Evaluation and Treatment: Rehabilitation  ONSET DATE: insidious onset >30 years  SUBJECTIVE:  SUBJECTIVE STATEMENT: Emily Wood presents to Occupational Therapy for treatment of BLE lymphedema. She arrives with knee length, multilayer compression wraps in place on LLE and compression stocking in place on the R.  She is unaccompanied and walks to clinic. Pt denies LE-related leg pain, but endorses OA related R knee pain 8/10, despite recent gel injection. Pt was last seen on 04/19/23. She reports new tingling in feet.  PERTINENT HISTORY:  Medical Hx contributing to, or affected by chronic, progressive lymphedema: Norvasc prescription-common side effect is leg swelling; HTN, CVI, Obesity, CAD, OA knees, CKD  stage 3, Graves Disease, DM  PAIN:  Are you having pain? Yes: R arthritis knee pain;  8/10 Pain description: stiff, aching, throbbing, tight, heavy Aggravating factors: standing , walking,  Relieving factors: "The medication from my cardiologist", elevation, compression stockings ( Pt has good hip AROM bilaterally and is able to don them herself w extra time in clinic.)  PRECAUTIONS: Fall and Other: LYMPHEDEMA PRECAUTIONS: (CKD, CAD and DM, GRAVES )   WEIGHT BEARING RESTRICTIONS: No  FALLS: Has patient fallen since last visit? NO    HAND DOMINANCE: right   PRIOR LEVEL OF FUNCTION: Requires assistive device for independence, Needs assistance with homemaking, and Needs assistance with gait  PATIENT GOALS: ".... to get this lymphedema under control. It's embarrassing. That's why I wear long skirts."   OBJECTIVE:   Mild, Stage  II, Bilateral Lower Extremity Lymphedema 2/2 CVI and Obesity  OBSERVATIONS / OTHER ASSESSMENTS:   Lymphedema Life Impact Scape (LLIS): Intake 11/03/22: 50% (The extent to which L:E-related problems affected your life during the past week)  FOTO functional outcome scale: Intake 11/16/22: 63%  BLE COMPARATIVE LIMB VOLUMETRICS:   Intake measurements completed 10/22/22  LANDMARK RIGHT   R LEG (A-D) 5043.4 ml  R THIGH (E-G) 4182.3 ml  R FULL LIMB (A-G) 7643.8 ml  Limb Volume differential (LVD)   Leg LVD =12.14%, L>R Thigh LVD 8.3%, L>R Full limb LVD= 8.3%   Volume change since initial %  Volume change overall V  (Blank rows = not tested)  LANDMARK LEFT    R LEG (A-D) 5656.0 ml  R THIGH (E-G) 4529.5 ml  R FULL LIMB (A-G) 10185.5 ml  Limb Volume differential (LVD)  %  Volume change since initial %  Volume change overall %  (Blank rows = not tested)   9th Visit: 12/07/22  [facility] RIGHT   R LEG (A-D) 5116.3 ml  R THIGH (E-G) 5047.6  ml  R FULL LIMB (A-G) 10163.9  ml  Limb Volume differential (LVD)    Volume change since initial R LEG volume  INCREASED by 1.4%;  R THIGH INCREASED by 20.7%, and R FULL LIMB INCREASED by 10.17% since initial on 10/22/22.  Volume change overall V  (Blank rows = not tested)       19 th visit 01/18/23  LANDMARK RIGHT   R LEG (A-D) 4606.5 ml  R THIGH (E-G) 4617.0 ml  R FULL LIMB (A-G) 9224 ml  Limb Volume differential (LVD)    Volume change since initial Since last measured on 9th Rx visit on 12/07/22, R LEG volume is DECREASED by 9.96 %, R THIGH volume is DECREASED by 8.5 %, and R FULL LIMB volume is DECREASED by 9.25 %   Volume change overall Since initially measured on 10/22/22 on 1st Rx visit,  R LEG volume is DECREASED by 8.67 % overall, R THIGH volume is INCREASED by 10 %, and R FULL LIMB volume is DECREASED  by 0.018 %    29 th Visit 03/02/23   [facility] RIGHT   R LEG (A-D) 5749.7 ml  R THIGH (E-G)  ml  R FULL LIMB (A-G)  ml  Limb Volume differential (LVD)    Volume change since last Since 01/18/23 R leg  is INCREASED by 15.15%.  Volume change overall   (Blank rows = not tested)  LANDMARK LEFT    L LEG (A-D) Since initial 11/11/22 L LEG is INCREASED by 1.66%.   L THIGH (E-G)   L FULL LIMB (A-G)   Limb Volume differential (LVD)    Volume change since initial +1.66%  Volume change overall    39 th Visit 04/07/23  LANDMARK LEFT  L  LEG (A-D) 5297.8  ml  L THIGH (E-G)  ml  L FULL LIMB (A-G)  ml  Limb Volume differential (LVD)    Volume change since last -7.9%  Volume change overall     TODAY'S TREATMENT:  LLE multilayer gradient compression wraps-knee high Pt edu for LE self-care and reviewed progress towards goals to date                  PATIENT EDUCATION:  Pt edu for LE self care:  Continued Pt/ CG edu for lymphedema self care home program throughout session. Emphasis today on lymphedema precautions and cellulitis ID, prevention and Rx. Discussed again fluid dynamics re elevation, and importance of elevation for lymphedema management and venous return. Other topics include outcome of  comparative limb volumetrics- starting limb volume differentials (LVDs), technology and gradient techniques used for short stretch, multilayer compression wrapping, simple self-MLD, therapeutic lymphatic pumping exercises, skin/nail care, LE precautions,. compression garment recommendations and specifications, wear and care schedule and compression garment donning / doffing w assistive devices. Discussed progress towards all OT goals since commencing CDT. All questions answered to the Pt's satisfaction. Good return. Person educated: Patient Education method: Explanation, Demonstration, Tactile cues, Verbal cues, and Handouts Education comprehension: verbalized understanding, returned demonstration, and needs further education  HOME EXERCISE PROGRAM: Intensive Phase Complete Decongestive Therapy (CDT) includes Daily Manual lymphatic drainage: Short neck sequence bilaterally w diaphragmatic breathing to engage deep abdominal lymphatics. Stationary J strokes to inguinal LN, then proximal to distal dynamic J strokes to thigh, "bottleneck" region, leg and foot. Finally 3 retrograde sweeps back to terminus. Daily Skin care w/ low ph lotion matching skin ph to limit infection risk and improve tissue flexibility and excursion Daily, BLE lymphatic pumping therex- seated or supine Daily (23/7) multilayer compression bandages below the knee to reduce limb volume, one limb at a time (One each 8 cm, 10 cm and 12 cm wide x 5 meters long short stretch compression wrap applied circumferentially using gradient layering techniques over single layer of cotton stockinett an Rosidal foam- toes to popliteal fossa  Self-management Phase CDT includes: Appropriate daytime compression garments and HOS devices full time daily. Multiples needed  for hygiene. Replace q 3-6 months Elvarex, flat knit, ccl 225-32 mmHg) knee highs w open toe, t heel, oblique top edge, 2.5 cm wide SB on top, tricot patch at anterior ankle sewn on all 4  sides. To be work full time waking hours. Jobst RELAX knee high HOS device  Custom-made gradient compression garments and HOS devices are medically necessary in this case because they are uniquely sized and shaped to fit the exact dimensions of the affected extremities with deformities, and to provide accurate and consistent gradient compression and containment, essential to optimally managing this patient's  symptoms of chronic, progressive lymphedema. Multiple custom compression garments are needed for optimal hygiene to limit infection risk. Custom compression garments should be replaced q 3-6 months When worn consistently for optimal lipo-lymphedema self-management over time.   ASSESSMENT:  CLINICAL IMPRESSION: Continued MLD to LLE/LLQ with simultaneous skin care w emphasis on decongestion above te knee. Applied L LEG compression wraps below the knee as established.  Pt managing well with reduced frequency while awaiting custom compression garment delivery from DME provider. Cont as per POC.   OBJECTIVE IMPAIRMENTS: low vision, Abnormal gait, decreased activity tolerance, decreased balance, decreased endurance, decreased knowledge of condition, decreased knowledge of use of DME, decreased mobility, decreased ROM, increased edema, impaired flexibility, postural dysfunction, obesity, and pain.   ACTIVITY LIMITATIONS: functional mobility and ambulation (carrying, lifting, bending, sitting, standing, squatting, sleeping, stairs, transfers, bed mobility, continence, Basic and instrumental ADLs (bathing, dressing, hygiene/grooming, Productive activities ( caring for others) and leisure pursuits, body image, and social participation  PERSONAL FACTORS:  multiple co morbidities, length of time with progressive condition, are also affecting patient's functional outcome.   REHAB POTENTIAL: Fair    EVALUATION COMPLEXITY: Moderate   GOALS: Goals reviewed with patient? Yes  SHORT TERM GOALS: Target  date: 4th OT Rx visit   Pt will demonstrate understanding of lymphedema precautions and prevention strategies with modified independence using a printed reference to identify at least 5 precautions and discussing how s/he may implement them into daily life to reduce risk of progression with extra time (Modified Independence). Baseline:Max A Goal status: 12/07/22 GOAL MET  2.  Pt will be able to apply multilayer, thigh length, gradient, compression wraps to one leg at a time with modified assistance (extra time) to decrease limb volume, to limit infection risk, and to limit lymphedema progression.  Baseline: Dependent Goal status: 11/16/22 PROGRESSING 11/26/22 GOAL MET  LONG TERM GOALS: Target date: 02/01/23  Given this patient's Intake score of 63/100% on the functional outcomes FOTO tool, patient will experience an increase in function of 3 points to improve basic and instrumental ADLs performance, including lymphedema self-care.  Baseline: ONGOING   Goal status:12/07/22 GOAL ONGOING,  01/20/23 PROGRESSING 03/03/23 PROGRESSING; 04/12/23 PROGRESSING  2.  Given this patient's Intake score of 50.0 % on the Lymphedema Life Impact Scale (LLIS), patient will experience a reduction of at least 5 points in her perceived level of functional impairment resulting from lymphedema to improve functional performance and quality of life (QOL). Baseline: 50% Goal status: 12/07/22 GOAL ONGOING; 01/20/23 PROGRESSING,  03/03/23 PROGRESSING 04/12/23 PROGRESSING  3.  During Intensive phase CDT Pt will achieve at least 85% compliance with all lymphedema self-care home program components, including daily skin care, compression wraps and /or garments, simple self MLD and lymphatic pumping therex, and elevation when sitting to habituate LE self care protocol  into ADLs for optimal LE self-management over time. Baseline: Dependent Goal status: 12/08/22 ONGOING. Pt not changing wraps daily as directed, but will work on increasing  elevation anytime she is seated for next progress report. Pt will also increase frequency of re-wrapping her leg to ensure optimal volume reduction. GOAL MET 01/20/23  4.  Pt will achieve at least a 10% volume reduction below the knees to return limb to typical size and shape, to limit infection risk and LE progression, to decrease pain, to improve function. Baseline: dependent Goal status:  12/07/22 PROGRESSING 01/18/23 : Leg reduction= 8.67% since initial. Thigh  is Increased 10% since initial, And full LE is decreased by 0.018% since  11/11/22;   01/20/23 GOAL MET for R LEG w 9.96 % reduction  03/01/23 BLE comparative limb volumetrics reveal a  significant 15.15% increase in limb volume since last measured on 01/18/23. The LLE , the current Rx limb, is increased by 1.66% since initially measured in 4/24.   04/07/23 PROGRESSING with 7.9% reduction in the L leg since 03/01/23. See volumetric chart for additional details.  5.  Pt will obtain proper compression garments/compression braces and be modified independent with donning/doffing to optimize/stabilize with fluid reduction. Baseline:  Goal status: 12/07/22 PROGRESSING. 01/20/23 PROGRESSING.02/08/23 GOAL MET for RLE, 04/12/23 PROGRESSING for LLE  PLAN:  PT FREQUENCY: 2x/week  PT DURATION: 12 weeks  PLANNED INTERVENTIONS: Therapeutic exercises, Therapeutic activity, Patient/Family education, Self Care, Manual lymph drainage, Compression bandaging, scar mobilization, Manual therapy, and skin care throughout MLD  PLAN FOR NEXT SESSION:  MLD to LLE as established with simultaneous skin care LLE multilayer compression wraps as established Cont Pt edu for LE self care and progress  to date Take final FOTO and LLIS surveys   Loel Dubonnet, MS, OTR/L, CLT-LANA 05/05/23 9:02 AM`

## 2023-05-06 ENCOUNTER — Encounter: Payer: Self-pay | Admitting: Family Medicine

## 2023-05-06 ENCOUNTER — Ambulatory Visit (INDEPENDENT_AMBULATORY_CARE_PROVIDER_SITE_OTHER): Payer: Medicare Other | Admitting: Family Medicine

## 2023-05-06 VITALS — BP 150/80 | HR 65 | Temp 97.6°F | Resp 18 | Ht 65.0 in | Wt 215.4 lb

## 2023-05-06 DIAGNOSIS — D649 Anemia, unspecified: Secondary | ICD-10-CM | POA: Diagnosis not present

## 2023-05-06 DIAGNOSIS — E11319 Type 2 diabetes mellitus with unspecified diabetic retinopathy without macular edema: Secondary | ICD-10-CM | POA: Diagnosis not present

## 2023-05-06 DIAGNOSIS — I10 Essential (primary) hypertension: Secondary | ICD-10-CM

## 2023-05-06 DIAGNOSIS — Z23 Encounter for immunization: Secondary | ICD-10-CM | POA: Diagnosis not present

## 2023-05-06 DIAGNOSIS — Z7985 Long-term (current) use of injectable non-insulin antidiabetic drugs: Secondary | ICD-10-CM

## 2023-05-06 DIAGNOSIS — E119 Type 2 diabetes mellitus without complications: Secondary | ICD-10-CM

## 2023-05-06 DIAGNOSIS — R600 Localized edema: Secondary | ICD-10-CM

## 2023-05-06 DIAGNOSIS — N1831 Chronic kidney disease, stage 3a: Secondary | ICD-10-CM | POA: Diagnosis not present

## 2023-05-06 LAB — BASIC METABOLIC PANEL
BUN: 21 mg/dL (ref 6–23)
CO2: 27 meq/L (ref 19–32)
Calcium: 9.6 mg/dL (ref 8.4–10.5)
Chloride: 102 meq/L (ref 96–112)
Creatinine, Ser: 1.27 mg/dL — ABNORMAL HIGH (ref 0.40–1.20)
GFR: 42.61 mL/min — ABNORMAL LOW (ref >60.00)
Glucose, Bld: 95 mg/dL (ref 70–99)
Potassium: 3.7 meq/L (ref 3.5–5.1)
Sodium: 141 meq/L (ref 135–145)

## 2023-05-06 LAB — CBC
HCT: 32.5 % — ABNORMAL LOW (ref 36.0–46.0)
Hemoglobin: 10.3 g/dL — ABNORMAL LOW (ref 12.0–15.0)
MCHC: 31.8 g/dL (ref 30.0–36.0)
MCV: 86.5 fL (ref 78.0–100.0)
Platelets: 271 10*3/uL (ref 150.0–400.0)
RBC: 3.75 Mil/uL — ABNORMAL LOW (ref 3.87–5.11)
RDW: 14.9 % (ref 11.5–15.5)
WBC: 5.3 10*3/uL (ref 4.0–10.5)

## 2023-05-06 LAB — HEMOGLOBIN A1C: Hgb A1c MFr Bld: 6.4 % (ref 4.6–6.5)

## 2023-05-06 MED ORDER — AMLODIPINE BESYLATE 5 MG PO TABS: 5.0000 mg | ORAL_TABLET | Freq: Every day | ORAL | 2 refills | Status: DC

## 2023-05-10 ENCOUNTER — Encounter: Payer: Medicare Other | Admitting: Occupational Therapy

## 2023-05-11 DIAGNOSIS — H35352 Cystoid macular degeneration, left eye: Secondary | ICD-10-CM | POA: Diagnosis not present

## 2023-05-11 DIAGNOSIS — H30002 Unspecified focal chorioretinal inflammation, left eye: Secondary | ICD-10-CM | POA: Diagnosis not present

## 2023-05-11 DIAGNOSIS — H2000 Unspecified acute and subacute iridocyclitis: Secondary | ICD-10-CM | POA: Diagnosis not present

## 2023-05-11 LAB — HM DIABETES EYE EXAM

## 2023-05-12 ENCOUNTER — Ambulatory Visit: Payer: Medicare Other | Admitting: Occupational Therapy

## 2023-05-12 DIAGNOSIS — I890 Lymphedema, not elsewhere classified: Secondary | ICD-10-CM

## 2023-05-12 NOTE — Therapy (Signed)
+- OUTPATIENT OCCUPATIONAL THERAPY TREATMENT NOTE  LOWER EXTREMITY LYMPHEDEMA Patient Name: Emily Wood MRN: [id number] DOB:1950/10/05, 72 y.o., female Today's Date: 05/12/2023  END OF SESSION:   OT End of Session - 05/12/23 0814     Visit Number 44    Number of Visits 72    Date for OT Re-Evaluation 08/03/23    OT Start Time 0807    OT Stop Time 0900    OT Time Calculation (min) 53 min    Activity Tolerance Patient tolerated treatment well;No increased pain    Behavior During Therapy [facility] for tasks assessed/performed               Past Medical History:  Diagnosis Date   Arthritis    Diabetes mellitus without complication (HCC)    Family history of adverse reaction to anesthesia    PONV   Graves disease 04/14/2020   Hypertension    Lymphedema    Mixed hyperlipidemia 02/20/2021   Past Surgical History:  Procedure Laterality Date   CATARACT EXTRACTION W/PHACO Right 04/14/2022   Procedure: CATARACT EXTRACTION PHACO AND INTRAOCULAR LENS PLACEMENT (IOC) RIGHT DIABETIC 10.93 00:53.0;  Surgeon: Galen Manila, MD;  Location: [facility];  Service: Ophthalmology;  Laterality: Right;   CATARACT EXTRACTION W/PHACO Left 04/28/2022   Procedure: CATARACT EXTRACTION PHACO AND INTRAOCULAR LENS PLACEMENT (IOC) LEFT DIABETIC 6.05 00:45.7;  Surgeon: Galen Manila, MD;  Location: [facility];  Service: Ophthalmology;  Laterality: Left;  Diabetic   CHOLECYSTECTOMY     HERNIA REPAIR     OTHER SURGICAL HISTORY     scar tissue removal from bowels   PARTIAL HYSTERECTOMY     Still has ovaries   REVERSE SHOULDER ARTHROPLASTY Right 09/11/2022   Procedure: REVERSE SHOULDER ARTHROPLASTY;  Surgeon: Yolonda Kida, MD;  Location: [facility];  Service: Orthopedics;  Laterality: Right;  120   UTERINE FIBROID SURGERY     Patient Active Problem List   Diagnosis Date Noted   Normocytic anemia 05/23/2022   AKI (acute kidney injury) (HCC) 05/21/2022   Snoring  11/05/2021   Daytime somnolence 11/05/2021   Coronary artery disease involving native coronary artery of native heart without angina pectoris 05/15/2021   Nonrheumatic tricuspid valve regurgitation 05/15/2021   Pulmonary hypertension, unspecified (HCC) 05/15/2021   Mixed hyperlipidemia 02/20/2021   Obesity (BMI 30-39.9) 02/20/2021   Diabetic retinopathy (HCC) 08/28/2020   Mixed incontinence 05/31/2020   Graves disease 04/14/2020   Right renal mass 11/22/2019   Arthritis of left knee 09/25/2019   Lymphedema 01/25/2017   Fibroid uterus 01/14/2017   Chronic kidney disease, stage 3 (HCC) 09/21/2016   Meralgia paresthetica of left side 08/27/2015   Diabetes mellitus type 2, controlled (HCC) 08/02/2015   Chronic venous insufficiency 06/28/2015   Essential hypertension 02/15/2015   Peripheral edema 02/15/2015   Chronic knee pain 02/15/2015    PCP: Pearline Cables, MD  REFERRING PROVIDER: Sheppard Plumber, NP  REFERRING DIAG: I89.0  THERAPY DIAG:  Lymphedema, not elsewhere classified  Rationale for Evaluation and Treatment: Rehabilitation  ONSET DATE: insidious onset >30 years  SUBJECTIVE:  SUBJECTIVE STATEMENT: Emily Wood presents to Occupational Therapy for treatment of BLE lymphedema. She arrives with knee length, multilayer compression wraps in place on LLE and compression stocking in place on the R.  She is unaccompanied and walks to clinic. Pt denies LE-related leg pain, but endorses OA related R knee pain 8/10, despite recent gel injection. Pt reports DME vendor called to notify her that he garment is available for pick up or delivery. Pt will pick up and bring to next OT session.  PERTINENT HISTORY:  Medical Hx contributing to, or affected by chronic, progressive lymphedema: Norvasc  prescription-common side effect is leg swelling; HTN, CVI, Obesity, CAD, OA knees, CKD stage 3, Graves Disease, DM  PAIN:  Are you having pain? Yes: R arthritis knee pain;  8/10 Pain description: stiff, aching, throbbing, tight, heavy Aggravating factors: standing , walking,  Relieving factors: "The medication from my cardiologist", elevation, compression stockings ( Pt has good hip AROM bilaterally and is able to don them herself w extra time in clinic.)  PRECAUTIONS: Fall and Other: LYMPHEDEMA PRECAUTIONS: (CKD, CAD and DM, GRAVES )   WEIGHT BEARING RESTRICTIONS: No  FALLS: Has patient fallen since last visit? NO    HAND DOMINANCE: right   PRIOR LEVEL OF FUNCTION: Requires assistive device for independence, Needs assistance with homemaking, and Needs assistance with gait  PATIENT GOALS: ".... to get this lymphedema under control. It's embarrassing. That's why I wear long skirts."   OBJECTIVE:   Mild, Stage  II, Bilateral Lower Extremity Lymphedema 2/2 CVI and Obesity  OBSERVATIONS / OTHER ASSESSMENTS:   Lymphedema Life Impact Scape (LLIS): Intake 11/03/22: 50% (The extent to which L:E-related problems affected your life during the past week)  FOTO functional outcome scale: Intake 11/16/22: 63%  BLE COMPARATIVE LIMB VOLUMETRICS:   Intake measurements completed 10/22/22  LANDMARK RIGHT   R LEG (A-D) 5043.4 ml  R THIGH (E-G) 4182.3 ml  R FULL LIMB (A-G) 7643.8 ml  Limb Volume differential (LVD)   Leg LVD =12.14%, L>R Thigh LVD 8.3%, L>R Full limb LVD= 8.3%   Volume change since initial %  Volume change overall V  (Blank rows = not tested)  LANDMARK LEFT    R LEG (A-D) 5656.0 ml  R THIGH (E-G) 4529.5 ml  R FULL LIMB (A-G) 10185.5 ml  Limb Volume differential (LVD)  %  Volume change since initial %  Volume change overall %  (Blank rows = not tested)   9th Visit: 12/07/22  [facility] RIGHT   R LEG (A-D) 5116.3 ml  R THIGH (E-G) 5047.6  ml  R FULL LIMB (A-G) 10163.9   ml  Limb Volume differential (LVD)    Volume change since initial R LEG volume INCREASED by 1.4%;  R THIGH INCREASED by 20.7%, and R FULL LIMB INCREASED by 10.17% since initial on 10/22/22.  Volume change overall V  (Blank rows = not tested)       19 th visit 01/18/23  LANDMARK RIGHT   R LEG (A-D) 4606.5 ml  R THIGH (E-G) 4617.0 ml  R FULL LIMB (A-G) 9224 ml  Limb Volume differential (LVD)    Volume change since initial Since last measured on 9th Rx visit on 12/07/22, R LEG volume is DECREASED by 9.96 %, R THIGH volume is DECREASED by 8.5 %, and R FULL LIMB volume is DECREASED by 9.25 %   Volume change overall Since initially measured on 10/22/22 on 1st Rx visit,  R LEG volume is DECREASED by 8.67 %  overall, R THIGH volume is INCREASED by 10 %, and R FULL LIMB volume is DECREASED by 0.018 %    29 th Visit 03/02/23   LANDMARK RIGHT   R LEG (A-D) 5749.7 ml  R THIGH (E-G)  ml  R FULL LIMB (A-G)  ml  Limb Volume differential (LVD)    Volume change since last Since 01/18/23 R leg  is INCREASED by 15.15%.  Volume change overall   (Blank rows = not tested)  LANDMARK LEFT    L LEG (A-D) Since initial 11/11/22 L LEG is INCREASED by 1.66%.   L THIGH (E-G)   L FULL LIMB (A-G)   Limb Volume differential (LVD)    Volume change since initial +1.66%  Volume change overall    39 th Visit 04/07/23  LANDMARK LEFT  L  LEG (A-D) 5297.8  ml  L THIGH (E-G)  ml  L FULL LIMB (A-G)  ml  Limb Volume differential (LVD)    Volume change since last -7.9%  Volume change overall     TODAY'S TREATMENT:  LLE multilayer gradient compression wraps-knee high Pt edu for LE self-care and reviewed progress towards goals to date                  PATIENT EDUCATION:  Pt edu for LE self care:  Continued Pt/ CG edu for lymphedema self care home program throughout session. Emphasis today on lymphedema precautions and cellulitis ID, prevention and Rx. Discussed again fluid dynamics re elevation, and importance of  elevation for lymphedema management and venous return. Other topics include outcome of comparative limb volumetrics- starting limb volume differentials (LVDs), technology and gradient techniques used for short stretch, multilayer compression wrapping, simple self-MLD, therapeutic lymphatic pumping exercises, skin/nail care, LE precautions,. compression garment recommendations and specifications, wear and care schedule and compression garment donning / doffing w assistive devices. Discussed progress towards all OT goals since commencing CDT. All questions answered to the Pt's satisfaction. Good return. Person educated: Patient Education method: Explanation, Demonstration, Tactile cues, Verbal cues, and Handouts Education comprehension: verbalized understanding, returned demonstration, and needs further education  HOME EXERCISE PROGRAM: Intensive Phase Complete Decongestive Therapy (CDT) includes Daily Manual lymphatic drainage: Short neck sequence bilaterally w diaphragmatic breathing to engage deep abdominal lymphatics. Stationary J strokes to inguinal LN, then proximal to distal dynamic J strokes to thigh, "bottleneck" region, leg and foot. Finally 3 retrograde sweeps back to terminus. Daily Skin care w/ low ph lotion matching skin ph to limit infection risk and improve tissue flexibility and excursion Daily, BLE lymphatic pumping therex- seated or supine Daily (23/7) multilayer compression bandages below the knee to reduce limb volume, one limb at a time (One each 8 cm, 10 cm and 12 cm wide x 5 meters long short stretch compression wrap applied circumferentially using gradient layering techniques over single layer of cotton stockinett an Rosidal foam- toes to popliteal fossa  Self-management Phase CDT includes: Appropriate daytime compression garments and HOS devices full time daily. Multiples needed  for hygiene. Replace q 3-6 months Elvarex, flat knit, ccl 225-32 mmHg) knee highs w open toe, t  heel, oblique top edge, 2.5 cm wide SB on top, tricot patch at anterior ankle sewn on all 4 sides. To be work full time waking hours. Jobst RELAX knee high HOS device  Custom-made gradient compression garments and HOS devices are medically necessary in this case because they are uniquely sized and shaped to fit the exact dimensions of the affected extremities with deformities,  and to provide accurate and consistent gradient compression and containment, essential to optimally managing this patient's symptoms of chronic, progressive lymphedema. Multiple custom compression garments are needed for optimal hygiene to limit infection risk. Custom compression garments should be replaced q 3-6 months When worn consistently for optimal lipo-lymphedema self-management over time.   ASSESSMENT:  CLINICAL IMPRESSION: Continued MLD to LLE/LLQ with simultaneous skin care w emphasis on decongestion above te knee. Applied L LEG compression wraps below the knee as established.  Pt managing well with reduced frequency. Pt will pick up and bring to next OT session. Cont as per POC.   OBJECTIVE IMPAIRMENTS: low vision, Abnormal gait, decreased activity tolerance, decreased balance, decreased endurance, decreased knowledge of condition, decreased knowledge of use of DME, decreased mobility, decreased ROM, increased edema, impaired flexibility, postural dysfunction, obesity, and pain.   ACTIVITY LIMITATIONS: functional mobility and ambulation (carrying, lifting, bending, sitting, standing, squatting, sleeping, stairs, transfers, bed mobility, continence, Basic and instrumental ADLs (bathing, dressing, hygiene/grooming, Productive activities ( caring for others) and leisure pursuits, body image, and social participation  PERSONAL FACTORS:  multiple co morbidities, length of time with progressive condition, are also affecting patient's functional outcome.   REHAB POTENTIAL: Fair    EVALUATION COMPLEXITY:  Moderate   GOALS: Goals reviewed with patient? Yes  SHORT TERM GOALS: Target date: 4th OT Rx visit   Pt will demonstrate understanding of lymphedema precautions and prevention strategies with modified independence using a printed reference to identify at least 5 precautions and discussing how s/he may implement them into daily life to reduce risk of progression with extra time (Modified Independence). Baseline:Max A Goal status: 12/07/22 GOAL MET  2.  Pt will be able to apply multilayer, thigh length, gradient, compression wraps to one leg at a time with modified assistance (extra time) to decrease limb volume, to limit infection risk, and to limit lymphedema progression.  Baseline: Dependent Goal status: 11/16/22 PROGRESSING 11/26/22 GOAL MET  LONG TERM GOALS: Target date: 02/01/23  Given this patient's Intake score of 63/100% on the functional outcomes FOTO tool, patient will experience an increase in function of 3 points to improve basic and instrumental ADLs performance, including lymphedema self-care.  Baseline: ONGOING   Goal status:12/07/22 GOAL ONGOING,  01/20/23 PROGRESSING 03/03/23 PROGRESSING; 04/12/23 PROGRESSING  2.  Given this patient's Intake score of 50.0 % on the Lymphedema Life Impact Scale (LLIS), patient will experience a reduction of at least 5 points in her perceived level of functional impairment resulting from lymphedema to improve functional performance and quality of life (QOL). Baseline: 50% Goal status: 12/07/22 GOAL ONGOING; 01/20/23 PROGRESSING,  03/03/23 PROGRESSING 04/12/23 PROGRESSING  3.  During Intensive phase CDT Pt will achieve at least 85% compliance with all lymphedema self-care home program components, including daily skin care, compression wraps and /or garments, simple self MLD and lymphatic pumping therex, and elevation when sitting to habituate LE self care protocol  into ADLs for optimal LE self-management over time. Baseline: Dependent Goal status: 12/08/22  ONGOING. Pt not changing wraps daily as directed, but will work on increasing elevation anytime she is seated for next progress report. Pt will also increase frequency of re-wrapping her leg to ensure optimal volume reduction. GOAL MET 01/20/23  4.  Pt will achieve at least a 10% volume reduction below the knees to return limb to typical size and shape, to limit infection risk and LE progression, to decrease pain, to improve function. Baseline: dependent Goal status:  12/07/22 PROGRESSING 01/18/23 : Leg reduction= 8.67%  since initial. Thigh  is Increased 10% since initial, And full LE is decreased by 0.018% since 11/11/22;   01/20/23 GOAL MET for R LEG w 9.96 % reduction  03/01/23 BLE comparative limb volumetrics reveal a  significant 15.15% increase in limb volume since last measured on 01/18/23. The LLE , the current Rx limb, is increased by 1.66% since initially measured in 4/24.   04/07/23 PROGRESSING with 7.9% reduction in the L leg since 03/01/23. See volumetric chart for additional details.  5.  Pt will obtain proper compression garments/compression braces and be modified independent with donning/doffing to optimize/stabilize with fluid reduction. Baseline:  Goal status: 12/07/22 PROGRESSING. 01/20/23 PROGRESSING.02/08/23 GOAL MET for RLE, 04/12/23 PROGRESSING for LLE  PLAN:  PT FREQUENCY: 1 x weekly  PT DURATION: 12 weeks  PLANNED INTERVENTIONS: Therapeutic exercises, Therapeutic activity, Patient/Family education, Self Care, Manual lymph drainage, Compression bandaging, scar mobilization, Manual therapy, and skin care throughout MLD  PLAN FOR NEXT SESSION:  MLD to LLE as established with simultaneous skin care Fit R custom compression garment  Cont Pt edu for LE self care and progress  to date Take final FOTO and LLIS surveys   Loel Dubonnet, MS, OTR/L, CLT-LANA 05/12/23 9:02 AM

## 2023-05-14 ENCOUNTER — Ambulatory Visit: Payer: Medicare Other | Attending: Cardiology | Admitting: Cardiology

## 2023-05-14 ENCOUNTER — Encounter: Payer: Self-pay | Admitting: Cardiology

## 2023-05-14 VITALS — BP 168/90 | HR 55 | Ht 65.0 in | Wt 214.0 lb

## 2023-05-14 DIAGNOSIS — I493 Ventricular premature depolarization: Secondary | ICD-10-CM | POA: Diagnosis not present

## 2023-05-14 DIAGNOSIS — I2510 Atherosclerotic heart disease of native coronary artery without angina pectoris: Secondary | ICD-10-CM

## 2023-05-14 DIAGNOSIS — R001 Bradycardia, unspecified: Secondary | ICD-10-CM | POA: Insufficient documentation

## 2023-05-14 DIAGNOSIS — E0800 Diabetes mellitus due to underlying condition with hyperosmolarity without nonketotic hyperglycemic-hyperosmolar coma (NKHHC): Secondary | ICD-10-CM | POA: Diagnosis not present

## 2023-05-14 DIAGNOSIS — E782 Mixed hyperlipidemia: Secondary | ICD-10-CM

## 2023-05-14 DIAGNOSIS — I071 Rheumatic tricuspid insufficiency: Secondary | ICD-10-CM | POA: Diagnosis not present

## 2023-05-14 DIAGNOSIS — I10 Essential (primary) hypertension: Secondary | ICD-10-CM

## 2023-05-14 MED ORDER — AMLODIPINE BESYLATE 10 MG PO TABS: 10.0000 mg | ORAL_TABLET | Freq: Every day | ORAL | 3 refills | Status: DC

## 2023-05-14 NOTE — Progress Notes (Signed)
Cardiology Office Note:    Date:  05/14/2023   ID:  Emily Wood, DOB April 08, 1951, MRN [id number]  PCP:  Emily Cables, MD  Cardiologist:  Emily Ripple, DO  Electrophysiologist:  None   Referring MD: Emily Cables, MD   "I am having some chest pain"  History of Present Illness:    Emily Wood is a 72 y.o. female with a hx of coronary artery disease seen on her coronary CTA, mild to moderate tricuspid regurgitation, pulmonary hypertension, chronic kidney disease, Graves' disease, hyperlipidemia, type 2 diabetes.   I saw the patient on February 19, 2021 at that time she was experiencing chest discomfort as well as shortness of breath.  Given her risk factors I recommended patient undergo a coronary CTA as well as an echocardiogram.   I saw the patient on May 15, 2021 at that time we discussed her testing results.  I increased her atorvastatin to 40 mg daily.  Continue aspirin 81 mg daily.    I saw the patient on November 04, 2021 at that time we talked about getting a sleep study and repeating her echocardiogram for her tricuspid regurgitation.  I also started the patient on cautious Lasix.  We reviewed all of her lipid profile she was at goal.  She was seen 07/30/2022 at that time she was doing well from CV standpoint.   She reports ongoing management of lymphedema, currently attending a lymphedema clinic and undergoing wrapping treatment until she can receive her garments.  The patient also reports complications following cataract surgery, with swelling of the retina and fluid accumulation behind the retina. She is currently receiving injections to manage these issues. The patient reports that her vision is not as clear as it should be, affecting her ability to read and preventing her from returning to work. She also reports that she is not supposed to be driving due to her vision issues, but she is still doing so during the day.  The patient's hypertension is  managed with amlodipine, carvedilol, Avapro, and clonidine, which she reports taking daily without missing doses. She was previously on hydrochlorothiazide, but this was discontinued due to low kidney function.  No other complaints at this time.   Past Medical History:  Diagnosis Date   Arthritis    Diabetes mellitus without complication (HCC)    Family history of adverse reaction to anesthesia    PONV   Graves disease 04/14/2020   Hypertension    Lymphedema    Mixed hyperlipidemia 02/20/2021    Past Surgical History:  Procedure Laterality Date   CATARACT EXTRACTION W/PHACO Right 04/14/2022   Procedure: CATARACT EXTRACTION PHACO AND INTRAOCULAR LENS PLACEMENT (IOC) RIGHT DIABETIC 10.93 00:53.0;  Surgeon: Emily Manila, MD;  Location: [facility];  Service: Ophthalmology;  Laterality: Right;   CATARACT EXTRACTION W/PHACO Left 04/28/2022   Procedure: CATARACT EXTRACTION PHACO AND INTRAOCULAR LENS PLACEMENT (IOC) LEFT DIABETIC 6.05 00:45.7;  Surgeon: Emily Manila, MD;  Location: [facility];  Service: Ophthalmology;  Laterality: Left;  Diabetic   CHOLECYSTECTOMY     HERNIA REPAIR     OTHER SURGICAL HISTORY     scar tissue removal from bowels   PARTIAL HYSTERECTOMY     Still has ovaries   REVERSE SHOULDER ARTHROPLASTY Right 09/11/2022   Procedure: REVERSE SHOULDER ARTHROPLASTY;  Surgeon: Emily Kida, MD;  Location: [facility];  Service: Orthopedics;  Laterality: Right;  120   UTERINE FIBROID SURGERY      Current Medications: Current  Meds  Medication Sig   Accu-Chek Softclix Lancets lancets Check blood sugars once daily   Alpha-Lipoic Acid 300 MG CAPS Take 600 mg by mouth daily.   amLODipine (NORVASC) 10 MG tablet Take 1 tablet (10 mg total) by mouth daily.   Ascorbic Acid (VITAMIN C) 1000 MG tablet Take 1,000 mg by mouth daily.   aspirin 81 MG tablet Take 81 mg by mouth daily.   atorvastatin (LIPITOR) 40 MG tablet TAKE 1 TABLET BY MOUTH DAILY    Biotin w/ Vitamins C & E (HAIR/SKIN/NAILS PO) Take 6,000 mcg by mouth daily.   carvedilol (COREG) 12.5 MG tablet TAKE 1 TABLET BY MOUTH TWICE  DAILY   Cholecalciferol (VITAMIN D3) 125 MCG (5000 UT) CAPS Take 1 capsule (5,000 Units total) by mouth daily.   cloNIDine (CATAPRES) 0.2 MG tablet Take 1 tablet (0.2 mg total) by mouth 3 (three) times daily.   Cobalamin Combinations (B-12) [phone] MCG SUBL Place 5,000 mcg under the tongue daily.   diclofenac Sodium (VOLTAREN) 1 % GEL Apply 4 g topically 4 (four) times daily. (Patient taking differently: Apply 4 g topically daily as needed.)   gabapentin (NEURONTIN) 400 MG capsule Take 2 capsules (800 mg total) by mouth 3 (three) times daily.   GEMTESA 75 MG TABS Take 75 mg by mouth daily.   glucose blood (ACCU-CHEK GUIDE) test strip Check blood sugars once daily   hydrochlorothiazide (HYDRODIURIL) 25 MG tablet TAKE 1 TABLET BY MOUTH DAILY   irbesartan (AVAPRO) 300 MG tablet TAKE 1 TABLET BY MOUTH DAILY   isosorbide mononitrate (IMDUR) 30 MG 24 hr tablet TAKE 1 TABLET BY MOUTH DAILY   methimazole (TAPAZOLE) 5 MG tablet Take 2.5 mg by mouth daily.   Multiple Vitamin (MULTIVITAMIN ADULT PO) Take 2 tablets by mouth daily.   nystatin cream (MYCOSTATIN) Apply 1 application topically 2 (two) times daily. (Patient taking differently: Apply 1 application  topically 2 (two) times daily as needed for dry skin.)   oxybutynin (DITROPAN-XL) 10 MG 24 hr tablet Take 10 mg by mouth at bedtime.   OZEMPIC, 0.25 OR 0.5 MG/DOSE, 2 MG/3ML SOPN Inject 0.5 mg into the skin once a week.   potassium chloride SA (KLOR-CON M) 20 MEQ tablet Take 1 tablet (20 mEq total) by mouth daily.   prednisoLONE acetate (PRED FORTE) 1 % ophthalmic suspension Place 1 drop into both eyes 4 (four) times daily.   torsemide (DEMADEX) 10 MG tablet TAKE 1 TABLET (10 MG TOTAL) BY MOUTH DAILY. USE AS NEEDED FOR LOWER EXTREMITY SWELLING   Turmeric 500 MG CAPS Take 500 mg by mouth daily.   [DISCONTINUED]  amLODipine (NORVASC) 5 MG tablet Take 1 tablet (5 mg total) by mouth daily.     Allergies:   Patient has no known allergies.   Social History   Socioeconomic History   Marital status: Married    Spouse name: Emily Wood   Number of children: 0   Years of education: 16   Highest education level: Associate degree: occupational, Scientist, product/process development, or vocational program  Occupational History   Occupation: customer service  Tobacco Use   Smoking status: Never   Smokeless tobacco: Never  Vaping Use   Vaping status: Never Used  Substance and Sexual Activity   Alcohol use: No    Alcohol/week: 0.0 standard drinks of alcohol    Comment: rarely   Drug use: No   Sexual activity: Not Currently    Birth control/protection: Post-menopausal  Other Topics Concern   Not on file  Social History Narrative   Lives with husband   Caffeine use: 16oz coffee per day   Social Determinants of Health   Financial Resource Strain: Medium Risk (05/05/2023)   Overall Financial Resource Strain (CARDIA)    Difficulty of Paying Living Expenses: Somewhat hard  Food Insecurity: No Food Insecurity (05/05/2023)   Hunger Vital Sign    Worried About Running Out of Food in the Last Year: Never true    Ran Out of Food in the Last Year: Never true  Transportation Needs: Unmet Transportation Needs (05/05/2023)   PRAPARE - Administrator, Civil Service (Medical): Yes    Lack of Transportation (Non-Medical): Yes  Physical Activity: Unknown (05/05/2023)   Exercise Vital Sign    Days of Exercise per Week: Patient declined    Minutes of Exercise per Session: Not on file  Stress: No Stress Concern Present (05/05/2023)   [employer] of Occupational Health - Occupational Stress Questionnaire    Feeling of Stress : Not at all  Social Connections: Moderately Integrated (05/05/2023)   Social Connection and Isolation Panel [NHANES]    Frequency of Communication with Friends and Family: Three times a week     Frequency of Social Gatherings with Friends and Family: Not on file    Attends Religious Services: 1 to 4 times per year    Active Member of [employer] or Organizations: No    Attends Engineer, structural: Not on file    Marital Status: Married     Family History: The patient's family history includes Breast cancer in her cousin; Cancer in her sister; Diabetes in her maternal aunt and maternal uncle; Hyperlipidemia in her maternal aunt and mother.  ROS:   Review of Systems  Constitution: Negative for decreased appetite, fever and weight gain.  HENT: Negative for congestion, ear discharge, hoarse voice and sore throat.   Eyes: Negative for discharge, redness, vision loss in right eye and visual halos.  Cardiovascular: Negative for chest pain, dyspnea on exertion, leg swelling, orthopnea and palpitations.  Respiratory: Negative for cough, hemoptysis, shortness of breath and snoring.   Endocrine: Negative for heat intolerance and polyphagia.  Hematologic/Lymphatic: Negative for bleeding problem. Does not bruise/bleed easily.  Skin: Negative for flushing, nail changes, rash and suspicious lesions.  Musculoskeletal: Negative for arthritis, joint pain, muscle cramps, myalgias, neck pain and stiffness.  Gastrointestinal: Negative for abdominal pain, bowel incontinence, diarrhea and excessive appetite.  Genitourinary: Negative for decreased libido, genital sores and incomplete emptying.  Neurological: Negative for brief paralysis, focal weakness, headaches and loss of balance.  Psychiatric/Behavioral: Negative for altered mental status, depression and suicidal ideas.  Allergic/Immunologic: Negative for HIV exposure and persistent infections.    EKGs/Labs/Other Studies Reviewed:    The following studies were reviewed today:   EKG:  The ekg ordered today demonstrates sinus bradycardia with occasional pvc  TTE 11/28/2021 IMPRESSIONS     1. Left ventricular ejection fraction, by  estimation, is 65 to 70%. The  left ventricle has normal function. The left ventricle has no regional  wall motion abnormalities. Left ventricular diastolic parameters are  consistent with Grade II diastolic  dysfunction (pseudonormalization). Elevated left atrial pressure.   2. Right ventricular systolic function is normal. The right ventricular  size is mildly enlarged. There is moderately elevated pulmonary artery  systolic pressure. The estimated right ventricular systolic pressure is  55.9 mmHg.   3. The mitral valve is normal in structure. Trivial mitral valve  regurgitation. No evidence of mitral stenosis.  4. Tricuspid valve regurgitation is mild to moderate.   5. The aortic valve was not well visualized. Aortic valve regurgitation  is not visualized. Aortic valve sclerosis is present, with no evidence of  aortic valve stenosis.   6. The inferior vena cava is normal in size with <50% respiratory  variability, suggesting right atrial pressure of 8 mmHg.   FINDINGS   Left Ventricle: Left ventricular ejection fraction, by estimation, is 65  to 70%. The left ventricle has normal function. The left ventricle has no  regional wall motion abnormalities. The left ventricular internal cavity  size was normal in size. There is   no left ventricular hypertrophy. Left ventricular diastolic parameters  are consistent with Grade II diastolic dysfunction (pseudonormalization).  Elevated left atrial pressure.   Right Ventricle: The right ventricular size is mildly enlarged. No  increase in right ventricular wall thickness. Right ventricular systolic  function is normal. There is moderately elevated pulmonary artery systolic  pressure. The tricuspid regurgitant  velocity is 3.46 m/s, and with an assumed right atrial pressure of 8 mmHg,  the estimated right ventricular systolic pressure is 55.9 mmHg.   Left Atrium: Left atrial size was normal in size.   Right Atrium: Right atrial size was  normal in size.   Pericardium: Trivial pericardial effusion is present.   Mitral Valve: The mitral valve is normal in structure. Trivial mitral  valve regurgitation. No evidence of mitral valve stenosis.   Tricuspid Valve: The tricuspid valve is normal in structure. Tricuspid  valve regurgitation is mild to moderate.   Aortic Valve: The aortic valve was not well visualized. Aortic valve  regurgitation is not visualized. Aortic valve sclerosis is present, with  no evidence of aortic valve stenosis.   Pulmonic Valve: The pulmonic valve was not well visualized. Pulmonic valve  regurgitation is not visualized.   Aorta: The aortic root and ascending aorta are structurally normal, with  no evidence of dilitation.   Venous: The inferior vena cava is normal in size with less than 50%  respiratory variability, suggesting right atrial pressure of 8 mmHg.   IAS/Shunts: The interatrial septum was not well visualized.    Medication Adjustments/Labs and Tests Ordered: Current medicines are reviewed at length with the patient today.  Concerns regarding medicines are outlined above.  Tests Ordered: Orders Placed This Encounter  Procedures   AMB Referral to [facility] Pharm-D   EKG 12-Lead   Medication Changes: Meds ordered this encounter  Medications   amLODipine (NORVASC) 10 MG tablet    Sig: Take 1 tablet (10 mg total) by mouth daily.    Dispense:  90 tablet    Refill:  3    Dose change    CCTA March 03, 2021 FINDINGS: Image quality: Poor quality study due to early trigger of the contrast bolus. The contrast bolus is largely timed for the right ventricular with significant SVC enhancement.   Noise artifact is: Moderate signal to noise artifact due to poor contrast bolus timing.   Coronary Arteries:  Normal coronary origin.  Right dominance.   Left main: The left main is a large caliber vessel with a normal take off from the left coronary cusp that bifurcates to form a  left anterior descending artery and a left circumflex artery.   Left anterior descending artery: The proximal LAD contains minimal calcified plaque (<25%). The mid and distal segments appear patent. The first diagonal branch contains minimal calcified plaque (<25%).   Left circumflex artery: The LCX is non-dominant  and patent with no evidence of plaque or stenosis. The LCX gives off 3 patent obtuse marginal branches.   Right coronary artery: The RCA is dominant with normal take off from the right coronary cusp. The ostial RCA contains mild mixed density plaque (25-49%). The proximal segment contains minimal calcified plaque (<25%). The mid segment is patent. The distal RCA contains minimal calcified plaque (<25%). The RCA terminates as a PDA and right posterolateral branch without evidence of plaque or stenosis.   Right Atrium: Right atrial size is within normal limits.   Right Ventricle: The right ventricular cavity is within normal limits.   Left Atrium: Left atrial size is normal in size with no left atrial appendage filling defect.   Left Ventricle: The ventricular cavity size is within normal limits. There are no stigmata of prior infarction. There is no abnormal filling defect.   Pulmonary arteries: Normal in size without proximal filling defect.   Pulmonary veins: Normal pulmonary venous drainage.   Pericardium: Normal thickness with no significant effusion or calcium present.   Cardiac valves: The aortic valve is trileaflet without significant calcification. The mitral valve is normal structure with minimal annular calcium.   Aorta: Normal caliber with no significant disease.   Extra-cardiac findings: See attached radiology report for non-cardiac structures.   IMPRESSION: 1. Poor quality study due to early trigger of the contrast bolus. The contrast bolus is largely timed for the right ventricular with significant SVC enhancement. Study remains  interpretable.   2. Coronary calcium score of 418. This was 93rd percentile for age-, sex, and race-matched controls.   3. Normal coronary origin with right dominance.   4. Mild mixed density plaque (25-49%) in the ostial RCA.   5. Minimal calcified plaque (<25%) in the proximal LAD.   RECOMMENDATIONS: 1. Mild non-obstructive CAD (25-49%) in the ostial RCA. CT FFR will be sent due to ostial location. Consider preventive therapy and risk factor modification.   Lennie Odor, MD   Echo August 2022 FINDINGS   Left Ventricle: Left ventricular ejection fraction, by estimation, is 60  to 65%. The left ventricle has normal function. The left ventricle has no  regional wall motion abnormalities. Definity contrast agent was given IV  to delineate the left ventricular   endocardial borders. The left ventricular internal cavity size was normal  in size. There is no left ventricular hypertrophy. Left ventricular  diastolic parameters are consistent with Grade II diastolic dysfunction  (pseudonormalization). Elevated left  atrial pressure.   Right Ventricle: The right ventricular size is normal. No increase in  right ventricular wall thickness. Right ventricular systolic function is  normal. There is moderately elevated pulmonary artery systolic pressure.  The tricuspid regurgitant velocity is  3.35 m/s, and with an assumed right atrial pressure of 8 mmHg, the  estimated right ventricular systolic pressure is 52.9 mmHg.   Left Atrium: Left atrial size was mildly dilated.   Right Atrium: Right atrial size was normal in size.   Pericardium: There is no evidence of pericardial effusion.   Mitral Valve: The mitral valve is normal in structure. Trivial mitral  valve regurgitation. No evidence of mitral valve stenosis.   Tricuspid Valve: The tricuspid valve is normal in structure. Tricuspid  valve regurgitation is mild to moderate. No evidence of tricuspid  stenosis.   Aortic Valve:  The aortic valve is normal in structure. Aortic valve  regurgitation is not visualized. No aortic stenosis is present.   Pulmonic Valve: The pulmonic valve was normal in  structure. Pulmonic valve  regurgitation is not visualized. No evidence of pulmonic stenosis.   Aorta: The aortic root is normal in size and structure.   Venous: The inferior vena cava is normal in size with greater than 50%  respiratory variability, suggesting right atrial pressure of 3 mmHg.   IAS/Shunts: No atrial level shunt detected by color flow Doppler.       Recent Labs: 06/01/2022: Pro B Natriuretic peptide (BNP) 228.0 08/24/2022: ALT 8 05/06/2023: BUN 21; Creatinine, Ser 1.27; Hemoglobin 10.3; Platelets 271.0; Potassium 3.7; Sodium 141  Recent Lipid Panel    Component Value Date/Time   CHOL 128 05/20/2022 1504   CHOL 129 07/02/2021 0944   TRIG 100.0 05/20/2022 1504   HDL 47.10 05/20/2022 1504   HDL 56 07/02/2021 0944   CHOLHDL 3 05/20/2022 1504   VLDL 20.0 05/20/2022 1504   LDLCALC 60 05/20/2022 1504   LDLCALC 59 07/02/2021 0944    Physical Exam:    VS:  BP (!) 168/90   Pulse (!) 55   Ht 5\' 5"  (1.651 m)   Wt 214 lb (97.1 kg)   SpO2 95%   BMI 35.61 kg/m     Wt Readings from Last 3 Encounters:  05/14/23 214 lb (97.1 kg)  05/06/23 215 lb 6.4 oz (97.7 kg)  10/26/22 193 lb 6.4 oz (87.7 kg)     GEN: Well nourished, well developed in no acute distress HEENT: Normal NECK: No JVD; No carotid bruits LYMPHATICS: No lymphadenopathy CARDIAC: S1S2 noted,RRR, no murmurs, rubs, gallops RESPIRATORY:  Clear to auscultation without rales, wheezing or rhonchi  ABDOMEN: Soft, non-tender, non-distended, +bowel sounds, no guarding. EXTREMITIES: No edema, No cyanosis, no clubbing MUSCULOSKELETAL:  No deformity  SKIN: Warm and dry NEUROLOGIC:  Alert and oriented x 3, non-focal PSYCHIATRIC:  Normal affect, good insight  ASSESSMENT:    1. Coronary artery disease involving native coronary artery of native  heart without angina pectoris   2. Essential hypertension   3. Sinus bradycardia   4. PVC (premature ventricular contraction)   5. Mixed hyperlipidemia   6. Diabetes mellitus due to underlying condition with hyperosmolarity without coma, without long-term current use of insulin (HCC)   7. Moderate tricuspid regurgitation     PLAN:    Hypertension Elevated blood pressure despite current regimen of Amlodipine, Carvedilol, Avapro, and Clonidine. Hydrochlorothiazide was discontinued due to low kidney function. -Increase Amlodipine to 10mg  daily. -Follow up with the pharmacist in 4 weeks to monitor blood pressure.  Lymphedema Undergoing treatment at a lymphedema clinic, with wrapping of one limb until garments can be obtained. -Continue current treatment plan at lymphedema clinic.  Post-Cataract Surgery Complications Swelling of the retina and fluid accumulation behind the retina following cataract surgery, leading to impaired vision and difficulty reading. -Continue current treatment plan with ophthalmologist, including intraocular injections.  General Health Maintenance / Followup Plans -Follow up with primary care provider in 6 months.   Coronary artery disease-no anginal symptoms.   Diabetes mellitus-this is being managed by the primary team.   The patient understands the need to lose weight with diet and exercise. We have discussed specific strategies for this.  The patient is in agreement with the above plan. The patient left the office in stable condition.  The patient will follow up in   Medication Adjustments/Labs and Tests Ordered: Current medicines are reviewed at length with the patient today.  Concerns regarding medicines are outlined above.  Orders Placed This Encounter  Procedures   AMB Referral to [facility]  Pharm-D   EKG 12-Lead   Meds ordered this encounter  Medications   amLODipine (NORVASC) 10 MG tablet    Sig: Take 1 tablet (10 mg total) by mouth daily.     Dispense:  90 tablet    Refill:  3    Dose change    Patient Instructions  Medication Instructions:  Your physician has recommended you make the following change in your medication:  INCREASE: Amlodipine 10 mg once daily *If you need a refill on your cardiac medications before your next appointment, please call your pharmacy*   Lab Work: None  Testing/Procedures: None   Follow-Up: At [facility], you and your health needs are our priority.  As part of our continuing mission to provide you with exceptional heart care, we have created designated Provider Care Teams.  These Care Teams include your primary Cardiologist (physician) and Advanced Practice Providers (APPs -  Physician Assistants and Nurse Practitioners) who all work together to provide you with the care you need, when you need it.   Your next appointment:   6 month(s)  Provider:   Thomasene Ripple, DO     Other Instructions Referral for Pharm-D: Please see them in 4 weeks    Adopting a Healthy Lifestyle.  Know what a healthy weight is for you (roughly BMI <25) and aim to maintain this   Aim for 7+ servings of fruits and vegetables daily   65-80+ fluid ounces of water or unsweet tea for healthy kidneys   Limit to max 1 drink of alcohol per day; avoid smoking/tobacco   Limit animal fats in diet for cholesterol and heart health - choose grass fed whenever available   Avoid highly processed foods, and foods high in saturated/trans fats   Aim for low stress - take time to unwind and care for your mental health   Aim for 150 min of moderate intensity exercise weekly for heart health, and weights twice weekly for bone health   Aim for 7-9 hours of sleep daily   When it comes to diets, agreement about the perfect plan isnt easy to find, even among the experts. Experts at the [facility] of [employer] developed an idea known as the Healthy Eating Plate. Just imagine a plate divided into logical,  healthy portions.   The emphasis is on diet quality:   Load up on vegetables and fruits - one-half of your plate: Aim for color and variety, and remember that potatoes dont count.   Go for whole grains - one-quarter of your plate: Whole wheat, barley, wheat berries, quinoa, oats, brown rice, and foods made with them. If you want pasta, go with whole wheat pasta.   Protein power - one-quarter of your plate: Fish, chicken, beans, and nuts are all healthy, versatile protein sources. Limit red meat.   The diet, however, does go beyond the plate, offering a few other suggestions.   Use healthy plant oils, such as olive, canola, soy, corn, sunflower and peanut. Check the labels, and avoid partially hydrogenated oil, which have unhealthy trans fats.   If youre thirsty, drink water. Coffee and tea are good in moderation, but skip sugary drinks and limit milk and dairy products to one or two daily servings.   The type of carbohydrate in the diet is more important than the amount. Some sources of carbohydrates, such as vegetables, fruits, whole grains, and beans-are healthier than others.   Finally, stay active  Osvaldo Shipper, DO  05/14/2023 9:37 PM  [facility]

## 2023-05-14 NOTE — Patient Instructions (Addendum)
Medication Instructions:  Your physician has recommended you make the following change in your medication:  INCREASE: Amlodipine 10 mg once daily *If you need a refill on your cardiac medications before your next appointment, please call your pharmacy*   Lab Work: None  Testing/Procedures: None   Follow-Up: At [facility], you and your health needs are our priority.  As part of our continuing mission to provide you with exceptional heart care, we have created designated Provider Care Teams.  These Care Teams include your primary Cardiologist (physician) and Advanced Practice Providers (APPs -  Physician Assistants and Nurse Practitioners) who all work together to provide you with the care you need, when you need it.   Your next appointment:   6 month(s)  Provider:   Thomasene Ripple, DO     Other Instructions Referral for Pharm-D: Please see them in 4 weeks

## 2023-05-17 ENCOUNTER — Ambulatory Visit: Payer: Medicare Other | Admitting: Occupational Therapy

## 2023-05-17 ENCOUNTER — Encounter: Payer: Medicare Other | Admitting: Occupational Therapy

## 2023-05-18 ENCOUNTER — Telehealth: Payer: Self-pay

## 2023-05-18 DIAGNOSIS — I890 Lymphedema, not elsewhere classified: Secondary | ICD-10-CM | POA: Diagnosis not present

## 2023-05-18 NOTE — Patient Outreach (Signed)
Care Coordination   05/18/2023 Name: Emily Wood MRN: [id number] DOB: Nov 11, 1950   Care Coordination Outreach Attempts:  An unsuccessful telephone outreach was attempted for a scheduled appointment today.  Follow Up Plan:  Additional outreach attempts will be made to offer the patient care coordination information and services.   Encounter Outcome:  No Answer   Care Coordination Interventions:  No, not indicated    Kathyrn Sheriff, RN, MSN, BSN, CCM Care Management Coordinator [phone]

## 2023-05-19 ENCOUNTER — Encounter: Payer: Medicare Other | Admitting: Occupational Therapy

## 2023-05-20 ENCOUNTER — Ambulatory Visit: Payer: Self-pay

## 2023-05-20 NOTE — Patient Outreach (Signed)
Care Coordination   Follow Up Visit Note   05/20/2023 Name: Emily Wood MRN: [id number] DOB: 02-Sep-1950  Emily Wood is a 72 y.o. year old female who sees Copland, Gwenlyn Found, MD for primary care. I spoke with  [name] by phone today.  What matters to the patients health and wellness today?  Patient is looking for alternative transportation options.    Goals Addressed             This Visit's Progress    Transportation       Interventions Today    Flowsheet Row Most Recent Value  General Interventions   General Interventions Discussed/Reviewed General Interventions Discussed, General Interventions Reviewed, Community Resources  [Pt will contact Medicare about plans with transportation. Sw informs pt that some plans are removing transportation options. [facility] is available and pt will communicate with providers on scheduling otherwise she will drive.]              SDOH assessments and interventions completed:  No     Care Coordination Interventions:  Yes, provided   Follow up plan: Follow up call scheduled for 06/04/23 at 10am.    Encounter Outcome:  Patient Visit Completed

## 2023-05-20 NOTE — Patient Instructions (Signed)
Visit Information  Thank you for taking time to visit with me today. Please don't hesitate to contact me if I can be of assistance to you.   Following are the goals we discussed today:  Patient will contact Medicare on plans with transportation.   Our next appointment is by telephone on 06/04/23 at 10am  Please call the care guide team at [phone] if you need to cancel or reschedule your appointment.   If you are experiencing a Mental Health or Behavioral Health Crisis or need someone to talk to, please call 911  Patient verbalizes understanding of instructions and care plan provided today and agrees to view in MyChart. Active MyChart status and patient understanding of how to access instructions and care plan via MyChart confirmed with patient.     No further follow up required: 06/04/23 at 10am.  Lysle Morales, BSW Social Worker [phone]

## 2023-05-21 ENCOUNTER — Telehealth: Payer: Self-pay

## 2023-05-21 NOTE — Patient Outreach (Signed)
  Care Coordination   Care Coordination  Visit Note   05/21/2023 Name: Emily Wood MRN: [id number] DOB: 12/20/1950  Emily Wood is a 72 y.o. year old female who sees Copland, Gwenlyn Found, MD for primary care. I spoke with  [name] by phone today.  What matters to the patients health and wellness today?  Return call to patient. Emily Wood reports she called to apologize for missed appointment and request to reschedule.   Goals Addressed             This Visit's Progress    Community resources       Interventions Today    Flowsheet Row Most Recent Value  General Interventions   General Interventions Discussed/Reviewed General Interventions Reviewed  [telephone call rescheduled]            SDOH assessments and interventions completed:  No  Care Coordination Interventions:  Yes, provided   Follow up plan: Follow up call scheduled for 05/28/23    Encounter Outcome:  Patient Visit Completed   Kathyrn Sheriff, RN, MSN, BSN, CCM Care Management Coordinator [phone]

## 2023-05-24 ENCOUNTER — Encounter: Payer: Self-pay | Admitting: Occupational Therapy

## 2023-05-24 ENCOUNTER — Ambulatory Visit: Payer: Medicare Other | Admitting: Occupational Therapy

## 2023-05-24 ENCOUNTER — Ambulatory Visit: Payer: Medicare Other | Attending: Nurse Practitioner | Admitting: Occupational Therapy

## 2023-05-24 DIAGNOSIS — I890 Lymphedema, not elsewhere classified: Secondary | ICD-10-CM | POA: Insufficient documentation

## 2023-05-24 NOTE — Therapy (Signed)
+- OUTPATIENT OCCUPATIONAL THERAPY TREATMENT NOTE  LOWER EXTREMITY LYMPHEDEMA Patient Name: Emily Wood MRN: [id number] DOB:[birth year], 72 y.o., female Today's Date: 05/24/2023  END OF SESSION:   OT End of Session - 05/24/23 0812     Visit Number 45    Number of Visits 72    Date for OT Re-Evaluation 08/03/23    OT Start Time 0815    OT Stop Time 0921    OT Time Calculation (min) 66 min    Activity Tolerance Patient tolerated treatment well;No increased pain    Behavior During Therapy [facility] for tasks assessed/performed               Past Medical History:  Diagnosis Date   Arthritis    Diabetes mellitus without complication (HCC)    Family history of adverse reaction to anesthesia    PONV   Graves disease 04/14/2020   Hypertension    Lymphedema    Mixed hyperlipidemia 02/20/2021   Past Surgical History:  Procedure Laterality Date   CATARACT EXTRACTION W/PHACO Right 04/14/2022   Procedure: CATARACT EXTRACTION PHACO AND INTRAOCULAR LENS PLACEMENT (IOC) RIGHT DIABETIC 10.93 00:53.0;  Surgeon: Galen Manila, MD;  Location: [facility];  Service: Ophthalmology;  Laterality: Right;   CATARACT EXTRACTION W/PHACO Left 04/28/2022   Procedure: CATARACT EXTRACTION PHACO AND INTRAOCULAR LENS PLACEMENT (IOC) LEFT DIABETIC 6.05 00:45.7;  Surgeon: Galen Manila, MD;  Location: [facility];  Service: Ophthalmology;  Laterality: Left;  Diabetic   CHOLECYSTECTOMY     HERNIA REPAIR     OTHER SURGICAL HISTORY     scar tissue removal from bowels   PARTIAL HYSTERECTOMY     Still has ovaries   REVERSE SHOULDER ARTHROPLASTY Right 09/11/2022   Procedure: REVERSE SHOULDER ARTHROPLASTY;  Surgeon: Yolonda Kida, MD;  Location: [facility];  Service: Orthopedics;  Laterality: Right;  120   UTERINE FIBROID SURGERY     Patient Active Problem List   Diagnosis Date Noted   Sinus bradycardia 05/14/2023   PVC (premature ventricular contraction) 05/14/2023    Diabetes mellitus due to underlying condition with hyperosmolarity without coma, without long-term current use of insulin (HCC) 05/14/2023   Normocytic anemia 05/23/2022   AKI (acute kidney injury) (HCC) 05/21/2022   Snoring 11/05/2021   Daytime somnolence 11/05/2021   Coronary artery disease involving native coronary artery of native heart without angina pectoris 05/15/2021   Nonrheumatic tricuspid valve regurgitation 05/15/2021   Pulmonary hypertension, unspecified (HCC) 05/15/2021   Mixed hyperlipidemia 02/20/2021   Obesity (BMI 30-39.9) 02/20/2021   Diabetic retinopathy (HCC) 08/28/2020   Mixed incontinence 05/31/2020   Graves disease 04/14/2020   Right renal mass 11/22/2019   Arthritis of left knee 09/25/2019   Lymphedema 01/25/2017   Fibroid uterus 01/14/2017   Chronic kidney disease, stage 3 (HCC) 09/21/2016   Meralgia paresthetica of left side 08/27/2015   Diabetes mellitus type 2, controlled (HCC) 08/02/2015   Chronic venous insufficiency 06/28/2015   Essential hypertension 02/15/2015   Peripheral edema 02/15/2015   Chronic knee pain 02/15/2015    PCP: Pearline Cables, MD  REFERRING PROVIDER: Sheppard Plumber, NP  REFERRING DIAG: I89.0  THERAPY DIAG:  Lymphedema, not elsewhere classified  Rationale for Evaluation and Treatment: Rehabilitation  ONSET DATE: insidious onset >30 years  SUBJECTIVE:  SUBJECTIVE STATEMENT: [name] presents to Occupational Therapy for treatment of BLE lymphedema. She arrives with knee length, multilayer compression wraps in place on LLE and compression stocking in place on the R.  She is unaccompanied and walks to clinic. Pt denies LE-related leg pain, but endorses OA related R knee pain 8/10, despite recent gel injection. Pt brings initial LLE  compression stocking and HOS device to clinic for assessment and initial fitting.  PERTINENT HISTORY:  Medical Hx contributing to, or affected by chronic, progressive lymphedema: Norvasc prescription-common side effect is leg swelling; HTN, CVI, Obesity, CAD, OA knees, CKD stage 3, Graves Disease, DM  PAIN:  Are you having pain? Yes: R arthritis knee pain;  8/10 Pain description: stiff, aching, throbbing, tight, heavy Aggravating factors: standing , walking,  Relieving factors: "The medication from my cardiologist", elevation, compression stockings ( Pt has good hip AROM bilaterally and is able to don them herself w extra time in clinic.)  PRECAUTIONS: Fall and Other: LYMPHEDEMA PRECAUTIONS: (CKD, CAD and DM, GRAVES )   WEIGHT BEARING RESTRICTIONS: No  FALLS: Has patient fallen since last visit? NO    HAND DOMINANCE: right   PRIOR LEVEL OF FUNCTION: Requires assistive device for independence, Needs assistance with homemaking, and Needs assistance with gait  PATIENT GOALS: ".... to get this lymphedema under control. It's embarrassing. That's why I wear long skirts."   OBJECTIVE:   Mild, Stage  II, Bilateral Lower Extremity Lymphedema 2/2 CVI and Obesity  OBSERVATIONS / OTHER ASSESSMENTS:   FOTO functional outcome scale: Intake 11/16/22: 63% 05/24/23 final: 74% goal met WITH 11% FUNCTIONAL performance increase.  Lymphedema Life Impact Scale (LLIS) score: Initial 10/22/22 = 50%. Final score is 36.76 %. Total lymphedema life impact reduction for this OT CDT course measures 13.24%. (The extent to which L:E-related problems affected your life during the past week)  BLE COMPARATIVE LIMB VOLUMETRICS:  Intake measurements completed 10/22/22  LANDMARK RIGHT   R LEG (A-D) 5043.4 ml  R THIGH (E-G) 4182.3 ml  R FULL LIMB (A-G) 7643.8 ml  Limb Volume differential (LVD)   Leg LVD =12.14%, L>R Thigh LVD 8.3%, L>R Full limb LVD= 8.3%   Volume change since initial %  Volume change overall V   (Blank rows = not tested)  LANDMARK LEFT    R LEG (A-D) 5656.0 ml  R THIGH (E-G) 4529.5 ml  R FULL LIMB (A-G) 10185.5 ml  Limb Volume differential (LVD)  %  Volume change since initial %  Volume change overall %  (Blank rows = not tested)   9th Visit: 12/07/22  [facility] RIGHT   R LEG (A-D) 5116.3 ml  R THIGH (E-G) 5047.6  ml  R FULL LIMB (A-G) 10163.9  ml  Limb Volume differential (LVD)    Volume change since initial R LEG volume INCREASED by 1.4%;  R THIGH INCREASED by 20.7%, and R FULL LIMB INCREASED by 10.17% since initial on 10/22/22.  Volume change overall V  (Blank rows = not tested)       19 th visit 01/18/23  LANDMARK RIGHT   R LEG (A-D) 4606.5 ml  R THIGH (E-G) 4617.0 ml  R FULL LIMB (A-G) 9224 ml  Limb Volume differential (LVD)    Volume change since initial Since last measured on 9th Rx visit on 12/07/22, R LEG volume is DECREASED by 9.96 %, R THIGH volume is DECREASED by 8.5 %, and R FULL LIMB volume is DECREASED by 9.25 %   Volume change overall Since initially  measured on 10/22/22 on 1st Rx visit,  R LEG volume is DECREASED by 8.67 % overall, R THIGH volume is INCREASED by 10 %, and R FULL LIMB volume is DECREASED by 0.018 %    29 th Visit 03/02/23   LANDMARK RIGHT   R LEG (A-D) 5749.7 ml  R THIGH (E-G)  ml  R FULL LIMB (A-G)  ml  Limb Volume differential (LVD)    Volume change since last Since 01/18/23 R leg  is INCREASED by 15.15%.  Volume change overall   (Blank rows = not tested)  LANDMARK LEFT    L LEG (A-D) Since initial 11/11/22 L LEG is INCREASED by 1.66%.   L THIGH (E-G)   L FULL LIMB (A-G)   Limb Volume differential (LVD)    Volume change since initial +1.66%  Volume change overall    39 th Visit 04/07/23  LANDMARK LEFT  L  LEG (A-D) 5297.8  ml  L THIGH (E-G)  ml  L FULL LIMB (A-G)  ml  Limb Volume differential (LVD)    Volume change since last -7.9%  Volume change overall     TODAY'S TREATMENT:  LLE multilayer gradient compression  wraps-knee high Pt edu for LE self-care and reviewed progress towards goals to date                  PATIENT EDUCATION:  Pt edu for LE self care:  Continued Pt/ CG edu for lymphedema self care home program throughout session. Emphasis today on lymphedema precautions and cellulitis ID, prevention and Rx. Discussed again fluid dynamics re elevation, and importance of elevation for lymphedema management and venous return. Other topics include outcome of comparative limb volumetrics- starting limb volume differentials (LVDs), technology and gradient techniques used for short stretch, multilayer compression wrapping, simple self-MLD, therapeutic lymphatic pumping exercises, skin/nail care, LE precautions,. compression garment recommendations and specifications, wear and care schedule and compression garment donning / doffing w assistive devices. Discussed progress towards all OT goals since commencing CDT. All questions answered to the Pt's satisfaction. Good return. Person educated: Patient Education method: Explanation, Demonstration, Tactile cues, Verbal cues, and Handouts Education comprehension: verbalized understanding, returned demonstration, and needs further education  HOME EXERCISE PROGRAM: Intensive Phase Complete Decongestive Therapy (CDT) includes Daily Manual lymphatic drainage: Short neck sequence bilaterally w diaphragmatic breathing to engage deep abdominal lymphatics. Stationary J strokes to inguinal LN, then proximal to distal dynamic J strokes to thigh, "bottleneck" region, leg and foot. Finally 3 retrograde sweeps back to terminus. Daily Skin care w/ low ph lotion matching skin ph to limit infection risk and improve tissue flexibility and excursion Daily, BLE lymphatic pumping therex- seated or supine Daily (23/7) multilayer compression bandages below the knee to reduce limb volume, one limb at a time (One each 8 cm, 10 cm and 12 cm wide x 5 meters long short stretch compression wrap  applied circumferentially using gradient layering techniques over single layer of cotton stockinett an Rosidal foam- toes to popliteal fossa  Self-management Phase CDT includes: Appropriate daytime compression garments and HOS devices full time daily. Multiples needed  for hygiene. Replace q 3-6 months Elvarex, flat knit, ccl 225-32 mmHg) knee highs w open toe, t heel, oblique top edge, 2.5 cm wide SB on top, tricot patch at anterior ankle sewn on all 4 sides. To be work full time waking hours. Jobst RELAX knee high HOS device  Custom-made gradient compression garments and HOS devices are medically necessary in this case because they  are uniquely sized and shaped to fit the exact dimensions of the affected extremities with deformities, and to provide accurate and consistent gradient compression and containment, essential to optimally managing this patient's symptoms of chronic, progressive lymphedema. Multiple custom compression garments are needed for optimal hygiene to limit infection risk. Custom compression garments should be replaced q 3-6 months When worn consistently for optimal lipo-lymphedema self-management over time.   ASSESSMENT:  CLINICAL IMPRESSION:  This visit and fitting of LLE compression marks transition from Intensive Phase to Self-Management Phase CDT for lymphedema care. Initial Lymphedema Life Impact score calculated on 10/22/22 measured 50%. Final score calculated today is 36.76 %. Total lymphedema life impact reduction for this OT CDT course measures 13.24%. This value meets and exceeds Rx goal of 5 Pt decrease in LE life impact since commencing OT.   The initial FOTO functional outcome score calculated on 11/16/22 measured 63%The final score calculated this date reveals an increase in function measuring 11%. This value also meets and exceeds the FOTO goal.   Initial R knee length custom compression garment and HOS device appear to fit very well and function appropriately. Pt is  able to don    and doff with modified independence ( w assistive device) and she expresses comfort in garments.  OBJECTIVE IMPAIRMENTS: low vision, Abnormal gait, decreased activity tolerance, decreased balance, decreased endurance, decreased knowledge of condition, decreased knowledge of use of DME, decreased mobility, decreased ROM, increased edema, impaired flexibility, postural dysfunction, obesity, and pain.   ACTIVITY LIMITATIONS: functional mobility and ambulation (carrying, lifting, bending, sitting, standing, squatting, sleeping, stairs, transfers, bed mobility, continence, Basic and instrumental ADLs (bathing, dressing, hygiene/grooming, Productive activities ( caring for others) and leisure pursuits, body image, and social participation  PERSONAL FACTORS:  multiple co morbidities, length of time with progressive condition, are also affecting patient's functional outcome.   REHAB POTENTIAL: Fair    EVALUATION COMPLEXITY: Moderate   GOALS: Goals reviewed with patient? Yes  SHORT TERM GOALS: Target date: 4th OT Rx visit   Pt will demonstrate understanding of lymphedema precautions and prevention strategies with modified independence using a printed reference to identify at least 5 precautions and discussing how s/he may implement them into daily life to reduce risk of progression with extra time (Modified Independence). Baseline:Max A Goal status: 12/07/22 GOAL MET  2.  Pt will be able to apply multilayer, thigh length, gradient, compression wraps to one leg at a time with modified assistance (extra time) to decrease limb volume, to limit infection risk, and to limit lymphedema progression.  Baseline: Dependent Goal status: 11/16/22 PROGRESSING 11/26/22 GOAL MET  LONG TERM GOALS: Target date: 02/01/23  Given this patient's Intake score of 63/100% on the functional outcomes FOTO tool, patient will experience an increase in function of 3 points to improve basic and instrumental ADLs  performance, including lymphedema self-care.  Baseline: ONGOING   Goal status:12/07/22 GOAL ONGOING,  01/20/23 PROGRESSING 03/03/23 PROGRESSING; 04/12/23 PROGRESSING. 05/24/23 GOAL MET w/ score of 74%, revealing 11% increase in function since commencing OT.  2.  Given this patient's Intake score of 50.0 % on the Lymphedema Life Impact Scale (LLIS), patient will experience a reduction of at least 5 points in her perceived level of functional impairment resulting from lymphedema to improve functional performance and quality of life (QOL). Baseline: 50% Goal status: 12/07/22 GOAL ONGOING; 01/20/23 PROGRESSING,  03/03/23 PROGRESSING 04/12/23 PROGRESSING. 05/24/23 GOAL MET w/ score of 36.76%, revealing 13.24% reduction % in LE life impact since commencing OT.  3.  During Intensive phase CDT Pt will achieve at least 85% compliance with all lymphedema self-care home program components, including daily skin care, compression wraps and /or garments, simple self MLD and lymphatic pumping therex, and elevation when sitting to habituate LE self care protocol  into ADLs for optimal LE self-management over time. Baseline: Dependent Goal status: 12/08/22 ONGOING. Pt not changing wraps daily as directed, but will work on increasing elevation anytime she is seated for next progress report. Pt will also increase frequency of re-wrapping her leg to ensure optimal volume reduction. GOAL MET 01/20/23  4.  Pt will achieve at least a 10% volume reduction below the knees to return limb to typical size and shape, to limit infection risk and LE progression, to decrease pain, to improve function. Baseline: dependent Goal status:  12/07/22 PROGRESSING 01/18/23 : Leg reduction= 8.67% since initial. Thigh  is Increased 10% since initial, And full LE is decreased by 0.018% since 11/11/22;   01/20/23 GOAL MET for R LEG w 9.96 % reduction  03/01/23 BLE comparative limb volumetrics reveal a  significant 15.15% increase in limb volume since last measured  on 01/18/23. The LLE , the current Rx limb, is increased by 1.66% since initially measured in 4/24.   04/07/23 PROGRESSING with 7.9% reduction in the L leg since 03/01/23. See volumetric chart for additional details.  5.  Pt will obtain proper compression garments/compression braces and be modified independent with donning/doffing to optimize/stabilize with fluid reduction. Baseline:  Goal status: 12/07/22 PROGRESSING. 01/20/23 PROGRESSING.02/08/23 GOAL MET for RLE, 04/12/23 PROGRESSING for LLE. 05/24/23 GOAL MET for LLE.  PLAN:  OT FREQUENCY: Return in 1 week for fitting follow up, to review self care and to measures final BLE volumetrics.  )T DURATION: Ongoing and PRN for self-management support  PLANNED INTERVENTIONS: Therapeutic exercises, Therapeutic activity, Patient/Family education, Self Care, Manual lymph drainage, Compression bandaging, scar mobilization, Manual therapy, and skin care throughout MLD  PLAN FOR NEXT SESSION:  Complete final BLE comparative limb volumetrics. Complete final assessment of L compression garments/ devices Complete Intensive Phase LE self-care edu for home program and transition to Self-management Phase of CDT  Loel Dubonnet, MS, OTR/L, CLT-LANA 05/24/23 9:34 AM

## 2023-05-24 NOTE — Patient Instructions (Signed)
Lymphedema Self- Care Instructions   1. EXERCISE: Perform lymphatic pumping there ex at least 2 x a day while wearing your compression wraps or garments. Perform 10 reps of each exercise bilaterally and be sure to perform them in order. Don't skip around!  OMIT PARTIAL SIT UPs.  2. MLD: Perform simple self-manual lymphatic drainage (MLD) at least once a day as directed. Take your time! Breathe! ;-)  3. If you have a Flexitouch advanced "pump" use it 1 time each day on a single limb only. The Flexitouch moves lymphatic fluid out of your affected body part and back to your heart, so DO NOT use the Flexi on 2 legs at a time, and DO NOT ues it on 2 legs on the same day. If you experience any atypical shortness of breath, sudden onset of pain, or feelings of heart arhythmia, or racing, discontinue use of the Flexitouch and report these symptoms to your doctor right away. Also, discontinue Flexi if you have an infection or a fever. It's OK to resume using the device 72 hours AFTER your first dose of oral antibiotic.   4. 4. WRAPS: Compression wraps are to be worn 23 hrs/ 7 days/wk during Intensive Phase of Complete Decongestive Therapy (CDT).Building tolerance may take time and practice, so don't get discouraged. If bandages begin to feel tight during periods of inactivity and/or during the night, try performing your exercises to loosen them. Do not leave short stretch wraps in place for > 23 hours. It is very important that you remove all wraps daily to inspect skin, bathe and perform skin care before reapplying your wraps.  5. Daytime GARMENTS/ HOS DEVICES: During the Self-Management Phase CDT your compression garments are to be worn during waking hours when active. Do NOT sleep in your garments!!  Don daytime garments first thing in the morning. Do not wear your HOS devices all day instead of garments. These will not contain your swelling.  6. PUT YOUR FEET UP! Elevate your feet and legs and feet to the  level of your heart whenever you are sitting down.   7. SKIN: Carefully monitor skin condition and perform impeccable hygiene daily. Bathe skin with mild soap and water and apply low pH lotion (aka Eucerin ) to improve hydration and limit infection risk.  

## 2023-05-25 ENCOUNTER — Other Ambulatory Visit (INDEPENDENT_AMBULATORY_CARE_PROVIDER_SITE_OTHER): Payer: Medicare Other

## 2023-05-25 DIAGNOSIS — D649 Anemia, unspecified: Secondary | ICD-10-CM

## 2023-05-26 ENCOUNTER — Ambulatory Visit: Payer: Medicare Other | Admitting: Occupational Therapy

## 2023-05-26 ENCOUNTER — Encounter: Payer: Self-pay | Admitting: Family Medicine

## 2023-05-26 ENCOUNTER — Other Ambulatory Visit: Payer: Self-pay | Admitting: Family Medicine

## 2023-05-26 DIAGNOSIS — E119 Type 2 diabetes mellitus without complications: Secondary | ICD-10-CM

## 2023-05-26 LAB — FECAL OCCULT BLOOD, IMMUNOCHEMICAL: Fecal Occult Bld: NEGATIVE

## 2023-05-28 ENCOUNTER — Ambulatory Visit: Payer: Self-pay

## 2023-05-28 NOTE — Patient Instructions (Signed)
Visit Information  Thank you for taking time to visit with me today. Please don't hesitate to contact me if I can be of assistance to you.   Following are the goals we discussed today:  Continue to take medications as prescribed. Continue to attend provider visits as scheduled Continue to eat healthy, lean meats, vegetables, fruits, avoid saturated and transfats Contact your provider with health questions or concerns Contact the number on the back of your insurance card to discuss your over the counter benefits  If you are experiencing a Mental Health or Behavioral Health Crisis or need someone to talk to, please call the Suicide and Crisis Lifeline: 988 call the [place] National Suicide Prevention Lifeline: 939-244-1497 or TTY: 414-174-4711 TTY 346 504 3179) to talk to a trained counselor call 1-800-273-TALK (toll free, 24 hour hotline)  Kathyrn Sheriff, RN, MSN, BSN, CCM Care Management Coordinator [phone]

## 2023-05-28 NOTE — Patient Outreach (Signed)
  Care Coordination   Follow Up Visit Note   05/28/2023 Name: Emily Wood MRN: [id number] DOB: 1950/11/21  Emily Wood is a 72 y.o. year old female who sees Copland, Gwenlyn Found, MD for primary care. I spoke with  Emily Wood by phone today.  What matters to the patients health and wellness today?  Emily Wood reports she has spoken with BSW for community resources and clinical pharmacist regarding medications. She denies any additional care management needs. Emily Wood to contact RNCM if additional needs in the future. Contact number provided.   Goals Addressed             This Visit's Progress    COMPLETED: Community resources       Interventions Today    Flowsheet Row Most Recent Value  Chronic Disease   Chronic disease during today's visit Hypertension (HTN)  General Interventions   General Interventions Discussed/Reviewed General Interventions Reviewed  [Evaluation of current treatment plan for health condition and patient's adherence to plan.]  Exercise Interventions   Exercise Discussed/Reviewed Exercise Discussed  [confirmed active with rehab for therapy.]  Education Interventions   Education Provided Provided Education  [Reiterated contact insurance company to obtain OTC catalog if she if she does not have one and to discuss obtaining BP monitor with large cuff. discucced importance of checking BP in managing health condition]  Provided Verbal Education On Medication, When to see the doctor, Nutrition  [advised to eat healthy, take medications as prescribed, contact provider with health questions or concers as needed]  Nutrition Interventions   Nutrition Discussed/Reviewed Nutrition Discussed  Pharmacy Interventions   Pharmacy Dicussed/Reviewed Pharmacy Topics Discussed  [encouraged to continue to work with clinical pharmacist as recommended]            SDOH assessments and interventions completed:  No  Care Coordination Interventions:   Yes, provided   Follow up plan: No further intervention required.   Encounter Outcome:  Patient Visit Completed   Emily Sheriff, RN, MSN, BSN, CCM Care Management Coordinator [phone]

## 2023-05-31 ENCOUNTER — Encounter: Payer: Medicare Other | Admitting: Occupational Therapy

## 2023-05-31 ENCOUNTER — Ambulatory Visit: Payer: Medicare Other | Admitting: Family Medicine

## 2023-05-31 ENCOUNTER — Encounter: Payer: Self-pay | Admitting: Pharmacist

## 2023-05-31 DIAGNOSIS — I10 Essential (primary) hypertension: Secondary | ICD-10-CM

## 2023-05-31 DIAGNOSIS — E119 Type 2 diabetes mellitus without complications: Secondary | ICD-10-CM

## 2023-05-31 DIAGNOSIS — N1831 Chronic kidney disease, stage 3a: Secondary | ICD-10-CM

## 2023-05-31 DIAGNOSIS — R600 Localized edema: Secondary | ICD-10-CM

## 2023-05-31 MED ORDER — BLOOD PRESSURE MONITOR MISC: Status: AC

## 2023-05-31 NOTE — Progress Notes (Signed)
05/31/2023 Name: JERYL SCIULLO MRN: [id number] DOB: 29-Jul-1950  Chief Complaint  Patient presents with   Hypertension   Diabetes   Medication Management    Emily Wood is a 72 y.o. year old female who presented for a telephone visit.   They were referred to the pharmacist by their PCP for assistance in managing hypertension, medication access, and complex medication management.  Patient is also on list as having uncontrolled hypertension.    Subjective:  Care Team: Primary Care Provider: Pearline Cables, MD ; Next Scheduled Visit: not currently scheduled Cardiologist: Dr Servando Salina; Next Scheduled Visit: not currently scheduled, Mrs. Hoven will follow up with clinical pharmacist at cardiology office for follow up HTN Endocrinologist [facility]; Next Scheduled Visit: 07/12/2023 Orthopedist: Dr Charlann Boxer; Next Scheduled Visit: unable to see Nephrologist - [facility]. LV was 03/25/2023 - unable to see next appointment.  Vascular Specialist - Next appointment 08/23/2023  Medication Access/Adherence  Current Pharmacy:  [id number] - [place], [place] - [address] AT [facility] [address] [place] [address] Phone: [phone] Fax: [phone]  [facility] - [place], [place] - [address] [facility] [address] [place] [address] [place] [address] Phone: [phone] Fax: [phone]  [facility] - [place], [place] - [address] [place] [address] Phone: [phone] Fax: [phone]   Patient reports affordability concerns with their medications: Yes  - has not been able to afford Gemtesa. She is still taking oxybutynin Patient is getting Ozempic from [employer] patient assistance program thru her endocrinology office.  Patient reports access/transportation concerns to their pharmacy: No  Patient reports adherence  concerns with their medications:  Yes  - has difficulty remembering dose of clonidine in the middle of the day.      Hypertension:  Current medications: clonidine 0.2mg  3 times a day, irbesartan 330m daily, amlodipine 10mg  daily, carvedilol 12.5mg  twice a day and torsemide 10mg  daily as needed.    Patient has a validated, automated, upper arm home BP cuff that was supplied by cardiology office (brand is CAZON) but she states that the normal sized cuff is too small for her.  She has not been able to purchase blood pressure cuff with large cuff.  Current blood pressure readings readings: nurse visit to her home on 05/06/2023 - blood pressure was 139/66.  Edema - attending lymphedema clinic. She feels edema has not changed much since amlodipine dose was increased.   Patient denies hypotensive s/sx including no dizziness, lightheadedness.  Patient denies hypertensive symptoms including no headache, chest pain, shortness of breath  Current physical activity: has join [employer] but has not start going yet.    Medication Management:   She has continues to take oxybutynin. She was not able to afford Gemtesa and currently Gemtesa dose not have medication assistance program.  She reports she ran out of methimazole last week. She has contacted [facility] because she did not find the September 21st refill. [facility] is sending replacement - should arrive by 06/02/23.   Patient reports Good adherence to medications  Patient reports the following barriers to adherence:  Complex medication regimen Medication cost Lack of tools and confidence for self-management of hypertension  Fear of medication side effects    Objective:  BP Readings from Last 3 Encounters:  05/14/23 (!) 168/90  05/06/23 (!) 150/80  10/26/22 (!) 144/88    Lab Results  Component  Value Date   HGBA1C 6.4 05/06/2023    Lab Results  Component Value Date   CREATININE 1.27 (H) 05/06/2023   BUN 21 05/06/2023   NA 141  05/06/2023   K 3.7 05/06/2023   CL 102 05/06/2023   CO2 27 05/06/2023    Lab Results  Component Value Date   CHOL 128 05/20/2022   HDL 47.10 05/20/2022   LDLCALC 60 05/20/2022   TRIG 100.0 05/20/2022   CHOLHDL 3 05/20/2022    Medications Reviewed Today   Medications were not reviewed in this encounter       Assessment/Plan:   Hypertension: - Currently uncontrolled per last office blood pressure.  - Reviewed long term cardiovascular and renal outcomes of uncontrolled blood pressure - Recommended she set a phone alarm to remind her to take mid day dose of clonidine.  - The patients has a diagnosis of hypertension and it is medically necessary for them to have access to a home device to monitor blood pressure.  The patient does not have readily available insurance access to a device and cannot afford to purchase a device at this time.  The patient has been counseled that they do not need to continue to receive services from [facility] to receive a device.  The patient will be given a device free of charge. - Will supply patient with Omron blood pressure cuff since she has not been able to afford with her over-the-counter benefits (has to use benefits for her incontinence supplies). I will have to get large blood pressure cuff for her so might take 3 to 5 days.  - Recommend to continue amlodipine, clonidine, carvedilol, irbesartan and torsemide.  .  Hyperlipidemia/ASCVD Risk Reduction:Last LDL at goal but due to recheck.   - Reviewed long term complications of uncontrolled cholesterol - Recommend to take atorvastatin 40mg  every day   Medication Management: - Discussed adherence strategies like setting alarms on phone to remind her to take medication and using weekly pill containers.  - Discussed changes to Medicare part D in 2025. Patient is planning to review plans soon. She asked if I could send her list of plans that will have transportation benefits in 2025. Sent thru  [employer]. Also encouraged patient make sure she pays attention to possible deductible for medications in 2025 as some plans have added deductibles.  - Reminded patient that she can request refills form [employer] medication assistance program thru 06/19/2023. She also will start 2025 application for [employer] and take to endocrinology office.    Follow Up Plan: will have blood pressure rechecked in 2 weeks at cardiology office. I will follow up on January 2025 to make sure no issues with medications / medication costs.   Henrene Pastor, PharmD Clinical Pharmacist [facility]

## 2023-06-02 ENCOUNTER — Ambulatory Visit: Payer: Medicare Other | Admitting: Occupational Therapy

## 2023-06-02 DIAGNOSIS — I890 Lymphedema, not elsewhere classified: Secondary | ICD-10-CM

## 2023-06-02 NOTE — Therapy (Signed)
+- OUTPATIENT OCCUPATIONAL THERAPY TREATMENT NOTE  LOWER EXTREMITY LYMPHEDEMA Patient Name: Emily Wood MRN: [id number] DOB:1950/12/05, 72 y.o., female Today's Date: 06/02/2023  END OF SESSION:   OT End of Session - 06/02/23 0807     Visit Number 46    Number of Visits 72    Date for OT Re-Evaluation 08/03/23    OT Start Time 0805    OT Stop Time 0915    OT Time Calculation (min) 70 min    Activity Tolerance Patient tolerated treatment well;No increased pain    Behavior During Therapy [facility] for tasks assessed/performed               Past Medical History:  Diagnosis Date   Arthritis    Diabetes mellitus without complication (HCC)    Family history of adverse reaction to anesthesia    PONV   Graves disease 04/14/2020   Hypertension    Lymphedema    Mixed hyperlipidemia 02/20/2021   Past Surgical History:  Procedure Laterality Date   CATARACT EXTRACTION W/PHACO Right 04/14/2022   Procedure: CATARACT EXTRACTION PHACO AND INTRAOCULAR LENS PLACEMENT (IOC) RIGHT DIABETIC 10.93 00:53.0;  Surgeon: Galen Manila, MD;  Location: [facility];  Service: Ophthalmology;  Laterality: Right;   CATARACT EXTRACTION W/PHACO Left 04/28/2022   Procedure: CATARACT EXTRACTION PHACO AND INTRAOCULAR LENS PLACEMENT (IOC) LEFT DIABETIC 6.05 00:45.7;  Surgeon: Galen Manila, MD;  Location: [facility];  Service: Ophthalmology;  Laterality: Left;  Diabetic   CHOLECYSTECTOMY     HERNIA REPAIR     OTHER SURGICAL HISTORY     scar tissue removal from bowels   PARTIAL HYSTERECTOMY     Still has ovaries   REVERSE SHOULDER ARTHROPLASTY Right 09/11/2022   Procedure: REVERSE SHOULDER ARTHROPLASTY;  Surgeon: Yolonda Kida, MD;  Location: [facility];  Service: Orthopedics;  Laterality: Right;  120   UTERINE FIBROID SURGERY     Patient Active Problem List   Diagnosis Date Noted   Sinus bradycardia 05/14/2023   PVC (premature ventricular contraction) 05/14/2023    Diabetes mellitus due to underlying condition with hyperosmolarity without coma, without long-term current use of insulin (HCC) 05/14/2023   Normocytic anemia 05/23/2022   AKI (acute kidney injury) (HCC) 05/21/2022   Snoring 11/05/2021   Daytime somnolence 11/05/2021   Coronary artery disease involving native coronary artery of native heart without angina pectoris 05/15/2021   Nonrheumatic tricuspid valve regurgitation 05/15/2021   Pulmonary hypertension, unspecified (HCC) 05/15/2021   Mixed hyperlipidemia 02/20/2021   Obesity (BMI 30-39.9) 02/20/2021   Diabetic retinopathy (HCC) 08/28/2020   Mixed incontinence 05/31/2020   Graves disease 04/14/2020   Right renal mass 11/22/2019   Arthritis of left knee 09/25/2019   Lymphedema 01/25/2017   Fibroid uterus 01/14/2017   Chronic kidney disease, stage 3 (HCC) 09/21/2016   Meralgia paresthetica of left side 08/27/2015   Diabetes mellitus type 2, controlled (HCC) 08/02/2015   Chronic venous insufficiency 06/28/2015   Essential hypertension 02/15/2015   Peripheral edema 02/15/2015   Chronic knee pain 02/15/2015    PCP: Pearline Cables, MD  REFERRING PROVIDER: Sheppard Plumber, NP  REFERRING DIAG: I89.0  THERAPY DIAG:  Lymphedema, not elsewhere classified  Rationale for Evaluation and Treatment: Rehabilitation  ONSET DATE: insidious onset >30 years  SUBJECTIVE:  SUBJECTIVE STATEMENT: Emily Wood presents to Occupational Therapy for treatment of BLE lymphedema. She arrives with bilateral, knee length, custom, flat knit, ccl 2 compression stockings in place. She is unaccompanied and walks to clinic. Pt denies LE-related leg pain, but endorses OA related R knee pain 8/10, despite recent gel injection. Pt reports she has washed her new LLE garment. "It  tends to roll down at the outer edge when I pull it up."  PERTINENT HISTORY:  Medical Hx contributing to, or affected by chronic, progressive lymphedema: Norvasc prescription-common side effect is leg swelling; HTN, CVI, Obesity, CAD, OA knees, CKD stage 3, Graves Disease, DM  PAIN:  Are you having pain? Yes: R arthritis knee pain;  8/10 Pain description: stiff, aching, throbbing, tight, heavy Aggravating factors: standing , walking,  Relieving factors: "The medication from my cardiologist", elevation, compression stockings ( Pt has good hip AROM bilaterally and is able to don them herself w extra time in clinic.)  PRECAUTIONS: Fall and Other: LYMPHEDEMA PRECAUTIONS: (CKD, CAD and DM, GRAVES )   WEIGHT BEARING RESTRICTIONS: No  FALLS: Has patient fallen since last visit? NO    HAND DOMINANCE: right   PRIOR LEVEL OF FUNCTION: Requires assistive device for independence, Needs assistance with homemaking, and Needs assistance with gait  PATIENT GOALS: ".... to get this lymphedema under control. It's embarrassing. That's why I wear long skirts."   OBJECTIVE:   Mild, Stage  II, Bilateral Lower Extremity Lymphedema 2/2 CVI and Obesity  OBSERVATIONS / OTHER ASSESSMENTS:   FOTO functional outcome scale: Intake 11/16/22: 63% 05/24/23 final: 74% goal met WITH 11% FUNCTIONAL performance increase.  Lymphedema Life Impact Scale (LLIS) score: Initial 10/22/22 = 50%. Final score is 36.76 %. Total lymphedema life impact reduction for this OT CDT course measures 13.24%. (The extent to which L:E-related problems affected your life during the past week)  BLE COMPARATIVE LIMB VOLUMETRICS:  Intake measurements completed 10/22/22  LANDMARK RIGHT   R LEG (A-D) 5043.4 ml  R THIGH (E-G) 4182.3 ml  R FULL LIMB (A-G) 7643.8 ml  Limb Volume differential (LVD)   Leg LVD =12.14%, L>R Thigh LVD 8.3%, L>R Full limb LVD= 8.3%   Volume change since initial %  Volume change overall V  (Blank rows = not  tested)  LANDMARK LEFT    R LEG (A-D) 5656.0 ml  R THIGH (E-G) 4529.5 ml  R FULL LIMB (A-G) 10185.5 ml  Limb Volume differential (LVD)  %  Volume change since initial %  Volume change overall %  (Blank rows = not tested)   9th Visit: 12/07/22  [facility] RIGHT   R LEG (A-D) 5116.3 ml  R THIGH (E-G) 5047.6  ml  R FULL LIMB (A-G) 10163.9  ml  Limb Volume differential (LVD)    Volume change since initial R LEG volume INCREASED by 1.4%;  R THIGH INCREASED by 20.7%, and R FULL LIMB INCREASED by 10.17% since initial on 10/22/22.  Volume change overall V  (Blank rows = not tested)       19 th visit 01/18/23  LANDMARK RIGHT   R LEG (A-D) 4606.5 ml  R THIGH (E-G) 4617.0 ml  R FULL LIMB (A-G) 9224 ml  Limb Volume differential (LVD)    Volume change since initial Since last measured on 9th Rx visit on 12/07/22, R LEG volume is DECREASED by 9.96 %, R THIGH volume is DECREASED by 8.5 %, and R FULL LIMB volume is DECREASED by 9.25 %   Volume change overall Since  initially measured on 10/22/22 on 1st Rx visit,  R LEG volume is DECREASED by 8.67 % overall, R THIGH volume is INCREASED by 10 %, and R FULL LIMB volume is DECREASED by 0.018 %    29 th Visit 03/02/23   LANDMARK RIGHT   R LEG (A-D) 5749.7 ml  R THIGH (E-G)  ml  R FULL LIMB (A-G)  ml  Limb Volume differential (LVD)    Volume change since last Since 01/18/23 R leg  is INCREASED by 15.15%.  Volume change overall   (Blank rows = not tested)  LANDMARK LEFT    L LEG (A-D) Since initial 11/11/22 L LEG is INCREASED by 1.66%.   L THIGH (E-G)   L FULL LIMB (A-G)   Limb Volume differential (LVD)    Volume change since initial +1.66%  Volume change overall    39 th Visit 04/07/23  LANDMARK LEFT  L  LEG (A-D) 5297.8  ml  L THIGH (E-G)  ml  L FULL LIMB (A-G)  ml  Limb Volume differential (LVD)    Volume change since last -7.9%  Volume change overall     TODAY'S TREATMENT:  LLE multilayer gradient compression wraps-knee high Pt edu for  LE self-care and reviewed progress towards goals to date                  PATIENT EDUCATION:  Pt edu for LE self care:  Continued Pt/ CG edu for lymphedema self care home program throughout session. Emphasis today on lymphedema precautions and cellulitis ID, prevention and Rx. Discussed again fluid dynamics re elevation, and importance of elevation for lymphedema management and venous return. Other topics include outcome of comparative limb volumetrics- starting limb volume differentials (LVDs), technology and gradient techniques used for short stretch, multilayer compression wrapping, simple self-MLD, therapeutic lymphatic pumping exercises, skin/nail care, LE precautions,. compression garment recommendations and specifications, wear and care schedule and compression garment donning / doffing w assistive devices. Discussed progress towards all OT goals since commencing CDT. All questions answered to the Pt's satisfaction. Good return. Person educated: Patient Education method: Explanation, Demonstration, Tactile cues, Verbal cues, and Handouts Education comprehension: verbalized understanding, returned demonstration, and needs further education  HOME EXERCISE PROGRAM: Intensive Phase Complete Decongestive Therapy (CDT) includes Daily Manual lymphatic drainage: Short neck sequence bilaterally w diaphragmatic breathing to engage deep abdominal lymphatics. Stationary J strokes to inguinal LN, then proximal to distal dynamic J strokes to thigh, "bottleneck" region, leg and foot. Finally 3 retrograde sweeps back to terminus. Daily Skin care w/ low ph lotion matching skin ph to limit infection risk and improve tissue flexibility and excursion Daily, BLE lymphatic pumping therex- seated or supine Daily (23/7) multilayer compression bandages below the knee to reduce limb volume, one limb at a time (One each 8 cm, 10 cm and 12 cm wide x 5 meters long short stretch compression wrap applied circumferentially  using gradient layering techniques over single layer of cotton stockinett an Rosidal foam- toes to popliteal fossa  Self-management Phase CDT includes: Appropriate daytime compression garments and HOS devices full time daily. Multiples needed  for hygiene. Replace q 3-6 months Elvarex, flat knit, ccl 225-32 mmHg) knee highs w open toe, t heel, oblique top edge, 2.5 cm wide SB on top, tricot patch at anterior ankle sewn on all 4 sides. To be work full time waking hours. Jobst RELAX knee high HOS device  Custom-made gradient compression garments and HOS devices are medically necessary in this case because  they are uniquely sized and shaped to fit the exact dimensions of the affected extremities with deformities, and to provide accurate and consistent gradient compression and containment, essential to optimally managing this patient's symptoms of chronic, progressive lymphedema. Multiple custom compression garments are needed for optimal hygiene to limit infection risk. Custom compression garments should be replaced q 3-6 months When worn consistently for optimal lipo-lymphedema self-management over time.   ASSESSMENT:  CLINICAL IMPRESSION:  OT pulled off Initial R knee high compression stocking in effort to check on form fit of garment and containment. Garment is slightly dirty and feels a bit lose,   but  I suspect garment has not been washed since initially fitted. That will bring it back into a snugger fit and shape. Garment appears to fit quite well length-wise when fabric is properly distributed from the heel upwards. OT reviewed techniques for donning and also taught Pt how to use "It Stays" fabric adhesive. Pt will continue to call the DME vendor to report problem and need for remake. They do not answer their phone or return calls in a timely way, so OT emailed re the problem and requested addition of inside silicone band in combination with top band on the remakes. Pt will call to report any  additional garment concerns. Going forward she transitions today from the Intensive Phase to the self-management phase of CDT. She agrees with plan to schedule a 6 month follow up to check progression and assist with garment replacements PRN.  OBJECTIVE IMPAIRMENTS: low vision, Abnormal gait, decreased activity tolerance, decreased balance, decreased endurance, decreased knowledge of condition, decreased knowledge of use of DME, decreased mobility, decreased ROM, increased edema, impaired flexibility, postural dysfunction, obesity, and pain.   ACTIVITY LIMITATIONS: functional mobility and ambulation (carrying, lifting, bending, sitting, standing, squatting, sleeping, stairs, transfers, bed mobility, continence, Basic and instrumental ADLs (bathing, dressing, hygiene/grooming, Productive activities ( caring for others) and leisure pursuits, body image, and social participation  PERSONAL FACTORS:  multiple co morbidities, length of time with progressive condition, are also affecting patient's functional outcome.   REHAB POTENTIAL: Fair    EVALUATION COMPLEXITY: Moderate   GOALS: Goals reviewed with patient? Yes  SHORT TERM GOALS: Target date: 4th OT Rx visit   Pt will demonstrate understanding of lymphedema precautions and prevention strategies with modified independence using a printed reference to identify at least 5 precautions and discussing how s/he may implement them into daily life to reduce risk of progression with extra time (Modified Independence). Baseline:Max A Goal status: 12/07/22 GOAL MET  2.  Pt will be able to apply multilayer, thigh length, gradient, compression wraps to one leg at a time with modified assistance (extra time) to decrease limb volume, to limit infection risk, and to limit lymphedema progression.  Baseline: Dependent Goal status: 11/16/22 PROGRESSING 11/26/22 GOAL MET  LONG TERM GOALS: Target date: 02/01/23  Given this patient's Intake score of 63/100% on the  functional outcomes FOTO tool, patient will experience an increase in function of 3 points to improve basic and instrumental ADLs performance, including lymphedema self-care.  Baseline: ONGOING   Goal status:12/07/22 GOAL ONGOING,  01/20/23 PROGRESSING 03/03/23 PROGRESSING; 04/12/23 PROGRESSING. 05/24/23 GOAL MET w/ score of 74%, revealing 11% increase in function since commencing OT.  2.  Given this patient's Intake score of 50.0 % on the Lymphedema Life Impact Scale (LLIS), patient will experience a reduction of at least 5 points in her perceived level of functional impairment resulting from lymphedema to improve functional performance and quality  of life (QOL). Baseline: 50% Goal status: 12/07/22 GOAL ONGOING; 01/20/23 PROGRESSING,  03/03/23 PROGRESSING 04/12/23 PROGRESSING. 05/24/23 GOAL MET w/ score of 36.76%, revealing 13.24% reduction % in LE life impact since commencing OT.  3.  During Intensive phase CDT Pt will achieve at least 85% compliance with all lymphedema self-care home program components, including daily skin care, compression wraps and /or garments, simple self MLD and lymphatic pumping therex, and elevation when sitting to habituate LE self care protocol  into ADLs for optimal LE self-management over time. Baseline: Dependent Goal status: 12/08/22 ONGOING. Pt not changing wraps daily as directed, but will work on increasing elevation anytime she is seated for next progress report. Pt will also increase frequency of re-wrapping her leg to ensure optimal volume reduction. GOAL MET 01/20/23  4.  Pt will achieve at least a 10% volume reduction below the knees to return limb to typical size and shape, to limit infection risk and LE progression, to decrease pain, to improve function. Baseline: dependent Goal status:  12/07/22 PROGRESSING 01/18/23 : Leg reduction= 8.67% since initial. Thigh  is Increased 10% since initial, And full LE is decreased by 0.018% since 11/11/22;   01/20/23 GOAL MET for R LEG w  9.96 % reduction  03/01/23 BLE comparative limb volumetrics reveal a  significant 15.15% increase in limb volume since last measured on 01/18/23. The LLE , the current Rx limb, is increased by 1.66% since initially measured in 4/24.   04/07/23 PROGRESSING with 7.9% reduction in the L leg since 03/01/23. See volumetric chart for additional details.  5.  Pt will obtain proper compression garments/compression braces and be modified independent with donning/doffing to optimize/stabilize with fluid reduction. Baseline:  Goal status: 12/07/22 PROGRESSING. 01/20/23 PROGRESSING.02/08/23 GOAL MET for RLE, 04/12/23 PROGRESSING for LLE. 05/24/23 GOAL MET for LLE.  PLAN:  OT FREQUENCY: Return in 1 week for fitting follow up, to review self care and to measures final BLE volumetrics.  )T DURATION: Ongoing and PRN for self-management support  PLANNED INTERVENTIONS: Therapeutic exercises, Therapeutic activity, Patient/Family education, Self Care, Manual lymph drainage, Compression bandaging, scar mobilization, Manual therapy, and skin care throughout MLD  PLAN FOR NEXT SESSION:  Complete final BLE comparative limb volumetrics. Complete final assessment of L compression garments/ devices Complete Intensive Phase LE self-care edu for home program and transition to Self-management Phase of CDT  Loel Dubonnet, MS, OTR/L, CLT-LANA 06/02/23 1:23 PM

## 2023-06-04 ENCOUNTER — Ambulatory Visit: Payer: Self-pay

## 2023-06-04 DIAGNOSIS — M1711 Unilateral primary osteoarthritis, right knee: Secondary | ICD-10-CM | POA: Diagnosis not present

## 2023-06-04 NOTE — Patient Outreach (Signed)
  Care Coordination   06/04/2023 Name: Emily Wood MRN: [id number] DOB: 05/28/1951   Care Coordination Outreach Attempts:  An unsuccessful telephone outreach was attempted for a scheduled appointment today.  Follow Up Plan:  Additional outreach attempts will be made to offer the patient care coordination information and services.   Encounter Outcome:  No Answer   Care Coordination Interventions:  No, not indicated    Lysle Morales, BSW Social Worker [phone]

## 2023-06-07 ENCOUNTER — Encounter: Payer: Medicare Other | Admitting: Occupational Therapy

## 2023-06-09 ENCOUNTER — Encounter: Payer: Medicare Other | Admitting: Occupational Therapy

## 2023-06-11 ENCOUNTER — Telehealth (HOSPITAL_BASED_OUTPATIENT_CLINIC_OR_DEPARTMENT_OTHER): Payer: Self-pay | Admitting: Licensed Clinical Social Worker

## 2023-06-11 ENCOUNTER — Ambulatory Visit: Payer: Medicare Other | Attending: Cardiovascular Disease | Admitting: Pharmacist

## 2023-06-11 VITALS — BP 151/77 | HR 64

## 2023-06-11 DIAGNOSIS — E0800 Diabetes mellitus due to underlying condition with hyperosmolarity without nonketotic hyperglycemic-hyperosmolar coma (NKHHC): Secondary | ICD-10-CM | POA: Diagnosis not present

## 2023-06-11 DIAGNOSIS — I10 Essential (primary) hypertension: Secondary | ICD-10-CM | POA: Diagnosis not present

## 2023-06-11 NOTE — Patient Instructions (Addendum)
It was nice meeting you today  We would like your blood pressure to be less than 130/80  Please continue your:  Amlodipine 10mg  daily Carvedilol 12.5mg  twice a day Clonidine 0.2mg  twice a day Irbesartan 300mg  daily Isosorbide 30mg  daily Torsemide 10mg  once daily as needed  Try to watch how much salt you are eating  Try to remember to take your clonidine three times a day  We will see you back in January. Try to remember to pick up your blood pressure cuff from [facility]  Laural Golden, PharmD, [place], [place], CPP [address] [place], [place], [address] Phone: [phone], Fax: [phone]

## 2023-06-11 NOTE — Progress Notes (Unsigned)
Patient ID: Emily Wood                 DOB: Sep 07, 1950                      MRN: [id number]     HPI: Emily Wood is a 72 y.o. female referred by Dr. Servando Salina to HTN clinic. PMH is significant for CAD, pulmonary HTN, CKD, HLD, HTN, and T2DM.  Truck driver  Feeling well  Possibly depressed  Too much sweets  No SOB,   Current HTN meds:  Amlodipine 10mg  daily Carvedilol 12.5mg  BID Clonidine 0.2mg  BID Irbesartan 300mg  daily Isosorbide 30mg  daily Torsemide 10mg  PRN daily  Previously tried:  BP goal:   Family History:   Social History:   Diet:   Exercise:   Home BP readings:   Wt Readings from Last 3 Encounters:  05/14/23 214 lb (97.1 kg)  05/06/23 215 lb 6.4 oz (97.7 kg)  10/26/22 193 lb 6.4 oz (87.7 kg)   BP Readings from Last 3 Encounters:  05/14/23 (!) 168/90  05/06/23 (!) 150/80  10/26/22 (!) 144/88   Pulse Readings from Last 3 Encounters:  05/14/23 (!) 55  05/06/23 65  10/26/22 64    Renal function: CrCl cannot be calculated (Patient's most recent lab result is older than the maximum 21 days allowed.).  Past Medical History:  Diagnosis Date   Arthritis    Diabetes mellitus without complication (HCC)    Family history of adverse reaction to anesthesia    PONV   Graves disease 04/14/2020   Hypertension    Lymphedema    Mixed hyperlipidemia 02/20/2021    Current Outpatient Medications on File Prior to Visit  Medication Sig Dispense Refill   Accu-Chek Softclix Lancets lancets CHECK BLOOD SUGAR ONCE DAILY 100 each 2   Alpha-Lipoic Acid 300 MG CAPS Take 600 mg by mouth daily.     amLODipine (NORVASC) 10 MG tablet Take 1 tablet (10 mg total) by mouth daily. 90 tablet 3   Ascorbic Acid (VITAMIN C) 1000 MG tablet Take 1,000 mg by mouth daily.     aspirin 81 MG tablet Take 81 mg by mouth daily.     atorvastatin (LIPITOR) 40 MG tablet TAKE 1 TABLET BY MOUTH DAILY 100 tablet 2   Biotin w/ Vitamins C & E (HAIR/SKIN/NAILS PO) Take 6,000 mcg by  mouth daily.     Blood Pressure Monitor MISC Use to check blood pressure daily.  Please include a large cuff     carvedilol (COREG) 12.5 MG tablet TAKE 1 TABLET BY MOUTH TWICE  DAILY 200 tablet 2   Cholecalciferol (VITAMIN D3) 125 MCG (5000 UT) CAPS Take 1 capsule (5,000 Units total) by mouth daily.     cloNIDine (CATAPRES) 0.2 MG tablet Take 1 tablet (0.2 mg total) by mouth 3 (three) times daily. (Patient taking differently: Take 0.2 mg by mouth 2 (two) times daily.) 270 tablet 0   Cobalamin Combinations (B-12) [phone] MCG SUBL Place 5,000 mcg under the tongue daily.     diclofenac Sodium (VOLTAREN) 1 % GEL Apply 4 g topically 4 (four) times daily. (Patient taking differently: Apply 4 g topically daily as needed.) 100 g 0   ferrous sulfate 324 MG TBEC Take 324 mg by mouth daily.     gabapentin (NEURONTIN) 400 MG capsule Take 2 capsules (800 mg total) by mouth 3 (three) times daily. (Patient taking differently: Take 800 mg by mouth 2 (two) times daily.) 540  capsule 0   GEMTESA 75 MG TABS Take 75 mg by mouth daily. (Patient not taking: Reported on 05/28/2023)     glucose blood (ACCU-CHEK GUIDE) test strip CHECK BLOOD SUGAR ONCE DAILY 100 strip 2   irbesartan (AVAPRO) 300 MG tablet TAKE 1 TABLET BY MOUTH DAILY 100 tablet 2   isosorbide mononitrate (IMDUR) 30 MG 24 hr tablet TAKE 1 TABLET BY MOUTH DAILY 100 tablet 0   methimazole (TAPAZOLE) 5 MG tablet Take 2.5 mg by mouth daily.     Multiple Vitamin (MULTIVITAMIN ADULT PO) Take 2 tablets by mouth daily.     nystatin cream (MYCOSTATIN) Apply 1 application topically 2 (two) times daily. (Patient taking differently: Apply 1 application  topically 2 (two) times daily as needed for dry skin.) 30 g 1   oxybutynin (DITROPAN-XL) 10 MG 24 hr tablet Take 10 mg by mouth at bedtime.     OZEMPIC, 0.25 OR 0.5 MG/DOSE, 2 MG/3ML SOPN Inject 0.5 mg into the skin once a week.     potassium chloride SA (KLOR-CON M) 20 MEQ tablet Take 1 tablet (20 mEq total) by mouth  daily. 90 tablet 0   prednisoLONE acetate (PRED FORTE) 1 % ophthalmic suspension Place 1 drop into both eyes 4 (four) times daily. 5 mL 0   torsemide (DEMADEX) 10 MG tablet TAKE 1 TABLET (10 MG TOTAL) BY MOUTH DAILY. USE AS NEEDED FOR LOWER EXTREMITY SWELLING 90 tablet 1   Turmeric 500 MG CAPS Take 500 mg by mouth daily.     No current facility-administered medications on file prior to visit.    No Known Allergies   Assessment/Plan:  1. Hypertension -

## 2023-06-11 NOTE — Telephone Encounter (Signed)
H&V Care Navigation CSW Progress Note  Clinical Social Worker contacted patient by phone to f/u after pharmacy appt this morning. Pt shared issues with transportation with pharmacy team. Note that pt has been actively speaking with [facility] BSW and Care Guide for resources- unclear if she has used any of those or the information provided by PCP PharmD.   LCSW was unable to reach pt at [phone], left voicemail requesting return call. Will re-attempt again as able.  Patient is participating in a Managed Medicaid Plan:  No, [employer]  SDOH Screenings   Food Insecurity: No Food Insecurity (05/05/2023)  Housing: Medium Risk (04/13/2023)  Transportation Needs: Unmet Transportation Needs (05/05/2023)  Utilities: At Risk (04/13/2023)  Alcohol Screen: Low Risk  (05/05/2023)  Depression (PHQ2-9): Low Risk  (06/24/2022)  Financial Resource Strain: Medium Risk (05/05/2023)  Physical Activity: Unknown (05/05/2023)  Social Connections: Moderately Integrated (05/05/2023)  Stress: No Stress Concern Present (05/05/2023)  Tobacco Use: Low Risk  (05/24/2023)    Octavio Graves, MSW, LCSW Clinical Social Worker II [facility] Heart/Vascular Care Navigation  [phone]- work cell phone (preferred) [phone]- desk phone

## 2023-06-13 ENCOUNTER — Encounter: Payer: Self-pay | Admitting: Pharmacist

## 2023-06-14 ENCOUNTER — Telehealth: Payer: Self-pay | Admitting: Licensed Clinical Social Worker

## 2023-06-14 ENCOUNTER — Encounter: Payer: Medicare Other | Admitting: Occupational Therapy

## 2023-06-14 NOTE — Progress Notes (Unsigned)
Heart and Vascular Care Navigation  06/14/2023  Emily Wood [birth year] [id number]  Reason for Referral: transportation issues Patient is participating in a Managed Medicaid Plan: No, [employer] only  Engaged with patient by telephone for initial visit for Heart and Vascular Care Coordination.                                                                                                   Assessment:                     LCSW was able to reach pt at [phone]. Introduced self, role, reason for call. Pt confirmed home address, PCP, and that she resides with her husband. She is retired, wishes she could still work at times but currently only receives Tree surgeon. Her husband is still employed hoping to retire in the next few years. 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 She does not receive any food assistance but shares she and her husband have considered a pantry near their home in [place].   She has ongoing challenges with her eyes, currently is seeing [employer] [facility] for injections. She is able to drive okay during the day but does still have challenges. Used to have coverage through her [employer] but currently does not. She has been followed by [facility] team (formerly [facility]) and provided many resources by their team. She is aware that her options for ride assistance are to use [employer] but will need to schedule any out of county appts on days provided by system. She is not interested in only scheduling on those days which limits assistance.  We discussed assistance including but not limited to sending her information about local food resources, SNAP card for [employer], transportation information, L[employer] application for utility assistance starting in December and my card. Pt agreeable to above being sent to her. She is also  encouraged to speak with DSS regarding any services available to those with vision issues. Also encouraged to speak with [employer] to ensure in the best Medicare plan for her needs, she can reach them both telephonically.  I finally inquired if pt has ever gone to get the BP cuff from PCP office as she had told her pharmacy team she would pick that up from office. Pt shares she still hasn't, encouraged her to do so/speak with them about mailing it if possible and to ensure it is still there before she goes over.                    HRT/VAS Care Coordination     Patients Home Cardiology Office [facility]   Outpatient Care Team Social Worker   Social Worker Name: Octavio Graves, [place], [phone]   Living arrangements for the past 2 months Single Family Home   Lives with: Spouse   Patient Current Insurance Coverage Managed Medicare   Patient Has Concern With Paying Medical Bills No   Does Patient  Have Prescription Coverage? Yes   Home Assistive Devices/Equipment Cane (specify quad or straight)   Current home services DME  Cane at home.       Social History:                                                                             SDOH Screenings   Food Insecurity: Food Insecurity Present (06/14/2023)  Housing: Low Risk  (06/14/2023)  Recent Concern: Housing - Medium Risk (04/13/2023)  Transportation Needs: Unmet Transportation Needs (06/14/2023)  Utilities: Not At Risk (06/14/2023)  Recent Concern: Utilities - At Risk (04/13/2023)  Alcohol Screen: Low Risk  (05/05/2023)  Depression (PHQ2-9): Low Risk  (06/24/2022)  Financial Resource Strain: Medium Risk (06/14/2023)  Physical Activity: Unknown (05/05/2023)  Social Connections: Moderately Integrated (05/05/2023)  Stress: No Stress Concern Present (05/05/2023)  Tobacco Use: Low Risk  (06/13/2023)  Health Literacy: Inadequate Health Literacy (06/14/2023)    SDOH Interventions: Financial Resources:  Financial Strain  Interventions: Other (Comment) (pt spouse still works, pt receives Tree surgeon income - shares they feel limited with bills etc- mailed transportation resources, [employer] card and IT sales professional) DSS for financial assistance  Food Insecurity:  Food Insecurity Interventions: Other (Comment) (will send SNAP referral card for [employer]- pt aware of local pantry near home)  Housing Insecurity:  Housing Interventions: Intervention Not Indicated  Transportation:   Transportation Interventions: Patient Resources (Friends/Family), Other (Comment) (mailed transportation resources; pt aware of [employer])    Other Care Navigation Interventions:     Provided Pharmacy assistance resources  Pt currently able to obtain and afford her medications   Follow-up plan:   LCSW has mailed pt the following: my card, Pension scheme manager w/ home delivery information, SNAP card from [employer]. [employer]/[place] [employer] information, Food [employer], [employer] application, and transportation list along with [employer]. Will f/u to ensure pt has received these.

## 2023-06-22 ENCOUNTER — Encounter: Payer: Medicare Other | Admitting: Occupational Therapy

## 2023-06-22 DIAGNOSIS — H209 Unspecified iridocyclitis: Secondary | ICD-10-CM | POA: Diagnosis not present

## 2023-06-22 DIAGNOSIS — H35353 Cystoid macular degeneration, bilateral: Secondary | ICD-10-CM | POA: Diagnosis not present

## 2023-06-23 ENCOUNTER — Other Ambulatory Visit: Payer: Self-pay | Admitting: Family Medicine

## 2023-06-23 ENCOUNTER — Encounter: Payer: Medicare Other | Admitting: Occupational Therapy

## 2023-06-23 DIAGNOSIS — I10 Essential (primary) hypertension: Secondary | ICD-10-CM

## 2023-06-23 DIAGNOSIS — R600 Localized edema: Secondary | ICD-10-CM

## 2023-06-23 DIAGNOSIS — M5432 Sciatica, left side: Secondary | ICD-10-CM

## 2023-06-24 ENCOUNTER — Encounter: Payer: Medicare Other | Admitting: Occupational Therapy

## 2023-06-24 ENCOUNTER — Ambulatory Visit: Payer: Medicare Other | Admitting: Podiatry

## 2023-06-25 ENCOUNTER — Telehealth (HOSPITAL_BASED_OUTPATIENT_CLINIC_OR_DEPARTMENT_OTHER): Payer: Self-pay | Admitting: Licensed Clinical Social Worker

## 2023-06-25 NOTE — Telephone Encounter (Signed)
H&V Care Navigation CSW Progress Note  Clinical Social Worker contacted patient by phone to f/u on community resources for transportation, food etc sent to pt. Was able to reach her at [phone] today- confirmed she received them, hasn't sat down and looked at them at this time. Encouraged her to let us know when upcoming appt is closer in January if any assistance needed with ride etc.   Pt agreeable, no additional questions/concerns when asked at this time.  Patient is participating in a Managed Medicaid Plan:  No, [employer] only  SDOH Screenings   Food Insecurity: Food Insecurity Present (06/14/2023)  Housing: Low Risk  (06/14/2023)  Recent Concern: Housing - Medium Risk (04/13/2023)  Transportation Needs: Unmet Transportation Needs (06/14/2023)  Utilities: Not At Risk (06/14/2023)  Recent Concern: Utilities - At Risk (04/13/2023)  Alcohol Screen: Low Risk  (05/05/2023)  Depression (PHQ2-9): Low Risk  (06/24/2022)  Financial Resource Strain: Medium Risk (06/14/2023)  Physical Activity: Unknown (05/05/2023)  Social Connections: Moderately Integrated (05/05/2023)  Stress: No Stress Concern Present (05/05/2023)  Tobacco Use: Low Risk  (06/13/2023)  Health Literacy: Inadequate Health Literacy (06/14/2023)    Octavio Graves, MSW, LCSW Clinical Social Worker II [facility] Heart/Vascular Care Navigation  [phone]- work cell phone (preferred) [phone]- desk phone

## 2023-06-28 ENCOUNTER — Encounter: Payer: Medicare Other | Admitting: Occupational Therapy

## 2023-06-29 ENCOUNTER — Ambulatory Visit: Payer: Medicare Other

## 2023-06-29 VITALS — Ht 65.0 in | Wt 214.0 lb

## 2023-06-29 DIAGNOSIS — Z0000 Encounter for general adult medical examination without abnormal findings: Secondary | ICD-10-CM | POA: Diagnosis not present

## 2023-06-29 NOTE — Progress Notes (Signed)
Subjective:   Emily Wood is a 72 y.o. female who presents for Medicare Annual (Subsequent) preventive examination.  Visit Complete: Virtual I connected with  [name] on 06/29/23 by a audio enabled telemedicine application and verified that I am speaking with the correct person using two identifiers.  Patient Location: Home  Provider Location: Home Office  I discussed the limitations of evaluation and management by telemedicine. The patient expressed understanding and agreed to proceed.  Vital Signs: Because this visit was a virtual/telehealth visit, some criteria may be missing or patient reported. Any vitals not documented were not able to be obtained and vitals that have been documented are patient reported.    Cardiac Risk Factors include: advanced age (>63men, >60 women);diabetes mellitus;hypertension     Objective:    Today's Vitals   06/29/23 1053  Weight: 214 lb (97.1 kg)  Height: 5\' 5"  (1.651 m)   Body mass index is 35.61 kg/m.     06/29/2023   11:07 AM 11/03/2022    8:34 AM 09/01/2022    8:18 AM 05/21/2022    5:01 PM 04/28/2022    9:33 AM 04/14/2022    6:31 AM 12/10/2021    8:55 PM  Advanced Directives  Does Patient Have a Medical Advance Directive? No No No No No No No  Would patient like information on creating a medical advance directive? No - Patient declined  No - Patient declined  No - Patient declined Yes (MAU/Ambulatory/Procedural Areas - Information given) Yes (MAU/Ambulatory/Procedural Areas - Information given)    Current Medications (verified) Outpatient Encounter Medications as of 06/29/2023  Medication Sig   Accu-Chek Softclix Lancets lancets CHECK BLOOD SUGAR ONCE DAILY   Alpha-Lipoic Acid 300 MG CAPS Take 600 mg by mouth daily.   amLODipine (NORVASC) 10 MG tablet Take 1 tablet (10 mg total) by mouth daily.   Ascorbic Acid (VITAMIN C) 1000 MG tablet Take 1,000 mg by mouth daily.   aspirin 81 MG tablet Take 81 mg by mouth  daily.   atorvastatin (LIPITOR) 40 MG tablet TAKE 1 TABLET BY MOUTH DAILY   Biotin w/ Vitamins C & E (HAIR/SKIN/NAILS PO) Take 6,000 mcg by mouth daily.   Blood Pressure Monitor MISC Use to check blood pressure daily.  Please include a large cuff   carvedilol (COREG) 12.5 MG tablet TAKE 1 TABLET BY MOUTH TWICE  DAILY   Cholecalciferol (VITAMIN D3) 125 MCG (5000 UT) CAPS Take 1 capsule (5,000 Units total) by mouth daily.   cloNIDine (CATAPRES) 0.2 MG tablet Take 1 tablet (0.2 mg total) by mouth 3 (three) times daily.   Cobalamin Combinations (B-12) [phone] MCG SUBL Place 5,000 mcg under the tongue daily.   diclofenac Sodium (VOLTAREN) 1 % GEL Apply 4 g topically 4 (four) times daily. (Patient taking differently: Apply 4 g topically daily as needed.)   ferrous sulfate 324 MG TBEC Take 324 mg by mouth daily.   gabapentin (NEURONTIN) 400 MG capsule Take 2 capsules (800 mg total) by mouth 2 (two) times daily.   GEMTESA 75 MG TABS Take 75 mg by mouth daily. (Patient not taking: Reported on 05/28/2023)   glucose blood (ACCU-CHEK GUIDE) test strip CHECK BLOOD SUGAR ONCE DAILY   irbesartan (AVAPRO) 300 MG tablet TAKE 1 TABLET BY MOUTH DAILY   isosorbide mononitrate (IMDUR) 30 MG 24 hr tablet TAKE 1 TABLET BY MOUTH DAILY   methimazole (TAPAZOLE) 5 MG tablet Take 2.5 mg by mouth daily.   Multiple Vitamin (MULTIVITAMIN ADULT PO)  Take 2 tablets by mouth daily.   nystatin cream (MYCOSTATIN) Apply 1 application topically 2 (two) times daily. (Patient taking differently: Apply 1 application  topically 2 (two) times daily as needed for dry skin.)   oxybutynin (DITROPAN-XL) 10 MG 24 hr tablet Take 10 mg by mouth at bedtime.   OZEMPIC, 0.25 OR 0.5 MG/DOSE, 2 MG/3ML SOPN Inject 0.5 mg into the skin once a week.   potassium chloride SA (KLOR-CON M) 20 MEQ tablet TAKE 1 TABLET BY MOUTH DAILY   prednisoLONE acetate (PRED FORTE) 1 % ophthalmic suspension Place 1 drop into both eyes 4 (four) times daily.   torsemide  (DEMADEX) 10 MG tablet TAKE 1 TABLET (10 MG TOTAL) BY MOUTH DAILY. USE AS NEEDED FOR LOWER EXTREMITY SWELLING   Turmeric 500 MG CAPS Take 500 mg by mouth daily.   No facility-administered encounter medications on file as of 06/29/2023.    Allergies (verified) Patient has no known allergies.   History: Past Medical History:  Diagnosis Date   Arthritis    Diabetes mellitus without complication (HCC)    Family history of adverse reaction to anesthesia    PONV   Graves disease 04/14/2020   Hypertension    Lymphedema    Mixed hyperlipidemia 02/20/2021   Past Surgical History:  Procedure Laterality Date   CATARACT EXTRACTION W/PHACO Right 04/14/2022   Procedure: CATARACT EXTRACTION PHACO AND INTRAOCULAR LENS PLACEMENT (IOC) RIGHT DIABETIC 10.93 00:53.0;  Surgeon: Galen Manila, MD;  Location: [facility];  Service: Ophthalmology;  Laterality: Right;   CATARACT EXTRACTION W/PHACO Left 04/28/2022   Procedure: CATARACT EXTRACTION PHACO AND INTRAOCULAR LENS PLACEMENT (IOC) LEFT DIABETIC 6.05 00:45.7;  Surgeon: Galen Manila, MD;  Location: [facility];  Service: Ophthalmology;  Laterality: Left;  Diabetic   CHOLECYSTECTOMY     HERNIA REPAIR     OTHER SURGICAL HISTORY     scar tissue removal from bowels   PARTIAL HYSTERECTOMY     Still has ovaries   REVERSE SHOULDER ARTHROPLASTY Right 09/11/2022   Procedure: REVERSE SHOULDER ARTHROPLASTY;  Surgeon: Yolonda Kida, MD;  Location: [facility];  Service: Orthopedics;  Laterality: Right;  120   UTERINE FIBROID SURGERY     Family History  Problem Relation Age of Onset   Hyperlipidemia Mother    Cancer Sister    Hyperlipidemia Maternal Aunt    Diabetes Maternal Aunt    Breast cancer Cousin    Diabetes Maternal Uncle    Social History   Socioeconomic History   Marital status: Married    Spouse name: Caryn Bee   Number of children: 0   Years of education: 16   Highest education level: Associate degree:  occupational, Scientist, product/process development, or vocational program  Occupational History   Occupation: customer service  Tobacco Use   Smoking status: Never   Smokeless tobacco: Never  Vaping Use   Vaping status: Never Used  Substance and Sexual Activity   Alcohol use: No    Alcohol/week: 0.0 standard drinks of alcohol    Comment: rarely   Drug use: No   Sexual activity: Not Currently    Birth control/protection: Post-menopausal  Other Topics Concern   Not on file  Social History Narrative   Lives with husband   Caffeine use: 16oz coffee per day   Social Determinants of Health   Financial Resource Strain: Low Risk  (06/29/2023)   Overall Financial Resource Strain (CARDIA)    Difficulty of Paying Living Expenses: Not hard at all  Recent Concern: Financial  Resource Strain - Medium Risk (06/14/2023)   Overall Financial Resource Strain (CARDIA)    Difficulty of Paying Living Expenses: Somewhat hard  Food Insecurity: No Food Insecurity (06/29/2023)   Hunger Vital Sign    Worried About Running Out of Food in the Last Year: Never true    Ran Out of Food in the Last Year: Never true  Recent Concern: Food Insecurity - Food Insecurity Present (06/14/2023)   Hunger Vital Sign    Worried About Running Out of Food in the Last Year: Sometimes true    Ran Out of Food in the Last Year: Never true  Transportation Needs: No Transportation Needs (06/29/2023)   PRAPARE - Administrator, Civil Service (Medical): No    Lack of Transportation (Non-Medical): No  Recent Concern: Transportation Needs - Unmet Transportation Needs (06/14/2023)   PRAPARE - Transportation    Lack of Transportation (Medical): Yes    Lack of Transportation (Non-Medical): Yes  Physical Activity: Inactive (06/29/2023)   Exercise Vital Sign    Days of Exercise per Week: 0 days    Minutes of Exercise per Session: 0 min  Stress: No Stress Concern Present (06/29/2023)   [employer] of Occupational Health - Occupational  Stress Questionnaire    Feeling of Stress : Not at all  Social Connections: Moderately Isolated (06/29/2023)   Social Connection and Isolation Panel [NHANES]    Frequency of Communication with Friends and Family: More than three times a week    Frequency of Social Gatherings with Friends and Family: More than three times a week    Attends Religious Services: Never    Database administrator or Organizations: No    Attends Engineer, structural: Never    Marital Status: Married    Tobacco Counseling Counseling given: Not Answered   Clinical Intake:  Pre-visit preparation completed: Yes  Pain : No/denies pain     BMI - recorded: 35.61 Nutritional Status: BMI > 30  Obese Nutritional Risks: None Diabetes: Yes CBG done?: No Did pt. bring in CBG monitor from home?: No  How often do you need to have someone help you when you read instructions, pamphlets, or other written materials from your doctor or pharmacy?: 3 - Sometimes (Husband Assist)  Interpreter Needed?: No  Information entered by :: Theresa Mulligan LPN   Activities of Daily Living    06/29/2023   11:03 AM 09/01/2022    8:20 AM  In your present state of health, do you have any difficulty performing the following activities:  Hearing? 0   Vision? 1   Comment Followed by Medical Attention   Difficulty concentrating or making decisions? 0   Walking or climbing stairs? 1   Comment Uses a Cane   Dressing or bathing? 0   Doing errands, shopping? 0 0  Preparing Food and eating ? N   Using the Toilet? N   In the past six months, have you accidently leaked urine? Y   Comment Wear pads and breifs. Followed by Urologist   Do you have problems with loss of bowel control? N   Managing your Medications? N   Managing your Finances? N   Housekeeping or managing your Housekeeping? N     Patient Care Team: Copland, Gwenlyn Found, MD as PCP - General (Family Medicine) Thomasene Ripple, DO as PCP - Cardiology  (Cardiology) Dalbert Garnet as Social Worker  Indicate any recent Medical Services you may have received from other than [facility] providers in  the past year (date may be approximate).     Assessment:   This is a routine wellness examination for Emily Wood.  Hearing/Vision screen Hearing Screening - Comments:: Denies hearing difficulties   Vision Screening - Comments:: Wears rx glasses - up to date with routine eye exams with  [facility]   Goals Addressed               This Visit's Progress     Increase physical activity (pt-stated)        Lose weight.       Depression Screen    06/29/2023   11:02 AM 06/24/2022    8:27 AM 06/16/2022    4:40 PM 06/01/2022    9:38 AM 10/01/2021    1:46 PM 06/06/2021    8:32 AM 02/13/2021    1:34 PM  PHQ 2/9 Scores  PHQ - 2 Score 0 0 0 0 0 1 0  PHQ- 9 Score     0      Fall Risk    06/29/2023   11:06 AM 06/24/2022    8:27 AM 06/16/2022    4:40 PM 06/01/2022    9:38 AM 10/01/2021    1:46 PM  Fall Risk   Falls in the past year? 0 1 1 0 0  Number falls in past yr: 0 0 0 0 0  Injury with Fall? 0 1 1 0 0  Risk for fall due to : No Fall Risks History of fall(s) Impaired balance/gait  No Fall Risks  Risk for fall due to: Comment   Pt fell off her chair    Follow up Falls prevention discussed Falls evaluation completed Falls evaluation completed Falls evaluation completed     MEDICARE RISK AT HOME: Medicare Risk at Home Any stairs in or around the home?: Yes If so, are there any without handrails?: No Home free of loose throw rugs in walkways, pet beds, electrical cords, etc?: Yes Adequate lighting in your home to reduce risk of falls?: Yes Life alert?: No Use of a cane, walker or w/c?: Yes Grab bars in the bathroom?: Yes Shower chair or bench in shower?: Yes Elevated toilet seat or a handicapped toilet?: No  TIMED UP AND GO:  Was the test performed?  No    Cognitive Function:        06/29/2023   11:07 AM  6CIT Screen   What Year? 0 points  What month? 0 points  What time? 0 points  Count back from 20 0 points  Months in reverse 0 points  Repeat phrase 0 points  Total Score 0 points    Immunizations Immunization History  Administered Date(s) Administered   Fluad Quad(high Dose 65+) 04/29/2019, 04/17/2020, 06/06/2021, 06/01/2022   Fluad Trivalent(High Dose 65+) 05/06/2023   Influenza, High Dose Seasonal PF 06/17/2017, 05/26/2018   Influenza,inj,Quad PF,6+ Mos 05/08/2015   Influenza-Unspecified 04/20/2015, 07/17/2016   PFIZER Comirnaty(Gray Top)Covid-19 Tri-Sucrose Vaccine 02/13/2021   PFIZER(Purple Top)SARS-COV-2 Vaccination 10/06/2019, 11/01/2019, 06/07/2020   Pfizer Covid-19 Vaccine Bivalent Booster 80yrs & up 06/06/2021   Pneumococcal Conjugate-13 11/25/2017   Pneumococcal Polysaccharide-23 05/08/2015, 05/20/2020   Tdap 12/31/2016    TDAP status: Up to date  Flu Vaccine status: Up to date  Pneumococcal vaccine status: Up to date  Covid-19 vaccine status: Declined, Education has been provided regarding the importance of this vaccine but patient still declined. Advised may receive this vaccine at local pharmacy or Health Dept.or vaccine clinic. Aware to provide a copy of the vaccination  record if obtained from local pharmacy or Health Dept. Verbalized acceptance and understanding.  Qualifies for Shingles Vaccine? Yes   Zostavax completed No   Shingrix Completed?: No.    Education has been provided regarding the importance of this vaccine. Patient has been advised to call insurance company to determine out of pocket expense if they have not yet received this vaccine. Advised may also receive vaccine at local pharmacy or Health Dept. Verbalized acceptance and understanding.  Screening Tests Health Maintenance  Topic Date Due   Zoster Vaccines- Shingrix (1 of 2) Never done   COVID-19 Vaccine (6 - 2023-24 season) 03/21/2023   Diabetic kidney evaluation - Urine ACR  08/25/2023   FOOT EXAM   10/26/2023   HEMOGLOBIN A1C  11/04/2023   Diabetic kidney evaluation - eGFR measurement  05/05/2024   OPHTHALMOLOGY EXAM  05/10/2024   Medicare Annual Wellness (AWV)  06/28/2024   MAMMOGRAM  09/02/2024   Fecal DNA (Cologuard)  11/12/2025   DTaP/Tdap/Td (2 - Td or Tdap) 01/01/2027   Pneumonia Vaccine 42+ Years old  Completed   INFLUENZA VACCINE  Completed   DEXA SCAN  Completed   Hepatitis C Screening  Completed   HPV VACCINES  Aged Out    Health Maintenance  Health Maintenance Due  Topic Date Due   Zoster Vaccines- Shingrix (1 of 2) Never done   COVID-19 Vaccine (6 - 2023-24 season) 03/21/2023    Colorectal cancer screening: Type of screening: Cologuard. Completed 11/13/22. Repeat every 3 years  Mammogram status: Completed 09/02/22. Repeat every year  Bone Density status: Completed 08/28/20. Results reflect: Bone density results: NORMAL. Repeat every   years.   Additional Screening:  Hepatitis C Screening: does qualify; Completed 11/26/16  Vision Screening: Recommended annual ophthalmology exams for early detection of glaucoma and other disorders of the eye. Is the patient up to date with their annual eye exam?  Yes  Who is the provider or what is the name of the office in which the patient attends annual eye exams? [facility] If pt is not established with a provider, would they like to be referred to a provider to establish care? No .   Dental Screening: Recommended annual dental exams for proper oral hygiene  Diabetic Foot Exam: Diabetic Foot Exam: Completed 10/26/22  Community Resource Referral / Chronic Care Management:  CRR required this visit?  No   CCM required this visit?  No     Plan:     I have personally reviewed and noted the following in the patient's chart:   Medical and social history Use of alcohol, tobacco or illicit drugs  Current medications and supplements including opioid prescriptions. Patient is not currently taking opioid  prescriptions. Functional ability and status Nutritional status Physical activity Advanced directives List of other physicians Hospitalizations, surgeries, and ER visits in previous 12 months Vitals Screenings to include cognitive, depression, and falls Referrals and appointments  In addition, I have reviewed and discussed with patient certain preventive protocols, quality metrics, and best practice recommendations. A written personalized care plan for preventive services as well as general preventive health recommendations were provided to patient.     Tillie Rung, LPN   [id number]   After Visit Summary: (MyChart) Due to this being a telephonic visit, the after visit summary with patients personalized plan was offered to patient via MyChart   Nurse Notes: None

## 2023-06-29 NOTE — Patient Instructions (Addendum)
Ms. Kamienski , Thank you for taking time to come for your Medicare Wellness Visit. I appreciate your ongoing commitment to your health goals. Please review the following plan we discussed and let me know if I can assist you in the future.   Referrals/Orders/Follow-Ups/Clinician Recommendations:   This is a list of the screening recommended for you and due dates:  Health Maintenance  Topic Date Due   Zoster (Shingles) Vaccine (1 of 2) Never done   COVID-19 Vaccine (6 - 2023-24 season) 03/21/2023   Yearly kidney health urinalysis for diabetes  08/25/2023   Complete foot exam   10/26/2023   Hemoglobin A1C  11/04/2023   Yearly kidney function blood test for diabetes  05/05/2024   Eye exam for diabetics  05/10/2024   Medicare Annual Wellness Visit  06/28/2024   Mammogram  09/02/2024   Cologuard (Stool DNA test)  11/12/2025   DTaP/Tdap/Td vaccine (2 - Td or Tdap) 01/01/2027   Pneumonia Vaccine  Completed   Flu Shot  Completed   DEXA scan (bone density measurement)  Completed   Hepatitis C Screening  Completed   HPV Vaccine  Aged Out    Advanced directives:   Next Medicare Annual Wellness Visit scheduled for next year: Yes

## 2023-06-30 ENCOUNTER — Encounter: Payer: Medicare Other | Admitting: Occupational Therapy

## 2023-07-01 ENCOUNTER — Encounter: Payer: Medicare Other | Admitting: Occupational Therapy

## 2023-07-03 ENCOUNTER — Other Ambulatory Visit: Payer: Self-pay | Admitting: Cardiology

## 2023-07-06 ENCOUNTER — Encounter: Payer: Medicare Other | Admitting: Occupational Therapy

## 2023-07-06 DIAGNOSIS — H524 Presbyopia: Secondary | ICD-10-CM | POA: Diagnosis not present

## 2023-07-07 ENCOUNTER — Encounter: Payer: Medicare Other | Admitting: Occupational Therapy

## 2023-07-08 ENCOUNTER — Encounter: Payer: Medicare Other | Admitting: Occupational Therapy

## 2023-07-16 ENCOUNTER — Ambulatory Visit: Payer: Medicare Other | Admitting: Podiatry

## 2023-07-16 ENCOUNTER — Telehealth: Payer: Self-pay

## 2023-07-16 NOTE — Telephone Encounter (Signed)
[facility] - Client Client Site [facility] - Night Provider Copland, Shanda Bumps- MD Contact Type Call Who Is Calling Patient / Member / Family / Caregiver Caller Name [name] Caller Phone Number [phone] Patient Name Emily Wood Patient DOB December 29, 1950 Call Type Message Only Information Provided Reason for Call Request for General Office Information Initial Comment Caller states she was told to call the office back, unsure why. Additional Comment Office hours provided. Disp. Time Disposition Final User 07/16/2023 6:31:44 AM General Information Provided Yes Rowe Robert Call Closed By: Rowe Robert Transaction Date/Time: 07/16/2023 6:28:58 AM (ET

## 2023-07-19 ENCOUNTER — Other Ambulatory Visit: Payer: Self-pay | Admitting: Family Medicine

## 2023-07-19 DIAGNOSIS — I10 Essential (primary) hypertension: Secondary | ICD-10-CM | POA: Diagnosis not present

## 2023-07-19 DIAGNOSIS — E1169 Type 2 diabetes mellitus with other specified complication: Secondary | ICD-10-CM | POA: Diagnosis not present

## 2023-07-19 DIAGNOSIS — N1832 Chronic kidney disease, stage 3b: Secondary | ICD-10-CM | POA: Diagnosis not present

## 2023-07-19 DIAGNOSIS — E1122 Type 2 diabetes mellitus with diabetic chronic kidney disease: Secondary | ICD-10-CM | POA: Diagnosis not present

## 2023-07-19 DIAGNOSIS — R809 Proteinuria, unspecified: Secondary | ICD-10-CM | POA: Diagnosis not present

## 2023-07-19 DIAGNOSIS — E0500 Thyrotoxicosis with diffuse goiter without thyrotoxic crisis or storm: Secondary | ICD-10-CM | POA: Diagnosis not present

## 2023-07-19 DIAGNOSIS — E1129 Type 2 diabetes mellitus with other diabetic kidney complication: Secondary | ICD-10-CM | POA: Diagnosis not present

## 2023-07-19 LAB — HEMOGLOBIN A1C: Hemoglobin A1C: 6.7

## 2023-07-20 ENCOUNTER — Telehealth: Payer: Self-pay | Admitting: Cardiology

## 2023-07-20 NOTE — Telephone Encounter (Signed)
Called and spoke to patient. Verified name and DOB. Inform patient medication was fill by PCP per medication list. No further assistance at this time.

## 2023-07-20 NOTE — Telephone Encounter (Signed)
 Pt c/o medication issue:  1. Name of Medication:   torsemide  (DEMADEX ) 10 MG tablet    2. How are you currently taking this medication (dosage and times per day)? As written  3. Are you having a reaction (difficulty breathing--STAT)? none  4. What is your medication issue?  Pt thinks she threw medication away and wants another refills.

## 2023-07-24 NOTE — Progress Notes (Signed)
 [facility] [address] [place], [place] [address] [phone]  Date:  07/26/2023   Name:  KEELEIGH TERRIS   DOB:  06-Sep-1950   MRN:  [id number]  PCP:  Watt Harlene BROCKS, MD    Chief Complaint: Annual Exam (Concerns/ questions: none/Shingrix- Medicare pt)   History of Present Illness:  Emily Wood is a 73 y.o. very pleasant female patient who presents with the following:  Patient seen today for physical exam Most recent visit with myself was in October for follow-up History of diabetes, chronic kidney disease, hypertension, venous insufficiency and lymphedema, obesity, Graves' hyperthyroidism in remission, CAD seen on coronary imaging  She follows up with nephrology, Dr. Modesto- will see him pretty soon for follow-up Endocrinologist is Dr. Damian with the [facility] clinic in [place]-she was seen just recently.  Using methimazole  2.5 for her Graves' disease She attended  lymphedema clinic to help with her venous insufficiency and associated swelling- she notes her sx are stable.  She was getting some treatments at lymphedema clinic but she finished this. She does maintenance at home now  She had cataract surgery-ever since the surgery she has noted some ptosis and more difficulty with her right eye vision.  Apparently her ophthalmologist wondered if this could be myasthenia gravis; this is possible, though she had no symptoms prior to her cataract surgery.  We can start with doing a myasthenia gravis antibody panel, otherwise did explain to [place] this is less likely be positive in patients who have only ophthalmologic symptoms  She notes she has gained a bit of weight- likely due to eating more sweets over the holidays She is on Ozempic  0.5, she would be interested in going up to 1 mg to assist with weight loss  Wt Readings from Last 3 Encounters:  07/26/23 227 lb 9.6 oz (103.2 kg)  06/29/23 214 lb (97.1 kg)   05/14/23 214 lb (97.1 kg)   Her cardiologist is Dr. Rayna recent visit was in October: 1. Coronary artery disease involving native coronary artery of native heart without angina pectoris   2. Essential hypertension   3. Sinus bradycardia   4. PVC (premature ventricular contraction)   5. Mixed hyperlipidemia   6. Diabetes mellitus due to underlying condition with hyperosmolarity without coma, without long-term current use of insulin  (HCC)   7. Moderate tricuspid regurgitation     PLAN:     Hypertension Elevated blood pressure despite current regimen of Amlodipine , Carvedilol , Avapro , and Clonidine . Hydrochlorothiazide  was discontinued due to low kidney function. -Increase Amlodipine  to 10mg  daily. -Follow up with the pharmacist in 4 weeks to monitor blood pressure. Lymphedema Undergoing treatment at a lymphedema clinic, with wrapping of one limb until garments can be obtained. -Continue current treatment plan at lymphedema clinic. Post-Cataract Surgery Complications Swelling of the retina and fluid accumulation behind the retina following cataract surgery, leading to impaired vision and difficulty reading. -Continue current treatment plan with ophthalmologist, including intraocular injections. General Health Maintenance / Followup Plans -Follow up with primary care provider in 6 months.  COVID booster- recommended  Shingrix- recommended  Due for urine micro A1c is up-to-date, showed good control of diabetes Lab Results  Component Value Date   HGBA1C 6.4 05/06/2023   Aspirin  81 Amlodipine  10 Carvedilol  12.5 twice daily Irbersartan 300 Imdur  Clonidine  0.2 3 times daily Methimazole  2.5 Ditropan  10 mg Ozempic - ?  Dosage may be able to go up Potassium 20 mill equivalents  daily Torsemide  as needed Lipitor 40 Gabapentin  400 twice daily   Patient Active Problem List   Diagnosis Date Noted   Sinus bradycardia 05/14/2023   PVC (premature ventricular contraction)  05/14/2023   Diabetes mellitus due to underlying condition with hyperosmolarity without coma, without long-term current use of insulin  (HCC) 05/14/2023   Full thickness rotator cuff tear 07/24/2022   Normocytic anemia 05/23/2022   AKI (acute kidney injury) (HCC) 05/21/2022   Snoring 11/05/2021   Daytime somnolence 11/05/2021   Lipoma 10/17/2021   Subacromial bursitis of left shoulder joint 10/17/2021   Coronary artery disease involving native coronary artery of native heart without angina pectoris 05/15/2021   Nonrheumatic tricuspid valve regurgitation 05/15/2021   Pulmonary hypertension, unspecified (HCC) 05/15/2021   Mixed hyperlipidemia 02/20/2021   Obesity (BMI 30-39.9) 02/20/2021   Diabetic retinopathy (HCC) 08/28/2020   Mixed incontinence 05/31/2020   Graves disease 04/14/2020   Right renal mass 11/22/2019   Arthritis of left knee 09/25/2019   Lymphedema 01/25/2017   Fibroid uterus 01/14/2017   Chronic kidney disease, stage 3 (HCC) 09/21/2016   Meralgia paresthetica of left side 08/27/2015   Diabetes mellitus type 2, controlled (HCC) 08/02/2015   Chronic venous insufficiency 06/28/2015   Essential hypertension 02/15/2015   Peripheral edema 02/15/2015   Chronic knee pain 02/15/2015    Past Medical History:  Diagnosis Date   Arthritis    Diabetes mellitus without complication (HCC)    Family history of adverse reaction to anesthesia    PONV   Graves disease 04/14/2020   Hypertension    Lymphedema    Mixed hyperlipidemia 02/20/2021    Past Surgical History:  Procedure Laterality Date   CATARACT EXTRACTION W/PHACO Right 04/14/2022   Procedure: CATARACT EXTRACTION PHACO AND INTRAOCULAR LENS PLACEMENT (IOC) RIGHT DIABETIC 10.93 00:53.0;  Surgeon: Jaye Fallow, MD;  Location: [facility];  Service: Ophthalmology;  Laterality: Right;   CATARACT EXTRACTION W/PHACO Left 04/28/2022   Procedure: CATARACT EXTRACTION PHACO AND INTRAOCULAR LENS PLACEMENT (IOC) LEFT  DIABETIC 6.05 00:45.7;  Surgeon: Jaye Fallow, MD;  Location: [facility];  Service: Ophthalmology;  Laterality: Left;  Diabetic   CHOLECYSTECTOMY     HERNIA REPAIR     OTHER SURGICAL HISTORY     scar tissue removal from bowels   PARTIAL HYSTERECTOMY     Still has ovaries   REVERSE SHOULDER ARTHROPLASTY Right 09/11/2022   Procedure: REVERSE SHOULDER ARTHROPLASTY;  Surgeon: Sharl Selinda Dover, MD;  Location: [facility];  Service: Orthopedics;  Laterality: Right;  120   UTERINE FIBROID SURGERY      Social History   Tobacco Use   Smoking status: Never   Smokeless tobacco: Never  Vaping Use   Vaping status: Never Used  Substance Use Topics   Alcohol  use: No    Alcohol /week: 0.0 standard drinks of alcohol     Comment: rarely   Drug use: No    Family History  Problem Relation Age of Onset   Hyperlipidemia Mother    Cancer Sister    Hyperlipidemia Maternal Aunt    Diabetes Maternal Aunt    Breast cancer Cousin    Diabetes Maternal Uncle     No Known Allergies  Medication list has been reviewed and updated.  Current Outpatient Medications on File Prior to Visit  Medication Sig Dispense Refill   Accu-Chek Softclix Lancets lancets CHECK BLOOD SUGAR ONCE DAILY 100 each 2   Alpha-Lipoic Acid 300 MG CAPS Take 600 mg by mouth daily.     amLODipine  (  NORVASC ) 10 MG tablet Take 1 tablet (10 mg total) by mouth daily. 90 tablet 3   Ascorbic Acid (VITAMIN C) 1000 MG tablet Take 1,000 mg by mouth daily.     aspirin  81 MG tablet Take 81 mg by mouth daily.     atorvastatin  (LIPITOR) 40 MG tablet TAKE 1 TABLET BY MOUTH DAILY 100 tablet 2   Biotin w/ Vitamins C & E (HAIR/SKIN/NAILS PO) Take 6,000 mcg by mouth daily.     Blood Pressure Monitor MISC Use to check blood pressure daily.  Please include a large cuff     carvedilol  (COREG ) 12.5 MG tablet TAKE 1 TABLET BY MOUTH TWICE  DAILY 200 tablet 2   Cholecalciferol (VITAMIN D3) 125 MCG (5000 UT) CAPS Take 1 capsule (5,000 Units  total) by mouth daily.     cloNIDine  (CATAPRES ) 0.2 MG tablet Take 1 tablet (0.2 mg total) by mouth 3 (three) times daily. 270 tablet 3   Cobalamin Combinations (B-12) [phone] MCG SUBL Place 5,000 mcg under the tongue daily.     diclofenac  Sodium (VOLTAREN ) 1 % GEL Apply 4 g topically 4 (four) times daily. (Patient taking differently: Apply 4 g topically daily as needed.) 100 g 0   ferrous sulfate  324 MG TBEC Take 324 mg by mouth daily.     gabapentin  (NEURONTIN ) 400 MG capsule Take 2 capsules (800 mg total) by mouth 2 (two) times daily. 360 capsule 1   GEMTESA 75 MG TABS Take 75 mg by mouth daily.     glucose blood (ACCU-CHEK GUIDE) test strip CHECK BLOOD SUGAR ONCE DAILY 100 strip 2   irbesartan  (AVAPRO ) 300 MG tablet TAKE 1 TABLET BY MOUTH DAILY 100 tablet 2   isosorbide  mononitrate (IMDUR ) 30 MG 24 hr tablet TAKE 1 TABLET BY MOUTH DAILY 100 tablet 3   methimazole  (TAPAZOLE ) 5 MG tablet Take 2.5 mg by mouth daily.     Multiple Vitamin (MULTIVITAMIN ADULT PO) Take 2 tablets by mouth daily.     nystatin  cream (MYCOSTATIN ) Apply 1 application topically 2 (two) times daily. (Patient taking differently: Apply 1 application  topically 2 (two) times daily as needed for dry skin.) 30 g 1   oxybutynin  (DITROPAN -XL) 10 MG 24 hr tablet Take 10 mg by mouth at bedtime.     OZEMPIC , 0.25 OR 0.5 MG/DOSE, 2 MG/3ML SOPN Inject 0.5 mg into the skin once a week.     potassium chloride  SA (KLOR-CON  M) 20 MEQ tablet TAKE 1 TABLET BY MOUTH DAILY 90 tablet 3   prednisoLONE  acetate (PRED FORTE ) 1 % ophthalmic suspension Place 1 drop into both eyes 4 (four) times daily. 5 mL 0   torsemide  (DEMADEX ) 10 MG tablet TAKE 1 TABLET (10 MG TOTAL) BY MOUTH DAILY. USE AS NEEDED FOR LOWER EXTREMITY SWELLING 90 tablet 0   Turmeric 500 MG CAPS Take 500 mg by mouth daily.     No current facility-administered medications on file prior to visit.    Review of Systems:  As per HPI- otherwise negative.   Physical  Examination: Vitals:   07/26/23 1117  BP: 132/60  Pulse: 60  Resp: 18  Temp: 97.8 F (36.6 C)  SpO2: 98%   Vitals:   07/26/23 1117  Weight: 227 lb 9.6 oz (103.2 kg)  Height: 5' 5 (1.651 m)   Body mass index is 37.87 kg/m. Ideal Body Weight: Weight in (lb) to have BMI = 25: 149.9  GEN: no acute distress.  Obese, looks well  HEENT: Atraumatic, Normocephalic.  Bilateral TM wnl, oropharynx normal.  PEERL,EOMI.   There is minimal ptosis of both upper eyelids  Ears and Nose: No external deformity. CV: RRR, No M/G/R. No JVD. No thrill. No extra heart sounds. PULM: CTA B, no wheezes, crackles, rhonchi. No retractions. No resp. distress. No accessory muscle use. ABD: S, NT, ND EXTR: No c/c/e PSYCH: Normally interactive. Conversant.  Stable LE edema-wearing compression  Assessment and Plan: Stage 3a chronic kidney disease (HCC) - Plan: Microalbumin / creatinine urine ratio  Controlled type 2 diabetes mellitus without complication, without long-term current use of insulin  (HCC) - Plan: Semaglutide , 1 MG/DOSE, 4 MG/3ML SOPN  Essential hypertension - Plan: CBC, Comprehensive metabolic panel  Obesity (BMI 30-39.9) - Plan: Semaglutide , 1 MG/DOSE, 4 MG/3ML SOPN  Peripheral edema  Lymphedema  Drooping eyelid, right - Plan: Myasthenia gravis panel 2  Patient seen today for follow-up.  Will check on urine microalbumin Increase Ozempic  to 1 mg, can increase again to 2mg  in a month or so Blood pressure well-controlled Lymphedema is stable, patient is managing this at home Will run a myasthenia gravis antibody panel for further evaluation of ptosis Will plan further follow- up pending labs.  Signed Harlene Schroeder, MD  Received labs as below, message to patient  Results for orders placed or performed in visit on 07/26/23  Microalbumin / creatinine urine ratio   Collection Time: 07/26/23 11:57 AM  Result Value Ref Range   Microalb, Ur 3.7 (H) 0.0 - 1.9 mg/dL   Creatinine,U  70.2 mg/dL   Microalb Creat Ratio 12.6 0.0 - 30.0 mg/g  CBC   Collection Time: 07/26/23 11:57 AM  Result Value Ref Range   WBC 5.1 4.0 - 10.5 K/uL   RBC 3.70 (L) 3.87 - 5.11 Mil/uL   Platelets 289.0 150.0 - 400.0 K/uL   Hemoglobin 10.4 (L) 12.0 - 15.0 g/dL   HCT 68.0 (L) 63.9 - 53.9 %   MCV 86.0 78.0 - 100.0 fl   MCHC 32.6 30.0 - 36.0 g/dL   RDW 84.6 88.4 - 84.4 %  Comprehensive metabolic panel   Collection Time: 07/26/23 11:57 AM  Result Value Ref Range   Sodium 143 135 - 145 mEq/L   Potassium 4.3 3.5 - 5.1 mEq/L   Chloride 106 96 - 112 mEq/L   CO2 28 19 - 32 mEq/L   Glucose, Bld 86 70 - 99 mg/dL   BUN 17 6 - 23 mg/dL   Creatinine, Ser 8.65 (H) 0.40 - 1.20 mg/dL   Total Bilirubin 0.9 0.2 - 1.2 mg/dL   Alkaline Phosphatase 122 (H) 39 - 117 U/L   AST 22 0 - 37 U/L   ALT 14 0 - 35 U/L   Total Protein 8.1 6.0 - 8.3 g/dL   Albumin 4.0 3.5 - 5.2 g/dL   GFR 60.10 (L) >39.99 mL/min   Calcium  9.5 8.4 - 10.5 mg/dL

## 2023-07-24 NOTE — Patient Instructions (Addendum)
 It was good to see you today, I will be in touch with your lab work asap Please get the shingles vaccine series and an updated dose of covid if not done in the last 6 months or so- can be done at your pharmacy  Take care!  We will look for any sign of myasthenia gravis on your labs.  However, as we discussed the antibody test is not always positive with this condition, esp for people who just have eye symptoms

## 2023-07-26 ENCOUNTER — Encounter: Payer: Medicare Other | Admitting: Occupational Therapy

## 2023-07-26 ENCOUNTER — Encounter: Payer: Self-pay | Admitting: Family Medicine

## 2023-07-26 ENCOUNTER — Ambulatory Visit (INDEPENDENT_AMBULATORY_CARE_PROVIDER_SITE_OTHER): Payer: Medicare Other | Admitting: Family Medicine

## 2023-07-26 VITALS — BP 132/60 | HR 60 | Temp 97.8°F | Resp 18 | Ht 65.0 in | Wt 227.6 lb

## 2023-07-26 DIAGNOSIS — N1831 Chronic kidney disease, stage 3a: Secondary | ICD-10-CM

## 2023-07-26 DIAGNOSIS — I10 Essential (primary) hypertension: Secondary | ICD-10-CM

## 2023-07-26 DIAGNOSIS — E119 Type 2 diabetes mellitus without complications: Secondary | ICD-10-CM

## 2023-07-26 DIAGNOSIS — I890 Lymphedema, not elsewhere classified: Secondary | ICD-10-CM

## 2023-07-26 DIAGNOSIS — R600 Localized edema: Secondary | ICD-10-CM | POA: Diagnosis not present

## 2023-07-26 DIAGNOSIS — H02401 Unspecified ptosis of right eyelid: Secondary | ICD-10-CM | POA: Diagnosis not present

## 2023-07-26 DIAGNOSIS — E669 Obesity, unspecified: Secondary | ICD-10-CM

## 2023-07-26 LAB — CBC
HCT: 31.9 % — ABNORMAL LOW (ref 36.0–46.0)
Hemoglobin: 10.4 g/dL — ABNORMAL LOW (ref 12.0–15.0)
MCHC: 32.6 g/dL (ref 30.0–36.0)
MCV: 86 fL (ref 78.0–100.0)
Platelets: 289 10*3/uL (ref 150.0–400.0)
RBC: 3.7 Mil/uL — ABNORMAL LOW (ref 3.87–5.11)
RDW: 15.3 % (ref 11.5–15.5)
WBC: 5.1 10*3/uL (ref 4.0–10.5)

## 2023-07-26 LAB — MICROALBUMIN / CREATININE URINE RATIO
Creatinine,U: 29.7 mg/dL
Microalb Creat Ratio: 12.6 mg/g (ref 0.0–30.0)
Microalb, Ur: 3.7 mg/dL — ABNORMAL HIGH (ref 0.0–1.9)

## 2023-07-26 LAB — COMPREHENSIVE METABOLIC PANEL
ALT: 14 U/L (ref 0–35)
AST: 22 U/L (ref 0–37)
Albumin: 4 g/dL (ref 3.5–5.2)
Alkaline Phosphatase: 122 U/L — ABNORMAL HIGH (ref 39–117)
BUN: 17 mg/dL (ref 6–23)
CO2: 28 meq/L (ref 19–32)
Calcium: 9.5 mg/dL (ref 8.4–10.5)
Chloride: 106 meq/L (ref 96–112)
Creatinine, Ser: 1.34 mg/dL — ABNORMAL HIGH (ref 0.40–1.20)
GFR: 39.89 mL/min — ABNORMAL LOW (ref >60.00)
Glucose, Bld: 86 mg/dL (ref 70–99)
Potassium: 4.3 meq/L (ref 3.5–5.1)
Sodium: 143 meq/L (ref 135–145)
Total Bilirubin: 0.9 mg/dL (ref 0.2–1.2)
Total Protein: 8.1 g/dL (ref 6.0–8.3)

## 2023-07-26 MED ORDER — SEMAGLUTIDE (1 MG/DOSE) 4 MG/3ML ~~LOC~~ SOPN: 1.0000 mg | PEN_INJECTOR | SUBCUTANEOUS | 1 refills | Status: AC

## 2023-07-27 ENCOUNTER — Other Ambulatory Visit: Payer: Self-pay | Admitting: Family Medicine

## 2023-07-27 DIAGNOSIS — I10 Essential (primary) hypertension: Secondary | ICD-10-CM

## 2023-07-28 ENCOUNTER — Encounter: Payer: Medicare Other | Admitting: Occupational Therapy

## 2023-08-02 ENCOUNTER — Encounter: Payer: Self-pay | Admitting: Family Medicine

## 2023-08-02 LAB — MYASTHENIA GRAVIS PANEL 2
A CHR BINDING ABS: <0.3 nmol/L
ACHR Blocking Abs: <15 %{inhibition} (ref <15)
Acetylchol Modul Ab: <1 %{inhibition}

## 2023-08-03 DIAGNOSIS — H35353 Cystoid macular degeneration, bilateral: Secondary | ICD-10-CM | POA: Diagnosis not present

## 2023-08-03 DIAGNOSIS — H209 Unspecified iridocyclitis: Secondary | ICD-10-CM | POA: Diagnosis not present

## 2023-08-03 DIAGNOSIS — H02403 Unspecified ptosis of bilateral eyelids: Secondary | ICD-10-CM | POA: Diagnosis not present

## 2023-08-04 ENCOUNTER — Ambulatory Visit: Payer: Medicare Other | Admitting: Podiatry

## 2023-08-04 ENCOUNTER — Encounter: Payer: Self-pay | Admitting: Podiatry

## 2023-08-04 DIAGNOSIS — B351 Tinea unguium: Secondary | ICD-10-CM

## 2023-08-04 DIAGNOSIS — M79674 Pain in right toe(s): Secondary | ICD-10-CM

## 2023-08-04 DIAGNOSIS — M79675 Pain in left toe(s): Secondary | ICD-10-CM

## 2023-08-04 NOTE — Progress Notes (Signed)
 Subjective:   Patient ID: Emily Wood, female   DOB: 73 y.o.   MRN: [id number]   HPI Patient presents with significant thickness and pain of the nailbeds 1-5 both feet that she cannot cut   ROS      Objective:  Physical Exam  Neuro vascular status intact thick yellow brittle nailbeds 1-5 both feet painful     Assessment:  Mycotic nail infection with pain 1-5 both feet     Plan:  Debridement nailbeds 1-5 both feet no iatrogenic bleeding reappoint routine care

## 2023-08-05 ENCOUNTER — Encounter: Payer: Self-pay | Admitting: Pharmacist

## 2023-08-05 ENCOUNTER — Ambulatory Visit: Payer: Medicare Other | Attending: Internal Medicine | Admitting: Pharmacist

## 2023-08-05 VITALS — BP 178/80 | HR 61

## 2023-08-05 DIAGNOSIS — I10 Essential (primary) hypertension: Secondary | ICD-10-CM

## 2023-08-05 NOTE — Patient Instructions (Addendum)
It was good seeing you again  Your blood pressure is elevated today. Please take your medications as soon as you arrive home.  Try to reduce the amount of salt you are eating  Please try to remember to take your clonidine 0.2mg  three times daily with about 6-8 hours in between doses  Continue: Amlodipine 10mg  daily Carvedilol 12.5mg  BID Clonidine 0.2mg  TID Irbesartan 300mg  daily Isosorbide 30mg  daily Torsemide 10mg  PRN daily  We will see you back next month.  Laural Golden, PharmD, BCACP, CDCES, CPP [address], [address] [place], [place], [address] Phone: [phone], Fax: [phone]

## 2023-08-05 NOTE — Progress Notes (Signed)
Patient ID: KENSINGTON LUCKENBACH                 DOB: Jan 04, 1951                      MRN: [id number]     HPI: Emily Wood is a 73 y.o. female referred by Dr. Servando Salina to HTN clinic. PMH is significant for CAD, pulmonary HTN, CKD, HLD, HTN, and T2DM.  Patient presents today to discuss BP management. Reports feeling of depression at home. Husband is truck Hospital doctor and is often out of town.   Wakes up late and does not take morning medications until around noon when she watches her tv programs.  Has not been checking her BP at home.  Reports diet could be better. Has been eating too many sweet and salty foods. Husband brought home fried chicken last night.  Was advised at last visit to begin taking clonidine 0.2mg  three times daily as it was prescribed however she has continued to take twice daily.  Had to drive her husband to work this morning so she has not taken her medications. Blood pressure was better controlled at PCP visit on 07/26/23 at 132/60.  Current HTN meds:  Amlodipine 10mg  daily Carvedilol 12.5mg  BID Clonidine 0.2mg  TID (has been taking BID) Irbesartan 300mg  daily Isosorbide 30mg  daily Torsemide 10mg  PRN daily  BP goal: <130/80   Wt Readings from Last 3 Encounters:  05/14/23 214 lb (97.1 kg)  05/06/23 215 lb 6.4 oz (97.7 kg)  10/26/22 193 lb 6.4 oz (87.7 kg)   BP Readings from Last 3 Encounters:  05/14/23 (!) 168/90  05/06/23 (!) 150/80  10/26/22 (!) 144/88   Pulse Readings from Last 3 Encounters:  05/14/23 (!) 55  05/06/23 65  10/26/22 64    Renal function: CrCl cannot be calculated (Patient's most recent lab result is older than the maximum 21 days allowed.).  Past Medical History:  Diagnosis Date   Arthritis    Diabetes mellitus without complication (HCC)    Family history of adverse reaction to anesthesia    PONV   Graves disease 04/14/2020   Hypertension    Lymphedema    Mixed hyperlipidemia 02/20/2021    Current Outpatient Medications on  File Prior to Visit  Medication Sig Dispense Refill   Accu-Chek Softclix Lancets lancets CHECK BLOOD SUGAR ONCE DAILY 100 each 2   Alpha-Lipoic Acid 300 MG CAPS Take 600 mg by mouth daily.     amLODipine (NORVASC) 10 MG tablet Take 1 tablet (10 mg total) by mouth daily. 90 tablet 3   Ascorbic Acid (VITAMIN C) 1000 MG tablet Take 1,000 mg by mouth daily.     aspirin 81 MG tablet Take 81 mg by mouth daily.     atorvastatin (LIPITOR) 40 MG tablet TAKE 1 TABLET BY MOUTH DAILY 100 tablet 2   Biotin w/ Vitamins C & E (HAIR/SKIN/NAILS PO) Take 6,000 mcg by mouth daily.     Blood Pressure Monitor MISC Use to check blood pressure daily.  Please include a large cuff     carvedilol (COREG) 12.5 MG tablet TAKE 1 TABLET BY MOUTH TWICE  DAILY 200 tablet 2   Cholecalciferol (VITAMIN D3) 125 MCG (5000 UT) CAPS Take 1 capsule (5,000 Units total) by mouth daily.     cloNIDine (CATAPRES) 0.2 MG tablet Take 1 tablet (0.2 mg total) by mouth 3 (three) times daily. (Patient taking differently: Take 0.2 mg by mouth 2 (two) times daily.)  270 tablet 0   Cobalamin Combinations (B-12) [phone] MCG SUBL Place 5,000 mcg under the tongue daily.     diclofenac Sodium (VOLTAREN) 1 % GEL Apply 4 g topically 4 (four) times daily. (Patient taking differently: Apply 4 g topically daily as needed.) 100 g 0   ferrous sulfate 324 MG TBEC Take 324 mg by mouth daily.     gabapentin (NEURONTIN) 400 MG capsule Take 2 capsules (800 mg total) by mouth 3 (three) times daily. (Patient taking differently: Take 800 mg by mouth 2 (two) times daily.) 540 capsule 0   GEMTESA 75 MG TABS Take 75 mg by mouth daily. (Patient not taking: Reported on 05/28/2023)     glucose blood (ACCU-CHEK GUIDE) test strip CHECK BLOOD SUGAR ONCE DAILY 100 strip 2   irbesartan (AVAPRO) 300 MG tablet TAKE 1 TABLET BY MOUTH DAILY 100 tablet 2   isosorbide mononitrate (IMDUR) 30 MG 24 hr tablet TAKE 1 TABLET BY MOUTH DAILY 100 tablet 0   methimazole (TAPAZOLE) 5 MG tablet  Take 2.5 mg by mouth daily.     Multiple Vitamin (MULTIVITAMIN ADULT PO) Take 2 tablets by mouth daily.     nystatin cream (MYCOSTATIN) Apply 1 application topically 2 (two) times daily. (Patient taking differently: Apply 1 application  topically 2 (two) times daily as needed for dry skin.) 30 g 1   oxybutynin (DITROPAN-XL) 10 MG 24 hr tablet Take 10 mg by mouth at bedtime.     OZEMPIC, 0.25 OR 0.5 MG/DOSE, 2 MG/3ML SOPN Inject 0.5 mg into the skin once a week.     potassium chloride SA (KLOR-CON M) 20 MEQ tablet Take 1 tablet (20 mEq total) by mouth daily. 90 tablet 0   prednisoLONE acetate (PRED FORTE) 1 % ophthalmic suspension Place 1 drop into both eyes 4 (four) times daily. 5 mL 0   torsemide (DEMADEX) 10 MG tablet TAKE 1 TABLET (10 MG TOTAL) BY MOUTH DAILY. USE AS NEEDED FOR LOWER EXTREMITY SWELLING 90 tablet 1   Turmeric 500 MG CAPS Take 500 mg by mouth daily.     No current facility-administered medications on file prior to visit.    No Known Allergies   Assessment/Plan:  1. Hypertension -   HYPERTENSION CONTROL Vitals:   08/05/23 0942 08/05/23 0945  BP: (!) 174/77 (!) 178/80    The patient's blood pressure is elevated above target today.  In order to address the patient's elevated BP: A current anti-hypertensive medication was adjusted today.    Patient BP in room elevated at 174/77 however patient has not taken her medications yet today. Advised to immediately take morning meds when she returns home.  Reiterated that her clonidine is dosed 3x daily. Recommended she allow 6-8 hours in between doses.   Recommended decreasing foods high in salts and sugar.   Will follow up in 6 weeks.  Continue: Amlodipine 10mg  daily Carvedilol 12.5mg  BID Clonidine 0.2mg  TID Irbesartan 300mg  daily Isosorbide 30mg  daily Torsemide 10mg  PRN daily Follow up in 6-8 weeks  Laural Golden, PharmD, BCACP, CDCES, CPP [address], [address] [place], [place], [address] Phone: [phone],  Fax: [phone]

## 2023-08-10 ENCOUNTER — Telehealth: Payer: Self-pay | Admitting: Family Medicine

## 2023-08-10 NOTE — Telephone Encounter (Signed)
Copied from CRM [id number]. Topic: Medical Record Request - Payor/Billing Request >> Aug 09, 2023  4:53 PM Fuller Mandril wrote: Reason for CRM: Okey Dupre called from [employer] Chronic verification unit. States patient on chronic condition plan and they need to confirm if he has a diagnosis of diabetes cardiovascular or heart failure. Call back: [phone]  Reference: [id number]

## 2023-08-19 NOTE — Progress Notes (Signed)
MRN : [id number]  Emily Wood is a 73 y.o. ([birth year]) female who presents with chief complaint of legs swell.  History of Present Illness:   The patient returns to the office for followup evaluation regarding leg swelling.  The swelling has persisted but with the lymph pump is controlled. The pain associated with swelling is essentially eliminated . There have not been any interval development of a ulcerations or wounds.   The patient notes some problems with the pump, noting it is not working well and the leggings are in poor condition.  She notes that the machine itself is now 72 years old.   Since the previous visit the patient has been wearing graduated compression stockings and using the lymph pump on a routine basis and  has noted significant improvement in the lymphedema.    Patient stated the lymph pump has been a very positive factor in her care.   No outpatient medications have been marked as taking for the 08/23/23 encounter (Appointment) with Gilda Crease, Latina Craver, MD.    Past Medical History:  Diagnosis Date   Arthritis    Diabetes mellitus without complication [facility])    Family history of adverse reaction to anesthesia    PONV   Graves disease 04/14/2020   Hypertension    Lymphedema    Mixed hyperlipidemia 02/20/2021    Past Surgical History:  Procedure Laterality Date   CATARACT EXTRACTION W/PHACO Right 04/14/2022   Procedure: CATARACT EXTRACTION PHACO AND INTRAOCULAR LENS PLACEMENT (IOC) RIGHT DIABETIC 10.93 00:53.0;  Surgeon: Galen Manila, MD;  Location: [facility];  Service: Ophthalmology;  Laterality: Right;   CATARACT EXTRACTION W/PHACO Left 04/28/2022   Procedure: CATARACT EXTRACTION PHACO AND INTRAOCULAR LENS PLACEMENT (IOC) LEFT DIABETIC 6.05 00:45.7;  Surgeon: Galen Manila, MD;  Location: [facility];  Service: Ophthalmology;  Laterality: Left;  Diabetic   CHOLECYSTECTOMY     HERNIA REPAIR      OTHER SURGICAL HISTORY     scar tissue removal from bowels   PARTIAL HYSTERECTOMY     Still has ovaries   REVERSE SHOULDER ARTHROPLASTY Right 09/11/2022   Procedure: REVERSE SHOULDER ARTHROPLASTY;  Surgeon: Yolonda Kida, MD;  Location: [facility];  Service: Orthopedics;  Laterality: Right;  120   UTERINE FIBROID SURGERY      Social History Social History   Tobacco Use   Smoking status: Never   Smokeless tobacco: Never  Vaping Use   Vaping status: Never Used  Substance Use Topics   Alcohol use: No    Alcohol/week: 0.0 standard drinks of alcohol    Comment: rarely   Drug use: No    Family History Family History  Problem Relation Age of Onset   Hyperlipidemia Mother    Cancer Sister    Hyperlipidemia Maternal Aunt    Diabetes Maternal Aunt    Breast cancer Cousin    Diabetes Maternal Uncle     No Known Allergies   REVIEW OF SYSTEMS (Negative unless checked)  Constitutional: [] Weight loss  [] Fever  [] Chills Cardiac: [] Chest pain   [] Chest pressure   [] Palpitations   [] Shortness of breath when laying flat   [] Shortness of breath with exertion. Vascular:  [] Pain in legs with walking   [x] Pain in legs with standing  [] History of DVT   [] Phlebitis   [x] Swelling in legs   []   Varicose veins   [] Non-healing ulcers Pulmonary:   [] Uses home oxygen   [] Productive cough   [] Hemoptysis   [] Wheeze  [] COPD   [] Asthma Neurologic:  [] Dizziness   [] Seizures   [] History of stroke   [] History of TIA  [] Aphasia   [] Vissual changes   [] Weakness or numbness in arm   [] Weakness or numbness in leg Musculoskeletal:   [] Joint swelling   [] Joint pain   [] Low back pain Hematologic:  [] Easy bruising  [] Easy bleeding   [] Hypercoagulable state   [] Anemic Gastrointestinal:  [] Diarrhea   [] Vomiting  [] Gastroesophageal reflux/heartburn   [] Difficulty swallowing. Genitourinary:  [] Chronic kidney disease   [] Difficult urination  [] Frequent urination   [] Blood in urine Skin:  [] Rashes   [] Ulcers   Psychological:  [] History of anxiety   []  History of major depression.  Physical Examination  There were no vitals filed for this visit. There is no height or weight on file to calculate BMI. Gen: WD/WN, NAD Head: [place]/AT, No temporalis wasting.  Ear/Nose/Throat: Hearing grossly intact, nares w/o erythema or drainage, pinna without lesions Eyes: PER, EOMI, sclera nonicteric.  Neck: Supple, no gross masses.  No JVD.  Pulmonary:  Good air movement, no audible wheezing, no use of accessory muscles.  Cardiac: RRR, precordium not hyperdynamic. Vascular:  scattered varicosities present bilaterally.  Mild venous stasis changes to the legs bilaterally.  2-3+ soft pitting edema, CEAP C4sEpAsPr  Vessel Right Left  Radial Palpable Palpable  Gastrointestinal: soft, non-distended. No guarding/no peritoneal signs.  Musculoskeletal: M/S 5/5 throughout.  No deformity.  Neurologic: CN 2-12 intact. Pain and light touch intact in extremities.  Symmetrical.  Speech is fluent. Motor exam as listed above. Psychiatric: Judgment intact, Mood & affect appropriate for pt's clinical situation. Dermatologic: Venous rashes no ulcers noted.  No changes consistent with cellulitis. Lymph : No lichenification or skin changes of chronic lymphedema.  CBC Lab Results  Component Value Date   WBC 5.1 07/26/2023   HGB 10.4 (L) 07/26/2023   HCT 31.9 (L) 07/26/2023   MCV 86.0 07/26/2023   PLT 289.0 07/26/2023    BMET    Component Value Date/Time   NA 143 07/26/2023 1157   NA 141 03/29/2023 0000   K 4.3 07/26/2023 1157   CL 106 07/26/2023 1157   CO2 28 07/26/2023 1157   GLUCOSE 86 07/26/2023 1157   BUN 17 07/26/2023 1157   BUN 19 03/29/2023 0000   CREATININE 1.34 (H) 07/26/2023 1157   CREATININE 1.07 (H) 04/17/2020 1018   CALCIUM 9.5 07/26/2023 1157   GFRNONAA 23 (L) 09/01/2022 0838   CrCl cannot be calculated (Patient's most recent lab result is older than the maximum 21 days allowed.).  COAG No results  found for: "INR", "PROTIME"  Radiology No results found.   Assessment/Plan 1. Lymphedema (Primary) Recommend:  No surgery or intervention at this point in time.   The Patient is CEAP C4sEpAsPr.  The patient has been wearing compression for more than 12 weeks with no or little benefit.  The patient has been exercising daily for more than 12 weeks. The patient has been elevating and taking OTC pain medications for more than 12 weeks.  None of these have have eliminated the pain related to the lymphedema or the discomfort regarding excessive swelling and venous congestion.    I have reviewed my discussion with the patient regarding lymphedema and why it  causes symptoms.  Patient will continue wearing graduated compression on a daily basis. The patient should put the compression  on first thing in the morning and removing them in the evening. The patient should not sleep in the compression.   In addition, behavioral modification throughout the day will be continued.  This will include frequent elevation (such as in a recliner), use of over the counter pain medications as needed and exercise such as walking.  The systemic causes for chronic edema such as liver, kidney and cardiac etiologies do not appear to have significant changed over the past year.    The patient has chronic , severe lymphedema with hyperpigmentation of the skin and has done MLD, skin care, medication, diet, exercise, elevation and compression for 4 weeks with no improvement,  I am recommending replacing her lymphedema pump.  She has had excellent results from the current machine but the machine is no longer working properly and is 72 years old.  The patient still has stage 3 lymphedema and therefore, I believe that a lymph pump is needed to improve the control of the patient's lymphedema and improve the quality of life.  Additionally, a lymph pump is warranted because it will reduce the risk of cellulitis and ulceration in the  future.  Patient should follow-up in six months   2. Mixed hyperlipidemia Continue statin as ordered and reviewed, no changes at this time  3. Controlled type 2 diabetes mellitus without complication, without long-term current use of insulin (HCC) Continue hypoglycemic medications as already ordered, these medications have been reviewed and there are no changes at this time.  Hgb A1C to be monitored as already arranged by primary service  4. Essential hypertension Continue antihypertensive medications as already ordered, these medications have been reviewed and there are no changes at this time.    Levora Dredge, MD  08/19/2023 10:18 AM

## 2023-08-23 ENCOUNTER — Encounter (INDEPENDENT_AMBULATORY_CARE_PROVIDER_SITE_OTHER): Payer: Self-pay | Admitting: Vascular Surgery

## 2023-08-23 ENCOUNTER — Ambulatory Visit (INDEPENDENT_AMBULATORY_CARE_PROVIDER_SITE_OTHER): Payer: Medicare Other | Admitting: Vascular Surgery

## 2023-08-23 VITALS — BP 155/72 | HR 59 | Resp 18 | Ht 65.0 in | Wt 227.0 lb

## 2023-08-23 DIAGNOSIS — E782 Mixed hyperlipidemia: Secondary | ICD-10-CM | POA: Diagnosis not present

## 2023-08-23 DIAGNOSIS — I10 Essential (primary) hypertension: Secondary | ICD-10-CM

## 2023-08-23 DIAGNOSIS — E119 Type 2 diabetes mellitus without complications: Secondary | ICD-10-CM

## 2023-08-23 DIAGNOSIS — I890 Lymphedema, not elsewhere classified: Secondary | ICD-10-CM

## 2023-08-24 ENCOUNTER — Encounter (INDEPENDENT_AMBULATORY_CARE_PROVIDER_SITE_OTHER): Payer: Self-pay | Admitting: Vascular Surgery

## 2023-09-01 ENCOUNTER — Ambulatory Visit: Payer: Medicare Other | Attending: Nurse Practitioner | Admitting: Occupational Therapy

## 2023-09-01 DIAGNOSIS — I890 Lymphedema, not elsewhere classified: Secondary | ICD-10-CM | POA: Diagnosis not present

## 2023-09-01 NOTE — Therapy (Signed)
 +- OUTPATIENT OCCUPATIONAL THERAPY RE-EVALUATION  AND TREATMENT NOTE  BILATERAL LOWER EXTREMITY LYMPHEDEMA   Patient Name: Emily Wood MRN: [id number] DOB:Apr 12, 1951, 73 y.o., female Today's Date: 09/01/2023  END OF SESSION:   OT End of Session - 09/01/23 0845     Visit Number 47    Number of Visits 6    Date for OT Re-Evaluation 11/30/23    OT Start Time 0822    OT Stop Time 0940    OT Time Calculation (min) 78 min    Activity Tolerance Patient tolerated treatment well;No increased pain    Behavior During Therapy [facility] for tasks assessed/performed               Past Medical History:  Diagnosis Date   Arthritis    Diabetes mellitus without complication (HCC)    Family history of adverse reaction to anesthesia    PONV   Graves disease 04/14/2020   Hypertension    Lymphedema    Mixed hyperlipidemia 02/20/2021   Past Surgical History:  Procedure Laterality Date   CATARACT EXTRACTION W/PHACO Right 04/14/2022   Procedure: CATARACT EXTRACTION PHACO AND INTRAOCULAR LENS PLACEMENT (IOC) RIGHT DIABETIC 10.93 00:53.0;  Surgeon: Galen Manila, MD;  Location: [facility];  Service: Ophthalmology;  Laterality: Right;   CATARACT EXTRACTION W/PHACO Left 04/28/2022   Procedure: CATARACT EXTRACTION PHACO AND INTRAOCULAR LENS PLACEMENT (IOC) LEFT DIABETIC 6.05 00:45.7;  Surgeon: Galen Manila, MD;  Location: [facility];  Service: Ophthalmology;  Laterality: Left;  Diabetic   CHOLECYSTECTOMY     HERNIA REPAIR     OTHER SURGICAL HISTORY     scar tissue removal from bowels   PARTIAL HYSTERECTOMY     Still has ovaries   REVERSE SHOULDER ARTHROPLASTY Right 09/11/2022   Procedure: REVERSE SHOULDER ARTHROPLASTY;  Surgeon: Yolonda Kida, MD;  Location: [facility];  Service: Orthopedics;  Laterality: Right;  120   UTERINE FIBROID SURGERY     Patient Active Problem List   Diagnosis Date Noted   Sinus bradycardia 05/14/2023   PVC (premature ventricular  contraction) 05/14/2023   Diabetes mellitus due to underlying condition with hyperosmolarity without coma, without long-term current use of insulin (HCC) 05/14/2023   Full thickness rotator cuff tear 07/24/2022   Normocytic anemia 05/23/2022   AKI (acute kidney injury) (HCC) 05/21/2022   Snoring 11/05/2021   Daytime somnolence 11/05/2021   Lipoma 10/17/2021   Subacromial bursitis of left shoulder joint 10/17/2021   Coronary artery disease involving native coronary artery of native heart without angina pectoris 05/15/2021   Nonrheumatic tricuspid valve regurgitation 05/15/2021   Pulmonary hypertension, unspecified (HCC) 05/15/2021   Mixed hyperlipidemia 02/20/2021   Obesity (BMI 30-39.9) 02/20/2021   Diabetic retinopathy (HCC) 08/28/2020   Mixed incontinence 05/31/2020   Graves disease 04/14/2020   Right renal mass 11/22/2019   Arthritis of left knee 09/25/2019   Lymphedema 01/25/2017   Fibroid uterus 01/14/2017   Chronic kidney disease, stage 3 (HCC) 09/21/2016   Meralgia paresthetica of left side 08/27/2015   Diabetes mellitus type 2, controlled (HCC) 08/02/2015   Chronic venous insufficiency 06/28/2015   Essential hypertension 02/15/2015   Peripheral edema 02/15/2015   Chronic knee pain 02/15/2015    PCP: Pearline Cables, MD  REFERRING PROVIDER: Sheppard Plumber, NP  REFERRING DIAG: I89.0  THERAPY DIAG:  Lymphedema, not elsewhere classified  Rationale for Evaluation and Treatment: Rehabilitation  ONSET DATE: insidious onset >30 years  SUBJECTIVE:  SUBJECTIVE STATEMENT: Mrs Lybarger presents to Occupational Therapy for treatment of BLE lymphedema. She arrives with bilateral, knee length, custom, flat knit, ccl 2 compression stockings in place. She is unaccompanied and walks to clinic. Pt  denies LE-related leg pain, but endorses OA related R knee pain 8/10, despite recent gel injection. Pt reports she has washed her new LLE garment. "It tends to roll down at the outer edge when I pull it up."  PERTINENT HISTORY:  Medical Hx contributing to, or affected by chronic, progressive lymphedema: Norvasc prescription-common side effect is leg swelling; HTN, CVI, Obesity, CAD, OA knees, CKD stage 3, Graves Disease, DM  PAIN:  Are you having pain?  Denies LE-related pain. Endorses R arthritis knee pain 7/10 Pain description: stiff, aching, throbbing, tight, heavy Aggravating factors: standing , walking,  Relieving factors: "The medication from my cardiologist", elevation, compression stockings ( Pt has good hip AROM bilaterally and is able to don them herself w extra time in clinic.)  PRECAUTIONS: Fall and Other: LYMPHEDEMA PRECAUTIONS: (CKD, CAD and DM, GRAVES )   WEIGHT BEARING RESTRICTIONS: No  FALLS: Has patient fallen since last visit? YES. Larey Seat coming down pull-down attic steps while assisting spouse to store holiday decorations  HAND DOMINANCE: right   PRIOR LEVEL OF FUNCTION: Requires assistive device for independence, Needs assistance with homemaking, and Needs assistance with gait  PATIENT GOALS: ".... Replace worn compression garments OBJECTIVE:   Mild, Stage  II, Bilateral Lower Extremity Lymphedema 2/2 CVI and Obesity  OBSERVATIONS / OTHER ASSESSMENTS:   Lymphedema Life Impact Scale (LLIS) score: Initial 10/22/22 = 50%. Final score is 36.76 %. Total lymphedema life impact reduction for this OT CDT course measures 13.24%. (The extent to which L:E-related problems affected your life during the past week)  BLE COMPARATIVE LIMB VOLUMETRICS:  Intake measurements completed 10/22/22  LANDMARK RIGHT   R LEG (A-D) 5043.4 ml  R THIGH (E-G) 4182.3 ml  R FULL LIMB (A-G) 7643.8 ml  Limb Volume differential (LVD)   Leg LVD =12.14%, L>R Thigh LVD 8.3%, L>R Full limb LVD= 8.3%    Volume change since initial %  Volume change overall V  (Blank rows = not tested)  LANDMARK LEFT    R LEG (A-D) 5656.0 ml  R THIGH (E-G) 4529.5 ml  R FULL LIMB (A-G) 10185.5 ml  Limb Volume differential (LVD)  %  Volume change since initial %  Volume change overall %  (Blank rows = not tested)   9th Visit: 12/07/22  [facility] RIGHT   R LEG (A-D) 5116.3 ml  R THIGH (E-G) 5047.6  ml  R FULL LIMB (A-G) 10163.9  ml  Limb Volume differential (LVD)    Volume change since initial R LEG volume INCREASED by 1.4%;  R THIGH INCREASED by 20.7%, and R FULL LIMB INCREASED by 10.17% since initial on 10/22/22.  Volume change overall V  (Blank rows = not tested)       19 th visit 01/18/23  LANDMARK RIGHT   R LEG (A-D) 4606.5 ml  R THIGH (E-G) 4617.0 ml  R FULL LIMB (A-G) 9224 ml  Limb Volume differential (LVD)    Volume change since initial Since last measured on 9th Rx visit on 12/07/22, R LEG volume is DECREASED by 9.96 %, R THIGH volume is DECREASED by 8.5 %, and R FULL LIMB volume is DECREASED by 9.25 %   Volume change overall Since initially measured on 10/22/22 on 1st Rx visit,  R LEG volume is DECREASED by 8.67 %  overall, R THIGH volume is INCREASED by 10 %, and R FULL LIMB volume is DECREASED by 0.018 %    29 th Visit 03/02/23   LANDMARK RIGHT   R LEG (A-D) 5749.7 ml  R THIGH (E-G)  ml  R FULL LIMB (A-G)  ml  Limb Volume differential (LVD)    Volume change since last Since 01/18/23 R leg  is INCREASED by 15.15%.  Volume change overall   (Blank rows = not tested)  LANDMARK LEFT    L LEG (A-D) Since initial 11/11/22 L LEG is INCREASED by 1.66%.   L THIGH (E-G)   L FULL LIMB (A-G)   Limb Volume differential (LVD)    Volume change since initial +1.66%  Volume change overall    39 th Visit 04/07/23  LANDMARK LEFT  L  LEG (A-D) 5297.8  ml  L THIGH (E-G)  ml  L FULL LIMB (A-G)  ml  Limb Volume differential (LVD)    Volume change since last -7.9%  Volume change overall    09/01/23  RE-EVALUATION   LANDMARK RIGHT   R LEG (A-D) 5977.2 ml  R THIGH (E-G)   RLE (A-G)   Limb Volume differential (LVD)    Volume change since last measured on 03/01/23 R LEG INCREASED 12.7%  Volume change overall V  (Blank rows = not tested)  LANDMARK LEFT    L LEG (A-D) 6232.2 ml  L THIGH (E-G)   R FULL LIMB (A-G)   Limb Volume differential (LVD)  %  Volume change since last measured 03/01/23  L LEG INCREASED 8.4 %  Volume change overall %  (Blank rows = not tested)  TODAY'S TREATMENT:  Re-evaluation and follow along visit. Last seen  Bilateral leg Comparative limb volumetrics Assessment of existing compression garments Pt edu for LE self-care                  PATIENT EDUCATION:  Pt edu for LE self care:  Continued Pt/ CG edu for lymphedema self care home program throughout session. Emphasis today on lymphedema precautions and cellulitis ID, prevention and Rx. Discussed again fluid dynamics re elevation, and importance of elevation for lymphedema management and venous return. Other topics include outcome of comparative limb volumetrics- starting limb volume differentials (LVDs), technology and gradient techniques used for short stretch, multilayer compression wrapping, simple self-MLD, therapeutic lymphatic pumping exercises, skin/nail care, LE precautions,. compression garment recommendations and specifications, wear and care schedule and compression garment donning / doffing w assistive devices. Discussed progress towards all OT goals since commencing CDT. All questions answered to the Pt's satisfaction. Good return. Person educated: Patient Education method: Explanation, Demonstration, Tactile cues, Verbal cues, and Handouts Education comprehension: verbalized understanding, returned demonstration, and needs further education  HOME EXERCISE PROGRAM: Intensive Phase Complete Decongestive Therapy (CDT) includes Daily Manual lymphatic drainage: Short neck sequence bilaterally w  diaphragmatic breathing to engage deep abdominal lymphatics. Stationary J strokes to inguinal LN, then proximal to distal dynamic J strokes to thigh, "bottleneck" region, leg and foot. Finally 3 retrograde sweeps back to terminus. Daily Skin care w/ low ph lotion matching skin ph to limit infection risk and improve tissue flexibility and excursion Daily, BLE lymphatic pumping therex- seated or supine Daily (23/7) multilayer compression bandages below the knee to reduce limb volume, one limb at a time (One each 8 cm, 10 cm and 12 cm wide x 5 meters long short stretch compression wrap applied circumferentially using gradient layering techniques over single layer of  cotton stockinett an Rosidal foam- toes to popliteal fossa  Self-management Phase CDT includes: Appropriate daytime compression garments and HOS devices full time daily. Multiples needed  for hygiene. Replace q 3-6 months Elvarex, flat knit, ccl 225-32 mmHg) knee highs w open toe, t heel, oblique top edge, 2.5 cm wide SB on top, tricot patch at anterior ankle sewn on all 4 sides. To be work full time waking hours. Jobst RELAX knee high HOS device  Custom-made gradient compression garments and HOS devices are medically necessary in this case because they are uniquely sized and shaped to fit the exact dimensions of the affected extremities with deformities, and to provide accurate and consistent gradient compression and containment, essential to optimally managing this patient's symptoms of chronic, progressive lymphedema. Multiple custom compression garments are needed for optimal hygiene to limit infection risk. Custom compression garments should be replaced q 3-6 months When worn consistently for optimal lipo-lymphedema self-management over time.   ASSESSMENT:  CLINICAL IMPRESSION:  Existing garments are in fair condition and in need of replacement with same configurations, bilateral flat knit Elvarex classic, class 2 (23-32 mmHg).  Comparative limb volumetrics reveal R leg is increased in volume by 12.7%, and L leg is increased by 8.4% since last measured on 03/01/23. By visual assessment is also obvious that Pt has gained some weight, which is likely contributing to volume increases bilaterally. She tells me her activity level has decreased since her vision worsened . She gets ou less and drives only in familiar places. She tells me her doctor has not restricted her driving due to vision problems. OT encouraged Pt to be very careful.  Skin is well hydrated. Pt states she remains 100% compliant with all components of lymphedema self-care home program, including simple, self-mld, daily skin care, ther ex, and daily compression garments / devices.   Pt will benefit from custom compression garment replacement. Pt will return for measurement ASAP, then will return for fitting and self care edu once garments arrive.  OBJECTIVE IMPAIRMENTS: low vision, Abnormal gait, decreased activity tolerance, decreased balance, decreased endurance, decreased knowledge of condition, decreased knowledge of use of DME, decreased mobility, decreased ROM, increased edema, impaired flexibility, postural dysfunction, obesity, and pain.   ACTIVITY LIMITATIONS: functional mobility and ambulation (carrying, lifting, bending, sitting, standing, squatting, sleeping, stairs, transfers, bed mobility, continence, Basic and instrumental ADLs (bathing, dressing, hygiene/grooming, Productive activities ( caring for others) and leisure pursuits, body image, and social participation  PERSONAL FACTORS:  multiple co morbidities, length of time with progressive condition, are also affecting patient's functional outcome.   REHAB POTENTIAL: Fair    EVALUATION COMPLEXITY: Moderate   GOALS: Goals reviewed with patient? Yes  SHORT TERM GOALS: Target date: 4th OT Rx visit   Pt will demonstrate understanding of lymphedema precautions and prevention strategies with modified  independence using a printed reference to identify at least 5 precautions and discussing how s/he may implement them into daily life to reduce risk of progression with extra time (Modified Independence). Baseline:Max A Goal status: 12/07/22 GOAL MET  2.  Pt will be able to apply multilayer, thigh length, gradient, compression wraps to one leg at a time with modified assistance (extra time) to decrease limb volume, to limit infection risk, and to limit lymphedema progression.  Baseline: Dependent Goal status: 11/16/22 PROGRESSING 11/26/22 GOAL MET  LONG TERM GOALS: Target date: 02/01/23  Given this patient's Intake score of 63/100% on the functional outcomes FOTO tool, patient will experience an increase in function of 3  points to improve basic and instrumental ADLs performance, including lymphedema self-care.  Baseline: ONGOING   Goal status:12/07/22 GOAL ONGOING,  01/20/23 PROGRESSING 03/03/23 PROGRESSING; 04/12/23 PROGRESSING. 05/24/23 GOAL MET w/ score of 74%, revealing 11% increase in function since commencing OT.  2.  Given this patient's Intake score of 50.0 % on the Lymphedema Life Impact Scale (LLIS), patient will experience a reduction of at least 5 points in her perceived level of functional impairment resulting from lymphedema to improve functional performance and quality of life (QOL). Baseline: 50% Goal status: 12/07/22 GOAL ONGOING; 01/20/23 PROGRESSING,  03/03/23 PROGRESSING 04/12/23 PROGRESSING. 05/24/23 GOAL MET w/ score of 36.76%, revealing 13.24% reduction % in LE life impact since commencing OT.  3.  During Intensive phase CDT Pt will achieve at least 85% compliance with all lymphedema self-care home program components, including daily skin care, compression wraps and /or garments, simple self MLD and lymphatic pumping therex, and elevation when sitting to habituate LE self care protocol  into ADLs for optimal LE self-management over time. Baseline: Dependent Goal status: 12/08/22 ONGOING. Pt  not changing wraps daily as directed, but will work on increasing elevation anytime she is seated for next progress report. Pt will also increase frequency of re-wrapping her leg to ensure optimal volume reduction. GOAL MET 01/20/23  4.  Pt will achieve at least a 10% volume reduction below the knees to return limb to typical size and shape, to limit infection risk and LE progression, to decrease pain, to improve function. Baseline: dependent Goal status:  12/07/22 PROGRESSING 01/18/23 : Leg reduction= 8.67% since initial. Thigh  is Increased 10% since initial, And full LE is decreased by 0.018% since 11/11/22;   01/20/23 GOAL MET for R LEG w 9.96 % reduction  03/01/23 BLE comparative limb volumetrics reveal a  significant 15.15% increase in limb volume since last measured on 01/18/23. The LLE , the current Rx limb, is increased by 1.66% since initially measured in 4/24.   04/07/23 PROGRESSING with 7.9% reduction in the L leg since 03/01/23. See volumetric chart for additional details.  5.  Pt will obtain proper compression garments/compression braces and be modified independent with donning/doffing to optimize/stabilize with fluid reduction. Baseline:  Goal status: 12/07/22 PROGRESSING. 01/20/23 PROGRESSING.02/08/23 GOAL MET for RLE, 04/12/23 PROGRESSING for LLE. 05/24/23 GOAL MET for LLE.  PLAN:  OT FREQUENCY: Return for individual visits to measure for replacement compression garments/ devices, additional visit(s) for initial fitting and remakes, PRN.  OT DURATION: Ongoing and PRN for self-management support  PLANNED INTERVENTIONS: Therapeutic exercises, Therapeutic activity, Patient/Family education, Self Care, Manual lymph drainage, Compression bandaging, scar mobilization, Manual therapy, and skin care throughout MLD Compression garment replacements   PLAN FOR NEXT SESSION:  Complete anatomical measurements and custom compression garment specifications Pt edu for LE self care  Loel Dubonnet, MS,  OTR/L, CLT-LANA 09/01/23 11:41 AM

## 2023-09-04 ENCOUNTER — Other Ambulatory Visit: Payer: Self-pay | Admitting: Cardiology

## 2023-09-08 ENCOUNTER — Other Ambulatory Visit: Payer: Self-pay | Admitting: Family Medicine

## 2023-09-08 ENCOUNTER — Ambulatory Visit: Payer: Medicare Other | Admitting: Occupational Therapy

## 2023-09-08 DIAGNOSIS — M5432 Sciatica, left side: Secondary | ICD-10-CM

## 2023-09-21 DIAGNOSIS — I890 Lymphedema, not elsewhere classified: Secondary | ICD-10-CM | POA: Diagnosis not present

## 2023-09-22 ENCOUNTER — Ambulatory Visit: Payer: Medicare Other

## 2023-09-24 DIAGNOSIS — I890 Lymphedema, not elsewhere classified: Secondary | ICD-10-CM | POA: Diagnosis not present

## 2023-09-26 ENCOUNTER — Other Ambulatory Visit: Payer: Self-pay | Admitting: Cardiology

## 2023-09-28 DIAGNOSIS — H35352 Cystoid macular degeneration, left eye: Secondary | ICD-10-CM | POA: Diagnosis not present

## 2023-09-28 DIAGNOSIS — H20013 Primary iridocyclitis, bilateral: Secondary | ICD-10-CM | POA: Diagnosis not present

## 2023-09-28 DIAGNOSIS — H35351 Cystoid macular degeneration, right eye: Secondary | ICD-10-CM | POA: Diagnosis not present

## 2023-09-30 DIAGNOSIS — N2581 Secondary hyperparathyroidism of renal origin: Secondary | ICD-10-CM | POA: Diagnosis not present

## 2023-09-30 DIAGNOSIS — E1122 Type 2 diabetes mellitus with diabetic chronic kidney disease: Secondary | ICD-10-CM | POA: Diagnosis not present

## 2023-09-30 DIAGNOSIS — I890 Lymphedema, not elsewhere classified: Secondary | ICD-10-CM | POA: Diagnosis not present

## 2023-09-30 DIAGNOSIS — N1832 Chronic kidney disease, stage 3b: Secondary | ICD-10-CM | POA: Diagnosis not present

## 2023-09-30 DIAGNOSIS — D631 Anemia in chronic kidney disease: Secondary | ICD-10-CM | POA: Diagnosis not present

## 2023-09-30 DIAGNOSIS — I129 Hypertensive chronic kidney disease with stage 1 through stage 4 chronic kidney disease, or unspecified chronic kidney disease: Secondary | ICD-10-CM | POA: Diagnosis not present

## 2023-09-30 DIAGNOSIS — N189 Chronic kidney disease, unspecified: Secondary | ICD-10-CM | POA: Diagnosis not present

## 2023-10-10 ENCOUNTER — Other Ambulatory Visit: Payer: Self-pay | Admitting: Cardiology

## 2023-10-10 ENCOUNTER — Other Ambulatory Visit: Payer: Self-pay | Admitting: Family Medicine

## 2023-10-10 DIAGNOSIS — I10 Essential (primary) hypertension: Secondary | ICD-10-CM

## 2023-10-14 ENCOUNTER — Other Ambulatory Visit: Payer: Self-pay | Admitting: Family Medicine

## 2023-10-19 ENCOUNTER — Other Ambulatory Visit: Payer: Self-pay | Admitting: Family Medicine

## 2023-10-19 DIAGNOSIS — Z1231 Encounter for screening mammogram for malignant neoplasm of breast: Secondary | ICD-10-CM

## 2023-10-21 DIAGNOSIS — M1711 Unilateral primary osteoarthritis, right knee: Secondary | ICD-10-CM | POA: Diagnosis not present

## 2023-10-26 DIAGNOSIS — H35352 Cystoid macular degeneration, left eye: Secondary | ICD-10-CM | POA: Diagnosis not present

## 2023-10-26 DIAGNOSIS — H40053 Ocular hypertension, bilateral: Secondary | ICD-10-CM | POA: Diagnosis not present

## 2023-10-26 DIAGNOSIS — H35351 Cystoid macular degeneration, right eye: Secondary | ICD-10-CM | POA: Diagnosis not present

## 2023-10-26 DIAGNOSIS — H20013 Primary iridocyclitis, bilateral: Secondary | ICD-10-CM | POA: Diagnosis not present

## 2023-11-04 ENCOUNTER — Ambulatory Visit: Payer: Medicare Other | Admitting: Family Medicine

## 2023-11-05 ENCOUNTER — Ambulatory Visit
Admission: RE | Admit: 2023-11-05 | Discharge: 2023-11-05 | Disposition: A | Discharge status: Home / Self Care | Source: Ambulatory Visit | Attending: Family Medicine | Admitting: Family Medicine

## 2023-11-05 DIAGNOSIS — Z1231 Encounter for screening mammogram for malignant neoplasm of breast: Secondary | ICD-10-CM | POA: Diagnosis not present

## 2023-11-08 ENCOUNTER — Other Ambulatory Visit: Payer: Self-pay | Admitting: Family Medicine

## 2023-11-08 ENCOUNTER — Encounter: Payer: Self-pay | Admitting: Family Medicine

## 2023-11-08 ENCOUNTER — Other Ambulatory Visit (HOSPITAL_COMMUNITY): Payer: Self-pay

## 2023-11-08 MED ORDER — OXYBUTYNIN CHLORIDE ER 10 MG PO TB24
10.0000 mg | ORAL_TABLET | Freq: Every day | ORAL | 3 refills | Status: DC
  Filled 2023-11-08 (×2): qty 90, 90d supply, fill #0

## 2023-11-08 MED ORDER — OXYBUTYNIN CHLORIDE ER 10 MG PO TB24: 10.0000 mg | ORAL_TABLET | Freq: Every day | ORAL | 3 refills | Status: AC

## 2023-11-29 DIAGNOSIS — H35351 Cystoid macular degeneration, right eye: Secondary | ICD-10-CM | POA: Diagnosis not present

## 2023-11-29 DIAGNOSIS — H20013 Primary iridocyclitis, bilateral: Secondary | ICD-10-CM | POA: Diagnosis not present

## 2023-11-29 DIAGNOSIS — H02403 Unspecified ptosis of bilateral eyelids: Secondary | ICD-10-CM | POA: Diagnosis not present

## 2023-11-29 DIAGNOSIS — H35352 Cystoid macular degeneration, left eye: Secondary | ICD-10-CM | POA: Diagnosis not present

## 2023-12-05 ENCOUNTER — Other Ambulatory Visit: Payer: Self-pay | Admitting: Cardiology

## 2023-12-07 ENCOUNTER — Encounter (INDEPENDENT_AMBULATORY_CARE_PROVIDER_SITE_OTHER): Payer: Self-pay

## 2023-12-20 NOTE — Progress Notes (Deleted)
 [facility] at [facility] [address] [place], [place] [address] [phone]  Date:  12/22/2023   Name:  [name]   DOB:  18-Apr-1951   MRN:  [id number]  PCP:  Kaylee Partridge, MD    Chief Complaint: No chief complaint on file.   History of Present Illness:  Emily Wood is a 73 y.o. very pleasant female patient who presents with the following:  Patient seen today for periodic follow-up.  Last recent visit with myself was in January  History of diabetes, chronic kidney disease, hypertension, venous insufficiency and lymphedema, obesity, Graves' hyperthyroidism in remission, CAD seen on coronary imaging She attends lymphedema clinic regularly to manage her swelling She follows up with nephrology, Dr. Lonnie Roberts recent visit was in [place], recheck 6 months Endocrinologist is Dr. Lorelei Rogers with the [facility] clinic in [place]-  Cardiologist is Dr. Emmette Harms  Diabetes is treated with Ozempic  1 mg only at this time  Lab work on chart from January, CMP, CBC, urine micro  Cologuard completed last year Due for foot exam Needs A1c update Recommend COVID booster, recommend Shingrix Patient Active Problem List   Diagnosis Date Noted   Sinus bradycardia 05/14/2023   PVC (premature ventricular contraction) 05/14/2023   Diabetes mellitus due to underlying condition with hyperosmolarity without coma, without long-term current use of insulin  (HCC) 05/14/2023   Full thickness rotator cuff tear 07/24/2022   Normocytic anemia 05/23/2022   AKI (acute kidney injury) (HCC) 05/21/2022   Snoring 11/05/2021   Daytime somnolence 11/05/2021   Lipoma 10/17/2021   Subacromial bursitis of left shoulder joint 10/17/2021   Coronary artery disease involving native coronary artery of native heart without angina pectoris 05/15/2021   Nonrheumatic tricuspid valve regurgitation 05/15/2021   Pulmonary hypertension, unspecified (HCC)  05/15/2021   Mixed hyperlipidemia 02/20/2021   Obesity (BMI 30-39.9) 02/20/2021   Diabetic retinopathy (HCC) 08/28/2020   Mixed incontinence 05/31/2020   Graves disease 04/14/2020   Right renal mass 11/22/2019   Arthritis of left knee 09/25/2019   Lymphedema 01/25/2017   Fibroid uterus 01/14/2017   Chronic kidney disease, stage 3 (HCC) 09/21/2016   Meralgia paresthetica of left side 08/27/2015   Diabetes mellitus type 2, controlled (HCC) 08/02/2015   Chronic venous insufficiency 06/28/2015   Essential hypertension 02/15/2015   Peripheral edema 02/15/2015   Chronic knee pain 02/15/2015    Past Medical History:  Diagnosis Date   Arthritis    Diabetes mellitus without complication (HCC)    Family history of adverse reaction to anesthesia    PONV   Graves disease 04/14/2020   Hypertension    Lymphedema    Mixed hyperlipidemia 02/20/2021    Past Surgical History:  Procedure Laterality Date   CATARACT EXTRACTION W/PHACO Right 04/14/2022   Procedure: CATARACT EXTRACTION PHACO AND INTRAOCULAR LENS PLACEMENT (IOC) RIGHT DIABETIC 10.93 00:53.0;  Surgeon: Clair Crews, MD;  Location: [facility];  Service: Ophthalmology;  Laterality: Right;   CATARACT EXTRACTION W/PHACO Left 04/28/2022   Procedure: CATARACT EXTRACTION PHACO AND INTRAOCULAR LENS PLACEMENT (IOC) LEFT DIABETIC 6.05 00:45.7;  Surgeon: Clair Crews, MD;  Location: [facility];  Service: Ophthalmology;  Laterality: Left;  Diabetic   CHOLECYSTECTOMY     HERNIA REPAIR     OTHER SURGICAL HISTORY     scar tissue removal from bowels   PARTIAL HYSTERECTOMY     Still has ovaries   REVERSE SHOULDER ARTHROPLASTY Right 09/11/2022   Procedure: REVERSE SHOULDER  ARTHROPLASTY;  Surgeon: Janeth Medicus, MD;  Location: [facility];  Service: Orthopedics;  Laterality: Right;  120   UTERINE FIBROID SURGERY      Social History   Tobacco Use   Smoking status: Never   Smokeless tobacco: Never  Vaping Use    Vaping status: Never Used  Substance Use Topics   Alcohol  use: No    Alcohol /week: 0.0 standard drinks of alcohol     Comment: rarely   Drug use: No    Family History  Problem Relation Age of Onset   Hyperlipidemia Mother    Cancer Sister    Hyperlipidemia Maternal Aunt    Diabetes Maternal Aunt    Breast cancer Cousin    Diabetes Maternal Uncle     No Known Allergies  Medication list has been reviewed and updated.  Current Outpatient Medications on File Prior to Visit  Medication Sig Dispense Refill   Accu-Chek Softclix Lancets lancets CHECK BLOOD SUGAR ONCE DAILY 100 each 2   Alpha-Lipoic Acid 300 MG CAPS Take 600 mg by mouth daily.     amLODipine  (NORVASC ) 10 MG tablet Take 1 tablet (10 mg total) by mouth daily. 90 tablet 3   Ascorbic Acid (VITAMIN C) 1000 MG tablet Take 1,000 mg by mouth daily.     aspirin  81 MG tablet Take 81 mg by mouth daily.     atorvastatin  (LIPITOR) 40 MG tablet TAKE 1 TABLET BY MOUTH DAILY 100 tablet 2   Biotin w/ Vitamins C & E (HAIR/SKIN/NAILS PO) Take 6,000 mcg by mouth daily.     Blood Pressure Monitor MISC Use to check blood pressure daily.  Please include a large cuff     carvedilol  (COREG ) 12.5 MG tablet TAKE 1 TABLET BY MOUTH TWICE A DAY 180 tablet 2   Cholecalciferol (VITAMIN D3) 125 MCG (5000 UT) CAPS Take 1 capsule (5,000 Units total) by mouth daily.     cloNIDine  (CATAPRES ) 0.2 MG tablet Take 1 tablet (0.2 mg total) by mouth 3 (three) times daily. 270 tablet 3   Cobalamin Combinations (B-12) [phone] MCG SUBL Place 5,000 mcg under the tongue daily.     diclofenac  Sodium (VOLTAREN ) 1 % GEL Apply 4 g topically 4 (four) times daily. (Patient taking differently: Apply 4 g topically daily as needed.) 100 g 0   ferrous sulfate  324 MG TBEC Take 324 mg by mouth daily.     gabapentin  (NEURONTIN ) 400 MG capsule TAKE 2 CAPSULES BY MOUTH TWICE  DAILY 400 capsule 2   glucose blood (ACCU-CHEK GUIDE) test strip CHECK BLOOD SUGAR ONCE DAILY 100 strip 2    irbesartan  (AVAPRO ) 300 MG tablet TAKE 1 TABLET BY MOUTH DAILY 100 tablet 2   isosorbide  mononitrate (IMDUR ) 30 MG 24 hr tablet TAKE 1 TABLET BY MOUTH DAILY 100 tablet 3   methimazole  (TAPAZOLE ) 5 MG tablet Take 2.5 mg by mouth daily.     Multiple Vitamin (MULTIVITAMIN ADULT PO) Take 2 tablets by mouth daily.     nystatin  cream (MYCOSTATIN ) Apply 1 application topically 2 (two) times daily. (Patient taking differently: Apply 1 application  topically 2 (two) times daily as needed for dry skin.) 30 g 1   oxybutynin  (DITROPAN -XL) 10 MG 24 hr tablet Take 1 tablet (10 mg total) by mouth at bedtime. 90 tablet 3   potassium chloride  SA (KLOR-CON  M) 20 MEQ tablet TAKE 1 TABLET BY MOUTH DAILY 90 tablet 3   prednisoLONE  acetate (PRED FORTE ) 1 % ophthalmic suspension Place 1 drop into both  eyes 4 (four) times daily. 5 mL 0   Semaglutide , 1 MG/DOSE, 4 MG/3ML SOPN Inject 1 mg into the skin once a week. 3 mL 1   torsemide  (DEMADEX ) 10 MG tablet TAKE 1 TABLET (10 MG TOTAL) BY MOUTH DAILY. USE AS NEEDED FOR LOWER EXTREMITY SWELLING 90 tablet 0   Turmeric 500 MG CAPS Take 500 mg by mouth daily.     No current facility-administered medications on file prior to visit.    Review of Systems:  As per HPI- otherwise negative.   Physical Examination: There were no vitals filed for this visit. There were no vitals filed for this visit. There is no height or weight on file to calculate BMI. Ideal Body Weight:    GEN: no acute distress. HEENT: Atraumatic, Normocephalic.  Ears and Nose: No external deformity. CV: RRR, No M/G/R. No JVD. No thrill. No extra heart sounds. PULM: CTA B, no wheezes, crackles, rhonchi. No retractions. No resp. distress. No accessory muscle use. ABD: S, NT, ND, +BS. No rebound. No HSM. EXTR: No c/c/e PSYCH: Normally interactive. Conversant.  Foot exam  Assessment and Plan: ***  Signed Gates Kasal, MD

## 2023-12-20 NOTE — Patient Instructions (Incomplete)
 It was good to see you again today I do recommend getting the shingles vaccine series, also be sure to keep your COVID boosters up-to-date Finally, I would recommend a one-time dose of RSV vaccine

## 2023-12-22 ENCOUNTER — Ambulatory Visit: Admitting: Family Medicine

## 2023-12-23 DIAGNOSIS — H02403 Unspecified ptosis of bilateral eyelids: Secondary | ICD-10-CM | POA: Diagnosis not present

## 2023-12-23 DIAGNOSIS — H5340 Unspecified visual field defects: Secondary | ICD-10-CM | POA: Diagnosis not present

## 2024-01-05 NOTE — Patient Instructions (Addendum)
 Good to see you again today!  Recommend keeping your covid boosters up to date  Recommend shingrix series at your convenience  I will be in touch with your labs We will check on your A1c and potentially change you from ozempic  to mounjaro to assist with appetite control

## 2024-01-05 NOTE — Progress Notes (Addendum)
 [facility] at [facility] [address] [place], [place] [address] [id number] [phone]  Date:  01/12/2024   Name:  Emily Wood   DOB:  1950/10/06   MRN:  [id number]  PCP:  Watt Harlene BROCKS, MD    Chief Complaint: Follow-up (Pt states she has gained so much weight )   History of Present Illness:  Emily Wood is a 73 y.o. very pleasant female patient who presents with the following:  Patient seen today for periodic follow-up.  Last recent visit with myself was in January  History of diabetes, chronic kidney disease, hypertension, venous insufficiency and lymphedema, obesity, Grave's hyperthyroidism in remission, CAD seen on coronary imaging She attends lymphedema clinic regularly to manage her swelling She follows up with nephrology, Dr. Ginette recent visit was in [place], recheck 6 months Endocrinologist is Dr. Damian with the [facility] clinic in [place]- she has a visit in about a week Cardiologist is Dr. Sheena  Diabetes is treated with Ozempic  1 mg only at this time Lipitor Amlodipine   Clonidine  Gabapentin  Imdur  Methoimazole- she is taking this  Potassium 20meq daily  Torsemide  prn   Lab work on chart from January, CMP, CBC, urine micro  Cologuard completed last year Due for foot exam Needs A1c update Recommend COVID booster, recommend Shingrix  Pt notes she has gained weight due to poor diet- she feels like she is sabotaging herself due to fear of getting knee surgery She has been told her knee is bone-on-bone and that surgery will eventually be necessary.  She knows that she needs knee surgery but fears she is keeping her weight high so it will not be possible for her.  Wt Readings from Last 3 Encounters:  01/12/24 249 lb 12.8 oz (113.3 kg)  08/23/23 227 lb (103 kg)  07/26/23 227 lb 9.6 oz (103.2 kg)    Patient Active Problem List   Diagnosis Date Noted   Sinus bradycardia 05/14/2023   PVC  (premature ventricular contraction) 05/14/2023   Diabetes mellitus due to underlying condition with hyperosmolarity without coma, without long-term current use of insulin  (HCC) 05/14/2023   Full thickness rotator cuff tear 07/24/2022   Normocytic anemia 05/23/2022   AKI (acute kidney injury) (HCC) 05/21/2022   Snoring 11/05/2021   Daytime somnolence 11/05/2021   Lipoma 10/17/2021   Subacromial bursitis of left shoulder joint 10/17/2021   Coronary artery disease involving native coronary artery of native heart without angina pectoris 05/15/2021   Nonrheumatic tricuspid valve regurgitation 05/15/2021   Pulmonary hypertension, unspecified (HCC) 05/15/2021   Mixed hyperlipidemia 02/20/2021   Obesity (BMI 30-39.9) 02/20/2021   Diabetic retinopathy (HCC) 08/28/2020   Mixed incontinence 05/31/2020   Graves disease 04/14/2020   Right renal mass 11/22/2019   Arthritis of left knee 09/25/2019   Lymphedema 01/25/2017   Fibroid uterus 01/14/2017   Chronic kidney disease, stage 3 (HCC) 09/21/2016   Meralgia paresthetica of left side 08/27/2015   Diabetes mellitus type 2, controlled (HCC) 08/02/2015   Chronic venous insufficiency 06/28/2015   Essential hypertension 02/15/2015   Peripheral edema 02/15/2015   Chronic knee pain 02/15/2015    Past Medical History:  Diagnosis Date   Arthritis    Diabetes mellitus without complication (HCC)    Family history of adverse reaction to anesthesia    PONV   Graves disease 04/14/2020   Hypertension    Lymphedema    Mixed hyperlipidemia 02/20/2021    Past Surgical History:  Procedure Laterality  Date   CATARACT EXTRACTION W/PHACO Right 04/14/2022   Procedure: CATARACT EXTRACTION PHACO AND INTRAOCULAR LENS PLACEMENT (IOC) RIGHT DIABETIC 10.93 00:53.0;  Surgeon: Jaye Fallow, MD;  Location: [facility];  Service: Ophthalmology;  Laterality: Right;   CATARACT EXTRACTION W/PHACO Left 04/28/2022   Procedure: CATARACT EXTRACTION PHACO AND  INTRAOCULAR LENS PLACEMENT (IOC) LEFT DIABETIC 6.05 00:45.7;  Surgeon: Jaye Fallow, MD;  Location: [facility];  Service: Ophthalmology;  Laterality: Left;  Diabetic   CHOLECYSTECTOMY     HERNIA REPAIR     OTHER SURGICAL HISTORY     scar tissue removal from bowels   PARTIAL HYSTERECTOMY     Still has ovaries   REVERSE SHOULDER ARTHROPLASTY Right 09/11/2022   Procedure: REVERSE SHOULDER ARTHROPLASTY;  Surgeon: Sharl Selinda Dover, MD;  Location: [facility];  Service: Orthopedics;  Laterality: Right;  120   UTERINE FIBROID SURGERY      Social History   Tobacco Use   Smoking status: Never   Smokeless tobacco: Never  Vaping Use   Vaping status: Never Used  Substance Use Topics   Alcohol  use: No    Alcohol /week: 0.0 standard drinks of alcohol     Comment: rarely   Drug use: No    Family History  Problem Relation Age of Onset   Hyperlipidemia Mother    Cancer Sister    Hyperlipidemia Maternal Aunt    Diabetes Maternal Aunt    Breast cancer Cousin    Diabetes Maternal Uncle     No Known Allergies  Medication list has been reviewed and updated.  Current Outpatient Medications on File Prior to Visit  Medication Sig Dispense Refill   Accu-Chek Softclix Lancets lancets CHECK BLOOD SUGAR ONCE DAILY 100 each 2   Alpha-Lipoic Acid 300 MG CAPS Take 600 mg by mouth daily.     amLODipine  (NORVASC ) 10 MG tablet Take 1 tablet (10 mg total) by mouth daily. 90 tablet 3   Ascorbic Acid (VITAMIN C) 1000 MG tablet Take 1,000 mg by mouth daily.     aspirin  81 MG tablet Take 81 mg by mouth daily.     atorvastatin  (LIPITOR) 40 MG tablet TAKE 1 TABLET BY MOUTH DAILY 100 tablet 2   Biotin w/ Vitamins C & E (HAIR/SKIN/NAILS PO) Take 6,000 mcg by mouth daily.     Blood Pressure Monitor MISC Use to check blood pressure daily.  Please include a large cuff     carvedilol  (COREG ) 12.5 MG tablet TAKE 1 TABLET BY MOUTH TWICE A DAY 180 tablet 2   Cholecalciferol (VITAMIN D3) 125 MCG (5000 UT)  CAPS Take 1 capsule (5,000 Units total) by mouth daily.     cloNIDine  (CATAPRES ) 0.2 MG tablet Take 1 tablet (0.2 mg total) by mouth 3 (three) times daily. 270 tablet 3   Cobalamin Combinations (B-12) [phone] MCG SUBL Place 5,000 mcg under the tongue daily.     diclofenac  Sodium (VOLTAREN ) 1 % GEL Apply 4 g topically 4 (four) times daily. (Patient taking differently: Apply 4 g topically daily as needed.) 100 g 0   ferrous sulfate  324 MG TBEC Take 324 mg by mouth daily.     gabapentin  (NEURONTIN ) 400 MG capsule TAKE 2 CAPSULES BY MOUTH TWICE  DAILY 400 capsule 2   glucose blood (ACCU-CHEK GUIDE) test strip CHECK BLOOD SUGAR ONCE DAILY 100 strip 2   irbesartan  (AVAPRO ) 300 MG tablet TAKE 1 TABLET BY MOUTH DAILY 100 tablet 2   isosorbide  mononitrate (IMDUR ) 30 MG 24 hr tablet TAKE  1 TABLET BY MOUTH DAILY 100 tablet 3   methimazole  (TAPAZOLE ) 5 MG tablet Take 2.5 mg by mouth daily.     Multiple Vitamin (MULTIVITAMIN ADULT PO) Take 2 tablets by mouth daily.     nystatin  cream (MYCOSTATIN ) Apply 1 application topically 2 (two) times daily. (Patient taking differently: Apply 1 application  topically 2 (two) times daily as needed for dry skin.) 30 g 1   oxybutynin  (DITROPAN -XL) 10 MG 24 hr tablet Take 1 tablet (10 mg total) by mouth at bedtime. 90 tablet 3   potassium chloride  SA (KLOR-CON  M) 20 MEQ tablet TAKE 1 TABLET BY MOUTH DAILY 90 tablet 3   prednisoLONE  acetate (PRED FORTE ) 1 % ophthalmic suspension Place 1 drop into both eyes 4 (four) times daily. 5 mL 0   Semaglutide , 1 MG/DOSE, 4 MG/3ML SOPN Inject 1 mg into the skin once a week. 3 mL 1   torsemide  (DEMADEX ) 10 MG tablet TAKE 1 TABLET (10 MG TOTAL) BY MOUTH DAILY. USE AS NEEDED FOR LOWER EXTREMITY SWELLING 90 tablet 0   Turmeric 500 MG CAPS Take 500 mg by mouth daily.     No current facility-administered medications on file prior to visit.    Review of Systems:  As per HPI- otherwise negative.   Physical Examination: Vitals:    01/12/24 0914  BP: 128/78  Pulse: 62  SpO2: 98%   Vitals:   01/12/24 0914  Weight: 249 lb 12.8 oz (113.3 kg)  Height: 5' 5 (1.651 m)   Body mass index is 41.57 kg/m. Ideal Body Weight: Weight in (lb) to have BMI = 25: 149.9  GEN: no acute distress. Obese, looks well  HEENT: Atraumatic, Normocephalic.  Ears and Nose: No external deformity. CV: RRR, No M/G/R. No JVD. No thrill. No extra heart sounds. PULM: CTA B, no wheezes, crackles, rhonchi. No retractions. No resp. distress. No accessory muscle use. ABD: S, NT, ND, +BS. No rebound. No HSM. EXTR: No c/c/chronic bilateral lower extremity edema which is stable.  Wearing her compression socks PSYCH: Normally interactive. Conversant.  Foot exam- updated today   Assessment and Plan: Stage 3a chronic kidney disease (HCC) - Plan: Comprehensive metabolic panel with GFR  Controlled type 2 diabetes mellitus without complication, without long-term current use of insulin  (HCC) - Plan: Comprehensive metabolic panel with GFR, Hemoglobin A1c, Lipid panel  Essential hypertension - Plan: CBC, Comprehensive metabolic panel with GFR  Peripheral edema  Lymphedema  Diabetic retinopathy associated with type 2 diabetes mellitus, macular edema presence unspecified, unspecified laterality, unspecified retinopathy severity (HCC)  Chronic anemia - Plan: CBC, Ferritin  Hyperthyroidism - Plan: TSH, T3, free  Patient seen today for follow-up; lab work is pending as above.  Discussed her GLP-1 and weight gain.  Suggested that she might switch to Mounjaro in hopes of suppressing her appetite and helping her with weight loss.   Peripheral edema is stable, we will check her potassium She plans to see endocrinology in about a week, will check her thyroid  levels for her today. Blood pressure is under good control Lymphedema is stable Will plan further follow- up pending labs.   Will fax labs to her endocrinologist once received - [facility]   Signed Harlene Schroeder, MD  Received labs as below, message to patient  Results for orders placed or performed in visit on 01/12/24  CBC   Collection Time: 01/12/24  9:39 AM  Result Value Ref Range   WBC 5.9 4.0 - 10.5 K/uL   RBC 3.68 (L)  3.87 - 5.11 Mil/uL   Platelets 273.0 150.0 - 400.0 K/uL   Hemoglobin 10.6 (L) 12.0 - 15.0 g/dL   HCT 68.0 (L) 63.9 - 53.9 %   MCV 86.7 78.0 - 100.0 fl   MCHC 33.2 30.0 - 36.0 g/dL   RDW 85.6 88.4 - 84.4 %  Comprehensive metabolic panel with GFR   Collection Time: 01/12/24  9:39 AM  Result Value Ref Range   Sodium 138 135 - 145 mEq/L   Potassium 4.0 3.5 - 5.1 mEq/L   Chloride 100 96 - 112 mEq/L   CO2 30 19 - 32 mEq/L   Glucose, Bld 111 (H) 70 - 99 mg/dL   BUN 19 6 - 23 mg/dL   Creatinine, Ser 8.37 (H) 0.40 - 1.20 mg/dL   Total Bilirubin 1.0 0.2 - 1.2 mg/dL   Alkaline Phosphatase 131 (H) 39 - 117 U/L   AST 24 0 - 37 U/L   ALT 14 0 - 35 U/L   Total Protein 8.3 6.0 - 8.3 g/dL   Albumin 4.1 3.5 - 5.2 g/dL   GFR 68.33 (L) >39.99 mL/min   Calcium  9.6 8.4 - 10.5 mg/dL  Hemoglobin J8r   Collection Time: 01/12/24  9:39 AM  Result Value Ref Range   Hgb A1c MFr Bld 6.8 (H) 4.6 - 6.5 %  Lipid panel   Collection Time: 01/12/24  9:39 AM  Result Value Ref Range   Cholesterol 144 0 - 200 mg/dL   Triglycerides 31.9 0.0 - 149.0 mg/dL   HDL 38.99 >60.99 mg/dL   VLDL 86.3 0.0 - 59.9 mg/dL   LDL Cholesterol 70 0 - 99 mg/dL   Total CHOL/HDL Ratio 2    NonHDL 83.43   Ferritin   Collection Time: 01/12/24  9:39 AM  Result Value Ref Range   Ferritin 52.7 10.0 - 291.0 ng/mL  TSH   Collection Time: 01/12/24  9:39 AM  Result Value Ref Range   TSH 4.49 0.35 - 5.50 uIU/mL  T3, free   Collection Time: 01/12/24  9:39 AM  Result Value Ref Range   T3, Free 3.4 2.3 - 4.2 pg/mL

## 2024-01-06 ENCOUNTER — Encounter: Payer: Self-pay | Admitting: Family Medicine

## 2024-01-10 DIAGNOSIS — H20013 Primary iridocyclitis, bilateral: Secondary | ICD-10-CM | POA: Diagnosis not present

## 2024-01-10 DIAGNOSIS — H35353 Cystoid macular degeneration, bilateral: Secondary | ICD-10-CM | POA: Diagnosis not present

## 2024-01-12 ENCOUNTER — Ambulatory Visit: Admitting: Family Medicine

## 2024-01-12 ENCOUNTER — Encounter: Payer: Self-pay | Admitting: Family Medicine

## 2024-01-12 ENCOUNTER — Ambulatory Visit: Payer: Self-pay | Admitting: Family Medicine

## 2024-01-12 VITALS — BP 128/78 | HR 62 | Ht 65.0 in | Wt 249.8 lb

## 2024-01-12 DIAGNOSIS — I890 Lymphedema, not elsewhere classified: Secondary | ICD-10-CM | POA: Diagnosis not present

## 2024-01-12 DIAGNOSIS — E119 Type 2 diabetes mellitus without complications: Secondary | ICD-10-CM

## 2024-01-12 DIAGNOSIS — N1831 Chronic kidney disease, stage 3a: Secondary | ICD-10-CM

## 2024-01-12 DIAGNOSIS — R600 Localized edema: Secondary | ICD-10-CM | POA: Diagnosis not present

## 2024-01-12 DIAGNOSIS — E0590 Thyrotoxicosis, unspecified without thyrotoxic crisis or storm: Secondary | ICD-10-CM

## 2024-01-12 DIAGNOSIS — Z7985 Long-term (current) use of injectable non-insulin antidiabetic drugs: Secondary | ICD-10-CM

## 2024-01-12 DIAGNOSIS — E11319 Type 2 diabetes mellitus with unspecified diabetic retinopathy without macular edema: Secondary | ICD-10-CM | POA: Diagnosis not present

## 2024-01-12 DIAGNOSIS — D649 Anemia, unspecified: Secondary | ICD-10-CM | POA: Diagnosis not present

## 2024-01-12 DIAGNOSIS — I10 Essential (primary) hypertension: Secondary | ICD-10-CM

## 2024-01-12 LAB — T3, FREE: T3, Free: 3.4 pg/mL (ref 2.3–4.2)

## 2024-01-12 LAB — COMPREHENSIVE METABOLIC PANEL WITH GFR
ALT: 14 U/L (ref 0–35)
AST: 24 U/L (ref 0–37)
Albumin: 4.1 g/dL (ref 3.5–5.2)
Alkaline Phosphatase: 131 U/L — ABNORMAL HIGH (ref 39–117)
BUN: 19 mg/dL (ref 6–23)
CO2: 30 meq/L (ref 19–32)
Calcium: 9.6 mg/dL (ref 8.4–10.5)
Chloride: 100 meq/L (ref 96–112)
Creatinine, Ser: 1.62 mg/dL — ABNORMAL HIGH (ref 0.40–1.20)
GFR: 31.66 mL/min — ABNORMAL LOW (ref >60.00)
Glucose, Bld: 111 mg/dL — ABNORMAL HIGH (ref 70–99)
Potassium: 4 meq/L (ref 3.5–5.1)
Sodium: 138 meq/L (ref 135–145)
Total Bilirubin: 1 mg/dL (ref 0.2–1.2)
Total Protein: 8.3 g/dL (ref 6.0–8.3)

## 2024-01-12 LAB — LIPID PANEL
Cholesterol: 144 mg/dL (ref 0–200)
HDL: 61 mg/dL (ref >39.00)
LDL Cholesterol: 70 mg/dL (ref 0–99)
NonHDL: 83.43
Total CHOL/HDL Ratio: 2
Triglycerides: 68 mg/dL (ref 0.0–149.0)
VLDL: 13.6 mg/dL (ref 0.0–40.0)

## 2024-01-12 LAB — HEMOGLOBIN A1C: Hgb A1c MFr Bld: 6.8 % — ABNORMAL HIGH (ref 4.6–6.5)

## 2024-01-12 LAB — CBC
HCT: 31.9 % — ABNORMAL LOW (ref 36.0–46.0)
Hemoglobin: 10.6 g/dL — ABNORMAL LOW (ref 12.0–15.0)
MCHC: 33.2 g/dL (ref 30.0–36.0)
MCV: 86.7 fl (ref 78.0–100.0)
Platelets: 273 10*3/uL (ref 150.0–400.0)
RBC: 3.68 Mil/uL — ABNORMAL LOW (ref 3.87–5.11)
RDW: 14.3 % (ref 11.5–15.5)
WBC: 5.9 10*3/uL (ref 4.0–10.5)

## 2024-01-12 LAB — TSH: TSH: 4.49 u[IU]/mL (ref 0.35–5.50)

## 2024-01-12 LAB — FERRITIN: Ferritin: 52.7 ng/mL (ref 10.0–291.0)

## 2024-01-13 ENCOUNTER — Other Ambulatory Visit: Payer: Self-pay | Admitting: Cardiology

## 2024-01-20 DIAGNOSIS — I10 Essential (primary) hypertension: Secondary | ICD-10-CM | POA: Diagnosis not present

## 2024-01-20 DIAGNOSIS — R809 Proteinuria, unspecified: Secondary | ICD-10-CM | POA: Diagnosis not present

## 2024-01-20 DIAGNOSIS — N1832 Chronic kidney disease, stage 3b: Secondary | ICD-10-CM | POA: Diagnosis not present

## 2024-01-20 DIAGNOSIS — E1169 Type 2 diabetes mellitus with other specified complication: Secondary | ICD-10-CM | POA: Diagnosis not present

## 2024-01-20 DIAGNOSIS — E1129 Type 2 diabetes mellitus with other diabetic kidney complication: Secondary | ICD-10-CM | POA: Diagnosis not present

## 2024-01-20 DIAGNOSIS — E1122 Type 2 diabetes mellitus with diabetic chronic kidney disease: Secondary | ICD-10-CM | POA: Diagnosis not present

## 2024-01-20 DIAGNOSIS — E0500 Thyrotoxicosis with diffuse goiter without thyrotoxic crisis or storm: Secondary | ICD-10-CM | POA: Diagnosis not present

## 2024-01-31 ENCOUNTER — Encounter (HOSPITAL_BASED_OUTPATIENT_CLINIC_OR_DEPARTMENT_OTHER): Payer: Self-pay | Admitting: *Deleted

## 2024-02-01 ENCOUNTER — Encounter: Payer: Self-pay | Admitting: Cardiology

## 2024-02-01 ENCOUNTER — Ambulatory Visit: Attending: Cardiology | Admitting: Cardiology

## 2024-02-01 ENCOUNTER — Other Ambulatory Visit (HOSPITAL_COMMUNITY): Payer: Self-pay

## 2024-02-01 VITALS — BP 174/80 | HR 58 | Ht 66.0 in | Wt 247.8 lb

## 2024-02-01 DIAGNOSIS — Z794 Long term (current) use of insulin: Secondary | ICD-10-CM | POA: Diagnosis not present

## 2024-02-01 DIAGNOSIS — E119 Type 2 diabetes mellitus without complications: Secondary | ICD-10-CM | POA: Diagnosis not present

## 2024-02-01 DIAGNOSIS — E782 Mixed hyperlipidemia: Secondary | ICD-10-CM | POA: Diagnosis not present

## 2024-02-01 DIAGNOSIS — I10 Essential (primary) hypertension: Secondary | ICD-10-CM

## 2024-02-01 DIAGNOSIS — I2510 Atherosclerotic heart disease of native coronary artery without angina pectoris: Secondary | ICD-10-CM | POA: Diagnosis not present

## 2024-02-01 DIAGNOSIS — I119 Hypertensive heart disease without heart failure: Secondary | ICD-10-CM | POA: Diagnosis not present

## 2024-02-01 MED ORDER — TORSEMIDE 10 MG PO TABS
10.0000 mg | ORAL_TABLET | Freq: Every day | ORAL | 3 refills | Status: AC
  Filled 2024-02-01 – 2024-05-08 (×2): qty 90, 90d supply, fill #0
  Filled 2024-08-02: qty 90, 90d supply, fill #1

## 2024-02-01 MED ORDER — ISOSORBIDE MONONITRATE ER 60 MG PO TB24
60.0000 mg | ORAL_TABLET | Freq: Every day | ORAL | 3 refills | Status: DC
  Filled 2024-02-01 – 2024-05-08 (×2): qty 90, 90d supply, fill #0

## 2024-02-01 NOTE — Patient Instructions (Addendum)
 Medication Instructions:  Your physician has recommended you make the following change in your medication:  INCREASE: Imdur  60 mg once daily  *If you need a refill on your cardiac medications before your next appointment, please call your pharmacy*  Follow-Up: At [facility], you and your health needs are our priority.  As part of our continuing mission to provide you with exceptional heart care, our providers are all part of one team.  This team includes your primary Cardiologist (physician) and Advanced Practice Providers or APPs (Physician Assistants and Nurse Practitioners) who all work together to provide you with the care you need, when you need it.  Your next appointment:   4 month(s)  Provider:   Kardie Tobb, DO    Other Instructions Please see our pharmacy staff in 4 weeks.  Please bring your blood pressure readings with you to that appointment. Please take your blood pressure medication 2 hours before pharmacy appointment and appointment with Dr. Sheena.

## 2024-02-01 NOTE — Progress Notes (Signed)
 Cardiology Office Note:    Date:  02/01/2024   ID:  Emily Wood, DOB May 28, 1951, MRN [id number]  PCP:  Watt Harlene BROCKS, MD  Cardiologist:  Dub Huntsman, DO  Electrophysiologist:  None   Referring MD: Watt Harlene BROCKS, MD   I am having some chest pain  History of Present Illness:    Emily Wood is a 73 y.o. female with a hx of coronary artery disease seen on her coronary CTA, mild to moderate tricuspid regurgitation, pulmonary hypertension, chronic kidney disease, Graves' disease, hyperlipidemia, type 2 diabetes.   I saw the patient on February 19, 2021 at that time she was experiencing chest discomfort as well as shortness of breath.  Given her risk factors I recommended patient undergo a coronary CTA as well as an echocardiogram.   I saw the patient on May 15, 2021 at that time we discussed her testing results.  I increased her atorvastatin  to 40 mg daily.  Continue aspirin  81 mg daily.    I saw the patient on November 04, 2021 at that time we talked about getting a sleep study and repeating her echocardiogram for her tricuspid regurgitation.  I also started the patient on cautious Lasix .  We reviewed all of her lipid profile she was at goal.  She was seen 07/30/2022 at that time she was doing well from CV standpoint.   She reports ongoing management of lymphedema, currently attending a lymphedema clinic and undergoing wrapping treatment until she can receive her garments.  The patient also reports complications following cataract surgery, with swelling of the retina and fluid accumulation behind the retina. She is currently receiving injections to manage these issues. The patient reports that her vision is not as clear as it should be, affecting her ability to read and preventing her from returning to work. She also reports that she is not supposed to be driving due to her vision issues, but she is still doing so during the day.  The patient's hypertension is managed  with amlodipine , carvedilol , Avapro , and clonidine , which she reports taking daily without missing doses. She was previously on hydrochlorothiazide , but this was discontinued due to low kidney function.  No other complaints at this time.   Past Medical History:  Diagnosis Date   Arthritis    Diabetes mellitus without complication (HCC)    Family history of adverse reaction to anesthesia    PONV   Graves disease 04/14/2020   Hypertension    Lymphedema    Mixed hyperlipidemia 02/20/2021    Past Surgical History:  Procedure Laterality Date   CATARACT EXTRACTION W/PHACO Right 04/14/2022   Procedure: CATARACT EXTRACTION PHACO AND INTRAOCULAR LENS PLACEMENT (IOC) RIGHT DIABETIC 10.93 00:53.0;  Surgeon: Jaye Fallow, MD;  Location: [facility];  Service: Ophthalmology;  Laterality: Right;   CATARACT EXTRACTION W/PHACO Left 04/28/2022   Procedure: CATARACT EXTRACTION PHACO AND INTRAOCULAR LENS PLACEMENT (IOC) LEFT DIABETIC 6.05 00:45.7;  Surgeon: Jaye Fallow, MD;  Location: [facility];  Service: Ophthalmology;  Laterality: Left;  Diabetic   CHOLECYSTECTOMY     HERNIA REPAIR     OTHER SURGICAL HISTORY     scar tissue removal from bowels   PARTIAL HYSTERECTOMY     Still has ovaries   REVERSE SHOULDER ARTHROPLASTY Right 09/11/2022   Procedure: REVERSE SHOULDER ARTHROPLASTY;  Surgeon: Sharl Selinda Dover, MD;  Location: [facility];  Service: Orthopedics;  Laterality: Right;  120   UTERINE FIBROID SURGERY      Current Medications: Current  Meds  Medication Sig   Accu-Chek Softclix Lancets lancets CHECK BLOOD SUGAR ONCE DAILY   Alpha-Lipoic Acid 300 MG CAPS Take 600 mg by mouth daily.   amLODipine  (NORVASC ) 10 MG tablet Take 1 tablet (10 mg total) by mouth daily.   Ascorbic Acid (VITAMIN C) 1000 MG tablet Take 1,000 mg by mouth daily.   aspirin  81 MG tablet Take 81 mg by mouth daily.   atorvastatin  (LIPITOR) 40 MG tablet TAKE 1 TABLET BY MOUTH DAILY   Biotin w/  Vitamins C & E (HAIR/SKIN/NAILS PO) Take 6,000 mcg by mouth daily.   Blood Pressure Monitor MISC Use to check blood pressure daily.  Please include a large cuff   brimonidine -timolol  (COMBIGAN ) 0.2-0.5 % ophthalmic solution Apply 1 drop to eye every 12 (twelve) hours.   carvedilol  (COREG ) 12.5 MG tablet TAKE 1 TABLET BY MOUTH TWICE A DAY   Cholecalciferol (VITAMIN D3) 125 MCG (5000 UT) CAPS Take 1 capsule (5,000 Units total) by mouth daily.   cloNIDine  (CATAPRES ) 0.2 MG tablet Take 1 tablet (0.2 mg total) by mouth 3 (three) times daily.   Cobalamin Combinations (B-12) [phone] MCG SUBL Place 5,000 mcg under the tongue daily.   diclofenac  Sodium (VOLTAREN ) 1 % GEL Apply 4 g topically 4 (four) times daily.   ferrous sulfate  324 MG TBEC Take 324 mg by mouth daily.   gabapentin  (NEURONTIN ) 400 MG capsule TAKE 2 CAPSULES BY MOUTH TWICE  DAILY   glucose blood (ACCU-CHEK GUIDE) test strip CHECK BLOOD SUGAR ONCE DAILY   irbesartan  (AVAPRO ) 300 MG tablet TAKE 1 TABLET BY MOUTH DAILY   isosorbide  mononitrate (IMDUR ) 60 MG 24 hr tablet Take 1 tablet (60 mg total) by mouth daily.   methimazole  (TAPAZOLE ) 5 MG tablet Take 2.5 mg by mouth daily.   Multiple Vitamin (MULTIVITAMIN ADULT PO) Take 2 tablets by mouth daily.   nystatin  cream (MYCOSTATIN ) Apply 1 application topically 2 (two) times daily.   oxybutynin  (DITROPAN -XL) 10 MG 24 hr tablet Take 1 tablet (10 mg total) by mouth at bedtime.   potassium chloride  SA (KLOR-CON  M) 20 MEQ tablet TAKE 1 TABLET BY MOUTH DAILY   prednisoLONE  acetate (PRED FORTE ) 1 % ophthalmic suspension Place 1 drop into both eyes 4 (four) times daily.   Semaglutide , 1 MG/DOSE, 4 MG/3ML SOPN Inject 1 mg into the skin once a week.   [DISCONTINUED] isosorbide  mononitrate (IMDUR ) 30 MG 24 hr tablet TAKE 1 TABLET BY MOUTH DAILY   [DISCONTINUED] torsemide  (DEMADEX ) 10 MG tablet TAKE 1 TABLET (10 MG TOTAL) BY MOUTH DAILY. USE AS NEEDED FOR LOWER EXTREMITY SWELLING     Allergies:    Patient has no known allergies.   Social History   Socioeconomic History   Marital status: Married    Spouse name: Franky   Number of children: 0   Years of education: 16   Highest education level: Bachelor's degree (e.g., BA, AB, BS)  Occupational History   Occupation: customer service  Tobacco Use   Smoking status: Never   Smokeless tobacco: Never  Vaping Use   Vaping status: Never Used  Substance and Sexual Activity   Alcohol  use: No    Alcohol /week: 0.0 standard drinks of alcohol     Comment: rarely   Drug use: No   Sexual activity: Not Currently    Birth control/protection: Post-menopausal  Other Topics Concern   Not on file  Social History Narrative   Lives with husband   Caffeine use: 16oz coffee per day   Social  Drivers of Health   Financial Resource Strain: Medium Risk (01/11/2024)   Overall Financial Resource Strain (CARDIA)    Difficulty of Paying Living Expenses: Somewhat hard  Food Insecurity: No Food Insecurity (01/11/2024)   Hunger Vital Sign    Worried About Running Out of Food in the Last Year: Never true    Ran Out of Food in the Last Year: Never true  Transportation Needs: Unmet Transportation Needs (01/11/2024)   PRAPARE - Administrator, Civil Service (Medical): Yes    Lack of Transportation (Non-Medical): Not on file  Physical Activity: Inactive (06/29/2023)   Exercise Vital Sign    Days of Exercise per Week: 0 days    Minutes of Exercise per Session: 0 min  Stress: No Stress Concern Present (01/11/2024)   [employer] of Occupational Health - Occupational Stress Questionnaire    Feeling of Stress: Only a little  Social Connections: Moderately Integrated (01/11/2024)   Social Connection and Isolation Panel    Frequency of Communication with Friends and Family: More than three times a week    Frequency of Social Gatherings with Friends and Family: Patient declined    Attends Religious Services: 1 to 4 times per year    Active Member  of [employer] or Organizations: No    Attends Engineer, structural: Not on file    Marital Status: Married     Family History: The patient's family history includes Breast cancer in her cousin; Cancer in her sister; Diabetes in her maternal aunt and maternal uncle; Hyperlipidemia in her maternal aunt and mother.  ROS:   Review of Systems  Constitution: Negative for decreased appetite, fever and weight gain.  HENT: Negative for congestion, ear discharge, hoarse voice and sore throat.   Eyes: Negative for discharge, redness, vision loss in right eye and visual halos.  Cardiovascular: Negative for chest pain, dyspnea on exertion, leg swelling, orthopnea and palpitations.  Respiratory: Negative for cough, hemoptysis, shortness of breath and snoring.   Endocrine: Negative for heat intolerance and polyphagia.  Hematologic/Lymphatic: Negative for bleeding problem. Does not bruise/bleed easily.  Skin: Negative for flushing, nail changes, rash and suspicious lesions.  Musculoskeletal: Negative for arthritis, joint pain, muscle cramps, myalgias, neck pain and stiffness.  Gastrointestinal: Negative for abdominal pain, bowel incontinence, diarrhea and excessive appetite.  Genitourinary: Negative for decreased libido, genital sores and incomplete emptying.  Neurological: Negative for brief paralysis, focal weakness, headaches and loss of balance.  Psychiatric/Behavioral: Negative for altered mental status, depression and suicidal ideas.  Allergic/Immunologic: Negative for HIV exposure and persistent infections.    EKGs/Labs/Other Studies Reviewed:    The following studies were reviewed today:   EKG:  The ekg ordered today demonstrates sinus bradycardia with occasional pvc  TTE 11/28/2021 IMPRESSIONS     1. Left ventricular ejection fraction, by estimation, is 65 to 70%. The  left ventricle has normal function. The left ventricle has no regional  wall motion abnormalities. Left  ventricular diastolic parameters are  consistent with Grade II diastolic  dysfunction (pseudonormalization). Elevated left atrial pressure.   2. Right ventricular systolic function is normal. The right ventricular  size is mildly enlarged. There is moderately elevated pulmonary artery  systolic pressure. The estimated right ventricular systolic pressure is  55.9 mmHg.   3. The mitral valve is normal in structure. Trivial mitral valve  regurgitation. No evidence of mitral stenosis.   4. Tricuspid valve regurgitation is mild to moderate.   5. The aortic valve was not well  visualized. Aortic valve regurgitation  is not visualized. Aortic valve sclerosis is present, with no evidence of  aortic valve stenosis.   6. The inferior vena cava is normal in size with <50% respiratory  variability, suggesting right atrial pressure of 8 mmHg.   FINDINGS   Left Ventricle: Left ventricular ejection fraction, by estimation, is 65  to 70%. The left ventricle has normal function. The left ventricle has no  regional wall motion abnormalities. The left ventricular internal cavity  size was normal in size. There is   no left ventricular hypertrophy. Left ventricular diastolic parameters  are consistent with Grade II diastolic dysfunction (pseudonormalization).  Elevated left atrial pressure.   Right Ventricle: The right ventricular size is mildly enlarged. No  increase in right ventricular wall thickness. Right ventricular systolic  function is normal. There is moderately elevated pulmonary artery systolic  pressure. The tricuspid regurgitant  velocity is 3.46 m/s, and with an assumed right atrial pressure of 8 mmHg,  the estimated right ventricular systolic pressure is 55.9 mmHg.   Left Atrium: Left atrial size was normal in size.   Right Atrium: Right atrial size was normal in size.   Pericardium: Trivial pericardial effusion is present.   Mitral Valve: The mitral valve is normal in structure.  Trivial mitral  valve regurgitation. No evidence of mitral valve stenosis.   Tricuspid Valve: The tricuspid valve is normal in structure. Tricuspid  valve regurgitation is mild to moderate.   Aortic Valve: The aortic valve was not well visualized. Aortic valve  regurgitation is not visualized. Aortic valve sclerosis is present, with  no evidence of aortic valve stenosis.   Pulmonic Valve: The pulmonic valve was not well visualized. Pulmonic valve  regurgitation is not visualized.   Aorta: The aortic root and ascending aorta are structurally normal, with  no evidence of dilitation.   Venous: The inferior vena cava is normal in size with less than 50%  respiratory variability, suggesting right atrial pressure of 8 mmHg.   IAS/Shunts: The interatrial septum was not well visualized.    Medication Adjustments/Labs and Tests Ordered: Current medicines are reviewed at length with the patient today.  Concerns regarding medicines are outlined above.  Tests Ordered: Orders Placed This Encounter  Procedures   EKG 12-Lead   Medication Changes: Meds ordered this encounter  Medications   isosorbide  mononitrate (IMDUR ) 60 MG 24 hr tablet    Sig: Take 1 tablet (60 mg total) by mouth daily.    Dispense:  90 tablet    Refill:  3   torsemide  (DEMADEX ) 10 MG tablet    Sig: Take 1 tablet (10 mg total) by mouth daily as needed for lower extremity swelling    Dispense:  90 tablet    Refill:  3    CCTA March 03, 2021 FINDINGS: Image quality: Poor quality study due to early trigger of the contrast bolus. The contrast bolus is largely timed for the right ventricular with significant SVC enhancement.   Noise artifact is: Moderate signal to noise artifact due to poor contrast bolus timing.   Coronary Arteries:  Normal coronary origin.  Right dominance.   Left main: The left main is a large caliber vessel with a normal take off from the left coronary cusp that bifurcates to form a  left anterior descending artery and a left circumflex artery.   Left anterior descending artery: The proximal LAD contains minimal calcified plaque (<25%). The mid and distal segments appear patent. The first diagonal branch contains  minimal calcified plaque (<25%).   Left circumflex artery: The LCX is non-dominant and patent with no evidence of plaque or stenosis. The LCX gives off 3 patent obtuse marginal branches.   Right coronary artery: The RCA is dominant with normal take off from the right coronary cusp. The ostial RCA contains mild mixed density plaque (25-49%). The proximal segment contains minimal calcified plaque (<25%). The mid segment is patent. The distal RCA contains minimal calcified plaque (<25%). The RCA terminates as a PDA and right posterolateral branch without evidence of plaque or stenosis.   Right Atrium: Right atrial size is within normal limits.   Right Ventricle: The right ventricular cavity is within normal limits.   Left Atrium: Left atrial size is normal in size with no left atrial appendage filling defect.   Left Ventricle: The ventricular cavity size is within normal limits. There are no stigmata of prior infarction. There is no abnormal filling defect.   Pulmonary arteries: Normal in size without proximal filling defect.   Pulmonary veins: Normal pulmonary venous drainage.   Pericardium: Normal thickness with no significant effusion or calcium  present.   Cardiac valves: The aortic valve is trileaflet without significant calcification. The mitral valve is normal structure with minimal annular calcium .   Aorta: Normal caliber with no significant disease.   Extra-cardiac findings: See attached radiology report for non-cardiac structures.   IMPRESSION: 1. Poor quality study due to early trigger of the contrast bolus. The contrast bolus is largely timed for the right ventricular with significant SVC enhancement. Study remains  interpretable.   2. Coronary calcium  score of 418. This was 93rd percentile for age-, sex, and race-matched controls.   3. Normal coronary origin with right dominance.   4. Mild mixed density plaque (25-49%) in the ostial RCA.   5. Minimal calcified plaque (<25%) in the proximal LAD.   RECOMMENDATIONS: 1. Mild non-obstructive CAD (25-49%) in the ostial RCA. CT FFR will be sent due to ostial location. Consider preventive therapy and risk factor modification.   Darryle Decent, MD   Echo August 2022 FINDINGS   Left Ventricle: Left ventricular ejection fraction, by estimation, is 60  to 65%. The left ventricle has normal function. The left ventricle has no  regional wall motion abnormalities. Definity  contrast agent was given IV  to delineate the left ventricular   endocardial borders. The left ventricular internal cavity size was normal  in size. There is no left ventricular hypertrophy. Left ventricular  diastolic parameters are consistent with Grade II diastolic dysfunction  (pseudonormalization). Elevated left  atrial pressure.   Right Ventricle: The right ventricular size is normal. No increase in  right ventricular wall thickness. Right ventricular systolic function is  normal. There is moderately elevated pulmonary artery systolic pressure.  The tricuspid regurgitant velocity is  3.35 m/s, and with an assumed right atrial pressure of 8 mmHg, the  estimated right ventricular systolic pressure is 52.9 mmHg.   Left Atrium: Left atrial size was mildly dilated.   Right Atrium: Right atrial size was normal in size.   Pericardium: There is no evidence of pericardial effusion.   Mitral Valve: The mitral valve is normal in structure. Trivial mitral  valve regurgitation. No evidence of mitral valve stenosis.   Tricuspid Valve: The tricuspid valve is normal in structure. Tricuspid  valve regurgitation is mild to moderate. No evidence of tricuspid  stenosis.   Aortic Valve:  The aortic valve is normal in structure. Aortic valve  regurgitation is not visualized. No aortic  stenosis is present.   Pulmonic Valve: The pulmonic valve was normal in structure. Pulmonic valve  regurgitation is not visualized. No evidence of pulmonic stenosis.   Aorta: The aortic root is normal in size and structure.   Venous: The inferior vena cava is normal in size with greater than 50%  respiratory variability, suggesting right atrial pressure of 3 mmHg.   IAS/Shunts: No atrial level shunt detected by color flow Doppler.       Recent Labs: 01/12/2024: ALT 14; BUN 19; Creatinine, Ser 1.62; Hemoglobin 10.6; Platelets 273.0; Potassium 4.0; Sodium 138; TSH 4.49  Recent Lipid Panel    Component Value Date/Time   CHOL 144 01/12/2024 0939   CHOL 129 07/02/2021 0944   TRIG 68.0 01/12/2024 0939   HDL 61.00 01/12/2024 0939   HDL 56 07/02/2021 0944   CHOLHDL 2 01/12/2024 0939   VLDL 13.6 01/12/2024 0939   LDLCALC 70 01/12/2024 0939   LDLCALC 59 07/02/2021 0944    Physical Exam:    VS:  BP (!) 174/80   Pulse (!) 58   Ht 5' 6 (1.676 m)   Wt 247 lb 12.8 oz (112.4 kg)   SpO2 98%   BMI 40.00 kg/m     Wt Readings from Last 3 Encounters:  02/01/24 247 lb 12.8 oz (112.4 kg)  01/12/24 249 lb 12.8 oz (113.3 kg)  08/23/23 227 lb (103 kg)     GEN: Well nourished, well developed in no acute distress HEENT: Normal NECK: No JVD; No carotid bruits LYMPHATICS: No lymphadenopathy CARDIAC: S1S2 noted,RRR, no murmurs, rubs, gallops RESPIRATORY:  Clear to auscultation without rales, wheezing or rhonchi  ABDOMEN: Soft, non-tender, non-distended, +bowel sounds, no guarding. EXTREMITIES: No edema, No cyanosis, no clubbing MUSCULOSKELETAL:  No deformity  SKIN: Warm and dry NEUROLOGIC:  Alert and oriented x 3, non-focal PSYCHIATRIC:  Normal affect, good insight  ASSESSMENT:    1. Essential hypertension   2. Coronary artery disease involving native coronary artery of native heart,  unspecified whether angina present   3. Insulin -requiring or dependent type II diabetes mellitus (HCC)   4. Hypertensive heart disease without heart failure   5. Mixed hyperlipidemia     PLAN:    Hypertension - her blood pressure is elevated in the office today. She reports that she has not taken her medications this morning. I have advised the patient to her medication prior to her visits - this will help me to care for her better in terms of adjusting her medications.  I am increasing her Imdur  sure to reported chest pain this will also help with her blood pressure.  -Follow up with the pharmacist in 4 weeks to monitor blood pressure.  Lymphedema Undergoing treatment at a lymphedema clinic, with wrapping of one limb until garments can be obtained. -Continue current treatment plan at lymphedema clinic.  CAD - previous CCTA with mild CAD in 2022. I will increase her Imdur  and if she does not improve by her next visit I will proceed with an ischemic evaluation given her high risk for progression.    Diabetes mellitus-she follows with endocrine at [facility]   The patient understands the need to lose weight with diet and exercise. We have discussed specific strategies for this.  The patient is in agreement with the above plan. The patient left the office in stable condition.  The patient will follow up in   Medication Adjustments/Labs and Tests Ordered: Current medicines are reviewed at length with the patient today.  Concerns  regarding medicines are outlined above.  Orders Placed This Encounter  Procedures   EKG 12-Lead   Meds ordered this encounter  Medications   isosorbide  mononitrate (IMDUR ) 60 MG 24 hr tablet    Sig: Take 1 tablet (60 mg total) by mouth daily.    Dispense:  90 tablet    Refill:  3   torsemide  (DEMADEX ) 10 MG tablet    Sig: Take 1 tablet (10 mg total) by mouth daily as needed for lower extremity swelling    Dispense:  90 tablet    Refill:  3     Patient Instructions  Medication Instructions:  Your physician has recommended you make the following change in your medication:  INCREASE: Imdur  60 mg once daily  *If you need a refill on your cardiac medications before your next appointment, please call your pharmacy*  Follow-Up: At [facility], you and your health needs are our priority.  As part of our continuing mission to provide you with exceptional heart care, our providers are all part of one team.  This team includes your primary Cardiologist (physician) and Advanced Practice Providers or APPs (Physician Assistants and Nurse Practitioners) who all work together to provide you with the care you need, when you need it.  Your next appointment:   4 month(s)  Provider:   Racquel Arkin, DO    Other Instructions Please see our pharmacy staff in 4 weeks.  Please bring your blood pressure readings with you to that appointment. Please take your blood pressure medication 2 hours before pharmacy appointment and appointment with Dr. Sheena.        Adopting a Healthy Lifestyle.  Know what a healthy weight is for you (roughly BMI <25) and aim to maintain this   Aim for 7+ servings of fruits and vegetables daily   65-80+ fluid ounces of water or unsweet tea for healthy kidneys   Limit to max 1 drink of alcohol  per day; avoid smoking/tobacco   Limit animal fats in diet for cholesterol and heart health - choose grass fed whenever available   Avoid highly processed foods, and foods high in saturated/trans fats   Aim for low stress - take time to unwind and care for your mental health   Aim for 150 min of moderate intensity exercise weekly for heart health, and weights twice weekly for bone health   Aim for 7-9 hours of sleep daily   When it comes to diets, agreement about the perfect plan isnt easy to find, even among the experts. Experts at the [facility] [employer] developed an idea known as the Healthy  Eating Plate. Just imagine a plate divided into logical, healthy portions.   The emphasis is on diet quality:   Load up on vegetables and fruits - one-half of your plate: Aim for color and variety, and remember that potatoes dont count.   Go for whole grains - one-quarter of your plate: Whole wheat, barley, wheat berries, quinoa, oats, brown rice, and foods made with them. If you want pasta, go with whole wheat pasta.   Protein power - one-quarter of your plate: Fish, chicken, beans, and nuts are all healthy, versatile protein sources. Limit red meat.   The diet, however, does go beyond the plate, offering a few other suggestions.   Use healthy plant oils, such as olive, canola, soy, corn, sunflower and peanut. Check the labels, and avoid partially hydrogenated oil, which have unhealthy trans fats.   If youre thirsty, drink  water. Coffee and tea are good in moderation, but skip sugary drinks and limit milk and dairy products to one or two daily servings.   The type of carbohydrate in the diet is more important than the amount. Some sources of carbohydrates, such as vegetables, fruits, whole grains, and beans-are healthier than others.   Finally, stay active  Signed, Dub Huntsman, DO  02/01/2024 6:54 PM    [facility]

## 2024-02-10 ENCOUNTER — Other Ambulatory Visit: Payer: Self-pay | Admitting: Family Medicine

## 2024-02-10 DIAGNOSIS — E119 Type 2 diabetes mellitus without complications: Secondary | ICD-10-CM

## 2024-02-24 NOTE — Progress Notes (Signed)
 [facility] Quality Team Note  Name: Emily Wood Date of Birth: 07-24-1950 MRN: [id number] Date: 02/24/2024  [facility] Quality Team has reviewed this patient's chart, please see recommendations below:  [facility] Quality Other; (CHART REVIEWED FOR CBP AND GSD. LAST BLOOD PRESSURE OUT OF RANGE, ABSTRACTED JUNE A1C)

## 2024-02-28 DIAGNOSIS — H02403 Unspecified ptosis of bilateral eyelids: Secondary | ICD-10-CM | POA: Diagnosis not present

## 2024-02-28 DIAGNOSIS — H35353 Cystoid macular degeneration, bilateral: Secondary | ICD-10-CM | POA: Diagnosis not present

## 2024-02-28 DIAGNOSIS — H20013 Primary iridocyclitis, bilateral: Secondary | ICD-10-CM | POA: Diagnosis not present

## 2024-03-09 DIAGNOSIS — M25562 Pain in left knee: Secondary | ICD-10-CM | POA: Diagnosis not present

## 2024-03-09 DIAGNOSIS — M170 Bilateral primary osteoarthritis of knee: Secondary | ICD-10-CM | POA: Diagnosis not present

## 2024-03-11 ENCOUNTER — Other Ambulatory Visit: Payer: Self-pay | Admitting: Family Medicine

## 2024-03-11 DIAGNOSIS — R600 Localized edema: Secondary | ICD-10-CM

## 2024-03-15 ENCOUNTER — Other Ambulatory Visit: Payer: Self-pay | Admitting: Cardiology

## 2024-03-16 ENCOUNTER — Other Ambulatory Visit: Payer: Self-pay | Admitting: Cardiology

## 2024-03-16 ENCOUNTER — Ambulatory Visit: Attending: Internal Medicine | Admitting: Pharmacist

## 2024-03-16 VITALS — BP 154/80 | HR 65

## 2024-03-16 DIAGNOSIS — I10 Essential (primary) hypertension: Secondary | ICD-10-CM | POA: Diagnosis not present

## 2024-03-16 MED ORDER — AMLODIPINE BESYLATE 10 MG PO TABS: 10.0000 mg | ORAL_TABLET | Freq: Every day | ORAL | 0 refills | Status: DC

## 2024-03-16 NOTE — Progress Notes (Signed)
 Patient ID: AMMARIE MATSUURA                 DOB: 1951-04-09                      MRN: [id number]     HPI: Emily Wood is a 73 y.o. female referred by Dr. Sheena to HTN clinic. PMH is significant for T2DM, Graves disease, HTN, lymphedema, and HLD.  Patient seen by Dr Sheena on 7/15 and was hypertensive in room at 174/80. Isosorbide  was increased.  Patient received steroid injections in both knees last week. Reported stiffness and pain, in left knee specifically.  Patient presents today to discuss HTN treatment. Currently on irbesartan , torsemide , carvedilol , amlodipine , clonidine , and isosorbide . Previously on hydrochlorothiazide  but that was discontinued due to renal function.  Last eGFR 31.66.  Patient reports no adverse effects from currently prescribed medications.  Watches her diet to avoid excess salt and carbs. Has difficulty exercising due to knee pain.   Current HTN meds:  Irbesartan  300mg  daily Torsemide  10mg  daily Carvedilol  12.5mg  twice a day Amlodipine  10mg  daily Clonidine  0.2mg  three times a day Isosorbide  60mg  daily  Previously tried: hydrochlorothiazide    BP goal: <130/80   Wt Readings from Last 3 Encounters:  02/01/24 247 lb 12.8 oz (112.4 kg)  01/12/24 249 lb 12.8 oz (113.3 kg)  08/23/23 227 lb (103 kg)   BP Readings from Last 3 Encounters:  02/01/24 (!) 174/80  01/12/24 128/78  08/23/23 (!) 155/72   Pulse Readings from Last 3 Encounters:  02/01/24 (!) 58  01/12/24 62  08/23/23 (!) 59    Renal function: CrCl cannot be calculated (Patient's most recent lab result is older than the maximum 21 days allowed.).  Past Medical History:  Diagnosis Date   Arthritis    Diabetes mellitus without complication (HCC)    Family history of adverse reaction to anesthesia    PONV   Graves disease 04/14/2020   Hypertension    Lymphedema    Mixed hyperlipidemia 02/20/2021    Current Outpatient Medications on File Prior to Visit  Medication Sig  Dispense Refill   ACCU-CHEK GUIDE TEST test strip CHECK BLOOD SUGAR ONCE DAILY 100 strip 2   Accu-Chek Softclix Lancets lancets CHECK BLOOD SUGAR ONCE DAILY 100 each 2   Alpha-Lipoic Acid 300 MG CAPS Take 600 mg by mouth daily.     amLODipine  (NORVASC ) 10 MG tablet Take 1 tablet (10 mg total) by mouth daily. 90 tablet 0   Ascorbic Acid (VITAMIN C) 1000 MG tablet Take 1,000 mg by mouth daily.     aspirin  81 MG tablet Take 81 mg by mouth daily.     atorvastatin  (LIPITOR) 40 MG tablet TAKE 1 TABLET BY MOUTH DAILY 100 tablet 2   Biotin w/ Vitamins C & E (HAIR/SKIN/NAILS PO) Take 6,000 mcg by mouth daily.     Blood Pressure Monitor MISC Use to check blood pressure daily.  Please include a large cuff     brimonidine -timolol  (COMBIGAN ) 0.2-0.5 % ophthalmic solution Apply 1 drop to eye every 12 (twelve) hours.     carvedilol  (COREG ) 12.5 MG tablet TAKE 1 TABLET BY MOUTH TWICE A DAY 180 tablet 2   Cholecalciferol (VITAMIN D3) 125 MCG (5000 UT) CAPS Take 1 capsule (5,000 Units total) by mouth daily.     cloNIDine  (CATAPRES ) 0.2 MG tablet Take 1 tablet (0.2 mg total) by mouth 3 (three) times daily. 270 tablet 3   Cobalamin Combinations (B-12)  [phone] MCG SUBL Place 5,000 mcg under the tongue daily.     diclofenac  Sodium (VOLTAREN ) 1 % GEL Apply 4 g topically 4 (four) times daily. 100 g 0   ferrous sulfate  324 MG TBEC Take 324 mg by mouth daily.     gabapentin  (NEURONTIN ) 400 MG capsule TAKE 2 CAPSULES BY MOUTH TWICE  DAILY 400 capsule 2   irbesartan  (AVAPRO ) 300 MG tablet TAKE 1 TABLET BY MOUTH DAILY 100 tablet 2   isosorbide  mononitrate (IMDUR ) 60 MG 24 hr tablet Take 1 tablet (60 mg total) by mouth daily. 90 tablet 3   methimazole  (TAPAZOLE ) 5 MG tablet Take 2.5 mg by mouth daily.     Multiple Vitamin (MULTIVITAMIN ADULT PO) Take 2 tablets by mouth daily.     nystatin  cream (MYCOSTATIN ) Apply 1 application topically 2 (two) times daily. 30 g 1   oxybutynin  (DITROPAN -XL) 10 MG 24 hr tablet Take 1  tablet (10 mg total) by mouth at bedtime. 90 tablet 3   potassium chloride  SA (KLOR-CON  M) 20 MEQ tablet TAKE 1 TABLET BY MOUTH DAILY 100 tablet 2   prednisoLONE  acetate (PRED FORTE ) 1 % ophthalmic suspension Place 1 drop into both eyes 4 (four) times daily. 5 mL 0   Semaglutide , 1 MG/DOSE, 4 MG/3ML SOPN Inject 1 mg into the skin once a week. 3 mL 1   torsemide  (DEMADEX ) 10 MG tablet Take 1 tablet (10 mg total) by mouth daily as needed for lower extremity swelling 90 tablet 3   Turmeric 500 MG CAPS Take 500 mg by mouth daily.     No current facility-administered medications on file prior to visit.    No Known Allergies   Assessment/Plan:  1. Hypertension -  Patient BP in room 154/80 today which is above goal of <1130/80 although goal could be relaxed to <140/80 due to impaired renal function. Knee pain and recent steroid injections could also be contributing.  Patient on max dose irbesartan  and amlodipine . Medications to increase could be clonidine  or carvedilol  but will defer for now due to risk of hypotension (patient a fall risk) or bradycardia. Will have patient continue to monitor at home and continue current regimen.  Continue: Irbesartan  300mg  dailty Torsemide  10mg  daily Carvedilol  12.5mg  twice a day Amlodipine  10mg  daily Clonidine  0.2mg  three times a day Isosorbide  60mg  daily  Medford Bolk, PharmD, BCACP, CDCES, CPP [facility] [address] [place], [place] [address] Phone: [phone]; Fax: [phone] 05/14/2024 11:34 AM

## 2024-03-16 NOTE — Patient Instructions (Addendum)
 Good seeing you again  We would like your blood pressure to be less than 130/80. It looks better today!  Continue your: Irbesartan  300mg  dailty Torsemide  10mg  daily Carvedilol  12.5mg  twice a day Amlodipine  10mg  daily Clonidine  0.2mg  three times a day Isosorbide  60mg  daily  Start checking your blood pressure at home about an hour or 2 after your morning medications  I will contact you next week to check your home blood pressure readings  Medford Bolk, PharmD, BCACP, CDCES, CPP [facility] [address] [place], [place] [address] Phone: [phone]; Fax: [phone] 03/16/2024 2:45 PM

## 2024-03-21 DIAGNOSIS — M1711 Unilateral primary osteoarthritis, right knee: Secondary | ICD-10-CM | POA: Diagnosis not present

## 2024-03-21 DIAGNOSIS — S8001XA Contusion of right knee, initial encounter: Secondary | ICD-10-CM | POA: Diagnosis not present

## 2024-03-21 DIAGNOSIS — S46912A Strain of unspecified muscle, fascia and tendon at shoulder and upper arm level, left arm, initial encounter: Secondary | ICD-10-CM | POA: Diagnosis not present

## 2024-03-28 DIAGNOSIS — N1832 Chronic kidney disease, stage 3b: Secondary | ICD-10-CM | POA: Diagnosis not present

## 2024-04-03 DIAGNOSIS — E1122 Type 2 diabetes mellitus with diabetic chronic kidney disease: Secondary | ICD-10-CM | POA: Diagnosis not present

## 2024-04-03 DIAGNOSIS — I129 Hypertensive chronic kidney disease with stage 1 through stage 4 chronic kidney disease, or unspecified chronic kidney disease: Secondary | ICD-10-CM | POA: Diagnosis not present

## 2024-04-03 DIAGNOSIS — E782 Mixed hyperlipidemia: Secondary | ICD-10-CM | POA: Diagnosis not present

## 2024-04-03 DIAGNOSIS — N1832 Chronic kidney disease, stage 3b: Secondary | ICD-10-CM | POA: Diagnosis not present

## 2024-04-03 DIAGNOSIS — N2581 Secondary hyperparathyroidism of renal origin: Secondary | ICD-10-CM | POA: Diagnosis not present

## 2024-04-03 DIAGNOSIS — I890 Lymphedema, not elsewhere classified: Secondary | ICD-10-CM | POA: Diagnosis not present

## 2024-04-03 DIAGNOSIS — D631 Anemia in chronic kidney disease: Secondary | ICD-10-CM | POA: Diagnosis not present

## 2024-04-05 DIAGNOSIS — M25512 Pain in left shoulder: Secondary | ICD-10-CM | POA: Diagnosis not present

## 2024-04-10 ENCOUNTER — Ambulatory Visit: Payer: Self-pay

## 2024-04-10 ENCOUNTER — Encounter: Payer: Self-pay | Admitting: Emergency Medicine

## 2024-04-10 ENCOUNTER — Telehealth (INDEPENDENT_AMBULATORY_CARE_PROVIDER_SITE_OTHER): Admitting: Emergency Medicine

## 2024-04-10 ENCOUNTER — Other Ambulatory Visit: Payer: Self-pay | Admitting: Family Medicine

## 2024-04-10 VITALS — Ht 66.0 in | Wt 249.0 lb

## 2024-04-10 DIAGNOSIS — I10 Essential (primary) hypertension: Secondary | ICD-10-CM

## 2024-04-10 DIAGNOSIS — R399 Unspecified symptoms and signs involving the genitourinary system: Secondary | ICD-10-CM

## 2024-04-10 NOTE — Telephone Encounter (Signed)
 FYI Only or Action Required?: FYI only for provider.  Patient was last seen in primary care on 01/12/2024 by Copland, Harlene BROCKS, MD.  Called Nurse Triage reporting Dysuria.  Symptoms began several days ago.  Interventions attempted: Other: cranberry juice; she also bought [employer] brand AZO but has not started it yet.  Symptoms are: urinary frequency, dysuria (pressure and stinging when urinating) gradually worsening.  Triage Disposition: See HCP Within 4 Hours (Or PCP Triage)  Patient/caregiver understands and will follow disposition?: Yes             Message from [place] E sent at 04/10/2024  3:40 PM EDT  Summary: UTI symptoms   Reason for Triage: UTI symptoms. After urinating patient feels increased pressure, along with a burning sensation and a tingling feeling.Patient stated she did not have pain anywhere. Symptoms going on for 3 days.         Reason for Disposition  Diabetes mellitus or weak immune system (e.g., HIV positive, cancer chemo, splenectomy, organ transplant, chronic steroids)  Answer Assessment - Initial Assessment Questions 1. SEVERITY: How bad is the pain?  (e.g., Scale 1-10; mild, moderate, or severe)     Pressure, stinging. 6-7/10. She states there is an annoying pain after she urinates near bladder.  2. FREQUENCY: How many times have you had painful urination today?      Yes increased frequency; about 7 times.  3. PATTERN: Is pain present every time you urinate or just sometimes?      Every time.  4. ONSET: When did the painful urination start?      Last week, she states suddenly on Friday.  5. FEVER: Do you have a fever? If Yes, ask: What is your temperature, how was it measured, and when did it start?     No.  6. PAST UTI: Have you had a urine infection before? If Yes, ask: When was the last time? and What happened that time?      Yes, ages ago and was treated with an antibiotic.  7. CAUSE: What do you think is  causing the painful urination?  (e.g., UTI, scratch, Herpes sore)     UTI.  8. OTHER SYMPTOMS: Do you have any other symptoms? (e.g., blood in urine, flank pain, genital sores, urgency, vaginal discharge)     Patient denies nausea, vomiting, fever, blood in urine, back pain, flank pain.  9. PREGNANCY: Is there any chance you are pregnant? When was your last menstrual period?     N/A.  Protocols used: Urination Pain - Female-A-AH

## 2024-04-11 ENCOUNTER — Other Ambulatory Visit

## 2024-04-11 DIAGNOSIS — R399 Unspecified symptoms and signs involving the genitourinary system: Secondary | ICD-10-CM

## 2024-04-11 NOTE — Progress Notes (Signed)
 Virtual Visit via Video Note  I connected with Emily Wood on 04/11/24 at  4:20 PM EDT by a video enabled telemedicine application and verified that I am speaking with the correct person using two identifiers.  Patient Location: Home Provider Location: Office/Clinic  I discussed the limitations, risks, security, and privacy concerns of performing an evaluation and management service by video and the availability of in person appointments. The patient expressed understanding and agreed to proceed.  Subjective: PCP: Copland, Harlene BROCKS, MD  Chief Complaint  Patient presents with   Dysuria    Dysuria and cloudy urine x 3 days   Emily Wood is a 73 y.o. female who complains of suspected UTI Sx are mild, but present for several days without improvement Dysuria, increased urinary frequency, and a mild pressure in the suprapubic region.  DENIES feeling unwell, any fevers or chills, emesis, flank pain, hematuria.  Does NOT have a history of recurrent UTI.  DOES have a history of CKD stage 3, GFR 39 based on chart review of outside labs drawn in the last 2 weeks.     ROS: Pertinent components reviewed in HPI and are otherwise negative unless specifically indicated above.   Relevant past medical history reviewed and updated as indicated.   Allergies and medications reviewed and updated.   Current Outpatient Medications:    Alpha-Lipoic Acid 300 MG CAPS, Take 600 mg by mouth daily., Disp: , Rfl:    amLODipine  (NORVASC ) 10 MG tablet, Take 1 tablet (10 mg total) by mouth daily., Disp: 90 tablet, Rfl: 0   Ascorbic Acid (VITAMIN C) 1000 MG tablet, Take 1,000 mg by mouth daily., Disp: , Rfl:    aspirin  81 MG tablet, Take 81 mg by mouth daily., Disp: , Rfl:    atorvastatin  (LIPITOR) 40 MG tablet, TAKE 1 TABLET BY MOUTH DAILY, Disp: 100 tablet, Rfl: 2   Biotin w/ Vitamins C & E (HAIR/SKIN/NAILS PO), Take 6,000 mcg by mouth daily., Disp: , Rfl:    brimonidine -timolol  (COMBIGAN )  0.2-0.5 % ophthalmic solution, Apply 1 drop to eye every 12 (twelve) hours., Disp: , Rfl:    carvedilol  (COREG ) 12.5 MG tablet, TAKE 1 TABLET BY MOUTH TWICE A DAY, Disp: 180 tablet, Rfl: 2   Cholecalciferol (VITAMIN D3) 125 MCG (5000 UT) CAPS, Take 1 capsule (5,000 Units total) by mouth daily., Disp: , Rfl:    cloNIDine  (CATAPRES ) 0.2 MG tablet, Take 1 tablet (0.2 mg total) by mouth 3 (three) times daily., Disp: 270 tablet, Rfl: 3   Cobalamin Combinations (B-12) [phone] MCG SUBL, Place 5,000 mcg under the tongue daily., Disp: , Rfl:    diclofenac  Sodium (VOLTAREN ) 1 % GEL, Apply 4 g topically 4 (four) times daily., Disp: 100 g, Rfl: 0   ferrous sulfate  324 MG TBEC, Take 324 mg by mouth daily., Disp: , Rfl:    gabapentin  (NEURONTIN ) 400 MG capsule, TAKE 2 CAPSULES BY MOUTH TWICE  DAILY, Disp: 400 capsule, Rfl: 2   irbesartan  (AVAPRO ) 300 MG tablet, TAKE 1 TABLET BY MOUTH DAILY, Disp: 100 tablet, Rfl: 2   isosorbide  mononitrate (IMDUR ) 60 MG 24 hr tablet, Take 1 tablet (60 mg total) by mouth daily., Disp: 90 tablet, Rfl: 3   methimazole  (TAPAZOLE ) 5 MG tablet, Take 2.5 mg by mouth daily., Disp: , Rfl:    Multiple Vitamin (MULTIVITAMIN ADULT PO), Take 2 tablets by mouth daily., Disp: , Rfl:    oxybutynin  (DITROPAN -XL) 10 MG 24 hr tablet, Take 1 tablet (10 mg total)  by mouth at bedtime., Disp: 90 tablet, Rfl: 3   potassium chloride  SA (KLOR-CON  M) 20 MEQ tablet, TAKE 1 TABLET BY MOUTH DAILY, Disp: 100 tablet, Rfl: 2   prednisoLONE  acetate (PRED FORTE ) 1 % ophthalmic suspension, Place 1 drop into both eyes 4 (four) times daily., Disp: 5 mL, Rfl: 0   Semaglutide , 1 MG/DOSE, 4 MG/3ML SOPN, Inject 1 mg into the skin once a week., Disp: 3 mL, Rfl: 1   torsemide  (DEMADEX ) 10 MG tablet, Take 1 tablet (10 mg total) by mouth daily as needed for lower extremity swelling, Disp: 90 tablet, Rfl: 3   Turmeric 500 MG CAPS, Take 500 mg by mouth daily., Disp: , Rfl:    ACCU-CHEK GUIDE TEST test strip, CHECK BLOOD  SUGAR ONCE DAILY, Disp: 100 strip, Rfl: 2   Accu-Chek Softclix Lancets lancets, CHECK BLOOD SUGAR ONCE DAILY, Disp: 100 each, Rfl: 2   Blood Pressure Monitor MISC, Use to check blood pressure daily.  Please include a large cuff, Disp: , Rfl:    nystatin  cream (MYCOSTATIN ), Apply 1 application topically 2 (two) times daily. (Patient not taking: Reported on 04/10/2024), Disp: 30 g, Rfl: 1  Observations/Objective: Today's Vitals   04/10/24 1613  Weight: 249 lb (112.9 kg)  Height: 5' 6 (1.676 m)   Appears well Alert and oriented Ambulatory about her home Sclera anicteric MMM Speech clear and appropriate  Assessment & Plan UTI symptoms She will drop off a urine in the am, and we will direct treatment based on the result We discussed UTI urgent recheck precautions and she voiced understanding and agreement Orders:   Urinalysis, Routine w reflex microscopic; Future   Urine Culture; Future   I discussed the assessment and treatment plan with the patient. The patient was provided an opportunity to ask questions, and all were answered. The patient agreed with the plan and demonstrated an understanding of the instructions.   The patient was advised to call back or seek an in-person evaluation if the symptoms worsen or if the condition fails to improve as anticipated.  The above assessment and management plan was discussed with the patient. The patient verbalized understanding of and has agreed to the management plan.   Sabastien Tyler, PA-C

## 2024-04-12 ENCOUNTER — Ambulatory Visit: Payer: Self-pay | Admitting: Emergency Medicine

## 2024-04-12 MED ORDER — CEPHALEXIN 500 MG PO CAPS: 500.0000 mg | ORAL_CAPSULE | Freq: Two times a day (BID) | ORAL | 0 refills | Status: AC

## 2024-04-13 DIAGNOSIS — M25512 Pain in left shoulder: Secondary | ICD-10-CM | POA: Diagnosis not present

## 2024-04-14 LAB — URINALYSIS, ROUTINE W REFLEX MICROSCOPIC
Bilirubin, UA: NEGATIVE
Glucose, UA: NEGATIVE
Ketones, UA: NEGATIVE
Nitrite, UA: NEGATIVE
Specific Gravity, UA: 1.011 (ref 1.005–1.030)
Urobilinogen, Ur: 0.2 mg/dL (ref 0.2–1.0)
pH, UA: 6.5 (ref 5.0–7.5)

## 2024-04-14 LAB — MICROSCOPIC EXAMINATION
Bacteria, UA: NONE SEEN
Casts: NONE SEEN /LPF
WBC, UA: >30 /HPF — AB (ref 0–5)

## 2024-04-14 LAB — URINE CULTURE

## 2024-05-03 DIAGNOSIS — M25512 Pain in left shoulder: Secondary | ICD-10-CM | POA: Diagnosis not present

## 2024-05-08 ENCOUNTER — Other Ambulatory Visit (HOSPITAL_COMMUNITY): Payer: Self-pay

## 2024-05-09 DIAGNOSIS — M6281 Muscle weakness (generalized): Secondary | ICD-10-CM | POA: Diagnosis not present

## 2024-05-09 DIAGNOSIS — S46092D Other injury of muscle(s) and tendon(s) of the rotator cuff of left shoulder, subsequent encounter: Secondary | ICD-10-CM | POA: Diagnosis not present

## 2024-05-09 DIAGNOSIS — M25512 Pain in left shoulder: Secondary | ICD-10-CM | POA: Diagnosis not present

## 2024-05-14 ENCOUNTER — Other Ambulatory Visit: Payer: Self-pay | Admitting: Family Medicine

## 2024-05-14 ENCOUNTER — Encounter: Payer: Self-pay | Admitting: Pharmacist

## 2024-05-14 DIAGNOSIS — M5432 Sciatica, left side: Secondary | ICD-10-CM

## 2024-05-17 DIAGNOSIS — S46092D Other injury of muscle(s) and tendon(s) of the rotator cuff of left shoulder, subsequent encounter: Secondary | ICD-10-CM | POA: Diagnosis not present

## 2024-05-17 DIAGNOSIS — M6281 Muscle weakness (generalized): Secondary | ICD-10-CM | POA: Diagnosis not present

## 2024-05-17 DIAGNOSIS — M25512 Pain in left shoulder: Secondary | ICD-10-CM | POA: Diagnosis not present

## 2024-05-30 ENCOUNTER — Encounter: Payer: Self-pay | Admitting: *Deleted

## 2024-06-01 ENCOUNTER — Ambulatory Visit: Attending: Cardiology | Admitting: Cardiology

## 2024-06-01 ENCOUNTER — Encounter: Payer: Self-pay | Admitting: Cardiology

## 2024-06-01 VITALS — BP 148/86 | HR 64 | Ht 65.0 in | Wt 243.6 lb

## 2024-06-01 DIAGNOSIS — E119 Type 2 diabetes mellitus without complications: Secondary | ICD-10-CM | POA: Diagnosis not present

## 2024-06-01 DIAGNOSIS — Z794 Long term (current) use of insulin: Secondary | ICD-10-CM

## 2024-06-01 DIAGNOSIS — I119 Hypertensive heart disease without heart failure: Secondary | ICD-10-CM | POA: Diagnosis not present

## 2024-06-01 DIAGNOSIS — I890 Lymphedema, not elsewhere classified: Secondary | ICD-10-CM

## 2024-06-01 DIAGNOSIS — I2510 Atherosclerotic heart disease of native coronary artery without angina pectoris: Secondary | ICD-10-CM

## 2024-06-01 NOTE — Progress Notes (Signed)
 Cardiology Office Note:    Date:  06/01/2024   ID:  Emily Wood, DOB 1951-06-27, MRN [id number]  PCP:  Watt Harlene BROCKS, MD  Cardiologist:  Emily Huntsman, Emily Wood  Electrophysiologist:  None   Referring MD: Watt Harlene BROCKS, MD   I am having some chest pain  History of Present Illness:    Emily Wood is a 73 y.o. female with a hx of coronary artery disease seen on her coronary CTA, mild to moderate tricuspid regurgitation, pulmonary hypertension, chronic kidney disease, Graves' disease, hyperlipidemia, type 2 diabetes.   At last visit with me in July she was hypertensive adjust her medication and asked that she see our pharmacy hypertension clinic.  She was seen in August 2025 at that time medication adjustment was deferred due to risk of falling which I Emily Wood agree with.  She is here today she tells me at home her blood pressure is between the  high 130s and 140s.  No other complaints.  Past Medical History:  Diagnosis Date   Arthritis    Diabetes mellitus without complication (HCC)    Family history of adverse reaction to anesthesia    PONV   Graves disease 04/14/2020   Hypertension    Lymphedema    Mixed hyperlipidemia 02/20/2021    Past Surgical History:  Procedure Laterality Date   ABDOMINAL HYSTERECTOMY     Partial   CATARACT EXTRACTION W/PHACO Right 04/14/2022   Procedure: CATARACT EXTRACTION PHACO AND INTRAOCULAR LENS PLACEMENT (IOC) RIGHT DIABETIC 10.93 00:53.0;  Surgeon: Jaye Fallow, MD;  Location: [facility];  Service: Ophthalmology;  Laterality: Right;   CATARACT EXTRACTION W/PHACO Left 04/28/2022   Procedure: CATARACT EXTRACTION PHACO AND INTRAOCULAR LENS PLACEMENT (IOC) LEFT DIABETIC 6.05 00:45.7;  Surgeon: Jaye Fallow, MD;  Location: [facility];  Service: Ophthalmology;  Laterality: Left;  Diabetic   CHOLECYSTECTOMY     EYE SURGERY  02/24   HERNIA REPAIR     OTHER SURGICAL HISTORY     scar tissue removal from  bowels   PARTIAL HYSTERECTOMY     Still has ovaries   REVERSE SHOULDER ARTHROPLASTY Right 09/11/2022   Procedure: REVERSE SHOULDER ARTHROPLASTY;  Surgeon: Sharl Selinda Dover, MD;  Location: [facility];  Service: Orthopedics;  Laterality: Right;  120   UTERINE FIBROID SURGERY      Current Medications: Current Meds  Medication Sig   ACCU-CHEK GUIDE TEST test strip CHECK BLOOD SUGAR ONCE DAILY   Accu-Chek Softclix Lancets lancets CHECK BLOOD SUGAR ONCE DAILY   Alpha-Lipoic Acid 300 MG CAPS Take 600 mg by mouth daily.   amLODipine  (NORVASC ) 10 MG tablet Take 1 tablet (10 mg total) by mouth daily.   Ascorbic Acid (VITAMIN C) 1000 MG tablet Take 1,000 mg by mouth daily.   aspirin  81 MG tablet Take 81 mg by mouth daily.   atorvastatin  (LIPITOR) 40 MG tablet TAKE 1 TABLET BY MOUTH DAILY   Biotin w/ Vitamins C & E (HAIR/SKIN/NAILS PO) Take 6,000 mcg by mouth daily.   Blood Pressure Monitor MISC Use to check blood pressure daily.  Please include a large cuff   brimonidine -timolol  (COMBIGAN ) 0.2-0.5 % ophthalmic solution Apply 1 drop to eye every 12 (twelve) hours.   carvedilol  (COREG ) 12.5 MG tablet TAKE 1 TABLET BY MOUTH TWICE A DAY   Cholecalciferol (VITAMIN D3) 125 MCG (5000 UT) CAPS Take 1 capsule (5,000 Units total) by mouth daily.   cloNIDine  (CATAPRES ) 0.2 MG tablet Take 1 tablet (0.2 mg total) by mouth  3 (three) times daily.   Cobalamin Combinations (B-12) [phone] MCG SUBL Place 5,000 mcg under the tongue daily.   diclofenac  Sodium (VOLTAREN ) 1 % GEL Apply 4 g topically 4 (four) times daily.   ferrous sulfate  324 MG TBEC Take 324 mg by mouth daily.   gabapentin  (NEURONTIN ) 400 MG capsule TAKE 2 CAPSULES BY MOUTH TWICE  DAILY   irbesartan  (AVAPRO ) 300 MG tablet TAKE 1 TABLET BY MOUTH DAILY   isosorbide  mononitrate (IMDUR ) 60 MG 24 hr tablet Take 1 tablet (60 mg total) by mouth daily.   methimazole  (TAPAZOLE ) 5 MG tablet Take 2.5 mg by mouth daily.   Multiple Vitamin (MULTIVITAMIN ADULT  PO) Take 2 tablets by mouth daily.   oxybutynin  (DITROPAN -XL) 10 MG 24 hr tablet Take 1 tablet (10 mg total) by mouth at bedtime.   potassium chloride  SA (KLOR-CON  M) 20 MEQ tablet TAKE 1 TABLET BY MOUTH DAILY   prednisoLONE  acetate (PRED FORTE ) 1 % ophthalmic suspension Place 1 drop into both eyes 4 (four) times daily.   Semaglutide , 1 MG/DOSE, 4 MG/3ML SOPN Inject 1 mg into the skin once a week.   torsemide  (DEMADEX ) 10 MG tablet Take 1 tablet (10 mg total) by mouth daily as needed for lower extremity swelling   Turmeric 500 MG CAPS Take 500 mg by mouth daily.     Allergies:   Patient has no known allergies.   Social History   Socioeconomic History   Marital status: Married    Spouse name: Franky   Number of children: 0   Years of education: 16   Highest education level: Bachelor's degree (e.g., BA, AB, BS)  Occupational History   Occupation: customer service  Tobacco Use   Smoking status: Never   Smokeless tobacco: Never  Vaping Use   Vaping status: Never Used  Substance and Sexual Activity   Alcohol  use: No    Alcohol /week: 0.0 standard drinks of alcohol     Comment: rarely   Drug use: No   Sexual activity: Not Currently    Birth control/protection: Post-menopausal  Other Topics Concern   Not on file  Social History Narrative   Lives with husband   Caffeine use: 16oz coffee per day   Social Drivers of Health   Financial Resource Strain: Medium Risk (01/11/2024)   Overall Financial Resource Strain (CARDIA)    Difficulty of Paying Living Expenses: Somewhat hard  Food Insecurity: No Food Insecurity (01/11/2024)   Hunger Vital Sign    Worried About Running Out of Food in the Last Year: Never true    Ran Out of Food in the Last Year: Never true  Transportation Needs: Unmet Transportation Needs (01/11/2024)   PRAPARE - Administrator, Civil Service (Medical): Yes    Lack of Transportation (Non-Medical): Not on file  Physical Activity: Inactive (06/29/2023)    Exercise Vital Sign    Days of Exercise per Week: 0 days    Minutes of Exercise per Session: 0 min  Stress: No Stress Concern Present (01/11/2024)   [employer] of Occupational Health - Occupational Stress Questionnaire    Feeling of Stress: Only a little  Social Connections: Moderately Integrated (01/11/2024)   Social Connection and Isolation Panel    Frequency of Communication with Friends and Family: More than three times a week    Frequency of Social Gatherings with Friends and Family: Patient declined    Attends Religious Services: 1 to 4 times per year    Active Member of Clubs or  Organizations: No    Attends Engineer, Structural: Not on file    Marital Status: Married     Family History: The patient's family history includes Breast cancer in her cousin; Cancer in her sister; Diabetes in her maternal aunt and maternal uncle; Hyperlipidemia in her maternal aunt and mother; Hypertension in her mother.  ROS:   Review of Systems  Constitution: Negative for decreased appetite, fever and weight gain.  HENT: Negative for congestion, ear discharge, hoarse voice and sore throat.   Eyes: Negative for discharge, redness, vision loss in right eye and visual halos.  Cardiovascular: Negative for chest pain, dyspnea on exertion, leg swelling, orthopnea and palpitations.  Respiratory: Negative for cough, hemoptysis, shortness of breath and snoring.   Endocrine: Negative for heat intolerance and polyphagia.  Hematologic/Lymphatic: Negative for bleeding problem. Does not bruise/bleed easily.  Skin: Negative for flushing, nail changes, rash and suspicious lesions.  Musculoskeletal: Negative for arthritis, joint pain, muscle cramps, myalgias, neck pain and stiffness.  Gastrointestinal: Negative for abdominal pain, bowel incontinence, diarrhea and excessive appetite.  Genitourinary: Negative for decreased libido, genital sores and incomplete emptying.  Neurological: Negative for brief  paralysis, focal weakness, headaches and loss of balance.  Psychiatric/Behavioral: Negative for altered mental status, depression and suicidal ideas.  Allergic/Immunologic: Negative for HIV exposure and persistent infections.    EKGs/Labs/Other Studies Reviewed:    The following studies were reviewed today:   EKG:  The ekg ordered today demonstrates sinus bradycardia with occasional pvc  TTE 11/28/2021 IMPRESSIONS     1. Left ventricular ejection fraction, by estimation, is 65 to 70%. The  left ventricle has normal function. The left ventricle has no regional  wall motion abnormalities. Left ventricular diastolic parameters are  consistent with Grade II diastolic  dysfunction (pseudonormalization). Elevated left atrial pressure.   2. Right ventricular systolic function is normal. The right ventricular  size is mildly enlarged. There is moderately elevated pulmonary artery  systolic pressure. The estimated right ventricular systolic pressure is  55.9 mmHg.   3. The mitral valve is normal in structure. Trivial mitral valve  regurgitation. No evidence of mitral stenosis.   4. Tricuspid valve regurgitation is mild to moderate.   5. The aortic valve was not well visualized. Aortic valve regurgitation  is not visualized. Aortic valve sclerosis is present, with no evidence of  aortic valve stenosis.   6. The inferior vena cava is normal in size with <50% respiratory  variability, suggesting right atrial pressure of 8 mmHg.   FINDINGS   Left Ventricle: Left ventricular ejection fraction, by estimation, is 65  to 70%. The left ventricle has normal function. The left ventricle has no  regional wall motion abnormalities. The left ventricular internal cavity  size was normal in size. There is   no left ventricular hypertrophy. Left ventricular diastolic parameters  are consistent with Grade II diastolic dysfunction (pseudonormalization).  Elevated left atrial pressure.   Right  Ventricle: The right ventricular size is mildly enlarged. No  increase in right ventricular wall thickness. Right ventricular systolic  function is normal. There is moderately elevated pulmonary artery systolic  pressure. The tricuspid regurgitant  velocity is 3.46 m/s, and with an assumed right atrial pressure of 8 mmHg,  the estimated right ventricular systolic pressure is 55.9 mmHg.   Left Atrium: Left atrial size was normal in size.   Right Atrium: Right atrial size was normal in size.   Pericardium: Trivial pericardial effusion is present.   Mitral Valve: The  mitral valve is normal in structure. Trivial mitral  valve regurgitation. No evidence of mitral valve stenosis.   Tricuspid Valve: The tricuspid valve is normal in structure. Tricuspid  valve regurgitation is mild to moderate.   Aortic Valve: The aortic valve was not well visualized. Aortic valve  regurgitation is not visualized. Aortic valve sclerosis is present, with  no evidence of aortic valve stenosis.   Pulmonic Valve: The pulmonic valve was not well visualized. Pulmonic valve  regurgitation is not visualized.   Aorta: The aortic root and ascending aorta are structurally normal, with  no evidence of dilitation.   Venous: The inferior vena cava is normal in size with less than 50%  respiratory variability, suggesting right atrial pressure of 8 mmHg.   IAS/Shunts: The interatrial septum was not well visualized.    Medication Adjustments/Labs and Tests Ordered: Current medicines are reviewed at length with the patient today.  Concerns regarding medicines are outlined above.  Tests Ordered: No orders of the defined types were placed in this encounter.  Medication Changes: No orders of the defined types were placed in this encounter.   CCTA March 03, 2021 FINDINGS: Image quality: Poor quality study due to early trigger of the contrast bolus. The contrast bolus is largely timed for the right ventricular with  significant SVC enhancement.   Noise artifact is: Moderate signal to noise artifact due to poor contrast bolus timing.   Coronary Arteries:  Normal coronary origin.  Right dominance.   Left main: The left main is a large caliber vessel with a normal take off from the left coronary cusp that bifurcates to form a left anterior descending artery and a left circumflex artery.   Left anterior descending artery: The proximal LAD contains minimal calcified plaque (<25%). The mid and distal segments appear patent. The first diagonal branch contains minimal calcified plaque (<25%).   Left circumflex artery: The LCX is non-dominant and patent with no evidence of plaque or stenosis. The LCX gives off 3 patent obtuse marginal branches.   Right coronary artery: The RCA is dominant with normal take off from the right coronary cusp. The ostial RCA contains mild mixed density plaque (25-49%). The proximal segment contains minimal calcified plaque (<25%). The mid segment is patent. The distal RCA contains minimal calcified plaque (<25%). The RCA terminates as a PDA and right posterolateral branch without evidence of plaque or stenosis.   Right Atrium: Right atrial size is within normal limits.   Right Ventricle: The right ventricular cavity is within normal limits.   Left Atrium: Left atrial size is normal in size with no left atrial appendage filling defect.   Left Ventricle: The ventricular cavity size is within normal limits. There are no stigmata of prior infarction. There is no abnormal filling defect.   Pulmonary arteries: Normal in size without proximal filling defect.   Pulmonary veins: Normal pulmonary venous drainage.   Pericardium: Normal thickness with no significant effusion or calcium  present.   Cardiac valves: The aortic valve is trileaflet without significant calcification. The mitral valve is normal structure with minimal annular calcium .   Aorta: Normal caliber with  no significant disease.   Extra-cardiac findings: See attached radiology report for non-cardiac structures.   IMPRESSION: 1. Poor quality study due to early trigger of the contrast bolus. The contrast bolus is largely timed for the right ventricular with significant SVC enhancement. Study remains interpretable.   2. Coronary calcium  score of 418. This was 93rd percentile for age-, sex, and race-matched controls.  3. Normal coronary origin with right dominance.   4. Mild mixed density plaque (25-49%) in the ostial RCA.   5. Minimal calcified plaque (<25%) in the proximal LAD.   RECOMMENDATIONS: 1. Mild non-obstructive CAD (25-49%) in the ostial RCA. CT FFR will be sent due to ostial location. Consider preventive therapy and risk factor modification.   Darryle Decent, MD   Echo August 2022 FINDINGS   Left Ventricle: Left ventricular ejection fraction, by estimation, is 60  to 65%. The left ventricle has normal function. The left ventricle has no  regional wall motion abnormalities. Definity  contrast agent was given IV  to delineate the left ventricular   endocardial borders. The left ventricular internal cavity size was normal  in size. There is no left ventricular hypertrophy. Left ventricular  diastolic parameters are consistent with Grade II diastolic dysfunction  (pseudonormalization). Elevated left  atrial pressure.   Right Ventricle: The right ventricular size is normal. No increase in  right ventricular wall thickness. Right ventricular systolic function is  normal. There is moderately elevated pulmonary artery systolic pressure.  The tricuspid regurgitant velocity is  3.35 m/s, and with an assumed right atrial pressure of 8 mmHg, the  estimated right ventricular systolic pressure is 52.9 mmHg.   Left Atrium: Left atrial size was mildly dilated.   Right Atrium: Right atrial size was normal in size.   Pericardium: There is no evidence of pericardial effusion.    Mitral Valve: The mitral valve is normal in structure. Trivial mitral  valve regurgitation. No evidence of mitral valve stenosis.   Tricuspid Valve: The tricuspid valve is normal in structure. Tricuspid  valve regurgitation is mild to moderate. No evidence of tricuspid  stenosis.   Aortic Valve: The aortic valve is normal in structure. Aortic valve  regurgitation is not visualized. No aortic stenosis is present.   Pulmonic Valve: The pulmonic valve was normal in structure. Pulmonic valve  regurgitation is not visualized. No evidence of pulmonic stenosis.   Aorta: The aortic root is normal in size and structure.   Venous: The inferior vena cava is normal in size with greater than 50%  respiratory variability, suggesting right atrial pressure of 3 mmHg.   IAS/Shunts: No atrial level shunt detected by color flow Doppler.       Recent Labs: 01/12/2024: ALT 14; BUN 19; Creatinine, Ser 1.62; Hemoglobin 10.6; Platelets 273.0; Potassium 4.0; Sodium 138; TSH 4.49  Recent Lipid Panel    Component Value Date/Time   CHOL 144 01/12/2024 0939   CHOL 129 07/02/2021 0944   TRIG 68.0 01/12/2024 0939   HDL 61.00 01/12/2024 0939   HDL 56 07/02/2021 0944   CHOLHDL 2 01/12/2024 0939   VLDL 13.6 01/12/2024 0939   LDLCALC 70 01/12/2024 0939   LDLCALC 59 07/02/2021 0944    Physical Exam:    VS:  BP (!) 148/86   Pulse 64   Ht 5' 5 (1.651 m)   Wt 243 lb 9.6 oz (110.5 kg)   SpO2 96%   BMI 40.54 kg/m     Wt Readings from Last 3 Encounters:  06/01/24 243 lb 9.6 oz (110.5 kg)  04/10/24 249 lb (112.9 kg)  02/01/24 247 lb 12.8 oz (112.4 kg)     GEN: Well nourished, well developed in no acute distress HEENT: Normal NECK: No JVD; No carotid bruits LYMPHATICS: No lymphadenopathy CARDIAC: S1S2 noted,RRR, no murmurs, rubs, gallops RESPIRATORY:  Clear to auscultation without rales, wheezing or rhonchi  ABDOMEN: Soft, non-tender, non-distended, +bowel sounds,  no guarding. EXTREMITIES: No  edema, No cyanosis, no clubbing MUSCULOSKELETAL:  No deformity  SKIN: Warm and dry NEUROLOGIC:  Alert and oriented x 3, non-focal PSYCHIATRIC:  Normal affect, good insight  ASSESSMENT:    1. Hypertensive heart disease without heart failure   2. Lymphedema   3. Coronary artery disease involving native coronary artery of native heart without angina pectoris   4. Insulin -requiring or dependent type II diabetes mellitus (HCC)      PLAN:    Hypertension -.  From a manually high blood pressure was 148/86 mmHg.  Will not change any of her antihypertensive medication given the fact that she is dropping into the 130s at times.  Will keep her on her current regimen of irbesartan  300 mg, carvedilol  12.5 mg twice daily, amlodipine  10 mg daily, clonidine  0.2 mg 3 times a day isosorbide  60 mg daily.  And torsemide  10 mg daily  CAD - previous CCTA with mild CAD in 2022. I will increase her Imdur  and if she does not improve by her next visit I will proceed with an ischemic evaluation given her high risk for progression.   Diabetes mellitus-she follows with endocrine at [facility]   The patient understands the need to lose weight with diet and exercise. We have discussed specific strategies for this.  The patient is in agreement with the above plan. The patient left the office in stable condition.  The patient will follow up in   Medication Adjustments/Labs and Tests Ordered: Current medicines are reviewed at length with the patient today.  Concerns regarding medicines are outlined above.  No orders of the defined types were placed in this encounter.  No orders of the defined types were placed in this encounter.   Patient Instructions  Medication Instructions:  Your physician recommends that you continue on your current medications as directed. Please refer to the Current Medication list given to you today.  *If you need a refill on your cardiac medications before your next appointment,  please call your pharmacy*  Follow-Up: At [facility] you and your health needs are our priority.  As part of our continuing mission to provide you with exceptional heart care, our providers are all part of one team.  This team includes your primary Cardiologist (physician) and Advanced Practice Providers or APPs (Physician Assistants and Nurse Practitioners) who all work together to provide you with the care you need, when you need it.  Your next appointment:   6 month(s)  Provider:   Aeden Matranga, Emily Wood       Adopting a Healthy Lifestyle.  Know what a healthy weight is for you (roughly BMI <25) and aim to maintain this   Aim for 7+ servings of fruits and vegetables daily   65-80+ fluid ounces of water or unsweet tea for healthy kidneys   Limit to max 1 drink of alcohol  per day; avoid smoking/tobacco   Limit animal fats in diet for cholesterol and heart health - choose grass fed whenever available   Avoid highly processed foods, and foods high in saturated/trans fats   Aim for low stress - take time to unwind and care for your mental health   Aim for 150 min of moderate intensity exercise weekly for heart health, and weights twice weekly for bone health   Aim for 7-9 hours of sleep daily   When it comes to diets, agreement about the perfect plan isnt easy to find, even among the experts. Experts at the [employer]  of Public [employer] developed an idea known as the Healthy Eating Plate. Just imagine a plate divided into logical, healthy portions.   The emphasis is on diet quality:   Load up on vegetables and fruits - one-half of your plate: Aim for color and variety, and remember that potatoes dont count.   Go for whole grains - one-quarter of your plate: Whole wheat, barley, wheat berries, quinoa, oats, brown rice, and foods made with them. If you want pasta, go with whole wheat pasta.   Protein power - one-quarter of your plate: Fish, chicken, beans, and nuts are  all healthy, versatile protein sources. Limit red meat.   The diet, however, does go beyond the plate, offering a few other suggestions.   Use healthy plant oils, such as olive, canola, soy, corn, sunflower and peanut. Check the labels, and avoid partially hydrogenated oil, which have unhealthy trans fats.   If youre thirsty, drink water. Coffee and tea are good in moderation, but skip sugary drinks and limit milk and dairy products to one or two daily servings.   The type of carbohydrate in the diet is more important than the amount. Some sources of carbohydrates, such as vegetables, fruits, whole grains, and beans-are healthier than others.   Finally, stay active  Signed, Emily Huntsman, Emily Wood  06/01/2024 8:38 AM    [facility]

## 2024-06-01 NOTE — Patient Instructions (Signed)
 Medication Instructions:  Your physician recommends that you continue on your current medications as directed. Please refer to the Current Medication list given to you today.  *If you need a refill on your cardiac medications before your next appointment, please call your pharmacy*  Follow-Up: At [facility], you and your health needs are our priority.  As part of our continuing mission to provide you with exceptional heart care, our providers are all part of one team.  This team includes your primary Cardiologist (physician) and Advanced Practice Providers or APPs (Physician Assistants and Nurse Practitioners) who all work together to provide you with the care you need, when you need it.  Your next appointment:   6 month(s)  Provider:   Thomasene Ripple, DO

## 2024-06-24 ENCOUNTER — Other Ambulatory Visit: Payer: Self-pay | Admitting: Family Medicine

## 2024-06-24 ENCOUNTER — Other Ambulatory Visit: Payer: Self-pay | Admitting: Cardiology

## 2024-06-24 DIAGNOSIS — I10 Essential (primary) hypertension: Secondary | ICD-10-CM

## 2024-07-04 ENCOUNTER — Ambulatory Visit: Payer: Medicare Other

## 2024-07-04 VITALS — BP 134/72 | HR 77 | Temp 97.5°F | Ht 65.0 in | Wt 233.0 lb

## 2024-07-04 DIAGNOSIS — Z0000 Encounter for general adult medical examination without abnormal findings: Secondary | ICD-10-CM

## 2024-07-04 NOTE — Progress Notes (Signed)
 Chief Complaint  Patient presents with   Medicare Wellness     Subjective:   Emily Wood is a 73 y.o. female who presents for a Medicare Annual Wellness Visit.  Visit info / Clinical Intake: Medicare Wellness Visit Type:: Subsequent Annual Wellness Visit Persons participating in visit and providing information:: patient Medicare Wellness Visit Mode:: In-person (required for WTM) Interpreter Needed?: No Pre-visit prep was completed: yes AWV questionnaire completed by patient prior to visit?: yes Date:: 07/04/24 Living arrangements:: lives with spouse/significant other Patient's Overall Health Status Rating: (!) fair Typical amount of pain: some (Followed by medical attention) Does pain affect daily life?: (!) yes (Followed by medical attention)  Dietary Habits and Nutritional Risks How many meals a day?: 2 Eats fruit and vegetables daily?: (!) no Most meals are obtained by: preparing own meals In the last 2 weeks, have you had any of the following?: none Diabetic:: (!) yes Any non-healing wounds?: no How often do you check your BS?: as needed Would you like to be referred to a Nutritionist or for Diabetic Management? : no  Functional Status Activities of Daily Living (to include ambulation/medication): Independent Ambulation: Independent with device- listed below Home Assistive Devices/Equipment: Eyeglasses; Cane Medication Administration: Independent Home Management (perform basic housework or laundry): Needs assistance (comment) Manage your own finances?: yes Primary transportation is: driving; family / friends; facility / other Concerns about vision?: (!) yes (Followed by medical attention) Concerns about hearing?: no  Fall Screening Falls in the past year?: 1 Number of falls in past year: 0 Was there an injury with Fall?: 1 (Knee injury. Followed by medical attention) Fall Risk Category Calculator: 2 Patient Fall Risk Level: Moderate Fall Risk  Fall  Risk Patient at Risk for Falls Due to: Other (Comment) (Loss balance. Followed by medical attention) Fall risk Follow up: Falls evaluation completed  Home and Transportation Safety: All rugs have non-skid backing?: yes All stairs or steps have railings?: yes Grab bars in the bathtub or shower?: yes Have non-skid surface in bathtub or shower?: yes Good home lighting?: yes Regular seat belt use?: yes Hospital stays in the last year:: no  Cognitive Assessment Difficulty concentrating, remembering, or making decisions? : no Will 6CIT or Mini Cog be Completed: yes What year is it?: 0 points What month is it?: 0 points Give patient an address phrase to remember (5 components): [address] [place]  About what time is it?: 0 points Count backwards from 20 to 1: 0 points Say the months of the year in reverse: 0 points Repeat the address phrase from earlier: 0 points 6 CIT Score: 0 points  Advance Directives (For Healthcare) Does Patient Have a Medical Advance Directive?: No Would patient like information on creating a medical advance directive?: No - Patient declined  Reviewed/Updated  Reviewed/Updated: Reviewed All (Medical, Surgical, Family, Medications, Allergies, Care Teams, Patient Goals)    Allergies (verified) Patient has no known allergies.   Current Medications (verified) Outpatient Encounter Medications as of 07/04/2024  Medication Sig   ACCU-CHEK GUIDE TEST test strip CHECK BLOOD SUGAR ONCE DAILY   Accu-Chek Softclix Lancets lancets CHECK BLOOD SUGAR ONCE DAILY   Alpha-Lipoic Acid 300 MG CAPS Take 600 mg by mouth daily.   amLODipine  (NORVASC ) 10 MG tablet TAKE 1 TABLET BY MOUTH DAILY   Ascorbic Acid (VITAMIN C) 1000 MG tablet Take 1,000 mg by mouth daily.   aspirin  81 MG tablet Take 81 mg by mouth daily.   atorvastatin  (LIPITOR) 40 MG tablet TAKE  1 TABLET BY MOUTH DAILY   Biotin w/ Vitamins C & E (HAIR/SKIN/NAILS PO) Take 6,000 mcg by mouth daily.   Blood  Pressure Monitor MISC Use to check blood pressure daily.  Please include a large cuff   brimonidine -timolol  (COMBIGAN ) 0.2-0.5 % ophthalmic solution Apply 1 drop to eye every 12 (twelve) hours.   carvedilol  (COREG ) 12.5 MG tablet TAKE 1 TABLET BY MOUTH TWICE A DAY   Cholecalciferol (VITAMIN D3) 125 MCG (5000 UT) CAPS Take 1 capsule (5,000 Units total) by mouth daily.   cloNIDine  (CATAPRES ) 0.2 MG tablet TAKE 1 TABLET BY MOUTH 3 TIMES  DAILY   Cobalamin Combinations (B-12) [phone] MCG SUBL Place 5,000 mcg under the tongue daily.   diclofenac  Sodium (VOLTAREN ) 1 % GEL Apply 4 g topically 4 (four) times daily.   ferrous sulfate  324 MG TBEC Take 324 mg by mouth daily.   gabapentin  (NEURONTIN ) 400 MG capsule TAKE 2 CAPSULES BY MOUTH TWICE  DAILY   irbesartan  (AVAPRO ) 300 MG tablet TAKE 1 TABLET BY MOUTH DAILY   isosorbide  mononitrate (IMDUR ) 60 MG 24 hr tablet Take 1 tablet (60 mg total) by mouth daily.   methimazole  (TAPAZOLE ) 5 MG tablet Take 2.5 mg by mouth daily.   Multiple Vitamin (MULTIVITAMIN ADULT PO) Take 2 tablets by mouth daily.   nystatin  cream (MYCOSTATIN ) Apply 1 application topically 2 (two) times daily. (Patient not taking: Reported on 06/01/2024)   oxybutynin  (DITROPAN -XL) 10 MG 24 hr tablet Take 1 tablet (10 mg total) by mouth at bedtime.   potassium chloride  SA (KLOR-CON  M) 20 MEQ tablet TAKE 1 TABLET BY MOUTH DAILY   prednisoLONE  acetate (PRED FORTE ) 1 % ophthalmic suspension Place 1 drop into both eyes 4 (four) times daily.   Semaglutide , 1 MG/DOSE, 4 MG/3ML SOPN Inject 1 mg into the skin once a week.   torsemide  (DEMADEX ) 10 MG tablet Take 1 tablet (10 mg total) by mouth daily as needed for lower extremity swelling   Turmeric 500 MG CAPS Take 500 mg by mouth daily.   No facility-administered encounter medications on file as of 07/04/2024.    History: Past Medical History:  Diagnosis Date   Arthritis    Diabetes mellitus without complication (HCC)    Family history of  adverse reaction to anesthesia    PONV   Graves disease 04/14/2020   Hypertension    Lymphedema    Mixed hyperlipidemia 02/20/2021   Past Surgical History:  Procedure Laterality Date   ABDOMINAL HYSTERECTOMY     Partial   CATARACT EXTRACTION W/PHACO Right 04/14/2022   Procedure: CATARACT EXTRACTION PHACO AND INTRAOCULAR LENS PLACEMENT (IOC) RIGHT DIABETIC 10.93 00:53.0;  Surgeon: Jaye Fallow, MD;  Location: [facility];  Service: Ophthalmology;  Laterality: Right;   CATARACT EXTRACTION W/PHACO Left 04/28/2022   Procedure: CATARACT EXTRACTION PHACO AND INTRAOCULAR LENS PLACEMENT (IOC) LEFT DIABETIC 6.05 00:45.7;  Surgeon: Jaye Fallow, MD;  [facility];  Service: Ophthalmology;  Laterality: Left;  Diabetic   CHOLECYSTECTOMY     EYE SURGERY  02/24   HERNIA REPAIR     OTHER SURGICAL HISTORY     scar tissue removal from bowels   PARTIAL HYSTERECTOMY     Still has ovaries   REVERSE SHOULDER ARTHROPLASTY Right 09/11/2022   Procedure: REVERSE SHOULDER ARTHROPLASTY;  Surgeon: Sharl Selinda Dover, MD;  Location: [facility];  Service: Orthopedics;  Laterality: Right;  120   UTERINE FIBROID SURGERY     Family History  Problem Relation Age of Onset  Hyperlipidemia Mother    Hypertension Mother    Cancer Sister    Hyperlipidemia Maternal Aunt    Diabetes Maternal Aunt    Breast cancer Cousin    Diabetes Maternal Uncle    Social History   Occupational History   Occupation: customer service  Tobacco Use   Smoking status: Never   Smokeless tobacco: Never  Vaping Use   Vaping status: Never Used  Substance and Sexual Activity   Alcohol  use: No    Alcohol /week: 0.0 standard drinks of alcohol     Comment: rarely   Drug use: No   Sexual activity: Not Currently    Birth control/protection: Post-menopausal   Tobacco Counseling Counseling given: No  SDOH Screenings   Food Insecurity: No Food Insecurity (07/04/2024)  Housing: Unknown (07/04/2024)   Transportation Needs: Unmet Transportation Needs (07/04/2024)  Utilities: Not At Risk (07/04/2024)  Alcohol  Screen: Low Risk (07/04/2024)  Depression (PHQ2-9): Low Risk (07/04/2024)  Financial Resource Strain: Medium Risk (07/04/2024)  Physical Activity: Inactive (07/04/2024)  Social Connections: Moderately Integrated (07/04/2024)  Stress: No Stress Concern Present (07/04/2024)  Tobacco Use: Low Risk (07/04/2024)  Health Literacy: Adequate Health Literacy (07/04/2024)   See flowsheets for full screening details  Depression Screen PHQ 2 & 9 Depression Scale- Over the past 2 weeks, how often have you been bothered by any of the following problems? Little interest or pleasure in doing things: 0 Feeling down, depressed, or hopeless (PHQ Adolescent also includes...irritable): 0 PHQ-2 Total Score: 0     Goals Addressed               This Visit's Progress     Increase physical activity (pt-stated)        Lose weight.             Objective:    Today's Vitals   07/04/24 1101  BP: 134/72  Pulse: 77  Temp: (!) 97.5 F (36.4 C)  TempSrc: Oral  SpO2: 99%  Weight: 233 lb (105.7 kg)  Height: 5' 5 (1.651 m)   Body mass index is 38.77 kg/m.  Hearing/Vision screen Hearing Screening - Comments:: Denies hearing difficulties   Vision Screening - Comments:: Wears rx glasses - up to date with routine eye exams with [facility] Immunizations and Health Maintenance Health Maintenance  Topic Date Due   Diabetic kidney evaluation - Urine ACR  Never done   Zoster Vaccines- Shingrix (1 of 2) Never done   FOOT EXAM  10/26/2023   Influenza Vaccine  02/18/2024   COVID-19 Vaccine (6 - 2025-26 season) 03/20/2024   HEMOGLOBIN A1C  07/13/2024   Diabetic kidney evaluation - eGFR measurement  01/11/2025   OPHTHALMOLOGY EXAM  06/27/2025   Medicare Annual Wellness (AWV)  07/04/2025   Mammogram  11/04/2025   Fecal DNA (Cologuard)  11/12/2025   DTaP/Tdap/Td (2 - Td or Tdap)  01/01/2027   Pneumococcal Vaccine: 50+ Years  Completed   Bone Density Scan  Completed   Hepatitis C Screening  Completed   Meningococcal B Vaccine  Aged Out        Assessment/Plan:  This is a routine wellness examination for Emily Wood.  Patient Care Team: Copland, Harlene BROCKS, MD as PCP - General (Family Medicine) Tobb, Kardie, DO as PCP - Cardiology (Cardiology)  I have personally reviewed and noted the following in the patients chart:   Medical and social history Use of alcohol , tobacco or illicit drugs  Current medications and supplements including opioid prescriptions. Functional ability and status Nutritional status  Physical activity Advanced directives List of other physicians Hospitalizations, surgeries, and ER visits in previous 12 months Vitals Screenings to include cognitive, depression, and falls Referrals and appointments  No orders of the defined types were placed in this encounter.  In addition, I have reviewed and discussed with patient certain preventive protocols, quality metrics, and best practice recommendations. A written personalized care plan for preventive services as well as general preventive health recommendations were provided to patient.   Emily LELON Blush, LPN   [id number]/[id number]   Return in 53 weeks (on 07/10/2025).  After Visit Summary: (In Person-Declined) Patient declined AVS at this time.  Nurse Notes: HM Addressed: Patient deferred Vaccines

## 2024-07-04 NOTE — Patient Instructions (Addendum)
 Emily Wood,  Thank you for taking the time for your Medicare Wellness Visit. I appreciate your continued commitment to your health goals. Please review the care plan we discussed, and feel free to reach out if I can assist you further.  Please note that Annual Wellness Visits do not include a physical exam. Some assessments may be limited, especially if the visit was conducted virtually. If needed, we may recommend an in-person follow-up with your provider.  Ongoing Care Seeing your primary care provider every 3 to 6 months helps us  monitor your health and provide consistent, personalized care.   Referrals If a referral was made during today's visit and you haven't received any updates within two weeks, please contact the referred provider directly to check on the status.  Recommended Screenings:  Health Maintenance  Topic Date Due   Yearly kidney health urinalysis for diabetes  Never done   Zoster (Shingles) Vaccine (1 of 2) Never done   Complete foot exam   10/26/2023   Flu Shot  02/18/2024   COVID-19 Vaccine (6 - 2025-26 season) 03/20/2024   Hemoglobin A1C  07/13/2024   Yearly kidney function blood test for diabetes  01/11/2025   Eye exam for diabetics  06/27/2025   Medicare Annual Wellness Visit  07/04/2025   Breast Cancer Screening  11/04/2025   Cologuard (Stool DNA test)  11/12/2025   DTaP/Tdap/Td vaccine (2 - Td or Tdap) 01/01/2027   Pneumococcal Vaccine for age over 49  Completed   Osteoporosis screening with Bone Density Scan  Completed   Hepatitis C Screening  Completed   Meningitis B Vaccine  Aged Out       07/04/2024   11:12 AM  Advanced Directives  Does Patient Have a Medical Advance Directive? No  Would patient like information on creating a medical advance directive? No - Patient declined    Vision: Annual vision screenings are recommended for early detection of glaucoma, cataracts, and diabetic retinopathy. These exams can also reveal signs of chronic  conditions such as diabetes and high blood pressure.  Dental: Annual dental screenings help detect early signs of oral cancer, gum disease, and other conditions linked to overall health, including heart disease and diabetes.  Please see the attached documents for additional preventive care recommendations.

## 2024-07-17 ENCOUNTER — Other Ambulatory Visit: Payer: Self-pay | Admitting: Cardiology

## 2024-07-19 MED ORDER — ISOSORBIDE MONONITRATE ER 60 MG PO TB24: 60.0000 mg | ORAL_TABLET | Freq: Every day | ORAL | 3 refills | Status: AC

## 2024-08-02 ENCOUNTER — Other Ambulatory Visit: Payer: Self-pay

## 2024-08-21 NOTE — Progress Notes (Unsigned)
 "                              MRN : [id number]  Emily Wood is a 74 y.o. ([birth year]) female who presents with chief complaint of legs swell.  History of Present Illness:   The patient returns to the office for followup evaluation regarding leg swelling.  The swelling has persisted but with the lymph pump is controlled. The pain associated with swelling is essentially eliminated . There have not been any interval development of a ulcerations or wounds.   The patient notes some problems with the pump, noting it is not working well and the leggings are in poor condition.  She notes that the machine itself is now 73 years old.   Since the previous visit the patient has been wearing graduated compression stockings and using the lymph pump on a routine basis and  has noted significant improvement in the lymphedema.    Patient stated the lymph pump has been a very positive factor in her care.     Active Medications[1]  Past Medical History:  Diagnosis Date   Arthritis    Diabetes mellitus without complication (HCC)    Family history of adverse reaction to anesthesia    PONV   Graves disease 04/14/2020   Hypertension    Lymphedema    Mixed hyperlipidemia 02/20/2021    Past Surgical History:  Procedure Laterality Date   ABDOMINAL HYSTERECTOMY     Partial   CATARACT EXTRACTION W/PHACO Right 04/14/2022   Procedure: CATARACT EXTRACTION PHACO AND INTRAOCULAR LENS PLACEMENT (IOC) RIGHT DIABETIC 10.93 00:53.0;  Surgeon: Jaye Fallow, MD;  Location: [facility];  Service: Ophthalmology;  Laterality: Right;   CATARACT EXTRACTION W/PHACO Left 04/28/2022   Procedure: CATARACT EXTRACTION PHACO AND INTRAOCULAR LENS PLACEMENT (IOC) LEFT DIABETIC 6.05 00:45.7;  Surgeon: Jaye Fallow, MD;  Location: [facility];  Service: Ophthalmology;  Laterality: Left;  Diabetic   CHOLECYSTECTOMY     EYE SURGERY  02/24   HERNIA REPAIR     OTHER SURGICAL HISTORY     scar tissue  removal from bowels   PARTIAL HYSTERECTOMY     Still has ovaries   REVERSE SHOULDER ARTHROPLASTY Right 09/11/2022   Procedure: REVERSE SHOULDER ARTHROPLASTY;  Surgeon: Sharl Selinda Dover, MD;  Location: [facility];  Service: Orthopedics;  Laterality: Right;  120   UTERINE FIBROID SURGERY      Social History Social History[2]  Family History Family History  Problem Relation Age of Onset   Hyperlipidemia Mother    Hypertension Mother    Cancer Sister    Hyperlipidemia Maternal Aunt    Diabetes Maternal Aunt    Breast cancer Cousin    Diabetes Maternal Uncle     Allergies[3]   REVIEW OF SYSTEMS (Negative unless checked)  Constitutional: [] Weight loss  [] Fever  [] Chills Cardiac: [] Chest pain   [] Chest pressure   [] Palpitations   [] Shortness of breath when laying flat   [] Shortness of breath with exertion. Vascular:  [] Pain in legs with walking   [x] Pain in legs with standing  [] History of DVT   [] Phlebitis   [x] Swelling in legs   [] Varicose veins   [] Non-healing ulcers Pulmonary:   [] Uses home oxygen   [] Productive cough   [] Hemoptysis   [] Wheeze  [] COPD   [] Asthma Neurologic:  [] Dizziness   [] Seizures   [] History of stroke   [] History of TIA  [] Aphasia   [] Vissual changes   []   Weakness or numbness in arm   [] Weakness or numbness in leg Musculoskeletal:   [] Joint swelling   [] Joint pain   [] Low back pain Hematologic:  [] Easy bruising  [] Easy bleeding   [] Hypercoagulable state   [] Anemic Gastrointestinal:  [] Diarrhea   [] Vomiting  [] Gastroesophageal reflux/heartburn   [] Difficulty swallowing. Genitourinary:  [] Chronic kidney disease   [] Difficult urination  [] Frequent urination   [] Blood in urine Skin:  [] Rashes   [] Ulcers  Psychological:  [] History of anxiety   []  History of major depression.  Physical Examination  There were no vitals filed for this visit. There is no height or weight on file to calculate BMI. Gen: WD/WN, NAD Head: [place]/AT, No temporalis wasting.   Ear/Nose/Throat: Hearing grossly intact, nares w/o erythema or drainage, pinna without lesions Eyes: PER, EOMI, sclera nonicteric.  Neck: Supple, no gross masses.  No JVD.  Pulmonary:  Good air movement, no audible wheezing, no use of accessory muscles.  Cardiac: RRR, precordium not hyperdynamic. Vascular:  scattered varicosities present bilaterally.  Mild venous stasis changes to the legs bilaterally.  3-4+ soft pitting edema, CEAP [id number]  Vessel Right Left  Radial Palpable Palpable  Gastrointestinal: soft, non-distended. No guarding/no peritoneal signs.  Musculoskeletal: M/S 5/5 throughout.  No deformity.  Neurologic: CN 2-12 intact. Pain and light touch intact in extremities.  Symmetrical.  Speech is fluent. Motor exam as listed above. Psychiatric: Judgment intact, Mood & affect appropriate for pt's clinical situation. Dermatologic: Venous rashes no ulcers noted.  No changes consistent with cellulitis. Lymph : No lichenification or skin changes of chronic lymphedema.  CBC Lab Results  Component Value Date   WBC 5.9 01/12/2024   HGB 10.6 (L) 01/12/2024   HCT 31.9 (L) 01/12/2024   MCV 86.7 01/12/2024   PLT 273.0 01/12/2024    BMET    Component Value Date/Time   NA 138 01/12/2024 0939   NA 141 03/29/2023 0000   K 4.0 01/12/2024 0939   CL 100 01/12/2024 0939   CO2 30 01/12/2024 0939   GLUCOSE 111 (H) 01/12/2024 0939   BUN 19 01/12/2024 0939   BUN 19 03/29/2023 0000   CREATININE 1.62 (H) 01/12/2024 0939   CREATININE 1.07 (H) 04/17/2020 1018   CALCIUM  9.6 01/12/2024 0939   GFRNONAA 23 (L) 09/01/2022 0838   CrCl cannot be calculated (Patient's most recent lab result is older than the maximum 21 days allowed.).  COAG No results found for: INR, PROTIME  Radiology No results found.   Assessment/Plan There are no diagnoses linked to this encounter.   Cordella Shawl, MD  08/21/2024 10:40 AM      [1]  No outpatient medications have been marked as taking  for the 08/24/24 encounter (Appointment) with Shawl, Cordella MATSU, MD.  [2]  Social History Tobacco Use   Smoking status: Never   Smokeless tobacco: Never  Vaping Use   Vaping status: Never Used  Substance Use Topics   Alcohol  use: No    Alcohol /week: 0.0 standard drinks of alcohol     Comment: rarely   Drug use: No  [3] No Known Allergies  "

## 2024-08-24 ENCOUNTER — Ambulatory Visit (INDEPENDENT_AMBULATORY_CARE_PROVIDER_SITE_OTHER): Payer: Medicare Other | Admitting: Vascular Surgery

## 2024-08-24 ENCOUNTER — Telehealth (INDEPENDENT_AMBULATORY_CARE_PROVIDER_SITE_OTHER): Payer: Self-pay

## 2024-08-24 ENCOUNTER — Encounter (INDEPENDENT_AMBULATORY_CARE_PROVIDER_SITE_OTHER): Payer: Self-pay

## 2024-08-24 DIAGNOSIS — I10 Essential (primary) hypertension: Secondary | ICD-10-CM

## 2024-08-24 DIAGNOSIS — I2510 Atherosclerotic heart disease of native coronary artery without angina pectoris: Secondary | ICD-10-CM

## 2024-08-24 DIAGNOSIS — I890 Lymphedema, not elsewhere classified: Secondary | ICD-10-CM

## 2024-08-24 DIAGNOSIS — I872 Venous insufficiency (chronic) (peripheral): Secondary | ICD-10-CM

## 2024-08-24 DIAGNOSIS — E119 Type 2 diabetes mellitus without complications: Secondary | ICD-10-CM

## 2024-08-24 NOTE — Telephone Encounter (Signed)
 Patient would like you to call back for rescheduling

## 2024-09-11 ENCOUNTER — Ambulatory Visit (INDEPENDENT_AMBULATORY_CARE_PROVIDER_SITE_OTHER): Admitting: Vascular Surgery

## 2025-07-10 ENCOUNTER — Ambulatory Visit
